# Patient Record
Sex: Male | Born: 1960 | Race: Black or African American | Hispanic: No | Marital: Single | Smoking: Current every day smoker
Health system: Southern US, Community
[De-identification: ages and names within clinical notes are randomized; demographics above are authoritative.]

## PROBLEM LIST (undated history)

## (undated) DIAGNOSIS — I639 Cerebral infarction, unspecified: Secondary | ICD-10-CM

## (undated) DIAGNOSIS — K219 Gastro-esophageal reflux disease without esophagitis: Secondary | ICD-10-CM

## (undated) DIAGNOSIS — F1910 Other psychoactive substance abuse, uncomplicated: Secondary | ICD-10-CM

## (undated) DIAGNOSIS — F419 Anxiety disorder, unspecified: Secondary | ICD-10-CM

## (undated) DIAGNOSIS — E669 Obesity, unspecified: Secondary | ICD-10-CM

## (undated) DIAGNOSIS — F32A Depression, unspecified: Secondary | ICD-10-CM

## (undated) HISTORY — PX: KNEE SURGERY: SHX244

---

## 2015-11-03 DIAGNOSIS — M1710 Unilateral primary osteoarthritis, unspecified knee: Secondary | ICD-10-CM | POA: Insufficient documentation

## 2017-08-24 ENCOUNTER — Emergency Department
Admission: EM | Admit: 2017-08-24 | Discharge: 2017-08-24 | Disposition: A | Discharge status: Home / Self Care | Payer: Self-pay | Attending: Emergency Medicine | Admitting: Emergency Medicine

## 2017-08-24 ENCOUNTER — Encounter: Payer: Self-pay | Admitting: Emergency Medicine

## 2017-08-24 ENCOUNTER — Other Ambulatory Visit: Payer: Self-pay

## 2017-08-24 DIAGNOSIS — F17200 Nicotine dependence, unspecified, uncomplicated: Secondary | ICD-10-CM | POA: Insufficient documentation

## 2017-08-24 DIAGNOSIS — H6641 Suppurative otitis media, unspecified, right ear: Secondary | ICD-10-CM | POA: Insufficient documentation

## 2017-08-24 MED ORDER — AMOXICILLIN 500 MG PO TABS: 500.0000 mg | ORAL_TABLET | Freq: Three times a day (TID) | ORAL | 0 refills | Status: AC

## 2017-08-24 NOTE — ED Notes (Signed)
See triage note.  Presents with bilateral ear pain  Pain is mainly worse on the right  No fever

## 2017-08-24 NOTE — ED Provider Notes (Signed)
[facility] Emergency Department Provider Note  ____________________________________________  Time seen: Approximately 6:01 PM  I have reviewed the triage vital signs and the nursing notes.   HISTORY  Chief Complaint Otalgia    HPI Samuel Molina is a 57 y.o. male presents to the emergency department with right otalgia.  Patient reports purulent drainage from right ear.  Patient has no pain elicited with palpation of the tragus.  Patient reports that approximately 10 days ago he finished a course of amoxicillin after being diagnosed with otitis media.  Patient reports that he does not remember the dosage of amoxicillin but conveys that he took amoxicillin twice a day.  He reports that otalgia and hearing loss initially improved but returned after amoxicillin completion.  No prior history of chronic otitis media.  Patient has been afebrile.  No alleviating measures have been attempted.   History reviewed. No pertinent past medical history.  There are no active problems to display for this patient.   Past Surgical History:  Procedure Laterality Date  . KNEE SURGERY Right     Prior to Admission medications   Medication Sig Start Date End Date Taking? Authorizing Provider  amoxicillin (AMOXIL) 500 MG tablet Take 1 tablet (500 mg total) by mouth 3 (three) times daily for 10 days. 08/24/17 09/03/17  Lannie Fields, PA-C    Allergies Tylenol [acetaminophen]  No family history on file.  Social History Social History   Tobacco Use  . Smoking status: Current Every Day Smoker  . Smokeless tobacco: Never Used  Substance Use Topics  . Alcohol use: Not Currently  . Drug use: Not Currently     Review of Systems  Constitutional: No fever/chills Eyes: No visual changes. No discharge ENT: Patient has right otalgia.  Cardiovascular: no chest pain. Respiratory: no cough. No SOB. Gastrointestinal: No abdominal pain.  No nausea, no vomiting.  No diarrhea.   No constipation. Musculoskeletal: Negative for musculoskeletal pain. Skin: Negative for rash, abrasions, lacerations, ecchymosis. Neurological: Negative for headaches, focal weakness or numbness.  ____________________________________________   PHYSICAL EXAM:  VITAL SIGNS: ED Triage Vitals [08/24/17 1619]  Enc Vitals Group     BP 128/69     Pulse Rate 71     Resp 16     Temp 98.2 F (36.8 C)     Temp Source Oral     SpO2 97 %     Weight 270 lb (122.5 kg)     Height 6\' 2"  (1.88 m)     Head Circumference      Peak Flow      Pain Score 8     Pain Loc      Pain Edu?      Excl. in [place]?      Constitutional: Alert and oriented. Well appearing and in no acute distress. Eyes: Conjunctivae are normal. PERRL. EOMI. Head: Atraumatic. ENT:      Ears: Right tympanic membrane has purulent exudate and perforation.  Left tympanic membrane is effused.      Nose: No congestion/rhinnorhea.      Mouth/Throat: Mucous membranes are moist.  Cardiovascular: Normal rate, regular rhythm. Normal S1 and S2.  Good peripheral circulation. Respiratory: Normal respiratory effort without tachypnea or retractions. Lungs CTAB. Good air entry to the bases with no decreased or absent breath sounds. Musculoskeletal: Full range of motion to all extremities. No gross deformities appreciated. Neurologic:  Normal speech and language. No gross focal neurologic deficits are appreciated.  Skin:  Skin is warm,  dry and intact. No rash noted.  ____________________________________________   LABS (all labs ordered are listed, but only abnormal results are displayed)  Labs Reviewed - No data to display ____________________________________________  EKG   ____________________________________________  RADIOLOGY   No results found.  ____________________________________________    PROCEDURES  Procedure(s) performed:    Procedures    Medications - No data to  display   ____________________________________________   INITIAL IMPRESSION / ASSESSMENT AND PLAN / ED COURSE  Pertinent labs & imaging results that were available during my care of the patient were reviewed by me and considered in my medical decision making (see chart for details).  Review of the [place] CSRS was performed in accordance of the [place] prior to dispensing any controlled drugs.     Assessment and Plan: Right otitis media Differential included otitis media versus middle ear effusion. Patient presents to the emergency department with right otalgia and perceived hearing loss.  On physical exam, patient's right tympanic membrane was erythematous and effused consistent with otitis media.  I am concerned that patient was underdosed with prior treatment with amoxicillin.  I suggested a course of Augmentin and patient conveyed that he would rather try a repeated treatment with amoxicillin at  an appropriate dose.  Vital signs are reassuring prior to discharge..  Patient was discharged with amoxicillin and advised to follow-up with primary care    ____________________________________________  FINAL CLINICAL IMPRESSION(S) / ED DIAGNOSES  Final diagnoses:  Suppurative otitis media of right ear, unspecified chronicity      NEW MEDICATIONS STARTED DURING THIS VISIT:  ED Discharge Orders        Ordered    amoxicillin (AMOXIL) 500 MG tablet  3 times daily     08/24/17 1756          This chart was dictated using voice recognition software/Dragon. Despite best efforts to proofread, errors can occur which can change the meaning. Any change was purely unintentional.    Lannie Fields, PA-C 08/24/17 Rosina Lowenstein, MD 08/24/17 [phone]

## 2017-08-24 NOTE — ED Triage Notes (Signed)
Pt to ED via POV c/o bilateral ear pain, right worse than left. Pt states that ears have been hurting for about 1 week but worse over the past few days. Pt left eye has been red and itchy the last 2-3 days. Pt denies nasal congestion.

## 2017-09-04 ENCOUNTER — Emergency Department
Admission: EM | Admit: 2017-09-04 | Discharge: 2017-09-04 | Disposition: A | Discharge status: Home / Self Care | Payer: Self-pay | Attending: Emergency Medicine | Admitting: Emergency Medicine

## 2017-09-04 ENCOUNTER — Emergency Department: Payer: Self-pay

## 2017-09-04 ENCOUNTER — Other Ambulatory Visit: Payer: Self-pay

## 2017-09-04 DIAGNOSIS — K2100 Gastro-esophageal reflux disease with esophagitis, without bleeding: Secondary | ICD-10-CM

## 2017-09-04 DIAGNOSIS — K920 Hematemesis: Secondary | ICD-10-CM | POA: Insufficient documentation

## 2017-09-04 DIAGNOSIS — F17200 Nicotine dependence, unspecified, uncomplicated: Secondary | ICD-10-CM | POA: Insufficient documentation

## 2017-09-04 DIAGNOSIS — K922 Gastrointestinal hemorrhage, unspecified: Secondary | ICD-10-CM | POA: Insufficient documentation

## 2017-09-04 HISTORY — DX: Obesity, unspecified: E66.9

## 2017-09-04 HISTORY — DX: Gastro-esophageal reflux disease without esophagitis: K21.9

## 2017-09-04 LAB — COMPREHENSIVE METABOLIC PANEL
ALT: 25 U/L (ref 17–63)
AST: 26 U/L (ref 15–41)
Albumin: 3.7 g/dL (ref 3.5–5.0)
Alkaline Phosphatase: 90 U/L (ref 38–126)
Anion gap: 6 (ref 5–15)
BUN: 15 mg/dL (ref 6–20)
CHLORIDE: 105 mmol/L (ref 101–111)
CO2: 24 mmol/L (ref 22–32)
CREATININE: 0.81 mg/dL (ref 0.61–1.24)
Calcium: 8.7 mg/dL — ABNORMAL LOW (ref 8.9–10.3)
GFR calc non Af Amer: >60 mL/min (ref >60)
Glucose, Bld: 161 mg/dL — ABNORMAL HIGH (ref 65–99)
Potassium: 3.7 mmol/L (ref 3.5–5.1)
SODIUM: 135 mmol/L (ref 135–145)
TOTAL PROTEIN: 7.2 g/dL (ref 6.5–8.1)
Total Bilirubin: 0.8 mg/dL (ref 0.3–1.2)

## 2017-09-04 LAB — URINALYSIS, COMPLETE (UACMP) WITH MICROSCOPIC
Bilirubin Urine: NEGATIVE
GLUCOSE, UA: NEGATIVE mg/dL
KETONES UR: NEGATIVE mg/dL
Leukocytes, UA: NEGATIVE
NITRITE: NEGATIVE
PH: 5 (ref 5.0–8.0)
Protein, ur: NEGATIVE mg/dL
SPECIFIC GRAVITY, URINE: 1.026 (ref 1.005–1.030)
Squamous Epithelial / LPF: NONE SEEN (ref 0–5)

## 2017-09-04 LAB — CBC
HCT: 44.8 % (ref 40.0–52.0)
Hemoglobin: 14.8 g/dL (ref 13.0–18.0)
MCH: 26.6 pg (ref 26.0–34.0)
MCHC: 32.9 g/dL (ref 32.0–36.0)
MCV: 80.9 fL (ref 80.0–100.0)
PLATELETS: 271 10*3/uL (ref 150–440)
RBC: 5.54 MIL/uL (ref 4.40–5.90)
RDW: 14.6 % — AB (ref 11.5–14.5)
WBC: 8.7 10*3/uL (ref 3.8–10.6)

## 2017-09-04 LAB — LIPASE, BLOOD: LIPASE: 30 U/L (ref 11–51)

## 2017-09-04 LAB — TROPONIN I: Troponin I: <0.03 ng/mL (ref <0.03)

## 2017-09-04 MED ORDER — GI COCKTAIL ~~LOC~~
30.0000 mL | Freq: Once | ORAL | Status: AC
Start: 2017-09-04 — End: 2017-09-04
  Administered 2017-09-04: 30 mL via ORAL
  Filled 2017-09-04: qty 30

## 2017-09-04 MED ORDER — ONDANSETRON 4 MG PO TBDP: ORAL_TABLET | ORAL | 0 refills | Status: DC

## 2017-09-04 MED ORDER — SUCRALFATE 1 G PO TABS: 1.0000 g | ORAL_TABLET | Freq: Four times a day (QID) | ORAL | 1 refills | Status: DC | PRN

## 2017-09-04 MED ORDER — OMEPRAZOLE MAGNESIUM 20 MG PO TBEC: 20.0000 mg | DELAYED_RELEASE_TABLET | Freq: Two times a day (BID) | ORAL | 2 refills | Status: DC

## 2017-09-04 NOTE — Discharge Instructions (Addendum)
As we discussed, your work-up was generally reassuring although he does appear that you have a small amount of blood passing through your stool, either from the small amount of vomiting blood that you did today or from the irritation and inflammation in your esophagus and/or stomach.  We recommend that he follow-up the next available opportunity with a GI doctor such as Dr. Vicente Males; I sent him a message through the computer so that he would know that you but you can still call the clinic and schedule the next available follow-up appointment.  He would likely benefit from an upper endoscopy where they look down your throat with a camera to evaluate directly how your esophagus looks.  Return to the emergency department if you develop new or worsening symptoms that concern you.

## 2017-09-04 NOTE — ED Provider Notes (Addendum)
[facility] Emergency Department Provider Note  ____________________________________________   First MD Initiated Contact with Patient 09/04/17 1651     (approximate)  I have reviewed the triage vital signs and the nursing notes.   HISTORY  Chief Complaint Gastroesophageal Reflux and Hematemesis    HPI Samuel Molina is a 57 y.o. male with medical history as listed below who reports suffering from acid reflux for years who presents for evaluation of persistent acid reflux, nightly vomiting, and today with acute onset of some bright red blood in the emesis.  He reports that for at least a week he has been vomiting 4 or 5 times a night.  He states that no matter how much he eats or what he eats he always vomits a relatively small amount of food but "a lot of acid".  He reports that nothing in particular makes his symptoms better or worse.  He saw a GI doctor when he lived in [place] but he has moved down here about a year and half ago and has no local doctor.  He used to take Prilosec several times a day but says that it did not work so he does not take any medication currently.  He is frustrated at the persistence of the symptoms and the severity of them but the thing that caused him to come in today was seeing some of what he assumed was blood in the emesis.  He denies fever/chills, chest pain, shortness of breath, lower abdominal pain, and dysuria.  He denies bright red blood in his stools and denies dark and tarry stools.  Past Medical History:  Diagnosis Date  . GERD (gastroesophageal reflux disease)   . Obesity     There are no active problems to display for this patient.   Past Surgical History:  Procedure Laterality Date  . KNEE SURGERY Right     Prior to Admission medications   Medication Sig Start Date End Date Taking? Authorizing Provider  omeprazole (PRILOSEC OTC) 20 MG tablet Take 1 tablet (20 mg total) by mouth 2 (two) times daily.  09/04/17 09/04/18  Hinda Kehr, MD  ondansetron (ZOFRAN ODT) 4 MG disintegrating tablet Allow 1-2 tablets to dissolve in your mouth every 8 hours as needed for nausea/vomiting 09/04/17   Hinda Kehr, MD  sucralfate (CARAFATE) 1 g tablet Take 1 tablet (1 g total) by mouth 4 (four) times daily as needed (for abdominal discomfort, nausea, and/or vomiting). 09/04/17   Hinda Kehr, MD    Allergies Tylenol [acetaminophen]  History reviewed. No pertinent family history.  Social History Social History   Tobacco Use  . Smoking status: Current Every Day Smoker  . Smokeless tobacco: Never Used  Substance Use Topics  . Alcohol use: Not Currently  . Drug use: Not Currently    Review of Systems Constitutional: No fever/chills Eyes: No visual changes. ENT: No sore throat. Cardiovascular: Denies chest pain. Respiratory: Denies shortness of breath. Gastrointestinal: About a week of nightly emesis (4-5 times) in the setting of years of acid reflux.  Acute onset of a small amount of bright red blood in the emesis today Genitourinary: Negative for dysuria. Musculoskeletal: Negative for neck pain.  Negative for back pain. Integumentary: Negative for rash. Neurological: Negative for headaches, focal weakness or numbness.   ____________________________________________   PHYSICAL EXAM:  VITAL SIGNS: ED Triage Vitals [09/04/17 1612]  Enc Vitals Group     BP 134/64     Pulse Rate 80     Resp  18     Temp 97.7 F (36.5 C)     Temp Source Oral     SpO2 98 %     Weight 122.5 kg (270 lb)     Height 1.88 m (6\' 2" )     Head Circumference      Peak Flow      Pain Score 8     Pain Loc      Pain Edu?      Excl. in [place]?     Constitutional: Alert and oriented. Well appearing and in no acute distress. Eyes: Conjunctivae are normal.  Head: Atraumatic. Nose: No congestion/rhinnorhea. Mouth/Throat: Mucous membranes are moist. Neck: No stridor.  No meningeal signs.   Cardiovascular: Normal  rate, regular rhythm. Good peripheral circulation. Grossly normal heart sounds. Respiratory: Normal respiratory effort.  No retractions. Lungs CTAB. Gastrointestinal: Obese.  Soft and nontender including in the epigastrium. No distention.   Rectal: Normal external exam.  Light brown stool present in rectal vault.  No gross blood, but Hemoccult was weakly positive. Musculoskeletal: No lower extremity tenderness nor edema. No gross deformities of extremities. Neurologic:  Normal speech and language. No gross focal neurologic deficits are appreciated.  Skin:  Skin is warm, dry and intact. No rash noted. Psychiatric: Mood and affect are normal. Speech and behavior are normal.  ____________________________________________   LABS (all labs ordered are listed, but only abnormal results are displayed)  Labs Reviewed  COMPREHENSIVE METABOLIC PANEL - Abnormal; Notable for the following components:      Result Value   Glucose, Bld 161 (*)    Calcium 8.7 (*)    All other components within normal limits  CBC - Abnormal; Notable for the following components:   RDW 14.6 (*)    All other components within normal limits  URINALYSIS, COMPLETE (UACMP) WITH MICROSCOPIC - Abnormal; Notable for the following components:   Color, Urine YELLOW (*)    APPearance CLEAR (*)    Hgb urine dipstick SMALL (*)    Bacteria, UA RARE (*)    All other components within normal limits  LIPASE, BLOOD  TROPONIN I   ____________________________________________  EKG  ED ECG REPORT I, Hinda Kehr, the attending physician, personally viewed and interpreted this ECG.  Date: 09/04/2017 EKG Time: 16: 18 Rate: 73 Rhythm: normal sinus rhythm QRS Axis: normal Intervals: Left anterior fascicular block ST/T Wave abnormalities: T wave inversions in leads V5, V6, II, and III. Narrative Interpretation: Does not meet STEMI criteria, doubt ACS   ____________________________________________  RADIOLOGY I, Hinda Kehr,  personally viewed and evaluated these images (plain radiographs) as part of my medical decision making, as well as reviewing the written report by the radiologist.  ED MD interpretation:  No acute abnormalities on acute abdomen series  Official radiology report(s): Dg Abdomen Acute W/chest  Result Date: 09/04/2017 CLINICAL DATA:  Esophageal pain with vomiting EXAM: DG ABDOMEN ACUTE W/ 1V CHEST COMPARISON:  None. FINDINGS: Single-view chest demonstrates no acute consolidation or effusion. Normal heart size. No pneumothorax. Supine and upright views of the abdomen demonstrate no free air beneath the diaphragm. Nonobstructed bowel-gas pattern with moderate stool. Probable phleboliths in the left pelvis IMPRESSION: 1. No radiographic evidence for acute cardiopulmonary abnormality. 2. Nonobstructed bowel-gas pattern Electronically Signed   By: Donavan Foil M.D.   On: 09/04/2017 18:20    ____________________________________________   PROCEDURES  Critical Care performed: No   Procedure(s) performed:   Procedures   ____________________________________________   INITIAL IMPRESSION / ASSESSMENT AND PLAN /  ED COURSE  As part of my medical decision making, I reviewed the following data within the [place] notes reviewed and incorporated, Labs reviewed , EKG interpreted , Radiograph reviewed  and Notes from prior ED visits    Differential diagnosis includes, but is not limited to, esophagitis, gastric or duodenal ulcers, SBO/ileus.  The patient has no abdominal tenderness to palpation which is reassuring.  Most of his symptoms are at night.  He had a small amount of blood in his emesis today in the setting of multiple episodes of emesis over the last week which most likely represents a Mallory-Weiss tear or esophagitis with some bleeding.  He has weakly positive stool but no gross blood per rectum.  I do not think a CT scan would be a useful study tonight.  He is also  having hiccups which he says is very common for him.  I will obtain an acute abdomen series of radiographs to look for any acute abnormalities, masses, evidence of air-fluid levels, etc., but I suspect it will be normal.  I tried to order a radiograph series of the esophagus but the radiologist is no longer available and has to be present in order to administer the contrast material.  I am giving the patient a GI cocktail which he says has worked in the past.  I suspect if we start him back on a PPI as well as Carafate and try to arrange close outpatient follow-up with a GI doctor such as Dr. Vicente Males, that would probably be the best thing for the patient because he will most likely need an upper endoscopy.  He agrees with the plan.  Clinical Course as of Sep 05 1847  Tue Sep 04, 2017  1842 The patient states he feels quite a bit better after the GI cocktail.  He is watching TV in no apparent discomfort.  His radiographs of chest and abdomen showed no evidence of any acute abnormalities.I discussed with him the need for outpatient follow-up and told him I was going to prescribe a couple different medications (Prilosec OTC twice daily and Carafate according to label instructions).  I encouraged him to follow-up with GI and will send to Dr. Vicente Males a message through [facility] to help facilitate follow-up.  I gave my usual and customary return precautions.  He understands and agrees with the plan.  Also providing a prescription for Zofran.   [CF]    Clinical Course User Index [CF] Hinda Kehr, MD    ____________________________________________  FINAL CLINICAL IMPRESSION(S) / ED DIAGNOSES  Final diagnoses:  Gastroesophageal reflux disease with esophagitis  Gastrointestinal hemorrhage, unspecified gastrointestinal hemorrhage type     MEDICATIONS GIVEN DURING THIS VISIT:  Medications  gi cocktail (Maalox,Lidocaine,Donnatal) (30 mLs Oral Given 09/04/17 1801)     ED Discharge Orders        Ordered     ondansetron (ZOFRAN ODT) 4 MG disintegrating tablet     09/04/17 1847    sucralfate (CARAFATE) 1 g tablet  4 times daily PRN     09/04/17 1847    omeprazole (PRILOSEC OTC) 20 MG tablet  2 times daily     09/04/17 1847       Note:  This document was prepared using Dragon voice recognition software and may include unintentional dictation errors.    Hinda Kehr, MD 09/04/17 1062    Hinda Kehr, MD 09/04/17 [phone]

## 2017-09-04 NOTE — ED Triage Notes (Signed)
To ER via POV c/o acid reflux. Hx of same. States that he had emesis today with bright red fluid in emesis.   Pt alert and oriented X4, active, cooperative, pt in NAD. RR even and unlabored, color WNL.

## 2017-09-19 ENCOUNTER — Ambulatory Visit: Payer: Self-pay | Admitting: Gastroenterology

## 2017-09-19 ENCOUNTER — Encounter: Payer: Self-pay | Admitting: Gastroenterology

## 2017-09-19 ENCOUNTER — Other Ambulatory Visit: Payer: Self-pay

## 2017-09-19 VITALS — BP 133/75 | HR 84 | Resp 17 | Ht 73.0 in | Wt 275.6 lb

## 2017-09-19 DIAGNOSIS — K219 Gastro-esophageal reflux disease without esophagitis: Secondary | ICD-10-CM

## 2017-09-19 MED ORDER — OMEPRAZOLE MAGNESIUM 20 MG PO TBEC: 40.0000 mg | DELAYED_RELEASE_TABLET | Freq: Two times a day (BID) | ORAL | 3 refills | Status: DC

## 2017-09-19 MED ORDER — SUCRALFATE 1 GM/10ML PO SUSP: 2.0000 g | Freq: Four times a day (QID) | ORAL | 1 refills | Status: DC

## 2017-09-19 NOTE — Patient Instructions (Addendum)
Food Choices for Gastroesophageal Reflux Disease, Adult When you have gastroesophageal reflux disease (GERD), the foods you eat and your eating habits are very important. Choosing the right foods can help ease the discomfort of GERD. Consider working with a diet and nutrition specialist (dietitian) to help you make healthy food choices. What general guidelines should I follow? Eating plan  Choose healthy foods low in fat, such as fruits, vegetables, whole grains, low-fat dairy products, and lean meat, fish, and poultry.  Eat frequent, small meals instead of three large meals each day. Eat your meals slowly, in a relaxed setting. Avoid bending over or lying down until 2-3 hours after eating.  Limit high-fat foods such as fatty meats or fried foods.  Limit your intake of oils, butter, and shortening to less than 8 teaspoons each day.  Avoid the following: ? Foods that cause symptoms. These may be different for different people. Keep a food diary to keep track of foods that cause symptoms. ? Alcohol. ? Drinking large amounts of liquid with meals. ? Eating meals during the 2-3 hours before bed.  Cook foods using methods other than frying. This may include baking, grilling, or broiling. Lifestyle   Maintain a healthy weight. Ask your health care provider what weight is healthy for you. If you need to lose weight, work with your health care provider to do so safely.  Exercise for at least 30 minutes on 5 or more days each week, or as told by your health care provider.  Avoid wearing clothes that fit tightly around your waist and chest.  Do not use any products that contain nicotine or tobacco, such as cigarettes and e-cigarettes. If you need help quitting, ask your health care provider.  Sleep with the head of your bed raised. Use a wedge under the mattress or blocks under the bed frame to raise the head of the bed. What foods are not recommended? The items listed may not be a complete  list. Talk with your dietitian about what dietary choices are best for you. Grains Pastries or quick breads with added fat. French toast. Vegetables Deep fried vegetables. French fries. Any vegetables prepared with added fat. Any vegetables that cause symptoms. For some people this may include tomatoes and tomato products, chili peppers, onions and garlic, and horseradish. Fruits Any fruits prepared with added fat. Any fruits that cause symptoms. For some people this may include citrus fruits, such as oranges, grapefruit, pineapple, and lemons. Meats and other protein foods High-fat meats, such as fatty beef or pork, hot dogs, ribs, ham, sausage, salami and bacon. Fried meat or protein, including fried fish and fried chicken. Nuts and nut butters. Dairy Whole milk and chocolate milk. Sour cream. Cream. Ice cream. Cream cheese. Milk shakes. Beverages Coffee and tea, with or without caffeine. Carbonated beverages. Sodas. Energy drinks. Fruit juice made with acidic fruits (such as orange or grapefruit). Tomato juice. Alcoholic drinks. Fats and oils Butter. Margarine. Shortening. Ghee. Sweets and desserts Chocolate and cocoa. Donuts. Seasoning and other foods Pepper. Peppermint and spearmint. Any condiments, herbs, or seasonings that cause symptoms. For some people, this may include curry, hot sauce, or vinegar-based salad dressings. Summary  When you have gastroesophageal reflux disease (GERD), food and lifestyle choices are very important to help ease the discomfort of GERD.  Eat frequent, small meals instead of three large meals each day. Eat your meals slowly, in a relaxed setting. Avoid bending over or lying down until 2-3 hours after eating.  Limit high-fat   foods such as fatty meat or fried foods. This information is not intended to replace advice given to you by your health care provider. Make sure you discuss any questions you have with your health care provider. Document Released:  04/10/2005 Document Revised: 04/11/2016 Document Reviewed: 04/11/2016 Elsevier Interactive Patient Education  2018 [employer].  Lifestyle and home remedies Lifestyle changes may help reduce the frequency of acid reflux. Try to: Maintain a healthy weight. Excess pounds put pressure on your abdomen, pushing up your stomach and causing acid to reflux into your esophagus.  Stop smoking. Smoking decreases the lower esophageal sphincter's ability to function properly.  Elevate the head of your bed. If you regularly experience heartburn while trying to sleep, place wood or cement blocks under the feet of your bed so that the head end is raised by 6 to 9 inches. If you can't elevate your bed, you can insert a wedge between your mattress and box spring to elevate your body from the waist up. Raising your head with additional pillows isn't effective.  Don't lie down after a meal. Wait at least three hours after eating before lying down or going to bed.  Eat food slowly and chew thoroughly. Put down your fork after every bite and pick it up again once you have chewed and swallowed that bite.  Avoid foods and drinks that trigger reflux. Common triggers include fatty or fried foods, tomato sauce, alcohol, chocolate, mint, garlic, onion, and caffeine.  Avoid tight-fitting clothing. Clothes that fit tightly around your waist put pressure on your abdomen and the lower esophageal sphincter.

## 2017-09-19 NOTE — Progress Notes (Signed)
Cephas Darby, MD [address]  [place]  [place], [place] [address]  Main: [phone]  Fax: [phone]    Gastroenterology Consultation  Referring Provider:     No ref. provider found Primary Care Physician:  Patient, No Pcp Per Primary Gastroenterologist:  Dr. Cephas Darby Reason for Consultation:     Regurgitation and acid reflux        HPI:   Samuel Molina is a 57 y.o. male referred from [facility] ER  for consultation & management of regurgitation and acid reflux. Patient reports that he has been experiencing severe burning pain in the chest associated with constant hiccups, regurgitation of acid, and unable to tolerate anything by mouth. Symptoms are worse after eating and when he lays down. He hasn't slept in last 72 hours because of these symptoms. He went to ER due to these symptoms as well as noticing some bright red blood in the emesis. His emesis is mostly acid and less amount of food. He was discharged on omeprazole 20 mg twice a day and sucralfate, Zofran. He reports that he had similar symptoms about one year ago and was seeing a gastroenterologist in [place]. Symptoms have recurred in last 1 month. He has gained about 40 pounds in last 10 months. He consumes red meat, fried foods regularly. He hasn't been taking any acid suppression medication in last 1 year until recently because he is asymptomatic. He also reports trouble swallowing. He smokes half pack of cigarettes per day since age of 82  He denies drinking alcohol, he works during nights for [employer]  His single, has children  in the ER troponin was negative, EKG normal, CBC, CMP, lipase were normal  NSAIDs: none  Antiplts/Anticoagulants/Anti thrombotics: none  GI Procedures: none He denies family history of GI malignancy  Past Medical History:  Diagnosis Date  . GERD (gastroesophageal reflux disease)   . Obesity     Past Surgical History:  Procedure Laterality  Date  . KNEE SURGERY Right     Current Outpatient Medications:  .  omeprazole (PRILOSEC OTC) 20 MG tablet, Take 2 tablets (40 mg total) by mouth 2 (two) times daily before a meal., Disp: 120 tablet, Rfl: 3 .  ondansetron (ZOFRAN ODT) 4 MG disintegrating tablet, Allow 1-2 tablets to dissolve in your mouth every 8 hours as needed for nausea/vomiting, Disp: 30 tablet, Rfl: 0 .  sucralfate (CARAFATE) 1 GM/10ML suspension, Take 20 mLs (2 g total) by mouth 4 (four) times daily., Disp: 420 mL, Rfl: 1   No family history on file.   Social History   Tobacco Use  . Smoking status: Current Every Day Smoker  . Smokeless tobacco: Never Used  Substance Use Topics  . Alcohol use: Not Currently  . Drug use: Not Currently    Allergies as of 09/19/2017 - Review Complete 09/19/2017  Allergen Reaction Noted  . Tylenol [acetaminophen] Rash 08/24/2017    Review of Systems:    All systems reviewed and negative except where noted in HPI.   Physical Exam:  BP 133/75 (BP Location: Left Arm, Patient Position: Sitting, Cuff Size: Large)   Pulse 84   Resp 17   Ht 6\' 1"  (1.854 m)   Wt 275 lb 9.6 oz (125 kg)   BMI 36.36 kg/m  No LMP for male patient.  General:   Alert,  Well-developed, well-nourished, pleasant and cooperative in NAD Head:  Normocephalic and atraumatic. Eyes:  Sclera clear, no icterus.  Conjunctiva pink. Ears:  Normal auditory acuity. Nose:  No deformity, discharge, or lesions. Mouth:  No deformity or lesions,oropharynx pink & moist. Neck:  Supple; no masses or thyromegaly. Lungs:  Respirations even and unlabored.  Clear throughout to auscultation.   No wheezes, crackles, or rhonchi. No acute distress. Heart:  Regular rate and rhythm; no murmurs, clicks, rubs, or gallops. Abdomen:  Normal bowel sounds. Soft, obese, non-tender and non-distended without masses, hepatosplenomegaly or hernias noted.  No guarding or rebound tenderness.   Rectal: Not performed Msk:  Symmetrical without  gross deformities. Good, equal movement & strength bilaterally. Pulses:  Normal pulses noted. Extremities:  No clubbing or edema.  No cyanosis. Neurologic:  Alert and oriented x3;  grossly normal neurologically. Skin:  Intact without significant lesions or rashes. No jaundice. Lymph Nodes:  No significant cervical adenopathy. Psych:  Alert and cooperative. Normal mood and affect.  Imaging Studies: X-ray abdomen normal  Assessment and Plan:   [name] is a 57 y.o. African-American male with morbid obesity, with typical symptoms of GERD, worse in last 1 month  - Omeprazole 40 mg twice a day - Educated him about antireflux measures including lifestyle changes, elevate HOB, weight loss, avoiding red meat and other high carbohydrate foods, quit smoking - EGD tomorrow   Follow up in 2 weeks   Cephas Darby, MD

## 2017-09-20 ENCOUNTER — Ambulatory Visit: Payer: Self-pay | Admitting: Anesthesiology

## 2017-09-20 ENCOUNTER — Ambulatory Visit
Admission: RE | Admit: 2017-09-20 | Discharge: 2017-09-20 | Disposition: A | Discharge status: Home / Self Care | Payer: Self-pay | Source: Ambulatory Visit | Attending: Gastroenterology | Admitting: Gastroenterology

## 2017-09-20 ENCOUNTER — Encounter
Admission: RE | Disposition: A | Discharge status: Home / Self Care | Payer: Self-pay | Source: Ambulatory Visit | Attending: Gastroenterology

## 2017-09-20 ENCOUNTER — Encounter: Payer: Self-pay | Admitting: *Deleted

## 2017-09-20 DIAGNOSIS — K2950 Unspecified chronic gastritis without bleeding: Secondary | ICD-10-CM | POA: Insufficient documentation

## 2017-09-20 DIAGNOSIS — K2100 Gastro-esophageal reflux disease with esophagitis, without bleeding: Secondary | ICD-10-CM

## 2017-09-20 DIAGNOSIS — E669 Obesity, unspecified: Secondary | ICD-10-CM | POA: Insufficient documentation

## 2017-09-20 DIAGNOSIS — F17210 Nicotine dependence, cigarettes, uncomplicated: Secondary | ICD-10-CM | POA: Insufficient documentation

## 2017-09-20 DIAGNOSIS — Z886 Allergy status to analgesic agent status: Secondary | ICD-10-CM | POA: Insufficient documentation

## 2017-09-20 DIAGNOSIS — Z79899 Other long term (current) drug therapy: Secondary | ICD-10-CM | POA: Insufficient documentation

## 2017-09-20 DIAGNOSIS — Z6835 Body mass index (BMI) 35.0-35.9, adult: Secondary | ICD-10-CM | POA: Insufficient documentation

## 2017-09-20 DIAGNOSIS — K3189 Other diseases of stomach and duodenum: Secondary | ICD-10-CM | POA: Insufficient documentation

## 2017-09-20 DIAGNOSIS — R12 Heartburn: Secondary | ICD-10-CM | POA: Insufficient documentation

## 2017-09-20 DIAGNOSIS — B9681 Helicobacter pylori [H. pylori] as the cause of diseases classified elsewhere: Secondary | ICD-10-CM | POA: Insufficient documentation

## 2017-09-20 DIAGNOSIS — K228 Other specified diseases of esophagus: Secondary | ICD-10-CM | POA: Insufficient documentation

## 2017-09-20 DIAGNOSIS — K219 Gastro-esophageal reflux disease without esophagitis: Secondary | ICD-10-CM

## 2017-09-20 HISTORY — PX: ESOPHAGOGASTRODUODENOSCOPY (EGD) WITH PROPOFOL: SHX5813

## 2017-09-20 SURGERY — ESOPHAGOGASTRODUODENOSCOPY (EGD) WITH PROPOFOL
Anesthesia: General

## 2017-09-20 MED ORDER — IPRATROPIUM-ALBUTEROL 0.5-2.5 (3) MG/3ML IN SOLN
3.0000 mL | Freq: Once | RESPIRATORY_TRACT | Status: AC
  Administered 2017-09-20: 3 mL via RESPIRATORY_TRACT

## 2017-09-20 MED ORDER — LIDOCAINE HCL (PF) 2 % IJ SOLN
INTRAMUSCULAR | Status: AC
  Filled 2017-09-20: qty 10

## 2017-09-20 MED ORDER — IPRATROPIUM-ALBUTEROL 0.5-2.5 (3) MG/3ML IN SOLN
RESPIRATORY_TRACT | Status: AC
  Administered 2017-09-20: 3 mL via RESPIRATORY_TRACT
  Filled 2017-09-20: qty 3

## 2017-09-20 MED ORDER — SODIUM CHLORIDE 0.9 % IV SOLN
INTRAVENOUS | Status: DC
  Administered 2017-09-20: 10:00:00 via INTRAVENOUS

## 2017-09-20 MED ORDER — GLYCOPYRROLATE 0.2 MG/ML IJ SOLN
INTRAMUSCULAR | Status: AC
  Filled 2017-09-20: qty 1

## 2017-09-20 MED ORDER — PROPOFOL 500 MG/50ML IV EMUL
INTRAVENOUS | Status: AC
Start: 2017-09-20 — End: ?
  Filled 2017-09-20: qty 50

## 2017-09-20 MED ORDER — PROPOFOL 500 MG/50ML IV EMUL
INTRAVENOUS | Status: DC | PRN
  Administered 2017-09-20: 100 ug/kg/min via INTRAVENOUS

## 2017-09-20 MED ORDER — PROPOFOL 10 MG/ML IV BOLUS
INTRAVENOUS | Status: DC | PRN
  Administered 2017-09-20: 150 mg via INTRAVENOUS

## 2017-09-20 MED ORDER — GLYCOPYRROLATE 0.2 MG/ML IJ SOLN
INTRAMUSCULAR | Status: DC | PRN
  Administered 2017-09-20: 0.1 mg via INTRAVENOUS

## 2017-09-20 MED ORDER — LIDOCAINE HCL (CARDIAC) PF 100 MG/5ML IV SOSY
PREFILLED_SYRINGE | INTRAVENOUS | Status: DC | PRN
  Administered 2017-09-20 (×2): 50 mg via INTRAVENOUS

## 2017-09-20 NOTE — Anesthesia Postprocedure Evaluation (Signed)
Anesthesia Post Note  Patient: Avraj Lindroth  Procedure(s) Performed: ESOPHAGOGASTRODUODENOSCOPY (EGD) WITH PROPOFOL (N/A )  Patient location during evaluation: Endoscopy Anesthesia Type: General Level of consciousness: awake and alert, oriented and patient cooperative Pain management: satisfactory to patient Vital Signs Assessment: post-procedure vital signs reviewed and stable Respiratory status: spontaneous breathing and respiratory function stable Cardiovascular status: blood pressure returned to baseline and stable Postop Assessment: no headache, no backache, patient able to bend at knees, no apparent nausea or vomiting, adequate PO intake and able to ambulate Anesthetic complications: no     Last Vitals:  Vitals:   09/20/17 0956 09/20/17 1110  BP: (!) 125/94 107/69  Pulse: 74 74  Resp: 20 20  Temp: (!) 36.2 C (!) 36.1 C  SpO2: 100% 100%    Last Pain:  Vitals:   09/20/17 1110  TempSrc: Tympanic  PainSc: Asleep                 Myndi Wamble H Maxime Beckner

## 2017-09-20 NOTE — Transfer of Care (Signed)
Immediate Anesthesia Transfer of Care Note  Patient: Samuel Molina  Procedure(s) Performed: ESOPHAGOGASTRODUODENOSCOPY (EGD) WITH PROPOFOL (N/A )  Patient Location: Endoscopy Unit  Anesthesia Type:General  Level of Consciousness: awake, alert , oriented and patient cooperative  Airway & Oxygen Therapy: Patient Spontanous Breathing and Patient connected to nasal cannula oxygen  Post-op Assessment: Report given to RN, Post -op Vital signs reviewed and stable and Patient moving all extremities  Post vital signs: Reviewed and stable  Last Vitals:  Vitals Value Taken Time  BP 107/69 09/20/2017 11:11 AM  Temp    Pulse 61 09/20/2017 11:13 AM  Resp 10 09/20/2017 11:13 AM  SpO2 99 % 09/20/2017 11:13 AM  Vitals shown include unvalidated device data.  Last Pain:  Vitals:   09/20/17 1110  TempSrc: (P) Tympanic  PainSc:          Complications: No apparent anesthesia complications

## 2017-09-20 NOTE — Op Note (Signed)
[facility] Gastroenterology Patient Name: Samuel Molina Procedure Date: 09/20/2017 10:46 AM MRN: [id number] Account #: [id number] Date of Birth: [birth year] Admit Type: Outpatient Age: 57 Room: [facility] ENDO ROOM 2 Gender: Male Note Status: Finalized Procedure:            Upper GI endoscopy Indications:          Heartburn, Suspected gastro-esophageal reflux disease Providers:            Lin Landsman MD, MD Referring MD:         No Local Md, MD (Referring MD) Medicines:            Monitored Anesthesia Care Complications:        No immediate complications. Estimated blood loss: None. Procedure:            Pre-Anesthesia Assessment:                       - Prior to the procedure, a History and Physical was                        performed, and patient medications and allergies were                        reviewed. The patient is competent. The risks and                        benefits of the procedure and the sedation options and                        risks were discussed with the patient. All questions                        were answered and informed consent was obtained.                        Patient identification and proposed procedure were                        verified by the physician, the nurse, the                        anesthesiologist, the anesthetist and the technician in                        the pre-procedure area in the procedure room in the                        endoscopy suite. Mental Status Examination: alert and                        oriented. Airway Examination: normal oropharyngeal                        airway and neck mobility. Respiratory Examination:                        clear to auscultation. CV Examination: normal.                        Prophylactic Antibiotics: The patient does not require  prophylactic antibiotics. Prior Anticoagulants: The                        patient has taken no previous  anticoagulant or                        antiplatelet agents. ASA Grade Assessment: III - A                        patient with severe systemic disease. After reviewing                        the risks and benefits, the patient was deemed in                        satisfactory condition to undergo the procedure. The                        anesthesia plan was to use monitored anesthesia care                        (MAC). Immediately prior to administration of                        medications, the patient was re-assessed for adequacy                        to receive sedatives. The heart rate, respiratory rate,                        oxygen saturations, blood pressure, adequacy of                        pulmonary ventilation, and response to care were                        monitored throughout the procedure. The physical status                        of the patient was re-assessed after the procedure.                       After obtaining informed consent, the endoscope was                        passed under direct vision. Throughout the procedure,                        the patient's blood pressure, pulse, and oxygen                        saturations were monitored continuously. The                        Colonoscope was introduced through the mouth, and                        advanced to the second part of duodenum. The upper GI  endoscopy was accomplished without difficulty. The                        patient tolerated the procedure well. Findings:      The duodenal bulb and second portion of the duodenum were normal.      The entire examined stomach was normal. Biopsies were taken with a cold       forceps for Helicobacter pylori testing.      LA Grade B (one or more mucosal breaks greater than 5 mm, not extending       between the tops of two mucosal folds) esophagitis with no bleeding was       found in the lower third of the esophagus.      The Z-line was  irregular. Impression:           - Normal duodenal bulb and second portion of the                        duodenum.                       - Normal stomach. Biopsied.                       - LA Grade B reflux esophagitis.                       - Z-line irregular. Recommendation:       - Await pathology results.                       - Discharge patient to home.                       - High fiber diet and low fat diet.                       - Follow an antireflux regimen.                       - Use Prilosec (omeprazole) 40 mg PO BID for 3 months.                       - Return to my office as previously scheduled. Procedure Code(s):    --- Professional ---                       [phone], Esophagogastroduodenoscopy, flexible, transoral;                        with biopsy, single or multiple Diagnosis Code(s):    --- Professional ---                       K21.0, Gastro-esophageal reflux disease with esophagitis                       K22.8, Other specified diseases of esophagus                       R12, Heartburn CPT copyright 2017 American Medical Association. All rights reserved. The codes documented in this report are preliminary and upon coder review may  be revised to meet current compliance  requirements. Dr. Ulyess Mort Lin Landsman MD, MD 09/20/2017 11:11:02 AM This report has been signed electronically. Number of Addenda: 0 Note Initiated On: 09/20/2017 10:46 AM      [facility]

## 2017-09-20 NOTE — Anesthesia Preprocedure Evaluation (Signed)
Anesthesia Evaluation  Patient identified by MRN, date of birth, ID band Patient awake    Reviewed: Allergy & Precautions, H&P , NPO status , Patient's Chart, lab work & pertinent test results  History of Anesthesia Complications Negative for: history of anesthetic complications  Airway Mallampati: III  TM Distance: <3 FB Neck ROM: full    Dental  (+) Chipped, Poor Dentition, Missing   Pulmonary neg shortness of breath, Current Smoker,  Signs and symptoms suggestive of sleep apnea            Cardiovascular Exercise Tolerance: Good (-) angina(-) Past MI negative cardio ROS       Neuro/Psych negative neurological ROS  negative psych ROS   GI/Hepatic Neg liver ROS, GERD  Medicated and Controlled,  Endo/Other  negative endocrine ROS  Renal/GU negative Renal ROS  negative genitourinary   Musculoskeletal   Abdominal   Peds  Hematology negative hematology ROS (+)   Anesthesia Other Findings Past Medical History: No date: GERD (gastroesophageal reflux disease) No date: Obesity  Past Surgical History: No date: KNEE SURGERY; Right  BMI    Body Mass Index:  35.89 kg/m      Reproductive/Obstetrics negative OB ROS                             Anesthesia Physical Anesthesia Plan  ASA: III  Anesthesia Plan: General   Post-op Pain Management:    Induction: Intravenous  PONV Risk Score and Plan: Propofol infusion and TIVA  Airway Management Planned: Natural Airway and Nasal Cannula  Additional Equipment:   Intra-op Plan:   Post-operative Plan:   Informed Consent: I have reviewed the patients History and Physical, chart, labs and discussed the procedure including the risks, benefits and alternatives for the proposed anesthesia with the patient or authorized representative who has indicated his/her understanding and acceptance.   Dental Advisory Given  Plan Discussed with:  Anesthesiologist, CRNA and Surgeon  Anesthesia Plan Comments: (Patient consented for risks of anesthesia including but not limited to:  - adverse reactions to medications - risk of intubation if required - damage to teeth, lips or other oral mucosa - sore throat or hoarseness - Damage to heart, brain, lungs or loss of life  Patient voiced understanding.)        Anesthesia Quick Evaluation

## 2017-09-20 NOTE — Anesthesia Post-op Follow-up Note (Signed)
Anesthesia QCDR form completed.        

## 2017-09-20 NOTE — H&P (Signed)
Samuel Darby, MD [address]  [place]  [place], [place] [address]  Main: [phone]  Fax: [phone] Pager: [phone]  Primary Care Physician:  Patient, No Pcp Per Primary Gastroenterologist:  Dr. Cephas Molina  Pre-Procedure History & Physical: HPI:  Samuel Molina is a 57 y.o. male is here for an endoscopy.   Past Medical History:  Diagnosis Date  . GERD (gastroesophageal reflux disease)   . Obesity     Past Surgical History:  Procedure Laterality Date  . KNEE SURGERY Right     Prior to Admission medications   Medication Sig Start Date End Date Taking? Authorizing Provider  omeprazole (PRILOSEC OTC) 20 MG tablet Take 2 tablets (40 mg total) by mouth 2 (two) times daily before a meal. 09/19/17 01/17/18 Yes Vanga, Tally Due, MD  ondansetron (ZOFRAN ODT) 4 MG disintegrating tablet Allow 1-2 tablets to dissolve in your mouth every 8 hours as needed for nausea/vomiting 09/04/17  Yes Hinda Kehr, MD  sucralfate (CARAFATE) 1 GM/10ML suspension Take 20 mLs (2 g total) by mouth 4 (four) times daily. Patient not taking: Reported on 09/20/2017 09/19/17   Lin Landsman, MD    Allergies as of 09/19/2017 - Review Complete 09/19/2017  Allergen Reaction Noted  . Tylenol [acetaminophen] Rash 08/24/2017    History reviewed. No pertinent family history.  Social History   Socioeconomic History  . Marital status: Single    Spouse name: Not on file  . Number of children: Not on file  . Years of education: Not on file  . Highest education level: Not on file  Occupational History  . Not on file  Social Needs  . Financial resource strain: Not on file  . Food insecurity:    Worry: Not on file    Inability: Not on file  . Transportation needs:    Medical: Not on file    Non-medical: Not on file  Tobacco Use  . Smoking status: Current Every Day Smoker    Packs/day: 0.50    Types: Cigarettes  . Smokeless tobacco: Never Used  Substance and Sexual  Activity  . Alcohol use: Not Currently  . Drug use: Never  . Sexual activity: Not on file  Lifestyle  . Physical activity:    Days per week: Not on file    Minutes per session: Not on file  . Stress: Not on file  Relationships  . Social connections:    Talks on phone: Not on file    Gets together: Not on file    Attends religious service: Not on file    Active member of club or organization: Not on file    Attends meetings of clubs or organizations: Not on file    Relationship status: Not on file  . Intimate partner violence:    Fear of current or ex partner: Not on file    Emotionally abused: Not on file    Physically abused: Not on file    Forced sexual activity: Not on file  Other Topics Concern  . Not on file  Social History Narrative  . Not on file    Review of Systems: See HPI, otherwise negative ROS  Physical Exam: BP (!) 125/94   Pulse 74   Temp (!) 97.2 F (36.2 C) (Tympanic)   Resp 20   Ht 6\' 1"  (1.854 m)   Wt 272 lb (123.4 kg)   SpO2 100%   BMI 35.89 kg/m  General:   Alert,  pleasant and cooperative  in NAD Head:  Normocephalic and atraumatic. Neck:  Supple; no masses or thyromegaly. Lungs:  Clear throughout to auscultation.    Heart:  Regular rate and rhythm. Abdomen:  Soft, nontender and nondistended. Normal bowel sounds, without guarding, and without rebound.   Neurologic:  Alert and  oriented x4;  grossly normal neurologically.  Impression/Plan: [name] is here for an endoscopy to be performed for gerd  Risks, benefits, limitations, and alternatives regarding  endoscopy have been reviewed with the patient.  Questions have been answered.  All parties agreeable.   Sherri Sear, MD  09/20/2017, 10:13 AM

## 2017-09-21 ENCOUNTER — Other Ambulatory Visit: Payer: Self-pay

## 2017-09-21 DIAGNOSIS — A048 Other specified bacterial intestinal infections: Secondary | ICD-10-CM

## 2017-09-21 DIAGNOSIS — K219 Gastro-esophageal reflux disease without esophagitis: Secondary | ICD-10-CM

## 2017-09-21 LAB — SURGICAL PATHOLOGY

## 2017-09-21 MED ORDER — CLARITHROMYCIN 250 MG PO TABS: 250.0000 mg | ORAL_TABLET | Freq: Two times a day (BID) | ORAL | 0 refills | Status: AC

## 2017-09-21 MED ORDER — AMOXICILLIN 500 MG PO CAPS: 500.0000 mg | ORAL_CAPSULE | Freq: Two times a day (BID) | ORAL | 0 refills | Status: AC

## 2017-09-21 MED ORDER — OMEPRAZOLE 40 MG PO CPDR: 40.0000 mg | DELAYED_RELEASE_CAPSULE | Freq: Two times a day (BID) | ORAL | 0 refills | Status: DC

## 2017-10-16 ENCOUNTER — Ambulatory Visit: Payer: Self-pay | Admitting: Gastroenterology

## 2017-10-18 ENCOUNTER — Ambulatory Visit: Admission: RE | Admit: 2017-10-18 | Payer: Self-pay | Source: Ambulatory Visit | Admitting: Gastroenterology

## 2017-10-18 ENCOUNTER — Encounter: Admission: RE | Payer: Self-pay | Source: Ambulatory Visit

## 2017-10-18 SURGERY — ESOPHAGOGASTRODUODENOSCOPY (EGD) WITH PROPOFOL
Anesthesia: General

## 2018-04-17 ENCOUNTER — Emergency Department: Payer: Self-pay

## 2018-04-17 ENCOUNTER — Encounter: Payer: Self-pay | Admitting: Emergency Medicine

## 2018-04-17 ENCOUNTER — Emergency Department
Admission: EM | Admit: 2018-04-17 | Discharge: 2018-04-17 | Disposition: A | Discharge status: Home / Self Care | Payer: Self-pay | Attending: Emergency Medicine | Admitting: Emergency Medicine

## 2018-04-17 ENCOUNTER — Other Ambulatory Visit: Payer: Self-pay

## 2018-04-17 DIAGNOSIS — W010XXA Fall on same level from slipping, tripping and stumbling without subsequent striking against object, initial encounter: Secondary | ICD-10-CM | POA: Insufficient documentation

## 2018-04-17 DIAGNOSIS — Y929 Unspecified place or not applicable: Secondary | ICD-10-CM | POA: Insufficient documentation

## 2018-04-17 DIAGNOSIS — F17210 Nicotine dependence, cigarettes, uncomplicated: Secondary | ICD-10-CM | POA: Insufficient documentation

## 2018-04-17 DIAGNOSIS — Y9301 Activity, walking, marching and hiking: Secondary | ICD-10-CM | POA: Insufficient documentation

## 2018-04-17 DIAGNOSIS — R2241 Localized swelling, mass and lump, right lower limb: Secondary | ICD-10-CM | POA: Insufficient documentation

## 2018-04-17 DIAGNOSIS — Y998 Other external cause status: Secondary | ICD-10-CM | POA: Insufficient documentation

## 2018-04-17 DIAGNOSIS — S8391XA Sprain of unspecified site of right knee, initial encounter: Secondary | ICD-10-CM | POA: Insufficient documentation

## 2018-04-17 MED ORDER — DOCUSATE SODIUM 100 MG PO CAPS: ORAL_CAPSULE | ORAL | 0 refills | Status: DC

## 2018-04-17 MED ORDER — IBUPROFEN 600 MG PO TABS
600.0000 mg | ORAL_TABLET | Freq: Once | ORAL | Status: AC
Start: 2018-04-17 — End: 2018-04-17
  Administered 2018-04-17: 600 mg via ORAL
  Filled 2018-04-17: qty 1

## 2018-04-17 MED ORDER — OXYCODONE HCL 5 MG PO TABS: 5.0000 mg | ORAL_TABLET | Freq: Three times a day (TID) | ORAL | 0 refills | Status: DC | PRN

## 2018-04-17 MED ORDER — OXYCODONE HCL 5 MG PO TABS
10.0000 mg | ORAL_TABLET | ORAL | Status: AC
  Administered 2018-04-17: 10 mg via ORAL
  Filled 2018-04-17: qty 2

## 2018-04-17 NOTE — Discharge Instructions (Addendum)
As we discussed, your knee x-rays do not show any sign of acute fracture or dislocation.  You do have some chronic arthritis and you likely strained or sprained at least one ligament in your knee when you fell.  We offered a knee immobilizer but you past, so we recommend that you keep your knee wrapped with an Ace wrap and read through the instructions included in this paperwork regarding RICE therapy (rest, ice, compression, elevation).  Use over-the-counter ibuprofen according to label instructions.  Take Percocet as prescribed for severe pain. Do not drink alcohol, drive or participate in any other potentially dangerous activities while taking this medication as it may make you sleepy. Do not take this medication with any other sedating medications, either prescription or over-the-counter. If you were prescribed Percocet or Vicodin, do not take these with acetaminophen (Tylenol) as it is already contained within these medications.   This medication is an opiate (or narcotic) pain medication and can be habit forming.  Use it as little as possible to achieve adequate pain control.  Do not use or use it with extreme caution if you have a history of opiate abuse or dependence.  If you are on a pain contract with your primary care doctor or a pain specialist, be sure to let them know you were prescribed this medication today from the [facility] Emergency Department.  This medication is intended for your use only - do not give any to anyone else and keep it in a secure place where nobody else, especially children, have access to it.  It will also cause or worsen constipation, so you may want to consider taking an over-the-counter stool softener while you are taking this medication.    Return to the emergency department if you develop new or worsening symptoms that concern you.

## 2018-04-17 NOTE — ED Provider Notes (Signed)
[facility] Emergency Department Provider Note  ____________________________________________   First MD Initiated Contact with Patient 04/17/18 [phone]     (approximate)  I have reviewed the triage vital signs and the nursing notes.   HISTORY  Chief Complaint Knee Pain    HPI Samuel Molina is a 57 y.o. male with medical history as listed below who notably reports prior knee surgery to ligaments on his right knee.  He presents tonight with acute onset severe pain with flexion extension of his right knee as well as with weightbearing.  He states that he tripped tonight and the pain happened after the fall.  He is a bit vague about how exactly he tripped and fell and whether it was a twisting injury or whether he landed on the knee.  He reports some minor swelling but severe pain and nothing in particular makes it better except holding absolutely still.  He denies any other injuries and did not strike his head or develop any neck pain after the fall.  He denies chest pain, shortness of breath, nausea, vomiting, and abdominal pain.  Past Medical History:  Diagnosis Date  . GERD (gastroesophageal reflux disease)   . Obesity     Patient Active Problem List   Diagnosis Date Noted  . Gastroesophageal reflux disease with esophagitis   . Morbid obesity ([place]) 09/19/2017    Past Surgical History:  Procedure Laterality Date  . ESOPHAGOGASTRODUODENOSCOPY (EGD) WITH PROPOFOL N/A 09/20/2017   Procedure: ESOPHAGOGASTRODUODENOSCOPY (EGD) WITH PROPOFOL;  Surgeon: Lin Landsman, MD;  Location: [facility];  Service: Gastroenterology;  Laterality: N/A;  . KNEE SURGERY Right     Prior to Admission medications   Medication Sig Start Date End Date Taking? Authorizing Provider  docusate sodium (COLACE) 100 MG capsule Take 1 tablet once or twice daily as needed for constipation while taking narcotic pain medicine 04/17/18   Hinda Kehr, MD  omeprazole (PRILOSEC  OTC) 20 MG tablet Take 2 tablets (40 mg total) by mouth 2 (two) times daily before a meal. 09/19/17 01/17/18  Vanga, Tally Due, MD  omeprazole (PRILOSEC) 40 MG capsule Take 1 capsule (40 mg total) by mouth 2 (two) times daily for 14 days. 09/21/17 10/05/17  Lin Landsman, MD  ondansetron (ZOFRAN ODT) 4 MG disintegrating tablet Allow 1-2 tablets to dissolve in your mouth every 8 hours as needed for nausea/vomiting 09/04/17   Hinda Kehr, MD  oxyCODONE (ROXICODONE) 5 MG immediate release tablet Take 1 tablet (5 mg total) by mouth every 8 (eight) hours as needed. 04/17/18 04/17/19  Hinda Kehr, MD    Allergies Tylenol [acetaminophen]  History reviewed. No pertinent family history.  Social History Social History   Tobacco Use  . Smoking status: Current Every Day Smoker    Packs/day: 0.50    Types: Cigarettes  . Smokeless tobacco: Never Used  Substance Use Topics  . Alcohol use: Not Currently  . Drug use: Never    Review of Systems Constitutional: No fever/chills Cardiovascular: Denies chest pain. Respiratory: Denies shortness of breath. Gastrointestinal: No abdominal pain.  No nausea, no vomiting.   Musculoskeletal: Acute onset pain in right knee as described above. Integumentary: Negative for laceration. Neurological: Negative for headaches, focal weakness or numbness.   ____________________________________________   PHYSICAL EXAM:  VITAL SIGNS: ED Triage Vitals  Enc Vitals Group     BP 04/17/18 0044 135/81     Pulse Rate 04/17/18 0044 82     Resp 04/17/18 0044 18  Temp 04/17/18 0044 98.2 F (36.8 C)     Temp Source 04/17/18 0044 Oral     SpO2 04/17/18 0044 98 %     Weight 04/17/18 0041 125.2 kg (276 lb)     Height 04/17/18 0041 1.854 m (6\' 1" )     Head Circumference --      Peak Flow --      Pain Score 04/17/18 0041 10     Pain Loc --      Pain Edu? --      Excl. in [place]? --     Constitutional: Alert and oriented. Well appearing and in no acute  distress. Eyes: Conjunctivae are normal.  Head: Atraumatic. Cardiovascular: Normal rate, regular rhythm. Good peripheral circulation. Respiratory: Normal respiratory effort.  No retractions.  Gastrointestinal: Soft and nontender. No distention.  Musculoskeletal: No gross deformity of the right knee.  No appreciable effusion when compared to the left although the patient claims that there is a small effusion on the medial aspect of the patella.  The patella is feels normal to palpation and it is difficult to identify exactly where the patient's pain arises.  It is not reproducible with palpation all around the patella but when I attempt to flex or extend the leg he reports severe pain.  There is no obvious joint laxity but the exam is limited by patient cooperation and pain. Neurologic:  Normal speech and language. No gross focal neurologic deficits are appreciated.  Skin:  Skin is warm, dry and intact. No rash noted. Psychiatric: Mood and affect are normal. Speech and behavior are normal.  ____________________________________________   LABS (all labs ordered are listed, but only abnormal results are displayed)  Labs Reviewed - No data to display ____________________________________________  EKG  No indication for EKG ____________________________________________  RADIOLOGY I, Hinda Kehr, personally discussed these images and results by phone with the on-call radiologist and used this discussion as part of my medical decision making.   ED MD interpretation: No indication of acute fracture or dislocation.  Official radiology report(s): Dg Knee Complete 4 Views Right  Result Date: 04/17/2018 CLINICAL DATA:  Status post fall, with acute onset of right knee pain. Initial encounter. EXAM: RIGHT KNEE - COMPLETE 4+ VIEW COMPARISON:  None. FINDINGS: There is no evidence of fracture or dislocation. The joint spaces are preserved. Marginal osteophyte formation is noted at all 3 compartments.  Tibial spine and wall osteophytes are noted. No significant joint effusion is seen. The visualized soft tissues are normal in appearance. IMPRESSION: 1. No evidence of fracture or dislocation. 2. Mild tricompartmental osteoarthritis noted. Electronically Signed   By: Garald Balding M.D.   On: 04/17/2018 01:28    ____________________________________________   PROCEDURES  Critical Care performed: No   Procedure(s) performed:   Procedures   ____________________________________________   INITIAL IMPRESSION / ASSESSMENT AND PLAN / ED COURSE  As part of my medical decision making, I reviewed the following data within the [facility] notes reviewed and incorporated, Radiograph reviewed , Discussed with radiologist, Notes from prior ED visits and [place] Controlled Substance Database    Differential diagnosis includes, but is not limited to, fracture, dislocation, patellar injury, ligamentous strain or sprain, internal derangement of the knee.  The exam is reassuring but the patient is reporting severe pain with flexion and extension.  I discussed the images with Dr. Radene Knee with radiology to make sure that he did not see any abnormal findings including of the patella itself and he agreed  that there is no evidence of any acute fracture.  I offered the patient a knee immobilizer but he declined because he had one in the past and hated it.  Instead he excepted an Ace wrap and crutches and I gave him my usual and customary knee injury precautions and recommendations including close orthopedics follow-up.  He understands and agrees with the plan.  I checked the [place] controlled substance database and he is low risk for abuse and I prescribed him some oxycodone as described below (he reports having a rash to acetaminophen).     ____________________________________________  FINAL CLINICAL IMPRESSION(S) / ED DIAGNOSES  Final diagnoses:  Sprain of right knee, unspecified  ligament, initial encounter     MEDICATIONS GIVEN DURING THIS VISIT:  Medications  oxyCODONE (Oxy IR/ROXICODONE) immediate release tablet 10 mg (10 mg Oral Given 04/17/18 0217)  ibuprofen (ADVIL,MOTRIN) tablet 600 mg (600 mg Oral Given 04/17/18 0217)     ED Discharge Orders         Ordered    oxyCODONE (ROXICODONE) 5 MG immediate release tablet  Every 8 hours PRN     04/17/18 0224    docusate sodium (COLACE) 100 MG capsule     04/17/18 0224           Note:  This document was prepared using Dragon voice recognition software and may include unintentional dictation errors.    Hinda Kehr, MD 04/17/18 [id number]

## 2018-04-17 NOTE — ED Notes (Signed)
Patient verbalized understanding of discharge instructions, no questions. Patient out of ED via wheelchair in no distress.  

## 2018-04-17 NOTE — ED Triage Notes (Signed)
Pt arrived to the ED accompanied by family for complaints of right knee pain. Pt reports that he fell and that's when the pain started. Pt had surgery on the affected knee in the past. Pt is AOx4 in moderate pain.

## 2018-07-27 ENCOUNTER — Encounter: Payer: Self-pay | Admitting: Emergency Medicine

## 2018-07-27 ENCOUNTER — Other Ambulatory Visit: Payer: Self-pay

## 2018-07-27 ENCOUNTER — Emergency Department: Payer: Medicaid Other

## 2018-07-27 ENCOUNTER — Emergency Department
Admission: EM | Admit: 2018-07-27 | Discharge: 2018-07-27 | Disposition: A | Discharge status: Home / Self Care | Payer: Medicaid Other | Attending: Emergency Medicine | Admitting: Emergency Medicine

## 2018-07-27 DIAGNOSIS — F17210 Nicotine dependence, cigarettes, uncomplicated: Secondary | ICD-10-CM | POA: Insufficient documentation

## 2018-07-27 DIAGNOSIS — X500XXA Overexertion from strenuous movement or load, initial encounter: Secondary | ICD-10-CM | POA: Insufficient documentation

## 2018-07-27 DIAGNOSIS — Y9389 Activity, other specified: Secondary | ICD-10-CM | POA: Insufficient documentation

## 2018-07-27 DIAGNOSIS — Y92008 Other place in unspecified non-institutional (private) residence as the place of occurrence of the external cause: Secondary | ICD-10-CM | POA: Insufficient documentation

## 2018-07-27 DIAGNOSIS — S8391XA Sprain of unspecified site of right knee, initial encounter: Secondary | ICD-10-CM | POA: Insufficient documentation

## 2018-07-27 DIAGNOSIS — Y999 Unspecified external cause status: Secondary | ICD-10-CM | POA: Insufficient documentation

## 2018-07-27 MED ORDER — OXYCODONE HCL 5 MG PO TABS: 5.0000 mg | ORAL_TABLET | Freq: Three times a day (TID) | ORAL | 0 refills | Status: DC | PRN

## 2018-07-27 MED ORDER — OXYCODONE HCL 5 MG PO TABS
5.0000 mg | ORAL_TABLET | Freq: Once | ORAL | Status: AC
  Administered 2018-07-27: 5 mg via ORAL
  Filled 2018-07-27: qty 1

## 2018-07-27 NOTE — ED Notes (Signed)
No peripheral IV placed this visit.   Discharge instructions reviewed with patient. Questions fielded by this RN. Patient verbalizes understanding of instructions. Patient discharged home in stable condition per [place], [place]. No acute distress noted at time of discharge.

## 2018-07-27 NOTE — ED Triage Notes (Signed)
R knee pain x 45 minutes since fell.

## 2018-07-27 NOTE — Discharge Instructions (Signed)
Follow-up with Dr. Mack Guise who is the orthopedist on call if any continued problems with your knee.  Ice and elevation today.  Wear knee immobilizer anytime you are up walking.  You do not necessarily have to wear it while sleeping.  Take pain medication only when needed.  Do not drive or operate machinery while taking this medication.  You may also take ibuprofen with this medication if additional pain medication is needed.

## 2018-07-27 NOTE — ED Notes (Signed)
Pt fell today while walking down exterior stairs at his home and twisted his R knee. Pt has Hx of medial meniscus repair and reports pain is focused along medial joint line of R knee. Pt is unable to bear weight on affected leg. Moderate non pitting edema noted, distal pulses intact. Pt A&Ox4.

## 2018-07-27 NOTE — ED Notes (Signed)
Pt requested ginger ale. Beverage provided with PA Madalyn Rob approval.

## 2018-07-27 NOTE — ED Provider Notes (Signed)
[facility] Emergency Department Provider Note   ____________________________________________   First MD Initiated Contact with Patient 07/27/18 1856     (approximate)  I have reviewed the triage vital signs and the nursing notes.   HISTORY  Chief Complaint Knee Pain   HPI Samuel Molina is a 58 y.o. male presents to the ED with complaint of right knee pain.  Patient states he was walking down exterior steps at his home when he twisted his right knee causing him to fall.  He denies any head injury or loss of consciousness.  He states that he had a medial meniscus repair done in [place] but has had problems with that since.  He was seen in the ED last year for pain of his right knee.  Currently rates his pain as an 8 out of 10.     Past Medical History:  Diagnosis Date  . GERD (gastroesophageal reflux disease)   . Obesity     Patient Active Problem List   Diagnosis Date Noted  . Gastroesophageal reflux disease with esophagitis   . Morbid obesity ([place]) 09/19/2017    Past Surgical History:  Procedure Laterality Date  . ESOPHAGOGASTRODUODENOSCOPY (EGD) WITH PROPOFOL N/A 09/20/2017   Procedure: ESOPHAGOGASTRODUODENOSCOPY (EGD) WITH PROPOFOL;  Surgeon: Lin Landsman, MD;  Location: [facility];  Service: Gastroenterology;  Laterality: N/A;  . KNEE SURGERY Right     Prior to Admission medications   Medication Sig Start Date End Date Taking? Authorizing Provider  docusate sodium (COLACE) 100 MG capsule Take 1 tablet once or twice daily as needed for constipation while taking narcotic pain medicine 04/17/18   Hinda Kehr, MD  omeprazole (PRILOSEC OTC) 20 MG tablet Take 2 tablets (40 mg total) by mouth 2 (two) times daily before a meal. 09/19/17 01/17/18  Vanga, Tally Due, MD  omeprazole (PRILOSEC) 40 MG capsule Take 1 capsule (40 mg total) by mouth 2 (two) times daily for 14 days. 09/21/17 10/05/17  Lin Landsman, MD  ondansetron (ZOFRAN  ODT) 4 MG disintegrating tablet Allow 1-2 tablets to dissolve in your mouth every 8 hours as needed for nausea/vomiting 09/04/17   Hinda Kehr, MD  oxyCODONE (ROXICODONE) 5 MG immediate release tablet Take 1 tablet (5 mg total) by mouth every 8 (eight) hours as needed. 07/27/18 07/27/19  Johnn Hai, PA-C    Allergies Tylenol [acetaminophen]  No family history on file.  Social History Social History   Tobacco Use  . Smoking status: Current Every Day Smoker    Packs/day: 0.50    Types: Cigarettes  . Smokeless tobacco: Never Used  Substance Use Topics  . Alcohol use: Not Currently  . Drug use: Never    Review of Systems Constitutional: No fever/chills ENT: No trauma. Cardiovascular: Denies chest pain. Respiratory: Denies shortness of breath. Musculoskeletal: Positive right knee pain. Skin: Negative for rash. Neurological: Negative for headaches, focal weakness or numbness. ___________________________________________   PHYSICAL EXAM:  VITAL SIGNS: ED Triage Vitals  Enc Vitals Group     BP 07/27/18 1837 121/79     Pulse Rate 07/27/18 1837 76     Resp 07/27/18 1837 20     Temp 07/27/18 1837 98.2 F (36.8 C)     Temp Source 07/27/18 1837 Oral     SpO2 07/27/18 1837 98 %     Weight 07/27/18 1839 240 lb (108.9 kg)     Height 07/27/18 1839 6\' 1"  (1.854 m)     Head Circumference --  Peak Flow --      Pain Score 07/27/18 1839 8     Pain Loc --      Pain Edu? --      Excl. in [place]? --    Constitutional: Alert and oriented. Well appearing and in no acute distress. Eyes: Conjunctivae are normal.  Head: Atraumatic. Neck: No stridor.   Cardiovascular: Normal rate, regular rhythm. Grossly normal heart sounds.  Good peripheral circulation. Respiratory: Normal respiratory effort.  No retractions. Lungs CTAB. Musculoskeletal: Examination of the right knee there is no gross deformity however there is generalized tenderness on palpation of the anterior aspect of the patella  without effusion.  Range of motion is moderately restricted secondary to patient's pain.  Motor sensory function intact distal to the injury.  Skin is intact.  No ecchymosis or abrasions are seen. Neurologic:  Normal speech and language. No gross focal neurologic deficits are appreciated.  Skin:  Skin is warm, dry and intact. No rash noted. Psychiatric: Mood and affect are normal. Speech and behavior are normal.  ____________________________________________   LABS (all labs ordered are listed, but only abnormal results are displayed)  Labs Reviewed - No data to display  RADIOLOGY   Official radiology report(s): Dg Knee Complete 4 Views Right  Result Date: 07/27/2018 CLINICAL DATA:  Fall and right knee. EXAM: RIGHT KNEE - COMPLETE 4+ VIEW COMPARISON:  None. FINDINGS: Moderate to severe tricompartmental degenerative changes identified. No joint effusion. No fractures are seen. IMPRESSION: 1. Degenerative changes. 2. No fractures or effusions. Electronically Signed   By: Dorise Bullion III M.D   On: 07/27/2018 19:32    ____________________________________________   PROCEDURES  Procedure(s) performed (including Critical Care):  Procedures  Knee immobilizer was applied to the right knee ____________________________________________   INITIAL IMPRESSION / ASSESSMENT AND PLAN / ED COURSE  As part of my medical decision making, I reviewed the following data within the electronic MEDICAL RECORD NUMBER Notes from prior ED visits and [place] Controlled Substance Database  58 year old male presents to the ED with complaint of right knee pain.  Patient states that he has had a history of a meniscus tear in which this was repaired in [place].  He states he is continued to have problems with this knee since that time.  He was walking down the steps at his home when his knee twisted causing him to fall.  He denies any head injury or loss of consciousness.  Patient was given oxycodone 5 mg while in the ED.   X-rays did not show any acute bony injury.  Patient was placed in a knee immobilizer with instructions to ice and elevate.  He will follow-up with Dr. Mack Guise who is the orthopedist on call if any continued problems.  ____________________________________________   FINAL CLINICAL IMPRESSION(S) / ED DIAGNOSES  Final diagnoses:  Sprain of right knee, unspecified ligament, initial encounter     ED Discharge Orders         Ordered    oxyCODONE (ROXICODONE) 5 MG immediate release tablet  Every 8 hours PRN     07/27/18 1957           Note:  This document was prepared using Dragon voice recognition software and may include unintentional dictation errors.    Johnn Hai, PA-C 07/27/18 Nyra Capes, MD 07/28/18 2237

## 2018-08-05 ENCOUNTER — Emergency Department
Admission: EM | Admit: 2018-08-05 | Discharge: 2018-08-05 | Disposition: A | Discharge status: Home / Self Care | Payer: Self-pay | Attending: Emergency Medicine | Admitting: Emergency Medicine

## 2018-08-05 ENCOUNTER — Other Ambulatory Visit: Payer: Self-pay

## 2018-08-05 ENCOUNTER — Emergency Department: Payer: Self-pay

## 2018-08-05 ENCOUNTER — Encounter: Payer: Self-pay | Admitting: Intensive Care

## 2018-08-05 DIAGNOSIS — Z79899 Other long term (current) drug therapy: Secondary | ICD-10-CM | POA: Insufficient documentation

## 2018-08-05 DIAGNOSIS — F17210 Nicotine dependence, cigarettes, uncomplicated: Secondary | ICD-10-CM | POA: Insufficient documentation

## 2018-08-05 DIAGNOSIS — R066 Hiccough: Secondary | ICD-10-CM

## 2018-08-05 DIAGNOSIS — M25561 Pain in right knee: Secondary | ICD-10-CM

## 2018-08-05 MED ORDER — LIDOCAINE VISCOUS HCL 2 % MT SOLN: 10.0000 mL | OROMUCOSAL | 0 refills | Status: DC | PRN

## 2018-08-05 MED ORDER — OXYCODONE HCL 10 MG PO TABS: 10.0000 mg | ORAL_TABLET | Freq: Three times a day (TID) | ORAL | 0 refills | Status: AC | PRN

## 2018-08-05 MED ORDER — ALUM & MAG HYDROXIDE-SIMETH 200-200-20 MG/5ML PO SUSP
30.0000 mL | Freq: Once | ORAL | Status: AC
  Administered 2018-08-05: 30 mL via ORAL
  Filled 2018-08-05: qty 30

## 2018-08-05 MED ORDER — LIDOCAINE VISCOUS HCL 2 % MT SOLN
15.0000 mL | Freq: Once | OROMUCOSAL | Status: AC
  Administered 2018-08-05: 16:00:00 15 mL via ORAL
  Filled 2018-08-05: qty 15

## 2018-08-05 MED ORDER — LIDOCAINE VISCOUS HCL 2 % MT SOLN: 10.0000 mL | OROMUCOSAL | 1 refills | Status: DC | PRN

## 2018-08-05 MED ORDER — OXYCODONE HCL 5 MG PO TABS
5.0000 mg | ORAL_TABLET | Freq: Once | ORAL | Status: AC
  Administered 2018-08-05: 5 mg via ORAL
  Filled 2018-08-05: qty 1

## 2018-08-05 NOTE — Discharge Instructions (Signed)
Please wear knee brace until follow-up with Dr. Mack Guise.  You can take Percocet for extreme pain.  Please call Dr. Tami Ribas for an appointment as soon as possible.  Please return the emergency department immediately if any symptoms change or worsen.

## 2018-08-05 NOTE — ED Notes (Signed)
See triage note   Presents with right knee pain  States his knee gave out and fell in shower  No deformity noted

## 2018-08-05 NOTE — ED Provider Notes (Signed)
[facility] Emergency Department Provider Note  ____________________________________________  Time seen: Approximately 5:25 PM  I have reviewed the triage vital signs and the nursing notes.   HISTORY  Chief Complaint Knee Pain (right) and Chest Pain    HPI Samuel Molina is a 58 y.o. male presents emergency department for evaluation of right knee pain after his knee gave out on him in the shower and he landed on right knee.  Patient states that he had meniscal surgery in [place] previously but has had problems with this knee ever since surgery.  He was seen in this emergency department for pain to this knee 10 days ago.  He has a follow-up appointment with Dr. Mack Guise on Tuesday.  Patient is also hiccuping and spitting up sputum while in the emergency department, which he says has been chronic for the last year.  He states that today is a mild day and much better than previously.  Symptoms worsened 1 week ago and [facility] and had [facility] full work-up with blood work and a CT scan.  He has also seen GI and ENT and has had endoscopies completed.  No one can find a cause for his symptoms.  He has tried several over-the-counter home remedies including apple cider vinegar and baking soda solutions without relief.  He has been prescribed several medications from several different specialists and primary care providers without any relief.  Patient states that he hiccups so much during the day that he vomits.  He describes a burning sensation in his esophagus.  He does not wish to be work-up further for this but would like referral to an ENT here because he has a follow-up appointment in [place], which is a bit far for him to drive.   Past Medical History:  Diagnosis Date  . GERD (gastroesophageal reflux disease)   . Obesity     Patient Active Problem List   Diagnosis Date Noted  . Gastroesophageal reflux disease with esophagitis   . Morbid obesity ([place])  09/19/2017    Past Surgical History:  Procedure Laterality Date  . ESOPHAGOGASTRODUODENOSCOPY (EGD) WITH PROPOFOL N/A 09/20/2017   Procedure: ESOPHAGOGASTRODUODENOSCOPY (EGD) WITH PROPOFOL;  Surgeon: Lin Landsman, MD;  Location: [facility];  Service: Gastroenterology;  Laterality: N/A;  . KNEE SURGERY Right     Prior to Admission medications   Medication Sig Start Date End Date Taking? Authorizing Provider  docusate sodium (COLACE) 100 MG capsule Take 1 tablet once or twice daily as needed for constipation while taking narcotic pain medicine 04/17/18   Hinda Kehr, MD  lidocaine (XYLOCAINE) 2 % solution Use as directed 10 mLs in the mouth or throat as needed. 08/05/18   Laban Emperor, PA-C  omeprazole (PRILOSEC OTC) 20 MG tablet Take 2 tablets (40 mg total) by mouth 2 (two) times daily before a meal. 09/19/17 01/17/18  Vanga, Tally Due, MD  omeprazole (PRILOSEC) 40 MG capsule Take 1 capsule (40 mg total) by mouth 2 (two) times daily for 14 days. 09/21/17 10/05/17  Lin Landsman, MD  ondansetron (ZOFRAN ODT) 4 MG disintegrating tablet Allow 1-2 tablets to dissolve in your mouth every 8 hours as needed for nausea/vomiting 09/04/17   Hinda Kehr, MD  Oxycodone HCl 10 MG TABS Take 1 tablet (10 mg total) by mouth every 8 (eight) hours as needed for up to 3 days. 08/05/18 08/08/18  Laban Emperor, PA-C    Allergies Tylenol [acetaminophen]  History reviewed. No pertinent family history.  Social  History Social History   Tobacco Use  . Smoking status: Current Every Day Smoker    Packs/day: 0.50    Types: Cigarettes  . Smokeless tobacco: Never Used  Substance Use Topics  . Alcohol use: Not Currently  . Drug use: Never     Review of Systems  Cardiovascular: No chest pain. Respiratory:  No SOB. Gastrointestinal: No abdominal pain.  No nausea, no vomiting.  Musculoskeletal: Positive for knee pain. Skin: Negative for rash, abrasions, lacerations,  ecchymosis. Neurological: Negative for headaches   ____________________________________________   PHYSICAL EXAM:  VITAL SIGNS: ED Triage Vitals [08/05/18 1438]  Enc Vitals Group     BP 134/87     Pulse Rate 74     Resp 16     Temp 98.2 F (36.8 C)     Temp Source Oral     SpO2 98 %     Weight 243 lb (110.2 kg)     Height 6\' 1"  (1.854 m)     Head Circumference      Peak Flow      Pain Score 2     Pain Loc      Pain Edu?      Excl. in [place]?      Constitutional: Alert and oriented.  Fairly constant hiccups. Eyes: Conjunctivae are normal. PERRL. EOMI. Head: Atraumatic. ENT:      Ears:      Nose: No congestion/rhinnorhea.      Mouth/Throat: Mucous membranes are moist.  Neck: No stridor.  Cardiovascular: Normal rate, regular rhythm.  Good peripheral circulation. Respiratory: Normal respiratory effort without tachypnea or retractions. Lungs CTAB. Good air entry to the bases with no decreased or absent breath sounds. Gastrointestinal: Bowel sounds 4 quadrants. Soft and nontender to palpation. No guarding or rigidity. No palpable masses. No distention.  Full range of motion of right knee.  Weightbearing.  No visible swelling or erythema. Musculoskeletal: Full range of motion to all extremities. No gross deformities appreciated. Neurologic:  Normal speech and language. No gross focal neurologic deficits are appreciated.  Skin:  Skin is warm, dry and intact. No rash noted. Psychiatric: Mood and affect are normal. Speech and behavior are normal. Patient exhibits appropriate insight and judgement.   ____________________________________________   LABS (all labs ordered are listed, but only abnormal results are displayed)  Labs Reviewed - No data to display ____________________________________________  EKG  SR ____________________________________________  RADIOLOGY Robinette Haines, personally viewed and evaluated these images (plain radiographs) as part of my medical  decision making, as well as reviewing the written report by the radiologist.  Dg Knee Complete 4 Views Right  Result Date: 08/05/2018 CLINICAL DATA:  Fall in shower with right knee pain. EXAM: RIGHT KNEE - COMPLETE 4+ VIEW COMPARISON:  07/27/2018 and 04/17/2018 FINDINGS: Exam demonstrates mild tricompartmental osteoarthritic change worse over the patellofemoral joint. No acute fracture or dislocation. No evidence of joint effusion. IMPRESSION: No acute findings. Osteoarthritic change. Electronically Signed   By: Marin Olp M.D.   On: 08/05/2018 15:54    ____________________________________________    PROCEDURES  Procedure(s) performed:    Procedures    Medications  alum & mag hydroxide-simeth (MAALOX/MYLANTA) 200-200-20 MG/5ML suspension 30 mL (30 mLs Oral Given 08/05/18 1606)    And  lidocaine (XYLOCAINE) 2 % viscous mouth solution 15 mL (15 mLs Oral Given 08/05/18 1608)  oxyCODONE (Oxy IR/ROXICODONE) immediate release tablet 5 mg (5 mg Oral Given 08/05/18 1607)     ____________________________________________   INITIAL IMPRESSION /  ASSESSMENT AND PLAN / ED COURSE  Pertinent labs & imaging results that were available during my care of the patient were reviewed by me and considered in my medical decision making (see chart for details).  Review of the [place] CSRS was performed in accordance of the [place] prior to dispensing any controlled drugs.     Patient presented to the emergency department for evaluation of right knee pain after fall today.  X-ray consistent with osteoarthritis.  Patient has a knee brace at home and crutches that he will use.  He has a follow-up with Dr. Geanie Cooley next week.  Patient is hiccuping frequently while in the emergency department.  He had a full [facility]up [facility] week ago.  He has been seen by GI and ENT without any diagnosis.  He does not wish to have any further work-up for this in the emergency department.  He was given a GI cocktail in the  emergency department.  He states that the viscous lidocaine has given him the most relief out of any of the over-the-counter medications or prescribed medications that he has tried over the course of the last year.  He states that after medication this has been the first time that he has been able to speak a full paragraph without hiccuping.  Patient will be discharged home with prescriptions for viscous lidocaine and a short course of oxycodone for knee pain. Patient is to follow up with orthopedics and ENT as directed. Patient is given ED precautions to return to the ED for any worsening or new symptoms.     ____________________________________________  FINAL CLINICAL IMPRESSION(S) / ED DIAGNOSES  Final diagnoses:  Acute pain of right knee  Hiccups      NEW MEDICATIONS STARTED DURING THIS VISIT:  ED Discharge Orders         Ordered    lidocaine (XYLOCAINE) 2 % solution  As needed,   Status:  Discontinued     08/05/18 1658    Oxycodone HCl 10 MG TABS  Every 8 hours PRN     08/05/18 1658    lidocaine (XYLOCAINE) 2 % solution  As needed     08/05/18 1724              This chart was dictated using voice recognition software/Dragon. Despite best efforts to proofread, errors can occur which can change the meaning. Any change was purely unintentional.    Laban Emperor, PA-C 08/05/18 [place], [place], MD 08/07/18 [phone]

## 2018-08-05 NOTE — ED Triage Notes (Addendum)
Patient c/o his right knee giving out in shower and falling. Now having knee pain and HX torn meniscus. Patient also c/o acid reflux and is burping constantly. C/o burning in his chest. Acid reflux is ongoing issues being addresed by PCP. Patients knee is what brought him to ER.

## 2018-11-14 ENCOUNTER — Other Ambulatory Visit: Payer: Self-pay

## 2018-11-14 ENCOUNTER — Emergency Department
Admission: EM | Admit: 2018-11-14 | Discharge: 2018-11-14 | Disposition: A | Discharge status: Home / Self Care | Payer: Medicaid Other | Attending: Emergency Medicine | Admitting: Emergency Medicine

## 2018-11-14 ENCOUNTER — Encounter: Payer: Self-pay | Admitting: Emergency Medicine

## 2018-11-14 DIAGNOSIS — Z5321 Procedure and treatment not carried out due to patient leaving prior to being seen by health care provider: Secondary | ICD-10-CM | POA: Insufficient documentation

## 2018-11-14 DIAGNOSIS — R1110 Vomiting, unspecified: Secondary | ICD-10-CM | POA: Insufficient documentation

## 2018-11-14 DIAGNOSIS — M25561 Pain in right knee: Secondary | ICD-10-CM | POA: Insufficient documentation

## 2018-11-14 LAB — COMPREHENSIVE METABOLIC PANEL
ALT: 15 U/L (ref 0–44)
AST: 19 U/L (ref 15–41)
Albumin: 3.9 g/dL (ref 3.5–5.0)
Alkaline Phosphatase: 90 U/L (ref 38–126)
Anion gap: 9 (ref 5–15)
BUN: 12 mg/dL (ref 6–20)
CO2: 24 mmol/L (ref 22–32)
Calcium: 9.3 mg/dL (ref 8.9–10.3)
Chloride: 106 mmol/L (ref 98–111)
Creatinine, Ser: 0.96 mg/dL (ref 0.61–1.24)
GFR calc Af Amer: >60 mL/min (ref >60)
GFR calc non Af Amer: >60 mL/min (ref >60)
Glucose, Bld: 101 mg/dL — ABNORMAL HIGH (ref 70–99)
Potassium: 3.9 mmol/L (ref 3.5–5.1)
Sodium: 139 mmol/L (ref 135–145)
Total Bilirubin: 0.5 mg/dL (ref 0.3–1.2)
Total Protein: 7.4 g/dL (ref 6.5–8.1)

## 2018-11-14 LAB — CBC
HCT: 46.8 % (ref 39.0–52.0)
Hemoglobin: 15.1 g/dL (ref 13.0–17.0)
MCH: 26.4 pg (ref 26.0–34.0)
MCHC: 32.3 g/dL (ref 30.0–36.0)
MCV: 81.8 fL (ref 80.0–100.0)
Platelets: 324 10*3/uL (ref 150–400)
RBC: 5.72 MIL/uL (ref 4.22–5.81)
RDW: 14 % (ref 11.5–15.5)
WBC: 10.2 10*3/uL (ref 4.0–10.5)
nRBC: 0 % (ref 0.0–0.2)

## 2018-11-14 LAB — LIPASE, BLOOD: Lipase: 28 U/L (ref 11–51)

## 2018-11-14 MED ORDER — ONDANSETRON HCL 4 MG/2ML IJ SOLN
4.0000 mg | Freq: Once | INTRAMUSCULAR | Status: AC | PRN
  Administered 2018-11-14: 4 mg via INTRAVENOUS
  Filled 2018-11-14: qty 2

## 2018-11-14 NOTE — ED Triage Notes (Signed)
Pt in via POV, reports mechanical fall today, landing on right knee, reports torn meniscus to same knee previously.  Pt also reports severe acid reflux x one month, unable to keep anything down, 50lb weight loss since January.  Pt reports unremarkable endoscopy w/ referral to throat specialist who he has been unable to see yet due to COVID restrictions.  Pt belching and dry heaving in triage.  Vitals WDL.

## 2018-11-18 ENCOUNTER — Telehealth: Payer: Self-pay | Admitting: Emergency Medicine

## 2018-11-18 NOTE — Telephone Encounter (Signed)
Called patient due to lwot to inquire about condition and follow up plans.patient has not contacted pcp, but he agrees to do that and is aware that lab results are available.

## 2019-01-29 ENCOUNTER — Encounter: Payer: Self-pay | Admitting: Emergency Medicine

## 2019-01-29 ENCOUNTER — Emergency Department
Admission: EM | Admit: 2019-01-29 | Discharge: 2019-01-29 | Disposition: A | Discharge status: Home / Self Care | Payer: Medicaid Other | Attending: Emergency Medicine | Admitting: Emergency Medicine

## 2019-01-29 ENCOUNTER — Other Ambulatory Visit: Payer: Self-pay

## 2019-01-29 DIAGNOSIS — Y999 Unspecified external cause status: Secondary | ICD-10-CM | POA: Insufficient documentation

## 2019-01-29 DIAGNOSIS — F17210 Nicotine dependence, cigarettes, uncomplicated: Secondary | ICD-10-CM | POA: Insufficient documentation

## 2019-01-29 DIAGNOSIS — Y9389 Activity, other specified: Secondary | ICD-10-CM | POA: Insufficient documentation

## 2019-01-29 DIAGNOSIS — T63441A Toxic effect of venom of bees, accidental (unintentional), initial encounter: Secondary | ICD-10-CM

## 2019-01-29 DIAGNOSIS — Y92481 Parking lot as the place of occurrence of the external cause: Secondary | ICD-10-CM | POA: Insufficient documentation

## 2019-01-29 DIAGNOSIS — W57XXXA Bitten or stung by nonvenomous insect and other nonvenomous arthropods, initial encounter: Secondary | ICD-10-CM | POA: Insufficient documentation

## 2019-01-29 DIAGNOSIS — S00461A Insect bite (nonvenomous) of right ear, initial encounter: Secondary | ICD-10-CM | POA: Insufficient documentation

## 2019-01-29 DIAGNOSIS — S8391XA Sprain of unspecified site of right knee, initial encounter: Secondary | ICD-10-CM | POA: Insufficient documentation

## 2019-01-29 MED ORDER — NEOMYCIN-POLYMYXIN-HC 3.5-10000-1 OT SOLN: 3.0000 [drp] | Freq: Three times a day (TID) | OTIC | 0 refills | Status: AC

## 2019-01-29 MED ORDER — NABUMETONE 750 MG PO TABS: 750.0000 mg | ORAL_TABLET | Freq: Two times a day (BID) | ORAL | 0 refills | Status: AC

## 2019-01-29 NOTE — ED Notes (Signed)
Pt here via EMS with c/o walking down the road saw a bee, swatted at it, then felt it go into his right ear. VSS per EMS, pt in no distress at this time.

## 2019-01-29 NOTE — ED Triage Notes (Signed)
Patient reports having a bee fly into his right ear 30 min before arrival. Patient states that he is randomly getting sharp pains in ear and believes bee is still moving around. Nothing visualized in ear in triage.

## 2019-01-29 NOTE — Discharge Instructions (Addendum)
Use the ear drops as directed. Take OTC Benadryl for additional itch and pain relief related to the bee sting. Follow-up with [facility] as needed. Call Dr. Donney Rankins regarding your chronic knee pain.

## 2019-01-29 NOTE — ED Provider Notes (Signed)
[facility] Emergency Department Provider Note ____________________________________________  Time seen: 1536  I have reviewed the triage vital signs and the nursing notes.  HISTORY  Chief Complaint  Foreign Body in Ear  HPI Samuel Molina is a 58 y.o. male presents to the ED via EMS, from a local parking lot.  Patient apparently encountered to be that the listing him on the face, but actually fluid to his right ear.  He presents with pain to the right ear as well as some bleeding from the ear.  He was flailing around in a parking lot when someone called EMS on his behalf.  He presents here for evaluation of right ear pain.  He admits to initially hearing the buzzing from the be in the ear, but does note that that buzzing has ceased.  He is unclear as to whether that B is still in his ear canal.  He denies any nausea, vomiting, or dizziness.  Also denies any difficulty breathing, swallowing, or controlling secretions.  Patient presents now for further evaluation.  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   He denies any other injury at this time.  Past Medical History:  Diagnosis Date  . GERD (gastroesophageal reflux disease)   . Obesity     Patient Active Problem List   Diagnosis Date Noted  . Gastroesophageal reflux disease with esophagitis   . Morbid obesity ([place]) 09/19/2017    Past Surgical History:  Procedure Laterality Date  . ESOPHAGOGASTRODUODENOSCOPY (EGD) WITH PROPOFOL N/A 09/20/2017   Procedure: ESOPHAGOGASTRODUODENOSCOPY (EGD) WITH PROPOFOL;  Surgeon: Lin Landsman, MD;  Location: [facility];  Service: Gastroenterology;  Laterality: N/A;  . KNEE SURGERY Right     Prior to Admission medications   Medication Sig Start Date End Date Taking? Authorizing Provider  nabumetone (RELAFEN) 750 MG tablet Take 1 tablet (750 mg total) by mouth 2 (two) times daily for 10 days. 01/29/19 02/08/19  Shawnna Pancake, Dannielle Karvonen, PA-C  neomycin-polymyxin-hydrocortisone (CORTISPORIN) OTIC solution Place 3 drops into the right ear 3 (three) times daily for 10 days. 01/29/19 02/08/19  Cristin Penaflor, Dannielle Karvonen, PA-C  omeprazole (PRILOSEC) 40 MG capsule Take 1 capsule (40 mg total) by mouth 2 (two) times daily for 14 days. 09/21/17 10/05/17  Lin Landsman, MD  ondansetron (ZOFRAN ODT) 4 MG disintegrating tablet Allow 1-2 tablets to dissolve in your mouth every 8 hours as needed for nausea/vomiting 09/04/17   Hinda Kehr, MD  omeprazole (PRILOSEC OTC) 20 MG tablet Take 2 tablets (40 mg total) by mouth 2 (two) times daily before a meal. 09/19/17 01/29/19  Lin Landsman, MD    Allergies Tylenol [acetaminophen]  No family history on file.  Social History Social History   Tobacco Use  . Smoking status: Current Every Day Smoker    Packs/day: 0.50    Types: Cigarettes  . Smokeless tobacco: Never Used  Substance Use Topics  . Alcohol use: Not Currently  . Drug use: Never    Review of Systems  Constitutional: Negative for fever. Eyes: Negative for visual changes. ENT: Negative for sore throat.  Right ear pain as above. Cardiovascular: Negative for chest pain. Respiratory: Negative for shortness of breath. Gastrointestinal: Negative for abdominal pain, vomiting and diarrhea. Genitourinary: Negative for dysuria. Musculoskeletal: Negative for back pain.  Right knee pain as above. Skin: Negative for rash. Neurological: Negative for headaches, focal weakness or numbness. ____________________________________________  PHYSICAL EXAM:  VITAL SIGNS: ED Triage Vitals  Enc Vitals Group     BP 01/29/19 1456 122/68     Pulse Rate 01/29/19 1456 68     Resp 01/29/19 1456 16     Temp 01/29/19 1456 98.7 F (37.1 C)     Temp Source 01/29/19 1456 Oral     SpO2 01/29/19 1456 99 %     Weight 01/29/19 1457 235 lb (106.6 kg)     Height 01/29/19 1457 6\' 1"  (1.854 m)     Head Circumference --      Peak Flow --       Pain Score 01/29/19 1457 7     Pain Loc --      Pain Edu? --      Excl. in [place]? --     Constitutional: Alert and oriented. Well appearing and in no distress. Head: Normocephalic and atraumatic. Eyes: Conjunctivae are normal. PERRL. Normal extraocular movements Ears: Canals clear.  Right ear canals without any obvious foreign body including any bees.  Patient does have some erythema to the superior portion of the ear canal extending towards the TM.  The TM is intact.  In the anterior portion of the distal canal does show local trauma consistent with a likely bee sting.  There is some scant amount of blood in the canal but no hemotympanum, eustachian tube edema, or other swelling.  TMs intact bilaterally. Nose: No congestion/rhinorrhea/epistaxis. Mouth/Throat: Mucous membranes are moist. Neck: Supple. No thyromegaly. Hematological/Lymphatic/Immunological: No cervical lymphadenopathy. Cardiovascular: Normal rate, regular rhythm. Normal distal pulses. Respiratory: Normal respiratory effort. No wheezes/rales/rhonchi. Gastrointestinal: Soft and nontender. No distention. Musculoskeletal: Knee without obvious deformity or dislocation.  Patient evidence for normal flexion extension range.  He is mildly tender to palpation to the medial joint space.  Nontender with normal range of motion in all extremities.  Neurologic:  Normal gait without ataxia. Normal speech and language. No gross focal neurologic deficits are appreciated. Skin:  Skin is warm, dry and intact. No rash noted. ____________________________________________  PROCEDURES  Tetracaine iii gtts right ear Procedures ____________________________________________  INITIAL IMPRESSION / ASSESSMENT AND PLAN / ED COURSE  Patient with ED evaluation of an accidental bee sting to the right ear canal.  Patient clinical picture is consistent with a bee sting but no retained foreign body.  He reports improvement of his symptoms after topical pain  medicine is applied.  He is encouraged to continue to take over-the-counter Motrin for pain relief, and Benadryl for any additional itch relief.  A prescription for hydrocortisone otic solution is provided for his benefit.  A prescription for Relafen is also provided for his knee pain.  He will follow-up with his orthopedic provider or return to the ED as needed.  Shantel Wiedmaier was evaluated in Emergency Department on 01/29/2019 for the symptoms described in the history of present illness. He was evaluated in the context of the global COVID-19 pandemic, which necessitated consideration that the patient might be at risk for infection with the SARS-CoV-2 virus that causes COVID-19. Institutional protocols and algorithms that pertain to the evaluation of patients at risk for COVID-19 are in a state of rapid change based on information released by regulatory bodies including the CDC and federal and state organizations. These policies and algorithms were followed during the patient's care in the ED. ____________________________________________  FINAL CLINICAL IMPRESSION(S) / ED DIAGNOSES  Final diagnoses:  Bee sting, accidental or unintentional, initial encounter  Sprain of right knee, unspecified ligament, initial encounter      Jaquil Todt, Dannielle Karvonen, PA-C  01/29/19 1700    Duffy Bruce, MD 01/31/19 1309

## 2019-05-01 ENCOUNTER — Ambulatory Visit: Payer: Medicaid Other | Attending: Internal Medicine

## 2019-05-01 DIAGNOSIS — Z20822 Contact with and (suspected) exposure to covid-19: Secondary | ICD-10-CM

## 2019-05-03 LAB — NOVEL CORONAVIRUS, NAA: SARS-CoV-2, NAA: NOT DETECTED

## 2019-05-05 ENCOUNTER — Telehealth: Payer: Self-pay | Admitting: General Practice

## 2019-05-05 NOTE — Telephone Encounter (Signed)
Pt aware covid lab test negative, not detected °

## 2019-10-20 ENCOUNTER — Other Ambulatory Visit: Payer: Self-pay

## 2019-10-20 ENCOUNTER — Emergency Department: Payer: Self-pay

## 2019-10-20 ENCOUNTER — Encounter: Payer: Self-pay | Admitting: Emergency Medicine

## 2019-10-20 ENCOUNTER — Emergency Department
Admission: EM | Admit: 2019-10-20 | Discharge: 2019-10-20 | Disposition: A | Discharge status: Home / Self Care | Payer: Self-pay | Attending: Emergency Medicine | Admitting: Emergency Medicine

## 2019-10-20 DIAGNOSIS — M1711 Unilateral primary osteoarthritis, right knee: Secondary | ICD-10-CM

## 2019-10-20 DIAGNOSIS — F17210 Nicotine dependence, cigarettes, uncomplicated: Secondary | ICD-10-CM | POA: Insufficient documentation

## 2019-10-20 DIAGNOSIS — M25461 Effusion, right knee: Secondary | ICD-10-CM | POA: Insufficient documentation

## 2019-10-20 MED ORDER — OXYCODONE HCL ER 10 MG PO T12A: 10.0000 mg | EXTENDED_RELEASE_TABLET | Freq: Two times a day (BID) | ORAL | 0 refills | Status: DC

## 2019-10-20 MED ORDER — TRAMADOL HCL 50 MG PO TABS
50.0000 mg | ORAL_TABLET | Freq: Once | ORAL | Status: AC
  Administered 2019-10-20: 50 mg via ORAL
  Filled 2019-10-20: qty 1

## 2019-10-20 MED ORDER — IBUPROFEN 600 MG PO TABS
600.0000 mg | ORAL_TABLET | Freq: Once | ORAL | Status: AC
  Administered 2019-10-20: 600 mg via ORAL
  Filled 2019-10-20: qty 1

## 2019-10-20 MED ORDER — IBUPROFEN 800 MG PO TABS
800.0000 mg | ORAL_TABLET | Freq: Three times a day (TID) | ORAL | 0 refills | Status: DC | PRN
Start: 2019-10-20 — End: 2019-12-03

## 2019-10-20 NOTE — ED Triage Notes (Signed)
Patient presents to the ED with right knee pain.  Patient states he was re-arranging furniture and the couch fell on patient's knee.  Patient appears uncomfortable in triage.  Patient states he was, "barely" able to bear weight on right leg.

## 2019-10-20 NOTE — ED Provider Notes (Signed)
[facility] Emergency Department Provider Note   ____________________________________________   First MD Initiated Contact with Patient 10/20/19 1346     (approximate)  I have reviewed the triage vital signs and the nursing notes.   HISTORY  Chief Complaint Knee Pain    HPI Samuel [place] is a 59 y.o. male patient complain of right knee pain which occurred today.  Patient said he was rearranging furniture the callus fell on his knee.  Patient stated barely able to bear weight on the right leg.  Patient states last week he had x-rays done at [facility] showing a fracture of the patella.  Patient rates his pain as a 10/10.  Patient described pain is "achy".  Patient said mild relief applying ice to the area.         Past Medical History:  Diagnosis Date   GERD (gastroesophageal reflux disease)    Obesity     Patient Active Problem List   Diagnosis Date Noted   Gastroesophageal reflux disease with esophagitis    Morbid obesity ([place]) 09/19/2017    Past Surgical History:  Procedure Laterality Date   ESOPHAGOGASTRODUODENOSCOPY (EGD) WITH PROPOFOL N/A 09/20/2017   Procedure: ESOPHAGOGASTRODUODENOSCOPY (EGD) WITH PROPOFOL;  Surgeon: Lin Landsman, MD;  Location: [place];  Service: Gastroenterology;  Laterality: N/A;   KNEE SURGERY Right     Prior to Admission medications   Medication Sig Start Date End Date Taking? Authorizing Provider  ibuprofen (ADVIL) 800 MG tablet Take 1 tablet (800 mg total) by mouth every 8 (eight) hours as needed for moderate pain. 10/20/19   Sable Feil, PA-C  omeprazole (PRILOSEC) 40 MG capsule Take 1 capsule (40 mg total) by mouth 2 (two) times daily for 14 days. 09/21/17 10/05/17  Lin Landsman, MD  ondansetron (ZOFRAN ODT) 4 MG disintegrating tablet Allow 1-2 tablets to dissolve in your mouth every 8 hours as needed for nausea/vomiting 09/04/17   Hinda Kehr, MD  oxyCODONE (OXYCONTIN) 10 mg  12 hr tablet Take 1 tablet (10 mg total) by mouth every 12 (twelve) hours. 10/20/19   Sable Feil, PA-C  omeprazole (PRILOSEC OTC) 20 MG tablet Take 2 tablets (40 mg total) by mouth 2 (two) times daily before a meal. 09/19/17 01/29/19  Lin Landsman, MD    Allergies Acetaminophen  No family history on file.  Social History Social History   Tobacco Use   Smoking status: Current Every Day Smoker    Packs/day: 0.50    Types: Cigarettes   Smokeless tobacco: Never Used  Vaping Use   Vaping Use: Never used  Substance Use Topics   Alcohol use: Not Currently   Drug use: Never    Review of Systems Constitutional: No fever/chills Eyes: No visual changes. ENT: No sore throat. Cardiovascular: Denies chest pain. Respiratory: Denies shortness of breath. Gastrointestinal: No abdominal pain.  No nausea, no vomiting.  No diarrhea.  No constipation. Genitourinary: Negative for dysuria. Musculoskeletal: Right knee pain. Skin: Negative for rash. Neurological: Negative for headaches, focal weakness or numbness. Allergic/Immunilogical: Tylenol ____________________________________________   PHYSICAL EXAM:  VITAL SIGNS: ED Triage Vitals  Enc Vitals Group     BP 10/20/19 1242 116/62     Pulse Rate 10/20/19 1242 69     Resp 10/20/19 1242 16     Temp 10/20/19 1242 98.2 F (36.8 C)     Temp Source 10/20/19 1242 Oral     SpO2 10/20/19 1242 98 %     Weight 10/20/19  1243 210 lb (95.3 kg)     Height 10/20/19 1243 6\' 1"  (1.854 m)     Head Circumference --      Peak Flow --      Pain Score 10/20/19 1243 10     Pain Loc --      Pain Edu? --      Excl. in [place]? --    Constitutional: Alert and oriented. Well appearing and in no acute distress. Cardiovascular: Normal rate, regular rhythm. Grossly normal heart sounds.  Good peripheral circulation. Respiratory: Normal respiratory effort.  No retractions. Lungs CTAB. Musculoskeletal: No obvious deformity to the right knee.  Patient  has moderate guarding palpation anterior patella.  Patient has increased guarding with extension of the knee.  Neurologic:  Normal speech and language. No gross focal neurologic deficits are appreciated. No gait instability. Skin:  Skin is warm, dry and intact. No rash noted. Psychiatric: Mood and affect are normal. Speech and behavior are normal.  ____________________________________________   LABS (all labs ordered are listed, but only abnormal results are displayed)  Labs Reviewed - No data to display ____________________________________________  EKG   ____________________________________________  RADIOLOGY  ED MD interpretation:    Official radiology report(s): CT Knee Right Wo Contrast  Result Date: 10/20/2019 CLINICAL DATA:  Right knee pain since a crush injury. EXAM: CT OF THE RIGHT KNEE WITHOUT CONTRAST TECHNIQUE: Multidetector CT imaging of the right knee was performed according to the standard protocol. Multiplanar CT image reconstructions were also generated. COMPARISON:  Radiographs dated 10/20/2019 and 08/05/2018 FINDINGS: Bones/Joint/Cartilage There is no fracture or dislocation. Small joint effusion. Tricompartmental moderate osteoarthritis. No Baker's cyst. Ligaments Suboptimally assessed by CT. The collateral ligaments in the posterior cruciate ligament are well seen and are intact. The anterior cruciate ligament is not well enough seen for assessment. Muscles and Tendons No appreciable abnormalities. Soft tissues Normal. IMPRESSION: 1. No acute osseous abnormality of the right knee. 2. Tricompartmental moderate osteoarthritis. 3. Small joint effusion. 4. The anterior cruciate ligament is not well enough seen for assessment. Electronically Signed   By: Lorriane Shire M.D.   On: 10/20/2019 14:26   DG Knee Complete 4 Views Right  Result Date: 10/20/2019 CLINICAL DATA:  Fall.  Knee pain EXAM: RIGHT KNEE - COMPLETE 4+ VIEW COMPARISON:  08/05/2018 FINDINGS: Negative for  fracture or joint effusion.  Normal alignment. Joint space narrowing and spurring in all 3 compartments. Relatively mild joint space narrowing. IMPRESSION: No acute abnormality. Moderate chronic degenerative change in all 3 compartments of the knee. Electronically Signed   By: Franchot Gallo M.D.   On: 10/20/2019 13:11    ____________________________________________   PROCEDURES  Procedure(s) performed (including Critical Care):  Procedures   ____________________________________________   INITIAL IMPRESSION / ASSESSMENT AND PLAN / ED COURSE  As part of my medical decision making, I reviewed the following data within the [place]     Patient presents with right knee pain secondary to dropping furniture on knee prior to arrival.  Patient states he was told he had a small patella fracture on x-ray taken last week at [facility].  Discussed x-ray findings with patient showing only tricompartmental arthritis and a small knee effusion.  CT confirmed today's x-ray.  Patient placed in a knee immobilizer and advised to ambulate with his crutches when you get home.  Take medication as directed.  Advised to follow-up with orthopedic for definitive evaluation and treatment.   Burnie Therien was evaluated in Emergency Department on 10/20/2019  for the symptoms described in the history of present illness. He was evaluated in the context of the global COVID-19 pandemic, which necessitated consideration that the patient might be at risk for infection with the SARS-CoV-2 virus that causes COVID-19. Institutional protocols and algorithms that pertain to the evaluation of patients at risk for COVID-19 are in a state of rapid change based on information released by regulatory bodies including the CDC and federal and state organizations. These policies and algorithms were followed during the patient's care in the ED.       ____________  FINAL CLINICAL IMPRESSION(S) / ED  DIAGNOSES  Final diagnoses:  Effusion of right knee  Arthritis of right knee     ED Discharge Orders         Ordered    oxyCODONE (OXYCONTIN) 10 mg 12 hr tablet  Every 12 hours     Discontinue  Reprint     10/20/19 1501    ibuprofen (ADVIL) 800 MG tablet  Every 8 hours PRN     Discontinue  Reprint     10/20/19 1501           Note:  This document was prepared using Dragon voice recognition software and may include unintentional dictation errors.    Sable Feil, PA-C 10/20/19 1507    Carrie Mew, MD 10/20/19 [phone]

## 2019-10-20 NOTE — ED Notes (Signed)
[facility] called and stated they will only be able to fill a 5 day supply of medication.

## 2019-10-20 NOTE — Discharge Instructions (Signed)
Follow discharge care instructions and ambulate with support of knee immobilizer and crutches.  Follow-up with orthopedic if no improvement in 3 to 5 days.  Be advised medication may cause drowsiness.

## 2019-10-20 NOTE — ED Notes (Signed)
See triage note; pt dropped sofa on his right knee and is in severe pain. Pt appears very uncomfortable, keeps right leg lifted when transferring from wheelchair to stretcher. Pt alert & oriented.

## 2019-10-20 NOTE — ED Notes (Signed)
Dr stafford at bedside 

## 2019-12-03 ENCOUNTER — Other Ambulatory Visit: Payer: Self-pay

## 2019-12-03 ENCOUNTER — Emergency Department
Admission: EM | Admit: 2019-12-03 | Discharge: 2019-12-05 | Disposition: A | Discharge status: Home / Self Care | Payer: Medicaid Other | Attending: Emergency Medicine | Admitting: Emergency Medicine

## 2019-12-03 DIAGNOSIS — Z20822 Contact with and (suspected) exposure to covid-19: Secondary | ICD-10-CM | POA: Insufficient documentation

## 2019-12-03 DIAGNOSIS — F17210 Nicotine dependence, cigarettes, uncomplicated: Secondary | ICD-10-CM | POA: Insufficient documentation

## 2019-12-03 DIAGNOSIS — R45850 Homicidal ideations: Secondary | ICD-10-CM

## 2019-12-03 LAB — CBC
HCT: 43.4 % (ref 39.0–52.0)
Hemoglobin: 13.8 g/dL (ref 13.0–17.0)
MCH: 26.7 pg (ref 26.0–34.0)
MCHC: 31.8 g/dL (ref 30.0–36.0)
MCV: 84.1 fL (ref 80.0–100.0)
Platelets: 288 10*3/uL (ref 150–400)
RBC: 5.16 MIL/uL (ref 4.22–5.81)
RDW: 14.6 % (ref 11.5–15.5)
WBC: 6.9 10*3/uL (ref 4.0–10.5)
nRBC: 0 % (ref 0.0–0.2)

## 2019-12-03 LAB — COMPREHENSIVE METABOLIC PANEL
ALT: 11 U/L (ref 0–44)
AST: 22 U/L (ref 15–41)
Albumin: 3.7 g/dL (ref 3.5–5.0)
Alkaline Phosphatase: 89 U/L (ref 38–126)
Anion gap: 12 (ref 5–15)
BUN: 7 mg/dL (ref 6–20)
CO2: 20 mmol/L — ABNORMAL LOW (ref 22–32)
Calcium: 9 mg/dL (ref 8.9–10.3)
Chloride: 107 mmol/L (ref 98–111)
Creatinine, Ser: 1.1 mg/dL (ref 0.61–1.24)
GFR calc Af Amer: >60 mL/min (ref >60)
GFR calc non Af Amer: >60 mL/min (ref >60)
Glucose, Bld: 135 mg/dL — ABNORMAL HIGH (ref 70–99)
Potassium: 3.9 mmol/L (ref 3.5–5.1)
Sodium: 139 mmol/L (ref 135–145)
Total Bilirubin: 0.5 mg/dL (ref 0.3–1.2)
Total Protein: 6.8 g/dL (ref 6.5–8.1)

## 2019-12-03 LAB — URINE DRUG SCREEN, QUALITATIVE (ARMC ONLY)
Amphetamines, Ur Screen: NOT DETECTED
Barbiturates, Ur Screen: NOT DETECTED
Benzodiazepine, Ur Scrn: NOT DETECTED
Cannabinoid 50 Ng, Ur ~~LOC~~: NOT DETECTED
Cocaine Metabolite,Ur ~~LOC~~: POSITIVE — AB
MDMA (Ecstasy)Ur Screen: NOT DETECTED
Methadone Scn, Ur: NOT DETECTED
Opiate, Ur Screen: NOT DETECTED
Phencyclidine (PCP) Ur S: NOT DETECTED
Tricyclic, Ur Screen: NOT DETECTED

## 2019-12-03 LAB — SARS CORONAVIRUS 2 BY RT PCR (HOSPITAL ORDER, PERFORMED IN ~~LOC~~ HOSPITAL LAB): SARS Coronavirus 2: NEGATIVE

## 2019-12-03 LAB — SALICYLATE LEVEL: Salicylate Lvl: <7 mg/dL — ABNORMAL LOW (ref 7.0–30.0)

## 2019-12-03 LAB — ETHANOL: Alcohol, Ethyl (B): <10 mg/dL (ref <10)

## 2019-12-03 LAB — ACETAMINOPHEN LEVEL: Acetaminophen (Tylenol), Serum: <10 ug/mL — ABNORMAL LOW (ref 10–30)

## 2019-12-03 MED ORDER — FAMOTIDINE 20 MG PO TABS
20.0000 mg | ORAL_TABLET | Freq: Once | ORAL | Status: AC
  Administered 2019-12-03: 20 mg via ORAL
  Filled 2019-12-03: qty 1

## 2019-12-03 MED ORDER — PANTOPRAZOLE SODIUM 40 MG PO TBEC
40.0000 mg | DELAYED_RELEASE_TABLET | Freq: Every day | ORAL | Status: DC
  Administered 2019-12-03 – 2019-12-05 (×3): 40 mg via ORAL
  Filled 2019-12-03 (×3): qty 1

## 2019-12-03 MED ORDER — ALUM & MAG HYDROXIDE-SIMETH 200-200-20 MG/5ML PO SUSP
15.0000 mL | Freq: Once | ORAL | Status: AC
  Administered 2019-12-03: 15 mL via ORAL
  Filled 2019-12-03: qty 30

## 2019-12-03 NOTE — ED Notes (Signed)
INVOLUNTARY with all papers on chart/awaiting TTS/PSYCH consult ?
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
## 2019-12-03 NOTE — ED Triage Notes (Signed)
Pt comes POV for HI toward "anyone that is getting on my nerves" due to relationships, work, living conditions. Pt calm and cooperative but tearful.

## 2019-12-03 NOTE — ED Notes (Signed)
Pt given meal tray.

## 2019-12-03 NOTE — ED Notes (Signed)
Pt brought into ED BHU via sally port and wand with metal detector for safety by [facility]. Patient oriented to unit/care area: Pt informed of unit policies and procedures.  Informed that, for their safety, care areas are designed for safety and monitored by security cameras at all times. Patient verbalizes understanding, and verbal contract for safety obtained.Pt shown to their room.

## 2019-12-03 NOTE — ED Notes (Addendum)
Pt belongings include brown shirt, one pair jeans, two black shoes, one red cell phone, one pair underwear, two socks, one belt, and black mask.

## 2019-12-03 NOTE — ED Provider Notes (Signed)
[facility] Emergency Department Provider Note  ____________________________________________   First MD Initiated Contact with Patient 12/03/19 1423     (approximate)  I have reviewed the triage vital signs and the nursing notes.   HISTORY  Chief Complaint Psychiatric Evaluation    HPI Samuel Molina is a 59 y.o. male history of acid reflux  Patient reports that for the last several weeks when people are aggravating to him he has serious thoughts that he is going to have to hurt someone.  Feels it towards anybody would be at risk of killing somebody.  Denies owning a gun or any weapons, and reports that she been feeling this way for several weeks and also been struggling with depression.  Denies substance abuse.  He does also report that he has terrible acid reflux, this is been an ongoing issue for a long time and has been evaluated in the ER by GI for this as well but nothing seems to improve those symptoms.Marland Kitchen  He is here today for mental health concerns that he might kill someone.  Does not actively want to harm anyone at this point, but does report when he is with people or at work for instance he will get aggravated and feel like he is going to hurt someone   Past Medical History:  Diagnosis Date  . GERD (gastroesophageal reflux disease)   . Obesity     Patient Active Problem List   Diagnosis Date Noted  . Gastroesophageal reflux disease with esophagitis   . Morbid obesity ([place]) 09/19/2017    Past Surgical History:  Procedure Laterality Date  . ESOPHAGOGASTRODUODENOSCOPY (EGD) WITH PROPOFOL N/A 09/20/2017   Procedure: ESOPHAGOGASTRODUODENOSCOPY (EGD) WITH PROPOFOL;  Surgeon: Lin Landsman, MD;  Location: [place];  Service: Gastroenterology;  Laterality: N/A;  . KNEE SURGERY Right     Prior to Admission medications   Medication Sig Start Date End Date Taking? Authorizing Provider  ibuprofen (ADVIL) 800 MG tablet Take 1 tablet  (800 mg total) by mouth every 8 (eight) hours as needed for moderate pain. 10/20/19   Sable Feil, PA-C  omeprazole (PRILOSEC) 40 MG capsule Take 1 capsule (40 mg total) by mouth 2 (two) times daily for 14 days. 09/21/17 10/05/17  Lin Landsman, MD  ondansetron (ZOFRAN ODT) 4 MG disintegrating tablet Allow 1-2 tablets to dissolve in your mouth every 8 hours as needed for nausea/vomiting 09/04/17   Hinda Kehr, MD  oxyCODONE (OXYCONTIN) 10 mg 12 hr tablet Take 1 tablet (10 mg total) by mouth every 12 (twelve) hours. 10/20/19   Sable Feil, PA-C  omeprazole (PRILOSEC OTC) 20 MG tablet Take 2 tablets (40 mg total) by mouth 2 (two) times daily before a meal. 09/19/17 01/29/19  Lin Landsman, MD    Allergies Acetaminophen  History reviewed. No pertinent family history.  Social History Social History   Tobacco Use  . Smoking status: Current Every Day Smoker    Packs/day: 0.50    Types: Cigarettes  . Smokeless tobacco: Never Used  Vaping Use  . Vaping Use: Never used  Substance Use Topics  . Alcohol use: Not Currently  . Drug use: Never    Review of Systems Constitutional: No fever/chills Eyes: No visual changes. ENT: No sore throat. Cardiovascular: Denies chest pain. Respiratory: Denies shortness of breath. Gastrointestinal: No abdominal pain.  Reports belches and takes acid in the back of his throat all the time. Genitourinary: Negative for dysuria. Musculoskeletal: Negative for back pain. Skin:  Negative for rash. Neurological: Negative for headaches, areas of focal weakness or numbness. Psychiatric: Denies wanting to hurt himself, but states that he gets so aggravated with people lately that he just wants to kill them and feels like he might actually take action on that at some point    ____________________________________________   PHYSICAL EXAM:  VITAL SIGNS: ED Triage Vitals  Enc Vitals Group     BP 12/03/19 1406 (!) 147/68     Pulse Rate 12/03/19 1406  63     Resp 12/03/19 1406 18     Temp 12/03/19 1406 98.8 F (37.1 C)     Temp Source 12/03/19 1406 Oral     SpO2 12/03/19 1406 99 %     Weight 12/03/19 1402 210 lb (95.3 kg)     Height 12/03/19 1402 6\' 1"  (1.854 m)     Head Circumference --      Peak Flow --      Pain Score 12/03/19 1402 0     Pain Loc --      Pain Edu? --      Excl. in [place]? --     Constitutional: Alert and oriented. Well appearing and in no acute distress.  Sitting comfortably in chair.  Burping frequently. Eyes: Conjunctivae are normal. Head: Atraumatic. Nose: No congestion/rhinnorhea. Mouth/Throat: Mucous membranes are moist. Neck: No stridor.  Cardiovascular: Good peripheral circulation. Respiratory: Normal respiratory effort.  No retractions.  Gastrointestinal: No distention. Musculoskeletal: No lower extremity tenderness nor edema. Neurologic:  Normal speech and language. No gross focal neurologic deficits are appreciated.  Skin:  Skin is warm, dry and intact. No rash noted. Psychiatric: Mood and affect are slightly agitated, a little elevated, reports homicidal ideations.  ____________________________________________   LABS (all labs ordered are listed, but only abnormal results are displayed)  Labs Reviewed  SALICYLATE LEVEL - Abnormal; Notable for the following components:      Result Value   Salicylate Lvl <5.3 (*)    All other components within normal limits  ACETAMINOPHEN LEVEL - Abnormal; Notable for the following components:   Acetaminophen (Tylenol), Serum <10 (*)    All other components within normal limits  CBC  COMPREHENSIVE METABOLIC PANEL  ETHANOL  URINE DRUG SCREEN, QUALITATIVE ([facility] ONLY)   ____________________________________________  EKG   ____________________________________________  RADIOLOGY   ____________________________________________   PROCEDURES  Procedure(s) performed: None  Procedures  Critical Care performed:  No  ____________________________________________   INITIAL IMPRESSION / ASSESSMENT AND PLAN / ED COURSE  Pertinent labs & imaging results that were available during my care of the patient were reviewed by me and considered in my medical decision making (see chart for details).   Patient presents for aggressive feelings of a feeling like he may potentially try to kill someone if they are to agitate him.  He does not seem to take any action on these thoughts, denies owning a gun or weapons, but given the history and his report that he feels like he is at serious risk of harming someone I will place him under involuntary commitment pending psychiatric evaluation.  He denies any acute medical illness.  Does not have evidence by exam acute medical illness other than frequent belching which he reports is been present for months and has had GI follow-up and endoscopy etc.  Provide antacids here in the ER.  He is alert, well-oriented, no acute distress.    ----------------------------------------- 2:57 PM on 12/03/2019 -----------------------------------------  Ongoing care assigned to Dr. Rip Harbour.  Follow-up on  recommendations from psychiatry.  Also awaiting urine drug screen and metabolic panel result.  Patient is under IVC  ____________________________________________   FINAL CLINICAL IMPRESSION(S) / ED DIAGNOSES  Final diagnoses:  Homicidal thoughts        Note:  This document was prepared using Dragon voice recognition software and may include unintentional dictation errors       Delman Kitten, MD 12/03/19 1458

## 2019-12-03 NOTE — ED Triage Notes (Signed)
First nurse note- here for psych eval, denies SI

## 2019-12-03 NOTE — ED Notes (Signed)

## 2019-12-03 NOTE — ED Notes (Signed)
Pt denies SI/AVH but endorsing HI on assessment. Pt reports he gets thoughts of harming others whenever they get on his nerves but currently he does not have them. Awaiting psych consult.

## 2019-12-04 NOTE — Consult Note (Signed)
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 male patient admitted with ---.adjustment issues and anger.   He --is on IVC after voicing HI but it was more symbolic he said ---and he is not actively homicidal  He says that he won 120,000 in the lottery we are not s ure if he is delusional or if it is real but got frustrated with people from the past asking for money ---He will not let us call his wife at this time.    He was argumentative about being on an IVC but then calmed down with this MD and the TTS ---  He wants to decompress here and then come up with an outpatient plan with TTS in the am  He remains on IVC until then for now   He has no substance drug or ETOH issues No court or Legal problems  No past Psych history   No other psych social stressors  He works as a Animator for a club ---    No previous psych history   No ETOH substances, drug or ETOH----  HPI: ---No previous ---psych history has had triggers with his work,   Past Psychiatric History: none   Risk to Self: Suicidal Ideation: No Suicidal Intent: No Is patient at risk for suicide?: No Suicidal Plan?: No Access to Means: No What has been your use of drugs/alcohol within the last 12 months?: "Controlled Substance" How many times?: 0 Other Self Harm Risks: None Triggers for Past Attempts: None known Intentional Self Injurious Behavior: None Risk to Others: Homicidal Ideation: Yes-Currently Present Thoughts of Harm to Others: Yes-Currently Present Comment - Thoughts of Harm to Others: "Anybody that is gettin on my nerves" Current Homicidal Intent: No Current Homicidal Plan: No Access to  Homicidal Means:  (Unknown) Identified Victim: None History of harm to others?: No Assessment of Violence: None Noted Violent Behavior Description: None Does patient have access to weapons?:  (Unknown) Criminal Charges Pending?: No Does patient have a court date: No Prior Inpatient Therapy: Prior Inpatient Therapy: No Prior Outpatient Therapy: Prior Outpatient Therapy: No Does patient have an ACCT team?: No Does patient have Intensive In-House Services?  : No Does patient have [facility] services? : No Does patient have P4CC services?: No  Past Medical History:  Past Medical History:  Diagnosis Date   GERD (gastroesophageal reflux disease)    Obesity     Past Surgical History:  Procedure Laterality Date   ESOPHAGOGASTRODUODENOSCOPY (EGD) WITH PROPOFOL N/A 09/20/2017   Procedure: ESOPHAGOGASTRODUODENOSCOPY (EGD) WITH PROPOFOL;  Surgeon: Lin Landsman, MD;  Location: [facility] ENDOSCOPY;  Service: Gastroenterology;  Laterality: N/A;   KNEE SURGERY Right    Family History: History reviewed. No pertinent family history. Family Psychiatric  History:  None reported  Social History:  Social History   Substance and Sexual Activity  Alcohol Use Not Currently     Social History   Substance and Sexual Activity  Drug Use Never    Social History   Socioeconomic History   Marital status: Single    Spouse name: Not on file   Number of children: Not on file   Years of education: Not on file   Highest education level: Not  on file  Occupational History   Not on file  Tobacco Use   Smoking status: Current Every Day Smoker    Packs/day: 0.50    Types: Cigarettes   Smokeless tobacco: Never Used  Vaping Use   Vaping Use: Never used  Substance and Sexual Activity   Alcohol use: Not Currently   Drug use: Never   Sexual activity: Not on file  Other Topics Concern   Not on file  Social History Narrative   Not on file   Social Determinants of Health   Financial  Resource Strain:    Difficulty of Paying Living Expenses:   Food Insecurity:    Worried About Charity fundraiser in the Last Year:    Arboriculturist in the Last Year:   Transportation Needs:    Film/video editor (Medical):    Lack of Transportation (Non-Medical):   Physical Activity:    Days of Exercise per Week:    Minutes of Exercise per Session:   Stress:    Feeling of Stress :   Social Connections:    Frequency of Communication with Friends and Family:    Frequency of Social Gatherings with Friends and Family:    Attends Religious Services:    Active Member of Clubs or Organizations:    Attends Archivist Meetings:    Marital Status:    Additional Social History:    Allergies:   Allergies  Allergen Reactions   Acetaminophen Rash, Itching and Nausea And Vomiting    Labs:  Results for orders placed or performed during the hospital encounter of 12/03/19 (from the past 48 hour(s))  Comprehensive metabolic panel     Status: Abnormal   Collection Time: 12/03/19  2:09 PM  Result Value Ref Range   Sodium 139 135 - 145 mmol/L   Potassium 3.9 3.5 - 5.1 mmol/L   Chloride 107 98 - 111 mmol/L   CO2 20 (L) 22 - 32 mmol/L   Glucose, Bld 135 (H) 70 - 99 mg/dL    Comment: Glucose reference range applies only to samples taken after fasting for at least 8 hours.   BUN 7 6 - 20 mg/dL   Creatinine, Ser 1.10 0.61 - 1.24 mg/dL   Calcium 9.0 8.9 - 10.3 mg/dL   Total Protein 6.8 6.5 - 8.1 g/dL   Albumin 3.7 3.5 - 5.0 g/dL   AST 22 15 - 41 U/L   ALT 11 0 - 44 U/L   Alkaline Phosphatase 89 38 - 126 U/L   Total Bilirubin 0.5 0.3 - 1.2 mg/dL   GFR calc non Af Amer >60 >60 mL/min   GFR calc Af Amer >60 >60 mL/min   Anion gap 12 5 - 15    Comment: Performed at [facility], [address], [place], [place] [address]  Ethanol     Status: None   Collection Time: 12/03/19  2:09 PM  Result Value Ref Range   Alcohol, Ethyl (B) <10 <10 mg/dL     Comment: (NOTE) Lowest detectable limit for serum alcohol is 10 mg/dL.  For medical purposes only. Performed at [facility], [address] [place], [place] [address]   Salicylate level     Status: Abnormal   Collection Time: 12/03/19  2:09 PM  Result Value Ref Range   Salicylate Lvl <3.3 (L) 7.0 - 30.0 mg/dL    Comment: Performed at [facility], [address] [place],  [address]  Acetaminophen level  Status: Abnormal   Collection Time: 12/03/19  2:09 PM  Result Value Ref Range   Acetaminophen (Tylenol), Serum <10 (L) 10 - 30 ug/mL    Comment: (NOTE) Therapeutic concentrations vary significantly. A range of 10-30 ug/mL  may be an effective concentration for many patients. However, some  are best treated at concentrations outside of this range. Acetaminophen concentrations >150 ug/mL at 4 hours after ingestion  and >50 ug/mL at 12 hours after ingestion are often associated with  toxic reactions.  Performed at [facility], [address] [place], [place] [address]   cbc     Status: None   Collection Time: 12/03/19  2:09 PM  Result Value Ref Range   WBC 6.9 4.0 - 10.5 K/uL   RBC 5.16 4.22 - 5.81 MIL/uL   Hemoglobin 13.8 13.0 - 17.0 g/dL   HCT 43.4 39 - 52 %   MCV 84.1 80.0 - 100.0 fL   MCH 26.7 26.0 - 34.0 pg   MCHC 31.8 30.0 - 36.0 g/dL   RDW 14.6 11.5 - 15.5 %   Platelets 288 150 - 400 K/uL   nRBC 0.0 0.0 - 0.2 %    Comment: Performed at [facility], [address], [place], [place] [address]  Urine Drug Screen, Qualitative     Status: Abnormal   Collection Time: 12/03/19  2:10 PM  Result Value Ref Range   Tricyclic, Ur Screen NONE DETECTED NONE DETECTED   Amphetamines, Ur Screen NONE DETECTED NONE DETECTED   MDMA (Ecstasy)Ur Screen NONE DETECTED NONE DETECTED   Cocaine Metabolite,Ur [place] POSITIVE (A) NONE DETECTED   Opiate, Ur Screen NONE DETECTED NONE DETECTED   Phencyclidine (PCP) Ur S NONE DETECTED NONE  DETECTED   Cannabinoid 50 Ng, Ur [place] NONE DETECTED NONE DETECTED   Barbiturates, Ur Screen NONE DETECTED NONE DETECTED   Benzodiazepine, Ur Scrn NONE DETECTED NONE DETECTED   Methadone Scn, Ur NONE DETECTED NONE DETECTED    Comment: (NOTE) Tricyclics + metabolites, urine    Cutoff 1000 ng/mL Amphetamines + metabolites, urine  Cutoff 1000 ng/mL MDMA (Ecstasy), urine              Cutoff 500 ng/mL Cocaine Metabolite, urine          Cutoff 300 ng/mL Opiate + metabolites, urine        Cutoff 300 ng/mL Phencyclidine (PCP), urine         Cutoff 25 ng/mL Cannabinoid, urine                 Cutoff 50 ng/mL Barbiturates + metabolites, urine  Cutoff 200 ng/mL Benzodiazepine, urine              Cutoff 200 ng/mL Methadone, urine                   Cutoff 300 ng/mL  The urine drug screen provides only a preliminary, unconfirmed analytical test result and should not be used for non-medical purposes. Clinical consideration and professional judgment should be applied to any positive drug screen result due to possible interfering substances. A more specific alternate chemical method must be used in order to obtain a confirmed analytical result. Gas chromatography / mass spectrometry (GC/MS) is the preferred confirm atory method. Performed at [facility], [address] [place], [place] [address]   SARS Coronavirus 2 by RT PCR (hospital order, performed in [place] lab) Nasopharyngeal Nasopharyngeal Swab     Status: None   Collection Time:  12/03/19  8:12 PM   Specimen: Nasopharyngeal Swab  Result Value Ref Range   SARS Coronavirus 2 NEGATIVE NEGATIVE    Comment: (NOTE) SARS-CoV-2 target nucleic acids are NOT DETECTED.  The SARS-CoV-2 RNA is generally detectable in upper and lower respiratory specimens during the acute phase of infection. The lowest concentration of SARS-CoV-2 viral copies this assay can detect is 250 copies / mL. A negative result does not preclude SARS-CoV-2  infection and should not be used as the sole basis for treatment or other patient management decisions.  A negative result may occur with improper specimen collection / handling, submission of specimen other than nasopharyngeal swab, presence of viral mutation(s) within the areas targeted by this assay, and inadequate number of viral copies (<250 copies / mL). A negative result must be combined with clinical observations, patient history, and epidemiological information.  Fact Sheet for Patients:   StrictlyIdeas.no  Fact Sheet for Healthcare Providers: BankingDealers.co.za  This test is not yet approved or  cleared by the [place] FDA and has been authorized for detection and/or diagnosis of SARS-CoV-2 by FDA under an Emergency Use Authorization (EUA).  This EUA will remain in effect (meaning this test can be used) for the duration of the COVID-19 declaration under Section 564(b)(1) of the Act, 21 U.S.C. section 360bbb-3(b)(1), unless the authorization is terminated or revoked sooner.  Performed at [facility], [address] [place], [place] [address]     Current Facility-Administered Medications  Medication Dose Route Frequency Provider Last Rate Last Admin   pantoprazole (PROTONIX) EC tablet 40 mg  40 mg Oral Daily Delman Kitten, MD   40 mg at 12/04/19 1124   No current outpatient medications on file.    Musculoskeletal: Strength & Muscle Tone: normal  Gait & Station: normal  Patient leans: normal   Psychiatric Specialty Exam: Physical Exam  Review of Systems  Blood pressure (!) 147/68, pulse 63, temperature 98.8 F (37.1 C), temperature source Oral, resp. rate 18, height 6\' 1"  (1.854 m), weight 95.3 kg, SpO2 99 %.Body mass index is 27.71 kg/m.  Mental Status  AA male at first angry and argumentative but then calmed down Oriented times four Consciousness not clouded or fluctuant Concentration and  attention okay Mood and affect improved Thought process and content --no frank psychosis or mania Judgement insight reliability fair No movement problems Rapport and eye contact fair Memory remote recent and immediate okay through general questions Abstraction, recall, cognition okay Speech somewhat loud pressured Intelligence fund of knowledge okay Si and HI contracts for safety  Aims not done Leans  Not applicable Assets --working ADL's normal                                                           Treatment Plan Summary:  AA male with adjustment issues who wanted to come to ER to decompress and find outpatient help   He has no active SI and HI and was angry he was placed on IVC for HI   It was explained that ER docs will do this when they need to err on the side of caution based on patient issues behaviors and history  He later calmed down and asks for obs for one day or so to come up with an outpatient plan  Not clear  if lottery win is delusional ---however he will not allow [place] to call wife/GF       Disposition:    Remains in [place] overnight  He refuses all meds ---he is not in imminent danger to Self or others   Eulas Post, MD 12/04/2019 4:59 PM

## 2019-12-04 NOTE — BH Assessment (Signed)
Writer spoke with the patient to complete an updated/reassessment. Patient denies SI/HI and AV/H. He states he told ER staff he wanted to strangle or hurt others that get on his nerves as a way of expressing his frustration. However, he has no desire, intent or thoughts of hurting anyone. He further explained, he recently won a substantial amount of money and family, friends and other people he knows have been contacting him asking for money or telling him sad stories trying to get money. He went to a hotel to get away but family found where he was and started sending him messages again. He states he was getting overwhelmed and was afraid he was going to say something too hurtful and it was going to damage his close relationships. Patient admits to having a "sarcastic mouth." He also reports, he believes his lack of sleep for several days, alone with the increase of phone calls has caused him to be more irritable and agitated. He hasn't slept as much because he was in [facility] having fun with his brother and their mutual friends.  During the interview the patient was calm, cooperative and pleasant. He was able to provide appropriate answers to the questions. He initially was upset and frustrated about being under IVC but when he was giving the space to voice and explain his frustration, he was able to deescalate and engaged in the interview.

## 2019-12-04 NOTE — ED Notes (Signed)
Snack tray and drink provided

## 2019-12-04 NOTE — ED Notes (Signed)
Pt given dinner tray and sprite.  

## 2019-12-05 MED ORDER — NAPROXEN 500 MG PO TABS
500.0000 mg | ORAL_TABLET | Freq: Once | ORAL | Status: AC
  Administered 2019-12-05: 500 mg via ORAL
  Filled 2019-12-05 (×2): qty 1

## 2019-12-05 MED ORDER — METHOCARBAMOL 500 MG PO TABS
500.0000 mg | ORAL_TABLET | Freq: Once | ORAL | Status: AC
  Administered 2019-12-05: 500 mg via ORAL
  Filled 2019-12-05 (×2): qty 1

## 2019-12-05 NOTE — ED Notes (Signed)
Pt discharged home. VS stable. Discharge instructions reviewed with patient. Pt signed for discharge. All belongings returned to pt. Pt denies SI.

## 2019-12-05 NOTE — Final Progress Note (Signed)
Physician Final Progress Note  Patient ID: Neema Fluegge MRN: [id number] DOB/AGE: Apr 18, 1961 59 y.o.  Admit date: 12/03/2019 Admitting provider: No admitting provider for patient encounter. 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  He never had a real plan for HI or HI or issues   He decided to safely discharge and did not want meds   He felt his crisis with friends and family improved   Alert cooperative oriented to person place and time Not clouded or fluctuant Mood and affect normal No other psychosis or mania for thought content and process Appearance normal Rapport --poor severe devaluing and insulting  Eye contact poor  Memory remote recent and immediate intact Fund of knowledge intelligence average Judgement insight reliability fair to poor No movement problems Si and HI none Abstraction fair Speech loud and pressured condescending  Aims not done Cognition strange Recall okay Language normal Movements /musculoskeletal okay Sleep normal Assets not known ADL's okay handedness and leaning --not known   Sent home at his request  IVC rescinded   Total time spent taking care of this patient:  Close to one hour   Signed: Eulas Post 12/05/2019, 6:16 PM

## 2019-12-05 NOTE — ED Provider Notes (Signed)
Emergency Medicine Observation Re-evaluation Note  Elgar Scoggins is a 59 y.o. male, seen on rounds today.  Pt initially presented to the ED for complaints of Psychiatric Evaluation Currently, the patient is resting.  Physical Exam  BP (!) 147/119 (BP Location: Left Arm)   Pulse (!) 57   Temp 98.5 F (36.9 C) (Oral)   Resp 16   Ht 6\' 1"  (1.854 m)   Wt 95.3 kg   SpO2 100%   BMI 27.71 kg/m  Physical Exam Constitutional:      Appearance: He is not ill-appearing or toxic-appearing.  HENT:     Head: Atraumatic.  Eyes:     Extraocular Movements: Extraocular movements intact.     Pupils: Pupils are equal, round, and reactive to light.  Cardiovascular:     Rate and Rhythm: Normal rate.  Pulmonary:     Effort: Pulmonary effort is normal.  Musculoskeletal:        General: No signs of injury.  Skin:    General: Skin is warm and dry.  Neurological:     General: No focal deficit present.     ED Course / MDM  EKG:    I have reviewed the labs performed to date as well as medications administered while in observation.  Recent changes in the last 24 hours include continued bed search. Plan  Current plan is for inpatient psychiatric bed placement. Patient is under full IVC at this time.   Vladimir Crofts, MD 12/05/19 [phone]
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
## 2019-12-05 NOTE — BH Assessment (Signed)
Writer spoke with the patient to complete an updated/reassessment. Patient denies SI/HI and AV/H. 

## 2020-01-01 ENCOUNTER — Other Ambulatory Visit: Payer: Self-pay

## 2020-01-01 ENCOUNTER — Emergency Department: Payer: Medicaid Other

## 2020-01-01 ENCOUNTER — Emergency Department
Admission: EM | Admit: 2020-01-01 | Discharge: 2020-01-03 | Disposition: A | Discharge status: Home / Self Care | Payer: Medicaid Other | Attending: Emergency Medicine | Admitting: Emergency Medicine

## 2020-01-01 ENCOUNTER — Encounter: Payer: Self-pay | Admitting: Emergency Medicine

## 2020-01-01 DIAGNOSIS — Z20822 Contact with and (suspected) exposure to covid-19: Secondary | ICD-10-CM | POA: Insufficient documentation

## 2020-01-01 DIAGNOSIS — R45850 Homicidal ideations: Secondary | ICD-10-CM | POA: Insufficient documentation

## 2020-01-01 DIAGNOSIS — F17210 Nicotine dependence, cigarettes, uncomplicated: Secondary | ICD-10-CM | POA: Insufficient documentation

## 2020-01-01 DIAGNOSIS — M25552 Pain in left hip: Secondary | ICD-10-CM | POA: Insufficient documentation

## 2020-01-01 LAB — COMPREHENSIVE METABOLIC PANEL
ALT: 11 U/L (ref 0–44)
AST: 17 U/L (ref 15–41)
Albumin: 3.7 g/dL (ref 3.5–5.0)
Alkaline Phosphatase: 118 U/L (ref 38–126)
Anion gap: 10 (ref 5–15)
BUN: 12 mg/dL (ref 6–20)
CO2: 24 mmol/L (ref 22–32)
Calcium: 9 mg/dL (ref 8.9–10.3)
Chloride: 104 mmol/L (ref 98–111)
Creatinine, Ser: 1.09 mg/dL (ref 0.61–1.24)
GFR calc Af Amer: >60 mL/min (ref >60)
GFR calc non Af Amer: >60 mL/min (ref >60)
Glucose, Bld: 126 mg/dL — ABNORMAL HIGH (ref 70–99)
Potassium: 4.2 mmol/L (ref 3.5–5.1)
Sodium: 138 mmol/L (ref 135–145)
Total Bilirubin: 0.6 mg/dL (ref 0.3–1.2)
Total Protein: 7.2 g/dL (ref 6.5–8.1)

## 2020-01-01 LAB — CBC
HCT: 44.7 % (ref 39.0–52.0)
Hemoglobin: 14.4 g/dL (ref 13.0–17.0)
MCH: 26.9 pg (ref 26.0–34.0)
MCHC: 32.2 g/dL (ref 30.0–36.0)
MCV: 83.4 fL (ref 80.0–100.0)
Platelets: 300 10*3/uL (ref 150–400)
RBC: 5.36 MIL/uL (ref 4.22–5.81)
RDW: 14.9 % (ref 11.5–15.5)
WBC: 8.1 10*3/uL (ref 4.0–10.5)
nRBC: 0 % (ref 0.0–0.2)

## 2020-01-01 LAB — SARS CORONAVIRUS 2 BY RT PCR (HOSPITAL ORDER, PERFORMED IN ~~LOC~~ HOSPITAL LAB): SARS Coronavirus 2: NEGATIVE

## 2020-01-01 LAB — URINE DRUG SCREEN, QUALITATIVE (ARMC ONLY)
Amphetamines, Ur Screen: NOT DETECTED
Barbiturates, Ur Screen: NOT DETECTED
Benzodiazepine, Ur Scrn: NOT DETECTED
Cannabinoid 50 Ng, Ur ~~LOC~~: NOT DETECTED
Cocaine Metabolite,Ur ~~LOC~~: POSITIVE — AB
MDMA (Ecstasy)Ur Screen: NOT DETECTED
Methadone Scn, Ur: NOT DETECTED
Opiate, Ur Screen: NOT DETECTED
Phencyclidine (PCP) Ur S: NOT DETECTED
Tricyclic, Ur Screen: NOT DETECTED

## 2020-01-01 LAB — SALICYLATE LEVEL: Salicylate Lvl: <7 mg/dL — ABNORMAL LOW (ref 7.0–30.0)

## 2020-01-01 LAB — ACETAMINOPHEN LEVEL: Acetaminophen (Tylenol), Serum: <10 ug/mL — ABNORMAL LOW (ref 10–30)

## 2020-01-01 LAB — ETHANOL: Alcohol, Ethyl (B): <10 mg/dL (ref <10)

## 2020-01-01 MED ORDER — OXYCODONE HCL 5 MG PO TABS
5.0000 mg | ORAL_TABLET | Freq: Once | ORAL | Status: AC
  Administered 2020-01-01: 5 mg via ORAL
  Filled 2020-01-01: qty 1

## 2020-01-01 MED ORDER — ALUM & MAG HYDROXIDE-SIMETH 200-200-20 MG/5ML PO SUSP
30.0000 mL | Freq: Four times a day (QID) | ORAL | Status: DC | PRN
  Administered 2020-01-03: 30 mL via ORAL
  Filled 2020-01-01 (×2): qty 30

## 2020-01-01 MED ORDER — ONDANSETRON HCL 4 MG PO TABS
4.0000 mg | ORAL_TABLET | Freq: Three times a day (TID) | ORAL | Status: DC | PRN
  Administered 2020-01-01: 4 mg via ORAL
  Filled 2020-01-01: qty 1

## 2020-01-01 NOTE — ED Notes (Signed)
Pt given food tray at this time with cheeseburger, fries, and drink at this time. Patient pleasant and cooperative at this, no further needs noted.

## 2020-01-01 NOTE — ED Notes (Signed)
Pt was "dressed out" while in triage. Items removed and bagged include = one pair pants, one pair underwear, one shirt, one pair of socks, one cell phone, one belt, one pair of tennis shoes, one tshirt.
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
## 2020-01-01 NOTE — ED Notes (Signed)
Refused vitals 
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
## 2020-01-01 NOTE — ED Triage Notes (Signed)
Pt advised RN that he was also having homicidal ideation. Pt does not wish to discuss further at this time. Requesting BH eval
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
## 2020-01-01 NOTE — ED Notes (Signed)
IVC, psych consult pending

## 2020-01-02 MED ORDER — KETOROLAC TROMETHAMINE 10 MG PO TABS
10.0000 mg | ORAL_TABLET | Freq: Once | ORAL | Status: AC
  Administered 2020-01-02: 10 mg via ORAL
  Filled 2020-01-02: qty 1

## 2020-01-02 MED ORDER — IBUPROFEN 600 MG PO TABS
600.0000 mg | ORAL_TABLET | Freq: Once | ORAL | Status: AC
  Administered 2020-01-02: 600 mg via ORAL
  Filled 2020-01-02: qty 1

## 2020-01-02 MED ORDER — KETOROLAC TROMETHAMINE 30 MG/ML IJ SOLN
30.0000 mg | Freq: Once | INTRAMUSCULAR | Status: DC
  Filled 2020-01-02: qty 1

## 2020-01-02 NOTE — ED Notes (Signed)
Pt verbally aggressive to this nurse and Dr. Owens Shark over care. Pt angry due to MD over care during day and Dr. Owens Shark ordering IM Toradol. Pt refuses IM meds. Pt argumentative with Dr. Owens Shark questioning each thing Dr. Owens Shark states, requests his chart from day MD be read to him but becomes angry when read aloud stating, "I don't care what that mother fucker says, he didn't see me." This nurse attempts to assist pt with verbal deescalating with no success. Pt remains agitated and references his homicidal ideation in attempt to intimidate Dr. Owens Shark. Pt states to Dr. Owens Shark, "get the hell out my room, I don't give a damn what you have to say to me." Pt immediately takes PO med ordered and continues to question what Dr. Owens Shark was speaking of, this nurse attempts to explain in different view point. Pt continues to question and refute each statement made. Dr. Owens Shark returns to room in attempt to allow pt to be assessed as he is requesting but pt states, "don't you fucking pacify me." Dr. Owens Shark again exits room after pt yells at Dr. Owens Shark and attempts to again intimidate with the statement, "don't forget how homicidal I am." This nurse also informs pt he is exiting and shuts door behind pt. Pt immediately to door and swings it open, pt looking for Dr. Owens Shark and attempts to come out of room to nurses station following Dr. Owens Shark. This nurse redirects pt verbally and pt stops in hall. Pt returns to room and shuts door behind him. officer to room at this time to ensure safety.

## 2020-01-02 NOTE — ED Notes (Signed)
Assumed care of patient patient calm and cooperative, awaiting further plan of care. Patient denied SI/HV/HI. Safety maintained will monitor.

## 2020-01-02 NOTE — ED Notes (Signed)
This nurse assumes care of pt, pt in bed with TV on watching football game. No needs expressed at this time

## 2020-01-02 NOTE — ED Notes (Signed)
Hourly rounding reveals patient asleep in room. No complaints, stable, in no acute distress. Q15 minute rounds and monitoring via [employer] to continue.

## 2020-01-02 NOTE — ED Notes (Addendum)
Report to include Situation, Background, Assessment, and Recommendations received from [facility]. Patient alert and oriented, warm and dry, in no acute distress. Patient denies SI, HI, AVH. Patient made aware of Q15 minute rounds and security cameras for their safety. Patient instructed to come to me with needs or concerns.

## 2020-01-02 NOTE — ED Notes (Signed)
Hourly rounding reveals patient awake in day room. No complaints, stable, in no acute distress. Q15 minute rounds and monitoring via [employer] to continue.

## 2020-01-02 NOTE — BH Assessment (Addendum)
Writer and Psych MD (Dr. Janese Banks) attempted to complete reassessment with patient. However, he stated he had a "major headache" and rolled over. He stated he wasn't in the mood to talk.

## 2020-01-02 NOTE — ED Provider Notes (Signed)
Emergency Medicine Observation Re-evaluation Note  Samuel Molina is a 59 y.o. male, seen on rounds today.  Pt initially presented to the ED for complaints of Hip Pain Currently, the patient is resting comfortably.  Physical Exam  BP 139/74 (BP Location: Left Arm)   Pulse (!) 54   Temp 98 F (36.7 C) (Oral)   Resp 20   Ht 6\' 1"  (1.854 m)   Wt 97.5 kg   SpO2 99%   BMI 28.37 kg/m  Physical Exam Vitals and nursing note reviewed.  HENT:     Head: Normocephalic and atraumatic.     Right Ear: External ear normal.     Left Ear: External ear normal.     Nose: Nose normal.  Eyes:     Conjunctiva/sclera: Conjunctivae normal.  Cardiovascular:     Rate and Rhythm: Normal rate.  Pulmonary:     Effort: No respiratory distress.  Abdominal:     General: There is no distension.  Neurological:     Mental Status: He is alert.  Psychiatric:        Mood and Affect: Mood normal.      ED Course / MDM  EKG:    I have reviewed the labs performed to date as well as medications administered while in observation.  Recent changes in the last 24 hours include none.  Plan  Current plan is for psych eval. Pending formal recs. Patient is under full IVC at this time.   Lucrezia Starch, MD 01/02/20 [phone]

## 2020-01-02 NOTE — ED Notes (Signed)
IVC, pend pysch consult, moved to [facility]

## 2020-01-02 NOTE — ED Notes (Signed)
IVC  CONSULT  DONE  PENDING  PLACEMENT 

## 2020-01-02 NOTE — ED Notes (Signed)
IVC, pend psych consult, moved to [facility]

## 2020-01-02 NOTE — ED Notes (Signed)
Pt complains of increase in pain at this time, states prior med worked but has worn off. MD notified.

## 2020-01-02 NOTE — Progress Notes (Signed)
[facility] MD Progress Note  01/02/2020 3:21 PM Samuel Molina  MRN:  [id number] Subjective:    Go away ! Leave me alone do not bother me   I do not want to speak    Principal Problem: <principal problem not specified> Diagnosis: Active Problems:   * No active hospital problems. 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  Gives approximate answers and it is generally not clear why he comes.   Last time he said he won the lottery and was mad at many people coming for money and so he needed ER time to rest   He is very argumentative defensive belligerent  And vague  He is currently on IVC for vague SI and plans chronic hip pain and issues    He is cocaine positive---today in his typical pattern he refuses to speak.    His history is very sketchy  But last time went home and contracted for safety on his own   Unclear if he is homeless  We left message today on VM to his sister for further info     Past Psychiatric History: Previous visit to ER where he was in similar situation, left after a few days   Past Medical History:  Past Medical History:  Diagnosis Date  . GERD (gastroesophageal reflux disease)   . Obesity     Past Surgical History:  Procedure Laterality Date  . ESOPHAGOGASTRODUODENOSCOPY (EGD) WITH PROPOFOL N/A 09/20/2017   Procedure: ESOPHAGOGASTRODUODENOSCOPY (EGD) WITH PROPOFOL;  Surgeon: Lin Landsman, MD;  Location: [place];  Service: Gastroenterology;  Laterality: N/A;  . KNEE SURGERY Right    Family History: No family history on file. Family Psychiatric  History: too vague never answers  Social History:  Social History   Substance and Sexual Activity  Alcohol Use Not Currently     Social History   Substance and Sexual Activity  Drug Use Never     Social History   Socioeconomic History  . Marital status: Single    Spouse name: Not on file  . Number of children: Not on file  . Years of education: Not on file  . Highest education level: Not on file  Occupational History  . Not on file  Tobacco Use  . Smoking status: Current Every Day Smoker    Packs/day: 0.50    Types: Cigarettes  . Smokeless tobacco: Never Used  Vaping Use  . Vaping Use: Never used  Substance and Sexual Activity  . Alcohol use: Not Currently  . Drug use: Never  . Sexual activity: Not on file  Other Topics Concern  . Not on file  Social History Narrative  . Not on file   Social Determinants of Health   Financial Resource Strain:   . Difficulty of Paying Living Expenses: Not on file  Food Insecurity:   . Worried About Charity fundraiser in the Last Year: Not on file  . Ran Out of Food in the Last Year: Not on file  Transportation Needs:   . Lack of Transportation (Medical): Not on file  . Lack of Transportation (Non-Medical): Not on file  Physical Activity:   . Days of Exercise per Week: Not on file  . Minutes of Exercise per Session: Not on file  Stress:   . Feeling of Stress : Not on  file  Social Connections:   . Frequency of Communication with Friends and Family: Not on file  . Frequency of Social Gatherings with Friends and Family: Not on file  . Attends Religious Services: Not on file  . Active Member of Clubs or Organizations: Not on file  . Attends Archivist Meetings: Not on file  . Marital Status: Not on file   Additional Social History:    Pain Medications: See MAR Prescriptions: See MAR Over the Counter: See MAR History of alcohol / drug use?: Yes Name of Substance 1: Cocaine                  Sleep:  On and off he says   Appetite:  Fair   Current Medications: Current Facility-Administered Medications  Medication Dose Route Frequency Provider Last Rate Last Admin  . alum & mag hydroxide-simeth  (MAALOX/MYLANTA) 200-200-20 MG/5ML suspension 30 mL  30 mL Oral Q6H PRN Duffy Bruce, MD      . ondansetron [facility]) tablet 4 mg  4 mg Oral Q8H PRN Duffy Bruce, MD   4 mg at 01/01/20 2233   No current outpatient medications on file.    Lab Results:  Results for orders placed or performed during the hospital encounter of 01/01/20 (from the past 48 hour(s))  Comprehensive metabolic panel     Status: Abnormal   Collection Time: 01/01/20  3:40 PM  Result Value Ref Range   Sodium 138 135 - 145 mmol/L   Potassium 4.2 3.5 - 5.1 mmol/L   Chloride 104 98 - 111 mmol/L   CO2 24 22 - 32 mmol/L   Glucose, Bld 126 (H) 70 - 99 mg/dL    Comment: Glucose reference range applies only to samples taken after fasting for at least 8 hours.   BUN 12 6 - 20 mg/dL   Creatinine, Ser 1.09 0.61 - 1.24 mg/dL   Calcium 9.0 8.9 - 10.3 mg/dL   Total Protein 7.2 6.5 - 8.1 g/dL   Albumin 3.7 3.5 - 5.0 g/dL   AST 17 15 - 41 U/L   ALT 11 0 - 44 U/L   Alkaline Phosphatase 118 38 - 126 U/L   Total Bilirubin 0.6 0.3 - 1.2 mg/dL   GFR calc non Af Amer >60 >60 mL/min   GFR calc Af Amer >60 >60 mL/min   Anion gap 10 5 - 15    Comment: Performed at [facility], [address] [place], [place] [address]  Ethanol     Status: None   Collection Time: 01/01/20  3:40 PM  Result Value Ref Range   Alcohol, Ethyl (B) <10 <10 mg/dL    Comment: (NOTE) Lowest detectable limit for serum alcohol is 10 mg/dL.  For medical purposes only. Performed at [facility], [address] [place], [place] [address]   Salicylate level     Status: Abnormal   Collection Time: 01/01/20  3:40 PM  Result Value Ref Range   Salicylate Lvl <0.9 (L) 7.0 - 30.0 mg/dL    Comment: Performed at [facility], [address] [place], [place] [address]  Acetaminophen level     Status: Abnormal   Collection Time: 01/01/20  3:40 PM  Result Value Ref Range   Acetaminophen (Tylenol), Serum <10 (L) 10 - 30 ug/mL     Comment: (NOTE) Therapeutic concentrations vary significantly. A range of 10-30 ug/mL  may be an effective concentration for many patients. However, some  are best treated at  concentrations outside of this range. Acetaminophen concentrations >150 ug/mL at 4 hours after ingestion  and >50 ug/mL at 12 hours after ingestion are often associated with  toxic reactions.  Performed at [facility], [address] [place], [place] [address]   cbc     Status: None   Collection Time: 01/01/20  3:40 PM  Result Value Ref Range   WBC 8.1 4.0 - 10.5 K/uL   RBC 5.36 4.22 - 5.81 MIL/uL   Hemoglobin 14.4 13.0 - 17.0 g/dL   HCT 44.7 39 - 52 %   MCV 83.4 80.0 - 100.0 fL   MCH 26.9 26.0 - 34.0 pg   MCHC 32.2 30.0 - 36.0 g/dL   RDW 14.9 11.5 - 15.5 %   Platelets 300 150 - 400 K/uL   nRBC 0.0 0.0 - 0.2 %    Comment: Performed at [facility], [address], [place], [place] [address]  Urine Drug Screen, Qualitative     Status: Abnormal   Collection Time: 01/01/20  3:41 PM  Result Value Ref Range   Tricyclic, Ur Screen NONE DETECTED NONE DETECTED   Amphetamines, Ur Screen NONE DETECTED NONE DETECTED   MDMA (Ecstasy)Ur Screen NONE DETECTED NONE DETECTED   Cocaine Metabolite,Ur [place] POSITIVE (A) NONE DETECTED   Opiate, Ur Screen NONE DETECTED NONE DETECTED   Phencyclidine (PCP) Ur S NONE DETECTED NONE DETECTED   Cannabinoid 50 Ng, Ur [place] NONE DETECTED NONE DETECTED   Barbiturates, Ur Screen NONE DETECTED NONE DETECTED   Benzodiazepine, Ur Scrn NONE DETECTED NONE DETECTED   Methadone Scn, Ur NONE DETECTED NONE DETECTED    Comment: (NOTE) Tricyclics + metabolites, urine    Cutoff 1000 ng/mL Amphetamines + metabolites, urine  Cutoff 1000 ng/mL MDMA (Ecstasy), urine              Cutoff 500 ng/mL Cocaine Metabolite, urine          Cutoff 300 ng/mL Opiate + metabolites, urine        Cutoff 300 ng/mL Phencyclidine (PCP), urine         Cutoff 25 ng/mL Cannabinoid, urine                  Cutoff 50 ng/mL Barbiturates + metabolites, urine  Cutoff 200 ng/mL Benzodiazepine, urine              Cutoff 200 ng/mL Methadone, urine                   Cutoff 300 ng/mL  The urine drug screen provides only a preliminary, unconfirmed analytical test result and should not be used for non-medical purposes. Clinical consideration and professional judgment should be applied to any positive drug screen result due to possible interfering substances. A more specific alternate chemical method must be used in order to obtain a confirmed analytical result. Gas chromatography / mass spectrometry (GC/MS) is the preferred confirm atory method. Performed at [facility], [address] [place], [place] [address]   SARS Coronavirus 2 by RT PCR (hospital order, performed in [name] hospital lab) Nasopharyngeal Nasopharyngeal Swab     Status: None   Collection Time: 01/01/20  5:41 PM   Specimen: Nasopharyngeal Swab  Result Value Ref Range   SARS Coronavirus 2 NEGATIVE NEGATIVE    Comment: (NOTE) SARS-CoV-2 target nucleic acids are NOT DETECTED.  The SARS-CoV-2 RNA is generally detectable in upper and lower respiratory specimens during the acute phase of infection. The lowest concentration of  SARS-CoV-2 viral copies this assay can detect is 250 copies / mL. A negative result does not preclude SARS-CoV-2 infection and should not be used as the sole basis for treatment or other patient management decisions.  A negative result may occur with improper specimen collection / handling, submission of specimen other than nasopharyngeal swab, presence of viral mutation(s) within the areas targeted by this assay, and inadequate number of viral copies (<250 copies / mL). A negative result must be combined with clinical observations, patient history, and epidemiological information.  Fact Sheet for Patients:   StrictlyIdeas.no  Fact Sheet for Healthcare  Providers: BankingDealers.co.za  This test is not yet approved or  cleared by the [place] FDA and has been authorized for detection and/or diagnosis of SARS-CoV-2 by FDA under an Emergency Use Authorization (EUA).  This EUA will remain in effect (meaning this test can be used) for the duration of the COVID-19 declaration under Section 564(b)(1) of the Act, 21 U.S.C. section 360bbb-3(b)(1), unless the authorization is terminated or revoked sooner.  Performed at [facility], [address] [place], [place] [address]     Blood Alcohol level:  Lab Results  Component Value Date   [facility] <10 01/01/2020   ETH <10 24/12/7351    Metabolic Disorder Labs: No results found for: HGBA1C, MPG No results found for: PROLACTIN No results found for: CHOL, TRIG, HDL, CHOLHDL, VLDL, LDLCALC  Physical Findings: AIMS:  , ,  ,  ,   not done  CIWA:    COWS:     Musculoskeletal: Strength & Muscle Tone: normal  Gait & Station: limited by hip pain  Patient leans: n/a   Psychiatric Specialty Exam: Physical Exam Constitutional:      General: He is not in acute distress.    Review of Systems  Blood pressure 139/74, pulse (!) 54, temperature 98 F (36.7 C), temperature source Oral, resp. rate 20, height 6\' 1"  (1.854 m), weight 97.5 kg, SpO2 99 %.Body mass index is 28.37 kg/m.    Mental Status  Limited he is not cooperating Oriented times four Not speaking deliberately oppositional arugmentative  Rapport rude / no eye contact lying down in dark room telling us to go away  Speech loud and pressured Si and HI he does not anxwer Mood and affect ----angry edgy frustrated  Thought process and content --not clear if he has psychosis or mania Memory cannot assess Judgement insight reliability all poor Fund of knowledge and intelligence all below average No shakes tics tremors for movements Appearance at baseline somewhat haggard  Forlorn                                                     Recall poor Language English Akathisia ---none Handedness not known Aims not needed Assets not clear ADL's--not clear he is too vague Cognition poor Sleep on and off  Psychomotor --at times elevated         Treatment Plan Summary:   AA male with Narcissistic personality possible psychosis and post cocaine intoxication with migraine  Remains on IVC as he refuses to speak --which is his usual pattern  Awaits disposition   Message left for sister to call back    Eulas Post, MD 01/02/2020, 3:21 PM

## 2020-01-02 NOTE — ED Notes (Signed)
Hourly rounding reveals patient awake in room. No complaints, stable, in no acute distress. Q15 minute rounds and monitoring via Security Cameras to continue. 

## 2020-01-03 MED ORDER — CYCLOBENZAPRINE HCL 10 MG PO TABS
5.0000 mg | ORAL_TABLET | Freq: Once | ORAL | Status: AC
  Administered 2020-01-03: 5 mg via ORAL
  Filled 2020-01-03: qty 1

## 2020-01-03 MED ORDER — HYDROXYZINE HCL 25 MG PO TABS
50.0000 mg | ORAL_TABLET | Freq: Once | ORAL | Status: AC
  Administered 2020-01-03: 50 mg via ORAL
  Filled 2020-01-03: qty 2

## 2020-01-03 NOTE — Final Progress Note (Signed)
Physician Final Progress Note  Patient ID: Samuel Molina MRN: [id number] DOB/AGE: May 31, 1960 59 y.o.  Admit date: 01/01/2020 Admitting provider: No admitting provider for patient encounter. Discharge date: 01/03/2020   Admission Diagnoses:  Adjustment disorder  Chronic pain  Narcissistic personality disorder  Cocaine dependence   Discharge Diagnoses:  Active Problems:   * No active hospital problems. * Same   Consults:  TTS / ER MD / Psych MD  Significant Findings/ Diagnostic Studies: none   Procedures:  MD Rounds   Discharge Condition: {faiir    Disposition:  home or SW consult in case of homelessness     Diet: { as tolerated   Discharge Activity: { as tolerated    Follow-up Information    Schedule an appointment as soon as possible for a visit  with [place].   Why: For follow-up of your L hip pain Contact information: [place] [address] [phone]              Patient has a diffcult character disorder he repeats the same pattern of not speaking due to oppositional behavior rather than true SI and or HI.   I have rescinded his IVC as there is no acute psych problem as before  Patient is referred to general SW in case he is homeless or needs assistance  Psych cleared however    Patient is vague as to his living situation and gets angry at anyone who tries to intervene   After General SW attempt   And he fails to give info and or needs   He could be escorted home or away from hospital via police if he refuses to leave    MS is limited ---  He is oriented times four His consciousness is not clouded or fluctuant His concentration and attention are normal He is deliberately withholding and does not speak  His mood ---is baseline angry irritable Affect is about the same he is deliberately withholding   Judgement insight reliability are all poor No active SI HI or plans  Memory intact through general  interference and knowing him  Psychosis and mania not of issue here Abstraction poor Disgruntled and belligerent  Speech somewhat loud and harsh    No other labs ---    Total time spent taking care of this patient: 20 - 30 minutes   Signed: Eulas Post 01/03/2020, 11:23 AM

## 2020-01-03 NOTE — ED Notes (Signed)
ER Attending at bedside. Po meds ordered for pain and acid reflux.

## 2020-01-03 NOTE — ED Notes (Signed)
Hourly rounding reveals patient sleeping in room. No complaints, stable, in no acute distress. Q15 minute rounds and monitoring via Security Cameras to continue. 

## 2020-01-03 NOTE — ED Notes (Signed)
Hourly rounding reveals patient awake in room. No complaints, stable, in no acute distress. Q15 minute rounds and monitoring via Security Cameras to continue. 

## 2020-01-03 NOTE — ED Provider Notes (Signed)
Emergency Medicine Observation Re-evaluation Note  Samuel Molina is a 59 y.o. male, seen on rounds today.  Pt initially presented to the ED for complaints of Hip Pain Currently, the patient is resting comfortably.  Physical Exam  BP 133/83 (BP Location: Right Arm)   Pulse (!) 58   Temp 98.4 F (36.9 C) (Oral)   Resp 16   Ht 6\' 1"  (1.854 m)   Wt 97.5 kg   SpO2 100%   BMI 28.37 kg/m  Physical Exam Vitals and nursing note reviewed.  HENT:     Head: Normocephalic and atraumatic.     Right Ear: External ear normal.     Left Ear: External ear normal.     Nose: Nose normal.  Cardiovascular:     Rate and Rhythm: Normal rate.  Pulmonary:     Effort: No respiratory distress.  Abdominal:     General: There is no distension.  Neurological:     Mental Status: He is alert.    TTP over L hip and L lower back. Similar to area documented in prior notes.   ED Course / MDM  EKG:    I have reviewed the labs performed to date as well as medications administered while in observation.  Recent changes in the last 24 hours include none.   Stills complains in some pain in his Left lower back, left hip, and L upper leg. Will try 5mg  of flexeril given some muscular TTP on exam and possible spasm contributing to pain.   Plan  Current plan is for psych to re-assess. Patient is under full IVC at this time.   Lucrezia Starch, MD 01/03/20 [phone]

## 2020-01-03 NOTE — ED Notes (Signed)
Pt given breakfast; no other needs voiced at this time. Will continue to monitor Q15 minute rounds.

## 2020-01-03 NOTE — Care Management (Signed)
TOC team attempted to see patient for DCP- substance abuse counseling and resource for housing. Pt has already left the ED.

## 2020-01-03 NOTE — ED Notes (Signed)
Patient discharged to home

## 2020-01-04 ENCOUNTER — Emergency Department
Admission: EM | Admit: 2020-01-04 | Discharge: 2020-01-04 | Disposition: A | Discharge status: Home / Self Care | Payer: Medicaid Other | Attending: Emergency Medicine | Admitting: Emergency Medicine

## 2020-01-04 ENCOUNTER — Other Ambulatory Visit: Payer: Self-pay

## 2020-01-04 DIAGNOSIS — M7062 Trochanteric bursitis, left hip: Secondary | ICD-10-CM

## 2020-01-04 DIAGNOSIS — F17210 Nicotine dependence, cigarettes, uncomplicated: Secondary | ICD-10-CM | POA: Insufficient documentation

## 2020-01-04 DIAGNOSIS — Y939 Activity, unspecified: Secondary | ICD-10-CM | POA: Insufficient documentation

## 2020-01-04 MED ORDER — CYCLOBENZAPRINE HCL 5 MG PO TABS: 5.0000 mg | ORAL_TABLET | Freq: Three times a day (TID) | ORAL | 0 refills | Status: DC | PRN

## 2020-01-04 MED ORDER — KETOROLAC TROMETHAMINE 10 MG PO TABS: 10.0000 mg | ORAL_TABLET | Freq: Three times a day (TID) | ORAL | 0 refills | Status: DC

## 2020-01-04 MED ORDER — ORPHENADRINE CITRATE 30 MG/ML IJ SOLN
60.0000 mg | INTRAMUSCULAR | Status: AC
  Administered 2020-01-04: 60 mg via INTRAMUSCULAR
  Filled 2020-01-04: qty 2

## 2020-01-04 MED ORDER — KETOROLAC TROMETHAMINE 30 MG/ML IJ SOLN
30.0000 mg | Freq: Once | INTRAMUSCULAR | Status: AC
  Administered 2020-01-04: 30 mg via INTRAMUSCULAR
  Filled 2020-01-04: qty 1

## 2020-01-04 NOTE — ED Triage Notes (Signed)
Pt to the er for left hip pain. Pt states he was here the other day but nobody did anything. Pt is angry about Dr Owens Shark. Pt is all over the place.

## 2020-01-04 NOTE — ED Notes (Signed)
Pt may eat and drink per EDP. Pt given lemon icee per request.

## 2020-01-04 NOTE — ED Provider Notes (Signed)
[facility] Emergency Department Provider Note ____________________________________________  Time seen: 1646  I have reviewed the triage vital signs and the nursing notes.  HISTORY  Chief Complaint  Hip Pain  HPI Samuel Molina is a 59 y.o. male returns to the ED for ongoing left hip pain.  Patient was initially evaluated about 3 days prior, when he described a contusion to the left knee.  He presented at that time with additional complaints of homicidal ideation.  Patient was voluntarily committed  for psych evaluation at that time.  Initial x-ray was negative for any acute fracture or dislocation to the hip.  Patient was discharged with instructions to follow-up.  He presents today denies any interim injury, but still noting pain to the lateral left hip.  Patient denies any distal paresthesias, catch, click, lock, or give way.  He also denies any history of chronic ongoing hip problems.  Past Medical History:  Diagnosis Date  . GERD (gastroesophageal reflux disease)   . Obesity     Patient Active Problem List   Diagnosis Date Noted  . Gastroesophageal reflux disease with esophagitis   . Morbid obesity ([place]) 09/19/2017    Past Surgical History:  Procedure Laterality Date  . ESOPHAGOGASTRODUODENOSCOPY (EGD) WITH PROPOFOL N/A 09/20/2017   Procedure: ESOPHAGOGASTRODUODENOSCOPY (EGD) WITH PROPOFOL;  Surgeon: Lin Landsman, MD;  Location: [place];  Service: Gastroenterology;  Laterality: N/A;  . KNEE SURGERY Right     Prior to Admission medications   Medication Sig Start Date End Date Taking? Authorizing Provider  cyclobenzaprine (FLEXERIL) 5 MG tablet Take 1 tablet (5 mg total) by mouth 3 (three) times daily as needed. 01/04/20   Kamariyah Timberlake, Dannielle Karvonen, PA-C  ketorolac (TORADOL) 10 MG tablet Take 1 tablet (10 mg total) by mouth every 8 (eight) hours. 01/04/20   Taleeyah Bora, Dannielle Karvonen, PA-C  omeprazole (PRILOSEC OTC) 20 MG tablet Take 2 tablets  (40 mg total) by mouth 2 (two) times daily before a meal. 09/19/17 01/29/19  Lin Landsman, MD    Allergies Acetaminophen  History reviewed. No pertinent family history.  Social History Social History   Tobacco Use  . Smoking status: Current Every Day Smoker    Packs/day: 0.50    Types: Cigarettes  . Smokeless tobacco: Never Used  Vaping Use  . Vaping Use: Never used  Substance Use Topics  . Alcohol use: Not Currently  . Drug use: Never    Review of Systems  Constitutional: Negative for fever. Cardiovascular: Negative for chest pain. Respiratory: Negative for shortness of breath. Genitourinary: Negative for dysuria. Musculoskeletal: Negative for back pain.  Left hip pain as above. Skin: Negative for rash. Neurological: Negative for headaches, focal weakness or numbness. ____________________________________________  PHYSICAL EXAM:  VITAL SIGNS: ED Triage Vitals [01/04/20 1550]  Enc Vitals Group     BP 140/83     Pulse Rate (!) 57     Resp 18     Temp 98.2 F (36.8 C)     Temp Source Oral     SpO2 99 %     Weight 215 lb (97.5 kg)     Height 6\' 1"  (1.854 m)     Head Circumference      Peak Flow      Pain Score 10     Pain Loc      Pain Edu?      Excl. in [place]?     Constitutional: Alert and oriented. Well appearing and in no distress. Head:  Normocephalic and atraumatic. Eyes: Conjunctivae are normal. Normal extraocular movements Cardiovascular: Normal rate, regular rhythm. Normal distal pulses. Respiratory: Normal respiratory effort. No wheezes/rales/rhonchi. Musculoskeletal: Patient with tenderness to palpation to the lateral left hip at the trochanter.  Is able demonstrate normal hip flexion extension range.  Nontender with normal range of motion in all extremities.  Neurologic: Cranial nerves II through XII grossly intact.  Normal LE DTRs bilaterally.  Normal toe dorsiflexion foot eversion on exam.  Negative supine straight leg raise.  Normal gross  sensation. Normal speech and language. No gross focal neurologic deficits are appreciated. Skin:  Skin is warm, dry and intact. No rash noted. Psychiatric: Mood and affect are normal. Patient exhibits appropriate insight and judgment. ____________________________________________  PROCEDURES  Toradol 30 mg IM Norflex 60 mg IM  Procedures ____________________________________________  INITIAL IMPRESSION / ASSESSMENT AND PLAN / ED COURSE  Patient with ED evaluation and subsequent visit for left hip pain.  Patient initially describes a contusion to the hip.  He was evaluated 3 days ago with a negative x-rays of the hip.  Patient's presentation is more consistent with a hip bursitis given his recent contusion.  He was treated empirically with IM anti-inflammatories and muscle relaxants.  Patient reports improvement of his symptoms and is ready to discharge at this time.  Prescriptions for Flexeril and Toradol are provided for his benefit.  He is referred to orthopedics for ongoing management and evaluation of his trochanteric bursitis.  Kenan Moodie was evaluated in Emergency Department on 01/04/2020 for the symptoms described in the history of present illness. He was evaluated in the context of the global COVID-19 pandemic, which necessitated consideration that the patient might be at risk for infection with the SARS-CoV-2 virus that causes COVID-19. Institutional protocols and algorithms that pertain to the evaluation of patients at risk for COVID-19 are in a state of rapid change based on information released by regulatory bodies including the CDC and federal and state organizations. These policies and algorithms were followed during the patient's care in the ED. ____________________________________________  FINAL CLINICAL IMPRESSION(S) / ED DIAGNOSES  Final diagnoses:  Trochanteric bursitis of left hip      Tzvi Economou, Dannielle Karvonen, PA-C 01/04/20 2229    Duffy Bruce, MD 01/09/20  [phone]

## 2020-01-04 NOTE — Discharge Instructions (Addendum)
Your exam and previous XR are consistent with a likely hip contusion and resulting bursitis. Take the prescription meds as directed. Apply ice packs to reduce symptoms. Follow-up with Ortho for continued symptoms.

## 2020-01-04 NOTE — ED Notes (Signed)
Pt able to move to wheel chair and walk from wheel chair to bathroom. Pt wheeled to lobby

## 2020-01-20 ENCOUNTER — Inpatient Hospital Stay
Admission: AD | Admit: 2020-01-20 | Discharge: 2020-01-23 | DRG: 885 | Disposition: A | Discharge status: Home / Self Care | Payer: No Typology Code available for payment source | Source: Intra-hospital | Attending: Psychiatry | Admitting: Psychiatry

## 2020-01-20 ENCOUNTER — Other Ambulatory Visit: Payer: Self-pay

## 2020-01-20 ENCOUNTER — Encounter: Payer: Self-pay | Admitting: Emergency Medicine

## 2020-01-20 ENCOUNTER — Emergency Department: Payer: Medicaid Other

## 2020-01-20 ENCOUNTER — Encounter: Payer: Self-pay | Admitting: Internal Medicine

## 2020-01-20 ENCOUNTER — Emergency Department
Admission: EM | Admit: 2020-01-20 | Discharge: 2020-01-20 | Disposition: A | Discharge status: Other Acute Inpatient Hospital | Payer: Medicaid Other | Attending: Emergency Medicine | Admitting: Emergency Medicine

## 2020-01-20 DIAGNOSIS — R45851 Suicidal ideations: Secondary | ICD-10-CM | POA: Insufficient documentation

## 2020-01-20 DIAGNOSIS — M7062 Trochanteric bursitis, left hip: Secondary | ICD-10-CM | POA: Diagnosis present

## 2020-01-20 DIAGNOSIS — R451 Restlessness and agitation: Secondary | ICD-10-CM | POA: Diagnosis not present

## 2020-01-20 DIAGNOSIS — Z20822 Contact with and (suspected) exposure to covid-19: Secondary | ICD-10-CM | POA: Diagnosis present

## 2020-01-20 DIAGNOSIS — F419 Anxiety disorder, unspecified: Secondary | ICD-10-CM | POA: Diagnosis present

## 2020-01-20 DIAGNOSIS — E669 Obesity, unspecified: Secondary | ICD-10-CM | POA: Diagnosis present

## 2020-01-20 DIAGNOSIS — M25562 Pain in left knee: Secondary | ICD-10-CM | POA: Insufficient documentation

## 2020-01-20 DIAGNOSIS — G4700 Insomnia, unspecified: Secondary | ICD-10-CM | POA: Diagnosis present

## 2020-01-20 DIAGNOSIS — F17210 Nicotine dependence, cigarettes, uncomplicated: Secondary | ICD-10-CM | POA: Diagnosis present

## 2020-01-20 DIAGNOSIS — Z886 Allergy status to analgesic agent status: Secondary | ICD-10-CM

## 2020-01-20 DIAGNOSIS — M25559 Pain in unspecified hip: Secondary | ICD-10-CM

## 2020-01-20 DIAGNOSIS — Z6828 Body mass index (BMI) 28.0-28.9, adult: Secondary | ICD-10-CM

## 2020-01-20 DIAGNOSIS — K219 Gastro-esophageal reflux disease without esophagitis: Secondary | ICD-10-CM | POA: Diagnosis present

## 2020-01-20 DIAGNOSIS — F332 Major depressive disorder, recurrent severe without psychotic features: Secondary | ICD-10-CM | POA: Diagnosis present

## 2020-01-20 DIAGNOSIS — F329 Major depressive disorder, single episode, unspecified: Secondary | ICD-10-CM | POA: Insufficient documentation

## 2020-01-20 DIAGNOSIS — M25552 Pain in left hip: Secondary | ICD-10-CM

## 2020-01-20 DIAGNOSIS — F1410 Cocaine abuse, uncomplicated: Secondary | ICD-10-CM

## 2020-01-20 DIAGNOSIS — Z79899 Other long term (current) drug therapy: Secondary | ICD-10-CM | POA: Diagnosis not present

## 2020-01-20 DIAGNOSIS — Z716 Tobacco abuse counseling: Secondary | ICD-10-CM

## 2020-01-20 LAB — COMPREHENSIVE METABOLIC PANEL
ALT: 17 U/L (ref 0–44)
AST: 22 U/L (ref 15–41)
Albumin: 3.8 g/dL (ref 3.5–5.0)
Alkaline Phosphatase: 81 U/L (ref 38–126)
Anion gap: 6 (ref 5–15)
BUN: 14 mg/dL (ref 6–20)
CO2: 25 mmol/L (ref 22–32)
Calcium: 8.9 mg/dL (ref 8.9–10.3)
Chloride: 106 mmol/L (ref 98–111)
Creatinine, Ser: 0.92 mg/dL (ref 0.61–1.24)
GFR calc Af Amer: >60 mL/min (ref >60)
GFR calc non Af Amer: >60 mL/min (ref >60)
Glucose, Bld: 103 mg/dL — ABNORMAL HIGH (ref 70–99)
Potassium: 3.7 mmol/L (ref 3.5–5.1)
Sodium: 137 mmol/L (ref 135–145)
Total Bilirubin: 0.7 mg/dL (ref 0.3–1.2)
Total Protein: 6.8 g/dL (ref 6.5–8.1)

## 2020-01-20 LAB — URINE DRUG SCREEN, QUALITATIVE (ARMC ONLY)
Amphetamines, Ur Screen: NOT DETECTED
Barbiturates, Ur Screen: NOT DETECTED
Benzodiazepine, Ur Scrn: NOT DETECTED
Cannabinoid 50 Ng, Ur ~~LOC~~: NOT DETECTED
Cocaine Metabolite,Ur ~~LOC~~: POSITIVE — AB
MDMA (Ecstasy)Ur Screen: NOT DETECTED
Methadone Scn, Ur: NOT DETECTED
Opiate, Ur Screen: NOT DETECTED
Phencyclidine (PCP) Ur S: NOT DETECTED
Tricyclic, Ur Screen: NOT DETECTED

## 2020-01-20 LAB — CBC
HCT: 43.2 % (ref 39.0–52.0)
Hemoglobin: 14.2 g/dL (ref 13.0–17.0)
MCH: 26.8 pg (ref 26.0–34.0)
MCHC: 32.9 g/dL (ref 30.0–36.0)
MCV: 81.7 fL (ref 80.0–100.0)
Platelets: 287 10*3/uL (ref 150–400)
RBC: 5.29 MIL/uL (ref 4.22–5.81)
RDW: 14.7 % (ref 11.5–15.5)
WBC: 6.6 10*3/uL (ref 4.0–10.5)
nRBC: 0 % (ref 0.0–0.2)

## 2020-01-20 LAB — RESPIRATORY PANEL BY RT PCR (FLU A&B, COVID)
Influenza A by PCR: NEGATIVE
Influenza B by PCR: NEGATIVE
SARS Coronavirus 2 by RT PCR: NEGATIVE

## 2020-01-20 LAB — SALICYLATE LEVEL: Salicylate Lvl: <7 mg/dL — ABNORMAL LOW (ref 7.0–30.0)

## 2020-01-20 LAB — ACETAMINOPHEN LEVEL: Acetaminophen (Tylenol), Serum: <10 ug/mL — ABNORMAL LOW (ref 10–30)

## 2020-01-20 LAB — ETHANOL: Alcohol, Ethyl (B): <10 mg/dL (ref <10)

## 2020-01-20 MED ORDER — DULOXETINE HCL 30 MG PO CPEP
30.0000 mg | ORAL_CAPSULE | Freq: Every morning | ORAL | Status: DC
  Administered 2020-01-21: 30 mg via ORAL
  Filled 2020-01-20: qty 1

## 2020-01-20 MED ORDER — OLANZAPINE 5 MG PO TBDP
10.0000 mg | ORAL_TABLET | Freq: Every day | ORAL | Status: DC
  Administered 2020-01-20: 10 mg via ORAL
  Filled 2020-01-20: qty 2

## 2020-01-20 MED ORDER — TRAMADOL HCL 50 MG PO TABS
50.0000 mg | ORAL_TABLET | Freq: Four times a day (QID) | ORAL | Status: DC | PRN
  Administered 2020-01-20 – 2020-01-21 (×2): 50 mg via ORAL
  Filled 2020-01-20 (×2): qty 1

## 2020-01-20 MED ORDER — ALUM & MAG HYDROXIDE-SIMETH 200-200-20 MG/5ML PO SUSP: 30.0000 mL | ORAL | Status: DC | PRN

## 2020-01-20 MED ORDER — KETOROLAC TROMETHAMINE 60 MG/2ML IM SOLN
15.0000 mg | Freq: Once | INTRAMUSCULAR | Status: AC
  Administered 2020-01-20: 15 mg via INTRAMUSCULAR
  Filled 2020-01-20: qty 2

## 2020-01-20 MED ORDER — MAGNESIUM HYDROXIDE 400 MG/5ML PO SUSP: 30.0000 mL | Freq: Every day | ORAL | Status: DC | PRN

## 2020-01-20 MED ORDER — LIDOCAINE 5 % EX PTCH
1.0000 | MEDICATED_PATCH | CUTANEOUS | Status: DC
  Administered 2020-01-20: 1 via TRANSDERMAL
  Filled 2020-01-20: qty 1

## 2020-01-20 MED ORDER — ACETAMINOPHEN 325 MG PO TABS: 650.0000 mg | ORAL_TABLET | Freq: Four times a day (QID) | ORAL | Status: DC | PRN

## 2020-01-20 NOTE — ED Notes (Signed)
Gave lunch tray with juice.

## 2020-01-20 NOTE — Plan of Care (Signed)
Patient new to the unit today, hasn't had time to progress  Problem: Education: Goal: Knowledge of [facility] Education information/materials will improve Outcome: Not Progressing Goal: Emotional status will improve Outcome: Not Progressing Goal: Mental status will improve Outcome: Not Progressing Goal: Verbalization of understanding the information provided will improve Outcome: Not Progressing   Problem: Safety: Goal: Periods of time without injury will increase Outcome: Not Progressing   Problem: Education: Goal: Utilization of techniques to improve thought processes will improve Outcome: Not Progressing Goal: Knowledge of the prescribed therapeutic regimen will improve Outcome: Not Progressing   Problem: Safety: Goal: Ability to disclose and discuss suicidal ideas will improve Outcome: Not Progressing Goal: Ability to identify and utilize support systems that promote safety will improve Outcome: Not Progressing

## 2020-01-20 NOTE — ED Provider Notes (Addendum)
[facility] Emergency Department Provider Note  ____________________________________________  Time seen: Approximately 10:24 AM  I have reviewed the triage vital signs and the nursing notes.   HISTORY  Chief Complaint Hip Pain, Suicidal, and Homicidal    HPI Xsavier Seeley is a 59 y.o. male with a history of GERD and obesity who comes to the ED complaining of left hip pain ongoing for the past 3 weeks, constant, worse with walking, no alleviating factors, radiates from the left hip down to the left knee and is associated with some paresthesia of the left lateral thigh and shin which has appeared over the last few days.  Denies any new falls but symptoms started several weeks ago after he had a fall onto the left hip.    He was seen in the ED around that time, had x-rays which were negative and diagnosed with trochanteric bursitis.  He has taken anti-inflammatory medicines which did help, but is frustrated symptoms persist.  He has not followed up with orthopedics quite yet, has an appointment coming up on October 7.   Denies any new falls or injuries, no fever, no back pain.  No headache vision changes or loss of power, no left upper extremity symptoms.   Past Medical History:  Diagnosis Date  . GERD (gastroesophageal reflux disease)   . Obesity      Patient Active Problem List   Diagnosis Date Noted  . Gastroesophageal reflux disease with esophagitis   . Morbid obesity ([place]) 09/19/2017     Past Surgical History:  Procedure Laterality Date  . ESOPHAGOGASTRODUODENOSCOPY (EGD) WITH PROPOFOL N/A 09/20/2017   Procedure: ESOPHAGOGASTRODUODENOSCOPY (EGD) WITH PROPOFOL;  Surgeon: Lin Landsman, MD;  Location: [place];  Service: Gastroenterology;  Laterality: N/A;  . KNEE SURGERY Right      Prior to Admission medications   Medication Sig Start Date End Date Taking? Authorizing Provider  cyclobenzaprine (FLEXERIL) 5 MG tablet Take 1 tablet  (5 mg total) by mouth 3 (three) times daily as needed. 01/04/20   Menshew, Dannielle Karvonen, PA-C  ketorolac (TORADOL) 10 MG tablet Take 1 tablet (10 mg total) by mouth every 8 (eight) hours. 01/04/20   Menshew, Dannielle Karvonen, PA-C  omeprazole (PRILOSEC OTC) 20 MG tablet Take 2 tablets (40 mg total) by mouth 2 (two) times daily before a meal. 09/19/17 01/29/19  Lin Landsman, MD     Allergies Acetaminophen   No family history on file.  Social History Social History   Tobacco Use  . Smoking status: Current Every Day Smoker    Packs/day: 0.50    Types: Cigarettes  . Smokeless tobacco: Never Used  Vaping Use  . Vaping Use: Never used  Substance Use Topics  . Alcohol use: Not Currently  . Drug use: Never    Review of Systems  Constitutional:   No fever or chills.  ENT:   No sore throat. No rhinorrhea. Cardiovascular:   No chest pain or syncope. Respiratory:   No dyspnea or cough. Gastrointestinal:   Negative for abdominal pain, vomiting and diarrhea.  Musculoskeletal: Left hip pain as above All other systems reviewed and are negative except as documented above in ROS and HPI.  ____________________________________________   PHYSICAL EXAM:  VITAL SIGNS: ED Triage Vitals  Enc Vitals Group     BP 01/20/20 1002 127/68     Pulse Rate 01/20/20 1002 (!) 53     Resp 01/20/20 1002 18     Temp 01/20/20 1002 97.7 F (  36.5 C)     Temp Source 01/20/20 1002 Oral     SpO2 01/20/20 1002 98 %     Weight 01/20/20 1022 215 lb (97.5 kg)     Height 01/20/20 1022 6\' 1"  (1.854 m)     Head Circumference --      Peak Flow --      Pain Score 01/20/20 1001 10     Pain Loc --      Pain Edu? --      Excl. in [place]? --     Vital signs reviewed, nursing assessments reviewed.   Constitutional:   Alert and oriented. Non-toxic appearance. Eyes:   Conjunctivae are normal. EOMI. ENT      Head:   Normocephalic and atraumatic.  Respiratory: Unlabored breathing  Musculoskeletal:   Normal  range of motion in all extremities.  No edema.  Has tenderness over the left greater trochanter.  Tolerates passive range of motion of the left hip.  Long bones are stable without other bony point tenderness. Neurologic:   Normal speech and language.  Motor grossly intact.  EHL intact No acute focal neurologic deficits are appreciated.   ____________________________________________    LABS (pertinent positives/negatives) (all labs ordered are listed, but only abnormal results are displayed) Labs Reviewed  COMPREHENSIVE METABOLIC PANEL - Abnormal; Notable for the following components:      Result Value   Glucose, Bld 103 (*)    All other components within normal limits  SALICYLATE LEVEL - Abnormal; Notable for the following components:   Salicylate Lvl <5.6 (*)    All other components within normal limits  ACETAMINOPHEN LEVEL - Abnormal; Notable for the following components:   Acetaminophen (Tylenol), Serum <10 (*)    All other components within normal limits  ETHANOL  CBC  URINE DRUG SCREEN, QUALITATIVE ([facility] ONLY)   ____________________________________________   EKG  ____________________________________________    RADIOLOGY  DG Lumbar Spine 2-3 Views  Result Date: 01/20/2020 CLINICAL DATA:  Left hip pain radiating to left knee. EXAM: LUMBAR SPINE - 2-3 VIEW COMPARISON:  Abdomen 09/04/2017. FINDINGS: Mild scoliosis concave left. Diffuse multilevel degenerative change. No acute bony abnormality. No evidence of fracture. Pelvic calcifications consistent phleboliths. IMPRESSION: Mild scoliosis concave left. Diffuse multilevel degenerative change. No acute bony abnormality. Electronically Signed   By: Marcello Moores  Register   On: 01/20/2020 10:54   DG Hip Unilat W or Wo Pelvis 2-3 Views Left  Result Date: 01/20/2020 CLINICAL DATA:  Left hip pain. EXAM: DG HIP (WITH OR WITHOUT PELVIS) 2-3V LEFT COMPARISON:  01/01/2020. FINDINGS: Degenerative changes lumbar spine and both hips. No acute  bony or joint abnormality. No evidence of fracture or dislocation. Sclerotic changes noted the right femoral head. Avascular necrosis cannot be excluded. Pelvic calcifications consistent phleboliths. IMPRESSION: 1. Degenerative changes lumbar spine and both hips. No acute abnormality identified. 2. Sclerotic changes noted the right femoral head. Avascular necrosis cannot be excluded. Electronically Signed   By: Marcello Moores  Register   On: 01/20/2020 10:53    ____________________________________________   PROCEDURES Procedures  ____________________________________________  CLINICAL IMPRESSION / ASSESSMENT AND PLAN / ED COURSE  Pertinent labs & imaging results that were available during my care of the patient were reviewed by me and considered in my medical decision making (see chart for details).  Lofton Leon was evaluated in Emergency Department on 01/20/2020 for the symptoms described in the history of present illness. He was evaluated in the context of the global COVID-19 pandemic, which necessitated consideration that the  patient might be at risk for infection with the SARS-CoV-2 virus that causes COVID-19. Institutional protocols and algorithms that pertain to the evaluation of patients at risk for COVID-19 are in a state of rapid change based on information released by regulatory bodies including the CDC and federal and state organizations. These policies and algorithms were followed during the patient's care in the ED.   Patient presents with subacute left hip pain without any new injuries or other worrisome changes.  Strength is intact.  Will repeat x-rays of hip and lumbar spine.  Intramuscular Toradol for pain relief.  Doubt septic arthritis, necrotizing fasciitis, abscess, central cord syndrome.  Most likely needs continued pain management until he can follow-up with orthopedics.  Patient had voiced some suicidal thoughts in triage.  After addressing his main complaint of hip pain, the  patient reports feeling more calm and notes that he was speaking out of frustration, and does not seriously have any thoughts about harming himself or others, has no plans or intention.  Reviewed electronic medical record, he does have multiple evaluations by psychiatry, but at present time is not committable.   ----------------------------------------- 12:19 PM on 01/20/2020 -----------------------------------------  Labs unremarkable.  X-ray of the hip pelvis and lumbar spine unremarkable with some chronic degenerative changes.  Would continue NSAIDs and orthopedic follow-up plan.  Awaiting psychiatry consult for his agitation and mention of suicidal thoughts.  The patient has been placed in psychiatric observation due to the need to provide a safe environment for the patient while obtaining psychiatric consultation and evaluation, as well as ongoing medical and medication management to treat the patient's condition.  The patient has not been placed under full IVC at this time.      ____________________________________________   FINAL CLINICAL IMPRESSION(S) / ED DIAGNOSES    Final diagnoses:  Left hip pain     ED Discharge Orders    None      Portions of this note were generated with dragon dictation software. Dictation errors may occur despite best attempts at proofreading.   Carrie Mew, MD 01/20/20 [place]    Carrie Mew, MD 01/20/20 1221

## 2020-01-20 NOTE — ED Notes (Signed)
Dr. Joni Fears in triage seeing pt. When this RN was trying to explain process to pt, he became very argumentative with RN. RN tried numerous times to explain to pt that his hip pain would be addressed but due to protocols, he has to be taken to psych area. Pt continued to be argumentative with RN.

## 2020-01-20 NOTE — ED Notes (Signed)
Pt received dinner tray at the end of bed as he continues to sleep at this time.  lw edt

## 2020-01-20 NOTE — BH Assessment (Signed)
Assessment Note Samuel Molina is an 59 y.o. male who presents to [facility] ED voluntarily for treatment. Per triage note, Pt to ED via POV, pt states that he is being seen for left hip pina. Pt states that he also wishes to speak with psychiatry. Pt reports thought of harming himself and others. Pt states that he has been seen in the past for thoughts of harming others. Pt denies plan as to how he would harm himself. Pt is cooperative at this time and in NAD.  During TTS assessment pt presents alert, depressed, and oriented x 3, irritable, argumentative, uncooperative and mood-congruent with affect. The pt does not appear to be responding to internal or external stimuli. Neither is the patient presenting with any delusional thinking. Pt verified the information provided to triage RN. Pt identified his main complaint to be depression due to many stressors. Pt vaguely identified stressors with family and friends but is currently unwilling to elaborate at this time. Pt reports to be unaware of what he needs stating "I don't know" to the majority of follow up questions. Pt grew irritable and stated "I get really tired of people asking me the same questions". Pt reports to endorse SI towards "anybody that get on his nerves". Pt denies any current plan to hurt anyone specifically and was unclear about his current access to weapons. Pt denies any struggles eating or sleeping.  Pt inquired about INPT but was unable to remain calm to receive the information. Pt abruptly states "I guess I will be interested in staying for treatment I just can't be here long I got to get back to my home and job". Due to pt increased irritability and argumentative behaviors, TTS stopped assessment. Pt denies any current SA/SI/AH/VH and contracted for safety stating "of course I can keep myself safe".   Per Dr. Janese Banks pt is meets criteria for INPT  Diagnosis: Depression   Past Medical History:  Past Medical History:  Diagnosis Date  .  GERD (gastroesophageal reflux disease)   . Obesity     Past Surgical History:  Procedure Laterality Date  . ESOPHAGOGASTRODUODENOSCOPY (EGD) WITH PROPOFOL N/A 09/20/2017   Procedure: ESOPHAGOGASTRODUODENOSCOPY (EGD) WITH PROPOFOL;  Surgeon: Lin Landsman, MD;  Location: [place];  Service: Gastroenterology;  Laterality: N/A;  . KNEE SURGERY Right     Family History: No family history on file.  Social History:  reports that he has been smoking cigarettes. He has been smoking about 0.50 packs per day. He has never used smokeless tobacco. He reports previous alcohol use. He reports that he does not use drugs.  Additional Social History:  Alcohol / Drug Use Pain Medications: see mar Prescriptions: see mar Over the Counter: see mar History of alcohol / drug use?:  (Pt reports none)  CIWA: CIWA-Ar BP: 127/68 Pulse Rate: (!) 53 COWS:    Allergies:  Allergies  Allergen Reactions  . Acetaminophen Rash, Itching and Nausea And Vomiting    Home Medications: (Not in a hospital admission)   OB/GYN Status:  No LMP for male patient.  General Assessment Data Location of Assessment: [facility] ED TTS Assessment: In system Is this a Tele or Face-to-Face Assessment?: Face-to-Face Is this an Initial Assessment or a Re-assessment for this encounter?: Initial Assessment Patient Accompanied by:: N/A Language Other than English: No Living Arrangements: Other (Comment) What gender do you identify as?: Male Date Telepsych consult ordered in [facility]: 01/20/20 Time Telepsych consult ordered in [facility]: [place] Marital status: Single Maiden name: n/a Pregnancy Status:  No Living Arrangements: Alone Can pt return to current living arrangement?: Yes Admission Status: Voluntary Is patient capable of signing voluntary admission?: Yes Referral Source: Self/Family/Friend Insurance type: None      Crisis Care Plan Living Arrangements: Alone Legal Guardian:  (self) Name of Psychiatrist: None Name  of Therapist: None  Education Status Is patient currently in school?: No Is the patient employed, unemployed or receiving disability?: Employed (self-employed)  Risk to self with the past 6 months Suicidal Ideation: No Has patient been a risk to self within the past 6 months prior to admission? : No Suicidal Intent: No Has patient had any suicidal intent within the past 6 months prior to admission? : No Is patient at risk for suicide?: No Suicidal Plan?: No Has patient had any suicidal plan within the past 6 months prior to admission? : No Access to Means: No What has been your use of drugs/alcohol within the last 12 months?: Per last filed "Controlled Substance" Previous Attempts/Gestures: No How many times?: 0 Other Self Harm Risks: None reported  Triggers for Past Attempts: None known Intentional Self Injurious Behavior: None Family Suicide History: No Recent stressful life event(s): Conflict (Comment) (family, friends ) Persecutory voices/beliefs?: No Depression: Yes Depression Symptoms: Feeling angry/irritable Substance abuse history and/or treatment for substance abuse?: No Suicide prevention information given to non-admitted patients: Not applicable  Risk to Others within the past 6 months Homicidal Ideation: Yes-Currently Present Does patient have any lifetime risk of violence toward others beyond the six months prior to admission? : Unknown Thoughts of Harm to Others: Yes-Currently Present Comment - Thoughts of Harm to Others: "Anybody getting on my nerves" Current Homicidal Intent: No Current Homicidal Plan: No Access to Homicidal Means:  (unknown) Describe Access to Homicidal Means: Pt reports none  Identified Victim: None  History of harm to others?: No Assessment of Violence: None Noted Violent Behavior Description: None  Does patient have access to weapons?:  (unknown ) Criminal Charges Pending?: No Does patient have a court date: No Is patient on  probation?: No  Psychosis Hallucinations: None noted Delusions: None noted  Mental Status Report Appearance/Hygiene: In scrubs Eye Contact: Fair Motor Activity: Freedom of movement Speech: Logical/coherent, Aggressive Level of Consciousness: Alert Mood: Depressed, Irritable Affect: Depressed, Irritable Anxiety Level: Minimal Thought Processes: Coherent Judgement: Unimpaired Orientation: Appropriate for developmental age Obsessive Compulsive Thoughts/Behaviors: None  Cognitive Functioning Concentration: Normal Memory: Recent Intact, Remote Intact Is patient IDD: No Insight: Fair Impulse Control: Poor Appetite: Good Have you had any weight changes? : No Change Sleep: No Change Total Hours of Sleep:  (Pt reports to be sleeping "fine") Vegetative Symptoms: None  ADLScreening [facility] Assessment Services) Patient's cognitive ability adequate to safely complete daily activities?: Yes Patient able to express need for assistance with ADLs?: Yes Independently performs ADLs?: Yes (appropriate for developmental age)  Prior Inpatient Therapy Prior Inpatient Therapy: No  Prior Outpatient Therapy Prior Outpatient Therapy: No Does patient have an ACCT team?: No Does patient have Intensive In-House Services?  : No Does patient have [facility] services? : Unknown Does patient have P4CC services?: Unknown  ADL Screening (condition at time of admission) Patient's cognitive ability adequate to safely complete daily activities?: Yes Is the patient deaf or have difficulty hearing?: No Does the patient have difficulty seeing, even when wearing glasses/contacts?: No Does the patient have difficulty concentrating, remembering, or making decisions?: No Patient able to express need for assistance with ADLs?: Yes Does the patient have difficulty dressing or bathing?: No Independently performs  ADLs?: Yes (appropriate for developmental age) Does the patient have difficulty walking or climbing  stairs?: No Weakness of Legs: None Weakness of Arms/Hands: None  Home Assistive Devices/Equipment Home Assistive Devices/Equipment: None  Therapy Consults (therapy consults require a physician order) PT Evaluation Needed: No OT Evalulation Needed: No SLP Evaluation Needed: No Abuse/Neglect Assessment (Assessment to be complete while patient is alone) Abuse/Neglect Assessment Can Be Completed: Yes Physical Abuse: Denies Verbal Abuse: Denies Sexual Abuse: Denies Exploitation of patient/patient's resources: Denies Self-Neglect: Denies Values / Beliefs Cultural Requests During Hospitalization: None Spiritual Requests During Hospitalization: None Consults Spiritual Care Consult Needed: No Transition of Care Team Consult Needed: No Advance Directives (For Healthcare) Does Patient Have a Medical Advance Directive?: No Would patient like information on creating a medical advance directive?: No - Patient declined          Disposition:  Disposition Initial Assessment Completed for this Encounter: Yes Patient referred to: Other (Comment)  On Site Evaluation by:   Reviewed with Physician:    Shanon Ace 01/20/2020 2:56 PM

## 2020-01-20 NOTE — ED Notes (Signed)
Called to ll bmu due to not hearing back from [facility] - spoke with him and the pt may move now

## 2020-01-20 NOTE — Progress Notes (Signed)
Patient admitted from [facility] - ED. Patient irritable during assessment. Denies SI but endorses HI, stating, "People annoy me and I want to hurt them." Patient became very irritable with staff when he found out there wasn't a TV in his room. Patient given education but remained irritable with staff. Patient sitting in a chair across from the medication room stating, "I can't stay here if there isn't a TV in my room." Patient given education, support, and encouragement to be active in his treatment plan. Patient endorses depression and anxiety with this Probation officer. Patient being monitored Q 15 minutes for safety per unit protocol. Patient remains safe on the unit.

## 2020-01-20 NOTE — Consult Note (Signed)
[facility] Face-to-Face Psychiatry Consult   Reason for Consult:  Feels depressed possibly suicidal ongoing cocaine intoxications and withdrawals    Voluntary status   Referring Physician:  ED MD  Patient Identification: Samuel Molina MRN:  [id number] Principal Diagnosis: <principal problem not specified> Diagnosis:  Active Problems:   * No active hospital problems. *  Major depression moderate recurrent Cocaine dependence Narcissistic personality disorder   Chronic left hip pain -----unclear origin  Unclear social issues as well as he generally remains vague not cooperative and is chronically oppositional, argumentative and defiant and remains vague for days in the ER ---generally never gives straight answer   Total Time spent with patient: 30 min or so    Subjective:   Samuel Molina is a 59 y.o. male patient transferred back to Psychiatry.  Known to Psych this is his third ED visit.  He has a pattern --of remaining in [place] without answering questions, asks to be left alone and does not cooperate.  Eventually we have to discharge him with security   HPI:   Same presentation and is cocaine positive.  He is vague with his needs but says he feels overwhelmed with various  Home pressures and people always bothering him He is vague with his safety margin but is voluntary status  He alludes to major depression but then goes back into an argumentative impossible double bind where neither SW nor MD can get better history     Past Psychiatric History:  Very vague  Recently here for similar issues two times recentlyh     Risk to Self:  not clear he is too vague  Risk to Others:  says he gets angry and agitated at others  Prior Inpatient Therapy:  none noted  Prior Outpatient Therapy:  none   Past Medical History:   History of Migraines    Past Medical History:  Diagnosis Date  . GERD (gastroesophageal reflux disease)   . Obesity     Past Surgical History:  Procedure  Laterality Date  . ESOPHAGOGASTRODUODENOSCOPY (EGD) WITH PROPOFOL N/A 09/20/2017   Procedure: ESOPHAGOGASTRODUODENOSCOPY (EGD) WITH PROPOFOL;  Surgeon: Lin Landsman, MD;  Location: [facility];  Service: Gastroenterology;  Laterality: N/A;  . KNEE SURGERY Right    Family History: No family history on file. Family Psychiatric  History:  He does not cooperate to answer   Social History:  Social History   Substance and Sexual Activity  Alcohol Use Not Currently     Social History   Substance and Sexual Activity  Drug Use Never    Social History   Socioeconomic History  . Marital status: Single    Spouse name: Not on file  . Number of children: Not on file  . Years of education: Not on file  . Highest education level: Not on file  Occupational History  . Not on file  Tobacco Use  . Smoking status: Current Every Day Smoker    Packs/day: 0.50    Types: Cigarettes  . Smokeless tobacco: Never Used  Vaping Use  . Vaping Use: Never used  Substance and Sexual Activity  . Alcohol use: Not Currently  . Drug use: Never  . Sexual activity: Not on file  Other Topics Concern  . Not on file  Social History Narrative  . Not on file   Social Determinants of Health   Financial Resource Strain:   . Difficulty of Paying Living Expenses: Not on file  Food Insecurity:   . Worried About Estate manager/land agent  of Food in the Last Year: Not on file  . Ran Out of Food in the Last Year: Not on file  Transportation Needs:   . Lack of Transportation (Medical): Not on file  . Lack of Transportation (Non-Medical): Not on file  Physical Activity:   . Days of Exercise per Week: Not on file  . Minutes of Exercise per Session: Not on file  Stress:   . Feeling of Stress : Not on file  Social Connections:   . Frequency of Communication with Friends and Family: Not on file  . Frequency of Social Gatherings with Friends and Family: Not on file  . Attends Religious Services: Not on file  . Active  Member of Clubs or Organizations: Not on file  . Attends Archivist Meetings: Not on file  . Marital Status: Not on file   Additional Social History:  Very vague on his social history will attempt collateral and see if it helps   He does allude to inpatient need but he may change his mind as he is voluntary  Allergies:   Allergies  Allergen Reactions  . Acetaminophen Rash, Itching and Nausea And Vomiting    Labs:  Results for orders placed or performed during the hospital encounter of 01/20/20 (from the past 48 hour(s))  Comprehensive metabolic panel     Status: Abnormal   Collection Time: 01/20/20 10:25 AM  Result Value Ref Range   Sodium 137 135 - 145 mmol/L   Potassium 3.7 3.5 - 5.1 mmol/L   Chloride 106 98 - 111 mmol/L   CO2 25 22 - 32 mmol/L   Glucose, Bld 103 (H) 70 - 99 mg/dL    Comment: Glucose reference range applies only to samples taken after fasting for at least 8 hours.   BUN 14 6 - 20 mg/dL   Creatinine, Ser 0.92 0.61 - 1.24 mg/dL   Calcium 8.9 8.9 - 10.3 mg/dL   Total Protein 6.8 6.5 - 8.1 g/dL   Albumin 3.8 3.5 - 5.0 g/dL   AST 22 15 - 41 U/L   ALT 17 0 - 44 U/L   Alkaline Phosphatase 81 38 - 126 U/L   Total Bilirubin 0.7 0.3 - 1.2 mg/dL   GFR calc non Af Amer >60 >60 mL/min   GFR calc Af Amer >60 >60 mL/min   Anion gap 6 5 - 15    Comment: Performed at [facility], [address], [place], [place] [address]  Ethanol     Status: None   Collection Time: 01/20/20 10:25 AM  Result Value Ref Range   Alcohol, Ethyl (B) <10 <10 mg/dL    Comment: (NOTE) Lowest detectable limit for serum alcohol is 10 mg/dL.  For medical purposes only. Performed at [facility], [address] [place], [place] [address]   cbc     Status: None   Collection Time: 01/20/20 10:25 AM  Result Value Ref Range   WBC 6.6 4.0 - 10.5 K/uL   RBC 5.29 4.22 - 5.81 MIL/uL   Hemoglobin 14.2 13.0 - 17.0 g/dL   HCT 43.2 39 - 52 %   MCV 81.7 80.0 - 100.0  fL   MCH 26.8 26.0 - 34.0 pg   MCHC 32.9 30.0 - 36.0 g/dL   RDW 14.7 11.5 - 15.5 %   Platelets 287 150 - 400 K/uL   nRBC 0.0 0.0 - 0.2 %    Comment: Performed at [facility], [address] [place],  [place] [address]    No current facility-administered medications for this encounter.   Current Outpatient Medications  Medication Sig Dispense Refill  . cyclobenzaprine (FLEXERIL) 5 MG tablet Take 1 tablet (5 mg total) by mouth 3 (three) times daily as needed. 15 tablet 0  . ketorolac (TORADOL) 10 MG tablet Take 1 tablet (10 mg total) by mouth every 8 (eight) hours. 15 tablet 0    Musculoskeletal: Strength & Muscle Tone: says he has chronic leg hip pain left --but new findings are not found  Gait & Station: limited by pain  Patient leans: n/a   Psychiatric Specialty Exam: Physical Exam  Review of Systems  Blood pressure 127/68, pulse (!) 53, temperature 97.7 F (36.5 C), temperature source Oral, resp. rate 18, height 6\' 1"  (1.854 m), weight 97.5 kg, SpO2 98 %.Body mass index is 28.37 kg/m.  Mental Status limited due to personality disorder and ODD isues  Alert rapport and eye contact poor Very vague approximate answers Many I do not know answers Mood irritable angry edgy frustrated Affect flat  No clear safety margin he is vague in general Not cooperative passive aggressive Denies frank psychosis or mania Memory cannot assess Fund of knowledge intelligence, judgement insight reliability all poor No shakes tics tremors  Concentration and attention poor Consciousness not clouded or fluctuant Abstraction poor  Speech loud irritable pressured                                                        Recall poor Language --no change Akathisia none Aims not needed  Assets not known  ADL's--not known  Sleep on and off Cognition impaired  Handedness not known      Treatment Plan Summary:  Patient with personality disorder, cocaine  intoxication, withdrawal and depression adjustment issues --third visit to ER where eventually he is asked to leave for lack of cooperation  This time at least for now he says he wants to be an inpatient on voluntary status  TTS attempting to find bed at this point  Currently he is not reliable on any medication needs at this time.         Disposition:  Inpatient admission if he remains cooperative  Eulas Post, MD 01/20/2020 11:35 AM

## 2020-01-20 NOTE — ED Triage Notes (Signed)
Pt to ED via POV, pt states that he is being seen for left hip pina. Pt states that he also wishes to speak with psychiatry. Pt reports thought of harming himself and others. Pt states that he has been seen in the past for thoughts of harming others. Pt denies plan as to how he would harm himself. Pt is cooperative at this time and in NAD.

## 2020-01-20 NOTE — ED Notes (Signed)
Pt dressed out in the interview rm with this tech and [place], EDT. Pt dressed out into burgundy scrubs. Pt belongings consist of a blue shirt, an orange shirt, a black belt, black Nike tennis shoes, white socks, blue jeans, black boxers, a red flip phone and a brown wallet. Pt denies having any money on him today. Pt calm and cooperative while dressing out. Pt wheeled to rm 23.

## 2020-01-20 NOTE — BH Assessment (Signed)
Patient can come down before 5pm  Call to give report: [phone]  Patient is to be admitted to [facility] by [name].  Attending Physician will be. [name].   Patient has been assigned to room 320, by [place], RN.   Intake Paper Work has been signed and placed on patient chart.  ER staff is aware of the admission: 1. Lattie Haw, ER Secretary  2. Archie Balboa, ER MD  3. Amy Patient's Nurse  4. THO Patient Access.

## 2020-01-20 NOTE — Tx Team (Signed)
Initial Treatment Plan 01/20/2020 6:42 PM [name] [id number]    PATIENT STRESSORS: Medication change or noncompliance Substance abuse   PATIENT STRENGTHS: Capable of independent living Motivation for treatment/growth   PATIENT IDENTIFIED PROBLEMS: Agitation  Substance abuse                   DISCHARGE CRITERIA:  Improved stabilization in mood, thinking, and/or behavior Verbal commitment to aftercare and medication compliance  PRELIMINARY DISCHARGE PLAN: Outpatient therapy Return to previous living arrangement  PATIENT/FAMILY INVOLVEMENT: This treatment plan has been presented to and reviewed with the patient, Samuel Molina. The patient has been given the opportunity to ask questions and make suggestions.  Mallie Darting, RN 01/20/2020, 6:42 PM

## 2020-01-20 NOTE — ED Notes (Signed)
Report given to [facility]  She reports that she is leaving early and that Warner Mccreedy will be coming in at 1715  She will have Warner Mccreedy call me when the pt may transfer

## 2020-01-21 DIAGNOSIS — F332 Major depressive disorder, recurrent severe without psychotic features: Secondary | ICD-10-CM

## 2020-01-21 DIAGNOSIS — M25559 Pain in unspecified hip: Secondary | ICD-10-CM

## 2020-01-21 DIAGNOSIS — F1410 Cocaine abuse, uncomplicated: Secondary | ICD-10-CM

## 2020-01-21 MED ORDER — CITALOPRAM HYDROBROMIDE 20 MG PO TABS
10.0000 mg | ORAL_TABLET | Freq: Every day | ORAL | Status: DC
  Administered 2020-01-22 – 2020-01-23 (×2): 10 mg via ORAL
  Filled 2020-01-21 (×2): qty 1

## 2020-01-21 MED ORDER — MELOXICAM 7.5 MG PO TABS
15.0000 mg | ORAL_TABLET | Freq: Every day | ORAL | Status: DC
  Administered 2020-01-21 – 2020-01-23 (×3): 15 mg via ORAL
  Filled 2020-01-21 (×3): qty 2

## 2020-01-21 MED ORDER — CITALOPRAM HYDROBROMIDE 20 MG PO TABS: 10.0000 mg | ORAL_TABLET | Freq: Every day | ORAL | Status: DC

## 2020-01-21 MED ORDER — HYDROXYZINE HCL 25 MG PO TABS
25.0000 mg | ORAL_TABLET | Freq: Four times a day (QID) | ORAL | Status: DC | PRN
  Administered 2020-01-21 – 2020-01-23 (×4): 25 mg via ORAL
  Filled 2020-01-21 (×4): qty 1

## 2020-01-21 NOTE — H&P (Signed)
Psychiatric Admission Assessment Adult  Patient Identification: Samuel Molina MRN:  [id number] Date of Evaluation:  01/21/2020 Chief Complaint:  Major depression [F32.9] Principal Diagnosis: Severe recurrent major depression without psychotic features ([place]) Diagnosis:  Principal Problem:   Severe recurrent major depression without psychotic features ([place]) Active Problems:   Major depression   Cocaine abuse ([place])   Hip pain  History of Present Illness: Patient seen and chart reviewed.  59 year old man presented voluntarily to the emergency room seeking assistance with symptoms of mood and substance abuse issues.  Patient's chief complaint is "I have been getting aggravated and feeling aggressive".  Patient is defensive at times regarding sharing information but was able with gentle interviewing to provide some information.  His mood has been feeling bad for a couple of weeks.  He feels like he is overwhelmed by stresses.  It is hard for him to put his finger on a single specific stress.  He talks quite a bit about how it makes him angry when people lie to him more missed lead him but declines to specify and exactly what way that had happened.  Sleep has been somewhat impaired chronically.  Appetite is adequate.  He does complain of pain in his left hip which was his original reason for coming to the emergency room.  Patient admits that he has been using crack cocaine recently and in total for a few months.  He states that yesterday in particular he was having suicidal and homicidal thoughts although he reports having no specific plan or particular target for either of those.  Denies any psychotic symptoms.  Denies alcohol use.  Not receiving any kind of outpatient psychiatric services. Associated Signs/Symptoms: Depression Symptoms:  insomnia, difficulty concentrating, suicidal thoughts without plan, anxiety, Duration of Depression Symptoms: No data recorded (Hypo) Manic Symptoms:   Impulsivity, Irritable Mood, Anxiety Symptoms:  Excessive Worry, Psychotic Symptoms:  Denies psychotic symptoms Duration of Psychotic Symptoms: No data recorded PTSD Symptoms: Negative Total Time spent with patient: 1 hour  Past Psychiatric History: Patient has presented to the emergency room several times but up until now had declined or resisted being involved in treatment.  No previous psychiatric hospitalization.  No previous psychiatric medicine.  Denies ever having tried to kill himself in the past.  Declines to specify about violence history.  Evidently no previous substance abuse treatment  Is the patient at risk to self? Yes.    Has the patient been a risk to self in the past 6 months? Yes.    Has the patient been a risk to self within the distant past? No.  Is the patient a risk to others? Yes.    Has the patient been a risk to others in the past 6 months? No.  Has the patient been a risk to others within the distant past? No.   Prior Inpatient Therapy:   Prior Outpatient Therapy:    Alcohol Screening: 1. How often do you have a drink containing alcohol?: Never 2. How many drinks containing alcohol do you have on a typical day when you are drinking?: 1 or 2 3. How often do you have six or more drinks on one occasion?: Never AUDIT-C Score: 0 4. How often during the last year have you found that you were not able to stop drinking once you had started?: Never 5. How often during the last year have you failed to do what was normally expected from you because of drinking?: Never 6. How often during the last year  have you needed a first drink in the morning to get yourself going after a heavy drinking session?: Never 7. How often during the last year have you had a feeling of guilt of remorse after drinking?: Never 8. How often during the last year have you been unable to remember what happened the night before because you had been drinking?: Never 9. Have you or someone else been  injured as a result of your drinking?: No 10. Has a relative or friend or a doctor or another health worker been concerned about your drinking or suggested you cut down?: No Alcohol Use Disorder Identification Test Final Score (AUDIT): 0 Alcohol Brief Interventions/Follow-up: AUDIT Score <7 follow-up not indicated Substance Abuse History in the last 12 months:  Yes.   Consequences of Substance Abuse: Psychological symptoms with worsening of anxiety and depression Previous Psychotropic Medications: No  Psychological Evaluations: No  Past Medical History:  Past Medical History:  Diagnosis Date  . GERD (gastroesophageal reflux disease)   . Obesity     Past Surgical History:  Procedure Laterality Date  . ESOPHAGOGASTRODUODENOSCOPY (EGD) WITH PROPOFOL N/A 09/20/2017   Procedure: ESOPHAGOGASTRODUODENOSCOPY (EGD) WITH PROPOFOL;  Surgeon: Lin Landsman, MD;  Location: [facility];  Service: Gastroenterology;  Laterality: N/A;  . KNEE SURGERY Right    Family History: History reviewed. No pertinent family history. Family Psychiatric  History: Patient denies any knowledge of family history Tobacco Screening: Have you used any form of tobacco in the last 30 days? (Cigarettes, Smokeless Tobacco, Cigars, and/or Pipes): Yes Tobacco use, Select all that apply: 5 or more cigarettes per day Are you interested in Tobacco Cessation Medications?: No, patient refused Counseled patient on smoking cessation including recognizing danger situations, developing coping skills and basic information about quitting provided: Refused/Declined practical counseling Social History:  Social History   Substance and Sexual Activity  Alcohol Use Not Currently     Social History   Substance and Sexual Activity  Drug Use Never    Additional Social History: Marital status: Single Does patient have children?: Yes How many children?: 2 How is patient's relationship with their children?: "pretty good"                          Allergies:   Allergies  Allergen Reactions  . Acetaminophen Rash, Itching and Nausea And Vomiting   Lab Results:  Results for orders placed or performed during the hospital encounter of 01/20/20 (from the past 48 hour(s))  Comprehensive metabolic panel     Status: Abnormal   Collection Time: 01/20/20 10:25 AM  Result Value Ref Range   Sodium 137 135 - 145 mmol/L   Potassium 3.7 3.5 - 5.1 mmol/L   Chloride 106 98 - 111 mmol/L   CO2 25 22 - 32 mmol/L   Glucose, Bld 103 (H) 70 - 99 mg/dL    Comment: Glucose reference range applies only to samples taken after fasting for at least 8 hours.   BUN 14 6 - 20 mg/dL   Creatinine, Ser 0.92 0.61 - 1.24 mg/dL   Calcium 8.9 8.9 - 10.3 mg/dL   Total Protein 6.8 6.5 - 8.1 g/dL   Albumin 3.8 3.5 - 5.0 g/dL   AST 22 15 - 41 U/L   ALT 17 0 - 44 U/L   Alkaline Phosphatase 81 38 - 126 U/L   Total Bilirubin 0.7 0.3 - 1.2 mg/dL   GFR calc non Af Amer >60 >60 mL/min   GFR  calc Af Amer >60 >60 mL/min   Anion gap 6 5 - 15    Comment: Performed at [facility], [address] [place], [place] [address]  Ethanol     Status: None   Collection Time: 01/20/20 10:25 AM  Result Value Ref Range   Alcohol, Ethyl (B) <10 <10 mg/dL    Comment: (NOTE) Lowest detectable limit for serum alcohol is 10 mg/dL.  For medical purposes only. Performed at [facility], [address] [place], [place] [address]   Salicylate level     Status: Abnormal   Collection Time: 01/20/20 10:25 AM  Result Value Ref Range   Salicylate Lvl <9.3 (L) 7.0 - 30.0 mg/dL    Comment: Performed at [facility], [address] [place], [place] [address]  Acetaminophen level     Status: Abnormal   Collection Time: 01/20/20 10:25 AM  Result Value Ref Range   Acetaminophen (Tylenol), Serum <10 (L) 10 - 30 ug/mL    Comment: (NOTE) Therapeutic concentrations vary significantly. A range of 10-30 ug/mL  may be an effective  concentration for many patients. However, some  are best treated at concentrations outside of this range. Acetaminophen concentrations >150 ug/mL at 4 hours after ingestion  and >50 ug/mL at 12 hours after ingestion are often associated with  toxic reactions.  Performed at [facility], [address] [place], [place] [address]   cbc     Status: None   Collection Time: 01/20/20 10:25 AM  Result Value Ref Range   WBC 6.6 4.0 - 10.5 K/uL   RBC 5.29 4.22 - 5.81 MIL/uL   Hemoglobin 14.2 13.0 - 17.0 g/dL   HCT 43.2 39 - 52 %   MCV 81.7 80.0 - 100.0 fL   MCH 26.8 26.0 - 34.0 pg   MCHC 32.9 30.0 - 36.0 g/dL   RDW 14.7 11.5 - 15.5 %   Platelets 287 150 - 400 K/uL   nRBC 0.0 0.0 - 0.2 %    Comment: Performed at [facility], [address], [place], [place] [address]  Urine Drug Screen, Qualitative     Status: Abnormal   Collection Time: 01/20/20 12:39 PM  Result Value Ref Range   Tricyclic, Ur Screen NONE DETECTED NONE DETECTED   Amphetamines, Ur Screen NONE DETECTED NONE DETECTED   MDMA (Ecstasy)Ur Screen NONE DETECTED NONE DETECTED   Cocaine Metabolite,Ur [place] POSITIVE (A) NONE DETECTED   Opiate, Ur Screen NONE DETECTED NONE DETECTED   Phencyclidine (PCP) Ur S NONE DETECTED NONE DETECTED   Cannabinoid 50 Ng, Ur [place] NONE DETECTED NONE DETECTED   Barbiturates, Ur Screen NONE DETECTED NONE DETECTED   Benzodiazepine, Ur Scrn NONE DETECTED NONE DETECTED   Methadone Scn, Ur NONE DETECTED NONE DETECTED    Comment: (NOTE) Tricyclics + metabolites, urine    Cutoff 1000 ng/mL Amphetamines + metabolites, urine  Cutoff 1000 ng/mL MDMA (Ecstasy), urine              Cutoff 500 ng/mL Cocaine Metabolite, urine          Cutoff 300 ng/mL Opiate + metabolites, urine        Cutoff 300 ng/mL Phencyclidine (PCP), urine         Cutoff 25 ng/mL Cannabinoid, urine                 Cutoff 50 ng/mL Barbiturates + metabolites, urine  Cutoff 200 ng/mL Benzodiazepine, urine  Cutoff 200 ng/mL Methadone, urine                   Cutoff 300 ng/mL  The urine drug screen provides only a preliminary, unconfirmed analytical test result and should not be used for non-medical purposes. Clinical consideration and professional judgment should be applied to any positive drug screen result due to possible interfering substances. A more specific alternate chemical method must be used in order to obtain a confirmed analytical result. Gas chromatography / mass spectrometry (GC/MS) is the preferred confirm atory method. Performed at [facility], [address] [place], [place] [address]   Respiratory Panel by RT PCR (Flu A&B, Covid) - Nasopharyngeal Swab     Status: None   Collection Time: 01/20/20  2:30 PM   Specimen: Nasopharyngeal Swab  Result Value Ref Range   SARS Coronavirus 2 by RT PCR NEGATIVE NEGATIVE    Comment: (NOTE) SARS-CoV-2 target nucleic acids are NOT DETECTED.  The SARS-CoV-2 RNA is generally detectable in upper respiratoy specimens during the acute phase of infection. The lowest concentration of SARS-CoV-2 viral copies this assay can detect is 131 copies/mL. A negative result does not preclude SARS-Cov-2 infection and should not be used as the sole basis for treatment or other patient management decisions. A negative result may occur with  improper specimen collection/handling, submission of specimen other than nasopharyngeal swab, presence of viral mutation(s) within the areas targeted by this assay, and inadequate number of viral copies (<131 copies/mL). A negative result must be combined with clinical observations, patient history, and epidemiological information. The expected result is Negative.  Fact Sheet for Patients:  PinkCheek.be  Fact Sheet for Healthcare Providers:  GravelBags.it  This test is no t yet approved or cleared by the [place] FDA and  has been  authorized for detection and/or diagnosis of SARS-CoV-2 by FDA under an Emergency Use Authorization (EUA). This EUA will remain  in effect (meaning this test can be used) for the duration of the COVID-19 declaration under Section 564(b)(1) of the Act, 21 U.S.C. section 360bbb-3(b)(1), unless the authorization is terminated or revoked sooner.     Influenza A by PCR NEGATIVE NEGATIVE   Influenza B by PCR NEGATIVE NEGATIVE    Comment: (NOTE) The Xpert Xpress SARS-CoV-2/FLU/RSV assay is intended as an aid in  the diagnosis of influenza from Nasopharyngeal swab specimens and  should not be used as a sole basis for treatment. Nasal washings and  aspirates are unacceptable for Xpert Xpress SARS-CoV-2/FLU/RSV  testing.  Fact Sheet for Patients: PinkCheek.be  Fact Sheet for Healthcare Providers: GravelBags.it  This test is not yet approved or cleared by the [place] FDA and  has been authorized for detection and/or diagnosis of SARS-CoV-2 by  FDA under an Emergency Use Authorization (EUA). This EUA will remain  in effect (meaning this test can be used) for the duration of the  Covid-19 declaration under Section 564(b)(1) of the Act, 21  U.S.C. section 360bbb-3(b)(1), unless the authorization is  terminated or revoked. Performed at [facility], [address] [place],  [address]     Blood Alcohol level:  Lab Results  Component Value Date   [facility] <10 01/20/2020   ETH <10 82/95/6213    Metabolic Disorder Labs:  No results found for: HGBA1C, MPG No results found for: PROLACTIN No results found for: CHOL, TRIG, HDL, CHOLHDL, VLDL, LDLCALC  Current Medications: Current Facility-Administered Medications  Medication Dose Route Frequency Provider Last Rate Last Admin  .  alum & mag hydroxide-simeth (MAALOX/MYLANTA) 200-200-20 MG/5ML suspension 30 mL  30 mL Oral Q4H PRN Eulas Post, MD      . Derrill Memo ON  01/22/2020] citalopram (CELEXA) tablet 10 mg  10 mg Oral Daily Djuan Talton T, MD      . hydrOXYzine (ATARAX/VISTARIL) tablet 25 mg  25 mg Oral Q6H PRN Reegan Mctighe T, MD      . magnesium hydroxide (MILK OF MAGNESIA) suspension 30 mL  30 mL Oral Daily PRN Eulas Post, MD      . meloxicam [facility]) tablet 15 mg  15 mg Oral Daily Shanica Castellanos, [name], MD   15 mg at 01/21/20 1712   PTA Medications: Medications Prior to Admission  Medication Sig Dispense Refill Last Dose  . cyclobenzaprine (FLEXERIL) 5 MG tablet Take 1 tablet (5 mg total) by mouth 3 (three) times daily as needed. (Patient not taking: Reported on 01/20/2020) 15 tablet 0   . ketorolac (TORADOL) 10 MG tablet Take 1 tablet (10 mg total) by mouth every 8 (eight) hours. (Patient not taking: Reported on 01/20/2020) 15 tablet 0     Musculoskeletal: Strength & Muscle Tone: within normal limits Gait & Station: normal Patient leans: N/A  Psychiatric Specialty Exam: Physical Exam Vitals and nursing note reviewed.  Constitutional:      Appearance: He is well-developed.  HENT:     Head: Normocephalic and atraumatic.  Eyes:     Conjunctiva/sclera: Conjunctivae normal.     Pupils: Pupils are equal, round, and reactive to light.  Cardiovascular:     Heart sounds: Normal heart sounds.  Pulmonary:     Effort: Pulmonary effort is normal.  Abdominal:     Palpations: Abdomen is soft.  Musculoskeletal:        General: Normal range of motion.     Cervical back: Normal range of motion.  Skin:    General: Skin is warm and dry.  Neurological:     General: No focal deficit present.     Mental Status: He is alert.  Psychiatric:        Attention and Perception: Attention normal.        Mood and Affect: Mood is anxious and depressed. Affect is angry.        Speech: Speech normal.        Behavior: Behavior is agitated. Behavior is not aggressive. Behavior is cooperative.        Thought Content: Thought content does not include homicidal or  suicidal ideation.        Cognition and Memory: Cognition is impaired.        Judgment: Judgment is impulsive.     Review of Systems  Constitutional: Negative.   HENT: Negative.   Eyes: Negative.   Respiratory: Negative.   Cardiovascular: Negative.   Gastrointestinal: Negative.   Musculoskeletal: Negative.   Skin: Negative.   Neurological: Negative.   Psychiatric/Behavioral: Positive for behavioral problems, dysphoric mood and suicidal ideas. The patient is nervous/anxious.     Blood pressure (!) 150/86, pulse (!) 56, temperature 97.9 F (36.6 C), temperature source Oral, resp. rate 19, height 6\' 1"  (1.854 m), weight 97.5 kg, SpO2 96 %.Body mass index is 28.37 kg/m.  General Appearance: Casual  Eye Contact:  Fair  Speech:  Clear and Coherent  Volume:  Normal  Mood:  Anxious, Dysphoric and Irritable  Affect:  Congruent  Thought Process:  Coherent  Orientation:  Full (Time, Place, and Person)  Thought Content:  Logical, Paranoid Ideation and Paranoid ideation  in the sense of excessive wariness defensiveness and mistrust of the motives of others but not in the sense of psychosis  Suicidal Thoughts:  Yes.  without intent/plan  Homicidal Thoughts:  No  Memory:  Immediate;   Fair Recent;   Fair Remote;   Fair  Judgement:  Fair  Insight:  Fair  Psychomotor Activity:  Normal  Concentration:  Concentration: Fair  Recall:  [employer] of Knowledge:  Fair  Language:  Fair  Akathisia:  No  Handed:  Right  AIMS (if indicated):     Assets:  Desire for Improvement Housing Physical Health Resilience Social Support  ADL's:  Intact  Cognition:  WNL  Sleep:  Number of Hours: 7.75    Treatment Plan Summary: Daily contact with patient to assess and evaluate symptoms and progress in treatment, Medication management and Plan This is a 59 year old man without past psychiatric treatment history who is presenting with symptoms of depression and anxiety.  No occurring is recent use of  cocaine.  Contribution of substance abuse to mood symptoms not entirely clear but for now he meets criteria for major depression as well as cocaine abuse.  Patient recently having suicidal thoughts but without intent or plan and with no past suicidality.  No psychosis.  Spent time with patient trying to build rapport and suggest options for treatment.  Encouraged him to consider starting medication for depression.  Patient consented to beginning treatment with citalopram for depression.  We also discussed substance abuse treatment and he is tentatively requesting referral to inpatient treatment which will be discussed with the treatment team tomorrow.  Patient is complaining of pain in his left hip and expresses frustration at not receiving more treatment or diagnosis in the emergency room.  I have placed a consultation to orthopedic surgery to see if they can provide any more clarity or suggestions to the patient and meanwhile we will prescribe Mobic for chronic pain.  Observation Level/Precautions:  15 minute checks  Laboratory:  Chemistry Profile  Psychotherapy:    Medications:    Consultations:    Discharge Concerns:    Estimated LOS:  Other:     Physician Treatment Plan for Primary Diagnosis: Severe recurrent major depression without psychotic features ([place]) Long Term Goal(s): Improvement in symptoms so as ready for discharge  Short Term Goals: Ability to verbalize feelings will improve, Ability to disclose and discuss suicidal ideas and Ability to demonstrate self-control will improve  Physician Treatment Plan for Secondary Diagnosis: Principal Problem:   Severe recurrent major depression without psychotic features ([place]) Active Problems:   Major depression   Cocaine abuse ([place])   Hip pain  Long Term Goal(s): Improvement in symptoms so as ready for discharge  Short Term Goals: Ability to maintain clinical measurements within normal limits will improve and Ability to identify triggers  associated with substance abuse/mental health issues will improve  I certify that inpatient services furnished can reasonably be expected to improve the patient's condition.    Alethia Berthold, MD 9/29/20215:34 PM
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
## 2020-01-21 NOTE — Progress Notes (Signed)
D: Pt alert and oriented. Pt denies experiencing any anxiety/depression at this time. Pt reports experiencing 10/10 chronic left hip pain. Pt denies experiencing any SI/HI, or AVH at this time. Pt has been calm and cooperative with staff today.   A: Scheduled medications administered to pt, per MD orders. Support and encouragement provided. Frequent verbal contact made. Routine safety checks conducted q15 minutes.   R: No adverse drug reactions noted. Pt verbally contracts for safety at this time. Pt complaint with medications. Pt interacts well with others on the unit. Pt remains safe at this time. Will continue to monitor.

## 2020-01-21 NOTE — BHH Counselor (Signed)
Adult Comprehensive Assessment  Patient ID: Samuel Molina, male   DOB: 01-07-1961, 59 y.o.   MRN: [id number]  Information Source: Information source: Patient  Current Stressors:  Patient states their primary concerns and needs for treatment are:: "depression, little anxiety, I've been getting fed up with people" Patient states their goals for this hospitilization and ongoing recovery are:: "get my anger under control" Educational / Learning stressors: Pt denies. Employment / Job issues: Pt denies. Family Relationships: "somePublishing copy / Lack of resources (include bankruptcy): Pt denies. Housing / Lack of housing: Pt denies. Physical health (include injuries & life threatening diseases): Pt denies. Social relationships: "mostly between me and my friends" Substance abuse: "Cocaine" Bereavement / Loss: Pt denies.  Living/Environment/Situation:  Living Arrangements: Alone How long has patient lived in current situation?: "2 1/2 years" What is atmosphere in current home: Comfortable  Family History:  Marital status: Single Does patient have children?: Yes How many children?: 2 How is patient's relationship with their children?: "pretty good"  Childhood History:  By whom was/is the patient raised?: Both parents Description of patient's relationship with caregiver when they were a child: "great" Patient's description of current relationship with people who raised him/her: Pt reports that his mother is deceased.  Pt describes relationship with father as "all right". How were you disciplined when you got in trouble as a child/adolescent?: "physically and mentally" Does patient have siblings?: Yes Number of Siblings: 1 Description of patient's current relationship with siblings: "great" Did patient suffer any verbal/emotional/physical/sexual abuse as a child?: No Did patient suffer from severe childhood neglect?: No Has patient ever been sexually abused/assaulted/raped as an  adolescent or adult?: No Was the patient ever a victim of a crime or a disaster?: No Witnessed domestic violence?: Yes Has patient been affected by domestic violence as an adult?: No  Education:  Highest grade of school patient has completed: [employer]er, retail Currently a Ship broker?: No Learning disability?: No  Employment/Work Situation:   Employment situation: Employed 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: No Alcohol/Substance Abuse Treatment Hx: Denies past history Has alcohol/substance abuse ever caused legal problems?: No  Social Support System:   Patient's Community Support System: Fair Describe Community Support System: "sister" Type of faith/religion: Pt denies. How does patient's faith help to cope with current illness?: Pt denies.  Leisure/Recreation:   Do You Have Hobbies?: Yes Leisure and Hobbies: "poetry, basketball"  Strengths/Needs:   What is the patient's perception of their strengths?: "everything" Patient states they can use these personal strengths during their treatment to contribute to their recovery: Pt denies. Patient states these barriers may affect/interfere with their treatment: Pt denies. Patient states these barriers may affect their return to the community: Pt denies.  Discharge Plan:   Currently receiving community mental health services:  No Patient states concerns and preferences for aftercare planning are: Pt reports that he is looking for inpatient. Patient states  they will know when they are safe and ready for discharge when: "when I'm comfortable" Does patient have access to transportation?: Yes Does patient have financial barriers related to discharge medications?: Yes Patient description of barriers related to discharge medications: Chart indicates that patient does not have insurance. Will patient be returning to same living situation after discharge?: Yes  Summary/Recommendations:   Summary and Recommendations (to be completed by the evaluator): Patient is a 59 year old male [place] [facility]  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  He presents to the hospital following self-reports of increased agitation and thoughts of harming self and others.  He has a primary diagnosis of Major Depressive Disorder, Moderate.  Recommendations include: crisis stabilization, therapeutic milieu, encourage group attendance and participation, medication management for detox/mood stabilization and development of comprehensive mental wellness/sobriety plan.  Rozann Lesches. 01/21/2020

## 2020-01-21 NOTE — Plan of Care (Signed)
  Problem: Education: Goal: Knowledge of [facility] Education information/materials will improve Outcome: Progressing Goal: Emotional status will improve Outcome: Progressing Goal: Mental status will improve Outcome: Progressing Goal: Verbalization of understanding the information provided will improve Outcome: Progressing   Problem: Safety: Goal: Periods of time without injury will increase Outcome: Progressing   Problem: Education: Goal: Utilization of techniques to improve thought processes will improve Outcome: Progressing Goal: Knowledge of the prescribed therapeutic regimen will improve Outcome: Progressing   Problem: Safety: Goal: Ability to disclose and discuss suicidal ideas will improve Outcome: Progressing Goal: Ability to identify and utilize support systems that promote safety will improve Outcome: Progressing

## 2020-01-21 NOTE — Progress Notes (Signed)
Patient alert and oriented x 4 affect is blunted thoughts are organized and coherent he appears agitated and irritable not receptive to staff, he was complaining of right hip pain. writer contacted HCP and pain medication was ordered. Patient was ordered pain medication and he was complaint, emotional support and encouragement j offered to patient, 15 minutes safety checks maintained will continue to monitor.

## 2020-01-21 NOTE — BHH Suicide Risk Assessment (Signed)
[facility] Admission Suicide Risk Assessment   Nursing information obtained from:  Patient Demographic factors:  NA Current Mental Status:  NA Loss Factors:  NA Historical Factors:  NA Risk Reduction Factors:  NA  Total Time spent with patient: 1 hour Principal Problem: Severe recurrent major depression without psychotic features ([place]) Diagnosis:  Principal Problem:   Severe recurrent major depression without psychotic features ([place]) Active Problems:   Major depression   Cocaine abuse ([place])   Hip pain  Subjective Data: Patient seen and chart reviewed.  59 year old man presented voluntarily to the emergency room seeking help with symptoms of anxiety depression irritability and substance abuse.  Patient reports yesterday he was having vague thoughts of killing himself or of assaulting other people.  He says these have diminished today and he has no intention or plan of following through but he continues to have irritable dysphoric mood.  He is currently cooperative with treatment with no sign of psychosis  Continued Clinical Symptoms:  Alcohol Use Disorder Identification Test Final Score (AUDIT): 0 The "Alcohol Use Disorders Identification Test", Guidelines for Use in Primary Care, Second Edition.  World Pharmacologist [facility]). Score between 0-7:  no or low risk or alcohol related problems. Score between 8-15:  moderate risk of alcohol related problems. Score between 16-19:  high risk of alcohol related problems. Score 20 or above:  warrants further diagnostic evaluation for alcohol dependence and treatment.   CLINICAL FACTORS:   Depression:   Comorbid alcohol abuse/dependence Dysthymia Alcohol/Substance Abuse/Dependencies   Musculoskeletal: Strength & Muscle Tone: within normal limits Gait & Station: normal Patient leans: N/A  Psychiatric Specialty Exam: Physical Exam Vitals and nursing note reviewed.  Constitutional:      Appearance: He is well-developed.  HENT:     Head:  Normocephalic and atraumatic.  Eyes:     Conjunctiva/sclera: Conjunctivae normal.     Pupils: Pupils are equal, round, and reactive to light.  Cardiovascular:     Heart sounds: Normal heart sounds.  Pulmonary:     Effort: Pulmonary effort is normal.  Abdominal:     Palpations: Abdomen is soft.  Musculoskeletal:        General: Normal range of motion.     Cervical back: Normal range of motion.  Skin:    General: Skin is warm and dry.  Neurological:     General: No focal deficit present.     Mental Status: He is alert.  Psychiatric:        Attention and Perception: Attention normal.        Mood and Affect: Mood is anxious. Affect is angry.        Speech: Speech normal.        Behavior: Behavior is agitated. Behavior is not aggressive or hyperactive.        Thought Content: Thought content does not include homicidal or suicidal ideation.        Cognition and Memory: Cognition normal.        Judgment: Judgment is impulsive.     Review of Systems  Constitutional: Negative.   HENT: Negative.   Eyes: Negative.   Respiratory: Negative.   Cardiovascular: Negative.   Gastrointestinal: Negative.   Musculoskeletal: Negative.   Skin: Negative.   Neurological: Negative.   Psychiatric/Behavioral: Positive for dysphoric mood and suicidal ideas. The patient is nervous/anxious.     Blood pressure (!) 150/86, pulse (!) 56, temperature 97.9 F (36.6 C), temperature source Oral, resp. rate 19, height 6\' 1"  (1.854 m), weight 97.5  kg, SpO2 96 %.Body mass index is 28.37 kg/m.  General Appearance: Casual  Eye Contact:  Fair  Speech:  Normal Rate  Volume:  Increased  Mood:  Irritable  Affect:  Congruent  Thought Process:  Coherent  Orientation:  Full (Time, Place, and Person)  Thought Content:  Logical and Paranoid Ideation  Suicidal Thoughts:  Yes.  without intent/plan  Homicidal Thoughts:  Yes.  without intent/plan  Memory:  Immediate;   Fair Recent;   Fair Remote;   Fair  Judgement:   Fair  Insight:  Fair  Psychomotor Activity:  Normal  Concentration:  Concentration: Fair  Recall:  [employer] of Knowledge:  Fair  Language:  Fair  Akathisia:  No  Handed:  Right  AIMS (if indicated):     Assets:  Desire for Improvement Housing Physical Health Resilience Social Support  ADL's:  Intact  Cognition:  WNL  Sleep:  Number of Hours: 7.75      COGNITIVE FEATURES THAT CONTRIBUTE TO RISK:  Thought constriction (tunnel vision)    SUICIDE RISK:   Mild:  Suicidal ideation of limited frequency, intensity, duration, and specificity.  There are no identifiable plans, no associated intent, mild dysphoria and related symptoms, good self-control (both objective and subjective assessment), few other risk factors, and identifiable protective factors, including available and accessible social support.  PLAN OF CARE: Continue 15-minute checks.  Review labs.  Discussed medication management with patient and make changes as appropriate.  Full treatment team will meet with patient and discuss ultimate disposition including possible substance abuse treatment.  Review suicidality and dangerousness prior to discharge  I certify that inpatient services furnished can reasonably be expected to improve the patient's condition.   Alethia Berthold, MD 01/21/2020, 5:29 PM

## 2020-01-22 DIAGNOSIS — F332 Major depressive disorder, recurrent severe without psychotic features: Principal | ICD-10-CM

## 2020-01-22 MED ORDER — METHOCARBAMOL 500 MG PO TABS
750.0000 mg | ORAL_TABLET | Freq: Four times a day (QID) | ORAL | Status: DC | PRN
  Administered 2020-01-22 – 2020-01-23 (×2): 750 mg via ORAL
  Filled 2020-01-22 (×2): qty 2

## 2020-01-22 MED ORDER — TRAZODONE HCL 100 MG PO TABS
100.0000 mg | ORAL_TABLET | Freq: Every evening | ORAL | Status: DC | PRN
  Administered 2020-01-22: 100 mg via ORAL
  Filled 2020-01-22: qty 1

## 2020-01-22 NOTE — BHH Group Notes (Signed)
Adult Psychoeducational Group Note  Date:  01/22/2020 Time:  6:59 AM  Group Topic/Focus:  Wrap-Up Group:   The focus of this group is to help patients review their daily goal of treatment and discuss progress on daily workbooks.  Participation Level:  Active  Participation Quality:  Appropriate  Affect:  Appropriate  Cognitive:  Appropriate  Insight: Appropriate  Engagement in Group:  Engaged  Modes of Intervention:  Discussion  Additional Comments:  Discussion was about preventative actions and personal hygiene  Samuel Molina 01/22/2020, 6:59 AM

## 2020-01-22 NOTE — Progress Notes (Signed)
Patient is quiet and reserved upon approach.  He denies SI/HI/AVH/ depression at this encounter.  He does endorse pain in his left hip but states his pain level is at a 4 on 0-10 scale. He inquires about getting a PRN for help with pain, but was receptive to explanation about medication lasting for 24 hrs. He states he will speak with the doctor tomorrow about his pain.  He endorses some mild anxiety and received prescribed medication to help with symptoms.  He remains safe on the unit at this time with 15 minute safety checks and informed to contact staff with any concerns.     Cleo Butler-Nicholson, LPN

## 2020-01-22 NOTE — Tx Team (Signed)
Interdisciplinary Treatment and Diagnostic Plan Update  01/22/2020 Time of Session: 9:00AM Samuel Molina MRN: [id number]  Principal Diagnosis: Severe recurrent major depression without psychotic features [facility])  Secondary Diagnoses: Principal Problem:   Severe recurrent major depression without psychotic features ([place]) Active Problems:   Major depression   Cocaine abuse ([place])   Hip pain   Current Medications:  Current Facility-Administered Medications  Medication Dose Route Frequency Provider Last Rate Last Admin  . alum & mag hydroxide-simeth (MAALOX/MYLANTA) 200-200-20 MG/5ML suspension 30 mL  30 mL Oral Q4H PRN Eulas Post, MD      . citalopram (CELEXA) tablet 10 mg  10 mg Oral Daily Clapacs, [name], MD   10 mg at 01/22/20 0804  . hydrOXYzine (ATARAX/VISTARIL) tablet 25 mg  25 mg Oral Q6H PRN Clapacs, [name], MD   25 mg at 01/21/20 2129  . magnesium hydroxide (MILK OF MAGNESIA) suspension 30 mL  30 mL Oral Daily PRN Eulas Post, MD      . meloxicam [facility]) tablet 15 mg  15 mg Oral Daily Clapacs, [name], MD   15 mg at 01/22/20 5809   PTA Medications: Medications Prior to Admission  Medication Sig Dispense Refill Last Dose  . cyclobenzaprine (FLEXERIL) 5 MG tablet Take 1 tablet (5 mg total) by mouth 3 (three) times daily as needed. (Patient not taking: Reported on 01/20/2020) 15 tablet 0   . ketorolac (TORADOL) 10 MG tablet Take 1 tablet (10 mg total) by mouth every 8 (eight) hours. (Patient not taking: Reported on 01/20/2020) 15 tablet 0     Patient Stressors: Medication change or noncompliance Substance abuse  Patient Strengths: Capable of independent living Motivation for treatment/growth  Treatment Modalities: Medication Management, Group therapy, Case management,  1 to 1 session with clinician, Psychoeducation, Recreational therapy.   Physician Treatment Plan for Primary Diagnosis: Severe recurrent major depression without psychotic features ([place]) Long Term  Goal(s): Improvement in symptoms so as ready for discharge Improvement in symptoms so as ready for discharge   Short Term Goals: Ability to verbalize feelings will improve Ability to disclose and discuss suicidal ideas Ability to demonstrate self-control will improve Ability to maintain clinical measurements within normal limits will improve Ability to identify triggers associated with substance abuse/mental health issues will improve  Medication Management: Evaluate patient's response, side effects, and tolerance of medication regimen.  Therapeutic Interventions: 1 to 1 sessions, Unit Group sessions and Medication administration.  Evaluation of Outcomes: Not Met  Physician Treatment Plan for Secondary Diagnosis: Principal Problem:   Severe recurrent major depression without psychotic features ([place]) Active Problems:   Major depression   Cocaine abuse ([place])   Hip pain  Long Term Goal(s): Improvement in symptoms so as ready for discharge Improvement in symptoms so as ready for discharge   Short Term Goals: Ability to verbalize feelings will improve Ability to disclose and discuss suicidal ideas Ability to demonstrate self-control will improve Ability to maintain clinical measurements within normal limits will improve Ability to identify triggers associated with substance abuse/mental health issues will improve     Medication Management: Evaluate patient's response, side effects, and tolerance of medication regimen.  Therapeutic Interventions: 1 to 1 sessions, Unit Group sessions and Medication administration.  Evaluation of Outcomes: Not Met   RN Treatment Plan for Primary Diagnosis: Severe recurrent major depression without psychotic features ([place]) Long Term Goal(s): Knowledge of disease and therapeutic regimen to maintain health will improve  Short Term Goals: Ability to remain free from injury will improve, Ability  to verbalize frustration and anger appropriately will  improve, Ability to demonstrate self-control, Ability to participate in decision making will improve, Ability to verbalize feelings will improve, Ability to disclose and discuss suicidal ideas, Ability to identify and develop effective coping behaviors will improve and Compliance with prescribed medications will improve  Medication Management: RN will administer medications as ordered by provider, will assess and evaluate patient's response and provide education to patient for prescribed medication. RN will report any adverse and/or side effects to prescribing provider.  Therapeutic Interventions: 1 on 1 counseling sessions, Psychoeducation, Medication administration, Evaluate responses to treatment, Monitor vital signs and CBGs as ordered, Perform/monitor CIWA, COWS, AIMS and Fall Risk screenings as ordered, Perform wound care treatments as ordered.  Evaluation of Outcomes: Not Met   LCSW Treatment Plan for Primary Diagnosis: Severe recurrent major depression without psychotic features ([place]) Long Term Goal(s): Safe transition to appropriate next level of care at discharge, Engage patient in therapeutic group addressing interpersonal concerns.  Short Term Goals: Engage patient in aftercare planning with referrals and resources, Increase social support, Increase ability to appropriately verbalize feelings, Increase emotional regulation, Facilitate acceptance of mental health diagnosis and concerns, Facilitate patient progression through stages of change regarding substance use diagnoses and concerns, Identify triggers associated with mental health/substance abuse issues and Increase skills for wellness and recovery  Therapeutic Interventions: Assess for all discharge needs, 1 to 1 time with Social worker, Explore available resources and support systems, Assess for adequacy in community support network, Educate family and significant other(s) on suicide prevention, Complete Psychosocial Assessment,  Interpersonal group therapy.  Evaluation of Outcomes: Not Met   Progress in Treatment: Attending groups: No. Participating in groups: No. Taking medication as prescribed: Yes. Toleration medication: Yes. Family/Significant other contact made: Yes, individual(s) contacted:  SPE completed with patient and patient's sister. Patient understands diagnosis: Yes. Discussing patient identified problems/goals with staff: Yes. Medical problems stabilized or resolved: Yes. Denies suicidal/homicidal ideation: Yes. Issues/concerns per patient self-inventory: No. Other: none  New problem(s) identified: No, Describe:  none  New Short Term/Long Term Goal(s): detox, elimination of symptoms of psychosis, medication management for mood stabilization; elimination of SI thoughts; development of comprehensive mental wellness/sobriety plan.  Patient Goals:  "try to get better"  Discharge Plan or Barriers: Patient has requested a referral to [place] [facility] [place]. CSW will assist.   Reason for Continuation of Hospitalization: Aggression Anxiety Depression Medication stabilization  Estimated Length of Stay:  1-7 days  Attendees: Patient: Samuel Molina 01/22/2020 12:57 PM  Physician: [name], MD 01/22/2020 12:57 PM  Nursing: Collier Bullock, RN 01/22/2020 12:57 PM  RN Care Manager: 01/22/2020 12:57 PM  Social Worker: Assunta Curtis, LCSW 01/22/2020 12:57 PM  Recreational Therapist:  01/22/2020 12:57 PM  Other: Dr. Domingo Cocking, MD 01/22/2020 12:57 PM  Other:  01/22/2020 12:57 PM  Other: 01/22/2020 12:57 PM    Scribe for Treatment Team: Rozann Lesches, LCSW 01/22/2020 12:57 PM

## 2020-01-22 NOTE — BHH Counselor (Signed)
CSW faxed referrals to [place] and [place].  CSW received confirmation the fax was successful.  Assunta Curtis, MSW, LCSW 01/22/2020 1:30 PM

## 2020-01-22 NOTE — Progress Notes (Signed)
[facility] MD Progress Note  01/22/2020 2:25 PM Samuel Molina  MRN:  [id number] Subjective: Follow-up for this 59 year old man admitted with depression suicidal and homicidal ideation and substance abuse.  Patient met with treatment team today.  He was forthcoming and appropriate in conversation.  He was able to discuss some of his insights about his emotional difficulties and identified him as problems he wanted to work on.  Patient did not have any specific new complaint different from yesterday.  Does not report any acute suicidal or homicidal intent today.  He appears to be interacting appropriately tolerating medication well.  I reviewed the note from orthopedic surgery yesterday.  Appreciate Dr. Harlow Mares efforts very much.  Plan is for conservative management of pain and orthopedic follow-up in the future.  Patient continues to express interest in substance abuse treatment including inpatient treatment and this has been communicated to the full treatment team. Principal Problem: Severe recurrent major depression without psychotic features ([place]) Diagnosis: Principal Problem:   Severe recurrent major depression without psychotic features ([place]) Active Problems:   Major depression   Cocaine abuse ([place])   Hip pain  Total Time spent with patient: 30 minutes  Past Psychiatric History: Past history of some struggle with substance abuse and what sounds like longstanding irritability but with worsening symptoms recently.  No known past psychiatric treatment  Past Medical History:  Past Medical History:  Diagnosis Date  . GERD (gastroesophageal reflux disease)   . Obesity     Past Surgical History:  Procedure Laterality Date  . ESOPHAGOGASTRODUODENOSCOPY (EGD) WITH PROPOFOL N/A 09/20/2017   Procedure: ESOPHAGOGASTRODUODENOSCOPY (EGD) WITH PROPOFOL;  Surgeon: Lin Landsman, MD;  Location: [place];  Service: Gastroenterology;  Laterality: N/A;  . KNEE SURGERY Right    Family History:  History reviewed. No pertinent family history. Family Psychiatric  History: None reported Social History:  Social History   Substance and Sexual Activity  Alcohol Use Not Currently     Social History   Substance and Sexual Activity  Drug Use Never    Social History   Socioeconomic History  . Marital status: Single    Spouse name: Not on file  . Number of children: Not on file  . Years of education: Not on file  . Highest education level: Not on file  Occupational History  . Not on file  Tobacco Use  . Smoking status: Current Every Day Smoker    Packs/day: 0.50    Types: Cigarettes  . Smokeless tobacco: Never Used  Vaping Use  . Vaping Use: Never used  Substance and Sexual Activity  . Alcohol use: Not Currently  . Drug use: Never  . Sexual activity: Not on file  Other Topics Concern  . Not on file  Social History Narrative  . Not on file   Social Determinants of Health   Financial Resource Strain:   . Difficulty of Paying Living Expenses: Not on file  Food Insecurity:   . Worried About Charity fundraiser in the Last Year: Not on file  . Ran Out of Food in the Last Year: Not on file  Transportation Needs:   . Lack of Transportation (Medical): Not on file  . Lack of Transportation (Non-Medical): Not on file  Physical Activity:   . Days of Exercise per Week: Not on file  . Minutes of Exercise per Session: Not on file  Stress:   . Feeling of Stress : Not on file  Social Connections:   . Frequency of Communication  with Friends and Family: Not on file  . Frequency of Social Gatherings with Friends and Family: Not on file  . Attends Religious Services: Not on file  . Active Member of Clubs or Organizations: Not on file  . Attends Archivist Meetings: Not on file  . Marital Status: Not on file   Additional Social History:                         Sleep: Fair  Appetite:  Fair  Current Medications: Current Facility-Administered  Medications  Medication Dose Route Frequency Provider Last Rate Last Admin  . alum & mag hydroxide-simeth (MAALOX/MYLANTA) 200-200-20 MG/5ML suspension 30 mL  30 mL Oral Q4H PRN Eulas Post, MD      . citalopram (CELEXA) tablet 10 mg  10 mg Oral Daily Marda Breidenbach, [name], MD   10 mg at 01/22/20 0804  . hydrOXYzine (ATARAX/VISTARIL) tablet 25 mg  25 mg Oral Q6H PRN Yamila Cragin, [name], MD   25 mg at 01/21/20 2129  . magnesium hydroxide (MILK OF MAGNESIA) suspension 30 mL  30 mL Oral Daily PRN Eulas Post, MD      . meloxicam [facility]) tablet 15 mg  15 mg Oral Daily Zebbie Ace, [name], MD   15 mg at 01/22/20 7482    Lab Results:  Results for orders placed or performed during the hospital encounter of 01/20/20 (from the past 48 hour(s))  Respiratory Panel by RT PCR (Flu A&B, Covid) - Nasopharyngeal Swab     Status: None   Collection Time: 01/20/20  2:30 PM   Specimen: Nasopharyngeal Swab  Result Value Ref Range   SARS Coronavirus 2 by RT PCR NEGATIVE NEGATIVE    Comment: (NOTE) SARS-CoV-2 target nucleic acids are NOT DETECTED.  The SARS-CoV-2 RNA is generally detectable in upper respiratoy specimens during the acute phase of infection. The lowest concentration of SARS-CoV-2 viral copies this assay can detect is 131 copies/mL. A negative result does not preclude SARS-Cov-2 infection and should not be used as the sole basis for treatment or other patient management decisions. A negative result may occur with  improper specimen collection/handling, submission of specimen other than nasopharyngeal swab, presence of viral mutation(s) within the areas targeted by this assay, and inadequate number of viral copies (<131 copies/mL). A negative result must be combined with clinical observations, patient history, and epidemiological information. The expected result is Negative.  Fact Sheet for Patients:  PinkCheek.be  Fact Sheet for Healthcare Providers:   GravelBags.it  This test is no t yet approved or cleared by the [place] FDA and  has been authorized for detection and/or diagnosis of SARS-CoV-2 by FDA under an Emergency Use Authorization (EUA). This EUA will remain  in effect (meaning this test can be used) for the duration of the COVID-19 declaration under Section 564(b)(1) of the Act, 21 U.S.C. section 360bbb-3(b)(1), unless the authorization is terminated or revoked sooner.     Influenza A by PCR NEGATIVE NEGATIVE   Influenza B by PCR NEGATIVE NEGATIVE    Comment: (NOTE) The Xpert Xpress SARS-CoV-2/FLU/RSV assay is intended as an aid in  the diagnosis of influenza from Nasopharyngeal swab specimens and  should not be used as a sole basis for treatment. Nasal washings and  aspirates are unacceptable for Xpert Xpress SARS-CoV-2/FLU/RSV  testing.  Fact Sheet for Patients: PinkCheek.be  Fact Sheet for Healthcare Providers: GravelBags.it  This test is not yet approved or cleared by the [place] FDA  and  has been authorized for detection and/or diagnosis of SARS-CoV-2 by  FDA under an Emergency Use Authorization (EUA). This EUA will remain  in effect (meaning this test can be used) for the duration of the  Covid-19 declaration under Section 564(b)(1) of the Act, 21  U.S.C. section 360bbb-3(b)(1), unless the authorization is  terminated or revoked. Performed at [facility], [address] [place], [place] [address]     Blood Alcohol level:  Lab Results  Component Value Date   [facility] <10 01/20/2020   ETH <10 77/93/9030    Metabolic Disorder Labs: No results found for: HGBA1C, MPG No results found for: PROLACTIN No results found for: CHOL, TRIG, HDL, CHOLHDL, VLDL, LDLCALC  Physical Findings: AIMS:  , ,  ,  ,    CIWA:    COWS:     Musculoskeletal: Strength & Muscle Tone: within normal limits Gait & Station:  normal Patient leans: N/A  Psychiatric Specialty Exam: Physical Exam Vitals and nursing note reviewed.  Constitutional:      Appearance: He is well-developed.  HENT:     Head: Normocephalic and atraumatic.  Eyes:     Conjunctiva/sclera: Conjunctivae normal.     Pupils: Pupils are equal, round, and reactive to light.  Cardiovascular:     Heart sounds: Normal heart sounds.  Pulmonary:     Effort: Pulmonary effort is normal.  Abdominal:     Palpations: Abdomen is soft.  Musculoskeletal:        General: Normal range of motion.     Cervical back: Normal range of motion.  Skin:    General: Skin is warm and dry.  Neurological:     General: No focal deficit present.     Mental Status: He is alert.  Psychiatric:        Attention and Perception: Attention normal.        Mood and Affect: Mood is anxious. Affect is angry.        Speech: Speech normal.        Behavior: Behavior normal.        Thought Content: Thought content normal.        Cognition and Memory: Cognition normal.        Judgment: Judgment normal.     Review of Systems  Constitutional: Negative.   HENT: Negative.   Eyes: Negative.   Respiratory: Negative.   Cardiovascular: Negative.   Gastrointestinal: Negative.   Musculoskeletal: Negative.   Skin: Negative.   Neurological: Negative.   Psychiatric/Behavioral: Positive for behavioral problems and dysphoric mood. The patient is nervous/anxious.     Blood pressure (!) 148/84, pulse (!) 49, temperature 97.6 F (36.4 C), temperature source Oral, resp. rate 18, height '6\' 1"'  (1.854 m), weight 97.5 kg, SpO2 96 %.Body mass index is 28.37 kg/m.  General Appearance: Casual  Eye Contact:  Good  Speech:  Clear and Coherent  Volume:  Normal  Mood:  Dysphoric and Irritable  Affect:  Congruent  Thought Process:  Goal Directed  Orientation:  Full (Time, Place, and Person)  Thought Content:  Logical  Suicidal Thoughts:  No  Homicidal Thoughts:  No  Memory:  Immediate;    Fair Recent;   Fair Remote;   Fair  Judgement:  Fair  Insight:  Fair  Psychomotor Activity:  Normal  Concentration:  Concentration: Fair  Recall:  [employer] of Knowledge:  Fair  Language:  Fair  Akathisia:  No  Handed:  Right  AIMS (if indicated):  Assets:  Communication Skills Desire for Improvement Financial Resources/Insurance Physical Health Resilience  ADL's:  Intact  Cognition:  WNL  Sleep:  Number of Hours: 8.5     Treatment Plan Summary: Daily contact with patient to assess and evaluate symptoms and progress in treatment, Medication management and Plan Patient receives support and acknowledgment of his concerns by treatment team.  We have clarified that he does want [place] to look into substance abuse treatment.  Social work will follow-up with him regarding referral to the alcohol and drug abuse treatment center.  No indication for changing medicine at this time.  Encourage group attendance and interaction today.  Alethia Berthold, MD 01/22/2020, 2:25 PM

## 2020-01-22 NOTE — Progress Notes (Signed)
Recreation Therapy Notes  Date: 01/22/2020  Time: 9:30 am   Location: Room 21   Behavioral response: N/A   Intervention Topic: Animal Assisted therapy    Discussion/Intervention: Patient did not attend group.   Clinical Observations/Feedback:  Patient did not attend group.   Maan Zarcone LRT/CTRS        Alexza Norbeck 01/22/2020 12:24 PM

## 2020-01-22 NOTE — Consult Note (Signed)
ORTHOPAEDIC CONSULTATION  REQUESTING PHYSICIAN: Clapacs, [name], MD  Chief Complaint: left hip pain  HPI: Samuel Molina is a 59 y.o. male who complains of left hip pain. Please see H&P and ED notes for details. Denies any numbness, tingling or constitutional symptoms.  Past Medical History:  Diagnosis Date  . GERD (gastroesophageal reflux disease)   . Obesity    Past Surgical History:  Procedure Laterality Date  . ESOPHAGOGASTRODUODENOSCOPY (EGD) WITH PROPOFOL N/A 09/20/2017   Procedure: ESOPHAGOGASTRODUODENOSCOPY (EGD) WITH PROPOFOL;  Surgeon: Lin Landsman, MD;  Location: [place];  Service: Gastroenterology;  Laterality: N/A;  . KNEE SURGERY Right    Social History   Socioeconomic History  . Marital status: Single    Spouse name: Not on file  . Number of children: Not on file  . Years of education: Not on file  . Highest education level: Not on file  Occupational History  . Not on file  Tobacco Use  . Smoking status: Current Every Day Smoker    Packs/day: 0.50    Types: Cigarettes  . Smokeless tobacco: Never Used  Vaping Use  . Vaping Use: Never used  Substance and Sexual Activity  . Alcohol use: Not Currently  . Drug use: Never  . Sexual activity: Not on file  Other Topics Concern  . Not on file  Social History Narrative  . Not on file   Social Determinants of Health   Financial Resource Strain:   . Difficulty of Paying Living Expenses: Not on file  Food Insecurity:   . Worried About Charity fundraiser in the Last Year: Not on file  . Ran Out of Food in the Last Year: Not on file  Transportation Needs:   . Lack of Transportation (Medical): Not on file  . Lack of Transportation (Non-Medical): Not on file  Physical Activity:   . Days of Exercise per Week: Not on file  . Minutes of Exercise per Session: Not on file  Stress:   . Feeling of Stress : Not on file  Social Connections:   . Frequency of Communication with Friends and Family:  Not on file  . Frequency of Social Gatherings with Friends and Family: Not on file  . Attends Religious Services: Not on file  . Active Member of Clubs or Organizations: Not on file  . Attends Archivist Meetings: Not on file  . Marital Status: Not on file   History reviewed. No pertinent family history. Allergies  Allergen Reactions  . Acetaminophen Rash, Itching and Nausea And Vomiting   Prior to Admission medications   Medication Sig Start Date End Date Taking? Authorizing Provider  cyclobenzaprine (FLEXERIL) 5 MG tablet Take 1 tablet (5 mg total) by mouth 3 (three) times daily as needed. Patient not taking: Reported on 01/20/2020 01/04/20   Menshew, Dannielle Karvonen, PA-C  ketorolac (TORADOL) 10 MG tablet Take 1 tablet (10 mg total) by mouth every 8 (eight) hours. Patient not taking: Reported on 01/20/2020 01/04/20   Menshew, Dannielle Karvonen, PA-C  omeprazole (PRILOSEC OTC) 20 MG tablet Take 2 tablets (40 mg total) by mouth 2 (two) times daily before a meal. 09/19/17 01/29/19  Lin Landsman, MD   DG Lumbar Spine 2-3 Views  Result Date: 01/20/2020 CLINICAL DATA:  Left hip pain radiating to left knee. EXAM: LUMBAR SPINE - 2-3 VIEW COMPARISON:  Abdomen 09/04/2017. FINDINGS: Mild scoliosis concave left. Diffuse multilevel degenerative change. No acute bony abnormality. No evidence of fracture. Pelvic calcifications  consistent phleboliths. IMPRESSION: Mild scoliosis concave left. Diffuse multilevel degenerative change. No acute bony abnormality. Electronically Signed   By: Marcello Moores  Register   On: 01/20/2020 10:54   DG Hip Unilat W or Wo Pelvis 2-3 Views Left  Result Date: 01/20/2020 CLINICAL DATA:  Left hip pain. EXAM: DG HIP (WITH OR WITHOUT PELVIS) 2-3V LEFT COMPARISON:  01/01/2020. FINDINGS: Degenerative changes lumbar spine and both hips. No acute bony or joint abnormality. No evidence of fracture or dislocation. Sclerotic changes noted the right femoral head. Avascular necrosis  cannot be excluded. Pelvic calcifications consistent phleboliths. IMPRESSION: 1. Degenerative changes lumbar spine and both hips. No acute abnormality identified. 2. Sclerotic changes noted the right femoral head. Avascular necrosis cannot be excluded. Electronically Signed   By: Marcello Moores  Register   On: 01/20/2020 10:53    Positive ROS: All other systems have been reviewed and were otherwise negative with the exception of those mentioned in the HPI and as above.  Physical Exam: General: Alert, no acute distress Cardiovascular: No pedal edema Respiratory: No cyanosis, no use of accessory musculature GI: No organomegaly, abdomen is soft and non-tender Skin: No lesions in the area of chief complaint Neurologic: Sensation intact distally Psychiatric: Patient is competent for consent with normal mood and affect Lymphatic: No axillary or cervical lymphadenopathy  MUSCULOSKELETAL: tender over greater trochanter, no pain with IR of hip, mild pain with external. +FABER test. Compartments soft. Good cap refill. Motor and sensory intact distally.  Assessment: Left hip trochanteric bursitis  Plan: Recommend outpatient PT, NSAID's, and may benefit from muscle relaxer such as robaxin 3 times a day as needed. He has orthopedic follow-up on October 7 and he was instructed to keep this appointment for further evaluation of his hip pain as an outpatient. Please call with questions.    Lovell Sheehan, MD    01/22/2020 7:34 AM

## 2020-01-22 NOTE — Plan of Care (Signed)
Patient is appropriate with staff & peers.Patient is receptive with staff and  motivated to go to inpatient rehab program and learn coping skills.Denies SI,HI and AVH.Attended groups.Personal hygiene maintained.Compliant with medications.Appetite and energy level good.Support and encouragement given.

## 2020-01-23 MED ORDER — OXYCODONE HCL 5 MG PO TABS: 10.0000 mg | ORAL_TABLET | Freq: Four times a day (QID) | ORAL | Status: DC | PRN

## 2020-01-23 MED ORDER — NEOMYCIN-POLYMYXIN-HC 3.5-10000-1 OT SOLN
3.0000 [drp] | Freq: Three times a day (TID) | OTIC | Status: DC
  Administered 2020-01-23: 3 [drp] via OTIC
  Filled 2020-01-23: qty 10

## 2020-01-23 MED ORDER — MIRTAZAPINE 15 MG PO TABS: 15.0000 mg | ORAL_TABLET | Freq: Every evening | ORAL | Status: DC | PRN

## 2020-01-23 MED ORDER — CITALOPRAM HYDROBROMIDE 10 MG PO TABS
10.0000 mg | ORAL_TABLET | Freq: Every day | ORAL | 0 refills | Status: DC
Start: 2020-01-24 — End: 2020-02-03

## 2020-01-23 MED ORDER — OXYCODONE HCL 10 MG PO TABS
10.0000 mg | ORAL_TABLET | Freq: Four times a day (QID) | ORAL | 0 refills | Status: DC | PRN
Start: 2020-01-23 — End: 2020-01-23

## 2020-01-23 MED ORDER — OXYCODONE HCL 10 MG PO TABS
10.0000 mg | ORAL_TABLET | Freq: Four times a day (QID) | ORAL | 0 refills | Status: DC | PRN
Start: 2020-01-23 — End: 2020-02-03

## 2020-01-23 MED ORDER — CITALOPRAM HYDROBROMIDE 10 MG PO TABS
10.0000 mg | ORAL_TABLET | Freq: Every day | ORAL | 0 refills | Status: DC
Start: 2020-01-24 — End: 2020-01-23

## 2020-01-23 MED ORDER — OXYCODONE HCL 5 MG PO TABS
5.0000 mg | ORAL_TABLET | Freq: Four times a day (QID) | ORAL | Status: DC | PRN
  Administered 2020-01-23: 5 mg via ORAL
  Filled 2020-01-23: qty 1

## 2020-01-23 NOTE — BHH Suicide Risk Assessment (Signed)
[facility] Discharge Suicide Risk Assessment   Principal Problem: Severe recurrent major depression without psychotic features [facility]) Discharge Diagnoses: Principal Problem:   Severe recurrent major depression without psychotic features ([place]) Active Problems:   Major depression   Cocaine abuse ([place])   Hip pain   Total Time spent with patient: 30 minutes  Musculoskeletal: Strength & Muscle Tone: within normal limits Gait & Station: normal Patient leans: N/A  Psychiatric Specialty Exam: Review of Systems  Constitutional: Negative.   HENT: Negative.   Eyes: Negative.   Respiratory: Negative.   Cardiovascular: Negative.   Gastrointestinal: Negative.   Musculoskeletal: Negative.   Skin: Negative.   Neurological: Negative.   Psychiatric/Behavioral: Positive for dysphoric mood. Negative for agitation, behavioral problems, confusion, decreased concentration, hallucinations, self-injury, sleep disturbance and suicidal ideas. The patient is nervous/anxious. The patient is not hyperactive.     Blood pressure 137/85, pulse (!) 57, temperature 97.7 F (36.5 C), temperature source Oral, resp. rate 18, height 6\' 1"  (1.854 m), weight 97.5 kg, SpO2 99 %.Body mass index is 28.37 kg/m.  General Appearance: Casual  Eye Contact::  Good  Speech:  Clear and [id number]  Volume:  Normal  Mood:  Dysphoric  Affect:  Congruent  Thought Process:  Coherent  Orientation:  Full (Time, Place, and Person)  Thought Content:  Logical  Suicidal Thoughts:  No  Homicidal Thoughts:  No  Memory:  Immediate;   Fair Recent;   Fair Remote;   Fair  Judgement:  Fair  Insight:  Fair  Psychomotor Activity:  Normal  Concentration:  Fair  Recall:  [employer] of Knowledge:Fair  Language: Fair  Akathisia:  No  Handed:  Right  AIMS (if indicated):     Assets:  Desire for Improvement Housing Physical Health Resilience  Sleep:  Number of Hours: 6.25  Cognition: WNL  ADL's:  Intact   Mental Status Per Nursing  Assessment::   On Admission:  NA  Demographic Factors:  Male, Divorced or widowed and Living alone  Loss Factors: Loss of significant relationship  Historical Factors: Impulsivity  Risk Reduction Factors:   Employed  Continued Clinical Symptoms:  Depression:   Comorbid alcohol abuse/dependence Alcohol/Substance Abuse/Dependencies  Cognitive Features That Contribute To Risk:  None    Suicide Risk:  Minimal: No identifiable suicidal ideation.  Patients presenting with no risk factors but with morbid ruminations; may be classified as minimal risk based on the severity of the depressive symptoms   Follow-up [place] Follow up.   Specialty: Addiction Medicine Contact information: [place] [address] [place], [facility].   Contact information: [place] [address] [phone]               Plan Of Care/Follow-up recommendations:  Activity:  Activity as tolerated Diet:  Regular diet Other:  Follow-up with outpatient treatment through [place] or through one of the agencies to which you had been referred for inpatient treatment  Alethia Berthold, MD 01/23/2020, 4:07 PM

## 2020-01-23 NOTE — Progress Notes (Signed)
  [facility] Adult Case Management Discharge Plan :  Will you be returning to the same living situation after discharge:  Yes,  pt reports that she is returning home. At discharge, do you have transportation home?: Yes,  pt reports that a friend will pick him up.  Do you have the ability to pay for your medications: No.  Release of information consent forms completed and in the chart;  Patient's signature needed at discharge.  Patient to Follow up at:  Follow-up [place] Follow up.   Specialty: Addiction Medicine Why: Your referral has been sent. Please follow up. Thanks! Contact information: [place] [address] [place], [facility] Follow up.   Why: Your referral has been sent. Please follow up. Thanks! Contact information: [address] [place] [address] [phone]        [facility] Follow up.   Why: Your referral has been sent. Please follow up. Thanks! Contact information: [address] [place], [place] [address] office: [phone] fax: [phone]              Next level of care provider has access to [place] and Suicide Prevention discussed: Yes,  SPE completed with pts sister.  Have you used any form of tobacco in the last 30 days? (Cigarettes, Smokeless Tobacco, Cigars, and/or Pipes): Yes  Has patient been referred to the Quitline?: Patient refused referral  Patient has been referred for addiction treatment: Yes  Rozann Lesches, LCSW 01/23/2020, 4:16 PM

## 2020-01-23 NOTE — Discharge Summary (Signed)
Physician Discharge Summary Note  Patient:  Samuel Molina is an 59 y.o., male MRN:  [id number] DOB:  05/21/1960 Patient phone:  [phone] (home)  Patient address:   [place] [address],  Total Time spent with patient: 30 minutes  Date of Admission:  01/20/2020 Date of Discharge: 01/23/2020  Reason for Admission: Patient was admitted after presenting to the emergency room with complaints of anger anxiety depression and possibly passive suicidal ideation in the context of ongoing anxiety and depression and substance abuse  Principal Problem: Severe recurrent major depression without psychotic features [facility]) Discharge Diagnoses: Principal Problem:   Severe recurrent major depression without psychotic features ([place]) Active Problems:   Major depression   Cocaine abuse ([place])   Hip pain   Past Psychiatric History: Past history of presentations to the emergency room but no prior follow-up with substance abuse or mental health treatment  Past Medical History:  Past Medical History:  Diagnosis Date  . GERD (gastroesophageal reflux disease)   . Obesity     Past Surgical History:  Procedure Laterality Date  . ESOPHAGOGASTRODUODENOSCOPY (EGD) WITH PROPOFOL N/A 09/20/2017   Procedure: ESOPHAGOGASTRODUODENOSCOPY (EGD) WITH PROPOFOL;  Surgeon: Lin Landsman, MD;  Location: [facility];  Service: Gastroenterology;  Laterality: N/A;  . KNEE SURGERY Right    Family History: History reviewed. No pertinent family history. Family Psychiatric  History: Does not report any Social History:  Social History   Substance and Sexual Activity  Alcohol Use Not Currently     Social History   Substance and Sexual Activity  Drug Use Never    Social History   Socioeconomic History  . Marital status: Single    Spouse name: Not on file  . Number of children: Not on file  . Years of education: Not on file  . Highest education level: Not on file  Occupational  History  . Not on file  Tobacco Use  . Smoking status: Current Every Day Smoker    Packs/day: 0.50    Types: Cigarettes  . Smokeless tobacco: Never Used  Vaping Use  . Vaping Use: Never used  Substance and Sexual Activity  . Alcohol use: Not Currently  . Drug use: Never  . Sexual activity: Not on file  Other Topics Concern  . Not on file  Social History Narrative  . Not on file   Social Determinants of Health   Financial Resource Strain:   . Difficulty of Paying Living Expenses: Not on file  Food Insecurity:   . Worried About Charity fundraiser in the Last Year: Not on file  . Ran Out of Food in the Last Year: Not on file  Transportation Needs:   . Lack of Transportation (Medical): Not on file  . Lack of Transportation (Non-Medical): Not on file  Physical Activity:   . Days of Exercise per Week: Not on file  . Minutes of Exercise per Session: Not on file  Stress:   . Feeling of Stress : Not on file  Social Connections:   . Frequency of Communication with Friends and Family: Not on file  . Frequency of Social Gatherings with Friends and Family: Not on file  . Attends Religious Services: Not on file  . Active Member of Clubs or Organizations: Not on file  . Attends Archivist Meetings: Not on file  . Marital Status: Not on file    Hospital Course: Patient was kept on 15-minute checks.  He was engaged in individual  and group counseling and attended the treatment team meeting.  Patient was able to articulate a desire to work on improving his mood especially his mood lability and anger and to get treatment to help him become sober from drug abuse.  He was started on low-dose citalopram which he tolerated.  He had requested inpatient substance abuse referral and those referrals to facilities had been made.  He did complain of hip pain that was worsening during his hospital stay to the point that he requested and received oxycodone pain medication.  He was seen by  orthopedic surgery once and a second consult had been placed.  On the day of discharge he came to me in the afternoon stating that a close friend of his had passed away.  He stated that he felt he needed to be there with the family and also could not missed the funeral.  I empathized with him and gave him room to express what he was going through and then gave him my advice that he stay in the hospital to continue pursuing treatment.  After consideration and acknowledging the risks he stated that he wanted to be discharged.  As the patient is not acutely psychotic suicidal or threatening and is not meeting commitment criteria he will be discharged from the hospital today.  He is to be given information about the inpatient referrals that have been made and also has inpatient for potential follow-up with [facility]  Physical Findings: AIMS:  , ,  ,  ,    CIWA:    COWS:     Musculoskeletal: Strength & Muscle Tone: within normal limits Gait & Station: normal Patient leans: N/A  Psychiatric Specialty Exam: Physical Exam Constitutional:      Appearance: He is well-developed.  HENT:     Head: Normocephalic and atraumatic.  Eyes:     Conjunctiva/sclera: Conjunctivae normal.     Pupils: Pupils are equal, round, and reactive to light.  Cardiovascular:     Heart sounds: Normal heart sounds.  Pulmonary:     Effort: Pulmonary effort is normal.  Abdominal:     Palpations: Abdomen is soft.  Musculoskeletal:        General: Normal range of motion.     Cervical back: Normal range of motion.  Skin:    General: Skin is warm and dry.  Neurological:     General: No focal deficit present.     Mental Status: He is alert.  Psychiatric:        Attention and Perception: Attention normal.        Mood and Affect: Mood is depressed.        Speech: Speech normal.        Behavior: Behavior normal.        Thought Content: Thought content normal.        Cognition and Memory: Cognition normal.        Judgment:  Judgment normal.     Review of Systems  Constitutional: Negative.   HENT: Negative.   Eyes: Negative.   Respiratory: Negative.   Cardiovascular: Negative.   Gastrointestinal: Negative.   Musculoskeletal: Negative.   Skin: Negative.   Neurological: Negative.   Psychiatric/Behavioral: Negative.     Blood pressure 137/85, pulse (!) 57, temperature 97.7 F (36.5 C), temperature source Oral, resp. rate 18, height 6\' 1"  (1.854 m), weight 97.5 kg, SpO2 99 %.Body mass index is 28.37 kg/m.  General Appearance: Casual  Eye Contact:  Good  Speech:  Clear and  Coherent  Volume:  Normal  Mood:  Euthymic  Affect:  Congruent  Thought Process:  Coherent  Orientation:  Full (Time, Place, and Person)  Thought Content:  Logical  Suicidal Thoughts:  No  Homicidal Thoughts:  No  Memory:  Immediate;   Fair Recent;   Fair Remote;   Fair  Judgement:  Fair  Insight:  Fair  Psychomotor Activity:  Normal  Concentration:  Concentration: Fair  Recall:  [employer] of Knowledge:  Fair  Language:  Fair  Akathisia:  No  Handed:  Right  AIMS (if indicated):     Assets:  Desire for Improvement Housing Physical Health Resilience  ADL's:  Intact  Cognition:  WNL  Sleep:  Number of Hours: 6.25     Have you used any form of tobacco in the last 30 days? (Cigarettes, Smokeless Tobacco, Cigars, and/or Pipes): Yes  Has this patient used any form of tobacco in the last 30 days? (Cigarettes, Smokeless Tobacco, Cigars, and/or Pipes) Yes, No  Blood Alcohol level:  Lab Results  Component Value Date   ETH <10 01/20/2020   ETH <10 00/92/3300    Metabolic Disorder Labs:  No results found for: HGBA1C, MPG No results found for: PROLACTIN No results found for: CHOL, TRIG, HDL, CHOLHDL, VLDL, LDLCALC  See Psychiatric Specialty Exam and Suicide Risk Assessment completed by Attending Physician prior to discharge.  Discharge destination:  Home  Is patient on multiple antipsychotic therapies at discharge:   No   Has Patient had three or more failed trials of antipsychotic monotherapy by history:  No  Recommended Plan for Multiple Antipsychotic Therapies: NA  Discharge Instructions    Diet - low sodium heart healthy   Complete by: As directed    Increase activity slowly   Complete by: As directed      Allergies as of 01/23/2020      Reactions   Acetaminophen Rash, Itching, Nausea And Vomiting      Medication List    STOP taking these medications   cyclobenzaprine 5 MG tablet Commonly known as: FLEXERIL   ketorolac 10 MG tablet Commonly known as: TORADOL     TAKE these medications     Indication  citalopram 10 MG tablet Commonly known as: CELEXA Take 1 tablet (10 mg total) by mouth daily. Start taking on: January 24, 2020  Indication: Depression   Oxycodone HCl 10 MG Tabs Take 1 tablet (10 mg total) by mouth every 6 (six) hours as needed for moderate pain.  Indication: Acute Pain       Follow-up [facility] Follow up.   Specialty: Addiction Medicine Why: Your referral has been sent. Please follow up. Thanks! Contact information: [place] [address] [place], [facility] Follow up.   Why: Your referral has been sent. Please follow up. Thanks! Contact information: [address] [place] [address] [phone]        [facility] Follow up.   Why: Your referral has been sent. Please follow up. Thanks! Contact information: [address] [place], [place] [address] office: [phone] fax: [phone]              Follow-up recommendations:  Activity:  Activity as tolerated Diet:  Regular diet Other:  Strongly encouraged follow-up outpatient mental health and substance abuse treatment  Comments: Prescriptions given at discharge.  Patient left to abruptly  to allow for a 7-day supply to be obtained.  Signed: Alethia Berthold, MD 01/23/2020, 4:13 PM

## 2020-01-23 NOTE — Progress Notes (Signed)
D: Pt alert and oriented. Pt rates depression 8/10, hopelessness 7/10, and anxiety 9/10.Pt goal: "Too be pain free." Pt reports energy level as low and concentration as being poor. Pt reports sleep last night as being poor. Pt did receive medications for sleep and did not find them helpful. Pt reports experiencing 10/10 Left hip pain, prn meds given. Pt denies experiencing any SI/HI, or AVH at this time.   A: Scheduled medications administered to pt, per MD orders. Support and encouragement provided. Frequent verbal contact made. Routine safety checks conducted q15 minutes.   R: No adverse drug reactions noted. Pt verbally contracts for safety at this time. Pt complaint with medications. Pt interacts well with others on the unit. Pt remains safe at this time. Will continue to monitor.

## 2020-01-23 NOTE — Progress Notes (Signed)
Patient alert and oriented x 4 affect is blunted thoughts are organized and coherent he appears less irritable, not argumentative with staff, he was receptive to staff, he was given medication for left hip pain. Patients was offered  emotional support and encouragement, 15 minutes safety checks maintained will continue to monitor 
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
## 2020-01-23 NOTE — B[facility] Counselor (Signed)
CSW notes that she provided the patient with a print out of [place] for [facility] and [employer].   Assunta Curtis, MSW, LCSW 01/23/2020 4:15 PM

## 2020-01-23 NOTE — Progress Notes (Signed)
D: Pt alert and oriented. Pt denies experiencing any SI/HI, or AVH at this time. Pt reports he will be able to keep himself safe when he returns home.   A: Pt received discharge and medication education/information. Pt belongings were returned and confirmed to all be present upon discharge and received his printed prescriptions.   R: Pt verbalized understanding of discharge and medication education/information.  Pt escorted by staff to the medical mall front lobby where pt's friend picked him up.

## 2020-01-23 NOTE — B[facility] Counselor (Signed)
CSW sent referral to [place], MSW, LCSW 01/23/2020 2:39 PM

## 2020-01-28 ENCOUNTER — Other Ambulatory Visit: Payer: Self-pay

## 2020-01-28 ENCOUNTER — Emergency Department
Admission: EM | Admit: 2020-01-28 | Discharge: 2020-01-29 | Disposition: A | Discharge status: Home / Self Care | Payer: Medicaid Other | Attending: Emergency Medicine | Admitting: Emergency Medicine

## 2020-01-28 DIAGNOSIS — G8929 Other chronic pain: Secondary | ICD-10-CM | POA: Insufficient documentation

## 2020-01-28 DIAGNOSIS — K2100 Gastro-esophageal reflux disease with esophagitis, without bleeding: Secondary | ICD-10-CM | POA: Diagnosis present

## 2020-01-28 DIAGNOSIS — F332 Major depressive disorder, recurrent severe without psychotic features: Secondary | ICD-10-CM | POA: Diagnosis present

## 2020-01-28 DIAGNOSIS — M25552 Pain in left hip: Secondary | ICD-10-CM | POA: Insufficient documentation

## 2020-01-28 DIAGNOSIS — F1410 Cocaine abuse, uncomplicated: Secondary | ICD-10-CM | POA: Diagnosis present

## 2020-01-28 DIAGNOSIS — F32A Depression, unspecified: Secondary | ICD-10-CM | POA: Insufficient documentation

## 2020-01-28 DIAGNOSIS — Z20822 Contact with and (suspected) exposure to covid-19: Secondary | ICD-10-CM | POA: Insufficient documentation

## 2020-01-28 DIAGNOSIS — M25559 Pain in unspecified hip: Secondary | ICD-10-CM | POA: Diagnosis present

## 2020-01-28 DIAGNOSIS — F329 Major depressive disorder, single episode, unspecified: Secondary | ICD-10-CM | POA: Diagnosis present

## 2020-01-28 LAB — COMPREHENSIVE METABOLIC PANEL
ALT: 17 U/L (ref 0–44)
AST: 20 U/L (ref 15–41)
Albumin: 4.1 g/dL (ref 3.5–5.0)
Alkaline Phosphatase: 99 U/L (ref 38–126)
Anion gap: 10 (ref 5–15)
BUN: 14 mg/dL (ref 6–20)
CO2: 27 mmol/L (ref 22–32)
Calcium: 9.4 mg/dL (ref 8.9–10.3)
Chloride: 104 mmol/L (ref 98–111)
Creatinine, Ser: 0.98 mg/dL (ref 0.61–1.24)
GFR calc non Af Amer: >60 mL/min (ref >60)
Glucose, Bld: 71 mg/dL (ref 70–99)
Potassium: 4.1 mmol/L (ref 3.5–5.1)
Sodium: 141 mmol/L (ref 135–145)
Total Bilirubin: 0.9 mg/dL (ref 0.3–1.2)
Total Protein: 8 g/dL (ref 6.5–8.1)

## 2020-01-28 LAB — URINE DRUG SCREEN, QUALITATIVE (ARMC ONLY)
Amphetamines, Ur Screen: NOT DETECTED
Barbiturates, Ur Screen: NOT DETECTED
Benzodiazepine, Ur Scrn: NOT DETECTED
Cannabinoid 50 Ng, Ur ~~LOC~~: NOT DETECTED
Cocaine Metabolite,Ur ~~LOC~~: POSITIVE — AB
MDMA (Ecstasy)Ur Screen: NOT DETECTED
Methadone Scn, Ur: NOT DETECTED
Opiate, Ur Screen: NOT DETECTED
Phencyclidine (PCP) Ur S: NOT DETECTED
Tricyclic, Ur Screen: NOT DETECTED

## 2020-01-28 LAB — CBC
HCT: 48 % (ref 39.0–52.0)
Hemoglobin: 15.4 g/dL (ref 13.0–17.0)
MCH: 26.6 pg (ref 26.0–34.0)
MCHC: 32.1 g/dL (ref 30.0–36.0)
MCV: 83 fL (ref 80.0–100.0)
Platelets: 284 10*3/uL (ref 150–400)
RBC: 5.78 MIL/uL (ref 4.22–5.81)
RDW: 14.5 % (ref 11.5–15.5)
WBC: 10.7 10*3/uL — ABNORMAL HIGH (ref 4.0–10.5)
nRBC: 0 % (ref 0.0–0.2)

## 2020-01-28 LAB — RESPIRATORY PANEL BY RT PCR (FLU A&B, COVID)
Influenza A by PCR: NEGATIVE
Influenza B by PCR: NEGATIVE
SARS Coronavirus 2 by RT PCR: NEGATIVE

## 2020-01-28 LAB — ETHANOL: Alcohol, Ethyl (B): <10 mg/dL (ref <10)

## 2020-01-28 LAB — SALICYLATE LEVEL: Salicylate Lvl: <7 mg/dL — ABNORMAL LOW (ref 7.0–30.0)

## 2020-01-28 LAB — ACETAMINOPHEN LEVEL: Acetaminophen (Tylenol), Serum: <10 ug/mL — ABNORMAL LOW (ref 10–30)

## 2020-01-28 MED ORDER — FAMOTIDINE 20 MG PO TABS
20.0000 mg | ORAL_TABLET | Freq: Two times a day (BID) | ORAL | Status: DC
  Administered 2020-01-28 – 2020-01-29 (×2): 20 mg via ORAL
  Filled 2020-01-28 (×2): qty 1

## 2020-01-28 MED ORDER — ALUM & MAG HYDROXIDE-SIMETH 200-200-20 MG/5ML PO SUSP
30.0000 mL | ORAL | Status: DC | PRN
  Administered 2020-01-28: 30 mL via ORAL
  Filled 2020-01-28 (×2): qty 30

## 2020-01-28 MED ORDER — DICLOFENAC SODIUM 1 % EX GEL
4.0000 g | Freq: Four times a day (QID) | CUTANEOUS | Status: DC
  Administered 2020-01-28 – 2020-01-29 (×2): 4 g via TOPICAL
  Filled 2020-01-28: qty 100

## 2020-01-28 MED ORDER — CAPSAICIN 0.025 % EX CREA
TOPICAL_CREAM | Freq: Two times a day (BID) | CUTANEOUS | Status: DC
  Filled 2020-01-28: qty 60

## 2020-01-28 MED ORDER — OXYCODONE HCL 5 MG PO TABS
10.0000 mg | ORAL_TABLET | Freq: Four times a day (QID) | ORAL | Status: DC | PRN
  Administered 2020-01-28 – 2020-01-29 (×3): 10 mg via ORAL
  Filled 2020-01-28 (×3): qty 2

## 2020-01-28 NOTE — ED Notes (Signed)
Gave pt extra food tray.
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
## 2020-01-28 NOTE — BH Assessment (Signed)
Assessment Note  Samuel Molina is an 59 y.o. male who presents to the ER due increase symptoms of depression because of his drug use. Patient reports, he was recently inpatient with [facility] but left prematurely because a good friend died unexpectedly. They were friends since middle school and he was close with the wife as well. He went to be with the family and his friends to grieve. However, since he discharged his drug has increased, alone with feelings of worthlessness, helplessness and hopelessness. He is having thoughts of dying, as in, not wanting to continue to live in his current state. However, he has no desire and intentions to end his life or harm his self. He is unable to sleep, having racing thoughts and irritable.  During the interview, the patient was calm, cooperative and pleasant. He was able to provide appropriate answers to the questions. He denies involvement with the legal system. He has no history violence or aggression.   Diagnosis: Major Depression  Past Medical History:  Past Medical History:  Diagnosis Date  . GERD (gastroesophageal reflux disease)   . Obesity     Past Surgical History:  Procedure Laterality Date  . ESOPHAGOGASTRODUODENOSCOPY (EGD) WITH PROPOFOL N/A 09/20/2017   Procedure: ESOPHAGOGASTRODUODENOSCOPY (EGD) WITH PROPOFOL;  Surgeon: Lin Landsman, MD;  Location: [place];  Service: Gastroenterology;  Laterality: N/A;  . KNEE SURGERY Right     Family History: History reviewed. No pertinent family history.  Social History:  reports that he has been smoking cigarettes. He has been smoking about 0.50 packs per day. He has never used smokeless tobacco. He reports previous alcohol use. He reports that he does not use drugs.  Additional Social History:  Alcohol / Drug Use Pain Medications: See PTA Prescriptions: See PTA Over the Counter: See PTA History of alcohol / drug use?: Yes Substance #1 Name of Substance 1: Cocaine 1 - Last Use  / Amount: 01/27/2020 Substance #2 Name of Substance 2: Alcohol 2 - Last Use / Amount: 01/27/2020 Substance #3 Name of Substance 3: Cannabis 3 - Last Use / Amount: 01/2020  CIWA: CIWA-Ar BP: 106/67 Pulse Rate: 63 COWS:    Allergies:  Allergies  Allergen Reactions  . Acetaminophen Rash, Itching and Nausea And Vomiting    Home Medications: (Not in a hospital admission)   OB/GYN Status:  No LMP for male patient.  General Assessment Data Location of Assessment: [facility] ED TTS Assessment: In system Is this a Tele or Face-to-Face Assessment?: Face-to-Face Is this an Initial Assessment or a Re-assessment for this encounter?: Initial Assessment Patient Accompanied by:: N/A Language Other than English: No Living Arrangements: Other (Comment) (Private Home) What gender do you identify as?: Male Date Telepsych consult ordered in [facility]: 01/28/20 Time Telepsych consult ordered in [facility]: 1541 Marital status: Single Pregnancy Status: No Living Arrangements: Spouse/significant other Can pt return to current living arrangement?: Yes Admission Status: Voluntary Petitioner: Other Is patient capable of signing voluntary admission?: Yes Referral Source: Self/Family/Friend Insurance type: None  Medical Screening Exam ([place]) Medical Exam completed: Yes  Crisis Care Plan Living Arrangements: Spouse/significant other Legal Guardian: Other: (Self) Name of Psychiatrist: None Name of Therapist: None  Education Status Is patient currently in school?: No Highest grade of school patient has completed: Bachelors Is the patient employed, unemployed or receiving disability?: Unemployed  Risk to self with the past 6 months Suicidal Ideation: No Has patient been a risk to self within the past 6 months prior to admission? : No Suicidal  Intent: No Has patient had any suicidal intent within the past 6 months prior to admission? : No Is patient at risk for suicide?: No Suicidal Plan?:  No Has patient had any suicidal plan within the past 6 months prior to admission? : No Access to Means: No What has been your use of drugs/alcohol within the last 12 months?: Cocaine, alcohol, cannabis and alcohol Previous Attempts/Gestures: No How many times?: 0 Other Self Harm Risks: Active drug abuse Triggers for Past Attempts: None known Intentional Self Injurious Behavior: None Family Suicide History: No Recent stressful life event(s): Loss (Comment), Other (Comment) Persecutory voices/beliefs?: No Depression: Yes Depression Symptoms: Feeling worthless/self pity, Loss of interest in usual pleasures, Fatigue, Guilt, Isolating, Tearfulness Substance abuse history and/or treatment for substance abuse?: Yes Suicide prevention information given to non-admitted patients: Not applicable  Risk to Others within the past 6 months Homicidal Ideation: No Does patient have any lifetime risk of violence toward others beyond the six months prior to admission? : No Thoughts of Harm to Others: No Current Homicidal Intent: No Current Homicidal Plan: No Access to Homicidal Means: No Identified Victim: Reports of none History of harm to others?: No Assessment of Violence: None Noted Violent Behavior Description: Reports of none Does patient have access to weapons?: No Criminal Charges Pending?: No Does patient have a court date: No Is patient on probation?: No  Psychosis Hallucinations: None noted Delusions: None noted  Mental Status Report Appearance/Hygiene: Unremarkable, In scrubs Eye Contact: Good Motor Activity: Freedom of movement, Unremarkable Speech: Logical/coherent, Unremarkable Level of Consciousness: Alert Mood: Anxious, Depressed, Guilty, Sad, Pleasant Affect: Anxious, Depressed, Sad Anxiety Level: Minimal Thought Processes: Coherent, Relevant Judgement: Unimpaired Orientation: Person, Place, Time, Situation, Appropriate for developmental age Obsessive Compulsive  Thoughts/Behaviors: None  Cognitive Functioning Concentration: Normal Memory: Recent Intact, Remote Intact Is patient IDD: No Insight: Fair Impulse Control: Fair Appetite: Fair Have you had any weight changes? : No Change Sleep: Decreased Total Hours of Sleep: 3 Vegetative Symptoms: None  ADLScreening [facility] Assessment Services) Patient's cognitive ability adequate to safely complete daily activities?: Yes Patient able to express need for assistance with ADLs?: Yes Independently performs ADLs?: Yes (appropriate for developmental age)  Prior Inpatient Therapy Prior Inpatient Therapy: Yes Prior Therapy Dates: 12/2019 Prior Therapy Facilty/Provider(s): [facility] Reason for Treatment: Major Depression  Prior Outpatient Therapy Prior Outpatient Therapy: No Does patient have an ACCT team?: No Does patient have Intensive In-House Services?  : No Does patient have [facility] services? : No Does patient have P4CC services?: No  ADL Screening (condition at time of admission) Patient's cognitive ability adequate to safely complete daily activities?: Yes Is the patient deaf or have difficulty hearing?: No Does the patient have difficulty seeing, even when wearing glasses/contacts?: No Does the patient have difficulty concentrating, remembering, or making decisions?: No Patient able to express need for assistance with ADLs?: Yes Does the patient have difficulty dressing or bathing?: No Independently performs ADLs?: Yes (appropriate for developmental age) Does the patient have difficulty walking or climbing stairs?: No Weakness of Legs: None Weakness of Arms/Hands: None  Home Assistive Devices/Equipment Home Assistive Devices/Equipment: None  Therapy Consults (therapy consults require a physician order) PT Evaluation Needed: No OT Evalulation Needed: No SLP Evaluation Needed: No Abuse/Neglect Assessment (Assessment to be complete while patient is alone) Abuse/Neglect Assessment Can Be  Completed: Yes Physical Abuse: Denies Verbal Abuse: Denies Sexual Abuse: Denies Exploitation of patient/patient's resources: Denies Self-Neglect: Denies Values / Beliefs Cultural Requests During Hospitalization: None Spiritual Requests During  Hospitalization: None Consults Spiritual Care Consult Needed: No Transition of Care Team Consult Needed: No Advance Directives (For Healthcare) Does Patient Have a Medical Advance Directive?: No  Disposition:  Disposition Initial Assessment Completed for this Encounter: Yes  On Site Evaluation by:   Reviewed with Physician:    Gunnar Fusi MS, LCAS, [facility], [place] Therapeutic Triage Specialist 01/28/2020 6:53 PM

## 2020-01-28 NOTE — ED Notes (Signed)
Belongings include two black shoes, two black socks, one pair pants, one pair gray underwear, one shirt, one flannel, one black belt, one red phone turned off, one pair glasses staying with patient, wallet, keys, cigarettes. 1/1 belongings bag.

## 2020-01-28 NOTE — ED Provider Notes (Signed)
[facility] Emergency Department Provider Note  ____________________________________________  Time seen: Approximately 4:03 PM  I have reviewed the triage vital signs and the nursing notes.   HISTORY  Chief Complaint Psychiatric Evaluation    HPI Samuel Molina is a 59 y.o. male with a history of GERD, cocaine abuse, depression, obesity  who comes the ED complaining of persistent feeling of hopelessness and depressed mood in the setting of recurrent crack cocaine use, most recently 2 days ago, and associated with a passive feeling of suicidality as well as a desire to hurt other people with whom he does not get along.  Denies hallucinations, no self-injurious behaviors so far.  Symptoms of hopelessness, depressed mood, and aggressiveness are constant, waxing waning, no aggravating or alleviating factors related the patient.  He was recently admitted to psychiatry, left abruptly 5 days ago reporting that a friend had died and he needed to go to the funeral of the there for his friends.  He had been planned for placement in inpatient rehab.  Denies any other drug use.  Also complains of persistent severe left hip pain that is worse with walking.  He was receiving oxycodone 10 mg while he was in the psychiatry floor, and received a limited prescription upon discharge which she reports has run out.  While he was on the psychiatry floor, orthopedics consulted on the patient, diagnosing left hip trochanteric bursitis, recommending outpatient PT, NSAIDs, and trial of Robaxin 3 times daily as needed.  He reports being compliant with citalopram 10 mg daily that was started while in psychiatric floor.     Past Medical History:  Diagnosis Date  . GERD (gastroesophageal reflux disease)   . Obesity      Patient Active Problem List   Diagnosis Date Noted  . Severe recurrent major depression without psychotic features ([place]) 01/21/2020  . Cocaine abuse ([place])  01/21/2020  . Hip pain 01/21/2020  . Major depression 01/20/2020  . Gastroesophageal reflux disease with esophagitis   . Morbid obesity ([place]) 09/19/2017     Past Surgical History:  Procedure Laterality Date  . ESOPHAGOGASTRODUODENOSCOPY (EGD) WITH PROPOFOL N/A 09/20/2017   Procedure: ESOPHAGOGASTRODUODENOSCOPY (EGD) WITH PROPOFOL;  Surgeon: Lin Landsman, MD;  Location: [place];  Service: Gastroenterology;  Laterality: N/A;  . KNEE SURGERY Right      Prior to Admission medications   Medication Sig Start Date End Date Taking? Authorizing Provider  citalopram (CELEXA) 10 MG tablet Take 1 tablet (10 mg total) by mouth daily. 01/24/20   Clapacs, [name], MD  Oxycodone HCl 10 MG TABS Take 1 tablet (10 mg total) by mouth every 6 (six) hours as needed. 01/23/20   Clapacs, [name], MD  omeprazole (PRILOSEC OTC) 20 MG tablet Take 2 tablets (40 mg total) by mouth 2 (two) times daily before a meal. 09/19/17 01/29/19  Lin Landsman, MD     Allergies Acetaminophen   History reviewed. No pertinent family history.  Social History Social History   Tobacco Use  . Smoking status: Current Every Day Smoker    Packs/day: 0.50    Types: Cigarettes  . Smokeless tobacco: Never Used  Vaping Use  . Vaping Use: Never used  Substance Use Topics  . Alcohol use: Not Currently  . Drug use: Never    Review of Systems  Constitutional:   No fever or chills.  ENT:   No sore throat. No rhinorrhea. Cardiovascular:   No chest pain or syncope. Respiratory:   No dyspnea  or cough. Gastrointestinal:   Negative for abdominal pain, vomiting and diarrhea.  Musculoskeletal:   Chronic left hip pain All other systems reviewed and are negative except as documented above in ROS and HPI.  ____________________________________________   PHYSICAL EXAM:  VITAL SIGNS: ED Triage Vitals  Enc Vitals Group     BP 01/28/20 1504 (!) 139/54     Pulse Rate 01/28/20 1504 72     Resp 01/28/20 1512 16      Temp 01/28/20 1504 98.7 F (37.1 C)     Temp Source 01/28/20 1504 Oral     SpO2 01/28/20 1504 100 %     Weight 01/28/20 1518 216 lb 0.8 oz (98 kg)     Height 01/28/20 1518 6\' 1"  (1.854 m)     Head Circumference --      Peak Flow --      Pain Score 01/28/20 1518 8     Pain Loc --      Pain Edu? --      Excl. in [place]? --     Vital signs reviewed, nursing assessments reviewed.   Constitutional:   Alert and oriented. Non-toxic appearance. Eyes:   Conjunctivae are normal. EOMI. PERRL. ENT      Head:   Normocephalic and atraumatic.      Nose:   Wearing a mask.      Mouth/Throat:   Wearing a mask.      Neck:   No meningismus. Full ROM. Hematological/Lymphatic/Immunilogical:   No cervical lymphadenopathy. Cardiovascular:   RRR. Symmetric bilateral radial and DP pulses.  No murmurs. Cap refill less than 2 seconds. Respiratory:   Normal respiratory effort without tachypnea/retractions. Breath sounds are clear and equal bilaterally. No wheezes/rales/rhonchi. Gastrointestinal:   Soft and nontender. Non distended. There is no CVA tenderness.  No rebound, rigidity, or guarding.  Musculoskeletal:   Normal range of motion in all extremities. No joint effusions.  Diffuse tenderness about the left hip without inflammatory changes.  No edema. Neurologic:   Normal speech and language.  Motor grossly intact. No acute focal neurologic deficits are appreciated.  Skin:    Skin is warm, dry and intact. No rash noted.  No petechiae, purpura, or bullae.  ____________________________________________    LABS (pertinent positives/negatives) (all labs ordered are listed, but only abnormal results are displayed) Labs Reviewed  SALICYLATE LEVEL - Abnormal; Notable for the following components:      Result Value   Salicylate Lvl <4.2 (*)    All other components within normal limits  ACETAMINOPHEN LEVEL - Abnormal; Notable for the following components:   Acetaminophen (Tylenol), Serum <10 (*)    All other  components within normal limits  CBC - Abnormal; Notable for the following components:   WBC 10.7 (*)    All other components within normal limits  COMPREHENSIVE METABOLIC PANEL  ETHANOL  URINE DRUG SCREEN, QUALITATIVE ([facility] ONLY)   ____________________________________________   EKG    ____________________________________________    RADIOLOGY  No results found.  ____________________________________________   PROCEDURES Procedures  ____________________________________________    CLINICAL IMPRESSION / ASSESSMENT AND PLAN / ED COURSE  Medications ordered in the ED: Medications  oxyCODONE (Oxy IR/ROXICODONE) immediate release tablet 10 mg (has no administration in time range)  diclofenac Sodium (VOLTAREN) 1 % topical gel 4 g (has no administration in time range)  capsaicin (ZOSTRIX) 0.025 % cream (has no administration in time range)    Pertinent labs & imaging results that were available during my care of the  patient were reviewed by me and considered in my medical decision making (see chart for details).  Samuel Molina was evaluated in Emergency Department on 01/28/2020 for the symptoms described in the history of present illness. He was evaluated in the context of the global COVID-19 pandemic, which necessitated consideration that the patient might be at risk for infection with the SARS-CoV-2 virus that causes COVID-19. Institutional protocols and algorithms that pertain to the evaluation of patients at risk for COVID-19 are in a state of rapid change based on information released by regulatory bodies including the CDC and federal and state organizations. These policies and algorithms were followed during the patient's care in the ED.   Patient presents with feeling of hopelessness and depression in the setting of continued crack cocaine abuse, and chronic left hip pain.  Also hiccuping and reporting abdominal discomfort related to GERD.  - psych consult. Voluntary  for now  Hip pain: topical voltaren, capsaicin. Oxycodone x 1. Will transition to PO NSAIDS if ok with psych from med interaction perspective  The patient has been placed in psychiatric observation due to the need to provide a safe environment for the patient while obtaining psychiatric consultation and evaluation, as well as ongoing medical and medication management to treat the patient's condition.  The patient has not been placed under full IVC at this time.       ____________________________________________   FINAL CLINICAL IMPRESSION(S) / ED DIAGNOSES    Final diagnoses:  Cocaine abuse ([place])  Chronic left hip pain     ED Discharge Orders    None      Portions of this note were generated with dragon dictation software. Dictation errors may occur despite best attempts at proofreading.   Carrie Mew, MD 01/28/20 1729

## 2020-01-28 NOTE — ED Notes (Signed)
Pt. Transferred to [place] from ED to room 3 after screening for contraband. Report to include Situation, Background, Assessment and Recommendations from [employer]. Pt. Oriented to unit including Q15 minute rounds as well as the security cameras for their protection. Patient is alert and oriented, warm and dry in no acute distress. Patient denies SI, HI, and AVH. Pt. Encouraged to let me know if needs arise.

## 2020-01-28 NOTE — ED Triage Notes (Signed)
Pt comes pov with left hip pain and psych issues. Pt states that he signed himself out AMA last week because his friend was in a bad wreck and died. Pt then went to hang out with friends who "are a bad influence" and he did some crack. Pt states he feels hopeless and want something for pain for his hip.

## 2020-01-28 NOTE — ED Notes (Signed)
Food tray was given with juice. 

## 2020-01-29 ENCOUNTER — Encounter: Payer: Self-pay | Admitting: Psychiatry

## 2020-01-29 ENCOUNTER — Inpatient Hospital Stay
Admission: AD | Admit: 2020-01-29 | Discharge: 2020-02-03 | DRG: 885 | Disposition: A | Discharge status: Home / Self Care | Payer: No Typology Code available for payment source | Source: Intra-hospital | Attending: Psychiatry | Admitting: Psychiatry

## 2020-01-29 DIAGNOSIS — Z79899 Other long term (current) drug therapy: Secondary | ICD-10-CM | POA: Diagnosis not present

## 2020-01-29 DIAGNOSIS — R45851 Suicidal ideations: Secondary | ICD-10-CM | POA: Diagnosis present

## 2020-01-29 DIAGNOSIS — R066 Hiccough: Secondary | ICD-10-CM | POA: Diagnosis present

## 2020-01-29 DIAGNOSIS — H9201 Otalgia, right ear: Secondary | ICD-10-CM | POA: Diagnosis present

## 2020-01-29 DIAGNOSIS — Z20822 Contact with and (suspected) exposure to covid-19: Secondary | ICD-10-CM | POA: Diagnosis present

## 2020-01-29 DIAGNOSIS — F1410 Cocaine abuse, uncomplicated: Secondary | ICD-10-CM | POA: Diagnosis present

## 2020-01-29 DIAGNOSIS — K2100 Gastro-esophageal reflux disease with esophagitis, without bleeding: Secondary | ICD-10-CM | POA: Diagnosis present

## 2020-01-29 DIAGNOSIS — F17210 Nicotine dependence, cigarettes, uncomplicated: Secondary | ICD-10-CM | POA: Diagnosis present

## 2020-01-29 DIAGNOSIS — Z6827 Body mass index (BMI) 27.0-27.9, adult: Secondary | ICD-10-CM | POA: Diagnosis not present

## 2020-01-29 DIAGNOSIS — E669 Obesity, unspecified: Secondary | ICD-10-CM | POA: Diagnosis present

## 2020-01-29 DIAGNOSIS — M25559 Pain in unspecified hip: Secondary | ICD-10-CM | POA: Diagnosis present

## 2020-01-29 DIAGNOSIS — F332 Major depressive disorder, recurrent severe without psychotic features: Principal | ICD-10-CM | POA: Diagnosis present

## 2020-01-29 MED ORDER — PANTOPRAZOLE SODIUM 40 MG PO TBEC
40.0000 mg | DELAYED_RELEASE_TABLET | Freq: Every day | ORAL | Status: DC
  Administered 2020-01-29 – 2020-02-02 (×5): 40 mg via ORAL
  Filled 2020-01-29 (×5): qty 1

## 2020-01-29 MED ORDER — PANTOPRAZOLE SODIUM 20 MG PO TBEC
20.0000 mg | DELAYED_RELEASE_TABLET | Freq: Four times a day (QID) | ORAL | Status: DC | PRN
  Filled 2020-01-29: qty 1

## 2020-01-29 MED ORDER — ALUM & MAG HYDROXIDE-SIMETH 200-200-20 MG/5ML PO SUSP: 30.0000 mL | ORAL | Status: DC | PRN

## 2020-01-29 MED ORDER — MAGNESIUM HYDROXIDE 400 MG/5ML PO SUSP: 30.0000 mL | Freq: Every day | ORAL | Status: DC | PRN

## 2020-01-29 MED ORDER — FAMOTIDINE 20 MG PO TABS: 20.0000 mg | ORAL_TABLET | Freq: Two times a day (BID) | ORAL | Status: DC | PRN

## 2020-01-29 MED ORDER — CITALOPRAM HYDROBROMIDE 20 MG PO TABS
10.0000 mg | ORAL_TABLET | Freq: Every day | ORAL | Status: DC
  Administered 2020-01-29 – 2020-02-03 (×6): 10 mg via ORAL
  Filled 2020-01-29 (×6): qty 1

## 2020-01-29 MED ORDER — ACETAMINOPHEN 325 MG PO TABS: 650.0000 mg | ORAL_TABLET | Freq: Four times a day (QID) | ORAL | Status: DC | PRN

## 2020-01-29 MED ORDER — CALCIUM CARBONATE ANTACID 500 MG PO CHEW
1.0000 | CHEWABLE_TABLET | Freq: Three times a day (TID) | ORAL | Status: DC | PRN
  Administered 2020-01-29: 200 mg via ORAL
  Filled 2020-01-29: qty 1

## 2020-01-29 MED ORDER — MIRTAZAPINE 15 MG PO TABS
15.0000 mg | ORAL_TABLET | Freq: Every evening | ORAL | Status: DC | PRN
  Administered 2020-01-29 – 2020-01-31 (×3): 15 mg via ORAL
  Filled 2020-01-29 (×3): qty 1

## 2020-01-29 MED ORDER — NAPROXEN 500 MG PO TABS
500.0000 mg | ORAL_TABLET | Freq: Two times a day (BID) | ORAL | Status: DC
  Administered 2020-01-29 – 2020-02-03 (×11): 500 mg via ORAL
  Filled 2020-01-29 (×11): qty 1

## 2020-01-29 NOTE — Tx Team (Signed)
Initial Treatment Plan 01/29/2020 4:31 PM [name] JME:[id number]    PATIENT STRESSORS: Financial difficulties Substance abuse   PATIENT STRENGTHS: Average or above average intelligence Capable of independent living Communication skills Motivation for treatment/growth   PATIENT IDENTIFIED PROBLEMS: Depression  Anxiety                   DISCHARGE CRITERIA:  Ability to meet basic life and health needs Medical problems require only outpatient monitoring Safe-care adequate arrangements made Verbal commitment to aftercare and medication compliance  PRELIMINARY DISCHARGE PLAN: Attend aftercare/continuing care group Return to previous living arrangement Return to previous work or school arrangements  PATIENT/FAMILY INVOLVEMENT: This treatment plan has been presented to and reviewed with the patient, [name], and/or family member,  The patient and family have been given the opportunity to ask questions and make suggestions.  Merlene Morse, RN 01/29/2020, 4:31 PM

## 2020-01-29 NOTE — Plan of Care (Signed)
Patient new to the unit today, hasn't had time to progress.   Problem: Education: Goal: Knowledge of [place] [facility] Education information/materials will improve Outcome: Not Progressing   Problem: Health Behavior/Discharge Planning: Goal: Identification of resources available to assist in meeting health care needs will improve Outcome: Not Progressing Goal: Compliance with treatment plan for underlying cause of condition will improve Outcome: Not Progressing   Problem: Education: Goal: Utilization of techniques to improve thought processes will improve Outcome: Not Progressing Goal: Knowledge of the prescribed therapeutic regimen will improve Outcome: Not Progressing   Problem: Coping: Goal: Coping ability will improve Outcome: Not Progressing Goal: Will verbalize feelings Outcome: Not Progressing   Problem: Safety: Goal: Ability to disclose and discuss suicidal ideas will improve Outcome: Not Progressing Goal: Ability to identify and utilize support systems that promote safety will improve Outcome: Not Progressing   Problem: Self-Concept: Goal: Will verbalize positive feelings about self Outcome: Not Progressing Goal: Level of anxiety will decrease Outcome: Not Progressing

## 2020-01-29 NOTE — Progress Notes (Signed)
Patient calm and pleasant during assessment. Patient endorses anxiety, depression and passive SI with no plan. Patient given support, encouragement and education. Patient compliant with medication administration per MD orders. Patient observed interacting appropriately with staff and peers on the unit. Patient being monitored Q 15 minutes for safety per unit protocol. Pt remains safe on the unit.

## 2020-01-29 NOTE — ED Notes (Signed)
Patient has acid reflux, He said pepcid does not help him, Nurse ask him about protonix and He said that it helped the most, Patient keeps belching non-stop, nurse let him know she would talk to Doctor to change order to protonix.

## 2020-01-29 NOTE — ED Notes (Signed)

## 2020-01-29 NOTE — BH Assessment (Signed)
Patient is to be admitted to [facility] by [name].  Attending Physician will be [name].   Patient has been assigned to room 304, by [place].   ER staff is aware of the admission:  Anne Ng, ER Secretary    Dr. Cinda Quest,, ER MD   Donneta Romberg, Patient's Nurse   Tho Patient Access.

## 2020-01-29 NOTE — ED Notes (Signed)
Patient ate 100% of breakfast and beverage, He is alert and oriented, no signs of distress, no behavioral issues, will continue to monitor.

## 2020-01-29 NOTE — Progress Notes (Signed)
Samuel Molina is an 59 y.o. male who presents to the ER due increase symptoms of depression because of his drug use. Patient reports, he was recently inpatient with [facility] but left prematurely because a good friend died unexpectedly. They were friends since middle school and he was close with the wife as well. He went to be with the family and his friends to grieve. However, since he discharged his drug has increased, alone with feelings of worthlessness, helplessness and hopelessness. He is having thoughts of dying, as in, not wanting to continue to live in his current state. However, he has no desire and intentions to end his life or harm his self. He is unable to sleep, having racing thoughts and irritable.  Pt is calm and cooperative during assessment. Skin assessment preformed and is appears to have no injuries and is intact.   Pt currently denies experiencing SI/HI/AVH.
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
## 2020-01-29 NOTE — Tx Team (Signed)
Initial Treatment Plan 01/29/2020 5:24 PM Arliss Frisina BOM:[id number]    PATIENT STRESSORS: Loss of friend who recently passed away Substance abuse   PATIENT STRENGTHS: Communication skills Motivation for treatment/growth   PATIENT IDENTIFIED PROBLEMS: Substance abuse  Ineffective Coping skills                   DISCHARGE CRITERIA:  Improved stabilization in mood, thinking, and/or behavior Motivation to continue treatment in a less acute level of care  PRELIMINARY DISCHARGE PLAN: Attend 12-step recovery group Outpatient therapy  PATIENT/FAMILY INVOLVEMENT: This treatment plan has been presented to and reviewed with the patient, Samuel Molina, and/or family member.  The patient and family have been given the opportunity to ask questions and make suggestions.  Aleen Sells, RN 01/29/2020, 5:24 PM

## 2020-01-30 DIAGNOSIS — F332 Major depressive disorder, recurrent severe without psychotic features: Secondary | ICD-10-CM | POA: Diagnosis not present

## 2020-01-30 NOTE — H&P (Signed)
Psychiatric Admission Assessment Adult  Patient Identification: Samuel Molina MRN:  [id number] Date of Evaluation:  01/30/2020 Chief Complaint:  Severe recurrent major depression without psychotic features ([place]) [F33.2] Principal Diagnosis: Severe recurrent major depression without psychotic features ([place]) Diagnosis:  Principal Problem:   Severe recurrent major depression without psychotic features ([place]) Active Problems:   Cocaine abuse ([place])  History of Present Illness: Patient seen and chart reviewed.  Patient known from previous encounters.  59 year old man presented to the emergency room reporting depressed mood with suicidal ideation and relapse on cocaine.  He had left the unit [place] a few days previously but returns stating that he got into "the wrong crowd" and relapsed into cocaine use.  States that his mood is back to feeling hopeless sad and irritable and that he had thoughts about killing himself although he has no specific means he is reporting.  He is requesting referral again to inpatient substance abuse treatment. Associated Signs/Symptoms: Depression Symptoms:  depressed mood, anhedonia, psychomotor retardation, fatigue, feelings of worthlessness/guilt, difficulty concentrating, hopelessness, suicidal thoughts without plan, Duration of Depression Symptoms: No data recorded (Hypo) Manic Symptoms:  Impulsivity, Anxiety Symptoms:  Excessive Worry, Psychotic Symptoms:  No specific psychotic symptoms Duration of Psychotic Symptoms: No data recorded PTSD Symptoms: Negative Total Time spent with patient: 1 hour  Past Psychiatric History: Patient was in the hospital recently for identical complaints.  He was referred to the alcohol and drug abuse treatment center at that time but did not stay long enough for referral.  Other than that he appears to have no past inpatient or outpatient treatment.  Is the patient at risk to self? Yes.    Has the patient  been a risk to self in the past 6 months? Yes.    Has the patient been a risk to self within the distant past? No.  Is the patient a risk to others? No.  Has the patient been a risk to others in the past 6 months? No.  Has the patient been a risk to others within the distant past? No.   Prior Inpatient Therapy:   Prior Outpatient Therapy:    Alcohol Screening: Patient refused Alcohol Screening Tool: Yes 1. How often do you have a drink containing alcohol?: Never 2. How many drinks containing alcohol do you have on a typical day when you are drinking?: 1 or 2 3. How often do you have six or more drinks on one occasion?: Never AUDIT-C Score: 0 4. How often during the last year have you found that you were not able to stop drinking once you had started?: Never 5. How often during the last year have you failed to do what was normally expected from you because of drinking?: Never 6. How often during the last year have you needed a first drink in the morning to get yourself going after a heavy drinking session?: Never 7. How often during the last year have you had a feeling of guilt of remorse after drinking?: Never 8. How often during the last year have you been unable to remember what happened the night before because you had been drinking?: Never 9. Have you or someone else been injured as a result of your drinking?: No 10. Has a relative or friend or a doctor or another health worker been concerned about your drinking or suggested you cut down?: No Alcohol Use Disorder Identification Test Final Score (AUDIT): 0 Alcohol Brief Interventions/Follow-up: Patient Refused, Alcohol Education Substance Abuse History in the last  12 months:  Yes.   Consequences of Substance Abuse: Family Consequences:  Appears to have alienated all of his family and his most recent girlfriend Previous Psychotropic Medications: Yes  Psychological Evaluations: Yes  Past Medical History:  Past Medical History:   Diagnosis Date  . GERD (gastroesophageal reflux disease)   . Obesity     Past Surgical History:  Procedure Laterality Date  . ESOPHAGOGASTRODUODENOSCOPY (EGD) WITH PROPOFOL N/A 09/20/2017   Procedure: ESOPHAGOGASTRODUODENOSCOPY (EGD) WITH PROPOFOL;  Surgeon: Lin Landsman, MD;  Location: [facility];  Service: Gastroenterology;  Laterality: N/A;  . KNEE SURGERY Right    Family History: History reviewed. No pertinent family history. Family Psychiatric  History: Denies any Tobacco Screening: Have you used any form of tobacco in the last 30 days? (Cigarettes, Smokeless Tobacco, Cigars, and/or Pipes): Yes Tobacco use, Select all that apply: 5 or more cigarettes per day Are you interested in Tobacco Cessation Medications?: No, patient refused Counseled patient on smoking cessation including recognizing danger situations, developing coping skills and basic information about quitting provided: Yes Social History:  Social History   Substance and Sexual Activity  Alcohol Use Not Currently     Social History   Substance and Sexual Activity  Drug Use Yes  . Types: Cocaine    Additional Social History:                           Allergies:   Allergies  Allergen Reactions  . Acetaminophen Rash, Itching and Nausea And Vomiting   Lab Results:  Results for orders placed or performed during the hospital encounter of 01/28/20 (from the past 48 hour(s))  Comprehensive metabolic panel     Status: None   Collection Time: 01/28/20  3:32 PM  Result Value Ref Range   Sodium 141 135 - 145 mmol/L   Potassium 4.1 3.5 - 5.1 mmol/L   Chloride 104 98 - 111 mmol/L   CO2 27 22 - 32 mmol/L   Glucose, Bld 71 70 - 99 mg/dL    Comment: Glucose reference range applies only to samples taken after fasting for at least 8 hours.   BUN 14 6 - 20 mg/dL   Creatinine, Ser 0.98 0.61 - 1.24 mg/dL   Calcium 9.4 8.9 - 10.3 mg/dL   Total Protein 8.0 6.5 - 8.1 g/dL   Albumin 4.1 3.5 - 5.0 g/dL    AST 20 15 - 41 U/L   ALT 17 0 - 44 U/L   Alkaline Phosphatase 99 38 - 126 U/L   Total Bilirubin 0.9 0.3 - 1.2 mg/dL   GFR calc non Af Amer >60 >60 mL/min   Anion gap 10 5 - 15    Comment: Performed at [facility], [address], [place], [place] [address]  Ethanol     Status: None   Collection Time: 01/28/20  3:32 PM  Result Value Ref Range   Alcohol, Ethyl (B) <10 <10 mg/dL    Comment: (NOTE) Lowest detectable limit for serum alcohol is 10 mg/dL.  For medical purposes only. Performed at [facility], [address] [place], [place] [address]   Salicylate level     Status: Abnormal   Collection Time: 01/28/20  3:32 PM  Result Value Ref Range   Salicylate Lvl <9.4 (L) 7.0 - 30.0 mg/dL    Comment: Performed at [facility], [address], [place], [place] [address]  Acetaminophen level     Status: Abnormal  Collection Time: 01/28/20  3:32 PM  Result Value Ref Range   Acetaminophen (Tylenol), Serum <10 (L) 10 - 30 ug/mL    Comment: (NOTE) Therapeutic concentrations vary significantly. A range of 10-30 ug/mL  may be an effective concentration for many patients. However, some  are best treated at concentrations outside of this range. Acetaminophen concentrations >150 ug/mL at 4 hours after ingestion  and >50 ug/mL at 12 hours after ingestion are often associated with  toxic reactions.  Performed at [facility], [address] [place], [place] [address]   cbc     Status: Abnormal   Collection Time: 01/28/20  3:32 PM  Result Value Ref Range   WBC 10.7 (H) 4.0 - 10.5 K/uL   RBC 5.78 4.22 - 5.81 MIL/uL   Hemoglobin 15.4 13.0 - 17.0 g/dL   HCT 48.0 39 - 52 %   MCV 83.0 80.0 - 100.0 fL   MCH 26.6 26.0 - 34.0 pg   MCHC 32.1 30.0 - 36.0 g/dL   RDW 14.5 11.5 - 15.5 %   Platelets 284 150 - 400 K/uL   nRBC 0.0 0.0 - 0.2 %    Comment: Performed at [facility], [address], [place], [place] [address]  Urine Drug Screen,  Qualitative     Status: Abnormal   Collection Time: 01/28/20  8:18 PM  Result Value Ref Range   Tricyclic, Ur Screen NONE DETECTED NONE DETECTED   Amphetamines, Ur Screen NONE DETECTED NONE DETECTED   MDMA (Ecstasy)Ur Screen NONE DETECTED NONE DETECTED   Cocaine Metabolite,Ur [place] POSITIVE (A) NONE DETECTED   Opiate, Ur Screen NONE DETECTED NONE DETECTED   Phencyclidine (PCP) Ur S NONE DETECTED NONE DETECTED   Cannabinoid 50 Ng, Ur [place] NONE DETECTED NONE DETECTED   Barbiturates, Ur Screen NONE DETECTED NONE DETECTED   Benzodiazepine, Ur Scrn NONE DETECTED NONE DETECTED   Methadone Scn, Ur NONE DETECTED NONE DETECTED    Comment: (NOTE) Tricyclics + metabolites, urine    Cutoff 1000 ng/mL Amphetamines + metabolites, urine  Cutoff 1000 ng/mL MDMA (Ecstasy), urine              Cutoff 500 ng/mL Cocaine Metabolite, urine          Cutoff 300 ng/mL Opiate + metabolites, urine        Cutoff 300 ng/mL Phencyclidine (PCP), urine         Cutoff 25 ng/mL Cannabinoid, urine                 Cutoff 50 ng/mL Barbiturates + metabolites, urine  Cutoff 200 ng/mL Benzodiazepine, urine              Cutoff 200 ng/mL Methadone, urine                   Cutoff 300 ng/mL  The urine drug screen provides only a preliminary, unconfirmed analytical test result and should not be used for non-medical purposes. Clinical consideration and professional judgment should be applied to any positive drug screen result due to possible interfering substances. A more specific alternate chemical method must be used in order to obtain a confirmed analytical result. Gas chromatography / mass spectrometry (GC/MS) is the preferred confirm atory method. Performed at [facility], [address] [place], [place] [address]   Respiratory Panel by RT PCR (Flu A&B, Covid) - Nasopharyngeal Swab     Status: None   Collection Time: 01/28/20  8:18 PM   Specimen: Nasopharyngeal Swab  Result Value Ref Range   SARS Coronavirus 2  by RT PCR NEGATIVE NEGATIVE    Comment: (NOTE) SARS-CoV-2 target nucleic acids are NOT DETECTED.  The SARS-CoV-2 RNA is generally detectable in upper respiratoy specimens during the acute phase of infection. The lowest concentration of SARS-CoV-2 viral copies this assay can detect is 131 copies/mL. A negative result does not preclude SARS-Cov-2 infection and should not be used as the sole basis for treatment or other patient management decisions. A negative result may occur with  improper specimen collection/handling, submission of specimen other than nasopharyngeal swab, presence of viral mutation(s) within the areas targeted by this assay, and inadequate number of viral copies (<131 copies/mL). A negative result must be combined with clinical observations, patient history, and epidemiological information. The expected result is Negative.  Fact Sheet for Patients:  PinkCheek.be  Fact Sheet for Healthcare Providers:  GravelBags.it  This test is no t yet approved or cleared by the [place] FDA and  has been authorized for detection and/or diagnosis of SARS-CoV-2 by FDA under an Emergency Use Authorization (EUA). This EUA will remain  in effect (meaning this test can be used) for the duration of the COVID-19 declaration under Section 564(b)(1) of the Act, 21 U.S.C. section 360bbb-3(b)(1), unless the authorization is terminated or revoked sooner.     Influenza A by PCR NEGATIVE NEGATIVE   Influenza B by PCR NEGATIVE NEGATIVE    Comment: (NOTE) The Xpert Xpress SARS-CoV-2/FLU/RSV assay is intended as an aid in  the diagnosis of influenza from Nasopharyngeal swab specimens and  should not be used as a sole basis for treatment. Nasal washings and  aspirates are unacceptable for Xpert Xpress SARS-CoV-2/FLU/RSV  testing.  Fact Sheet for Patients: PinkCheek.be  Fact Sheet for Healthcare  Providers: GravelBags.it  This test is not yet approved or cleared by the [place] FDA and  has been authorized for detection and/or diagnosis of SARS-CoV-2 by  FDA under an Emergency Use Authorization (EUA). This EUA will remain  in effect (meaning this test can be used) for the duration of the  Covid-19 declaration under Section 564(b)(1) of the Act, 21  U.S.C. section 360bbb-3(b)(1), unless the authorization is  terminated or revoked. Performed at [facility], [address] [place], [place] [address]     Blood Alcohol level:  Lab Results  Component Value Date   [facility] <10 01/28/2020   ETH <10 62/70/3500    Metabolic Disorder Labs:  No results found for: HGBA1C, MPG No results found for: PROLACTIN No results found for: CHOL, TRIG, HDL, CHOLHDL, VLDL, LDLCALC  Current Medications: Current Facility-Administered Medications  Medication Dose Route Frequency Provider Last Rate Last Admin  . acetaminophen (TYLENOL) tablet 650 mg  650 mg Oral Q6H PRN Maeva Dant T, MD      . alum & mag hydroxide-simeth (MAALOX/MYLANTA) 200-200-20 MG/5ML suspension 30 mL  30 mL Oral Q4H PRN Maryagnes Carrasco T, MD      . calcium carbonate (TUMS - dosed in mg elemental calcium) chewable tablet 200 mg of elemental calcium  1 tablet Oral TID PRN [name], MD   200 mg of elemental calcium at 01/29/20 1654  . citalopram (CELEXA) tablet 10 mg  10 mg Oral Daily Dontrell Stuck, [name], MD   10 mg at 01/30/20 0831  . famotidine (PEPCID) tablet 20 mg  20 mg Oral Q12H PRN Markhi Kleckner T, MD      . magnesium hydroxide (MILK OF MAGNESIA) suspension 30 mL  30 mL Oral Daily PRN [place] T, MD      . mirtazapine (REMERON) tablet 15 mg  15 mg Oral QHS PRN Naylea Wigington, [name], MD   15 mg at 01/29/20 2112  . naproxen (NAPROSYN) tablet 500 mg  500 mg Oral BID WC Demaya Hardge, [name], MD   500 mg at 01/30/20 0831  . pantoprazole (PROTONIX) EC tablet 40 mg  40 mg Oral Daily Ligia Duguay,  [name], MD   40 mg at 01/30/20 0830   PTA Medications: Medications Prior to Admission  Medication Sig Dispense Refill Last Dose  . citalopram (CELEXA) 10 MG tablet Take 1 tablet (10 mg total) by mouth daily. 30 tablet 0   . Oxycodone HCl 10 MG TABS Take 1 tablet (10 mg total) by mouth every 6 (six) hours as needed. 10 tablet 0     Musculoskeletal: Strength & Muscle Tone: within normal limits Gait & Station: normal Patient leans: N/A  Psychiatric Specialty Exam: Physical Exam Vitals and nursing note reviewed.  Constitutional:      Appearance: He is well-developed.  HENT:     Head: Normocephalic and atraumatic.  Eyes:     Conjunctiva/sclera: Conjunctivae normal.     Pupils: Pupils are equal, round, and reactive to light.  Cardiovascular:     Heart sounds: Normal heart sounds.  Pulmonary:     Effort: Pulmonary effort is normal.  Abdominal:     Palpations: Abdomen is soft.  Musculoskeletal:        General: Normal range of motion.     Cervical back: Normal range of motion.  Skin:    General: Skin is warm and dry.  Neurological:     General: No focal deficit present.     Mental Status: He is alert.  Psychiatric:        Attention and Perception: He is inattentive.        Mood and Affect: Mood is depressed.        Speech: Speech is delayed.        Behavior: Behavior is slowed.        Thought Content: Thought content includes suicidal ideation. Thought content does not include suicidal plan.        Cognition and Memory: Cognition normal.        Judgment: Judgment is impulsive.     Review of Systems  Constitutional: Negative.   HENT: Negative.   Eyes: Negative.   Respiratory: Negative.   Cardiovascular: Negative.   Gastrointestinal: Negative.   Musculoskeletal: Negative.   Skin: Negative.   Neurological: Negative.   Psychiatric/Behavioral: Positive for dysphoric mood and suicidal ideas.    Blood pressure 121/80, pulse (!) 57, temperature 98 F (36.7 C), resp. rate  17, height 6\' 1"  (1.854 m), weight 95.3 kg, SpO2 100 %.Body mass index is 27.71 kg/m.  General Appearance: Casual  Eye Contact:  Fair  Speech:  Slow  Volume:  Decreased  Mood:  Depressed  Affect:  Congruent  Thought Process:  Coherent  Orientation:  Full (Time, Place, and Person)  Thought Content:  Logical  Suicidal Thoughts:  Yes.  without intent/plan  Homicidal Thoughts:  No  Memory:  Immediate;   Fair Recent;   Fair Remote;   Fair  Judgement:  Fair  Insight:  Fair  Psychomotor Activity:  Normal  Concentration:  Concentration: Fair  Recall:  [employer] of Knowledge:  Fair  Language:  Fair  Akathisia:  No  Handed:  Right  AIMS (if indicated):  Assets:  Desire for Improvement Physical Health Resilience  ADL's:  Intact  Cognition:  WNL  Sleep:  Number of Hours: 7    Treatment Plan Summary: Daily contact with patient to assess and evaluate symptoms and progress in treatment, Medication management and Plan Patient is restarted on Celexa which had recently been initiated for depression and anxiety.  Continue 15-minute checks.  Involved patient is much as possible and assessment on an individual and group level of focusing on improving mood stability and getting substance abuse problems under control.  Once again he is requesting referral to the alcohol and drug abuse treatment center which we will attempt.  Observation Level/Precautions:  15 minute checks  Laboratory:  Chemistry Profile  Psychotherapy:    Medications:    Consultations:    Discharge Concerns:    Estimated LOS:  Other:     Physician Treatment Plan for Primary Diagnosis: Severe recurrent major depression without psychotic features ([place]) Long Term Goal(s): Improvement in symptoms so as ready for discharge  Short Term Goals: Ability to verbalize feelings will improve, Ability to disclose and discuss suicidal ideas and Ability to demonstrate self-control will improve  Physician Treatment Plan for Secondary  Diagnosis: Principal Problem:   Severe recurrent major depression without psychotic features ([place]) Active Problems:   Cocaine abuse ([place])  Long Term Goal(s): Improvement in symptoms so as ready for discharge  Short Term Goals: Ability to maintain clinical measurements within normal limits will improve and Compliance with prescribed medications will improve  I certify that inpatient services furnished can reasonably be expected to improve the patient's condition.    Alethia Berthold, MD 10/8/20212:54 PM

## 2020-01-30 NOTE — Progress Notes (Signed)
Recreation Therapy Notes  INPATIENT RECREATION THERAPY ASSESSMENT  Patient Details Name: Durante Violett MRN: [id number] DOB: November 22, 1960 Today's Date: 01/30/2020       Information Obtained From:  (Unable to awake patient for assessment)  Able to Participate in Assessment/Interview:    Patient Presentation:    Reason for Admission (Per Patient):    Patient Stressors:    Coping Skills:      Leisure Interests (2+):     Frequency of Recreation/Participation:    Awareness of Community Resources:     [employer]:     Current Use:    If no, Barriers?:    Expressed Interest in [place] of Residence:     Patient Main Form of Transportation:    Patient Strengths:     Patient Identified Areas of Improvement:     Patient Goal for Hospitalization:     Current SI (including self-harm):     Current HI:     Current AVH:    Staff Intervention Plan:    Consent to Intern Participation:    Iliana Hutt 01/30/2020, 3:49 PM

## 2020-01-30 NOTE — Plan of Care (Signed)
Pt rates depression 7/10, hopelessness 7/10 and anxiety 8/10. Pt denies SI, HI and AVH. Pt was educated on care plan and verbalizes understanding. Collier Bullock RN Problem: Education: Goal: Freight forwarder Education information/materials will improve Outcome: Progressing   Problem: Health Behavior/Discharge Planning: Goal: Identification of resources available to assist in meeting health care needs will improve Outcome: Progressing Goal: Compliance with treatment plan for underlying cause of condition will improve Outcome: Progressing   Problem: Education: Goal: Utilization of techniques to improve thought processes will improve Outcome: Progressing Goal: Knowledge of the prescribed therapeutic regimen will improve Outcome: Progressing   Problem: Coping: Goal: Coping ability will improve Outcome: Progressing Goal: Will verbalize feelings Outcome: Progressing   Problem: Safety: Goal: Ability to disclose and discuss suicidal ideas will improve Outcome: Progressing Goal: Ability to identify and utilize support systems that promote safety will improve Outcome: Progressing   Problem: Self-Concept: Goal: Will verbalize positive feelings about self Outcome: Progressing Goal: Level of anxiety will decrease Outcome: Progressing

## 2020-01-30 NOTE — [facility] Suicide Risk Assessment (Signed)
[facility] Admission Suicide Risk Assessment   Nursing information obtained from:  Patient Demographic factors:  Male, Low socioeconomic status, Living alone Current Mental Status:  NA Loss Factors:  Loss of significant relationship, Decline in physical health, Financial problems / change in socioeconomic status Historical Factors:  NA Risk Reduction Factors:  Employed, Positive social support  Total Time spent with patient: 1 hour Principal Problem: Severe recurrent major depression without psychotic features ([place]) Diagnosis:  Principal Problem:   Severe recurrent major depression without psychotic features ([place]) Active Problems:   Cocaine abuse ([place])  Subjective Data: Patient seen and chart reviewed.  Patient known from previous encounters.  Presented to the emergency room reporting depressed mood hopelessness and suicidal thoughts.  No specific intent or plan.  Currently lucid and motivated for treatment  Continued Clinical Symptoms:  Alcohol Use Disorder Identification Test Final Score (AUDIT): 0 The "Alcohol Use Disorders Identification Test", Guidelines for Use in Primary Care, Second Edition.  World Pharmacologist [facility]). Score between 0-7:  no or low risk or alcohol related problems. Score between 8-15:  moderate risk of alcohol related problems. Score between 16-19:  high risk of alcohol related problems. Score 20 or above:  warrants further diagnostic evaluation for alcohol dependence and treatment.   CLINICAL FACTORS:   Depression:   Impulsivity Alcohol/Substance Abuse/Dependencies   Musculoskeletal: Strength & Muscle Tone: within normal limits Gait & Station: normal Patient leans: N/A  Psychiatric Specialty Exam: Physical Exam Vitals and nursing note reviewed.  Constitutional:      Appearance: He is well-developed.  HENT:     Head: Normocephalic and atraumatic.  Eyes:     Conjunctiva/sclera: Conjunctivae normal.     Pupils: Pupils are equal, round, and reactive  to light.  Cardiovascular:     Heart sounds: Normal heart sounds.  Pulmonary:     Effort: Pulmonary effort is normal.  Abdominal:     Palpations: Abdomen is soft.  Musculoskeletal:        General: Normal range of motion.     Cervical back: Normal range of motion.  Skin:    General: Skin is warm and dry.  Neurological:     General: No focal deficit present.     Mental Status: He is alert.  Psychiatric:        Attention and Perception: He is inattentive.        Mood and Affect: Affect is blunt.        Speech: Speech normal.        Behavior: Behavior is slowed and withdrawn.        Thought Content: Thought content includes suicidal ideation. Thought content does not include suicidal plan.        Cognition and Memory: Cognition is impaired.        Judgment: Judgment is impulsive.     Review of Systems  Constitutional: Negative.   HENT: Negative.   Eyes: Negative.   Respiratory: Negative.   Cardiovascular: Negative.   Gastrointestinal: Negative.   Musculoskeletal: Negative.   Skin: Negative.   Neurological: Negative.   Psychiatric/Behavioral: Positive for dysphoric mood and suicidal ideas.    Blood pressure 121/80, pulse (!) 57, temperature 98 F (36.7 C), resp. rate 17, height 6\' 1"  (1.854 m), weight 95.3 kg, SpO2 100 %.Body mass index is 27.71 kg/m.  General Appearance: Casual  Eye Contact:  Minimal  Speech:  Slow  Volume:  Decreased  Mood:  Depressed  Affect:  Congruent  Thought Process:  Goal Directed  Orientation:  Full (Time, Place, and Person)  Thought Content:  Logical  Suicidal Thoughts:  Yes.  without intent/plan  Homicidal Thoughts:  No  Memory:  Immediate;   Fair Recent;   Fair Remote;   Fair  Judgement:  Fair  Insight:  Fair  Psychomotor Activity:  Decreased  Concentration:  Concentration: Fair  Recall:  [employer] of Knowledge:  Fair  Language:  Fair  Akathisia:  No  Handed:  Right  AIMS (if indicated):     Assets:  Desire for  Improvement Resilience  ADL's:  Impaired  Cognition:  Impaired,  Mild  Sleep:  Number of Hours: 7      COGNITIVE FEATURES THAT CONTRIBUTE TO RISK:  Closed-mindedness    SUICIDE RISK:   Mild:  Suicidal ideation of limited frequency, intensity, duration, and specificity.  There are no identifiable plans, no associated intent, mild dysphoria and related symptoms, good self-control (both objective and subjective assessment), few other risk factors, and identifiable protective factors, including available and accessible social support.  PLAN OF CARE: Continue 15-minute checks.  Continue antidepressant medicine.  Urged patient to engage in individual and group therapy and to discuss substance abuse treatment.  Working on referral to inpatient treatment.  Reassess dangerousness prior to discharge  I certify that inpatient services furnished can reasonably be expected to improve the patient's condition.   Alethia Berthold, MD 01/30/2020, 2:51 PM

## 2020-01-30 NOTE — Progress Notes (Signed)
[facility] MD Progress Note  01/30/2020 11:24 AM Samuel Molina  MRN:  [id number] Subjective: Follow-up for this patient who complains of depression and cocaine abuse.  Patient is in his room this morning in bed and says he has a bad headache all over his head.  He had his Naprosyn this morning.  I was going to give him acetaminophen but reportedly he is allergic to it.  Otherwise no change in presentation.  Presumed passive suicidal ideation without any intent or plan no obvious psychosis Principal Problem: Severe recurrent major depression without psychotic features ([place]) Diagnosis: Principal Problem:   Severe recurrent major depression without psychotic features ([place]) Active Problems:   Cocaine abuse ([place])  Total Time spent with patient: 30 minutes  Past Psychiatric History:   Past Medical History:  Past Medical History:  Diagnosis Date  . GERD (gastroesophageal reflux disease)   . Obesity     Past Surgical History:  Procedure Laterality Date  . ESOPHAGOGASTRODUODENOSCOPY (EGD) WITH PROPOFOL N/A 09/20/2017   Procedure: ESOPHAGOGASTRODUODENOSCOPY (EGD) WITH PROPOFOL;  Surgeon: Lin Landsman, MD;  Location: [place];  Service: Gastroenterology;  Laterality: N/A;  . KNEE SURGERY Right    Family History: History reviewed. No pertinent family history. Family Psychiatric  History: Family history of substance abuse but no details provided Social History:  Social History   Substance and Sexual Activity  Alcohol Use Not Currently     Social History   Substance and Sexual Activity  Drug Use Yes  . Types: Cocaine    Social History   Socioeconomic History  . Marital status: Single    Spouse name: Not on file  . Number of children: Not on file  . Years of education: Not on file  . Highest education level: Not on file  Occupational History  . Not on file  Tobacco Use  . Smoking status: Current Every Day Smoker    Packs/day: 0.50    Types: Cigarettes  . Smokeless  tobacco: Never Used  Vaping Use  . Vaping Use: Never used  Substance and Sexual Activity  . Alcohol use: Not Currently  . Drug use: Yes    Types: Cocaine  . Sexual activity: Yes    Birth control/protection: Condom  Other Topics Concern  . Not on file  Social History Narrative  . Not on file   Social Determinants of Health   Financial Resource Strain:   . Difficulty of Paying Living Expenses: Not on file  Food Insecurity:   . Worried About Charity fundraiser in the Last Year: Not on file  . Ran Out of Food in the Last Year: Not on file  Transportation Needs:   . Lack of Transportation (Medical): Not on file  . Lack of Transportation (Non-Medical): Not on file  Physical Activity:   . Days of Exercise per Week: Not on file  . Minutes of Exercise per Session: Not on file  Stress:   . Feeling of Stress : Not on file  Social Connections:   . Frequency of Communication with Friends and Family: Not on file  . Frequency of Social Gatherings with Friends and Family: Not on file  . Attends Religious Services: Not on file  . Active Member of Clubs or Organizations: Not on file  . Attends Archivist Meetings: Not on file  . Marital Status: Not on file   Additional Social History:  Sleep: Fair  Appetite:  Fair  Current Medications: Current Facility-Administered Medications  Medication Dose Route Frequency Provider Last Rate Last Admin  . acetaminophen (TYLENOL) tablet 650 mg  650 mg Oral Q6H PRN Carys Malina T, MD      . alum & mag hydroxide-simeth (MAALOX/MYLANTA) 200-200-20 MG/5ML suspension 30 mL  30 mL Oral Q4H PRN Makari Sanko T, MD      . calcium carbonate (TUMS - dosed in mg elemental calcium) chewable tablet 200 mg of elemental calcium  1 tablet Oral TID PRN [name], MD   200 mg of elemental calcium at 01/29/20 1654  . citalopram (CELEXA) tablet 10 mg  10 mg Oral Daily Naydeline Morace, [name], MD   10 mg at 01/30/20 0831  .  famotidine (PEPCID) tablet 20 mg  20 mg Oral Q12H PRN Sixto Bowdish T, MD      . magnesium hydroxide (MILK OF MAGNESIA) suspension 30 mL  30 mL Oral Daily PRN Edwen Mclester T, MD      . mirtazapine (REMERON) tablet 15 mg  15 mg Oral QHS PRN Winola Drum T, MD   15 mg at 01/29/20 2112  . naproxen (NAPROSYN) tablet 500 mg  500 mg Oral BID WC Jazzalynn Rhudy, [name], MD   500 mg at 01/30/20 0831  . pantoprazole (PROTONIX) EC tablet 40 mg  40 mg Oral Daily Kimaya Whitlatch, [name], MD   40 mg at 01/30/20 0830    Lab Results:  Results for orders placed or performed during the hospital encounter of 01/28/20 (from the past 48 hour(s))  Comprehensive metabolic panel     Status: None   Collection Time: 01/28/20  3:32 PM  Result Value Ref Range   Sodium 141 135 - 145 mmol/L   Potassium 4.1 3.5 - 5.1 mmol/L   Chloride 104 98 - 111 mmol/L   CO2 27 22 - 32 mmol/L   Glucose, Bld 71 70 - 99 mg/dL    Comment: Glucose reference range applies only to samples taken after fasting for at least 8 hours.   BUN 14 6 - 20 mg/dL   Creatinine, Ser 0.98 0.61 - 1.24 mg/dL   Calcium 9.4 8.9 - 10.3 mg/dL   Total Protein 8.0 6.5 - 8.1 g/dL   Albumin 4.1 3.5 - 5.0 g/dL   AST 20 15 - 41 U/L   ALT 17 0 - 44 U/L   Alkaline Phosphatase 99 38 - 126 U/L   Total Bilirubin 0.9 0.3 - 1.2 mg/dL   GFR calc non Af Amer >60 >60 mL/min   Anion gap 10 5 - 15    Comment: Performed at [facility], [address], [place], [place] [address]  Ethanol     Status: None   Collection Time: 01/28/20  3:32 PM  Result Value Ref Range   Alcohol, Ethyl (B) <10 <10 mg/dL    Comment: (NOTE) Lowest detectable limit for serum alcohol is 10 mg/dL.  For medical purposes only. Performed at [facility], [address] [place], [place] [address]   Salicylate level     Status: Abnormal   Collection Time: 01/28/20  3:32 PM  Result Value Ref Range   Salicylate Lvl <6.2 (L) 7.0 - 30.0 mg/dL    Comment: Performed at [facility],  [address] [place], [place] [address]  Acetaminophen level     Status: Abnormal   Collection Time: 01/28/20  3:32 PM  Result Value Ref Range   Acetaminophen (Tylenol),  Serum <10 (L) 10 - 30 ug/mL    Comment: (NOTE) Therapeutic concentrations vary significantly. A range of 10-30 ug/mL  may be an effective concentration for many patients. However, some  are best treated at concentrations outside of this range. Acetaminophen concentrations >150 ug/mL at 4 hours after ingestion  and >50 ug/mL at 12 hours after ingestion are often associated with  toxic reactions.  Performed at [facility], [address] [place], [place] [address]   cbc     Status: Abnormal   Collection Time: 01/28/20  3:32 PM  Result Value Ref Range   WBC 10.7 (H) 4.0 - 10.5 K/uL   RBC 5.78 4.22 - 5.81 MIL/uL   Hemoglobin 15.4 13.0 - 17.0 g/dL   HCT 48.0 39 - 52 %   MCV 83.0 80.0 - 100.0 fL   MCH 26.6 26.0 - 34.0 pg   MCHC 32.1 30.0 - 36.0 g/dL   RDW 14.5 11.5 - 15.5 %   Platelets 284 150 - 400 K/uL   nRBC 0.0 0.0 - 0.2 %    Comment: Performed at [facility], [address], [place], [place] [address]  Urine Drug Screen, Qualitative     Status: Abnormal   Collection Time: 01/28/20  8:18 PM  Result Value Ref Range   Tricyclic, Ur Screen NONE DETECTED NONE DETECTED   Amphetamines, Ur Screen NONE DETECTED NONE DETECTED   MDMA (Ecstasy)Ur Screen NONE DETECTED NONE DETECTED   Cocaine Metabolite,Ur [place] POSITIVE (A) NONE DETECTED   Opiate, Ur Screen NONE DETECTED NONE DETECTED   Phencyclidine (PCP) Ur S NONE DETECTED NONE DETECTED   Cannabinoid 50 Ng, Ur [place] NONE DETECTED NONE DETECTED   Barbiturates, Ur Screen NONE DETECTED NONE DETECTED   Benzodiazepine, Ur Scrn NONE DETECTED NONE DETECTED   Methadone Scn, Ur NONE DETECTED NONE DETECTED    Comment: (NOTE) Tricyclics + metabolites, urine    Cutoff 1000 ng/mL Amphetamines + metabolites, urine  Cutoff 1000 ng/mL MDMA (Ecstasy), urine               Cutoff 500 ng/mL Cocaine Metabolite, urine          Cutoff 300 ng/mL Opiate + metabolites, urine        Cutoff 300 ng/mL Phencyclidine (PCP), urine         Cutoff 25 ng/mL Cannabinoid, urine                 Cutoff 50 ng/mL Barbiturates + metabolites, urine  Cutoff 200 ng/mL Benzodiazepine, urine              Cutoff 200 ng/mL Methadone, urine                   Cutoff 300 ng/mL  The urine drug screen provides only a preliminary, unconfirmed analytical test result and should not be used for non-medical purposes. Clinical consideration and professional judgment should be applied to any positive drug screen result due to possible interfering substances. A more specific alternate chemical method must be used in order to obtain a confirmed analytical result. Gas chromatography / mass spectrometry (GC/MS) is the preferred confirm atory method. Performed at [facility], [address] [place], [place] [address]   Respiratory Panel by RT PCR (Flu A&B, Covid) - Nasopharyngeal Swab     Status: None   Collection Time: 01/28/20  8:18 PM   Specimen: Nasopharyngeal Swab  Result Value Ref Range   SARS Coronavirus 2 by RT PCR NEGATIVE NEGATIVE  Comment: (NOTE) SARS-CoV-2 target nucleic acids are NOT DETECTED.  The SARS-CoV-2 RNA is generally detectable in upper respiratoy specimens during the acute phase of infection. The lowest concentration of SARS-CoV-2 viral copies this assay can detect is 131 copies/mL. A negative result does not preclude SARS-Cov-2 infection and should not be used as the sole basis for treatment or other patient management decisions. A negative result may occur with  improper specimen collection/handling, submission of specimen other than nasopharyngeal swab, presence of viral mutation(s) within the areas targeted by this assay, and inadequate number of viral copies (<131 copies/mL). A negative result must be combined with clinical observations,  patient history, and epidemiological information. The expected result is Negative.  Fact Sheet for Patients:  PinkCheek.be  Fact Sheet for Healthcare Providers:  GravelBags.it  This test is no t yet approved or cleared by the [place] FDA and  has been authorized for detection and/or diagnosis of SARS-CoV-2 by FDA under an Emergency Use Authorization (EUA). This EUA will remain  in effect (meaning this test can be used) for the duration of the COVID-19 declaration under Section 564(b)(1) of the Act, 21 U.S.C. section 360bbb-3(b)(1), unless the authorization is terminated or revoked sooner.     Influenza A by PCR NEGATIVE NEGATIVE   Influenza B by PCR NEGATIVE NEGATIVE    Comment: (NOTE) The Xpert Xpress SARS-CoV-2/FLU/RSV assay is intended as an aid in  the diagnosis of influenza from Nasopharyngeal swab specimens and  should not be used as a sole basis for treatment. Nasal washings and  aspirates are unacceptable for Xpert Xpress SARS-CoV-2/FLU/RSV  testing.  Fact Sheet for Patients: PinkCheek.be  Fact Sheet for Healthcare Providers: GravelBags.it  This test is not yet approved or cleared by the [place] FDA and  has been authorized for detection and/or diagnosis of SARS-CoV-2 by  FDA under an Emergency Use Authorization (EUA). This EUA will remain  in effect (meaning this test can be used) for the duration of the  Covid-19 declaration under Section 564(b)(1) of the Act, 21  U.S.C. section 360bbb-3(b)(1), unless the authorization is  terminated or revoked. Performed at [facility], [address] [place], [place] [address]     Blood Alcohol level:  Lab Results  Component Value Date   [facility] <10 01/28/2020   ETH <10 05/02/3233    Metabolic Disorder Labs: No results found for: HGBA1C, MPG No results found for: PROLACTIN No results  found for: CHOL, TRIG, HDL, CHOLHDL, VLDL, LDLCALC  Physical Findings: AIMS:  , ,  ,  ,    CIWA:    COWS:     Musculoskeletal: Strength & Muscle Tone: within normal limits Gait & Station: normal Patient leans: N/A  Psychiatric Specialty Exam: Physical Exam Vitals and nursing note reviewed.  Constitutional:      Appearance: He is well-developed.  HENT:     Head: Normocephalic and atraumatic.  Eyes:     Conjunctiva/sclera: Conjunctivae normal.     Pupils: Pupils are equal, round, and reactive to light.  Cardiovascular:     Heart sounds: Normal heart sounds.  Pulmonary:     Effort: Pulmonary effort is normal.  Abdominal:     Palpations: Abdomen is soft.  Musculoskeletal:        General: Normal range of motion.     Cervical back: Normal range of motion.  Skin:    General: Skin is warm and dry.  Neurological:     General: No focal deficit present.  Mental Status: He is alert.  Psychiatric:        Attention and Perception: Attention normal.        Mood and Affect: Affect is blunt.        Speech: Speech is delayed.        Behavior: Behavior is withdrawn.        Thought Content: Thought content includes suicidal ideation. Thought content does not include homicidal ideation. Thought content does not include suicidal plan.        Cognition and Memory: Cognition normal.        Judgment: Judgment is impulsive.     Review of Systems  Constitutional: Negative.   HENT: Negative.   Eyes: Negative.   Respiratory: Negative.   Cardiovascular: Negative.   Gastrointestinal: Negative.   Musculoskeletal: Negative.   Skin: Negative.   Neurological: Positive for headaches.  Psychiatric/Behavioral: Positive for dysphoric mood.    Blood pressure 121/80, pulse (!) 57, temperature 98 F (36.7 C), resp. rate 17, height 6\' 1"  (1.854 m), weight 95.3 kg, SpO2 100 %.Body mass index is 27.71 kg/m.  General Appearance: Casual  Eye Contact:  Fair  Speech:  Slow  Volume:  Decreased   Mood:  Anxious  Affect:  Constricted  Thought Process:  Coherent  Orientation:  Full (Time, Place, and Person)  Thought Content:  Logical  Suicidal Thoughts:  Yes.  without intent/plan  Homicidal Thoughts:  No  Memory:  Immediate;   Fair Recent;   Fair Remote;   Fair  Judgement:  Fair  Insight:  Fair  Psychomotor Activity:  Normal  Concentration:  Concentration: Fair  Recall:  [employer] of Knowledge:  Fair  Language:  Fair  Akathisia:  No  Handed:  Right  AIMS (if indicated):     Assets:  Desire for Improvement Physical Health  ADL's:  Impaired  Cognition:  WNL  Sleep:  Number of Hours: 7     Treatment Plan Summary: Daily contact with patient to assess and evaluate symptoms and progress in treatment, Medication management and Plan No change to current medicine.  Patient had been told on admission that narcotics were not a reasonable option in the hospital while we awaited referral to substance abuse treatment.  Not clear that he would benefit from or has any interest in trying triptans.  Continue keeping him on nonsteroidals.  Patient reports he does not have chronic headaches hopefully this will improve soon.  Encourage group attendance and participation.  No change to antidepressant  Alethia Berthold, MD 01/30/2020, 11:24 AM

## 2020-01-30 NOTE — [facility] Counselor (Signed)
CSW attempted to complete the patient's assessment. Pt asked that CSW return due to being tired.   Assunta Curtis, MSW, LCSW 01/30/2020 4:33 PM

## 2020-01-30 NOTE — Tx Team (Addendum)
Interdisciplinary Treatment and Diagnostic Plan Update  01/30/2020 Time of Session: 9:00AM [name] MRN: [id number]  Principal Diagnosis: Severe recurrent major depression without psychotic features [facility])  Secondary Diagnoses: Principal Problem:   Severe recurrent major depression without psychotic features ([name]) Active Problems:   Cocaine abuse ([name])   Current Medications:  Current Facility-Administered Medications  Medication Dose Route Frequency Provider Last Rate Last Admin  . acetaminophen (TYLENOL) tablet 650 mg  650 mg Oral Q6H PRN Clapacs, John T, MD      . alum & mag hydroxide-simeth (MAALOX/MYLANTA) 200-200-20 MG/5ML suspension 30 mL  30 mL Oral Q4H PRN Clapacs, John T, MD      . calcium carbonate (TUMS - dosed in mg elemental calcium) chewable tablet 200 mg of elemental calcium  1 tablet Oral TID PRN [name], MD   200 mg of elemental calcium at 01/29/20 1654  . citalopram (CELEXA) tablet 10 mg  10 mg Oral Daily Clapacs, [name], MD   10 mg at 01/30/20 0831  . famotidine (PEPCID) tablet 20 mg  20 mg Oral Q12H PRN Clapacs, John T, MD      . magnesium hydroxide (MILK OF MAGNESIA) suspension 30 mL  30 mL Oral Daily PRN Clapacs, John T, MD      . mirtazapine (REMERON) tablet 15 mg  15 mg Oral QHS PRN Clapacs, John T, MD   15 mg at 01/29/20 2112  . naproxen (NAPROSYN) tablet 500 mg  500 mg Oral BID WC Clapacs, [name], MD   500 mg at 01/30/20 0831  . pantoprazole (PROTONIX) EC tablet 40 mg  40 mg Oral Daily Clapacs, [name], MD   40 mg at 01/30/20 0830   PTA Medications: Medications Prior to Admission  Medication Sig Dispense Refill Last Dose  . citalopram (CELEXA) 10 MG tablet Take 1 tablet (10 mg total) by mouth daily. 30 tablet 0   . Oxycodone HCl 10 MG TABS Take 1 tablet (10 mg total) by mouth every 6 (six) hours as needed. 10 tablet 0     Patient Stressors: Loss of friend who recently passed away Substance abuse  Patient Strengths: Music therapist Motivation for treatment/growth  Treatment Modalities: Medication Management, Group therapy, Case management,  1 to 1 session with clinician, Psychoeducation, Recreational therapy.   Physician Treatment Plan for Primary Diagnosis: Severe recurrent major depression without psychotic features ([name]) Long Term Goal(s):     Short Term Goals:    Medication Management: Evaluate patient's response, side effects, and tolerance of medication regimen.  Therapeutic Interventions: 1 to 1 sessions, Unit Group sessions and Medication administration.  Evaluation of Outcomes: Not Progressing  Physician Treatment Plan for Secondary Diagnosis: Principal Problem:   Severe recurrent major depression without psychotic features ([name]) Active Problems:   Cocaine abuse ([name])  Long Term Goal(s):     Short Term Goals:       Medication Management: Evaluate patient's response, side effects, and tolerance of medication regimen.  Therapeutic Interventions: 1 to 1 sessions, Unit Group sessions and Medication administration.  Evaluation of Outcomes: Not Progressing   RN Treatment Plan for Primary Diagnosis: Severe recurrent major depression without psychotic features ([name]) Long Term Goal(s): Knowledge of disease and therapeutic regimen to maintain health will improve  Short Term Goals: Ability to demonstrate self-control, Ability to participate in decision making will improve, Ability to verbalize feelings will improve, Ability to disclose and discuss suicidal ideas, Ability to identify and develop effective coping behaviors will improve and Compliance with  prescribed medications will improve  Medication Management: RN will administer medications as ordered by provider, will assess and evaluate patient's response and provide education to patient for prescribed medication. RN will report any adverse and/or side effects to prescribing provider.  Therapeutic Interventions: 1 on 1 counseling sessions,  Psychoeducation, Medication administration, Evaluate responses to treatment, Monitor vital signs and CBGs as ordered, Perform/monitor CIWA, COWS, AIMS and Fall Risk screenings as ordered, Perform wound care treatments as ordered.  Evaluation of Outcomes: Not Met   LCSW Treatment Plan for Primary Diagnosis: Severe recurrent major depression without psychotic features ([name]) Long Term Goal(s): Safe transition to appropriate next level of care at discharge, Engage patient in therapeutic group addressing interpersonal concerns.  Short Term Goals: Engage patient in aftercare planning with referrals and resources, Increase social support, Increase ability to appropriately verbalize feelings, Increase emotional regulation, Facilitate acceptance of mental health diagnosis and concerns, Facilitate patient progression through stages of change regarding substance use diagnoses and concerns, Identify triggers associated with mental health/substance abuse issues and Increase skills for wellness and recovery  Therapeutic Interventions: Assess for all discharge needs, 1 to 1 time with Social worker, Explore available resources and support systems, Assess for adequacy in community support network, Educate family and significant other(s) on suicide prevention, Complete Psychosocial Assessment, Interpersonal group therapy.  Evaluation of Outcomes: Not Met   Progress in Treatment: Attending groups: No. Participating in groups: No. Taking medication as prescribed: Yes. Toleration medication: Yes. Family/Significant other contact made: No, will contact:  once permission is given. Patient understands diagnosis: Yes. Discussing patient identified problems/goals with staff: Yes. Medical problems stabilized or resolved: Yes. Denies suicidal/homicidal ideation: Yes. Issues/concerns per patient self-inventory: No. Other: none  New problem(s) identified: No, Describe:  none  New Short Term/Long Term Goal(s): detox,  elimination of symptoms of psychosis, medication management for mood stabilization; elimination of SI thoughts; development of comprehensive mental wellness/sobriety plan.  Patient Goals:  Patient give the opportunity to attend treatment team, however, declined due to reports of pain.  Discharge Plan or Barriers: CSW will continue to assess with patient.   Reason for Continuation of Hospitalization: Anxiety Depression Medical Issues Medication stabilization Suicidal ideation  Estimated Length of Stay:  1-7 days  Attendees: Patient:   01/30/2020 12:02 PM  Physician: Dr. Domingo Cocking, MD  01/30/2020 12:02 PM  Nursing: Collier Bullock, RN 01/30/2020 12:02 PM  RN Care Manager: 01/30/2020 12:02 PM  Social Worker: Assunta Curtis, LCSW 01/30/2020 12:02 PM  Recreational Therapist: Roanna Epley, Reather Converse, LRT 01/30/2020 12:02 PM  Other:  01/30/2020 12:02 PM  Other:  01/30/2020 12:02 PM  Other: 01/30/2020 12:02 PM    Scribe for Treatment Team: Rozann Lesches, LCSW 01/30/2020 12:02 PM

## 2020-01-30 NOTE — Progress Notes (Signed)
Recreation Therapy Notes  Date: 01/30/2020  Time: 9:30 am   Location: Craft room     Behavioral response: N/A   Intervention Topic: Communication    Discussion/Intervention: Patient did not attend group.   Clinical Observations/Feedback:  Patient did not attend group.   Sheletha Bow LRT/CTRS        Hector Venne 01/30/2020 2:35 PM

## 2020-01-31 DIAGNOSIS — F332 Major depressive disorder, recurrent severe without psychotic features: Secondary | ICD-10-CM | POA: Diagnosis not present

## 2020-01-31 MED ORDER — IBUPROFEN 600 MG PO TABS
600.0000 mg | ORAL_TABLET | Freq: Four times a day (QID) | ORAL | Status: DC | PRN
  Administered 2020-01-31: 600 mg via ORAL
  Filled 2020-01-31: qty 1

## 2020-01-31 MED ORDER — NEOMYCIN-POLYMYXIN-HC 1 % OT SOLN
3.0000 [drp] | Freq: Four times a day (QID) | OTIC | Status: DC
  Administered 2020-01-31 – 2020-02-01 (×4): 3 [drp] via OTIC
  Filled 2020-01-31: qty 10

## 2020-01-31 NOTE — Progress Notes (Signed)
Patient is animated, friendly and cooperative. Mood is pleasant. Denies SI, HI and AVH.

## 2020-01-31 NOTE — Progress Notes (Signed)
[facility] MD Progress Note  01/31/2020 11:38 AM Ladamien Rammel  MRN:  [id number]  Mr. Samuel Molina is a 59 y.o. male who presents to the [facility] unit for treatment of depression in the context of relapse on drugs (cocaine).   Interval History Patient was seen today for re-evaluation.  Nursing reports no events overnight. The patient has no issues with performing ADLs.  Patient has been medication compliant.    SUBJECTIVE: On assessment patient reports feeling "depressed", denies feeling suicidal or having any unsafe urges or plans. He continues to complain about hip pain, he already took medication for pain this morning and is waiting for improvement. Reports he slept somewhat last night. Denies side effects from recently-restarted antidepressant. He is interested in inpatient substance-use treatment.   Current suicidal/homicidal ideations: Denies Current auditory/visual hallucinations: Denies The patient reports no side effects from medications.    Labs: no new results for review.    Principal Problem: Severe recurrent major depression without psychotic features ([place]) Diagnosis: Principal Problem:   Severe recurrent major depression without psychotic features ([place]) Active Problems:   Cocaine abuse ([place])  Total Time spent with patient: 15 minutes  Past Psychiatric History: see H&P  Past Medical History:  Past Medical History:  Diagnosis Date  . GERD (gastroesophageal reflux disease)   . Obesity     Past Surgical History:  Procedure Laterality Date  . ESOPHAGOGASTRODUODENOSCOPY (EGD) WITH PROPOFOL N/A 09/20/2017   Procedure: ESOPHAGOGASTRODUODENOSCOPY (EGD) WITH PROPOFOL;  Surgeon: Lin Landsman, MD;  Location: [place];  Service: Gastroenterology;  Laterality: N/A;  . KNEE SURGERY Right    Family History: History reviewed. No pertinent family history. Family Psychiatric  History: see H&P Social History:  Social History   Substance and Sexual Activity  Alcohol Use Not  Currently     Social History   Substance and Sexual Activity  Drug Use Yes  . Types: Cocaine    Social History   Socioeconomic History  . Marital status: Single    Spouse name: Not on file  . Number of children: Not on file  . Years of education: Not on file  . Highest education level: Not on file  Occupational History  . Not on file  Tobacco Use  . Smoking status: Current Every Day Smoker    Packs/day: 0.50    Types: Cigarettes  . Smokeless tobacco: Never Used  Vaping Use  . Vaping Use: Never used  Substance and Sexual Activity  . Alcohol use: Not Currently  . Drug use: Yes    Types: Cocaine  . Sexual activity: Yes    Birth control/protection: Condom  Other Topics Concern  . Not on file  Social History Narrative  . Not on file   Social Determinants of Health   Financial Resource Strain:   . Difficulty of Paying Living Expenses: Not on file  Food Insecurity:   . Worried About Charity fundraiser in the Last Year: Not on file  . Ran Out of Food in the Last Year: Not on file  Transportation Needs:   . Lack of Transportation (Medical): Not on file  . Lack of Transportation (Non-Medical): Not on file  Physical Activity:   . Days of Exercise per Week: Not on file  . Minutes of Exercise per Session: Not on file  Stress:   . Feeling of Stress : Not on file  Social Connections:   . Frequency of Communication with Friends and Family: Not on file  . Frequency of Social Gatherings with  Friends and Family: Not on file  . Attends Religious Services: Not on file  . Active Member of Clubs or Organizations: Not on file  . Attends Archivist Meetings: Not on file  . Marital Status: Not on file   Additional Social History:                         Sleep: Fair  Appetite:  Good  Current Medications: Current Facility-Administered Medications  Medication Dose Route Frequency Provider Last Rate Last Admin  . acetaminophen (TYLENOL) tablet 650 mg  650  mg Oral Q6H PRN Clapacs, John T, MD      . alum & mag hydroxide-simeth (MAALOX/MYLANTA) 200-200-20 MG/5ML suspension 30 mL  30 mL Oral Q4H PRN Clapacs, John T, MD      . calcium carbonate (TUMS - dosed in mg elemental calcium) chewable tablet 200 mg of elemental calcium  1 tablet Oral TID PRN [name], MD   200 mg of elemental calcium at 01/29/20 1654  . citalopram (CELEXA) tablet 10 mg  10 mg Oral Daily Clapacs, [name], MD   10 mg at 01/31/20 0804  . famotidine (PEPCID) tablet 20 mg  20 mg Oral Q12H PRN Clapacs, John T, MD      . magnesium hydroxide (MILK OF MAGNESIA) suspension 30 mL  30 mL Oral Daily PRN Clapacs, John T, MD      . mirtazapine (REMERON) tablet 15 mg  15 mg Oral QHS PRN Clapacs, John T, MD   15 mg at 01/30/20 2223  . naproxen (NAPROSYN) tablet 500 mg  500 mg Oral BID WC Clapacs, [name], MD   500 mg at 01/31/20 0804  . pantoprazole (PROTONIX) EC tablet 40 mg  40 mg Oral Daily Clapacs, [name], MD   40 mg at 01/31/20 6269    Lab Results: No results found for this or any previous visit (from the past 48 hour(s)).  Blood Alcohol level:  Lab Results  Component Value Date   ETH <10 01/28/2020   ETH <10 48/54/6270    Metabolic Disorder Labs: No results found for: HGBA1C, MPG No results found for: PROLACTIN No results found for: CHOL, TRIG, HDL, CHOLHDL, VLDL, LDLCALC  Physical Findings: AIMS:  , ,  ,  ,    CIWA:    COWS:     Musculoskeletal: Strength & Muscle Tone: within normal limits Gait & Station: normal Patient leans: N/A  Psychiatric Specialty Exam: Physical Exam  Review of Systems  Blood pressure (!) 154/86, pulse (!) 48, temperature 97.7 F (36.5 C), temperature source Oral, resp. rate 18, height 6\' 1"  (1.854 m), weight 95.3 kg, SpO2 100 %.Body mass index is 27.71 kg/m.  General Appearance: Casual  Eye Contact:  Fair  Speech:  Normal Rate  Volume:  Normal  Mood:  Depressed  Affect:  Appropriate and Congruent  Thought Process:  Coherent, Goal  Directed and Linear  Orientation:  Full (Time, Place, and Person)  Thought Content:  Logical  Suicidal Thoughts:  No  Homicidal Thoughts:  No  Memory:  Immediate;   Fair Recent;   Fair  Judgement:  Fair  Insight:  Fair  Psychomotor Activity:  Normal  Concentration:  Concentration: Fair and Attention Span: Fair  Recall:  [employer] of Knowledge:  Fair  Language:  Fair  Akathisia:  No  Handed:  Right  AIMS (if indicated):     Assets:  Communication Skills Desire for Improvement  ADL's:  Intact  Cognition:  WNL  Sleep:  Number of Hours: 7     Treatment Plan Summary: Daily contact with patient to assess and evaluate symptoms and progress in treatment and Medication management   Patient is a 59 year old male with the above-stated past psychiatric history who is seen in follow-up.  Chart reviewed. Patient discussed with nursing. Patient reports partial mood improvement  - not suicidal; although he continues to report depressed mood mostly in settings of substance use and inability to maintain sobriety. Interested in inpatient program to stay away from drugs and alcohol.    Plan:  -continue inpatient psych admission; 15-minute checks; daily contact with patient to assess and evaluate symptoms and progress in treatment; psychoeducation.  -continue scheduled medications: . citalopram  10 mg Oral Daily  . naproxen  500 mg Oral BID WC  . pantoprazole  40 mg Oral Daily   -continue PRN medications.  acetaminophen, alum & mag hydroxide-simeth, calcium carbonate, famotidine, magnesium hydroxide, mirtazapine  -Pertinent Labs: no new labs ordered today    -Consults: No new consults placed since yesterday    -Disposition: Social worker will attempt to help patient get into rehab. All necessary aftercare will be arranged prior to discharge.  -  I certify that the patient does need, on a daily basis, active treatment furnished directly by or requiring the supervision of inpatient  psychiatric facility personnel.   Larita Fife, MD 01/31/2020, 11:38 AM

## 2020-01-31 NOTE — BHH Counselor (Signed)
[place] Group Notes: (Clinical Social Work)   01/31/2020      Type of Therapy:  Group Therapy   Participation Level:  Did Not Attend - was invited individually by Nurse/MHT and chose not to attend.   Raina Mina, LCSWA 01/31/2020  3:01 PM

## 2020-01-31 NOTE — Plan of Care (Signed)
  Problem: Education: Goal: Knowledge of [facility] Education information/materials will improve Outcome: Progressing   Problem: Health Behavior/Discharge Planning: Goal: Identification of resources available to assist in meeting health care needs will improve Outcome: Progressing Goal: Compliance with treatment plan for underlying cause of condition will improve Outcome: Progressing

## 2020-01-31 NOTE — BHH Counselor (Signed)
CSW attempted to complete patients PSA. Patient stated he guess he could complete it. When CSW walked further into the room patient stated he was tired and did not feel good. Patient asked CSW to come back in the afternoon.

## 2020-01-31 NOTE — Progress Notes (Addendum)
Patient came to office and reported right ear pain and yellow discharge. He states it has been going on for about two weeks and he was prescribed ear drops by outpatient Dr without relief. He requests ENT consult. Hospitalist consult ordered.  Cortisporin otic solution ordered for R ear.

## 2020-01-31 NOTE — Plan of Care (Signed)
Pt rates depression, anxiety and hopelessness all at 8/10. Pt denies SI, HI and AVH. Pt was educated on care plan and verbalizes understanding. Collier Bullock RN  Problem: Education: Goal: Freight forwarder Education information/materials will improve Outcome: Progressing   Problem: Health Behavior/Discharge Planning: Goal: Identification of resources available to assist in meeting health care needs will improve Outcome: Progressing Goal: Compliance with treatment plan for underlying cause of condition will improve Outcome: Progressing   Problem: Education: Goal: Utilization of techniques to improve thought processes will improve Outcome: Progressing Goal: Knowledge of the prescribed therapeutic regimen will improve Outcome: Progressing   Problem: Coping: Goal: Coping ability will improve Outcome: Progressing Goal: Will verbalize feelings Outcome: Progressing   Problem: Safety: Goal: Ability to disclose and discuss suicidal ideas will improve Outcome: Progressing Goal: Ability to identify and utilize support systems that promote safety will improve Outcome: Progressing   Problem: Self-Concept: Goal: Will verbalize positive feelings about self Outcome: Progressing Goal: Level of anxiety will decrease Outcome: Progressing

## 2020-01-31 NOTE — BHH Counselor (Signed)
CSW went back to complete  PSA with patient. CSW said patients name several times and patient did not wake up. Patient was snoring loudly.

## 2020-02-01 DIAGNOSIS — F332 Major depressive disorder, recurrent severe without psychotic features: Secondary | ICD-10-CM | POA: Diagnosis not present

## 2020-02-01 MED ORDER — HYDROXYZINE HCL 25 MG PO TABS
25.0000 mg | ORAL_TABLET | Freq: Three times a day (TID) | ORAL | Status: DC | PRN
  Administered 2020-02-01: 25 mg via ORAL
  Filled 2020-02-01: qty 1

## 2020-02-01 NOTE — Progress Notes (Signed)
[employer] MD Progress Note  02/01/2020 9:54 AM Samuel Molina  MRN:  [id number]  Samuel Molina is a 59 y.o. male who presents to the [facility] unit for treatment of depression in the context of relapse on drugs (cocaine).   Interval History Patient was seen today for re-evaluation.  Nursing reports no events overnight. The patient has no issues with performing ADLs.  Patient has been medication compliant.    SUBJECTIVE: On assessment patient reports his ear pain is better after initiation of eardrops. He continues to complain about hip pain, focused on pain medications. He reports feeling "depressed, but less", denies feeling suicidal or having any unsafe urges or plans. . Reports he slept somewhat last night. Denies side effects from antidepressant. He is interested in inpatient substance-use treatment due to history of multiple relapses.   Current suicidal/homicidal ideations: Denies Current auditory/visual hallucinations: Denies The patient reports no side effects from medications.    Labs: no new results for review.    Principal Problem: Severe recurrent major depression without psychotic features ([place]) Diagnosis: Principal Problem:   Severe recurrent major depression without psychotic features ([place]) Active Problems:   Cocaine abuse ([place])  Total Time spent with patient: 15 minutes  Past Psychiatric History: see H&P  Past Medical History:  Past Medical History:  Diagnosis Date  . GERD (gastroesophageal reflux disease)   . Obesity     Past Surgical History:  Procedure Laterality Date  . ESOPHAGOGASTRODUODENOSCOPY (EGD) WITH PROPOFOL N/A 09/20/2017   Procedure: ESOPHAGOGASTRODUODENOSCOPY (EGD) WITH PROPOFOL;  Surgeon: Lin Landsman, MD;  Location: [place];  Service: Gastroenterology;  Laterality: N/A;  . KNEE SURGERY Right    Family History: History reviewed. No pertinent family history. Family Psychiatric  History: see H&P Social History:  Social History   Substance  and Sexual Activity  Alcohol Use Not Currently     Social History   Substance and Sexual Activity  Drug Use Yes  . Types: Cocaine    Social History   Socioeconomic History  . Marital status: Single    Spouse name: Not on file  . Number of children: Not on file  . Years of education: Not on file  . Highest education level: Not on file  Occupational History  . Not on file  Tobacco Use  . Smoking status: Current Every Day Smoker    Packs/day: 0.50    Types: Cigarettes  . Smokeless tobacco: Never Used  Vaping Use  . Vaping Use: Never used  Substance and Sexual Activity  . Alcohol use: Not Currently  . Drug use: Yes    Types: Cocaine  . Sexual activity: Yes    Birth control/protection: Condom  Other Topics Concern  . Not on file  Social History Narrative  . Not on file   Social Determinants of Health   Financial Resource Strain:   . Difficulty of Paying Living Expenses: Not on file  Food Insecurity:   . Worried About Charity fundraiser in the Last Year: Not on file  . Ran Out of Food in the Last Year: Not on file  Transportation Needs:   . Lack of Transportation (Medical): Not on file  . Lack of Transportation (Non-Medical): Not on file  Physical Activity:   . Days of Exercise per Week: Not on file  . Minutes of Exercise per Session: Not on file  Stress:   . Feeling of Stress : Not on file  Social Connections:   . Frequency of Communication with Friends and Family:  Not on file  . Frequency of Social Gatherings with Friends and Family: Not on file  . Attends Religious Services: Not on file  . Active Member of Clubs or Organizations: Not on file  . Attends Archivist Meetings: Not on file  . Marital Status: Not on file   Additional Social History:                         Sleep: Fair  Appetite:  Good  Current Medications: Current Facility-Administered Medications  Medication Dose Route Frequency Provider Last Rate Last Admin  . alum &  mag hydroxide-simeth (MAALOX/MYLANTA) 200-200-20 MG/5ML suspension 30 mL  30 mL Oral Q4H PRN Clapacs, John T, MD      . calcium carbonate (TUMS - dosed in mg elemental calcium) chewable tablet 200 mg of elemental calcium  1 tablet Oral TID PRN [name], MD   200 mg of elemental calcium at 01/29/20 1654  . citalopram (CELEXA) tablet 10 mg  10 mg Oral Daily Clapacs, [name], MD   10 mg at 02/01/20 0918  . famotidine (PEPCID) tablet 20 mg  20 mg Oral Q12H PRN Clapacs, John T, MD      . hydrOXYzine (ATARAX/VISTARIL) tablet 25 mg  25 mg Oral TID PRN Larita Fife, MD      . ibuprofen (ADVIL) tablet 600 mg  600 mg Oral Q6H PRN Deloria Lair, NP   600 mg at 01/31/20 2157  . magnesium hydroxide (MILK OF MAGNESIA) suspension 30 mL  30 mL Oral Daily PRN Clapacs, John T, MD      . mirtazapine (REMERON) tablet 15 mg  15 mg Oral QHS PRN Clapacs, [name], MD   15 mg at 01/31/20 2136  . naproxen (NAPROSYN) tablet 500 mg  500 mg Oral BID WC Clapacs, [name], MD   500 mg at 02/01/20 2694  . NEOMYCIN-POLYMYXIN-HYDROCORTISONE (CORTISPORIN) OTIC (EAR) solution 3 drop  3 drop Right EAR Q6H Larita Fife, MD   3 drop at 02/01/20 0648  . pantoprazole (PROTONIX) EC tablet 40 mg  40 mg Oral Daily Clapacs, [name], MD   40 mg at 02/01/20 8546    Lab Results: No results found for this or any previous visit (from the past 48 hour(s)).  Blood Alcohol level:  Lab Results  Component Value Date   ETH <10 01/28/2020   ETH <10 27/06/5007    Metabolic Disorder Labs: No results found for: HGBA1C, MPG No results found for: PROLACTIN No results found for: CHOL, TRIG, HDL, CHOLHDL, VLDL, LDLCALC  Physical Findings: AIMS:  , ,  ,  ,    CIWA:    COWS:     Musculoskeletal: Strength & Muscle Tone: within normal limits Gait & Station: normal Patient leans: N/A  Psychiatric Specialty Exam: Physical Exam   Review of Systems   Blood pressure (!) 155/89, pulse (!) 55, temperature 97.7 F (36.5 C), temperature source  Oral, resp. rate 16, height 6\' 1"  (1.854 m), weight 95.3 kg, SpO2 100 %.Body mass index is 27.71 kg/m.  General Appearance: Casual  Eye Contact:  Fair  Speech:  Normal Rate  Volume:  Normal  Mood:  Depressed  Affect:  Appropriate and Congruent  Thought Process:  Coherent, Goal Directed and Linear  Orientation:  Full (Time, Place, and Person)  Thought Content:  Logical  Suicidal Thoughts:  No  Homicidal Thoughts:  No  Memory:  Immediate;   Fair Recent;  Fair  Judgement:  Fair  Insight:  Fair  Psychomotor Activity:  Normal  Concentration:  Concentration: Fair and Attention Span: Fair  Recall:  [employer] of Knowledge:  Fair  Language:  Fair  Akathisia:  No  Handed:  Right  AIMS (if indicated):     Assets:  Communication Skills Desire for Improvement  ADL's:  Intact  Cognition:  WNL  Sleep:  Number of Hours: 5.25     Treatment Plan Summary: Daily contact with patient to assess and evaluate symptoms and progress in treatment and Medication management   Patient is a 59 year old male with the above-stated past psychiatric history who is seen in follow-up.  Chart reviewed. Patient discussed with nursing. Patient reports partial mood improvement  - not suicidal; although he continues to report depressed mood mostly in settings of substance use and inability to maintain sobriety. Interested in inpatient program to stay away from drugs and alcohol.    Plan:  -continue inpatient psych admission; 15-minute checks; daily contact with patient to assess and evaluate symptoms and progress in treatment; psychoeducation.  -continue scheduled medications: . citalopram  10 mg Oral Daily  . naproxen  500 mg Oral BID WC  . NEOMYCIN-POLYMYXIN-HYDROCORTISONE  3 drop Right EAR Q6H  . pantoprazole  40 mg Oral Daily   -continue PRN medications.  alum & mag hydroxide-simeth, calcium carbonate, famotidine, hydrOXYzine, ibuprofen, magnesium hydroxide, mirtazapine  -Pertinent Labs: no new labs  ordered today    -Consults: No new consults placed since yesterday    -Disposition: Social worker will attempt to help patient get into rehab. All necessary aftercare will be arranged prior to discharge.  -  I certify that the patient does need, on a daily basis, active treatment furnished directly by or requiring the supervision of inpatient psychiatric facility personnel.   Larita Fife, MD 02/01/2020, 9:54 AM

## 2020-02-01 NOTE — BHH Group Notes (Signed)
LCSW Group Therapy Note  02/01/2020 1:09-2:11 PM   Type of Therapy and Topic:  Group Therapy: Avoiding Self-Sabotaging and Enabling Behaviors  Participation Level:  Active   Description of Group:   In this group, patients will learn how to identify obstacles, self-sabotaging and enabling behaviors, as well as: what are they, why do we do them and what needs these behaviors meet. Discuss unhealthy relationships and how to have positive healthy boundaries with those that sabotage and enable. Explore aspects of self-sabotage and enabling in yourself and how to limit these self-destructive behaviors in everyday life.   Therapeutic Goals: 1. Patient will identify one obstacle that relates to self-sabotage and enabling behaviors 2. Patient will identify one personal self-sabotaging or enabling behavior they did prior to admission 3. Patient will state a plan to change the above identified behavior 4. Patient will demonstrate ability to communicate their needs through discussion and/or role play.   Summary of Patient Progress: Patient came into group late but was able to share a current obstacle that he is going through. Patient stated that he would like to work on making more intellectual decisions instead of emotional decisions. Patient spoke about coming to the hospital voluntarily and spoke about using cocaine again. Patient stated that he would like to go to a drug treatment facility. Patient talked about him not being ready to give up using and that he will do it on his time. Patient also stated when he tells someone no that he means no and no one can convince him to do what they want. Patient stated he is direct and has no trouble communicating and getting what he needs. Patient was very vocal during group and gave other patients in group advice about advocating for themselves. Patient stated the change he wants to make is going to treatment for drug usage and thinking before he reacts.       Therapeutic Modalities:   Cognitive Behavioral Therapy Person-Centered Therapy Motivational [place], [place]   02/01/2020
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
## 2020-02-01 NOTE — Plan of Care (Signed)
  Problem: Education: Goal: Knowledge of [facility] Education information/materials will improve Outcome: Progressing   Problem: Health Behavior/Discharge Planning: Goal: Identification of resources available to assist in meeting health care needs will improve Outcome: Progressing Goal: Compliance with treatment plan for underlying cause of condition will improve Outcome: Progressing   Problem: Education: Goal: Utilization of techniques to improve thought processes will improve Outcome: Progressing Goal: Knowledge of the prescribed therapeutic regimen will improve Outcome: Progressing

## 2020-02-01 NOTE — BHH Suicide Risk Assessment (Signed)
Samuel Molina INPATIENT:  Family/Significant Other Suicide Prevention Education  Suicide Prevention Education:  Patient Refusal for Family/Significant Other Suicide Prevention Education: The patient Samuel Molina has refused to provide written consent for family/significant other to be provided Family/Significant Other Suicide Prevention Education during admission and/or prior to discharge.  Physician notified.  Coren Crownover 02/01/2020, 10:12 AM

## 2020-02-01 NOTE — Progress Notes (Signed)
Patient has been complaining of ear pain. Called NP and got an order for Ibuprofen. Patient refused to take it and got angry because he said his doctor outside the hospital had him on oxycodone and the NP did not have the right to change it. Patient advised the NP was not going to be ordering oxycontin and that he needed to at least try the ibuprofen. Pt agreed to take the ibuprofen. Denies SI, HI and AVH

## 2020-02-01 NOTE — Progress Notes (Signed)
Patient denies SI, HI and AVH this shift. Patient reported that he felt anxious due to having an increased blood pressure, but patient has been trending high since his admission.   Assess patient for safety, offer medications as prescribed, engage patient in 1:1 staff talks.   Patient able to contract for safety.

## 2020-02-02 MED ORDER — PANTOPRAZOLE SODIUM 40 MG PO TBEC
40.0000 mg | DELAYED_RELEASE_TABLET | Freq: Once | ORAL | Status: AC
  Administered 2020-02-02: 40 mg via ORAL
  Filled 2020-02-02: qty 1

## 2020-02-02 MED ORDER — GABAPENTIN 100 MG PO CAPS
100.0000 mg | ORAL_CAPSULE | Freq: Three times a day (TID) | ORAL | Status: DC
  Administered 2020-02-02 – 2020-02-03 (×3): 100 mg via ORAL
  Filled 2020-02-02 (×4): qty 1

## 2020-02-02 MED ORDER — MIRTAZAPINE 15 MG PO TABS
30.0000 mg | ORAL_TABLET | Freq: Every day | ORAL | Status: DC
  Administered 2020-02-02: 30 mg via ORAL
  Filled 2020-02-02: qty 2

## 2020-02-02 MED ORDER — PANTOPRAZOLE SODIUM 40 MG PO TBEC
80.0000 mg | DELAYED_RELEASE_TABLET | Freq: Every day | ORAL | Status: DC
  Administered 2020-02-03: 80 mg via ORAL
  Filled 2020-02-02: qty 2

## 2020-02-02 MED ORDER — GABAPENTIN 100 MG PO CAPS
100.0000 mg | ORAL_CAPSULE | Freq: Once | ORAL | Status: AC
  Administered 2020-02-02: 100 mg via ORAL
  Filled 2020-02-02: qty 1

## 2020-02-02 NOTE — BHH Group Notes (Signed)
[place] Group Notes:  (Nursing/MHT/Case Management/Adjunct)  Date:  02/02/2020  Time:  9:04 AM  Type of Therapy:  [place]  Participation Level:  Active  Participation Quality:  Appropriate  Affect:  Appropriate  Cognitive:  Appropriate  Insight:  Appropriate  Engagement in Group:  Engaged  Modes of Intervention:  Discussion and Education  Summary of Progress/Problems:  Charna Busman 02/02/2020, 9:04 AM

## 2020-02-02 NOTE — Plan of Care (Signed)
  Problem: Education: Goal: Knowledge of [facility] Education information/materials will improve Outcome: Progressing   Problem: Health Behavior/Discharge Planning: Goal: Identification of resources available to assist in meeting health care needs will improve Outcome: Progressing Goal: Compliance with treatment plan for underlying cause of condition will improve Outcome: Progressing   Problem: Education: Goal: Utilization of techniques to improve thought processes will improve Outcome: Progressing Goal: Knowledge of the prescribed therapeutic regimen will improve Outcome: Progressing

## 2020-02-02 NOTE — Progress Notes (Signed)
Patient is laughing and joking with staff and peers. Appears to be having a good time on the unit. Refused his ear drops. Denies SI, HI and AVH.

## 2020-02-02 NOTE — Progress Notes (Signed)
Recreation Therapy Notes  Date: 02/02/2020  Time: 9:30 am   Location: Craft room     Behavioral response: N/A   Intervention Topic: Self-care   Discussion/Intervention: Patient did not attend group.   Clinical Observations/Feedback:  Patient did not attend group.   Rayhan Groleau LRT/CTRS         Ramaj Frangos 02/02/2020 12:17 PM

## 2020-02-02 NOTE — Plan of Care (Signed)
Patient stated this morning that he is not ready for discharge " there is too much going on." Patient did not specify other than his somatic symptoms.Visible in the milieu,socializing with peers and staff.Denies SI,HI and AVH.Did not attend groups.Compliant with medications.Appetite and energy level good.Support and encouragement given.

## 2020-02-02 NOTE — Progress Notes (Signed)
[facility] MD Progress Note  02/02/2020 11:59 AM Samuel Molina  MRN:  [id number]   Subjective:  Samuel Molina was seen at bedside today. He has numerous medical complaints today. He has been experiencing severe acid reflux and hiccups since admission despite Protonix, pepcid, and tums. He states he had a workup down outpatient complete with endoscopy that ruled out peptic ulcers. Will increase Protonix today to try and better control his symptoms. He also continues to focus on the pain in his hip. He notes that today the pains is radiating from his hip down the front of his thigh and into his shine. He continues to request narcotic pain medications. Explained that this was not allowed in inpatient or outpatient substance abuse treatments. Will trial gabapentin 100 mg TID to address neuropathic pain. He has a follow-up appointment with orthopedics on Oct 17th. Lastly, he states that he would not like to be given hydroxyine because it made him feel very off-balance, and "out-of-it." Will discontinue this. Also discussed increasing Remeron to assist with depression, anxiety, and appetite. He is agreeable tot his plan. Samuel Molina also notes that his ex-wife, son, and daughter are a tremendous support to him, and have been searching for substance abuse treatment facilities that will accept his insurance. Our Education officer, museum also continues to search for placement while he is in the hospital. He currently denies suicidal ideations, homicidal ideations, visual hallucinations, or auditory hallucinations.   Principal Problem: Severe recurrent major depression without psychotic features ([place]) Diagnosis: Principal Problem:   Severe recurrent major depression without psychotic features ([place]) Active Problems:   Gastroesophageal reflux disease with esophagitis   Cocaine abuse (HCC)   Hip pain  Total Time spent with patient: 30 minutes  Past Psychiatric History: Patient was in the hospital recently for identical  complaints.  He was referred to the alcohol and drug abuse treatment center at that time but did not stay long enough for referral.  Other than that he appears to have no past inpatient or outpatient treatment.  Past Medical History:  Past Medical History:  Diagnosis Date  . GERD (gastroesophageal reflux disease)   . Obesity     Past Surgical History:  Procedure Laterality Date  . ESOPHAGOGASTRODUODENOSCOPY (EGD) WITH PROPOFOL N/A 09/20/2017   Procedure: ESOPHAGOGASTRODUODENOSCOPY (EGD) WITH PROPOFOL;  Surgeon: Lin Landsman, MD;  Location: [place];  Service: Gastroenterology;  Laterality: N/A;  . KNEE SURGERY Right    Family History: History reviewed. No pertinent family history. Family Psychiatric  History: denies Social History:  Social History   Substance and Sexual Activity  Alcohol Use Not Currently     Social History   Substance and Sexual Activity  Drug Use Yes  . Types: Cocaine    Social History   Socioeconomic History  . Marital status: Single    Spouse name: Not on file  . Number of children: Not on file  . Years of education: Not on file  . Highest education level: Not on file  Occupational History  . Not on file  Tobacco Use  . Smoking status: Current Every Day Smoker    Packs/day: 0.50    Types: Cigarettes  . Smokeless tobacco: Never Used  Vaping Use  . Vaping Use: Never used  Substance and Sexual Activity  . Alcohol use: Not Currently  . Drug use: Yes    Types: Cocaine  . Sexual activity: Yes    Birth control/protection: Condom  Other Topics Concern  . Not on file  Social History  Narrative  . Not on file   Social Determinants of Health   Financial Resource Strain:   . Difficulty of Paying Living Expenses: Not on file  Food Insecurity:   . Worried About Charity fundraiser in the Last Year: Not on file  . Ran Out of Food in the Last Year: Not on file  Transportation Needs:   . Lack of Transportation (Medical): Not on file  .  Lack of Transportation (Non-Medical): Not on file  Physical Activity:   . Days of Exercise per Week: Not on file  . Minutes of Exercise per Session: Not on file  Stress:   . Feeling of Stress : Not on file  Social Connections:   . Frequency of Communication with Friends and Family: Not on file  . Frequency of Social Gatherings with Friends and Family: Not on file  . Attends Religious Services: Not on file  . Active Member of Clubs or Organizations: Not on file  . Attends Archivist Meetings: Not on file  . Marital Status: Not on file   Additional Social History:                         Sleep: Poor  Appetite:  Poor  Current Medications: Current Facility-Administered Medications  Medication Dose Route Frequency Provider Last Rate Last Admin  . alum & mag hydroxide-simeth (MAALOX/MYLANTA) 200-200-20 MG/5ML suspension 30 mL  30 mL Oral Q4H PRN Clapacs, John T, MD      . calcium carbonate (TUMS - dosed in mg elemental calcium) chewable tablet 200 mg of elemental calcium  1 tablet Oral TID PRN [name], MD   200 mg of elemental calcium at 01/29/20 1654  . citalopram (CELEXA) tablet 10 mg  10 mg Oral Daily Clapacs, John T, MD   10 mg at 02/02/20 0810  . famotidine (PEPCID) tablet 20 mg  20 mg Oral Q12H PRN Clapacs, John T, MD      . gabapentin (NEURONTIN) capsule 100 mg  100 mg Oral TID [name], MD      . ibuprofen (ADVIL) tablet 600 mg  600 mg Oral Q6H PRN Deloria Lair, NP   600 mg at 01/31/20 2157  . magnesium hydroxide (MILK OF MAGNESIA) suspension 30 mL  30 mL Oral Daily PRN Clapacs, John T, MD      . mirtazapine (REMERON) tablet 30 mg  30 mg Oral QHS [name], MD      . naproxen (NAPROSYN) tablet 500 mg  500 mg Oral BID WC Clapacs, [name], MD   500 mg at 02/02/20 0809  . NEOMYCIN-POLYMYXIN-HYDROCORTISONE (CORTISPORIN) OTIC (EAR) solution 3 drop  3 drop Right EAR Q6H Larita Fife, MD   3 drop at 02/01/20 0648  . [START ON 02/03/2020]  pantoprazole (PROTONIX) EC tablet 80 mg  80 mg Oral Daily [name], MD        Lab Results: No results found for this or any previous visit (from the past 48 hour(s)).  Blood Alcohol level:  Lab Results  Component Value Date   ETH <10 01/28/2020   ETH <10 54/65/6812    Metabolic Disorder Labs: No results found for: HGBA1C, MPG No results found for: PROLACTIN No results found for: CHOL, TRIG, HDL, CHOLHDL, VLDL, LDLCALC  Physical Findings: AIMS:  , ,  ,  ,    CIWA:    COWS:     Musculoskeletal: Strength & Muscle Tone:  within normal limits Gait & Station: normal Patient leans: N/A  Psychiatric Specialty Exam: Physical Exam Constitutional:      Appearance: Normal appearance.  HENT:     Head: Normocephalic and atraumatic.     Right Ear: External ear normal.     Left Ear: External ear normal.     Nose: Nose normal.     Mouth/Throat:     Mouth: Mucous membranes are moist.     Pharynx: Oropharynx is clear.  Eyes:     Extraocular Movements: Extraocular movements intact.     Conjunctiva/sclera: Conjunctivae normal.     Pupils: Pupils are equal, round, and reactive to light.  Cardiovascular:     Rate and Rhythm: Normal rate.     Pulses: Normal pulses.  Pulmonary:     Effort: Pulmonary effort is normal.     Breath sounds: No wheezing.  Abdominal:     General: Abdomen is flat. There is no distension.  Musculoskeletal:        General: Tenderness present.     Cervical back: Normal range of motion. No rigidity.     Right lower leg: No edema.     Left lower leg: No edema.  Skin:    General: Skin is warm and dry.  Neurological:     General: No focal deficit present.     Mental Status: He is alert and oriented to person, place, and time.  Psychiatric:        Behavior: Behavior normal.        Judgment: Judgment normal.     Review of Systems  Constitutional: Positive for appetite change and fatigue.  HENT: Negative for rhinorrhea and sore throat.   Eyes:  Negative for photophobia and visual disturbance.  Respiratory: Negative for cough and shortness of breath.   Cardiovascular: Negative for chest pain and palpitations.  Gastrointestinal: Negative for constipation, diarrhea and nausea.  Endocrine: Negative for cold intolerance and heat intolerance.  Genitourinary: Negative for difficulty urinating and dysuria.  Musculoskeletal: Positive for arthralgias, back pain, joint swelling and myalgias.  Skin: Negative for rash and wound.  Allergic/Immunologic: Negative for food allergies and immunocompromised state.  Neurological: Negative for dizziness and headaches.  Hematological: Negative for adenopathy. Does not bruise/bleed easily.  Psychiatric/Behavioral: Negative for hallucinations and suicidal ideas.    Blood pressure 125/79, pulse (!) 53, temperature 97.8 F (36.6 C), temperature source Oral, resp. rate 18, height 6\' 1"  (1.854 m), weight 95.3 kg, SpO2 100 %.Body mass index is 27.71 kg/m.  General Appearance: Casual  Eye Contact:  Good  Speech:  Normal Rate  Volume:  Normal  Mood:  Dysphoric  Affect:  Congruent  Thought Process:  Coherent and Goal Directed  Orientation:  Full (Time, Place, and Person)  Thought Content:  Logical  Suicidal Thoughts:  No  Homicidal Thoughts:  No  Memory:  Immediate;   Fair Recent;   Fair Remote;   Fair  Judgement:  Intact  Insight:  Fair  Psychomotor Activity:  Normal  Concentration:  Concentration: Fair and Attention Span: Fair  Recall:  [employer] of Knowledge:  Fair  Language:  Fair  Akathisia:  Negative  Handed:  Right  AIMS (if indicated):     Assets:  Communication Skills Desire for Improvement Housing Social Support Vocational/Educational  ADL's:  Intact  Cognition:  WNL  Sleep:  Number of Hours: 5     Treatment Plan Summary: Daily contact with patient to assess and evaluate symptoms and progress in treatment, Medication  management and Plan increase protonix to 80 mg daily, start  gabapentin 100 mg TID to address neuropathic pain, increase Remeron to 30 mg QHS to address anxiety and depression. DC vistaril due to side effect of dizziness.   [name], MD 02/02/2020, 11:59 AM

## 2020-02-02 NOTE — BHH Counselor (Signed)
CSW spoke with Shayla at [facility].  She reports that patient is DENIED due to legal history.  Assunta Curtis, MSW, LCSW 02/02/2020 3:17 PM

## 2020-02-03 DIAGNOSIS — F332 Major depressive disorder, recurrent severe without psychotic features: Secondary | ICD-10-CM | POA: Diagnosis not present

## 2020-02-03 MED ORDER — MIRTAZAPINE 30 MG PO TABS
30.0000 mg | ORAL_TABLET | Freq: Every day | ORAL | 1 refills | Status: DC
Start: 2020-02-03 — End: 2020-02-24

## 2020-02-03 MED ORDER — GABAPENTIN 300 MG PO CAPS
300.0000 mg | ORAL_CAPSULE | Freq: Three times a day (TID) | ORAL | Status: DC
  Administered 2020-02-03: 300 mg via ORAL
  Filled 2020-02-03: qty 3

## 2020-02-03 MED ORDER — GABAPENTIN 300 MG PO CAPS
300.0000 mg | ORAL_CAPSULE | Freq: Three times a day (TID) | ORAL | 1 refills | Status: DC
Start: 2020-02-03 — End: 2020-02-24

## 2020-02-03 MED ORDER — NAPROXEN 500 MG PO TABS
500.0000 mg | ORAL_TABLET | Freq: Two times a day (BID) | ORAL | 0 refills | Status: DC
Start: 2020-02-03 — End: 2020-03-30

## 2020-02-03 MED ORDER — OXYCODONE HCL 5 MG PO TABS
10.0000 mg | ORAL_TABLET | Freq: Two times a day (BID) | ORAL | Status: DC | PRN
  Administered 2020-02-03: 10 mg via ORAL
  Filled 2020-02-03: qty 2

## 2020-02-03 MED ORDER — CITALOPRAM HYDROBROMIDE 10 MG PO TABS
10.0000 mg | ORAL_TABLET | Freq: Every day | ORAL | 1 refills | Status: DC
Start: 2020-02-03 — End: 2020-02-23

## 2020-02-03 MED ORDER — PANTOPRAZOLE SODIUM 40 MG PO TBEC
80.0000 mg | DELAYED_RELEASE_TABLET | Freq: Every day | ORAL | 1 refills | Status: DC
Start: 2020-02-04 — End: 2020-05-26

## 2020-02-03 NOTE — Progress Notes (Signed)
Patient is cooperative with treatment. Denies SI,HI and AVH. Compliant with medications. No behavioral issues to report on shift at this time. Patient seemed to sleep well through out night.

## 2020-02-03 NOTE — Progress Notes (Signed)
D: Pt alert and oriented. Pt denies experiencing any pain, SI/HI, or AVH at this time. Pt reports he will be able to keep himself safe when he returns home.   A: Pt received discharge and medication education/information. Pt belongings were returned and signed for at this time to include printed prescriptions.   R: Pt verbalized understanding of discharge and medication education/information.  Pt escorted by staff to medical mall front lobby where pt was picked up by family.

## 2020-02-03 NOTE — BHH Suicide Risk Assessment (Signed)
[facility] Discharge Suicide Risk Assessment   Principal Problem: Severe recurrent major depression without psychotic features [facility]) Discharge Diagnoses: Principal Problem:   Severe recurrent major depression without psychotic features ([place]) Active Problems:   Gastroesophageal reflux disease with esophagitis   Cocaine abuse ([place])   Hip pain   Total Time spent with patient: 30 minutes  Musculoskeletal: Strength & Muscle Tone: within normal limits Gait & Station: normal Patient leans: N/A  Psychiatric Specialty Exam: Review of Systems  Constitutional: Negative.   HENT: Negative.   Eyes: Negative.   Respiratory: Negative.   Cardiovascular: Negative.   Gastrointestinal: Negative.   Musculoskeletal: Negative.   Skin: Negative.   Neurological: Negative.   Psychiatric/Behavioral: Negative.     Blood pressure (!) 178/88, pulse 60, temperature 97.7 F (36.5 C), temperature source Oral, resp. rate 16, height 6\' 1"  (1.854 m), weight 95.3 kg, SpO2 100 %.Body mass index is 27.71 kg/m.  General Appearance: Casual  Eye Contact::  Good  Speech:  Clear and [id number]  Volume:  Normal  Mood:  Euthymic  Affect:  Congruent  Thought Process:  Goal Directed  Orientation:  Full (Time, Place, and Person)  Thought Content:  Logical  Suicidal Thoughts:  No  Homicidal Thoughts:  No  Memory:  Immediate;   Fair Recent;   Fair Remote;   Fair  Judgement:  Fair  Insight:  Fair  Psychomotor Activity:  Normal  Concentration:  Fair  Recall:  [employer] of [place]  Language: Fair  Akathisia:  No  Handed:  Right  AIMS (if indicated):     Assets:  Desire for Improvement Housing Physical Health  Sleep:  Number of Hours: 5  Cognition: WNL  ADL's:  Intact   Mental Status Per Nursing Assessment::   On Admission:  NA  Demographic Factors:  Male  Loss Factors: NA  Historical Factors: Impulsivity  Risk Reduction Factors:   Living with another person, especially a relative and  Positive social support  Continued Clinical Symptoms:  Depression:   Impulsivity Alcohol/Substance Abuse/Dependencies  Cognitive Features That Contribute To Risk:  None    Suicide Risk:  Minimal: No identifiable suicidal ideation.  Patients presenting with no risk factors but with morbid ruminations; may be classified as minimal risk based on the severity of the depressive symptoms   Follow-up [place] Follow up.   Specialty: Addiction Medicine Contact information: [address] [place] [address] [phone]               Plan Of Care/Follow-up recommendations:  Activity:  Activity as tolerated Diet:  Regular diet Other:  Strongly encourage outpatient mental health follow-up as well as substance abuse treatment  Alethia Berthold, MD 02/03/2020, 12:28 PM

## 2020-02-03 NOTE — Discharge Summary (Signed)
Physician Discharge Summary Note  Patient:  Samuel Molina is an 59 y.o., male MRN:  [id number] DOB:  12/03/1960 Patient phone:  [phone] (home)  Patient address:   [place] [address],  Total Time spent with patient: 30 minutes  Date of Admission:  01/29/2020 Date of Discharge: 02/03/2020  Reason for Admission: Patient was admitted because of return of multiple symptoms of depression with reports of passive suicidal ideation and relapse into drug abuse  Principal Problem: Severe recurrent major depression without psychotic features [facility]) Discharge Diagnoses: Principal Problem:   Severe recurrent major depression without psychotic features ([place]) Active Problems:   Gastroesophageal reflux disease with esophagitis   Cocaine abuse ([place])   Hip pain   Past Psychiatric History: Recent admission which he had been discharged from after impulsively requesting release.  No previous suicide attempts.  No previous experience with substance abuse treatment  Past Medical History:  Past Medical History:  Diagnosis Date  . GERD (gastroesophageal reflux disease)   . Obesity     Past Surgical History:  Procedure Laterality Date  . ESOPHAGOGASTRODUODENOSCOPY (EGD) WITH PROPOFOL N/A 09/20/2017   Procedure: ESOPHAGOGASTRODUODENOSCOPY (EGD) WITH PROPOFOL;  Surgeon: Lin Landsman, MD;  Location: [place];  Service: Gastroenterology;  Laterality: N/A;  . KNEE SURGERY Right    Family History: History reviewed. No pertinent family history. Family Psychiatric  History: See previous Social History:  Social History   Substance and Sexual Activity  Alcohol Use Not Currently     Social History   Substance and Sexual Activity  Drug Use Yes  . Types: Cocaine    Social History   Socioeconomic History  . Marital status: Single    Spouse name: Not on file  . Number of children: Not on file  . Years of education: Not on file  . Highest education level:  Not on file  Occupational History  . Not on file  Tobacco Use  . Smoking status: Current Every Day Smoker    Packs/day: 0.50    Types: Cigarettes  . Smokeless tobacco: Never Used  Vaping Use  . Vaping Use: Never used  Substance and Sexual Activity  . Alcohol use: Not Currently  . Drug use: Yes    Types: Cocaine  . Sexual activity: Yes    Birth control/protection: Condom  Other Topics Concern  . Not on file  Social History Narrative  . Not on file   Social Determinants of Health   Financial Resource Strain:   . Difficulty of Paying Living Expenses: Not on file  Food Insecurity:   . Worried About Charity fundraiser in the Last Year: Not on file  . Ran Out of Food in the Last Year: Not on file  Transportation Needs:   . Lack of Transportation (Medical): Not on file  . Lack of Transportation (Non-Medical): Not on file  Physical Activity:   . Days of Exercise per Week: Not on file  . Minutes of Exercise per Session: Not on file  Stress:   . Feeling of Stress : Not on file  Social Connections:   . Frequency of Communication with Friends and Family: Not on file  . Frequency of Social Gatherings with Friends and Family: Not on file  . Attends Religious Services: Not on file  . Active Member of Clubs or Organizations: Not on file  . Attends Archivist Meetings: Not on file  . Marital Status: Not on file    Hospital Course: 15-minute  checks continued.  Patient showed no dangerous behavior.  Participated appropriately in individual and group assessment and activities.  Continued citalopram and was started on mirtazapine for depression and sleep.  Patient was cooperative with efforts to try to refer him to inpatient substance abuse treatment.  His focus as the hospitalization went on became more on his physical pain especially his leg pain.  On the day of discharge however he abruptly announced that his son had come into town from [place] and would be taking him back to  [place] to live.  Patient said he was very happy about this.  Completely denied suicidal thoughts.  Michela Pitcher he felt very confident about engaging in outpatient mental health and substance abuse treatment.  At this point he does not meet commitment criteria and there would be no benefit in forcibly trying to make him stay in the hospital.  He has been counseled about the importance of trying to really engage in outpatient substance abuse and mental health treatment.  Prescriptions will be provided and he will be discharged today.  Physical Findings: AIMS:  , ,  ,  ,    CIWA:    COWS:     Musculoskeletal: Strength & Muscle Tone: within normal limits Gait & Station: normal Patient leans: N/A  Psychiatric Specialty Exam: Physical Exam Vitals and nursing note reviewed.  Constitutional:      Appearance: He is well-developed.  HENT:     Head: Normocephalic and atraumatic.  Eyes:     Conjunctiva/sclera: Conjunctivae normal.     Pupils: Pupils are equal, round, and reactive to light.  Cardiovascular:     Heart sounds: Normal heart sounds.  Pulmonary:     Effort: Pulmonary effort is normal.  Abdominal:     Palpations: Abdomen is soft.  Musculoskeletal:        General: Normal range of motion.     Cervical back: Normal range of motion.  Skin:    General: Skin is warm and dry.  Neurological:     General: No focal deficit present.     Mental Status: He is alert.  Psychiatric:        Mood and Affect: Mood normal.     Review of Systems  Constitutional: Negative.   HENT: Negative.   Eyes: Negative.   Respiratory: Negative.   Cardiovascular: Negative.   Gastrointestinal: Negative.   Musculoskeletal: Negative.   Skin: Negative.   Neurological: Negative.   Psychiatric/Behavioral: Negative.     Blood pressure (!) 178/88, pulse 60, temperature 97.7 F (36.5 C), temperature source Oral, resp. rate 16, height 6\' 1"  (1.854 m), weight 95.3 kg, SpO2 100 %.Body mass index is 27.71 kg/m.   General Appearance: Casual  Eye Contact:  Good  Speech:  Clear and Coherent  Volume:  Normal  Mood:  Euthymic  Affect:  Congruent  Thought Process:  Coherent  Orientation:  Full (Time, Place, and Person)  Thought Content:  Logical  Suicidal Thoughts:  No  Homicidal Thoughts:  No  Memory:  Immediate;   Fair Recent;   Fair Remote;   Fair  Judgement:  Fair  Insight:  Fair  Psychomotor Activity:  Normal  Concentration:  Concentration: Fair  Recall:  [employer] of Knowledge:  Fair  Language:  Fair  Akathisia:  No  Handed:  Right  AIMS (if indicated):     Assets:  Desire for Improvement Physical Health Resilience  ADL's:  Intact  Cognition:  WNL  Sleep:  Number of Hours: 5  Have you used any form of tobacco in the last 30 days? (Cigarettes, Smokeless Tobacco, Cigars, and/or Pipes): Yes  Has this patient used any form of tobacco in the last 30 days? (Cigarettes, Smokeless Tobacco, Cigars, and/or Pipes) Yes, No  Blood Alcohol level:  Lab Results  Component Value Date   ETH <10 01/28/2020   ETH <10 26/94/8546    Metabolic Disorder Labs:  No results found for: HGBA1C, MPG No results found for: PROLACTIN No results found for: CHOL, TRIG, HDL, CHOLHDL, VLDL, LDLCALC  See Psychiatric Specialty Exam and Suicide Risk Assessment completed by Attending Physician prior to discharge.  Discharge destination:  Home  Is patient on multiple antipsychotic therapies at discharge:  No   Has Patient had three or more failed trials of antipsychotic monotherapy by history:  No  Recommended Plan for Multiple Antipsychotic Therapies: NA  Discharge Instructions    Diet - low sodium heart healthy   Complete by: As directed    Increase activity slowly   Complete by: As directed      Allergies as of 02/03/2020      Reactions   Acetaminophen Rash, Itching, Nausea And Vomiting      Medication List    STOP taking these medications   Oxycodone HCl 10 MG Tabs     TAKE these  medications     Indication  citalopram 10 MG tablet Commonly known as: CELEXA Take 1 tablet (10 mg total) by mouth daily.  Indication: Depression   gabapentin 300 MG capsule Commonly known as: NEURONTIN Take 1 capsule (300 mg total) by mouth 3 (three) times daily.  Indication: Neuropathic Pain, Arthritic pain   mirtazapine 30 MG tablet Commonly known as: REMERON Take 1 tablet (30 mg total) by mouth at bedtime.  Indication: Major Depressive Disorder   naproxen 500 MG tablet Commonly known as: NAPROSYN Take 1 tablet (500 mg total) by mouth 2 (two) times daily with a meal.  Indication: Joint Damage causing Pain and Loss of Function   pantoprazole 40 MG tablet Commonly known as: PROTONIX Take 2 tablets (80 mg total) by mouth daily. Start taking on: February 04, 2020  Indication: Gastroesophageal Reflux Disease       Follow-up [place] Follow up.   Specialty: Addiction Medicine Contact information: [place] [address] [phone]               Follow-up recommendations:  Activity:  Activity as tolerated Diet:  Regular diet Other:  Outpatient mental health follow-up  Comments: Prescriptions provided at discharge  Signed: Alethia Berthold, MD 02/03/2020, 12:32 PM

## 2020-02-03 NOTE — Progress Notes (Signed)
D: Pt alert and oriented. Pt rates depression 1/10, hopelessness 1/10, and anxiety 8/10. Pt reports energy level as low and concentration as being good. Pt reports sleep last night as being poor. Pt did receive medications for sleep and did not find them helpful. Pt reports experiencing 10/10 left hip pain. Pt denies experiencing any SI/HI, or AVH at this time.   Pt reports this morning during medication pass that he is eager to leave today because they're unable to get him into inpt treatment anytime soon. Pt stated there are no beds open right now, so he might as well go home.  A: Scheduled medications administered to pt, per MD orders. Support and encouragement provided. Frequent verbal contact made. Routine safety checks conducted q15 minutes.   R: No adverse drug reactions noted. Pt verbally contracts for safety at this time. Pt complaint with medications and treatment plan. Pt interacts well with others on the unit. Pt remains safe at this time. Will continue to monitor.

## 2020-02-03 NOTE — Progress Notes (Signed)
  [facility] Adult Case Management Discharge Plan :  Will you be returning to the same living situation after discharge:  No. Patient endorses plans to travel out of state after discharge. At discharge, do you have transportation home?: Yes,  reports his sister will be picking him up.  Do you have the ability to pay for your medications: No.  Release of information consent forms completed and in the chart;  Patient's signature needed at discharge.  Patient to Follow up at:  Follow-up Information    [facility] Follow up.   Why: Your referral has been sent. Please follow up.  Contact information: [place], [place] [address] Phone: [phone] Fax: [phone]              Next level of care provider has access to [place] and Suicide Prevention discussed: Yes,  completed with patient.   Have you used any form of tobacco in the last 30 days? (Cigarettes, Smokeless Tobacco, Cigars, and/or Pipes): Yes  Has patient been referred to the Quitline?: Patient refused referral  Patient has been referred for addiction treatment: Yes  [name], LCSW 02/03/2020, 1:40 PM

## 2020-02-03 NOTE — Progress Notes (Signed)
Recreation Therapy Notes  INPATIENT RECREATION TR PLAN  Patient Details Name: Samuel Molina MRN: [id number] DOB: November 03, 1960 Today's Date: 02/03/2020  Rec Therapy Plan Is patient appropriate for Therapeutic Recreation?: Yes Treatment times per week: at least 3 Estimated Length of Stay: 5-7 Days TR Treatment/Interventions: Group participation (Comment)  Discharge Criteria Pt will be discharged from therapy if:: Discharged Treatment plan/goals/alternatives discussed and agreed upon by:: Patient/family  Discharge Summary Short term goals set: Patient will engage in groups without prompting or encouragement from LRT x3 group sessions within 5 recreation therapy group sessions Short term goals met: Not met Reason goals not met: Patient spent most of his time in his room Therapeutic equipment acquired: N/A Reason patient discharged from therapy: Discharge from hospital Pt/family agrees with progress & goals achieved: Yes Date patient discharged from therapy: 02/03/20   Lexxi Koslow 02/03/2020, 3:46 PM

## 2020-02-03 NOTE — Progress Notes (Signed)
Recreation Therapy Notes  Date: 02/03/2020  Time: 9:30 am   Location: Craft room     Behavioral response: N/A   Intervention Topic: Relaxation    Discussion/Intervention: Patient did not attend group.   Clinical Observations/Feedback:  Patient did not attend group.   Wilene Pharo LRT/CTRS           Ange Puskas 02/03/2020 11:28 AM

## 2020-02-03 NOTE — Progress Notes (Signed)
Recreation Therapy Notes  Date: 02/03/2020   Time: 1:30pm   Location: Courtyard    Behavioral response: Appropriate   Group Type: Leisure   Participation level: Active   Communication: Patient was social with peers and staff.   Comments: N/A   Kris No LRT/CTRS        Samuel Molina 02/03/2020 3:10 PM

## 2020-02-03 NOTE — Plan of Care (Signed)
  Problem: Group Participation Goal: STG - Patient will engage in groups without prompting or encouragement from LRT x3 group sessions within 5 recreation therapy group sessions Description: STG - Patient will engage in groups without prompting or encouragement from LRT x3 group sessions within 5 recreation therapy group sessions 02/03/2020 1543 by Ernest Haber, LRT Outcome: Not Applicable [id number] by Ernest Haber, LRT Outcome: Not Met (add Reason) Note: Patient spent all his time in his room.

## 2020-02-12 ENCOUNTER — Emergency Department
Admission: EM | Admit: 2020-02-12 | Discharge: 2020-02-13 | Disposition: A | Discharge status: Home / Self Care | Payer: Medicaid Other | Attending: Emergency Medicine | Admitting: Emergency Medicine

## 2020-02-12 ENCOUNTER — Other Ambulatory Visit: Payer: Self-pay

## 2020-02-12 ENCOUNTER — Encounter: Payer: Self-pay | Admitting: Emergency Medicine

## 2020-02-12 ENCOUNTER — Emergency Department
Admission: EM | Admit: 2020-02-12 | Discharge: 2020-02-12 | Disposition: A | Discharge status: Home / Self Care | Payer: Medicaid Other | Attending: Emergency Medicine | Admitting: Emergency Medicine

## 2020-02-12 DIAGNOSIS — F1994 Other psychoactive substance use, unspecified with psychoactive substance-induced mood disorder: Secondary | ICD-10-CM | POA: Diagnosis present

## 2020-02-12 DIAGNOSIS — Z20822 Contact with and (suspected) exposure to covid-19: Secondary | ICD-10-CM | POA: Insufficient documentation

## 2020-02-12 DIAGNOSIS — F1410 Cocaine abuse, uncomplicated: Secondary | ICD-10-CM | POA: Diagnosis present

## 2020-02-12 DIAGNOSIS — G8929 Other chronic pain: Secondary | ICD-10-CM | POA: Insufficient documentation

## 2020-02-12 DIAGNOSIS — F609 Personality disorder, unspecified: Secondary | ICD-10-CM

## 2020-02-12 DIAGNOSIS — Y909 Presence of alcohol in blood, level not specified: Secondary | ICD-10-CM | POA: Insufficient documentation

## 2020-02-12 DIAGNOSIS — F17210 Nicotine dependence, cigarettes, uncomplicated: Secondary | ICD-10-CM | POA: Insufficient documentation

## 2020-02-12 DIAGNOSIS — F332 Major depressive disorder, recurrent severe without psychotic features: Secondary | ICD-10-CM | POA: Insufficient documentation

## 2020-02-12 DIAGNOSIS — M719 Bursopathy, unspecified: Secondary | ICD-10-CM

## 2020-02-12 DIAGNOSIS — R45850 Homicidal ideations: Secondary | ICD-10-CM | POA: Insufficient documentation

## 2020-02-12 DIAGNOSIS — R45851 Suicidal ideations: Secondary | ICD-10-CM | POA: Insufficient documentation

## 2020-02-12 DIAGNOSIS — M25552 Pain in left hip: Secondary | ICD-10-CM | POA: Insufficient documentation

## 2020-02-12 DIAGNOSIS — F39 Unspecified mood [affective] disorder: Secondary | ICD-10-CM | POA: Insufficient documentation

## 2020-02-12 HISTORY — DX: Other psychoactive substance abuse, uncomplicated: F19.10

## 2020-02-12 HISTORY — DX: Anxiety disorder, unspecified: F41.9

## 2020-02-12 HISTORY — DX: Depression, unspecified: F32.A

## 2020-02-12 LAB — RESPIRATORY PANEL BY RT PCR (FLU A&B, COVID)
Influenza A by PCR: NEGATIVE
Influenza B by PCR: NEGATIVE
SARS Coronavirus 2 by RT PCR: NEGATIVE

## 2020-02-12 LAB — CBC
HCT: 42.5 % (ref 39.0–52.0)
HCT: 44 % (ref 39.0–52.0)
Hemoglobin: 14.1 g/dL (ref 13.0–17.0)
Hemoglobin: 14.4 g/dL (ref 13.0–17.0)
MCH: 26.7 pg (ref 26.0–34.0)
MCH: 26.4 pg (ref 26.0–34.0)
MCHC: 33.2 g/dL (ref 30.0–36.0)
MCHC: 32.7 g/dL (ref 30.0–36.0)
MCV: 80.3 fL (ref 80.0–100.0)
MCV: 80.7 fL (ref 80.0–100.0)
Platelets: 295 10*3/uL (ref 150–400)
Platelets: 313 10*3/uL (ref 150–400)
RBC: 5.29 MIL/uL (ref 4.22–5.81)
RBC: 5.45 MIL/uL (ref 4.22–5.81)
RDW: 14.1 % (ref 11.5–15.5)
RDW: 14.4 % (ref 11.5–15.5)
WBC: 10.2 10*3/uL (ref 4.0–10.5)
WBC: 8.5 10*3/uL (ref 4.0–10.5)
nRBC: 0 % (ref 0.0–0.2)
nRBC: 0 % (ref 0.0–0.2)

## 2020-02-12 LAB — ETHANOL
Alcohol, Ethyl (B): <10 mg/dL (ref <10)
Alcohol, Ethyl (B): <10 mg/dL (ref <10)

## 2020-02-12 LAB — COMPREHENSIVE METABOLIC PANEL
ALT: 19 U/L (ref 0–44)
ALT: 18 U/L (ref 0–44)
AST: 23 U/L (ref 15–41)
AST: 20 U/L (ref 15–41)
Albumin: 4 g/dL (ref 3.5–5.0)
Albumin: 3.8 g/dL (ref 3.5–5.0)
Alkaline Phosphatase: 86 U/L (ref 38–126)
Alkaline Phosphatase: 91 U/L (ref 38–126)
Anion gap: 9 (ref 5–15)
Anion gap: 8 (ref 5–15)
BUN: 19 mg/dL (ref 6–20)
BUN: 18 mg/dL (ref 6–20)
CO2: 24 mmol/L (ref 22–32)
CO2: 25 mmol/L (ref 22–32)
Calcium: 8.9 mg/dL (ref 8.9–10.3)
Calcium: 9.4 mg/dL (ref 8.9–10.3)
Chloride: 106 mmol/L (ref 98–111)
Chloride: 106 mmol/L (ref 98–111)
Creatinine, Ser: 0.88 mg/dL (ref 0.61–1.24)
Creatinine, Ser: 1 mg/dL (ref 0.61–1.24)
GFR, Estimated: >60 mL/min (ref >60)
GFR, Estimated: >60 mL/min (ref >60)
Glucose, Bld: 99 mg/dL (ref 70–99)
Glucose, Bld: 106 mg/dL — ABNORMAL HIGH (ref 70–99)
Potassium: 3.6 mmol/L (ref 3.5–5.1)
Potassium: 4.1 mmol/L (ref 3.5–5.1)
Sodium: 139 mmol/L (ref 135–145)
Sodium: 139 mmol/L (ref 135–145)
Total Bilirubin: 0.9 mg/dL (ref 0.3–1.2)
Total Bilirubin: 0.5 mg/dL (ref 0.3–1.2)
Total Protein: 7.5 g/dL (ref 6.5–8.1)
Total Protein: 7.4 g/dL (ref 6.5–8.1)

## 2020-02-12 LAB — URINE DRUG SCREEN, QUALITATIVE (ARMC ONLY)
Amphetamines, Ur Screen: NOT DETECTED
Barbiturates, Ur Screen: NOT DETECTED
Benzodiazepine, Ur Scrn: NOT DETECTED
Cannabinoid 50 Ng, Ur ~~LOC~~: NOT DETECTED
Cocaine Metabolite,Ur ~~LOC~~: POSITIVE — AB
MDMA (Ecstasy)Ur Screen: NOT DETECTED
Methadone Scn, Ur: NOT DETECTED
Opiate, Ur Screen: NOT DETECTED
Phencyclidine (PCP) Ur S: NOT DETECTED
Tricyclic, Ur Screen: NOT DETECTED

## 2020-02-12 LAB — SALICYLATE LEVEL
Salicylate Lvl: <7 mg/dL — ABNORMAL LOW (ref 7.0–30.0)
Salicylate Lvl: <7 mg/dL — ABNORMAL LOW (ref 7.0–30.0)

## 2020-02-12 LAB — ACETAMINOPHEN LEVEL
Acetaminophen (Tylenol), Serum: <10 ug/mL — ABNORMAL LOW (ref 10–30)
Acetaminophen (Tylenol), Serum: <10 ug/mL — ABNORMAL LOW (ref 10–30)

## 2020-02-12 MED ORDER — PANTOPRAZOLE SODIUM 40 MG PO TBEC
80.0000 mg | DELAYED_RELEASE_TABLET | Freq: Every day | ORAL | Status: DC
  Administered 2020-02-12 – 2020-02-13 (×2): 80 mg via ORAL
  Filled 2020-02-12 (×2): qty 2

## 2020-02-12 MED ORDER — IBUPROFEN 600 MG PO TABS
600.0000 mg | ORAL_TABLET | Freq: Once | ORAL | Status: AC
  Administered 2020-02-12: 600 mg via ORAL
  Filled 2020-02-12: qty 1

## 2020-02-12 MED ORDER — MIRTAZAPINE 15 MG PO TABS
30.0000 mg | ORAL_TABLET | Freq: Every day | ORAL | Status: DC
  Administered 2020-02-12: 30 mg via ORAL
  Filled 2020-02-12: qty 2

## 2020-02-12 MED ORDER — ALUM & MAG HYDROXIDE-SIMETH 200-200-20 MG/5ML PO SUSP
30.0000 mL | Freq: Four times a day (QID) | ORAL | Status: DC | PRN
  Filled 2020-02-12: qty 30

## 2020-02-12 MED ORDER — GABAPENTIN 300 MG PO CAPS
300.0000 mg | ORAL_CAPSULE | Freq: Three times a day (TID) | ORAL | Status: DC
  Administered 2020-02-12 – 2020-02-13 (×2): 300 mg via ORAL
  Filled 2020-02-12 (×2): qty 1

## 2020-02-12 MED ORDER — METHOCARBAMOL 750 MG PO TABS
1500.0000 mg | ORAL_TABLET | Freq: Once | ORAL | Status: AC
  Administered 2020-02-12: 1500 mg via ORAL
  Filled 2020-02-12: qty 2

## 2020-02-12 MED ORDER — PANTOPRAZOLE SODIUM 40 MG PO TBEC: 80.0000 mg | DELAYED_RELEASE_TABLET | Freq: Every day | ORAL | Status: DC

## 2020-02-12 MED ORDER — NAPROXEN 500 MG PO TABS
500.0000 mg | ORAL_TABLET | Freq: Two times a day (BID) | ORAL | Status: DC
  Filled 2020-02-12 (×3): qty 1

## 2020-02-12 MED ORDER — GABAPENTIN 300 MG PO CAPS: 300.0000 mg | ORAL_CAPSULE | Freq: Three times a day (TID) | ORAL | Status: DC

## 2020-02-12 MED ORDER — CITALOPRAM HYDROBROMIDE 20 MG PO TABS
10.0000 mg | ORAL_TABLET | Freq: Every day | ORAL | Status: DC
  Administered 2020-02-12 – 2020-02-13 (×2): 10 mg via ORAL
  Filled 2020-02-12 (×2): qty 1

## 2020-02-12 MED ORDER — CITALOPRAM HYDROBROMIDE 20 MG PO TABS: 10.0000 mg | ORAL_TABLET | Freq: Every day | ORAL | Status: DC

## 2020-02-12 MED ORDER — MIRTAZAPINE 15 MG PO TABS: 30.0000 mg | ORAL_TABLET | Freq: Every day | ORAL | Status: DC

## 2020-02-12 NOTE — ED Notes (Signed)
Pt not wanting to be discharged, but cooperating with getting dressed.

## 2020-02-12 NOTE — ED Notes (Signed)
Pt. Transferred from Triage to room 21 after dressing out and screening for contraband. Pt. Oriented to [place] including Q15 minute rounds as well as Engineer, drilling for their protection. Patient is alert and oriented, warm and dry in no acute distress. Patient reported HI towards his cousin. Denied AVH. Pt. Encouraged to let me know if needs arise.

## 2020-02-12 NOTE — BH Assessment (Signed)
Assessment Note  Samuel Molina is an 59 y.o. male. Per triage note: Pt in via POV, reports worsening depression, anxiety, thoughts of wanting to harm others, states, "I just dont want to live anymore. I know I need help, that's what I'm trying to do now."  Restless in triage, NAD noted at this time.  Pt presented with an unremarkable appearance and was alert and oriented x4. Pt spoke at a loud volume; pt.'s speech was rapid and somewhat pressured. Motor behavior appears normal evidenced by his freedom of movement. Patient's speech was coherent but often irrelevant/circumstantial. Eye contact was good. Pt's mood was labile, affect is incongruent with content of speech. Patient reported worsening symptoms of depression such as increased irritability, feelings of hopelessness, and worthlessness. Pt identified her stressors are ongoing conflict with his family members which results in him having HI. Pt became tearful when explaining his estrangement and lack of communication with his daughter. Pt reports sleep disturbance. Pt explained that he goes to bed at 4-5 AM and sleeps until 2PM even when given sleep medications. Patient reported he is not connected to a psychiatrist or therapist. Patient is honest about his ongoing substance abuse and expresses a need for treatment. Pt reported that he attempted to stay with his son in [place] however the living arrangement was not successful. The patient denied SI, AV/hallucinations, or symptoms of paranoia. Patient was noted to be focused on telling jokes to this writer throughout the entirety of the assessment.  Diagnosis: Substance-induced depressive disorder  Past Medical History:  Past Medical History:  Diagnosis Date  . Anxiety   . Depression   . GERD (gastroesophageal reflux disease)   . Obesity   . Substance abuse [facility])     Past Surgical History:  Procedure Laterality Date  . ESOPHAGOGASTRODUODENOSCOPY (EGD) WITH PROPOFOL N/A 09/20/2017    Procedure: ESOPHAGOGASTRODUODENOSCOPY (EGD) WITH PROPOFOL;  Surgeon: Lin Landsman, MD;  Location: [place];  Service: Gastroenterology;  Laterality: N/A;  . KNEE SURGERY Right     Family History: No family history on file.  Social History:  reports that he has been smoking cigarettes. He has been smoking about 0.50 packs per day. He has never used smokeless tobacco. He reports previous alcohol use. He reports current drug use. Drug: Cocaine.  Additional Social History:  Alcohol / Drug Use Pain Medications: See PTA Prescriptions: See PTA History of alcohol / drug use?: Yes Substance #1 Name of Substance 1: Crack Cocaine  CIWA: CIWA-Ar BP: (!) 128/52 Pulse Rate: 61 COWS:    Allergies:  Allergies  Allergen Reactions  . Acetaminophen Itching, Nausea And Vomiting and Rash    Home Medications: (Not in a hospital admission)   OB/GYN Status:  No LMP for male patient.  General Assessment Data Location of Assessment: [facility] ED TTS Assessment: In system Is this a Tele or Face-to-Face Assessment?: Face-to-Face Is this an Initial Assessment or a Re-assessment for this encounter?: Initial Assessment Patient Accompanied by:: N/A Language Other than English: No Living Arrangements: Other (Comment) What gender do you identify as?: Male Date Telepsych consult ordered in [facility]: 02/12/20 Time Telepsych consult ordered in [facility]: 1827 Marital status: [place] name: n/a Pregnancy Status: No Living Arrangements: Alone Can pt return to current living arrangement?: Yes Admission Status: Voluntary Is patient capable of signing voluntary admission?: Yes Referral Source: Self/Family/Friend Insurance type: Medicaid [place]  Medical Screening Exam ([place]) Medical Exam completed: Yes  Crisis Care Plan Living Arrangements: Alone Legal Guardian: Other: (Self) Name of  Psychiatrist: None Name of Therapist: None  Education Status Is patient currently in school?: No Highest  grade of school patient has completed: Bachelors degree in criminlogy Is the patient employed, unemployed or receiving disability?: Employed  Risk to self with the past 6 months Suicidal Ideation: No Has patient been a risk to self within the past 6 months prior to admission? : No Suicidal Intent: No Has patient had any suicidal intent within the past 6 months prior to admission? : No Is patient at risk for suicide?: No Suicidal Plan?: No Has patient had any suicidal plan within the past 6 months prior to admission? : No Access to Means: No What has been your use of drugs/alcohol within the last 12 months?: Crack Cocaine Previous Attempts/Gestures: No How many times?: 0 Other Self Harm Risks: None Triggers for Past Attempts: None known Intentional Self Injurious Behavior: None Family Suicide History: No Recent stressful life event(s): Conflict (Comment) Persecutory voices/beliefs?: No Depression: Yes Depression Symptoms: Despondent, Feeling worthless/self pity, Insomnia Substance abuse history and/or treatment for substance abuse?: Yes Suicide prevention information given to non-admitted patients: Not applicable  Risk to Others within the past 6 months Homicidal Ideation: Yes-Currently Present Does patient have any lifetime risk of violence toward others beyond the six months prior to admission? : No Thoughts of Harm to Others: Yes-Currently Present Comment - Thoughts of Harm to Others: Pt alluded to wanting to harm everyone that frustrates him Current Homicidal Intent: No Current Homicidal Plan: No Access to Homicidal Means: No Describe Access to Homicidal Means: None noted Identified Victim: People that get on his nerves History of harm to others?: No Assessment of Violence: None Noted Violent Behavior Description: n/a Does patient have access to weapons?: No Criminal Charges Pending?: No Does patient have a court date: No Is patient on probation?:  No  Psychosis Hallucinations: None noted Delusions: None noted  Mental Status Report Appearance/Hygiene: In scrubs Eye Contact: Good Motor Activity: Freedom of movement Speech: Logical/coherent Level of Consciousness: Alert, Irritable Mood: Irritable, Labile Affect: Appropriate to circumstance Anxiety Level: None Thought Processes: Coherent, Relevant Judgement: Unimpaired Orientation: Person, Place, Time, Situation, Appropriate for developmental age Obsessive Compulsive Thoughts/Behaviors: None  Cognitive Functioning Concentration: Normal Memory: Recent Intact, Remote Intact Is patient IDD: No Insight: Poor Impulse Control: Poor Appetite: Good Have you had any weight changes? : No Change Sleep: Decreased Total Hours of Sleep:  (Pt unable to quantify) Vegetative Symptoms: None  ADLScreening [facility] Assessment Services) Patient's cognitive ability adequate to safely complete daily activities?: Yes Patient able to express need for assistance with ADLs?: Yes Independently performs ADLs?: Yes (appropriate for developmental age)  Prior Inpatient Therapy Prior Inpatient Therapy: Yes Prior Therapy Dates: 01/2020, 12/2019 Prior Therapy Facilty/Provider(s): [facility] Reason for Treatment: Major Depression,  Personality Disorder  Prior Outpatient Therapy Prior Outpatient Therapy: No Does patient have an ACCT team?: No Does patient have Intensive In-House Services?  : No Does patient have [facility] services? : No Does patient have P4CC services?: No  ADL Screening (condition at time of admission) Patient's cognitive ability adequate to safely complete daily activities?: Yes Is the patient deaf or have difficulty hearing?: No Does the patient have difficulty concentrating, remembering, or making decisions?: No Patient able to express need for assistance with ADLs?: Yes Does the patient have difficulty dressing or bathing?: No Independently performs ADLs?: Yes (appropriate for  developmental age) Does the patient have difficulty walking or climbing stairs?: No Weakness of Legs: None Weakness of Arms/Hands: None  Home Assistive  Devices/Equipment Home Assistive Devices/Equipment: None  Therapy Consults (therapy consults require a physician order) PT Evaluation Needed: No OT Evalulation Needed: No SLP Evaluation Needed: No Abuse/Neglect Assessment (Assessment to be complete while patient is alone) Abuse/Neglect Assessment Can Be Completed: Yes Physical Abuse: Denies Verbal Abuse: Denies Sexual Abuse: Denies Exploitation of patient/patient's resources: Denies Self-Neglect: Denies Values / Beliefs Cultural Requests During Hospitalization: None Spiritual Requests During Hospitalization: None Consults Spiritual Care Consult Needed: No Transition of Care Team Consult Needed: No Advance Directives (For Healthcare) Does Patient Have a Medical Advance Directive?: No Would patient like information on creating a medical advance directive?: No - Patient declined          Disposition: Per Corene Cornea, B., pt is recommended for overnight observation.  Disposition Initial Assessment Completed for this Encounter: Yes Patient referred to: Other (Comment)  On Site Evaluation by:   Reviewed with Physician:    Kathi Ludwig 02/12/2020 11:57 PM

## 2020-02-12 NOTE — ED Notes (Signed)
Hourly rounding completed at this time, patient currently asleep in room. No complaints, stable, and in no acute distress. Q15 minute rounds and monitoring via Security Cameras to continue. 

## 2020-02-12 NOTE — ED Notes (Signed)
Patient transferred to [facility] from ED to room 3 after screening for contraband. Report received from [place] including Situation, Background, Assessment and Recommendations. Pt oriented to unit including Q15 minute rounds as well as the security cameras for their protection. Patient is alert and oriented, warm and dry in no acute distress. Pt. Encouraged to let this nurse know if needs arise.

## 2020-02-12 NOTE — ED Notes (Signed)
Pt given icee, saltines, and chips to attempt helping acid reflux. Pt asking again to speak to doctor, pt is agitated when discussing this. MD Isaacs aware.

## 2020-02-12 NOTE — ED Notes (Addendum)
MD Isaacs at bedside at this time. Pt given drink. Pt given meal tray, lights turned down for comfort. Denies further needs at this time.

## 2020-02-12 NOTE — ED Notes (Signed)
Hourly rounding reveals patient in room. No complaints, stable, in no acute distress. Q15 minute rounds and monitoring via Rover and Officer to continue.   

## 2020-02-12 NOTE — ED Provider Notes (Signed)
[facility] Emergency Department Provider Note  ____________________________________________   First MD Initiated Contact with Patient 02/12/20 1732     (approximate)  I have reviewed the triage vital signs and the nursing notes.   HISTORY  Chief Complaint Psychiatric Evaluation    HPI Samuel Molina is a 59 y.o. male with past medical history as below including substance-induced mood disorder here with suicidal ideation.  The patient was just seen and evaluated overnight for reported suicidal ideation, which was thought due to cocaine abuse.  The patient states that while he has been using, he states his depression was prior to his recent binge use of cocaine, and that his primary issue is depression and poor social/living situations.  He reports he feels hopeless and that he does not have any family or nearby support.  He states that when he left the hospital, he immediately began thinking about ways to harm himself and he became concerned that he would do it, so he presents for evaluation.  He would desire inpatient psychiatric treatment.  Denies any drug use since he was just in the ER.  No other complaints.        Past Medical History:  Diagnosis Date  . Anxiety   . Depression   . GERD (gastroesophageal reflux disease)   . Obesity   . Substance abuse [facility])     Patient Active Problem List   Diagnosis Date Noted  . Substance induced mood disorder ([place]) 02/12/2020  . Severe recurrent major depression without psychotic features ([place]) 01/21/2020  . Cocaine abuse ([place]) 01/21/2020  . Hip pain 01/21/2020  . Major depression 01/20/2020  . Gastroesophageal reflux disease with esophagitis   . Morbid obesity ([place]) 09/19/2017  . Arthritis of knee 11/03/2015    Past Surgical History:  Procedure Laterality Date  . ESOPHAGOGASTRODUODENOSCOPY (EGD) WITH PROPOFOL N/A 09/20/2017   Procedure: ESOPHAGOGASTRODUODENOSCOPY (EGD) WITH PROPOFOL;  Surgeon: Lin Landsman, MD;  Location: [place];  Service: Gastroenterology;  Laterality: N/A;  . KNEE SURGERY Right     Prior to Admission medications   Medication Sig Start Date End Date Taking? Authorizing Provider  citalopram (CELEXA) 10 MG tablet Take 1 tablet (10 mg total) by mouth daily. 02/03/20   Clapacs, [name], MD  gabapentin (NEURONTIN) 300 MG capsule Take 1 capsule (300 mg total) by mouth 3 (three) times daily. 02/03/20   Clapacs, [name], MD  mirtazapine (REMERON) 30 MG tablet Take 1 tablet (30 mg total) by mouth at bedtime. 02/03/20   Clapacs, [name], MD  naproxen (NAPROSYN) 500 MG tablet Take 1 tablet (500 mg total) by mouth 2 (two) times daily with a meal. 02/03/20   Clapacs, [name], MD  pantoprazole (PROTONIX) 40 MG tablet Take 2 tablets (80 mg total) by mouth daily. 02/04/20   Clapacs, [name], MD  omeprazole (PRILOSEC OTC) 20 MG tablet Take 2 tablets (40 mg total) by mouth 2 (two) times daily before a meal. 09/19/17 01/29/19  Lin Landsman, MD    Allergies Acetaminophen  No family history on file.  Social History Social History   Tobacco Use  . Smoking status: Current Every Day Smoker    Packs/day: 0.50    Types: Cigarettes  . Smokeless tobacco: Never Used  Vaping Use  . Vaping Use: Never used  Substance Use Topics  . Alcohol use: Not Currently  . Drug use: Yes    Types: Cocaine    Review of Systems  Review of Systems  Constitutional: Positive for fatigue. Negative for chills and fever.  HENT: Negative for sore throat.   Respiratory: Negative for shortness of breath.   Cardiovascular: Negative for chest pain.  Gastrointestinal: Negative for abdominal pain.  Genitourinary: Negative for flank pain.  Musculoskeletal: Negative for neck pain.  Skin: Negative for rash and wound.  Allergic/Immunologic: Negative for immunocompromised state.  Neurological: Negative for weakness and numbness.  Hematological: Does not bruise/bleed easily.    Psychiatric/Behavioral: Positive for dysphoric mood and suicidal ideas.  All other systems reviewed and are negative.    ____________________________________________  PHYSICAL EXAM:      VITAL SIGNS: ED Triage Vitals  Enc Vitals Group     BP 02/12/20 1920 (!) 128/52     Pulse Rate 02/12/20 1920 61     Resp 02/12/20 1920 20     Temp 02/12/20 1920 97.8 F (36.6 C)     Temp Source 02/12/20 1920 Oral     SpO2 02/12/20 1920 99 %     Weight 02/12/20 1656 217 lb (98.4 kg)     Height 02/12/20 1656 6\' 1"  (1.854 m)     Head Circumference --      Peak Flow --      Pain Score 02/12/20 1656 9     Pain Loc --      Pain Edu? --      Excl. in [place]? --      Physical Exam Vitals and nursing note reviewed.  Constitutional:      General: He is not in acute distress.    Appearance: He is well-developed.  HENT:     Head: Normocephalic and atraumatic.  Eyes:     Conjunctiva/sclera: Conjunctivae normal.  Cardiovascular:     Rate and Rhythm: Normal rate and regular rhythm.     Heart sounds: Normal heart sounds. No murmur heard.  No friction rub.  Pulmonary:     Effort: Pulmonary effort is normal. No respiratory distress.     Breath sounds: Normal breath sounds. No wheezing or rales.  Abdominal:     General: There is no distension.     Palpations: Abdomen is soft.     Tenderness: There is no abdominal tenderness.  Musculoskeletal:     Cervical back: Neck supple.  Skin:    General: Skin is warm.     Capillary Refill: Capillary refill takes less than 2 seconds.  Neurological:     Mental Status: He is alert and oriented to person, place, and time.     Motor: No abnormal muscle tone.  Psychiatric:     Comments: Dysphoric mood, suicidal ideation       ____________________________________________   LABS (all labs ordered are listed, but only abnormal results are displayed)  Labs Reviewed  COMPREHENSIVE METABOLIC PANEL - Abnormal; Notable for the following components:      Result  Value   Glucose, Bld 106 (*)    All other components within normal limits  SALICYLATE LEVEL - Abnormal; Notable for the following components:   Salicylate Lvl <5.2 (*)    All other components within normal limits  ACETAMINOPHEN LEVEL - Abnormal; Notable for the following components:   Acetaminophen (Tylenol), Serum <10 (*)    All other components within normal limits  ETHANOL  CBC    ____________________________________________  EKG:  ________________________________________  RADIOLOGY All imaging, including plain films, CT scans, and ultrasounds, independently reviewed by me, and interpretations confirmed via formal radiology reads.  ED MD interpretation:     Official  radiology report(s): No results found.  ____________________________________________  PROCEDURES   Procedure(s) performed (including Critical Care):  Procedures  ____________________________________________  INITIAL IMPRESSION / MDM / [place] / ED COURSE  As part of my medical decision making, I reviewed the following data within the [place] notes reviewed and incorporated, Old chart reviewed, Notes from prior ED visits, and [place] Controlled Substance Database       *Saleh Ulbrich was evaluated in Emergency Department on 02/12/2020 for the symptoms described in the history of present illness. He was evaluated in the context of the global COVID-19 pandemic, which necessitated consideration that the patient might be at risk for infection with the SARS-CoV-2 virus that causes COVID-19. Institutional protocols and algorithms that pertain to the evaluation of patients at risk for COVID-19 are in a state of rapid change based on information released by regulatory bodies including the CDC and federal and state organizations. These policies and algorithms were followed during the patient's care in the ED.  Some ED evaluations and interventions may be delayed as a result of  limited staffing during the pandemic.*     Medical Decision Making: 59 year old male with history of depression, substance abuse, here with ongoing depression, status post recent evaluation by psychiatry just prior to returning to the ER.  Clinically, he does not appear intoxicated.  Lab work is reassuring with baseline LFTs and CBC.  Alcohol negative.  Tox labs negative.  Will have psych discuss again regarding his repeat visit and ongoing suicidal ideation.  The patient has been placed in psychiatric observation due to the need to provide a safe environment for the patient while obtaining psychiatric consultation and evaluation, as well as ongoing medical and medication management to treat the patient's condition.  The patient has not been placed under full IVC at this time.  ____________________________________________  FINAL CLINICAL IMPRESSION(S) / ED DIAGNOSES  Final diagnoses:  Suicidal ideation     MEDICATIONS GIVEN DURING THIS VISIT:  Medications  citalopram (CELEXA) tablet 10 mg (has no administration in time range)  gabapentin (NEURONTIN) capsule 300 mg (has no administration in time range)  mirtazapine (REMERON) tablet 30 mg (has no administration in time range)  pantoprazole (PROTONIX) EC tablet 80 mg (has no administration in time range)  alum & mag hydroxide-simeth (MAALOX/MYLANTA) 200-200-20 MG/5ML suspension 30 mL (has no administration in time range)     ED Discharge Orders    None       Note:  This document was prepared using Dragon voice recognition software and may include unintentional dictation errors.   Duffy Bruce, MD 02/12/20 2124

## 2020-02-12 NOTE — Consult Note (Signed)
[facility] Face-to-Face Psychiatry Consult   Reason for Consult:  Behavior Problems Referring Physician:  Dr. Karma Greaser Patient Identification: Samuel Molina MRN:  [id number] Principal Diagnosis: <principal problem not specified> Diagnosis:  Active Problems:   Major depression   Severe recurrent major depression without psychotic features ([place])   Cocaine abuse ([place])   Total Time spent with patient: 30 minutes  Subjective: " I want to hurt my cousin." Sandip Power is a 59 y.o. male patient presented to [facility] ED via POV voluntarily voicing that people are getting on his nerves. Per the ED triage nurse note, the patient states that people are getting on his nerves and want to hurt his cousin. The patient denies SI.  The patient states that he is having left hip pain. The patient says that the pain is chronic. The patient states that he does not want to be seen in psych until he is evaluated for his hip pain. The patient says that he used cocaine today.  The patient was seen face-to-face by this provider; the chart was reviewed and consulted with Dr. Karma Greaser on 02/12/2020 due to the patient's care. It was discussed with the EDP that the patient will remain under observation overnight and will be reassessed in the a.m. to determine if he meets the criteria for psychiatric inpatient admission; he could be discharged back home. The patient comes to the ED when irritated with family members who "are getting on his nerves." On evaluation, the patient is alert and oriented x4, calm, making demands of being in a room and threatening to act out if he is not placed in a room but cooperative and mood-congruent with affect. The patient admits to using substance cocaine in particular. He voiced that he is open to going into rehab for his substance use disorder. He stated he does not have any mental problems. Therefore, he does not take psychiatric medications. The patient does not appear to be responding to  internal or external stimuli. Neither is the patient presenting with any delusional thinking. The patient denies auditory or visual hallucinations. The patient denies any suicidal ideation but admits to homicidal ideations. The patient is not presenting with any psychotic or paranoid behaviors. During an encounter with the patient, he was able to answer questions appropriately.  HPI:    Past Psychiatric History:   Risk to Self:   No Risk to Others:   No Prior Inpatient Therapy:   Yes Prior Outpatient Therapy:  Yes  Past Medical History:  Past Medical History:  Diagnosis Date  . GERD (gastroesophageal reflux disease)   . Obesity     Past Surgical History:  Procedure Laterality Date  . ESOPHAGOGASTRODUODENOSCOPY (EGD) WITH PROPOFOL N/A 09/20/2017   Procedure: ESOPHAGOGASTRODUODENOSCOPY (EGD) WITH PROPOFOL;  Surgeon: Lin Landsman, MD;  Location: [facility];  Service: Gastroenterology;  Laterality: N/A;  . KNEE SURGERY Right    Family History: No family history on file. Family Psychiatric  History:  Social History:  Social History   Substance and Sexual Activity  Alcohol Use Not Currently     Social History   Substance and Sexual Activity  Drug Use Yes  . Types: Cocaine    Social History   Socioeconomic History  . Marital status: Single    Spouse name: Not on file  . Number of children: Not on file  . Years of education: Not on file  . Highest education level: Not on file  Occupational History  . Not on file  Tobacco Use  .  Smoking status: Current Every Day Smoker    Packs/day: 0.50    Types: Cigarettes  . Smokeless tobacco: Never Used  Vaping Use  . Vaping Use: Never used  Substance and Sexual Activity  . Alcohol use: Not Currently  . Drug use: Yes    Types: Cocaine  . Sexual activity: Not on file  Other Topics Concern  . Not on file  Social History Narrative  . Not on file   Social Determinants of Health   Financial Resource Strain:   .  Difficulty of Paying Living Expenses: Not on file  Food Insecurity:   . Worried About Charity fundraiser in the Last Year: Not on file  . Ran Out of Food in the Last Year: Not on file  Transportation Needs:   . Lack of Transportation (Medical): Not on file  . Lack of Transportation (Non-Medical): Not on file  Physical Activity:   . Days of Exercise per Week: Not on file  . Minutes of Exercise per Session: Not on file  Stress:   . Feeling of Stress : Not on file  Social Connections:   . Frequency of Communication with Friends and Family: Not on file  . Frequency of Social Gatherings with Friends and Family: Not on file  . Attends Religious Services: Not on file  . Active Member of Clubs or Organizations: Not on file  . Attends Archivist Meetings: Not on file  . Marital Status: Not on file   Additional Social History:    Allergies:   Allergies  Allergen Reactions  . Acetaminophen Rash, Itching and Nausea And Vomiting    Labs:  Results for orders placed or performed during the hospital encounter of 02/12/20 (from the past 48 hour(s))  Comprehensive metabolic panel     Status: None   Collection Time: 02/12/20  2:59 AM  Result Value Ref Range   Sodium 139 135 - 145 mmol/L   Potassium 3.6 3.5 - 5.1 mmol/L   Chloride 106 98 - 111 mmol/L   CO2 24 22 - 32 mmol/L   Glucose, Bld 99 70 - 99 mg/dL    Comment: Glucose reference range applies only to samples taken after fasting for at least 8 hours.   BUN 19 6 - 20 mg/dL   Creatinine, Ser 0.88 0.61 - 1.24 mg/dL   Calcium 8.9 8.9 - 10.3 mg/dL   Total Protein 7.5 6.5 - 8.1 g/dL   Albumin 4.0 3.5 - 5.0 g/dL   AST 23 15 - 41 U/L   ALT 19 0 - 44 U/L   Alkaline Phosphatase 86 38 - 126 U/L   Total Bilirubin 0.9 0.3 - 1.2 mg/dL   GFR, Estimated >60 >60 mL/min   Anion gap 9 5 - 15    Comment: Performed at [facility], [address] [place], [place] [address]  Ethanol     Status: None   Collection Time: 02/12/20   2:59 AM  Result Value Ref Range   Alcohol, Ethyl (B) <10 <10 mg/dL    Comment: (NOTE) Lowest detectable limit for serum alcohol is 10 mg/dL.  For medical purposes only. Performed at [facility], [address] [place], [place] [address]   Salicylate level     Status: Abnormal   Collection Time: 02/12/20  2:59 AM  Result Value Ref Range   Salicylate Lvl <7.8 (L) 7.0 - 30.0 mg/dL    Comment: Performed at [facility], [place], [place]  [address]  Acetaminophen level     Status: Abnormal   Collection Time: 02/12/20  2:59 AM  Result Value Ref Range   Acetaminophen (Tylenol), Serum <10 (L) 10 - 30 ug/mL    Comment: (NOTE) Therapeutic concentrations vary significantly. A range of 10-30 ug/mL  may be an effective concentration for many patients. However, some  are best treated at concentrations outside of this range. Acetaminophen concentrations >150 ug/mL at 4 hours after ingestion  and >50 ug/mL at 12 hours after ingestion are often associated with  toxic reactions.  Performed at [facility], [address] [place], [place] [address]   cbc     Status: None   Collection Time: 02/12/20  2:59 AM  Result Value Ref Range   WBC 10.2 4.0 - 10.5 K/uL   RBC 5.29 4.22 - 5.81 MIL/uL   Hemoglobin 14.1 13.0 - 17.0 g/dL   HCT 42.5 39 - 52 %   MCV 80.3 80.0 - 100.0 fL   MCH 26.7 26.0 - 34.0 pg   MCHC 33.2 30.0 - 36.0 g/dL   RDW 14.1 11.5 - 15.5 %   Platelets 295 150 - 400 K/uL   nRBC 0.0 0.0 - 0.2 %    Comment: Performed at [facility], [address] [place], [place] [address]    No current facility-administered medications for this encounter.   Current Outpatient Medications  Medication Sig Dispense Refill  . citalopram (CELEXA) 10 MG tablet Take 1 tablet (10 mg total) by mouth daily. 30 tablet 1  . gabapentin (NEURONTIN) 300 MG capsule Take 1 capsule (300 mg total) by mouth 3 (three) times daily. 90 capsule 1  .  mirtazapine (REMERON) 30 MG tablet Take 1 tablet (30 mg total) by mouth at bedtime. 30 tablet 1  . naproxen (NAPROSYN) 500 MG tablet Take 1 tablet (500 mg total) by mouth 2 (two) times daily with a meal. 60 tablet 0  . pantoprazole (PROTONIX) 40 MG tablet Take 2 tablets (80 mg total) by mouth daily. 60 tablet 1    Musculoskeletal: Strength & Muscle Tone: decreased Gait & Station: normal Patient leans: N/A  Psychiatric Specialty Exam: Physical Exam Vitals and nursing note reviewed.  Constitutional:      Appearance: Normal appearance. He is normal weight.  HENT:     Right Ear: Tympanic membrane normal.     Left Ear: Tympanic membrane normal.     Nose: Nose normal.  Pulmonary:     Effort: Pulmonary effort is normal.  Musculoskeletal:        General: Normal range of motion.     Cervical back: Normal range of motion and neck supple.  Neurological:     General: No focal deficit present.     Mental Status: He is alert and oriented to person, place, and time.  Psychiatric:        Attention and Perception: Attention and perception normal.        Mood and Affect: Mood and affect normal.        Speech: Speech normal.        Behavior: Behavior is uncooperative and hyperactive.        Thought Content: Thought content normal.        Cognition and Memory: Cognition and memory normal.        Judgment: Judgment is impulsive.     Review of Systems  Psychiatric/Behavioral: Positive for agitation and behavioral problems. The patient is hyperactive.   All other systems reviewed and  are negative.   Blood pressure 133/72, pulse 62, temperature 98.3 F (36.8 C), temperature source Oral, resp. rate 18, height 6\' 1"  (1.854 m), weight 98.4 kg, SpO2 95 %.Body mass index is 28.63 kg/m.  General Appearance: Bizarre  Eye Contact:  Fair  Speech:  Clear and Coherent  Volume:  Normal  Mood:  Anxious, Depressed and Irritable  Affect:  Congruent, Depressed, Flat and Inappropriate  Thought Process:   Coherent  Orientation:  Full (Time, Place, and Person)  Thought Content:  Logical and Paranoid Ideation  Suicidal Thoughts:  No  Homicidal Thoughts:  Yes.  with intent/plan  Memory:  Immediate;   Good Recent;   Good Remote;   Good  Judgement:  Poor  Insight:  Fair  Psychomotor Activity:  Normal  Concentration:  Concentration: Good and Attention Span: Good  Recall:  Good  Fund of Knowledge:  Good  Language:  Good  Akathisia:  Negative  Handed:  Right  AIMS (if indicated):     Assets:  Communication Skills Desire for Improvement Physical Health Social Support  ADL's:  Intact  Cognition:  WNL  Sleep:        Treatment Plan Summary: Daily contact with patient to assess and evaluate symptoms and progress in treatment, Medication management and Plan The patient remained under observation overnight and will be reassessed in the a.m. to determine if he meets the criteria for psychiatric inpatient admission; he could be discharged back home.  Disposition: Supportive therapy provided about ongoing stressors. The patient remained under observation overnight and will be reassessed in the a.m. to determine if he meets the criteria for psychiatric inpatient admission; he could be discharged back home.  Caroline Sauger, NP 02/12/2020 4:31 AM

## 2020-02-12 NOTE — Discharge Instructions (Addendum)
Please follow up at outpatient rehab facility

## 2020-02-12 NOTE — ED Notes (Signed)
VOLUNTARY awaiting TTS/PSYCH consult 

## 2020-02-12 NOTE — ED Notes (Signed)
Pt to doorway asking to speak with doctor. Pt irritable at this time. MD Isaacs aware.

## 2020-02-12 NOTE — ED Notes (Signed)
Pt given a sandwich tray and icee.

## 2020-02-12 NOTE — ED Provider Notes (Signed)
[facility] Emergency Department Provider Note  ____________________________________________   First MD Initiated Contact with Patient 02/12/20 [id number]     (approximate)  I have reviewed the triage vital signs and the nursing notes.   HISTORY  Chief Complaint Psychiatric Evaluation and Hip Pain    HPI Samuel Molina is a 59 y.o. male with a history of chronic left hip pain, chronic right knee pain, recurrent severe major depression, previously documented personality disorder and adjustment and/or mood disorders, who presents voluntarily for evaluation of homicidal thoughts.  He said that he has been struggling with his temper and bad thoughts towards a cousin in particular but people in general.  This has been gradually worsening for quite some time.  He admits to recent cocaine use (about 12 hours ago) and said he knows this does not make it better.  As his temper and anger were getting worse and worse tonight he knew that he should get to a safe place before he did something he did not want to do.  Of note, he became verbally abusive and argumentative in the emergency department when he was initially going to be placed  in a hallway bed.  He threatened to "get violent" and less he was given an actual room.  The patient is calm and cooperative during my assessment.  He complains of left hip pain that is persistent after weeks if not months and for which he has been treated previously with with various combinations of opioids, muscle relaxants, and NSAIDs.  He has not had any recent trauma, the hip just still hurts him.  He is able to walk without any difficulty.  He denies sore throat, chest pain, shortness of breath, nausea, vomiting, and abdominal pain.  He describes his psychiatric symptoms as severe nothing in particular makes them better and cocaine may make them worse although he is not certain of that.        Past Medical History:  Diagnosis Date  .  GERD (gastroesophageal reflux disease)   . Obesity     Patient Active Problem List   Diagnosis Date Noted  . Severe recurrent major depression without psychotic features ([place]) 01/21/2020  . Cocaine abuse ([place]) 01/21/2020  . Hip pain 01/21/2020  . Major depression 01/20/2020  . Gastroesophageal reflux disease with esophagitis   . Morbid obesity ([place]) 09/19/2017  . Arthritis of knee 11/03/2015    Past Surgical History:  Procedure Laterality Date  . ESOPHAGOGASTRODUODENOSCOPY (EGD) WITH PROPOFOL N/A 09/20/2017   Procedure: ESOPHAGOGASTRODUODENOSCOPY (EGD) WITH PROPOFOL;  Surgeon: Lin Landsman, MD;  Location: [facility];  Service: Gastroenterology;  Laterality: N/A;  . KNEE SURGERY Right     Prior to Admission medications   Medication Sig Start Date End Date Taking? Authorizing Provider  citalopram (CELEXA) 10 MG tablet Take 1 tablet (10 mg total) by mouth daily. 02/03/20   Clapacs, [name], MD  gabapentin (NEURONTIN) 300 MG capsule Take 1 capsule (300 mg total) by mouth 3 (three) times daily. 02/03/20   Clapacs, [name], MD  mirtazapine (REMERON) 30 MG tablet Take 1 tablet (30 mg total) by mouth at bedtime. 02/03/20   Clapacs, [name], MD  naproxen (NAPROSYN) 500 MG tablet Take 1 tablet (500 mg total) by mouth 2 (two) times daily with a meal. 02/03/20   Clapacs, [name], MD  pantoprazole (PROTONIX) 40 MG tablet Take 2 tablets (80 mg total) by mouth daily. 02/04/20   Clapacs, [name], MD  omeprazole (PRILOSEC OTC)  20 MG tablet Take 2 tablets (40 mg total) by mouth 2 (two) times daily before a meal. 09/19/17 01/29/19  Lin Landsman, MD    Allergies Acetaminophen  No family history on file.  Social History Social History   Tobacco Use  . Smoking status: Current Every Day Smoker    Packs/day: 0.50    Types: Cigarettes  . Smokeless tobacco: Never Used  Vaping Use  . Vaping Use: Never used  Substance Use Topics  . Alcohol use: Not Currently  . Drug use: Yes    Types:  Cocaine    Review of Systems Constitutional: No fever/chills Eyes: No visual changes. ENT: No sore throat. Cardiovascular: Denies chest pain. Respiratory: Denies shortness of breath. Gastrointestinal: No abdominal pain.  No nausea, no vomiting.  No diarrhea.  No constipation. Genitourinary: Negative for dysuria.  Musculoskeletal: Chronic left hip and right knee pain. Integumentary: Negative for rash. Neurological: Negative for headaches, focal weakness or numbness. Psych:  Anger, HI, cocaine abuse.   ____________________________________________   PHYSICAL EXAM:  VITAL SIGNS: ED Triage Vitals  Enc Vitals Group     BP 02/12/20 0250 133/72     Pulse Rate 02/12/20 0250 62     Resp 02/12/20 0250 18     Temp 02/12/20 0250 98.3 F (36.8 C)     Temp Source 02/12/20 0250 Oral     SpO2 02/12/20 0250 95 %     Weight 02/12/20 0252 98.4 kg (217 lb)     Height 02/12/20 0252 1.854 m (6\' 1" )     Head Circumference --      Peak Flow --      Pain Score 02/12/20 0252 10     Pain Loc --      Pain Edu? --      Excl. in [place]? --     Constitutional: Alert and oriented.  Eyes: Conjunctivae are normal.  Head: Atraumatic. Nose: No congestion/rhinnorhea. Mouth/Throat: Patient is wearing a mask. Neck: No stridor.  No meningeal signs.   Cardiovascular: Normal rate, regular rhythm. Good peripheral circulation. Grossly normal heart sounds. Respiratory: Normal respiratory effort.  No retractions. Gastrointestinal: Soft and nontender. No distention.  Musculoskeletal: No lower extremity tenderness nor edema. No gross deformities of extremities.  Ambulatory without difficulty.   Neurologic:  Normal speech and language. No gross focal neurologic deficits are appreciated.  Skin:  Skin is warm, dry and intact. Psychiatric: Mood and affect are normal. Speech and behavior are normal.  He shows good insight into his addiction and the effect it has on his body but poor judgment in terms of dealing with his  feelings and emotions, his angry outbursts in the emergency department for initially being given a hallway bed, etc.  Admits to homicidal thoughts towards others including specifically his cousin.  ____________________________________________   LABS (all labs ordered are listed, but only abnormal results are displayed)  Labs Reviewed  SALICYLATE LEVEL - Abnormal; Notable for the following components:      Result Value   Salicylate Lvl <7.0 (*)    All other components within normal limits  ACETAMINOPHEN LEVEL - Abnormal; Notable for the following components:   Acetaminophen (Tylenol), Serum <10 (*)    All other components within normal limits  RESPIRATORY PANEL BY RT PCR (FLU A&B, COVID)  COMPREHENSIVE METABOLIC PANEL  ETHANOL  CBC  URINE DRUG SCREEN, QUALITATIVE ([facility] ONLY)   ____________________________________________  EKG  No indication for emergent EKG ____________________________________________  RADIOLOGY Ursula Alert, personally viewed and  evaluated these images (plain radiographs) as part of my medical decision making, as well as reviewing the written report by the radiologist.  ED MD interpretation: No indication for emergent imaging  Official radiology report(s): No results found.  ____________________________________________   PROCEDURES   Procedure(s) performed (including Critical Care):  Procedures   ____________________________________________   INITIAL IMPRESSION / MDM / ASSESSMENT AND PLAN / ED COURSE  As part of my medical decision making, I reviewed the following data within the [place] notes reviewed and incorporated, Labs reviewed , Old chart reviewed, A consult was requested and obtained from this/these consultant(s) (Psychiatry), Notes from prior ED visits and [place] Controlled Substance Database   Differential diagnosis includes, but is not limited to, chronic left hip pain, possible drug-seeking behavior,  substance-induced mood disorder, severe recurrent depression, personality disorder, adjustment disorder.  The patient has no evidence of an acute medical emergency.  He has been thoroughly evaluated by multiple emergency department physicians and I reviewed the medical record which even indicates that he saw orthopedics while he was hospitalized relatively recently (within the last month) and they recommended NSAIDs and methocarbamol.  He has received Toradol IM in the past with good success.  He has been given opioids (Percocet) in the past but I do not think that would be beneficial or indicated for him at this time.  Additionally, a review of the [place] controlled substance database reveals a number of small prescriptions for controlled substances, most recently by [name] the psychiatrist about 2 to 3 weeks ago.  I do not feel they are indicated at this time.  He refused the intramuscular Toradol but excepted methocarbamol 1.5 g p.o. and ibuprofen 600 mg p.o.  Review of the medical record indicates that he has been admitted multiple times even over the last few weeks to the psychiatry service.  I will consult psychiatry and TTS but at this point he is here voluntarily and is questionable whether he meets involuntary commitment criteria.  His lab work is notable for normal CMP, negative ethanol, normal CBC, negative salicylate and acetaminophen levels.  He has not yet provided urine drug screen but admits to cocaine use.  He agrees to COVID-19 testing.  The patient has been placed in psychiatric observation due to the need to provide a safe environment for the patient while obtaining psychiatric consultation and evaluation, as well as ongoing medical and medication management to treat the patient's condition.  The patient has not been placed under full IVC at this time.        Clinical Course as of Feb 11 601  Thu Feb 12, 2020  [id number] Discussed case in person with Kennyth Lose with  psychiatry.  She plans for overnight observation and reassessment by psychiatry team in the morning.   [CF]    Clinical Course User Index [CF] Hinda Kehr, MD     ____________________________________________  FINAL CLINICAL IMPRESSION(S) / ED DIAGNOSES  Final diagnoses:  Cocaine abuse ([place])  Substance induced mood disorder ([place])     MEDICATIONS GIVEN DURING THIS VISIT:  Medications  citalopram (CELEXA) tablet 10 mg (has no administration in time range)  gabapentin (NEURONTIN) capsule 300 mg (has no administration in time range)  mirtazapine (REMERON) tablet 30 mg (has no administration in time range)  naproxen (NAPROSYN) tablet 500 mg (has no administration in time range)  pantoprazole (PROTONIX) EC tablet 80 mg (has no administration in time range)  methocarbamol (ROBAXIN) tablet 1,500 mg (1,500 mg Oral Given  02/12/20 0343)  ibuprofen (ADVIL) tablet 600 mg (600 mg Oral Given 02/12/20 0343)     ED Discharge Orders    None      *Please note:  Keino Placencia was evaluated in Emergency Department on 02/12/2020 for the symptoms described in the history of present illness. He was evaluated in the context of the global COVID-19 pandemic, which necessitated consideration that the patient might be at risk for infection with the SARS-CoV-2 virus that causes COVID-19. Institutional protocols and algorithms that pertain to the evaluation of patients at risk for COVID-19 are in a state of rapid change based on information released by regulatory bodies including the CDC and federal and state organizations. These policies and algorithms were followed during the patient's care in the ED.  Some ED evaluations and interventions may be delayed as a result of limited staffing during and after the pandemic.*  Note:  This document was prepared using Dragon voice recognition software and may include unintentional dictation errors.   Hinda Kehr, MD 02/12/20 [phone]

## 2020-02-12 NOTE — ED Notes (Addendum)
Pt dressed into hospital provided attire via this RN and EDT, Caitlyn.  Pt belongings place in labeled bag and handed off to quad RN, Deneise Lever.  Belongings include:  Economist Down Polo Shirt Jeans White Tennis Shoes Underwear  Cell Phone Black Belt Wallet [employer]

## 2020-02-12 NOTE — ED Notes (Signed)
Pt discharged home. VS stable. All belongings returned to patient. Discharge instructions given to patient.

## 2020-02-12 NOTE — ED Triage Notes (Addendum)
Patient states that people are getting on his nerves and that he wanted to hurt his cousin. Patient denies SI.  Patient states that he is having left hip pain. Patient states that the pain is chronic. Patient states that he does not want to be seen in psych until he is evaluated for his hip pain. Patient states that he used cocaine today.
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
## 2020-02-12 NOTE — ED Notes (Signed)
On arrival to unit, pt requests snack, pt is educated by this nurse on timeline and rules of [place]. Pt argues with nurse stating he was told by other nurse coming over here he could have snack, pt told time for breakfast trays to arrive and he then states, "this is why I am mad, I haven't eaten anything all night and y'all won't let me eat anything. Let me just have one of those lemon iceys." Pt states all this despite receiving sandwich tray and drink prior to coming to [facility]. Pt informed again that he can have water until breakfast, which he replies, "I don't want no damn water." Pt walks into day room, sits on chair and states, "give me one of those barf bags, I am going to throw up." This nurse provides pt with one. Pt then requests remote for tv which is provided and pt is currently laying in bed with lights off. Will continue to monitor.
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
## 2020-02-12 NOTE — ED Triage Notes (Signed)
Pt in via POV, reports worsening depression, anxiety, thoughts of wanting to harm others, states, "I just dont want to live anymore. I know I need help, that's what I'm trying to do now."  Restless in triage, NAD noted at this time.

## 2020-02-12 NOTE — ED Notes (Signed)
Pt to [facility] and became very argumentative demanding a room.  States, he willbecome violent and require a room if hes placed out into the hallway,  Placed him in 21 and he continues to argue, cursing.

## 2020-02-12 NOTE — ED Notes (Signed)
Meal tray placed in room 

## 2020-02-12 NOTE — ED Notes (Signed)
Patient dressed out by this RN and Alyssa EDT. Patient belongings tennis shoes, socks, 2 shirts, jeans, belt, book bag, underwear and cell phone.

## 2020-02-12 NOTE — ED Notes (Signed)
Pt refused to sign for discharge 

## 2020-02-12 NOTE — ED Provider Notes (Signed)
Patient seen by psychiatry. Will plan on discharging with outpatient detox information   Nance Pear, MD 02/12/20 1026

## 2020-02-13 DIAGNOSIS — F609 Personality disorder, unspecified: Secondary | ICD-10-CM

## 2020-02-13 DIAGNOSIS — F1994 Other psychoactive substance use, unspecified with psychoactive substance-induced mood disorder: Secondary | ICD-10-CM

## 2020-02-13 DIAGNOSIS — M719 Bursopathy, unspecified: Secondary | ICD-10-CM

## 2020-02-13 NOTE — ED Notes (Signed)
Pt asked for saltines, chips and ginger ale. Left at bedside for patient since he is currently sleeping.

## 2020-02-13 NOTE — ED Notes (Signed)
Pt refused VS and to sign for discharge.

## 2020-02-13 NOTE — ED Notes (Signed)
Pt discharged home. All belongings returned to patient. Discahrge instructions given to patient.  Pt agitated with disposition, but was cooperative with discharge.

## 2020-02-13 NOTE — ED Provider Notes (Signed)
Emergency Medicine Observation Re-evaluation Note  Samuel Molina is a 59 y.o. male, seen on rounds today.  Pt initially presented to the ED for complaints of Psychiatric Evaluation Currently, the patient is resting.  Physical Exam  BP (!) 128/52 (BP Location: Right Arm)   Pulse 61   Temp 97.8 F (36.6 C) (Oral)   Resp 20   Ht 1.854 m (6\' 1" )   Wt 98.4 kg   SpO2 99%   BMI 28.63 kg/m  Physical Exam Gen:  No acute distress Resp:  Breathing easily and comfortably, no accessory muscle usage Neuro:  Moving all four extremities, no gross focal neuro deficits Psych:  Resting currently, generally calm and cooperative when awake, but becomes somewhat belligerent if he does not get what he wants or is requesting.  ED Course / MDM  EKG:    I have reviewed the labs performed to date as well as medications administered while in observation.  Recent changes in the last 24 hours include voluntary return to the emergency department after recent ED and psychiatric evaluation and discharge.  Plan  Current plan is for psychiatric reassessment.  There is concern for malingering or secondary gain but the patient has been admitted to psychiatry in the past.  He will be reassessed today by psychiatry.  Patient is not under full IVC at this time.   Hinda Kehr, MD 02/13/20 [phone]

## 2020-02-13 NOTE — Consult Note (Signed)
  Psychiatry brief consult note: Patient seen and discussed with emergency room physician.  Chart reviewed.  Patient does not meet   criteria for commitment.  Options were offered to him about alternative treatment which he has so far refused to consider.  Recommend patient can be discharged from the emergency room.  Full note to follow

## 2020-02-13 NOTE — ED Notes (Signed)
Pt agitated he had to move into a hallway bed.  Refused to get up and walk so bed was rolled to 19 hall.

## 2020-02-13 NOTE — Consult Note (Signed)
[facility] Face-to-Face Psychiatry Consult   Reason for Consult: Consult for this 59 year old man with cocaine abuse who came back into the emergency room instantly after being released yesterday Referring Physician: Bland Span Patient Identification: Samuel Molina MRN:  [id number] Principal Diagnosis: Substance induced mood disorder ([place]) Diagnosis:  Principal Problem:   Substance induced mood disorder ([place]) Active Problems:   Cocaine abuse ([place])   Personality disorder ([place])   Bursitis   Total Time spent with patient: 1 hour  Subjective:   Samuel Molina is a 59 y.o. male patient admitted with "nobody is acting like I am worth anything".  HPI: Patient seen chart reviewed.  Patient had just been released from the emergency room on Thursday evening and reentered the emergency room waiting area in such a short time it seems apparent he did not even leave the grounds.  Patient was reporting having suicidal thoughts and feeling that he needed to be admitted to the psychiatric ward.  On interview today the patient had multiple complaints related to his treatment in the emergency room.  He takes things as a personal slight extremely easily and seems to have little empathy or feeling for what is going on around him.  He claims that he has suicidal ideation but does not have any specific plan.  Notably his previous hospitalization just the day before had not been predicated on suicidal ideation.  It had been predicated on anger and hostility after cocaine abuse.  Patient has not shown any tendency to violence to others or self-harm.  He describes his emotions as having gigantic swings up and down but this is not evident in his affect which is cranky and irritable but certainly not psychotically pathological.  He claims to be hopeless but describes at some length his plan for the future at times.  Patient has been resistant to all attempts to discuss any alternative plans for treatment or substance abuse other  than admission to the psychiatric ward.  Past Psychiatric History: Patient had 2 hospitalizations on our unit in close succession just recently.  On both occasions he came in after abusing cocaine and talked about having suicidal and homicidal ideation.  On both occasions he was very little engaged in treatment.  Mostly stayed in his room.  Resisted many efforts to discuss appropriate treatment and was overly focused on wanting narcotic pain medicine.  Much effort was put into attempts to find him inpatient substance abuse treatment by the staff but he left prior to being referred on both occasions and relapsed into drug abuse.  No known history of suicide attempts.  Risk to Self: Suicidal Ideation: No Suicidal Intent: No Is patient at risk for suicide?: No Suicidal Plan?: No Access to Means: No What has been your use of drugs/alcohol within the last 12 months?: Crack Cocaine How many times?: 0 Other Self Harm Risks: None Triggers for Past Attempts: None known Intentional Self Injurious Behavior: None Risk to Others: Homicidal Ideation: Yes-Currently Present Thoughts of Harm to Others: Yes-Currently Present Comment - Thoughts of Harm to Others: Pt alluded to wanting to harm everyone that frustrates him Current Homicidal Intent: No Current Homicidal Plan: No Access to Homicidal Means: No Describe Access to Homicidal Means: None noted Identified Victim: People that get on his nerves History of harm to others?: No Assessment of Violence: None Noted Violent Behavior Description: n/a Does patient have access to weapons?: No Criminal Charges Pending?: No Does patient have a court date: No Prior Inpatient Therapy: Prior Inpatient Therapy: Yes Prior  Therapy Dates: 01/2020, 12/2019 Prior Therapy Facilty/Provider(s): [facility] Reason for Treatment: Major Depression,  Personality Disorder Prior Outpatient Therapy: Prior Outpatient Therapy: No Does patient have an ACCT team?: No Does patient  have Intensive In-House Services?  : No Does patient have [facility] services? : No Does patient have P4CC services?: No  Past Medical History:  Past Medical History:  Diagnosis Date  . Anxiety   . Depression   . GERD (gastroesophageal reflux disease)   . Obesity   . Substance abuse [facility])     Past Surgical History:  Procedure Laterality Date  . ESOPHAGOGASTRODUODENOSCOPY (EGD) WITH PROPOFOL N/A 09/20/2017   Procedure: ESOPHAGOGASTRODUODENOSCOPY (EGD) WITH PROPOFOL;  Surgeon: Lin Landsman, MD;  Location: [place];  Service: Gastroenterology;  Laterality: N/A;  . KNEE SURGERY Right    Family History: No family history on file. Family Psychiatric  History: None specifically identified Social History:  Social History   Substance and Sexual Activity  Alcohol Use Not Currently     Social History   Substance and Sexual Activity  Drug Use Yes  . Types: Cocaine    Social History   Socioeconomic History  . Marital status: Single    Spouse name: Not on file  . Number of children: Not on file  . Years of education: Not on file  . Highest education level: Not on file  Occupational History  . Not on file  Tobacco Use  . Smoking status: Current Every Day Smoker    Packs/day: 0.50    Types: Cigarettes  . Smokeless tobacco: Never Used  Vaping Use  . Vaping Use: Never used  Substance and Sexual Activity  . Alcohol use: Not Currently  . Drug use: Yes    Types: Cocaine  . Sexual activity: Not on file  Other Topics Concern  . Not on file  Social History Narrative  . Not on file   Social Determinants of Health   Financial Resource Strain:   . Difficulty of Paying Living Expenses: Not on file  Food Insecurity:   . Worried About Charity fundraiser in the Last Year: Not on file  . Ran Out of Food in the Last Year: Not on file  Transportation Needs:   . Lack of Transportation (Medical): Not on file  . Lack of Transportation (Non-Medical): Not on file  Physical  Activity:   . Days of Exercise per Week: Not on file  . Minutes of Exercise per Session: Not on file  Stress:   . Feeling of Stress : Not on file  Social Connections:   . Frequency of Communication with Friends and Family: Not on file  . Frequency of Social Gatherings with Friends and Family: Not on file  . Attends Religious Services: Not on file  . Active Member of Clubs or Organizations: Not on file  . Attends Archivist Meetings: Not on file  . Marital Status: Not on file   Additional Social History:    Allergies:   Allergies  Allergen Reactions  . Acetaminophen Itching, Nausea And Vomiting and Rash    Labs:  Results for orders placed or performed during the hospital encounter of 02/12/20 (from the past 48 hour(s))  Comprehensive metabolic panel     Status: Abnormal   Collection Time: 02/12/20  5:07 PM  Result Value Ref Range   Sodium 139 135 - 145 mmol/L   Potassium 4.1 3.5 - 5.1 mmol/L   Chloride 106 98 - 111 mmol/L   CO2 25  22 - 32 mmol/L   Glucose, Bld 106 (H) 70 - 99 mg/dL    Comment: Glucose reference range applies only to samples taken after fasting for at least 8 hours.   BUN 18 6 - 20 mg/dL   Creatinine, Ser 1.00 0.61 - 1.24 mg/dL   Calcium 9.4 8.9 - 10.3 mg/dL   Total Protein 7.4 6.5 - 8.1 g/dL   Albumin 3.8 3.5 - 5.0 g/dL   AST 20 15 - 41 U/L   ALT 18 0 - 44 U/L   Alkaline Phosphatase 91 38 - 126 U/L   Total Bilirubin 0.5 0.3 - 1.2 mg/dL   GFR, Estimated >60 >60 mL/min    Comment: (NOTE) Calculated using the CKD-EPI Creatinine Equation (2021)    Anion gap 8 5 - 15    Comment: Performed at [facility], [address] [place], [place] [address]  Ethanol     Status: None   Collection Time: 02/12/20  5:07 PM  Result Value Ref Range   Alcohol, Ethyl (B) <10 <10 mg/dL    Comment: (NOTE) Lowest detectable limit for serum alcohol is 10 mg/dL.  For medical purposes only. Performed at [facility], [address]  [place], [place] [address]   Salicylate level     Status: Abnormal   Collection Time: 02/12/20  5:07 PM  Result Value Ref Range   Salicylate Lvl <7.6 (L) 7.0 - 30.0 mg/dL    Comment: Performed at [facility], [address] [place], [place] [address]  Acetaminophen level     Status: Abnormal   Collection Time: 02/12/20  5:07 PM  Result Value Ref Range   Acetaminophen (Tylenol), Serum <10 (L) 10 - 30 ug/mL    Comment: (NOTE) Therapeutic concentrations vary significantly. A range of 10-30 ug/mL  may be an effective concentration for many patients. However, some  are best treated at concentrations outside of this range. Acetaminophen concentrations >150 ug/mL at 4 hours after ingestion  and >50 ug/mL at 12 hours after ingestion are often associated with  toxic reactions.  Performed at [facility], [address] [place], [place] [address]   cbc     Status: None   Collection Time: 02/12/20  5:07 PM  Result Value Ref Range   WBC 8.5 4.0 - 10.5 K/uL   RBC 5.45 4.22 - 5.81 MIL/uL   Hemoglobin 14.4 13.0 - 17.0 g/dL   HCT 44.0 39 - 52 %   MCV 80.7 80.0 - 100.0 fL   MCH 26.4 26.0 - 34.0 pg   MCHC 32.7 30.0 - 36.0 g/dL   RDW 14.4 11.5 - 15.5 %   Platelets 313 150 - 400 K/uL   nRBC 0.0 0.0 - 0.2 %    Comment: Performed at [facility], [address] [place], [place] [address]    Current Facility-Administered Medications  Medication Dose Route Frequency Provider Last Rate Last Admin  . alum & mag hydroxide-simeth (MAALOX/MYLANTA) 200-200-20 MG/5ML suspension 30 mL  30 mL Oral Q6H PRN Duffy Bruce, MD      . citalopram (CELEXA) tablet 10 mg  10 mg Oral Daily Duffy Bruce, MD   10 mg at 02/13/20 1237  . gabapentin (NEURONTIN) capsule 300 mg  300 mg Oral TID Duffy Bruce, MD   300 mg at 02/13/20 1237  . mirtazapine (REMERON) tablet 30 mg  30 mg Oral QHS Duffy Bruce, MD   30 mg at 02/12/20 2147  . pantoprazole (PROTONIX) EC tablet  80 mg  80 mg  Oral Daily Duffy Bruce, MD   80 mg at 02/13/20 1237   Current Outpatient Medications  Medication Sig Dispense Refill  . citalopram (CELEXA) 10 MG tablet Take 1 tablet (10 mg total) by mouth daily. 30 tablet 1  . gabapentin (NEURONTIN) 300 MG capsule Take 1 capsule (300 mg total) by mouth 3 (three) times daily. 90 capsule 1  . mirtazapine (REMERON) 30 MG tablet Take 1 tablet (30 mg total) by mouth at bedtime. 30 tablet 1  . naproxen (NAPROSYN) 500 MG tablet Take 1 tablet (500 mg total) by mouth 2 (two) times daily with a meal. 60 tablet 0  . pantoprazole (PROTONIX) 40 MG tablet Take 2 tablets (80 mg total) by mouth daily. 60 tablet 1    Musculoskeletal: Strength & Muscle Tone: within normal limits Gait & Station: normal Patient leans: N/A  Psychiatric Specialty Exam: Physical Exam Vitals and nursing note reviewed.  Constitutional:      Appearance: He is well-developed.  HENT:     Head: Normocephalic and atraumatic.  Eyes:     Conjunctiva/sclera: Conjunctivae normal.     Pupils: Pupils are equal, round, and reactive to light.  Cardiovascular:     Heart sounds: Normal heart sounds.  Pulmonary:     Effort: Pulmonary effort is normal.  Abdominal:     Palpations: Abdomen is soft.  Musculoskeletal:        General: Normal range of motion.     Cervical back: Normal range of motion.  Skin:    General: Skin is warm and dry.  Neurological:     General: No focal deficit present.     Mental Status: He is alert.  Psychiatric:        Attention and Perception: Attention normal.        Mood and Affect: Mood is anxious. Affect is labile and angry.        Speech: Speech normal.        Behavior: Behavior is agitated. Behavior is not aggressive.        Thought Content: Thought content is not paranoid. Thought content includes suicidal ideation. Thought content does not include homicidal ideation. Thought content does not include suicidal plan.        Cognition and Memory: Cognition  normal.        Judgment: Judgment is inappropriate.     Review of Systems  Constitutional: Negative.   HENT: Negative.   Eyes: Negative.   Respiratory: Negative.   Cardiovascular: Negative.   Gastrointestinal: Negative.   Musculoskeletal: Negative.   Skin: Negative.   Neurological: Negative.   Psychiatric/Behavioral: Positive for behavioral problems, dysphoric mood and suicidal ideas.    Blood pressure (!) 128/52, pulse 61, temperature 97.8 F (36.6 C), temperature source Oral, resp. rate 20, height 6\' 1"  (1.854 m), weight 98.4 kg, SpO2 99 %.Body mass index is 28.63 kg/m.  General Appearance: Casual  Eye Contact:  Good  Speech:  Clear and Coherent  Volume:  Normal  Mood:  Irritable  Affect:  Appropriate  Thought Process:  Coherent  Orientation:  Full (Time, Place, and Person)  Thought Content:  Rumination  Suicidal Thoughts:  Yes.  without intent/plan  Homicidal Thoughts:  No  Memory:  Immediate;   Fair Recent;   Fair Remote;   Fair  Judgement:  Impaired  Insight:  Shallow  Psychomotor Activity:  Normal  Concentration:  Concentration: Fair  Recall:  [employer] of Knowledge:  Fair  Language:  Fair  Akathisia:  No  Handed:  Right  AIMS (if indicated):     Assets:  Desire for Improvement Physical Health  ADL's:  Intact  Cognition:  WNL  Sleep:        Treatment Plan Summary: Plan This is a 60 year old man who came back to the emergency room immediately after discharge without any time for any new circumstances.  I spent some time with him discussing how the inpatient psychiatric unit was not the best situation for meeting his needs.  He has not participated on the unit the last 2 hospitalizations despite their both being relatively long.  He has focused excessively on pain medication.  He has not really engaged in a way to suggest that he is ready to make life changes.  I tried to do some supportive counseling and point out to him that his mood is very dependent on his  feeling that people are not taking him seriously.  I advised him that I thought that if he got back to work got himself stable on his feet and started taking care of himself and stayed away from cocaine he would feel much better.  Patient was argumentative with everyone of these points.  He insisted to me that he was currently homeless.  I suggested we could refer [facility] to [facility] which can serve as a place to live as well as a place to get back on his feet with working.  He became irate about that 1 2.  Patient is aware of the available resources in the community and will be reminded of the possibility of going to [place] for substance abuse treatment.  Despite his talking about suicidal ideation I do not think the patient is at very high risk of suicidal behavior.  He has not actually ever tried to harm himself in the past and seems to actually value himself in his life very much.  At this point I think that there is no benefit to further hospital care.  He can be released from the emergency room at the discretion of the emergency room physician.  Disposition: No evidence of imminent risk to self or others at present.   Patient does not meet criteria for psychiatric inpatient admission. Supportive therapy provided about ongoing stressors. Discussed crisis plan, support from social network, calling 911, coming to the Emergency Department, and calling Suicide Hotline.  Alethia Berthold, MD 02/13/2020 2:51 PM

## 2020-02-14 ENCOUNTER — Other Ambulatory Visit: Payer: Self-pay

## 2020-02-14 ENCOUNTER — Emergency Department
Admission: EM | Admit: 2020-02-14 | Discharge: 2020-02-16 | Disposition: A | Discharge status: Home / Self Care | Payer: Medicaid Other | Attending: Emergency Medicine | Admitting: Emergency Medicine

## 2020-02-14 DIAGNOSIS — T50902A Poisoning by unspecified drugs, medicaments and biological substances, intentional self-harm, initial encounter: Secondary | ICD-10-CM | POA: Insufficient documentation

## 2020-02-14 DIAGNOSIS — F419 Anxiety disorder, unspecified: Secondary | ICD-10-CM | POA: Insufficient documentation

## 2020-02-14 DIAGNOSIS — Z79899 Other long term (current) drug therapy: Secondary | ICD-10-CM | POA: Insufficient documentation

## 2020-02-14 DIAGNOSIS — F17210 Nicotine dependence, cigarettes, uncomplicated: Secondary | ICD-10-CM | POA: Insufficient documentation

## 2020-02-14 DIAGNOSIS — F609 Personality disorder, unspecified: Secondary | ICD-10-CM | POA: Diagnosis present

## 2020-02-14 DIAGNOSIS — F1410 Cocaine abuse, uncomplicated: Secondary | ICD-10-CM | POA: Diagnosis present

## 2020-02-14 DIAGNOSIS — F332 Major depressive disorder, recurrent severe without psychotic features: Secondary | ICD-10-CM | POA: Insufficient documentation

## 2020-02-14 DIAGNOSIS — F1994 Other psychoactive substance use, unspecified with psychoactive substance-induced mood disorder: Secondary | ICD-10-CM | POA: Diagnosis present

## 2020-02-14 DIAGNOSIS — Z20822 Contact with and (suspected) exposure to covid-19: Secondary | ICD-10-CM | POA: Insufficient documentation

## 2020-02-14 DIAGNOSIS — Y9289 Other specified places as the place of occurrence of the external cause: Secondary | ICD-10-CM | POA: Insufficient documentation

## 2020-02-14 LAB — CBC WITH DIFFERENTIAL/PLATELET
Abs Immature Granulocytes: 0.03 10*3/uL (ref 0.00–0.07)
Basophils Absolute: 0 10*3/uL (ref 0.0–0.1)
Basophils Relative: 1 %
Eosinophils Absolute: 0.1 10*3/uL (ref 0.0–0.5)
Eosinophils Relative: 2 %
HCT: 42.9 % (ref 39.0–52.0)
Hemoglobin: 13.9 g/dL (ref 13.0–17.0)
Immature Granulocytes: 0 %
Lymphocytes Relative: 26 %
Lymphs Abs: 2.2 10*3/uL (ref 0.7–4.0)
MCH: 26 pg (ref 26.0–34.0)
MCHC: 32.4 g/dL (ref 30.0–36.0)
MCV: 80.2 fL (ref 80.0–100.0)
Monocytes Absolute: 0.9 10*3/uL (ref 0.1–1.0)
Monocytes Relative: 10 %
Neutro Abs: 5.2 10*3/uL (ref 1.7–7.7)
Neutrophils Relative %: 61 %
Platelets: 331 10*3/uL (ref 150–400)
RBC: 5.35 MIL/uL (ref 4.22–5.81)
RDW: 14.5 % (ref 11.5–15.5)
WBC: 8.5 10*3/uL (ref 4.0–10.5)
nRBC: 0 % (ref 0.0–0.2)

## 2020-02-14 LAB — ETHANOL: Alcohol, Ethyl (B): <10 mg/dL (ref <10)

## 2020-02-14 MED ORDER — ALUM & MAG HYDROXIDE-SIMETH 200-200-20 MG/5ML PO SUSP: 30.0000 mL | Freq: Four times a day (QID) | ORAL | Status: DC | PRN

## 2020-02-14 MED ORDER — ONDANSETRON HCL 4 MG PO TABS
4.0000 mg | ORAL_TABLET | Freq: Three times a day (TID) | ORAL | Status: DC | PRN
  Administered 2020-02-14: 4 mg via ORAL
  Filled 2020-02-14: qty 1

## 2020-02-14 NOTE — ED Triage Notes (Signed)
Pt bought in by EMS, per EMS patient states that he took around 20 unknown pills from friends medicine cabinet in an attempt to harm himself. Friend has history of seizures. Pt complaint of dizziness and lethargy at this time. VSS, no acute distress. Was D/C'd about 3 days ago for suicidal ideation.

## 2020-02-14 NOTE — ED Provider Notes (Signed)
[facility] Emergency Department Provider Note  ____________________________________________  Time seen: Approximately 11:42 PM  I have reviewed the triage vital signs and the nursing notes.   HISTORY  Chief Complaint Suicide Attempt    HPI Kaiea Esselman is a 59 y.o. male history of anxiety depression substance abuse and GERD and personality disorder who is brought to the ED tonight due to reported suicide attempt in which she took about 20 unknown pills from a friend's medicine cabinet.  Patient states that he was feeling hopeless and did not want to live anymore.  He does not know what the pills were but thinks they were beige ovals.  Denies any chest pain shortness of breath or abdominal pain currently.  Unknown time of ingestion.    Past Medical History:  Diagnosis Date  . Anxiety   . Depression   . GERD (gastroesophageal reflux disease)   . Obesity   . Substance abuse [facility])      Patient Active Problem List   Diagnosis Date Noted  . Personality disorder ([place]) 02/13/2020  . Bursitis 02/13/2020  . Substance induced mood disorder ([place]) 02/12/2020  . Severe recurrent major depression without psychotic features ([place]) 01/21/2020  . Cocaine abuse ([place]) 01/21/2020  . Hip pain 01/21/2020  . Major depression 01/20/2020  . Gastroesophageal reflux disease with esophagitis   . Morbid obesity ([place]) 09/19/2017  . Arthritis of knee 11/03/2015     Past Surgical History:  Procedure Laterality Date  . ESOPHAGOGASTRODUODENOSCOPY (EGD) WITH PROPOFOL N/A 09/20/2017   Procedure: ESOPHAGOGASTRODUODENOSCOPY (EGD) WITH PROPOFOL;  Surgeon: Lin Landsman, MD;  Location: [place];  Service: Gastroenterology;  Laterality: N/A;  . KNEE SURGERY Right      Prior to Admission medications   Medication Sig Start Date End Date Taking? Authorizing Provider  citalopram (CELEXA) 10 MG tablet Take 1 tablet (10 mg total) by mouth daily. 02/03/20  Yes Clapacs,  [name], MD  gabapentin (NEURONTIN) 300 MG capsule Take 1 capsule (300 mg total) by mouth 3 (three) times daily. 02/03/20  Yes Clapacs, [name], MD  mirtazapine (REMERON) 30 MG tablet Take 1 tablet (30 mg total) by mouth at bedtime. 02/03/20  Yes Clapacs, [name], MD  naproxen (NAPROSYN) 500 MG tablet Take 1 tablet (500 mg total) by mouth 2 (two) times daily with a meal. 02/03/20  Yes Clapacs, [name], MD  pantoprazole (PROTONIX) 40 MG tablet Take 2 tablets (80 mg total) by mouth daily. 02/04/20  Yes Clapacs, [name], MD  omeprazole (PRILOSEC OTC) 20 MG tablet Take 2 tablets (40 mg total) by mouth 2 (two) times daily before a meal. 09/19/17 01/29/19  Lin Landsman, MD     Allergies Acetaminophen   History reviewed. No pertinent family history.  Social History Social History   Tobacco Use  . Smoking status: Current Every Day Smoker    Packs/day: 0.50    Types: Cigarettes  . Smokeless tobacco: Never Used  Vaping Use  . Vaping Use: Never used  Substance Use Topics  . Alcohol use: Not Currently  . Drug use: Yes    Types: Cocaine    Review of Systems  Constitutional:   No fever or chills.  ENT:   No sore throat. No rhinorrhea. Cardiovascular:   No chest pain or syncope. Respiratory:   No dyspnea or cough. Gastrointestinal:   Negative for abdominal pain, vomiting and diarrhea.  Musculoskeletal:   Negative for focal pain or swelling All other systems reviewed and are negative  except as documented above in ROS and HPI.  ____________________________________________   PHYSICAL EXAM:  VITAL SIGNS: ED Triage Vitals  Enc Vitals Group     BP 02/14/20 2215 (!) 165/81     Pulse Rate 02/14/20 2215 62     Resp 02/14/20 2215 18     Temp 02/14/20 2215 98.7 F (37.1 C)     Temp Source 02/14/20 2215 Oral     SpO2 02/14/20 2215 98 %     Weight 02/14/20 2216 216 lb 0.8 oz (98 kg)     Height 02/14/20 2216 6\' 1"  (1.854 m)     Head Circumference --      Peak Flow --      Pain Score  02/14/20 2215 0     Pain Loc --      Pain Edu? --      Excl. in [place]? --     Vital signs reviewed, nursing assessments reviewed.   Constitutional:   Alert and oriented. Non-toxic appearance. Eyes:   Conjunctivae are normal. EOMI. PERRL. ENT      Head:   Normocephalic and atraumatic.      Nose:   Wearing a mask.      Mouth/Throat:   Wearing a mask.      Neck:   No meningismus. Full ROM. Hematological/Lymphatic/Immunilogical:   No cervical lymphadenopathy. Cardiovascular:   RRR. Symmetric bilateral radial and DP pulses.  No murmurs. Cap refill less than 2 seconds. Respiratory:   Normal respiratory effort without tachypnea/retractions. Breath sounds are clear and equal bilaterally. No wheezes/rales/rhonchi. Gastrointestinal:   Soft and nontender. Non distended. There is no CVA tenderness.  No rebound, rigidity, or guarding.  Musculoskeletal:   Normal range of motion in all extremities. No joint effusions.  No lower extremity tenderness.  No edema. Neurologic:   Normal speech and language.  Motor grossly intact. No acute focal neurologic deficits are appreciated.  Skin:    Skin is warm, dry and intact. No rash noted.  No petechiae, purpura, or bullae.  ____________________________________________    LABS (pertinent positives/negatives) (all labs ordered are listed, but only abnormal results are displayed) Labs Reviewed  URINE DRUG SCREEN, QUALITATIVE ([facility] ONLY)  ACETAMINOPHEN LEVEL  CBC WITH DIFFERENTIAL/PLATELET  COMPREHENSIVE METABOLIC PANEL  ETHANOL  LIPASE, BLOOD  SALICYLATE LEVEL   ____________________________________________   EKG  Interpreted by me Sinus rhythm rate of 62, left axis, normal intervals.  Normal QRS and ST segments.  Inferolateral T wave inversions which are unchanged compared to prior EKG November 15, 2018 and previous.  ____________________________________________    RADIOLOGY  No results  found.  ____________________________________________   PROCEDURES Procedures  ____________________________________________    CLINICAL IMPRESSION / ASSESSMENT AND PLAN / ED COURSE  Medications ordered in the ED: Medications - No data to display  Pertinent labs & imaging results that were available during my care of the patient were reviewed by me and considered in my medical decision making (see chart for details).  Alezander Dimaano was evaluated in Emergency Department on 02/14/2020 for the symptoms described in the history of present illness. He was evaluated in the context of the global COVID-19 pandemic, which necessitated consideration that the patient might be at risk for infection with the SARS-CoV-2 virus that causes COVID-19. Institutional protocols and algorithms that pertain to the evaluation of patients at risk for COVID-19 are in a state of rapid change based on information released by regulatory bodies including the CDC and federal and state organizations. These policies and  algorithms were followed during the patient's care in the ED.   Patient presents with intentional medication overdose in a suicide attempt.  IVC by law enforcement, will continue pending psychiatry evaluation.  Vital signs are normal, exam is benign.  Will observe for signs of toxicity until roughly 4:00am 10/24, at which point pt can be medically cleared for psychiatric disposition.  The patient has been placed in psychiatric observation due to the need to provide a safe environment for the patient while obtaining psychiatric consultation and evaluation, as well as ongoing medical and medication management to treat the patient's condition.  The patient has been placed under full IVC at this time.       ____________________________________________   FINAL CLINICAL IMPRESSION(S) / ED DIAGNOSES    Final diagnoses:  Acute drug overdose, intentional self-harm, initial encounter [facility])     ED  Discharge Orders    None      Portions of this note were generated with dragon dictation software. Dictation errors may occur despite best attempts at proofreading.   Carrie Mew, MD 02/14/20 [phone]

## 2020-02-14 NOTE — ED Notes (Signed)
Patient becoming agitated, stating 'I need the doctor back in here right now!' This RN advising patient that physician cannot come to the bedside immediately because he is in another patient room and asked patient what can be done to help by this RN. Patient stating "Why the hell can't I just go over there?" Pointing to ED psych hold area. This RN educated patient that he is a medical patient at the moment due to his ingestion of unknown pills. Patient thanked this Therapist, sports and stated that he was sorry for becoming irritated.

## 2020-02-14 NOTE — ED Notes (Signed)
Patient provided with italian ice, applesauce, and chips per request. MD approved PO intake.

## 2020-02-14 NOTE — ED Notes (Addendum)
Patient belongings removed from room for safety. Belongings include blue backpack, black tennis shoes, and grocery bag full of candy bars. ED Tech to sit with patient one-to-one.

## 2020-02-15 DIAGNOSIS — F332 Major depressive disorder, recurrent severe without psychotic features: Secondary | ICD-10-CM | POA: Diagnosis not present

## 2020-02-15 LAB — COMPREHENSIVE METABOLIC PANEL
ALT: 14 U/L (ref 0–44)
AST: 19 U/L (ref 15–41)
Albumin: 3.8 g/dL (ref 3.5–5.0)
Alkaline Phosphatase: 80 U/L (ref 38–126)
Anion gap: 7 (ref 5–15)
BUN: 17 mg/dL (ref 6–20)
CO2: 27 mmol/L (ref 22–32)
Calcium: 9 mg/dL (ref 8.9–10.3)
Chloride: 107 mmol/L (ref 98–111)
Creatinine, Ser: 0.83 mg/dL (ref 0.61–1.24)
GFR, Estimated: >60 mL/min (ref >60)
Glucose, Bld: 105 mg/dL — ABNORMAL HIGH (ref 70–99)
Potassium: 3.4 mmol/L — ABNORMAL LOW (ref 3.5–5.1)
Sodium: 141 mmol/L (ref 135–145)
Total Bilirubin: 0.5 mg/dL (ref 0.3–1.2)
Total Protein: 7.2 g/dL (ref 6.5–8.1)

## 2020-02-15 LAB — URINE DRUG SCREEN, QUALITATIVE (ARMC ONLY)
Amphetamines, Ur Screen: NOT DETECTED
Barbiturates, Ur Screen: NOT DETECTED
Benzodiazepine, Ur Scrn: NOT DETECTED
Cannabinoid 50 Ng, Ur ~~LOC~~: NOT DETECTED
Cocaine Metabolite,Ur ~~LOC~~: POSITIVE — AB
MDMA (Ecstasy)Ur Screen: NOT DETECTED
Methadone Scn, Ur: NOT DETECTED
Opiate, Ur Screen: NOT DETECTED
Phencyclidine (PCP) Ur S: NOT DETECTED
Tricyclic, Ur Screen: NOT DETECTED

## 2020-02-15 LAB — LIPASE, BLOOD: Lipase: 37 U/L (ref 11–51)

## 2020-02-15 LAB — RESPIRATORY PANEL BY RT PCR (FLU A&B, COVID)
Influenza A by PCR: NEGATIVE
Influenza B by PCR: NEGATIVE
SARS Coronavirus 2 by RT PCR: NEGATIVE

## 2020-02-15 LAB — ACETAMINOPHEN LEVEL
Acetaminophen (Tylenol), Serum: <10 ug/mL — ABNORMAL LOW (ref 10–30)
Acetaminophen (Tylenol), Serum: <10 ug/mL — ABNORMAL LOW (ref 10–30)

## 2020-02-15 LAB — SALICYLATE LEVEL
Salicylate Lvl: <7 mg/dL — ABNORMAL LOW (ref 7.0–30.0)
Salicylate Lvl: <7 mg/dL — ABNORMAL LOW (ref 7.0–30.0)

## 2020-02-15 MED ORDER — MIRTAZAPINE 15 MG PO TABS
30.0000 mg | ORAL_TABLET | Freq: Every day | ORAL | Status: DC
  Administered 2020-02-15: 30 mg via ORAL
  Filled 2020-02-15: qty 2

## 2020-02-15 MED ORDER — CITALOPRAM HYDROBROMIDE 20 MG PO TABS
10.0000 mg | ORAL_TABLET | Freq: Every day | ORAL | Status: DC
  Administered 2020-02-15 – 2020-02-16 (×2): 10 mg via ORAL
  Filled 2020-02-15 (×2): qty 1

## 2020-02-15 MED ORDER — NAPROXEN 500 MG PO TABS
500.0000 mg | ORAL_TABLET | Freq: Two times a day (BID) | ORAL | Status: DC
  Administered 2020-02-16: 500 mg via ORAL
  Filled 2020-02-15 (×3): qty 1

## 2020-02-15 MED ORDER — LORAZEPAM 1 MG PO TABS
1.0000 mg | ORAL_TABLET | Freq: Once | ORAL | Status: AC
  Administered 2020-02-15: 1 mg via ORAL
  Filled 2020-02-15: qty 1

## 2020-02-15 MED ORDER — PANTOPRAZOLE SODIUM 40 MG PO TBEC
80.0000 mg | DELAYED_RELEASE_TABLET | Freq: Every day | ORAL | Status: DC
  Administered 2020-02-15 – 2020-02-16 (×2): 80 mg via ORAL
  Filled 2020-02-15 (×2): qty 2

## 2020-02-15 MED ORDER — GABAPENTIN 300 MG PO CAPS
300.0000 mg | ORAL_CAPSULE | Freq: Three times a day (TID) | ORAL | Status: DC
  Administered 2020-02-15 – 2020-02-16 (×2): 300 mg via ORAL
  Filled 2020-02-15 (×2): qty 1

## 2020-02-15 NOTE — ED Notes (Signed)
Hourly rounding reveals patient in room. No complaints, stable, in no acute distress. Q15 minute rounds and monitoring via Security Cameras to continue. 

## 2020-02-15 NOTE — ED Notes (Signed)
One-to-one observation continued.

## 2020-02-15 NOTE — ED Notes (Signed)
Report to include Situation, Background, Assessment, and Recommendations received from [facility]. Patient alert and oriented, warm and dry, in no acute distress. Patient denies SI, HI, AVH and pain. Patient made aware of Q15 minute rounds and Engineer, drilling presence for their safety. Patient instructed to come to me with needs or concerns.

## 2020-02-15 NOTE — ED Notes (Signed)
Pt to bathroom and back in hall bed. Sitting on bed now.

## 2020-02-15 NOTE — ED Notes (Signed)
Pt moved to hall per CN request. Pt stating " I won't be in the hall". Sitter still with pt. Pt offered warm blanket. Drinking sips water.

## 2020-02-15 NOTE — ED Notes (Signed)
TTS and psych at bedside.

## 2020-02-15 NOTE — BH Assessment (Signed)
Assessment Note  Samuel Molina is a African-American 59 y.o. male who presents to the ED via EMS. Per the initial triage note, "bought in by EMS, per EMS patient states that he took around 20 unknown pills from friends medicine cabinet in an attempt to harm himself. Friend has history of seizures. Pt complaint of dizziness and lethargy at this time. VSS, no acute distress. Was D/C'd about 3 days ago for suicidal ideation".   Writer assessed patient and patient presented with extreme irritability. Patient reported that he was just discharged from [facility] a few days ago while under the care of [name]. Patient reports he doesn't believe he should have been discharged and reports that he tried to take his life by overdosing on medication today. Patient reports he was triggered to do this "by a series of events" that took place with his children. Patient reports passive HI towards his children and vaguely reported that there are some issues with their relationship. Patient endorsed the use of cocaine as early as a few days ago but denied the use of alcohol or other drugs. Patient reports his sleep has decreased but that his appetite has stayed the same. When writer asked if there were any court dates coming up, patient became irate reporting "no one has ever asked me that, so I am not sure why you are". Patient denies [place], but vaguely endorses SI/HI.   This case is being staffed with Kennyth Lose NP and Beather Arbour MD. Disposition will follow shortly.    Diagnosis: Personality Disorder, Anxiety, Depression  Past Medical History:  Past Medical History:  Diagnosis Date  . Anxiety   . Depression   . GERD (gastroesophageal reflux disease)   . Obesity   . Substance abuse [facility])     Past Surgical History:  Procedure Laterality Date  . ESOPHAGOGASTRODUODENOSCOPY (EGD) WITH PROPOFOL N/A 09/20/2017   Procedure: ESOPHAGOGASTRODUODENOSCOPY (EGD) WITH PROPOFOL;  Surgeon: Lin Landsman, MD;  Location: [facility];  Service: Gastroenterology;  Laterality: N/A;  . KNEE SURGERY Right     Family History: History reviewed. No pertinent family history.  Social History:  reports that he has been smoking cigarettes. He has been smoking about 0.50 packs per day. He has never used smokeless tobacco. He reports previous alcohol use. He reports current drug use. Drug: Cocaine.  Additional Social History:  Alcohol / Drug Use Pain Medications: See PTA Prescriptions: See PTA Over the Counter: See PTA History of alcohol / drug use?: Yes Substance #1 Name of Substance 1: Cocaine 1 - Last Use / Amount: 2 days ago  CIWA: CIWA-Ar BP: (!) 141/79 Pulse Rate: 66 COWS:    Allergies:  Allergies  Allergen Reactions  . Acetaminophen Itching, Nausea And Vomiting and Rash    Home Medications: (Not in a hospital admission)   OB/GYN Status:  No LMP for male patient.  General Assessment Data Location of Assessment: [facility] ED TTS Assessment: In system Is this a Tele or Face-to-Face Assessment?: Face-to-Face Is this an Initial Assessment or a Re-assessment for this encounter?: Initial Assessment Patient Accompanied by:: N/A Language Other than English: No Living Arrangements: Other (Comment) What gender do you identify as?: Male Marital status: Single Living Arrangements: Alone Can pt return to current living arrangement?: Yes Admission Status: Involuntary Referral Source: Self/Family/Friend  Medical Screening Exam ([place]) Medical Exam completed: Yes  Crisis Care Plan Living Arrangements: Alone Name of Psychiatrist: None Name of Therapist: None  Education Status Is patient currently in school?: No Highest  grade of school patient has completed: Bachelors degree in criminlogy Is the patient employed, unemployed or receiving disability?: Employed  Risk to self with the past 6 months Suicidal Ideation: Yes-Currently Present Has patient been a risk to self within the past 6 months  prior to admission? : Yes Suicidal Intent: Yes-Currently Present Has patient had any suicidal intent within the past 6 months prior to admission? : Yes Is patient at risk for suicide?: Yes Suicidal Plan?: Yes-Currently Present Specify Current Suicidal Plan:  (overdosed) Access to Means: Yes Triggers for Past Attempts: None known Intentional Self Injurious Behavior:  (overdosed) Family Suicide History: No Recent stressful life event(s): Conflict (Comment) Persecutory voices/beliefs?: No Depression: Yes Depression Symptoms: Feeling angry/irritable Substance abuse history and/or treatment for substance abuse?: Yes Suicide prevention information given to non-admitted patients: Not applicable  Risk to Others within the past 6 months Homicidal Ideation: Yes-Currently Present Does patient have any lifetime risk of violence toward others beyond the six months prior to admission? : No  Psychosis Hallucinations: None noted Delusions: None noted  Mental Status Report Appearance/Hygiene: In scrubs Eye Contact: Good Motor Activity: Agitation Speech: Aggressive, Logical/coherent, Argumentative, Abusive Level of Consciousness: Alert, Irritable Mood: Irritable, Anxious, Angry Affect: Appropriate to circumstance Anxiety Level: None Thought Processes: Coherent, Relevant Judgement: Unimpaired Orientation: Person, Place, Time, Situation, Appropriate for developmental age Obsessive Compulsive Thoughts/Behaviors: None  Cognitive Functioning Concentration: Normal Memory: Recent Intact, Remote Intact Is patient IDD: No Insight: Poor Impulse Control: Poor Appetite: Fair Have you had any weight changes? : No Change Sleep: Decreased Vegetative Symptoms: None  ADLScreening [name] Assessment Services) Patient's cognitive ability adequate to safely complete daily activities?: Yes Patient able to express need for assistance with ADLs?: Yes Independently performs ADLs?: Yes (appropriate for  developmental age)  Prior Inpatient Therapy Prior Inpatient Therapy: Yes Prior Therapy Dates: 01/2020, 12/2019 Prior Therapy Facilty/Provider(s): [facility] Reason for Treatment: Major Depression,  Personality Disorder  Prior Outpatient Therapy Prior Outpatient Therapy: No Does patient have an ACCT team?: No Does patient have Intensive In-House Services?  : No Does patient have [facility] services? : No Does patient have P4CC services?: No  ADL Screening (condition at time of admission) Patient's cognitive ability adequate to safely complete daily activities?: Yes Is the patient deaf or have difficulty hearing?: No Does the patient have difficulty seeing, even when wearing glasses/contacts?: Yes Does the patient have difficulty concentrating, remembering, or making decisions?: No Patient able to express need for assistance with ADLs?: Yes Does the patient have difficulty dressing or bathing?: No Independently performs ADLs?: Yes (appropriate for developmental age) Does the patient have difficulty walking or climbing stairs?: No Weakness of Legs: None Weakness of Arms/Hands: None  Home Assistive Devices/Equipment Home Assistive Devices/Equipment: None  Therapy Consults (therapy consults require a physician order) PT Evaluation Needed: No OT Evalulation Needed: No SLP Evaluation Needed: No Abuse/Neglect Assessment (Assessment to be complete while patient is alone) Abuse/Neglect Assessment Can Be Completed: Yes Physical Abuse: Denies Verbal Abuse: Denies Sexual Abuse: Denies Exploitation of patient/patient's resources: Denies Self-Neglect: Denies Values / Beliefs Cultural Requests During Hospitalization: None Spiritual Requests During Hospitalization: None Consults Spiritual Care Consult Needed: No Transition of Care Team Consult Needed: No Advance Directives (For Healthcare) Does Patient Have a Medical Advance Directive?: No Would patient like information on creating a  medical advance directive?: No - Patient declined          Disposition:  Disposition Initial Assessment Completed for this Encounter: Yes Patient referred to: Other (Comment)  On Site  Evaluation by:   Reviewed with Physician:    Jane Canary, MS.,LCMHC,NCC 02/15/2020 1:43 AM

## 2020-02-15 NOTE — ED Provider Notes (Signed)
-----------------------------------------   11:50 PM on 02/14/2020 -----------------------------------------  Assumed care of patient who is chatting with the sitter.  Awake, alert and in no acute distress.  We will continue to monitor in the ED.  Remains under IVC at this time.   ----------------------------------------- 3:00 AM on 02/15/2020 -----------------------------------------  Repeat acetaminophen and salicylate levels remain unremarkable.  Patient has been observed in the ED for greater than 5 hours.  He is awake and still chatty.  The patient has been placed in psychiatric observation due to the need to provide a safe environment for the patient while obtaining psychiatric consultation and evaluation, as well as ongoing medical and medication management to treat the patient's condition.  The patient has been placed under full IVC at this time.    Paulette Blanch, MD 02/15/20 [phone]

## 2020-02-15 NOTE — BH Assessment (Signed)
Referral information for Psychiatric Hospitalization faxed to;   . [facility] ([phone]-or- [phone]),   . [facility] [phone] -or- [phone]) [phone]  . [facility] ([phone]---[phone]---[phone]),  . [facility] ([phone], [phone], [phone] or [phone]),   . [facility] ([phone] or [phone])  . [facility] ([phone]),   . [facility] ([phone] -or- [phone]),   . [facility] ([phone] or [phone])  . [facility] ([phone] or [phone]),   . [facility] ([phone]). 

## 2020-02-15 NOTE — ED Notes (Signed)
Patient is IVC pending placement 

## 2020-02-15 NOTE — ED Notes (Signed)
Hourly rounding reveals patient asleep in room. No complaints, stable, in no acute distress. Q15 minute rounds and monitoring via Rover and Officer to continue.  

## 2020-02-15 NOTE — BH Assessment (Signed)
Writer spoke with the patient to complete an updated/reassessment. Patient continues to endorse SI. States he ingested medications, prior to coming to the ER, to end his life.

## 2020-02-15 NOTE — ED Notes (Signed)
Pt asleep. Sitter at bedside.

## 2020-02-15 NOTE — Consult Note (Signed)
[facility] Face-to-Face Psychiatry Consult   Reason for Consult: suicidal attempt   Referring Physician:  EDP Patient Identification: Samuel Molina MRN:  [id number] Principal Diagnosis: Intentional overdose Diagnosis:  Active Problems:   Severe recurrent major depression without psychotic features ([place])   Total Time spent with patient: 45 minutes  Subjective:   Samuel Molina is a 59 y.o. male patient admitted with Intentional overdose.  HPI:  Patient seen and evaluated by this provider. Patient reports wanting to end his life with the "pills," allegedly took an overdose of pills prior to admission.  Patient reports currently having suicidal ideations, however denies homicidal ideations and auditory/visual hallucinations. Patient offered resources for cocaine use and accepted help. Patient was irritable during the interview, not answering questions or looking at provider for the most part.   Per EDP: Samuel Molina is a 59 y.o. male history of anxiety depression substance abuse and GERD and personality disorder who is brought to the ED tonight due to reported suicide attempt in which she took about 20 unknown pills from a friend's medicine cabinet.  Patient states that he was feeling hopeless and did not want to live anymore.  He does not know what the pills were but thinks they were beige ovals.   Past Psychiatric History: See Below.  Risk to Self: Suicidal Ideation: Yes-Currently Present Suicidal Intent: Yes-Currently Present Is patient at risk for suicide?: Yes Suicidal Plan?: Yes-Currently Present Specify Current Suicidal Plan:  (overdosed) Access to Means: Yes Triggers for Past Attempts: None known Intentional Self Injurious Behavior:  (overdosed) Risk to Others: Homicidal Ideation: Yes-Currently Present Prior Inpatient Therapy: Prior Inpatient Therapy: Yes Prior Therapy Dates: 01/2020, 12/2019 Prior Therapy Facilty/Provider(s): [facility] Reason for Treatment: Major  Depression,  Personality Disorder Prior Outpatient Therapy: Prior Outpatient Therapy: No Does patient have an ACCT team?: No Does patient have Intensive In-House Services?  : No Does patient have [facility] services? : No Does patient have P4CC services?: No  Past Medical History:  Past Medical History:  Diagnosis Date  . Anxiety   . Depression   . GERD (gastroesophageal reflux disease)   . Obesity   . Substance abuse [facility])     Past Surgical History:  Procedure Laterality Date  . ESOPHAGOGASTRODUODENOSCOPY (EGD) WITH PROPOFOL N/A 09/20/2017   Procedure: ESOPHAGOGASTRODUODENOSCOPY (EGD) WITH PROPOFOL;  Surgeon: Lin Landsman, MD;  Location: [place];  Service: Gastroenterology;  Laterality: N/A;  . KNEE SURGERY Right    Family History: History reviewed. No pertinent family history. Family Psychiatric  History: None. Social History:  Social History   Substance and Sexual Activity  Alcohol Use Not Currently     Social History   Substance and Sexual Activity  Drug Use Yes  . Types: Cocaine    Social History   Socioeconomic History  . Marital status: Single    Spouse name: Not on file  . Number of children: Not on file  . Years of education: Not on file  . Highest education level: Not on file  Occupational History  . Not on file  Tobacco Use  . Smoking status: Current Every Day Smoker    Packs/day: 0.50    Types: Cigarettes  . Smokeless tobacco: Never Used  Vaping Use  . Vaping Use: Never used  Substance and Sexual Activity  . Alcohol use: Not Currently  . Drug use: Yes    Types: Cocaine  . Sexual activity: Not on file  Other Topics Concern  . Not on file  Social History Narrative  .  Not on file   Social Determinants of Health   Financial Resource Strain:   . Difficulty of Paying Living Expenses: Not on file  Food Insecurity:   . Worried About Charity fundraiser in the Last Year: Not on file  . Ran Out of Food in the Last Year: Not on file   Transportation Needs:   . Lack of Transportation (Medical): Not on file  . Lack of Transportation (Non-Medical): Not on file  Physical Activity:   . Days of Exercise per Week: Not on file  . Minutes of Exercise per Session: Not on file  Stress:   . Feeling of Stress : Not on file  Social Connections:   . Frequency of Communication with Friends and Family: Not on file  . Frequency of Social Gatherings with Friends and Family: Not on file  . Attends Religious Services: Not on file  . Active Member of Clubs or Organizations: Not on file  . Attends Archivist Meetings: Not on file  . Marital Status: Not on file   Additional Social History:    Allergies:   Allergies  Allergen Reactions  . Acetaminophen Itching, Nausea And Vomiting and Rash    Labs:  Results for orders placed or performed during the hospital encounter of 02/14/20 (from the past 48 hour(s))  Acetaminophen level     Status: Abnormal   Collection Time: 02/14/20 10:21 PM  Result Value Ref Range   Acetaminophen (Tylenol), Serum <10 (L) 10 - 30 ug/mL    Comment: (NOTE) Therapeutic concentrations vary significantly. A range of 10-30 ug/mL  may be an effective concentration for many patients. However, some  are best treated at concentrations outside of this range. Acetaminophen concentrations >150 ug/mL at 4 hours after ingestion  and >50 ug/mL at 12 hours after ingestion are often associated with  toxic reactions.  Performed at [facility], [address] [place], [place] [address]   CBC with Differential     Status: None   Collection Time: 02/14/20 10:21 PM  Result Value Ref Range   WBC 8.5 4.0 - 10.5 K/uL   RBC 5.35 4.22 - 5.81 MIL/uL   Hemoglobin 13.9 13.0 - 17.0 g/dL   HCT 42.9 39 - 52 %   MCV 80.2 80.0 - 100.0 fL   MCH 26.0 26.0 - 34.0 pg   MCHC 32.4 30.0 - 36.0 g/dL   RDW 14.5 11.5 - 15.5 %   Platelets 331 150 - 400 K/uL   nRBC 0.0 0.0 - 0.2 %   Neutrophils Relative % 61 %    Neutro Abs 5.2 1.7 - 7.7 K/uL   Lymphocytes Relative 26 %   Lymphs Abs 2.2 0.7 - 4.0 K/uL   Monocytes Relative 10 %   Monocytes Absolute 0.9 0.1 - 1.0 K/uL   Eosinophils Relative 2 %   Eosinophils Absolute 0.1 0.0 - 0.5 K/uL   Basophils Relative 1 %   Basophils Absolute 0.0 0.0 - 0.1 K/uL   Immature Granulocytes 0 %   Abs Immature Granulocytes 0.03 0.00 - 0.07 K/uL    Comment: Performed at [facility], [address], [place], [place] [address]  Comprehensive metabolic panel     Status: Abnormal   Collection Time: 02/14/20 10:21 PM  Result Value Ref Range   Sodium 141 135 - 145 mmol/L   Potassium 3.4 (L) 3.5 - 5.1 mmol/L   Chloride 107 98 - 111 mmol/L   CO2 27 22 - 32 mmol/L  Glucose, Bld 105 (H) 70 - 99 mg/dL    Comment: Glucose reference range applies only to samples taken after fasting for at least 8 hours.   BUN 17 6 - 20 mg/dL   Creatinine, Ser 0.83 0.61 - 1.24 mg/dL   Calcium 9.0 8.9 - 10.3 mg/dL   Total Protein 7.2 6.5 - 8.1 g/dL   Albumin 3.8 3.5 - 5.0 g/dL   AST 19 15 - 41 U/L   ALT 14 0 - 44 U/L   Alkaline Phosphatase 80 38 - 126 U/L   Total Bilirubin 0.5 0.3 - 1.2 mg/dL   GFR, Estimated >60 >60 mL/min    Comment: (NOTE) Calculated using the CKD-EPI Creatinine Equation (2021)    Anion gap 7 5 - 15    Comment: Performed at [facility], [address] [place], [place] [address]  Ethanol     Status: None   Collection Time: 02/14/20 10:21 PM  Result Value Ref Range   Alcohol, Ethyl (B) <10 <10 mg/dL    Comment: (NOTE) Lowest detectable limit for serum alcohol is 10 mg/dL.  For medical purposes only. Performed at [facility], [address] [place], [place] [address]   Lipase, blood     Status: None   Collection Time: 02/14/20 10:21 PM  Result Value Ref Range   Lipase 37 11 - 51 U/L    Comment: Performed at [facility], [address] [place], [place] [address]  Salicylate level     Status: Abnormal   Collection  Time: 02/14/20 10:21 PM  Result Value Ref Range   Salicylate Lvl <9.9 (L) 7.0 - 30.0 mg/dL    Comment: Performed at [facility], [address] [place], [place] [address]  Urine Drug Screen, Qualitative     Status: Abnormal   Collection Time: 02/15/20  2:14 AM  Result Value Ref Range   Tricyclic, Ur Screen NONE DETECTED NONE DETECTED   Amphetamines, Ur Screen NONE DETECTED NONE DETECTED   MDMA (Ecstasy)Ur Screen NONE DETECTED NONE DETECTED   Cocaine Metabolite,Ur [place] POSITIVE (A) NONE DETECTED   Opiate, Ur Screen NONE DETECTED NONE DETECTED   Phencyclidine (PCP) Ur S NONE DETECTED NONE DETECTED   Cannabinoid 50 Ng, Ur [place] NONE DETECTED NONE DETECTED   Barbiturates, Ur Screen NONE DETECTED NONE DETECTED   Benzodiazepine, Ur Scrn NONE DETECTED NONE DETECTED   Methadone Scn, Ur NONE DETECTED NONE DETECTED    Comment: (NOTE) Tricyclics + metabolites, urine    Cutoff 1000 ng/mL Amphetamines + metabolites, urine  Cutoff 1000 ng/mL MDMA (Ecstasy), urine              Cutoff 500 ng/mL Cocaine Metabolite, urine          Cutoff 300 ng/mL Opiate + metabolites, urine        Cutoff 300 ng/mL Phencyclidine (PCP), urine         Cutoff 25 ng/mL Cannabinoid, urine                 Cutoff 50 ng/mL Barbiturates + metabolites, urine  Cutoff 200 ng/mL Benzodiazepine, urine              Cutoff 200 ng/mL Methadone, urine                   Cutoff 300 ng/mL  The urine drug screen provides only a preliminary, unconfirmed analytical test result and should not be used for non-medical purposes. Clinical consideration and professional judgment should be  applied to any positive drug screen result due to possible interfering substances. A more specific alternate chemical method must be used in order to obtain a confirmed analytical result. Gas chromatography / mass spectrometry (GC/MS) is the preferred confirm atory method. Performed at [facility], [address] [place], [place]  [address]   Acetaminophen level     Status: Abnormal   Collection Time: 02/15/20  2:14 AM  Result Value Ref Range   Acetaminophen (Tylenol), Serum <10 (L) 10 - 30 ug/mL    Comment: (NOTE) Therapeutic concentrations vary significantly. A range of 10-30 ug/mL  may be an effective concentration for many patients. However, some  are best treated at concentrations outside of this range. Acetaminophen concentrations >150 ug/mL at 4 hours after ingestion  and >50 ug/mL at 12 hours after ingestion are often associated with  toxic reactions.  Performed at [facility], [address] [place], [place] [address]   Salicylate level     Status: Abnormal   Collection Time: 02/15/20  2:14 AM  Result Value Ref Range   Salicylate Lvl <2.7 (L) 7.0 - 30.0 mg/dL    Comment: Performed at [facility], [address] [place], [place] [address]  Respiratory Panel by RT PCR (Flu A&B, Covid) - Nasopharyngeal Swab     Status: None   Collection Time: 02/15/20  6:34 AM   Specimen: Nasopharyngeal Swab  Result Value Ref Range   SARS Coronavirus 2 by RT PCR NEGATIVE NEGATIVE    Comment: (NOTE) SARS-CoV-2 target nucleic acids are NOT DETECTED.  The SARS-CoV-2 RNA is generally detectable in upper respiratoy specimens during the acute phase of infection. The lowest concentration of SARS-CoV-2 viral copies this assay can detect is 131 copies/mL. A negative result does not preclude SARS-Cov-2 infection and should not be used as the sole basis for treatment or other patient management decisions. A negative result may occur with  improper specimen collection/handling, submission of specimen other than nasopharyngeal swab, presence of viral mutation(s) within the areas targeted by this assay, and inadequate number of viral copies (<131 copies/mL). A negative result must be combined with clinical observations, patient history, and epidemiological information. The expected result is  Negative.  Fact Sheet for Patients:  PinkCheek.be  Fact Sheet for Healthcare Providers:  GravelBags.it  This test is no t yet approved or cleared by the [place] FDA and  has been authorized for detection and/or diagnosis of SARS-CoV-2 by FDA under an Emergency Use Authorization (EUA). This EUA will remain  in effect (meaning this test can be used) for the duration of the COVID-19 declaration under Section 564(b)(1) of the Act, 21 U.S.C. section 360bbb-3(b)(1), unless the authorization is terminated or revoked sooner.     Influenza A by PCR NEGATIVE NEGATIVE   Influenza B by PCR NEGATIVE NEGATIVE    Comment: (NOTE) The Xpert Xpress SARS-CoV-2/FLU/RSV assay is intended as an aid in  the diagnosis of influenza from Nasopharyngeal swab specimens and  should not be used as a sole basis for treatment. Nasal washings and  aspirates are unacceptable for Xpert Xpress SARS-CoV-2/FLU/RSV  testing.  Fact Sheet for Patients: PinkCheek.be  Fact Sheet for Healthcare Providers: GravelBags.it  This test is not yet approved or cleared by the [place] FDA and  has been authorized for detection and/or diagnosis of SARS-CoV-2 by  FDA under an Emergency Use Authorization (EUA). This EUA will remain  in effect (meaning this test can be used) for the duration of the  Covid-19 declaration under Section 564(b)(1) of the Act, 21  U.S.C. section 360bbb-3(b)(1), unless the authorization is  terminated or revoked. Performed at [facility], [address], [place], [place] [address]     Current Facility-Administered Medications  Medication Dose Route Frequency Provider Last Rate Last Admin  . alum & mag hydroxide-simeth (MAALOX/MYLANTA) 200-200-20 MG/5ML suspension 30 mL  30 mL Oral Q6H PRN Carrie Mew, MD      . ondansetron [facility]) tablet 4 mg  4 mg Oral Q8H  PRN Carrie Mew, MD   4 mg at 02/14/20 2352   Current Outpatient Medications  Medication Sig Dispense Refill  . citalopram (CELEXA) 10 MG tablet Take 1 tablet (10 mg total) by mouth daily. 30 tablet 1  . gabapentin (NEURONTIN) 300 MG capsule Take 1 capsule (300 mg total) by mouth 3 (three) times daily. 90 capsule 1  . mirtazapine (REMERON) 30 MG tablet Take 1 tablet (30 mg total) by mouth at bedtime. 30 tablet 1  . naproxen (NAPROSYN) 500 MG tablet Take 1 tablet (500 mg total) by mouth 2 (two) times daily with a meal. 60 tablet 0  . pantoprazole (PROTONIX) 40 MG tablet Take 2 tablets (80 mg total) by mouth daily. 60 tablet 1    Musculoskeletal: Strength & Muscle Tone: within normal limits Gait & Station: Not assessed Patient leans: N/A  Psychiatric Specialty Exam: Physical Exam Vitals and nursing note reviewed.  Constitutional:      Appearance: Normal appearance.  HENT:     Head: Normocephalic.     Nose: Nose normal.  Musculoskeletal:        General: Normal range of motion.     Cervical back: Normal range of motion.  Neurological:     Mental Status: He is alert.  Psychiatric:        Attention and Perception: He does not perceive auditory or visual hallucinations.        Mood and Affect: Mood is depressed.        Speech: Speech normal.        Behavior: Behavior is uncooperative.        Thought Content: Thought content includes suicidal ideation. Thought content does not include homicidal ideation. Thought content includes suicidal plan. Thought content does not include homicidal plan.        Cognition and Memory: Cognition normal.     Review of Systems  Psychiatric/Behavioral: Positive for agitation, dysphoric mood and suicidal ideas.  All other systems reviewed and are negative.   Blood pressure (!) 139/91, pulse (!) 53, temperature 97.7 F (36.5 C), temperature source Oral, resp. rate 18, height 6\' 1"  (1.854 m), weight 98 kg, SpO2 100 %.Body mass index is 28.5 kg/m.   General Appearance: Casual  Eye Contact:  Poor  Speech:  Normal Rate  Volume:  Decreased  Mood:  Irritable  Affect:  Congruent  Thought Process:  Coherent  Orientation:  Full (Time, Place, and Person)  Thought Content:  Logical  Suicidal Thoughts:  Yes.  with intent/plan  Homicidal Thoughts:  No  Memory:  Immediate;   Good Recent;   Good Remote;   Good  Judgement:  Poor  Insight:  Lacking  Psychomotor Activity:  Normal  Concentration:  Concentration: Good and Attention Span: Good  Recall:  Good  Fund of Knowledge:  Fair  Language:  Good  Akathisia:  NA  Handed:  Right  AIMS (if indicated):     Assets:  Desire for Improvement  ADL's:  Intact  Cognition:  WNL  Sleep:        Treatment Plan Summary: Daily contact with patient to assess and evaluate symptoms and progress in treatment, Medication management and Plan Major depressive disorder, recurrent, severe without psychosis    Major Depressive Disorder, Recurrent, Severe without psychosis -Continue Celexa 10 mg daily  Anxiety: -Continue Gabapentin 300 mg TID  Insomnia: -Continue Mirtazapine 30 mg QHS  Disposition: Recommend psychiatric Inpatient admission when medically cleared.  Waylan Boga, NP 02/15/2020 1:59 PM

## 2020-02-16 ENCOUNTER — Other Ambulatory Visit: Payer: Self-pay

## 2020-02-16 ENCOUNTER — Encounter: Payer: Self-pay | Admitting: Psychiatry

## 2020-02-16 ENCOUNTER — Inpatient Hospital Stay
Admission: RE | Admit: 2020-02-16 | Discharge: 2020-02-24 | DRG: 885 | Disposition: A | Discharge status: Home / Self Care | Payer: No Typology Code available for payment source | Source: Intra-hospital | Attending: Behavioral Health | Admitting: Behavioral Health

## 2020-02-16 DIAGNOSIS — M25552 Pain in left hip: Secondary | ICD-10-CM | POA: Diagnosis present

## 2020-02-16 DIAGNOSIS — F1410 Cocaine abuse, uncomplicated: Secondary | ICD-10-CM | POA: Diagnosis present

## 2020-02-16 DIAGNOSIS — F332 Major depressive disorder, recurrent severe without psychotic features: Principal | ICD-10-CM | POA: Diagnosis present

## 2020-02-16 DIAGNOSIS — M1710 Unilateral primary osteoarthritis, unspecified knee: Secondary | ICD-10-CM | POA: Diagnosis present

## 2020-02-16 DIAGNOSIS — Z9151 Personal history of suicidal behavior: Secondary | ICD-10-CM

## 2020-02-16 DIAGNOSIS — G4700 Insomnia, unspecified: Secondary | ICD-10-CM | POA: Diagnosis present

## 2020-02-16 DIAGNOSIS — L732 Hidradenitis suppurativa: Secondary | ICD-10-CM | POA: Diagnosis present

## 2020-02-16 DIAGNOSIS — F609 Personality disorder, unspecified: Secondary | ICD-10-CM | POA: Diagnosis present

## 2020-02-16 DIAGNOSIS — F17210 Nicotine dependence, cigarettes, uncomplicated: Secondary | ICD-10-CM | POA: Diagnosis present

## 2020-02-16 DIAGNOSIS — Z886 Allergy status to analgesic agent status: Secondary | ICD-10-CM

## 2020-02-16 DIAGNOSIS — F419 Anxiety disorder, unspecified: Secondary | ICD-10-CM | POA: Diagnosis present

## 2020-02-16 DIAGNOSIS — G629 Polyneuropathy, unspecified: Secondary | ICD-10-CM | POA: Diagnosis present

## 2020-02-16 DIAGNOSIS — K2100 Gastro-esophageal reflux disease with esophagitis, without bleeding: Secondary | ICD-10-CM | POA: Diagnosis present

## 2020-02-16 LAB — LIPID PANEL
Cholesterol: 171 mg/dL (ref 0–200)
HDL: 36 mg/dL — ABNORMAL LOW (ref >40)
LDL Cholesterol: 69 mg/dL (ref 0–99)
Total CHOL/HDL Ratio: 4.8 RATIO
Triglycerides: 330 mg/dL — ABNORMAL HIGH (ref <150)
VLDL: 66 mg/dL — ABNORMAL HIGH (ref 0–40)

## 2020-02-16 MED ORDER — QUETIAPINE FUMARATE 25 MG PO TABS
50.0000 mg | ORAL_TABLET | Freq: Every day | ORAL | Status: DC
  Administered 2020-02-16 – 2020-02-18 (×3): 50 mg via ORAL
  Filled 2020-02-16 (×3): qty 2

## 2020-02-16 MED ORDER — NAPROXEN 500 MG PO TABS: 500.0000 mg | ORAL_TABLET | Freq: Two times a day (BID) | ORAL | Status: DC

## 2020-02-16 MED ORDER — GABAPENTIN 400 MG PO CAPS
400.0000 mg | ORAL_CAPSULE | Freq: Three times a day (TID) | ORAL | Status: DC
  Administered 2020-02-16 – 2020-02-17 (×3): 400 mg via ORAL
  Filled 2020-02-16 (×3): qty 1

## 2020-02-16 MED ORDER — ACETAMINOPHEN 325 MG PO TABS: 650.0000 mg | ORAL_TABLET | Freq: Four times a day (QID) | ORAL | Status: DC | PRN

## 2020-02-16 MED ORDER — NAPROXEN 375 MG PO TABS
375.0000 mg | ORAL_TABLET | Freq: Two times a day (BID) | ORAL | Status: DC
  Administered 2020-02-16 – 2020-02-19 (×6): 375 mg via ORAL
  Filled 2020-02-16 (×8): qty 1

## 2020-02-16 MED ORDER — MAGNESIUM HYDROXIDE 400 MG/5ML PO SUSP: 30.0000 mL | Freq: Every day | ORAL | Status: DC | PRN

## 2020-02-16 MED ORDER — CITALOPRAM HYDROBROMIDE 20 MG PO TABS
10.0000 mg | ORAL_TABLET | Freq: Every day | ORAL | Status: DC
  Administered 2020-02-17 – 2020-02-24 (×8): 10 mg via ORAL
  Filled 2020-02-16 (×8): qty 1

## 2020-02-16 MED ORDER — PANTOPRAZOLE SODIUM 40 MG PO TBEC: 80.0000 mg | DELAYED_RELEASE_TABLET | Freq: Every day | ORAL | Status: DC

## 2020-02-16 MED ORDER — GABAPENTIN 300 MG PO CAPS: 300.0000 mg | ORAL_CAPSULE | Freq: Three times a day (TID) | ORAL | Status: DC

## 2020-02-16 MED ORDER — TRAZODONE HCL 100 MG PO TABS: 100.0000 mg | ORAL_TABLET | Freq: Every evening | ORAL | Status: DC | PRN

## 2020-02-16 MED ORDER — ALUM & MAG HYDROXIDE-SIMETH 200-200-20 MG/5ML PO SUSP
30.0000 mL | ORAL | Status: DC | PRN
  Filled 2020-02-16: qty 30

## 2020-02-16 MED ORDER — HYDROXYZINE HCL 50 MG PO TABS: 50.0000 mg | ORAL_TABLET | Freq: Three times a day (TID) | ORAL | Status: DC | PRN

## 2020-02-16 MED ORDER — MIRTAZAPINE 15 MG PO TABS: 30.0000 mg | ORAL_TABLET | Freq: Every day | ORAL | Status: DC

## 2020-02-16 MED ORDER — TRAZODONE HCL 100 MG PO TABS: 200.0000 mg | ORAL_TABLET | Freq: Every evening | ORAL | Status: DC | PRN

## 2020-02-16 MED ORDER — ONDANSETRON HCL 4 MG PO TABS
4.0000 mg | ORAL_TABLET | Freq: Three times a day (TID) | ORAL | Status: DC | PRN
  Administered 2020-02-21 – 2020-02-24 (×4): 4 mg via ORAL
  Filled 2020-02-16 (×4): qty 1

## 2020-02-16 MED ORDER — ALUM & MAG HYDROXIDE-SIMETH 200-200-20 MG/5ML PO SUSP: 30.0000 mL | Freq: Four times a day (QID) | ORAL | Status: DC | PRN

## 2020-02-16 MED ORDER — TRAZODONE HCL 100 MG PO TABS
100.0000 mg | ORAL_TABLET | Freq: Every evening | ORAL | Status: DC | PRN
  Administered 2020-02-16 – 2020-02-21 (×5): 100 mg via ORAL
  Filled 2020-02-16 (×4): qty 1

## 2020-02-16 MED ORDER — PANTOPRAZOLE SODIUM 40 MG PO TBEC
80.0000 mg | DELAYED_RELEASE_TABLET | Freq: Every day | ORAL | Status: DC
  Administered 2020-02-17 – 2020-02-24 (×8): 80 mg via ORAL
  Filled 2020-02-16 (×8): qty 2

## 2020-02-16 NOTE — ED Notes (Signed)
Hourly rounding reveals patient in room. No complaints, stable, in no acute distress. Q15 minute rounds and monitoring via Security Cameras to continue. 

## 2020-02-16 NOTE — BH Assessment (Signed)
Patient is to be admitted to [facility] by [name].  Attending Physician will be Dr. Domingo Cocking.   Patient has been assigned to room 323, by Accident.    Dr. Kerman Passey, ER MD   Amy B., Patient's Nurse   Gust Rung., Patient Access.

## 2020-02-16 NOTE — H&P (Signed)
Psychiatric Admission Assessment Adult  Patient Identification: Samuel Molina MRN:  [id number] Date of Evaluation:  02/16/2020 Chief Complaint:  Severe recurrent major depression without psychotic features ([place]) [F33.2] Principal Diagnosis: Severe recurrent major depression without psychotic features ([place]) Diagnosis:  Principal Problem:   Severe recurrent major depression without psychotic features ([place]) Active Problems:   Gastroesophageal reflux disease with esophagitis   Cocaine abuse ([place])   Arthritis of knee   Personality disorder ([place])  History of Present Illness: Patient recently discharged on Feb 03, 2020 after abruptly stating his son had come to town to take him to [place] to live. He was very excited about this plan, and was discharged on Celexa 10 mg daily, gabapentin 300 mg TID, mirtazapine 30 mg QHS, protonix 80 mg daily, and naproxen PRN. He states he did go to [place] with his son, but left soon after due to fear of ruining his son and daughter-in-laws marriage. He came back last week.  He returned to the emergency department on 02/12/20 reporting homicidal thoughts toward his cousin that resolved when he was unable to get a ED room. He left and returned several hours later with suicidal thoughts. He did not have a specific suicidal plan at that time, and was discharged again. He returned on Oct 23 stating he attempted suicide by taking 20 unknown tablets from a friend. He states he took them in a [employer] parking lot, and a boy that worked there called the police. He said he did feel somewhat better when he know someone cared enough to call 911 and wait with him. CBC, and CMP unremarkable. ASA and tylenol levels negative. Vitals stable.  UDS positive for cocaine.   On interview patient's mood is somewhat labile. He is intermittently tearful, and then becomes angry on several occasions when he recounts his story. He feels that Remeron was not helpful for sleep or mood, and wishes to  discontinue this medicine. RBA of Seroquel discussed for mood, insomnia, and impulsivity and he is agreeable to trying this medicine. Also agreeable to increase in gabapentin and physical therapy consult for neuropathic pain.    Associated Signs/Symptoms: Depression Symptoms:  depressed mood, anhedonia, feelings of worthlessness/guilt, hopelessness, recurrent thoughts of death, suicidal attempt, Duration of Depression Symptoms: No data recorded (Hypo) Manic Symptoms:  Impulsivity, Irritable Mood, Anxiety Symptoms:  Excessive Worry, Psychotic Symptoms:  Paranoia, Duration of Psychotic Symptoms: No data recorded PTSD Symptoms: Negative Total Time spent with patient: 1 hour  Past Psychiatric History: Past history of at least 3 prior inpatient treatments here back to back.  On admissions he was not really cooperative with much treatment in the hospital but demanded referral to inpatient rehab then left the hospital early when things did not happen quickly enough.  No known prior suicide attempts prior to this most recent attempt.   Is the patient at risk to self? Yes.    Has the patient been a risk to self in the past 6 months? Yes.    Has the patient been a risk to self within the distant past? Yes.    Is the patient a risk to others? No.  Has the patient been a risk to others in the past 6 months? No.  Has the patient been a risk to others within the distant past? No.    Alcohol Screening: 1. How often do you have a drink containing alcohol?: Monthly or less 2. How many drinks containing alcohol do you have on a typical day when you are  drinking?: 1 or 2 3. How often do you have six or more drinks on one occasion?: Never AUDIT-C Score: 1 4. How often during the last year have you found that you were not able to stop drinking once you had started?: Never 5. How often during the last year have you failed to do what was normally expected from you because of drinking?: Never 6. How  often during the last year have you needed a first drink in the morning to get yourself going after a heavy drinking session?: Never 7. How often during the last year have you had a feeling of guilt of remorse after drinking?: Never 8. How often during the last year have you been unable to remember what happened the night before because you had been drinking?: Never 9. Have you or someone else been injured as a result of your drinking?: No 10. Has a relative or friend or a doctor or another health worker been concerned about your drinking or suggested you cut down?: No Alcohol Use Disorder Identification Test Final Score (AUDIT): 1 Alcohol Brief Interventions/Follow-up: AUDIT Score <7 follow-up not indicated Substance Abuse History in the last 12 months:  Yes.   Consequences of Substance Abuse: Withdrawal Symptoms:   Headaches Previous Psychotropic Medications: Yes  Psychological Evaluations: Yes  Past Medical History:  Past Medical History:  Diagnosis Date  . Anxiety   . Depression   . GERD (gastroesophageal reflux disease)   . Obesity   . Substance abuse [facility])     Past Surgical History:  Procedure Laterality Date  . ESOPHAGOGASTRODUODENOSCOPY (EGD) WITH PROPOFOL N/A 09/20/2017   Procedure: ESOPHAGOGASTRODUODENOSCOPY (EGD) WITH PROPOFOL;  Surgeon: Lin Landsman, MD;  Location: [facility];  Service: Gastroenterology;  Laterality: N/A;  . KNEE SURGERY Right    Family History: History reviewed. No pertinent family history. Family Psychiatric  History: Denies Tobacco Screening: Have you used any form of tobacco in the last 30 days? (Cigarettes, Smokeless Tobacco, Cigars, and/or Pipes): Yes Tobacco use, Select all that apply: 5 or more cigarettes per day Are you interested in Tobacco Cessation Medications?: No, patient refused Counseled patient on smoking cessation including recognizing danger situations, developing coping skills and basic information about quitting provided:  Yes Social History:  Social History   Substance and Sexual Activity  Alcohol Use Not Currently     Social History   Substance and Sexual Activity  Drug Use Yes  . Types: Cocaine    Additional Social History:     Allergies:   Allergies  Allergen Reactions  . Acetaminophen Itching, Nausea And Vomiting and Rash   Lab Results:  Results for orders placed or performed during the hospital encounter of 02/14/20 (from the past 48 hour(s))  Acetaminophen level     Status: Abnormal   Collection Time: 02/14/20 10:21 PM  Result Value Ref Range   Acetaminophen (Tylenol), Serum <10 (L) 10 - 30 ug/mL    Comment: (NOTE) Therapeutic concentrations vary significantly. A range of 10-30 ug/mL  may be an effective concentration for many patients. However, some  are best treated at concentrations outside of this range. Acetaminophen concentrations >150 ug/mL at 4 hours after ingestion  and >50 ug/mL at 12 hours after ingestion are often associated with  toxic reactions.  Performed at [facility], [address] [place], [place] [address]   CBC with Differential     Status: None   Collection Time: 02/14/20 10:21 PM  Result Value Ref Range   WBC 8.5 4.0 -  10.5 K/uL   RBC 5.35 4.22 - 5.81 MIL/uL   Hemoglobin 13.9 13.0 - 17.0 g/dL   HCT 42.9 39 - 52 %   MCV 80.2 80.0 - 100.0 fL   MCH 26.0 26.0 - 34.0 pg   MCHC 32.4 30.0 - 36.0 g/dL   RDW 14.5 11.5 - 15.5 %   Platelets 331 150 - 400 K/uL   nRBC 0.0 0.0 - 0.2 %   Neutrophils Relative % 61 %   Neutro Abs 5.2 1.7 - 7.7 K/uL   Lymphocytes Relative 26 %   Lymphs Abs 2.2 0.7 - 4.0 K/uL   Monocytes Relative 10 %   Monocytes Absolute 0.9 0.1 - 1.0 K/uL   Eosinophils Relative 2 %   Eosinophils Absolute 0.1 0.0 - 0.5 K/uL   Basophils Relative 1 %   Basophils Absolute 0.0 0.0 - 0.1 K/uL   Immature Granulocytes 0 %   Abs Immature Granulocytes 0.03 0.00 - 0.07 K/uL    Comment: Performed at [facility], [address] [place], [place] [address]  Comprehensive metabolic panel     Status: Abnormal   Collection Time: 02/14/20 10:21 PM  Result Value Ref Range   Sodium 141 135 - 145 mmol/L   Potassium 3.4 (L) 3.5 - 5.1 mmol/L   Chloride 107 98 - 111 mmol/L   CO2 27 22 - 32 mmol/L   Glucose, Bld 105 (H) 70 - 99 mg/dL    Comment: Glucose reference range applies only to samples taken after fasting for at least 8 hours.   BUN 17 6 - 20 mg/dL   Creatinine, Ser 0.83 0.61 - 1.24 mg/dL   Calcium 9.0 8.9 - 10.3 mg/dL   Total Protein 7.2 6.5 - 8.1 g/dL   Albumin 3.8 3.5 - 5.0 g/dL   AST 19 15 - 41 U/L   ALT 14 0 - 44 U/L   Alkaline Phosphatase 80 38 - 126 U/L   Total Bilirubin 0.5 0.3 - 1.2 mg/dL   GFR, Estimated >60 >60 mL/min    Comment: (NOTE) Calculated using the CKD-EPI Creatinine Equation (2021)    Anion gap 7 5 - 15    Comment: Performed at [facility], [address] [place], [place] [address]  Ethanol     Status: None   Collection Time: 02/14/20 10:21 PM  Result Value Ref Range   Alcohol, Ethyl (B) <10 <10 mg/dL    Comment: (NOTE) Lowest detectable limit for serum alcohol is 10 mg/dL.  For medical purposes only. Performed at [facility], [address] [place], [place] [address]   Lipase, blood     Status: None   Collection Time: 02/14/20 10:21 PM  Result Value Ref Range   Lipase 37 11 - 51 U/L    Comment: Performed at [facility], [address] [place], [place] [address]  Salicylate level     Status: Abnormal   Collection Time: 02/14/20 10:21 PM  Result Value Ref Range   Salicylate Lvl <9.0 (L) 7.0 - 30.0 mg/dL    Comment: Performed at [facility], [address] [place],  [address]  Urine Drug Screen, Qualitative     Status: Abnormal   Collection Time: 02/15/20  2:14 AM  Result Value Ref Range   Tricyclic, Ur Screen NONE DETECTED NONE DETECTED   Amphetamines, Ur Screen NONE DETECTED NONE DETECTED   MDMA (Ecstasy)Ur Screen NONE  DETECTED NONE DETECTED   Cocaine Metabolite,Ur [place] POSITIVE (A) NONE DETECTED  Opiate, Ur Screen NONE DETECTED NONE DETECTED   Phencyclidine (PCP) Ur S NONE DETECTED NONE DETECTED   Cannabinoid 50 Ng, Ur [place] NONE DETECTED NONE DETECTED   Barbiturates, Ur Screen NONE DETECTED NONE DETECTED   Benzodiazepine, Ur Scrn NONE DETECTED NONE DETECTED   Methadone Scn, Ur NONE DETECTED NONE DETECTED    Comment: (NOTE) Tricyclics + metabolites, urine    Cutoff 1000 ng/mL Amphetamines + metabolites, urine  Cutoff 1000 ng/mL MDMA (Ecstasy), urine              Cutoff 500 ng/mL Cocaine Metabolite, urine          Cutoff 300 ng/mL Opiate + metabolites, urine        Cutoff 300 ng/mL Phencyclidine (PCP), urine         Cutoff 25 ng/mL Cannabinoid, urine                 Cutoff 50 ng/mL Barbiturates + metabolites, urine  Cutoff 200 ng/mL Benzodiazepine, urine              Cutoff 200 ng/mL Methadone, urine                   Cutoff 300 ng/mL  The urine drug screen provides only a preliminary, unconfirmed analytical test result and should not be used for non-medical purposes. Clinical consideration and professional judgment should be applied to any positive drug screen result due to possible interfering substances. A more specific alternate chemical method must be used in order to obtain a confirmed analytical result. Gas chromatography / mass spectrometry (GC/MS) is the preferred confirm atory method. Performed at [facility], [address] [place], [place] [address]   Acetaminophen level     Status: Abnormal   Collection Time: 02/15/20  2:14 AM  Result Value Ref Range   Acetaminophen (Tylenol), Serum <10 (L) 10 - 30 ug/mL    Comment: (NOTE) Therapeutic concentrations vary significantly. A range of 10-30 ug/mL  may be an effective concentration for many patients. However, some  are best treated at concentrations outside of this range. Acetaminophen concentrations >150 ug/mL at 4 hours after  ingestion  and >50 ug/mL at 12 hours after ingestion are often associated with  toxic reactions.  Performed at [facility] [address] [place], [place] [address]   Salicylate level     Status: Abnormal   Collection Time: 02/15/20  2:14 AM  Result Value Ref Range   Salicylate Lvl <1.9 (L) 7.0 - 30.0 mg/dL    Comment: Performed at [facility], [address] [place], [place] [address]  Respiratory Panel by RT PCR (Flu A&B, Covid) - Nasopharyngeal Swab     Status: None   Collection Time: 02/15/20  6:34 AM   Specimen: Nasopharyngeal Swab  Result Value Ref Range   SARS Coronavirus 2 by RT PCR NEGATIVE NEGATIVE    Comment: (NOTE) SARS-CoV-2 target nucleic acids are NOT DETECTED.  The SARS-CoV-2 RNA is generally detectable in upper respiratoy specimens during the acute phase of infection. The lowest concentration of SARS-CoV-2 viral copies this assay can detect is 131 copies/mL. A negative result does not preclude SARS-Cov-2 infection and should not be used as the sole basis for treatment or other patient management decisions. A negative result may occur with  improper specimen collection/handling, submission of specimen other than nasopharyngeal swab, presence of viral mutation(s) within the areas targeted by this assay, and inadequate number of viral copies (<131 copies/mL). A negative result  must be combined with clinical observations, patient history, and epidemiological information. The expected result is Negative.  Fact Sheet for Patients:  PinkCheek.be  Fact Sheet for Healthcare Providers:  GravelBags.it  This test is no t yet approved or cleared by the [place] FDA and  has been authorized for detection and/or diagnosis of SARS-CoV-2 by FDA under an Emergency Use Authorization (EUA). This EUA will remain  in effect (meaning this test can be used) for the duration of the COVID-19  declaration under Section 564(b)(1) of the Act, 21 U.S.C. section 360bbb-3(b)(1), unless the authorization is terminated or revoked sooner.     Influenza A by PCR NEGATIVE NEGATIVE   Influenza B by PCR NEGATIVE NEGATIVE    Comment: (NOTE) The Xpert Xpress SARS-CoV-2/FLU/RSV assay is intended as an aid in  the diagnosis of influenza from Nasopharyngeal swab specimens and  should not be used as a sole basis for treatment. Nasal washings and  aspirates are unacceptable for Xpert Xpress SARS-CoV-2/FLU/RSV  testing.  Fact Sheet for Patients: PinkCheek.be  Fact Sheet for Healthcare Providers: GravelBags.it  This test is not yet approved or cleared by the [place] FDA and  has been authorized for detection and/or diagnosis of SARS-CoV-2 by  FDA under an Emergency Use Authorization (EUA). This EUA will remain  in effect (meaning this test can be used) for the duration of the  Covid-19 declaration under Section 564(b)(1) of the Act, 21  U.S.C. section 360bbb-3(b)(1), unless the authorization is  terminated or revoked. Performed at [facility], [address] [place], [place] [address]     Blood Alcohol level:  Lab Results  Component Value Date   [facility] <10 02/14/2020   ETH <10 77/82/4235    Metabolic Disorder Labs:  No results found for: HGBA1C, MPG No results found for: PROLACTIN No results found for: CHOL, TRIG, HDL, CHOLHDL, VLDL, LDLCALC  Current Medications: Current Facility-Administered Medications  Medication Dose Route Frequency Provider Last Rate Last Admin  . alum & mag hydroxide-simeth (MAALOX/MYLANTA) 200-200-20 MG/5ML suspension 30 mL  30 mL Oral Q4H PRN Clapacs, John T, MD      . alum & mag hydroxide-simeth (MAALOX/MYLANTA) 200-200-20 MG/5ML suspension 30 mL  30 mL Oral Q6H PRN Clapacs, [name], MD      . Derrill Memo ON 02/17/2020] citalopram (CELEXA) tablet 10 mg  10 mg Oral Daily Clapacs, John T,  MD      . gabapentin (NEURONTIN) capsule 400 mg  400 mg Oral TID [name], MD      . magnesium hydroxide (MILK OF MAGNESIA) suspension 30 mL  30 mL Oral Daily PRN Clapacs, John T, MD      . naproxen (NAPROSYN) tablet 375 mg  375 mg Oral BID WC [name], MD      . ondansetron [facility]) tablet 4 mg  4 mg Oral Q8H PRN Clapacs, John T, MD      . Derrill Memo ON 02/17/2020] pantoprazole (PROTONIX) EC tablet 80 mg  80 mg Oral Daily [name] M, MD      . QUEtiapine (SEROQUEL) tablet 50 mg  50 mg Oral QHS [name], MD      . traZODone (DESYREL) tablet 100 mg  100 mg Oral QHS PRN [name], MD       PTA Medications: Medications Prior to Admission  Medication Sig Dispense Refill Last Dose  . citalopram (CELEXA) 10 MG tablet Take 1 tablet (10 mg total) by mouth daily. 30 tablet 1   .  gabapentin (NEURONTIN) 300 MG capsule Take 1 capsule (300 mg total) by mouth 3 (three) times daily. 90 capsule 1   . mirtazapine (REMERON) 30 MG tablet Take 1 tablet (30 mg total) by mouth at bedtime. 30 tablet 1   . naproxen (NAPROSYN) 500 MG tablet Take 1 tablet (500 mg total) by mouth 2 (two) times daily with a meal. 60 tablet 0   . pantoprazole (PROTONIX) 40 MG tablet Take 2 tablets (80 mg total) by mouth daily. 60 tablet 1     Musculoskeletal: Strength & Muscle Tone: within normal limits Gait & Station: normal Patient leans: N/A  Psychiatric Specialty Exam: Physical Exam Vitals and nursing note reviewed.  Constitutional:      Appearance: Normal appearance.  HENT:     Head: Normocephalic and atraumatic.     Right Ear: External ear normal.     Left Ear: External ear normal.     Nose: Nose normal.     Mouth/Throat:     Mouth: Mucous membranes are moist.     Pharynx: Oropharynx is clear.  Eyes:     Extraocular Movements: Extraocular movements intact.     Conjunctiva/sclera: Conjunctivae normal.     Pupils: Pupils are equal, round, and reactive to light.  Cardiovascular:      Rate and Rhythm: Normal rate.     Pulses: Normal pulses.  Pulmonary:     Effort: Pulmonary effort is normal. No respiratory distress.  Abdominal:     General: Abdomen is flat.     Palpations: Abdomen is soft.  Musculoskeletal:        General: Tenderness present. Normal range of motion.     Cervical back: Normal range of motion and neck supple.  Skin:    General: Skin is warm and dry.  Neurological:     General: No focal deficit present.     Mental Status: He is alert and oriented to person, place, and time.  Psychiatric:        Attention and Perception: Attention and perception normal.        Mood and Affect: Mood is depressed. Affect is labile and tearful.        Speech: Speech normal.        Behavior: Behavior is agitated.        Thought Content: Thought content includes suicidal ideation.        Cognition and Memory: Cognition and memory normal.        Judgment: Judgment is impulsive.     Review of Systems  Constitutional: Positive for fatigue. Negative for appetite change.  HENT: Negative for rhinorrhea and sore throat.   Eyes: Negative for photophobia and visual disturbance.  Respiratory: Negative for cough and shortness of breath.   Cardiovascular: Negative for chest pain and palpitations.  Gastrointestinal: Negative for constipation, diarrhea, nausea and vomiting.  Endocrine: Negative for cold intolerance and heat intolerance.  Genitourinary: Negative for difficulty urinating and dysuria.  Musculoskeletal: Positive for arthralgias and myalgias.  Skin: Negative for rash and wound.  Allergic/Immunologic: Negative for food allergies and immunocompromised state.  Neurological: Negative for dizziness and headaches.  Hematological: Negative for adenopathy. Does not bruise/bleed easily.  Psychiatric/Behavioral: Positive for behavioral problems, dysphoric mood, sleep disturbance and suicidal ideas. The patient is nervous/anxious.     Blood pressure 132/79, pulse 61,  temperature 98.3 F (36.8 C), temperature source Oral, resp. rate 18, height 6\' 1"  (1.854 m), weight 96.6 kg, SpO2 100 %.Body mass index is 28.1 kg/m.  General Appearance:  Fairly Groomed  Eye Contact:  Good  Speech:  Clear and Coherent  Volume:  Normal  Mood:  Irritable  Affect:  Labile and Tearful  Thought Process:  Coherent  Orientation:  Full (Time, Place, and Person)  Thought Content:  Logical  Suicidal Thoughts:  Yes.  without intent/plan  Homicidal Thoughts:  No  Memory:  Immediate;   Fair Recent;   Fair Remote;   Fair  Judgement:  Intact  Insight:  Lacking  Psychomotor Activity:  Normal  Concentration:  Concentration: Fair  Recall:  [employer] of Knowledge:  Fair  Language:  Fair  Akathisia:  Negative  Handed:  Right  AIMS (if indicated):     Assets:  Communication Skills Desire for Improvement Financial Resources/Insurance Housing Social Support Talents/Skills  ADL's:  Intact  Cognition:  WNL  Sleep:       Treatment Plan Summary: Daily contact with patient to assess and evaluate symptoms and progress in treatment and Medication management Plan. Discontinue Remeron. Start Seroquel 50 mg QHS for mood and insomnia. Increase gabapentin to 400 mg TID and order PT consult for neuropathic pain. Continue Protonix, naproxen, and celexa.   Observation Level/Precautions:  15 minute checks  Laboratory:  lipid panel, hemoglobin a1c  Psychotherapy:    Medications:    Consultations:    Discharge Concerns:    Estimated LOS:  Other:     Physician Treatment Plan for Primary Diagnosis: Severe recurrent major depression without psychotic features ([place]) Long Term Goal(s): Improvement in symptoms so as ready for discharge  Short Term Goals: Ability to identify changes in lifestyle to reduce recurrence of condition will improve, Ability to verbalize feelings will improve, Ability to disclose and discuss suicidal ideas, Ability to demonstrate self-control will improve, Ability to  identify and develop effective coping behaviors will improve, Compliance with prescribed medications will improve and Ability to identify triggers associated with substance abuse/mental health issues will improve  Physician Treatment Plan for Secondary Diagnosis: Principal Problem:   Severe recurrent major depression without psychotic features ([place]) Active Problems:   Gastroesophageal reflux disease with esophagitis   Cocaine abuse ([place])   Arthritis of knee   Personality disorder ([place])  Long Term Goal(s): Improvement in symptoms so as ready for discharge  Short Term Goals: Ability to identify changes in lifestyle to reduce recurrence of condition will improve, Ability to verbalize feelings will improve, Ability to disclose and discuss suicidal ideas, Ability to demonstrate self-control will improve, Ability to identify and develop effective coping behaviors will improve, Compliance with prescribed medications will improve and Ability to identify triggers associated with substance abuse/mental health issues will improve  I certify that inpatient services furnished can reasonably be expected to improve the patient's condition.    [name], MD 10/25/20214:23 PM

## 2020-02-16 NOTE — Progress Notes (Signed)
Patient admitted from ED with recurrent  Major depression.Patient appears depressed but  cooperative during admission assessment. Patient denies SI/HI at this time. Patient denies AVH. Patient informed of fall risk status, fall risk assessed "low" at this time. Patient oriented to unit/staff/room. Patient denies any questions/concerns at this time. Patient safe on unit with Q15 minute checks for safety.Skin assessment and body search done,no contraband found.

## 2020-02-16 NOTE — Tx Team (Signed)
Initial Treatment Plan 02/16/2020 5:28 PM [name] NOB:[id number]    PATIENT STRESSORS: Financial difficulties Marital or family conflict Medication change or noncompliance   PATIENT STRENGTHS: Ability for insight Average or above average intelligence Communication skills Work skills   PATIENT IDENTIFIED PROBLEMS: Depression  Anxiety                   DISCHARGE CRITERIA:  Ability to meet basic life and health needs Medical problems require only outpatient monitoring Motivation to continue treatment in a less acute level of care Verbal commitment to aftercare and medication compliance  PRELIMINARY DISCHARGE PLAN: Attend aftercare/continuing care group Return to previous living arrangement  PATIENT/FAMILY INVOLVEMENT: This treatment plan has been presented to and reviewed with the patient, Samuel Molina, and/or family member,.  The patient and family have been given the opportunity to ask questions and make suggestions.  Merlene Morse, RN 02/16/2020, 5:28 PM

## 2020-02-16 NOTE — [facility] Suicide Risk Assessment (Signed)
[facility] Admission Suicide Risk Assessment   Nursing information obtained from:  Patient Demographic factors:  Male, Living alone, Low socioeconomic status Current Mental Status:  NA Loss Factors:  Loss of significant relationship, Financial problems / change in socioeconomic status Historical Factors:  NA Risk Reduction Factors:  Positive social support  Total Time spent with patient: 1 hour Principal Problem: Severe recurrent major depression without psychotic features ([place]) Diagnosis:  Principal Problem:   Severe recurrent major depression without psychotic features ([place]) Active Problems:   Gastroesophageal reflux disease with esophagitis   Cocaine abuse ([place])   Arthritis of knee   Personality disorder ([place])  Subjective Data: Patient recently discharged on Feb 03, 2020 after abruptly stating his son had come to town to take him to [place] to live. He was very excited about this plan, and was discharged on Celexa 10 mg daily, gabapentin 300 mg TID, mirtazapine 30 mg QHS, protonix 80 mg daily, and naproxen PRN. He states he did go to [place] with his son, but left soon after due to fear of ruining his son and daughter-in-laws marriage. He came back last week.  He returned to the emergency department on 02/12/20 reporting homicidal thoughts toward his cousin that resolved when he was unable to get a ED room. He left and returned several hours later with suicidal thoughts. He did not have a specific suicidal plan at that time, and was discharged again. He returned on Oct 23 stating he attempted suicide by taking 20 unknown tablets from a friend. He states he took them in a [employer] parking lot, and a boy that worked there called the police. He said he did feel somewhat better when he know someone cared enough to call 911 and wait with him. CBC, and CMP unremarkable. ASA and tylenol levels negative. Vitals stable.  UDS positive for cocaine.   On interview patient's mood is somewhat labile. He is  intermittently tearful, and then becomes angry on several occasions when he recounts his story. He feels that Remeron was not helpful for sleep or mood, and wishes to discontinue this medicine. RBA of Seroquel discussed for mood, insomnia, and impulsivity and he is agreeable to trying this medicine. Also agreeable to increase in gabapentin and physical therapy consult for neuropathic pain.   Continued Clinical Symptoms:  Alcohol Use Disorder Identification Test Final Score (AUDIT): 1 The "Alcohol Use Disorders Identification Test", Guidelines for Use in Primary Care, Second Edition.  World Pharmacologist [facility] [place] [facility]). Score between 0-7:  no or low risk or alcohol related problems. Score between 8-15:  moderate risk of alcohol related problems. Score between 16-19:  high risk of alcohol related problems. Score 20 or above:  warrants further diagnostic evaluation for alcohol dependence and treatment.   CLINICAL FACTORS:   Severe Anxiety and/or Agitation Depression:   Aggression Anhedonia Comorbid alcohol abuse/dependence Hopelessness Impulsivity Insomnia Severe Alcohol/Substance Abuse/Dependencies More than one psychiatric diagnosis Unstable or Poor Therapeutic Relationship Previous Psychiatric Diagnoses and Treatments Medical Diagnoses and Treatments/Surgeries   Musculoskeletal: Strength & Muscle Tone: within normal limits Gait & Station: normal Patient leans: N/A  Psychiatric Specialty Exam: Physical Exam Vitals and nursing note reviewed.  Constitutional:      Appearance: Normal appearance.  HENT:     Head: Normocephalic and atraumatic.     Right Ear: External ear normal.     Left Ear: External ear normal.     Nose: Nose normal.     Mouth/Throat:     Mouth: Mucous membranes are moist.  Pharynx: Oropharynx is clear.  Eyes:     Extraocular Movements: Extraocular movements intact.     Conjunctiva/sclera: Conjunctivae normal.     Pupils: Pupils are equal, round, and  reactive to light.  Cardiovascular:     Rate and Rhythm: Normal rate.     Pulses: Normal pulses.  Pulmonary:     Effort: Pulmonary effort is normal. No respiratory distress.  Abdominal:     General: Abdomen is flat.     Palpations: Abdomen is soft.  Musculoskeletal:        General: Tenderness present. Normal range of motion.     Cervical back: Normal range of motion and neck supple.  Skin:    General: Skin is warm and dry.  Neurological:     General: No focal deficit present.     Mental Status: He is alert and oriented to person, place, and time.  Psychiatric:        Attention and Perception: Attention and perception normal.        Mood and Affect: Mood is depressed. Affect is labile and tearful.        Speech: Speech normal.        Behavior: Behavior is agitated.        Thought Content: Thought content includes suicidal ideation.        Cognition and Memory: Cognition and memory normal.        Judgment: Judgment is impulsive.     Review of Systems  Constitutional: Positive for fatigue. Negative for appetite change.  HENT: Negative for rhinorrhea and sore throat.   Eyes: Negative for photophobia and visual disturbance.  Respiratory: Negative for cough and shortness of breath.   Cardiovascular: Negative for chest pain and palpitations.  Gastrointestinal: Negative for constipation, diarrhea, nausea and vomiting.  Endocrine: Negative for cold intolerance and heat intolerance.  Genitourinary: Negative for difficulty urinating and dysuria.  Musculoskeletal: Positive for arthralgias and myalgias.  Skin: Negative for rash and wound.  Allergic/Immunologic: Negative for food allergies and immunocompromised state.  Neurological: Negative for dizziness and headaches.  Hematological: Negative for adenopathy. Does not bruise/bleed easily.  Psychiatric/Behavioral: Positive for behavioral problems, dysphoric mood, sleep disturbance and suicidal ideas. The patient is nervous/anxious.      Blood pressure 132/79, pulse 61, temperature 98.3 F (36.8 C), temperature source Oral, resp. rate 18, height 6\' 1"  (1.854 m), weight 96.6 kg, SpO2 100 %.Body mass index is 28.1 kg/m.  General Appearance: Fairly Groomed  Eye Contact:  Good  Speech:  Clear and Coherent  Volume:  Normal  Mood:  Irritable  Affect:  Labile and Tearful  Thought Process:  Coherent  Orientation:  Full (Time, Place, and Person)  Thought Content:  Logical  Suicidal Thoughts:  Yes.  without intent/plan  Homicidal Thoughts:  No  Memory:  Immediate;   Fair Recent;   Fair Remote;   Fair  Judgement:  Intact  Insight:  Lacking  Psychomotor Activity:  Normal  Concentration:  Concentration: Fair  Recall:  [employer] of Knowledge:  Fair  Language:  Fair  Akathisia:  Negative  Handed:  Right  AIMS (if indicated):     Assets:  Communication Skills Desire for Improvement Financial Resources/Insurance Housing Social Support Talents/Skills  ADL's:  Intact  Cognition:  WNL  Sleep:            COGNITIVE FEATURES THAT CONTRIBUTE TO RISK:  Polarized thinking    SUICIDE RISK:   Severe:  Frequent, intense, and enduring suicidal ideation, specific  plan, no subjective intent, but some objective markers of intent (i.e., choice of lethal method), the method is accessible, some limited preparatory behavior, evidence of impaired self-control, severe dysphoria/symptomatology, multiple risk factors present, and few if any protective factors, particularly a lack of social support.  PLAN OF CARE: Continue inpatient admission. Daily contact with patient to assess and evaluate symptoms and progress in treatment and Medication management Plan. Discontinue Remeron. Start Seroquel 50 mg QHS for mood and insomnia. Increase gabapentin to 400 mg TID and order PT consult for neuropathic pain. Continue Protonix, naproxen, and celexa.   I certify that inpatient services furnished can reasonably be expected to improve the patient's  condition.   [name], MD 02/16/2020, 4:26 PM

## 2020-02-16 NOTE — ED Notes (Signed)
Pt discharged to BMU under IVC.  All belongings sent with patient.  Pt accepting of disposition.

## 2020-02-16 NOTE — ED Notes (Signed)
Pt taking a shower 

## 2020-02-16 NOTE — Consult Note (Signed)
[facility] Face-to-Face Psychiatry Consult   Reason for Consult: Consult for this 59 year old man with substance abuse and personality disorder and depression who comes back to the hospital claiming to have tried to kill himself Referring Physician: Paduchowski Patient Identification: Samuel Molina MRN:  [id number] Principal Diagnosis: Severe recurrent major depression without psychotic features ([place]) Diagnosis:  Principal Problem:   Severe recurrent major depression without psychotic features ([place]) Active Problems:   Cocaine abuse ([place])   Substance induced mood disorder ([place])   Personality disorder ([place])   Total Time spent with patient: 1 hour  Subjective:   Samuel Molina is a 59 y.o. male patient admitted with "I told you that I would try to kill myself".  HPI: Patient seen chart reviewed.  59 year old man who was seen in the emergency room on Friday.  He was discharged from the emergency room dissatisfied with the plan.  Reports that he went to someone else's home and took approximately 20 tablets of unknown type from that person's medicine cabinet.  Patient claims that a child saw him and called 911.  He says that he was having suicidal thoughts at the time.  Continues to endorse depressed mood feelings of hopelessness.  Patient has had several repeated hospitalizations and presentations to the emergency room recently for substance abuse and mood problems and has been resistant to appropriate follow-up.  Past Psychiatric History: Past history of at least 2 prior inpatient treatments here back to back.  On both admissions he was not really cooperative with much treatment in the hospital but demanded referral to inpatient rehab then left the hospital early when things did not happen quickly enough.  No known prior suicide attempts.  Risk to Self: Suicidal Ideation: Yes-Currently Present Suicidal Intent: Yes-Currently Present Is patient at risk for suicide?: Yes Suicidal Plan?:  Yes-Currently Present Specify Current Suicidal Plan:  (overdosed) Access to Means: Yes Triggers for Past Attempts: None known Intentional Self Injurious Behavior:  (overdosed) Risk to Others: Homicidal Ideation: Yes-Currently Present Prior Inpatient Therapy: Prior Inpatient Therapy: Yes Prior Therapy Dates: 01/2020, 12/2019 Prior Therapy Facilty/Provider(s): [facility] Reason for Treatment: Major Depression,  Personality Disorder Prior Outpatient Therapy: Prior Outpatient Therapy: No Does patient have an ACCT team?: No Does patient have Intensive In-House Services?  : No Does patient have [facility] services? : No Does patient have P4CC services?: No  Past Medical History:  Past Medical History:  Diagnosis Date  . Anxiety   . Depression   . GERD (gastroesophageal reflux disease)   . Obesity   . Substance abuse [facility])     Past Surgical History:  Procedure Laterality Date  . ESOPHAGOGASTRODUODENOSCOPY (EGD) WITH PROPOFOL N/A 09/20/2017   Procedure: ESOPHAGOGASTRODUODENOSCOPY (EGD) WITH PROPOFOL;  Surgeon: Lin Landsman, MD;  Location: [facility];  Service: Gastroenterology;  Laterality: N/A;  . KNEE SURGERY Right    Family History: History reviewed. No pertinent family history. Family Psychiatric  History: None reported Social History:  Social History   Substance and Sexual Activity  Alcohol Use Not Currently     Social History   Substance and Sexual Activity  Drug Use Yes  . Types: Cocaine    Social History   Socioeconomic History  . Marital status: Single    Spouse name: Not on file  . Number of children: Not on file  . Years of education: Not on file  . Highest education level: Not on file  Occupational History  . Not on file  Tobacco Use  . Smoking status: Current Every  Day Smoker    Packs/day: 0.50    Types: Cigarettes  . Smokeless tobacco: Never Used  Vaping Use  . Vaping Use: Never used  Substance and Sexual Activity  . Alcohol use: Not Currently   . Drug use: Yes    Types: Cocaine  . Sexual activity: Not on file  Other Topics Concern  . Not on file  Social History Narrative  . Not on file   Social Determinants of Health   Financial Resource Strain:   . Difficulty of Paying Living Expenses: Not on file  Food Insecurity:   . Worried About Charity fundraiser in the Last Year: Not on file  . Ran Out of Food in the Last Year: Not on file  Transportation Needs:   . Lack of Transportation (Medical): Not on file  . Lack of Transportation (Non-Medical): Not on file  Physical Activity:   . Days of Exercise per Week: Not on file  . Minutes of Exercise per Session: Not on file  Stress:   . Feeling of Stress : Not on file  Social Connections:   . Frequency of Communication with Friends and Family: Not on file  . Frequency of Social Gatherings with Friends and Family: Not on file  . Attends Religious Services: Not on file  . Active Member of Clubs or Organizations: Not on file  . Attends Archivist Meetings: Not on file  . Marital Status: Not on file   Additional Social History:    Allergies:   Allergies  Allergen Reactions  . Acetaminophen Itching, Nausea And Vomiting and Rash    Labs:  Results for orders placed or performed during the hospital encounter of 02/14/20 (from the past 48 hour(s))  Acetaminophen level     Status: Abnormal   Collection Time: 02/14/20 10:21 PM  Result Value Ref Range   Acetaminophen (Tylenol), Serum <10 (L) 10 - 30 ug/mL    Comment: (NOTE) Therapeutic concentrations vary significantly. A range of 10-30 ug/mL  may be an effective concentration for many patients. However, some  are best treated at concentrations outside of this range. Acetaminophen concentrations >150 ug/mL at 4 hours after ingestion  and >50 ug/mL at 12 hours after ingestion are often associated with  toxic reactions.  Performed at [facility], [address] [place], [place] [address]   CBC with  Differential     Status: None   Collection Time: 02/14/20 10:21 PM  Result Value Ref Range   WBC 8.5 4.0 - 10.5 K/uL   RBC 5.35 4.22 - 5.81 MIL/uL   Hemoglobin 13.9 13.0 - 17.0 g/dL   HCT 42.9 39 - 52 %   MCV 80.2 80.0 - 100.0 fL   MCH 26.0 26.0 - 34.0 pg   MCHC 32.4 30.0 - 36.0 g/dL   RDW 14.5 11.5 - 15.5 %   Platelets 331 150 - 400 K/uL   nRBC 0.0 0.0 - 0.2 %   Neutrophils Relative % 61 %   Neutro Abs 5.2 1.7 - 7.7 K/uL   Lymphocytes Relative 26 %   Lymphs Abs 2.2 0.7 - 4.0 K/uL   Monocytes Relative 10 %   Monocytes Absolute 0.9 0.1 - 1.0 K/uL   Eosinophils Relative 2 %   Eosinophils Absolute 0.1 0.0 - 0.5 K/uL   Basophils Relative 1 %   Basophils Absolute 0.0 0.0 - 0.1 K/uL   Immature Granulocytes 0 %   Abs Immature Granulocytes 0.03 0.00 - 0.07 K/uL  Comment: Performed at [facility], [address] [place], [place] [address]  Comprehensive metabolic panel     Status: Abnormal   Collection Time: 02/14/20 10:21 PM  Result Value Ref Range   Sodium 141 135 - 145 mmol/L   Potassium 3.4 (L) 3.5 - 5.1 mmol/L   Chloride 107 98 - 111 mmol/L   CO2 27 22 - 32 mmol/L   Glucose, Bld 105 (H) 70 - 99 mg/dL    Comment: Glucose reference range applies only to samples taken after fasting for at least 8 hours.   BUN 17 6 - 20 mg/dL   Creatinine, Ser 0.83 0.61 - 1.24 mg/dL   Calcium 9.0 8.9 - 10.3 mg/dL   Total Protein 7.2 6.5 - 8.1 g/dL   Albumin 3.8 3.5 - 5.0 g/dL   AST 19 15 - 41 U/L   ALT 14 0 - 44 U/L   Alkaline Phosphatase 80 38 - 126 U/L   Total Bilirubin 0.5 0.3 - 1.2 mg/dL   GFR, Estimated >60 >60 mL/min    Comment: (NOTE) Calculated using the CKD-EPI Creatinine Equation (2021)    Anion gap 7 5 - 15    Comment: Performed at [facility], [address] [place], [place] [address]  Ethanol     Status: None   Collection Time: 02/14/20 10:21 PM  Result Value Ref Range   Alcohol, Ethyl (B) <10 <10 mg/dL    Comment: (NOTE) Lowest detectable limit  for serum alcohol is 10 mg/dL.  For medical purposes only. Performed at [facility], [address] [place], [place] [address]   Lipase, blood     Status: None   Collection Time: 02/14/20 10:21 PM  Result Value Ref Range   Lipase 37 11 - 51 U/L    Comment: Performed at [facility], [address] [place], [place] [address]  Salicylate level     Status: Abnormal   Collection Time: 02/14/20 10:21 PM  Result Value Ref Range   Salicylate Lvl <6.3 (L) 7.0 - 30.0 mg/dL    Comment: Performed at [facility], [address] [place], [place] [address]  Urine Drug Screen, Qualitative     Status: Abnormal   Collection Time: 02/15/20  2:14 AM  Result Value Ref Range   Tricyclic, Ur Screen NONE DETECTED NONE DETECTED   Amphetamines, Ur Screen NONE DETECTED NONE DETECTED   MDMA (Ecstasy)Ur Screen NONE DETECTED NONE DETECTED   Cocaine Metabolite,Ur [place] POSITIVE (A) NONE DETECTED   Opiate, Ur Screen NONE DETECTED NONE DETECTED   Phencyclidine (PCP) Ur S NONE DETECTED NONE DETECTED   Cannabinoid 50 Ng, Ur [place] NONE DETECTED NONE DETECTED   Barbiturates, Ur Screen NONE DETECTED NONE DETECTED   Benzodiazepine, Ur Scrn NONE DETECTED NONE DETECTED   Methadone Scn, Ur NONE DETECTED NONE DETECTED    Comment: (NOTE) Tricyclics + metabolites, urine    Cutoff 1000 ng/mL Amphetamines + metabolites, urine  Cutoff 1000 ng/mL MDMA (Ecstasy), urine              Cutoff 500 ng/mL Cocaine Metabolite, urine          Cutoff 300 ng/mL Opiate + metabolites, urine        Cutoff 300 ng/mL Phencyclidine (PCP), urine         Cutoff 25 ng/mL Cannabinoid, urine                 Cutoff 50 ng/mL Barbiturates + metabolites, urine  Cutoff 200  ng/mL Benzodiazepine, urine              Cutoff 200 ng/mL Methadone, urine                   Cutoff 300 ng/mL  The urine drug screen provides only a preliminary, unconfirmed analytical test result and should not be used for non-medical purposes.  Clinical consideration and professional judgment should be applied to any positive drug screen result due to possible interfering substances. A more specific alternate chemical method must be used in order to obtain a confirmed analytical result. Gas chromatography / mass spectrometry (GC/MS) is the preferred confirm atory method. Performed at [facility], [address] [place], [place] [address]   Acetaminophen level     Status: Abnormal   Collection Time: 02/15/20  2:14 AM  Result Value Ref Range   Acetaminophen (Tylenol), Serum <10 (L) 10 - 30 ug/mL    Comment: (NOTE) Therapeutic concentrations vary significantly. A range of 10-30 ug/mL  may be an effective concentration for many patients. However, some  are best treated at concentrations outside of this range. Acetaminophen concentrations >150 ug/mL at 4 hours after ingestion  and >50 ug/mL at 12 hours after ingestion are often associated with  toxic reactions.  Performed at [facility], [address] [place], [place] [address]   Salicylate level     Status: Abnormal   Collection Time: 02/15/20  2:14 AM  Result Value Ref Range   Salicylate Lvl <0.0 (L) 7.0 - 30.0 mg/dL    Comment: Performed at [facility], [address] [place], [place] [address]  Respiratory Panel by RT PCR (Flu A&B, Covid) - Nasopharyngeal Swab     Status: None   Collection Time: 02/15/20  6:34 AM   Specimen: Nasopharyngeal Swab  Result Value Ref Range   SARS Coronavirus 2 by RT PCR NEGATIVE NEGATIVE    Comment: (NOTE) SARS-CoV-2 target nucleic acids are NOT DETECTED.  The SARS-CoV-2 RNA is generally detectable in upper respiratoy specimens during the acute phase of infection. The lowest concentration of SARS-CoV-2 viral copies this assay can detect is 131 copies/mL. A negative result does not preclude SARS-Cov-2 infection and should not be used as the sole basis for treatment or other patient management  decisions. A negative result may occur with  improper specimen collection/handling, submission of specimen other than nasopharyngeal swab, presence of viral mutation(s) within the areas targeted by this assay, and inadequate number of viral copies (<131 copies/mL). A negative result must be combined with clinical observations, patient history, and epidemiological information. The expected result is Negative.  Fact Sheet for Patients:  PinkCheek.be  Fact Sheet for Healthcare Providers:  GravelBags.it  This test is no t yet approved or cleared by the [place] FDA and  has been authorized for detection and/or diagnosis of SARS-CoV-2 by FDA under an Emergency Use Authorization (EUA). This EUA will remain  in effect (meaning this test can be used) for the duration of the COVID-19 declaration under Section 564(b)(1) of the Act, 21 U.S.C. section 360bbb-3(b)(1), unless the authorization is terminated or revoked sooner.     Influenza A by PCR NEGATIVE NEGATIVE   Influenza B by PCR NEGATIVE NEGATIVE    Comment: (NOTE) The Xpert Xpress SARS-CoV-2/FLU/RSV assay is intended as an aid in  the diagnosis of influenza from Nasopharyngeal swab specimens and  should not be used as a sole basis for treatment. Nasal washings and  aspirates are unacceptable for Xpert  Xpress SARS-CoV-2/FLU/RSV  testing.  Fact Sheet for Patients: PinkCheek.be  Fact Sheet for Healthcare Providers: GravelBags.it  This test is not yet approved or cleared by the [place] FDA and  has been authorized for detection and/or diagnosis of SARS-CoV-2 by  FDA under an Emergency Use Authorization (EUA). This EUA will remain  in effect (meaning this test can be used) for the duration of the  Covid-19 declaration under Section 564(b)(1) of the Act, 21  U.S.C. section 360bbb-3(b)(1), unless the  authorization is  terminated or revoked. Performed at [facility], [address] [place], [place] [address]     Current Facility-Administered Medications  Medication Dose Route Frequency Provider Last Rate Last Admin  . alum & mag hydroxide-simeth (MAALOX/MYLANTA) 200-200-20 MG/5ML suspension 30 mL  30 mL Oral Q6H PRN Carrie Mew, MD      . citalopram (CELEXA) tablet 10 mg  10 mg Oral Daily Khalib Fendley, [name], MD   10 mg at 02/16/20 0916  . gabapentin (NEURONTIN) capsule 300 mg  300 mg Oral TID Dru Primeau, [name], MD   300 mg at 02/16/20 0916  . mirtazapine (REMERON) tablet 30 mg  30 mg Oral QHS Cam Harnden T, MD   30 mg at 02/15/20 2310  . naproxen (NAPROSYN) tablet 500 mg  500 mg Oral BID WC Laterrance Nauta, [name], MD   500 mg at 02/16/20 0916  . ondansetron (ZOFRAN) tablet 4 mg  4 mg Oral Q8H PRN Carrie Mew, MD   4 mg at 02/14/20 2352  . pantoprazole (PROTONIX) EC tablet 80 mg  80 mg Oral Daily Julieth Tugman, [name], MD   80 mg at 02/16/20 9678   Current Outpatient Medications  Medication Sig Dispense Refill  . citalopram (CELEXA) 10 MG tablet Take 1 tablet (10 mg total) by mouth daily. 30 tablet 1  . gabapentin (NEURONTIN) 300 MG capsule Take 1 capsule (300 mg total) by mouth 3 (three) times daily. 90 capsule 1  . mirtazapine (REMERON) 30 MG tablet Take 1 tablet (30 mg total) by mouth at bedtime. 30 tablet 1  . naproxen (NAPROSYN) 500 MG tablet Take 1 tablet (500 mg total) by mouth 2 (two) times daily with a meal. 60 tablet 0  . pantoprazole (PROTONIX) 40 MG tablet Take 2 tablets (80 mg total) by mouth daily. 60 tablet 1    Musculoskeletal: Strength & Muscle Tone: within normal limits Gait & Station: normal Patient leans: N/A  Psychiatric Specialty Exam: Physical Exam Vitals and nursing note reviewed.  Constitutional:      Appearance: He is well-developed.  HENT:     Head: Normocephalic and atraumatic.  Eyes:     Conjunctiva/sclera: Conjunctivae normal.      Pupils: Pupils are equal, round, and reactive to light.  Cardiovascular:     Heart sounds: Normal heart sounds.  Pulmonary:     Effort: Pulmonary effort is normal.  Abdominal:     Palpations: Abdomen is soft.  Musculoskeletal:        General: Normal range of motion.     Cervical back: Normal range of motion.  Skin:    General: Skin is warm and dry.  Neurological:     General: No focal deficit present.     Mental Status: He is alert.  Psychiatric:        Attention and Perception: Attention normal.        Mood and Affect: Mood is anxious and depressed.        Speech: Speech normal.  Behavior: Behavior is cooperative.        Thought Content: Thought content includes suicidal ideation. Thought content includes suicidal plan.        Cognition and Memory: Cognition normal.        Judgment: Judgment is impulsive and inappropriate.     Review of Systems  Constitutional: Negative.   HENT: Negative.   Eyes: Negative.   Respiratory: Negative.   Cardiovascular: Negative.   Gastrointestinal: Negative.   Musculoskeletal: Negative.   Skin: Negative.   Neurological: Negative.   Psychiatric/Behavioral: Positive for dysphoric mood, self-injury and suicidal ideas.    Blood pressure 102/64, pulse 60, temperature 98 F (36.7 C), temperature source Oral, resp. rate 17, height 6\' 1"  (1.854 m), weight 98 kg, SpO2 99 %.Body mass index is 28.5 kg/m.  General Appearance: Casual  Eye Contact:  Fair  Speech:  Clear and Coherent  Volume:  Normal  Mood:  Dysphoric and Irritable  Affect:  Congruent  Thought Process:  Coherent  Orientation:  Full (Time, Place, and Person)  Thought Content:  Logical  Suicidal Thoughts:  Yes.  with intent/plan  Homicidal Thoughts:  No  Memory:  Immediate;   Fair Recent;   Poor Remote;   Fair  Judgement:  Impaired  Insight:  Shallow  Psychomotor Activity:  Decreased  Concentration:  Concentration: Poor  Recall:  [employer] of Knowledge:  Fair  Language:   Fair  Akathisia:  No  Handed:  Right  AIMS (if indicated):     Assets:  Desire for Improvement Resilience  ADL's:  Impaired  Cognition:  WNL  Sleep:        Treatment Plan Summary: Daily contact with patient to assess and evaluate symptoms and progress in treatment, Medication management and Plan 59 year old man with cocaine abuse and personality disorder which seems to feature paranoid and narcissistic traits.  Unclear whether he is telling the truth about taking pills since he says he does not remember what they were and so we do not have any way to directly test for them but he does not seem to be in any current health distress.  Tylenol level was 0.  Patient is insisting that he needs inpatient treatment and "mood stabilizing medicine" in order to be safe.  As previously I have pointed out to him that compliance with outpatient treatment would probably serve him better but his insight is limited.  I will go ahead and plan to admit him to the inpatient psychiatric ward.  Continue 15-minute checks.  Review labs.  Full treatment team can discuss with him whether any change to medicine is needed.  Disposition: Recommend psychiatric Inpatient admission when medically cleared.  Alethia Berthold, MD 02/16/2020 11:31 AM

## 2020-02-17 DIAGNOSIS — F332 Major depressive disorder, recurrent severe without psychotic features: Secondary | ICD-10-CM | POA: Diagnosis not present

## 2020-02-17 LAB — HEMOGLOBIN A1C
Hgb A1c MFr Bld: 5.7 % — ABNORMAL HIGH (ref 4.8–5.6)
Mean Plasma Glucose: 117 mg/dL

## 2020-02-17 MED ORDER — GABAPENTIN 300 MG PO CAPS
600.0000 mg | ORAL_CAPSULE | Freq: Three times a day (TID) | ORAL | Status: DC
  Administered 2020-02-17 – 2020-02-18 (×2): 600 mg via ORAL
  Filled 2020-02-17 (×2): qty 2

## 2020-02-17 NOTE — BHH Counselor (Signed)
Adult Comprehensive Assessment  Patient ID: Rayburn Mundis, male   DOB: [birth year], 59 y.o.   MRN: [id number]  Information Source: patient, previous PSA from 01/29/20 admission   Current Stressors:  Patient states their primary concerns and needs for treatment are:: "Attempted suicide, just didn't want to be here anymore, despair, anxiety." Patient states their goals for this hospitilization and ongoing recovery are:: "Trying to get int a program and take psychiatric medicine."  Educational / Learning stressors: Pt denies. Employment / Job issues: Pt denies. Family Relationships: None disclosed Financial / Lack of resources (include bankruptcy): Pt denies. Housing / Lack of housing: Pt denies. Physical health (include injuries & life threatening diseases): Left hip pain Social relationships: Has some friends in [place] Substance abuse: "Cocaine" Bereavement / Loss: Patient just lost a friend and attended the funeral recently.   Living/Environment/Situation:  Living Arrangements: Alone Living conditions (as described by patient or guardian): Renting townhome but recently lost it Who else lives in the home?: Living alone How long has patient lived in current situation?: "2 1/2 years" What is atmosphere in current home: Comfortable   Family History:  Marital status: Single Are you sexually active?: Yes What is your sexual orientation?: I love women Has your sexual activity been affected by drugs, alcohol, medication, or emotional stress?: No Does patient have children?: Yes How many children?: 2 How is patient's relationship with their children?: "pretty good"   Childhood History:  By whom was/is the patient raised?: Both parents Description of patient's relationship with caregiver when they were a child: "great" Patient's description of current relationship with people who raised him/her: Both parents are deceased How were you disciplined when you got in trouble as a  child/adolescent?: "physically and mentally" Does patient have siblings?: Yes Number of Siblings: 1 Description of patient's current relationship with siblings: "great" Has patient ever been sexually abused/assaulted/raped as an adolescent or adult?: No Was the patient ever a victim of a crime or a disaster?: No Witnessed domestic violence?: Yes Has patient been affected by domestic violence as an adult?: No   Education:  Highest grade of school patient has completed: Buyer, retail degree in criminlogy Currently a student?: No Learning disability?: No   Employment/Work Situation:   Employment situation: Unemployed Where is patient currently employed?: Not currently working How long has patient been employed?: "3 years" Patient's job has been impacted by current illness: No What is the longest time patient has a held a job?: "18 years" Where was the patient employed at that time?: "[employer]" Has patient ever been in the [employer]?: No   Financial Resources:   Museum/gallery curator resources: No income Does patient have a Programmer, applications or guardian?: No   Alcohol/Substance Abuse:   What has been your use of drugs/alcohol within the last 12 months?: Cocaine, alcohol, marijuana If attempted suicide, did drugs/alcohol play a role in this?: No Alcohol/Substance Abuse Treatment Hx: Past Tx, Inpatient If yes, describe treatment: Patient was at [facility] a week ago due to depression and anxiety Has alcohol/substance abuse ever caused legal problems?: No   Social Support System:   Pensions consultant Support System: Fair Dietitian Support System: "sister" Type of faith/religion: Christian How does patient's faith help to cope with current illness?: Yes   Leisure/Recreation:   Do You Have Hobbies?: Yes Leisure and Hobbies: "poetry, basketball"   Strengths/Needs:   What is the patient's perception of their strengths?: Communication, assertiveness, decision making sometimes Patient states  they can use these personal strengths during their treatment  to contribute to their recovery: Yes Patient states these barriers may affect/interfere with their treatment: Pt denies. Patient states these barriers may affect their return to the community: Patient is able to stay with sister but would like to go to a treatment center   Discharge Plan:   Currently receiving community mental health services: No Patient states concerns and preferences for aftercare planning are: Patient would like inpatient drug treatment Patient states they will know when they are safe and ready for discharge when: "I don't know" Does patient have access to transportation?: Yes Does patient have financial barriers related to discharge medications?: No Patient description of barriers related to discharge medications: Patient stated he has no issues getting his medication. Plan for living situation after discharge: Patient plans on going to a treatment facility or [facility]. Will patient be returning to same living situation after discharge?: No    Summary/Recommendations:   Summary and Recommendations (to be completed by the evaluator): Patient is a 59 year old male from [place] [facility] due to recent suicide attempt via overdose on medication from a friend. Patient is unemployed and does not have insurance. Patient shares that he went to [place] to stay with his son after previous discharge on 02/03/20 but returned to [facility] because he felt like him being there would cause issues in his son's relationship. This is his second time at [facility] ED since his return to [facility]. Patient denies any outpatient services and is seeking inpatient treatment (also open to [facility]). 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 Recommendations include: crisis stabilization, therapeutic milieu, encouraged group attendance and participation,  medication management for mood stabilization and development of comprehensive mental wellness/sobriety plan.  [name]. 02/17/2020

## 2020-02-17 NOTE — Progress Notes (Signed)
Recreation Therapy Notes  INPATIENT RECREATION THERAPY ASSESSMENT  Patient Details Name: Samuel Molina MRN: [id number] DOB: 11-Jan-1961 Today's Date: 02/17/2020       Information Obtained From: Patient  Able to Participate in Assessment/Interview: Yes  Patient Presentation: Responsive  Reason for Admission (Per Patient): Active Symptoms, Suicidal Ideation  Patient Stressors:    Coping Skills:   Sports, TV  Leisure Interests (2+):  Individual - Writing, Sports - Basketball  Frequency of Recreation/Participation: Monthly  Awareness of Community Resources:     [employer]:     Current Use:    If no, Barriers?:    Expressed Interest in [employer] Information:    [employer] of Residence:  Insurance underwriter  Patient Main Form of Transportation: Other (Comment) (Family)  Patient Strengths:  Communication  Patient Identified Areas of Improvement:  Get Treatment  Patient Goal for Hospitalization:  Get into a treatment center  Current SI (including self-harm):  No  Current HI:  No  Current AVH: No  Staff Intervention Plan: Group Attendance, Collaborate with Interdisciplinary Treatment Team  Consent to Intern Participation: N/A  Karen Huhta 02/17/2020, 3:40 PM

## 2020-02-17 NOTE — BHH Group Notes (Signed)

## 2020-02-17 NOTE — Progress Notes (Signed)
[facility] MD Progress Note  02/17/2020 3:10 PM Samuel Molina  MRN:  [id number]   Subjective:  Samuel Molina seen at bedside today. He states that he is feeling extremely depressed today, and had suicidal thoughts this morning. He states he was able to talk himself out of killing himself here in the hospital. He denies homicidal ideations, visual hallucinations, or auditory hallucinations. He states he is going to try and stay positive by getting out of bed to shower, write poetry, and see the sunlight. He recites a poem he wrote to his mother. He continues to have neuropathic pain along his left hip. He also shows me some marks in bilateral armpits that appear to be hidradenitis suppurativa.   Principal Problem: Severe recurrent major depression without psychotic features ([place]) Diagnosis: Principal Problem:   Severe recurrent major depression without psychotic features ([place]) Active Problems:   Gastroesophageal reflux disease with esophagitis   Cocaine abuse ([place])   Arthritis of knee   Personality disorder ([place])  Total Time spent with patient: 30 minutes  Past Psychiatric History: Past history of at least 3 prior inpatient treatments here back to back. On admissions he was not really cooperative with much treatment in the hospital but demanded referral to inpatient rehab then left the hospital early when things did not happen quickly enough. No known prior suicide attempts prior to this most recent attempt.   Past Medical History:  Past Medical History:  Diagnosis Date  . Anxiety   . Depression   . GERD (gastroesophageal reflux disease)   . Obesity   . Substance abuse [facility])     Past Surgical History:  Procedure Laterality Date  . ESOPHAGOGASTRODUODENOSCOPY (EGD) WITH PROPOFOL N/A 09/20/2017   Procedure: ESOPHAGOGASTRODUODENOSCOPY (EGD) WITH PROPOFOL;  Surgeon: Lin Landsman, MD;  Location: [place];  Service: Gastroenterology;  Laterality: N/A;  . KNEE SURGERY Right     Family History: History reviewed. No pertinent family history. Family Psychiatric  History: Denies Social History:  Social History   Substance and Sexual Activity  Alcohol Use Not Currently     Social History   Substance and Sexual Activity  Drug Use Yes  . Types: Cocaine    Social History   Socioeconomic History  . Marital status: Single    Spouse name: Not on file  . Number of children: Not on file  . Years of education: Not on file  . Highest education level: Not on file  Occupational History  . Not on file  Tobacco Use  . Smoking status: Current Every Day Smoker    Packs/day: 0.50    Types: Cigarettes  . Smokeless tobacco: Never Used  Vaping Use  . Vaping Use: Never used  Substance and Sexual Activity  . Alcohol use: Not Currently  . Drug use: Yes    Types: Cocaine  . Sexual activity: Not on file  Other Topics Concern  . Not on file  Social History Narrative  . Not on file   Social Determinants of Health   Financial Resource Strain:   . Difficulty of Paying Living Expenses: Not on file  Food Insecurity:   . Worried About Charity fundraiser in the Last Year: Not on file  . Ran Out of Food in the Last Year: Not on file  Transportation Needs:   . Lack of Transportation (Medical): Not on file  . Lack of Transportation (Non-Medical): Not on file  Physical Activity:   . Days of Exercise per Week: Not on file  .  Minutes of Exercise per Session: Not on file  Stress:   . Feeling of Stress : Not on file  Social Connections:   . Frequency of Communication with Friends and Family: Not on file  . Frequency of Social Gatherings with Friends and Family: Not on file  . Attends Religious Services: Not on file  . Active Member of Clubs or Organizations: Not on file  . Attends Archivist Meetings: Not on file  . Marital Status: Not on file   Additional Social History:                         Sleep: Poor  Appetite:  Fair  Current  Medications: Current Facility-Administered Medications  Medication Dose Route Frequency Provider Last Rate Last Admin  . alum & mag hydroxide-simeth (MAALOX/MYLANTA) 200-200-20 MG/5ML suspension 30 mL  30 mL Oral Q4H PRN Clapacs, John T, MD      . alum & mag hydroxide-simeth (MAALOX/MYLANTA) 200-200-20 MG/5ML suspension 30 mL  30 mL Oral Q6H PRN Clapacs, John T, MD      . citalopram (CELEXA) tablet 10 mg  10 mg Oral Daily Clapacs, [name], MD   10 mg at 02/17/20 0803  . gabapentin (NEURONTIN) capsule 400 mg  400 mg Oral TID [name], MD   400 mg at 02/17/20 1150  . magnesium hydroxide (MILK OF MAGNESIA) suspension 30 mL  30 mL Oral Daily PRN Clapacs, John T, MD      . naproxen (NAPROSYN) tablet 375 mg  375 mg Oral BID WC [name], MD   375 mg at 02/17/20 1150  . ondansetron (ZOFRAN) tablet 4 mg  4 mg Oral Q8H PRN Clapacs, John T, MD      . pantoprazole (PROTONIX) EC tablet 80 mg  80 mg Oral Daily [name], MD   80 mg at 02/17/20 0803  . QUEtiapine (SEROQUEL) tablet 50 mg  50 mg Oral QHS [name], MD   50 mg at 02/16/20 2115  . traZODone (DESYREL) tablet 100 mg  100 mg Oral QHS PRN [name], MD   100 mg at 02/16/20 2115    Lab Results:  Results for orders placed or performed during the hospital encounter of 02/16/20 (from the past 48 hour(s))  Lipid panel     Status: Abnormal   Collection Time: 02/16/20  5:51 PM  Result Value Ref Range   Cholesterol 171 0 - 200 mg/dL   Triglycerides 330 (H) <150 mg/dL   HDL 36 (L) >40 mg/dL   Total CHOL/HDL Ratio 4.8 RATIO   VLDL 66 (H) 0 - 40 mg/dL   LDL Cholesterol 69 0 - 99 mg/dL    Comment:        Total Cholesterol/HDL:CHD Risk Coronary Heart Disease Risk Table                     Men   Women  1/2 Average Risk   3.4   3.3  Average Risk       5.0   4.4  2 X Average Risk   9.6   7.1  3 X Average Risk  23.4   11.0        Use the calculated Patient Ratio above and the CHD Risk Table to determine the patient's  CHD Risk.        ATP III CLASSIFICATION (LDL):  <100     mg/dL   Optimal  100-129  mg/dL   Near or Above                    Optimal  130-159  mg/dL   Borderline  160-189  mg/dL   High  >190     mg/dL   Very High Performed at [facility], [address] [place], [place] [address]     Blood Alcohol level:  Lab Results  Component Value Date   [facility] <10 02/14/2020   ETH <10 60/45/4098    Metabolic Disorder Labs: No results found for: HGBA1C, MPG No results found for: PROLACTIN Lab Results  Component Value Date   CHOL 171 02/16/2020   TRIG 330 (H) 02/16/2020   HDL 36 (L) 02/16/2020   CHOLHDL 4.8 02/16/2020   VLDL 66 (H) 02/16/2020   LDLCALC 69 02/16/2020    Physical Findings: AIMS:  , ,  ,  ,    CIWA:    COWS:     Musculoskeletal: Strength & Muscle Tone: within normal limits Gait & Station: normal Patient leans: N/A  Psychiatric Specialty Exam: Physical Exam Vitals and nursing note reviewed.  Constitutional:      Appearance: Normal appearance.  HENT:     Head: Normocephalic and atraumatic.     Right Ear: External ear normal.     Left Ear: External ear normal.     Nose: Nose normal.     Mouth/Throat:     Mouth: Mucous membranes are moist.     Pharynx: Oropharynx is clear.  Eyes:     Extraocular Movements: Extraocular movements intact.     Conjunctiva/sclera: Conjunctivae normal.     Pupils: Pupils are equal, round, and reactive to light.  Cardiovascular:     Rate and Rhythm: Normal rate.     Pulses: Normal pulses.  Pulmonary:     Effort: Pulmonary effort is normal.     Breath sounds: Normal breath sounds.  Abdominal:     General: Abdomen is flat.     Palpations: Abdomen is soft.  Musculoskeletal:        General: No swelling. Normal range of motion.     Cervical back: Normal range of motion and neck supple.  Skin:    General: Skin is warm.     Findings: Rash present.  Neurological:     General: No focal deficit present.     Mental  Status: He is alert and oriented to person, place, and time.  Psychiatric:        Attention and Perception: Attention and perception normal.        Mood and Affect: Mood is depressed. Affect is labile.        Speech: Speech normal.        Behavior: Behavior is withdrawn.        Thought Content: Thought content includes suicidal ideation.        Cognition and Memory: Cognition and memory normal.        Judgment: Judgment normal.     Review of Systems  Blood pressure 117/89, pulse 68, temperature 98.1 F (36.7 C), temperature source Oral, resp. rate 17, height 6\' 1"  (1.854 m), weight 96.6 kg, SpO2 98 %.Body mass index is 28.1 kg/m.  General Appearance: Fairly Groomed  Eye Contact:  Fair  Speech:  Clear and Coherent  Volume:  Normal  Mood:  Depressed  Affect:  Congruent  Thought Process:  Coherent  Orientation:  Full (Time, Place, and Person)  Thought Content:  Logical  Suicidal Thoughts:  Yes.  without intent/plan  Homicidal Thoughts:  No  Memory:  Immediate;   Fair Recent;   Fair Remote;   Fair  Judgement:  Fair  Insight:  Fair  Psychomotor Activity:  Normal  Concentration:  Concentration: Fair and Attention Span: Fair  Recall:  [employer] of Knowledge:  Fair  Language:  Fair  Akathisia:  Negative  Handed:  Right  AIMS (if indicated):     Assets:  Communication Skills Desire for Improvement Physical Health Resilience  ADL's:  Intact  Cognition:  WNL  Sleep:        Treatment Plan Summary: Daily contact with patient to assess and evaluate symptoms and progress in treatment, Medication management and Plan continue medication as above. Will increase gabapentin to 600 mg TID for neuropathic pain. Consult medicine tomorrow for possible  hidradenitis suppurativa.  [name], MD 02/17/2020, 3:10 PM

## 2020-02-17 NOTE — Progress Notes (Signed)
PT Cancellation Note  Patient Details Name: Samuel Molina MRN: [id number] DOB: Nov 04, 1960   Cancelled Treatment:    Reason Eval/Treat Not Completed: Patient adamantly declined to participate with PT services in any way until he receives a more definitive diagnosis for his L hip pain.  Pt expressed frustration with having been given four different diagnoses for his hip pain over the last several weeks.  Will complete PT orders at this time but will reassess pt pending a change in status upon receipt of new PT orders.     [name] PT, DPT 02/17/20, 10:38 AM

## 2020-02-17 NOTE — Progress Notes (Signed)
Patient is cooperative with treatment. Patient endorses depression.  Denies SI,HI and AVH . Compliant with medications. Patient appears to be resting in the bed quietly.  Patient appears to be in bed resting quietly.

## 2020-02-17 NOTE — Progress Notes (Signed)
Recreation Therapy Notes  Date: 02/17/2020  Time: 9:30 am   Location: Craft room     Behavioral response: N/A   Intervention Topic: Stress Management   Discussion/Intervention: Patient did not attend group.   Clinical Observations/Feedback:  Patient did not attend group.   Jackline Castilla LRT/CTRS        Malavika Lira 02/17/2020 11:21 AM

## 2020-02-17 NOTE — Progress Notes (Signed)
D: Pt alert and oriented. Pt rates depression 10/10 and anxiety 10/10. Pt reports experiencing 10/10 left hip pain. Pt denies experiencing any SI/HI, or AVH at this time.   Pt refused to take naproxen once received from pharmacy. MD notified and aware. Pt did take this dose at lunch time and MD notified and aware and approved pt to take evening dose as scheduled.   A: Scheduled medications administered to pt, per MD orders. Support and encouragement provided. Frequent verbal contact made. Routine safety checks conducted q15 minutes.   R: No adverse drug reactions noted. Pt verbally contracts for safety at this time. Pt complaint with medications. Pt interacts well with others on the unit, however did spend most the first half of the day in his room. Pt remains safe at this time. Will continue to monitor.

## 2020-02-17 NOTE — BHH Suicide Risk Assessment (Signed)
[place] INPATIENT:  Family/Significant Other Suicide Prevention Education  Suicide Prevention Education:  Patient Refusal for Family/Significant Other Suicide Prevention Education: The patient Samuel Molina has refused to provide written consent for family/significant other to be provided Family/Significant Other Suicide Prevention Education during admission and/or prior to discharge.  Physician notified.  SPE completed with pt, as pt refused to consent to family contact. SPI pamphlet provided to pt and pt was encouraged to share information with support network, ask questions, and talk about any concerns relating to SPE. Pt denies access to guns/firearms and verbalized understanding of information provided. Mobile Crisis information also provided to pt.  [name] 02/17/2020, 3:00 PM

## 2020-02-18 DIAGNOSIS — F332 Major depressive disorder, recurrent severe without psychotic features: Secondary | ICD-10-CM | POA: Diagnosis not present

## 2020-02-18 MED ORDER — ADULT MULTIVITAMIN W/MINERALS CH
1.0000 | ORAL_TABLET | Freq: Every day | ORAL | Status: DC
  Administered 2020-02-18 – 2020-02-24 (×8): 1 via ORAL
  Filled 2020-02-18 (×8): qty 1

## 2020-02-18 MED ORDER — GABAPENTIN 400 MG PO CAPS
800.0000 mg | ORAL_CAPSULE | Freq: Three times a day (TID) | ORAL | Status: DC
  Administered 2020-02-18 – 2020-02-19 (×4): 800 mg via ORAL
  Filled 2020-02-18 (×4): qty 2

## 2020-02-18 NOTE — BHH Group Notes (Addendum)
[facility] Group Notes:  (Nursing/MHT/Case Management/Adjunct)  Date:  02/18/2020  Time:  10:33 PM  Type of Therapy:  Group Therapy  Participation Level:  Active  Participation Quality:  Appropriate  Affect:  Appropriate  Cognitive:  Alert  Insight:  Good  Engagement in Group:Said his name is Samuel Molina,he got upset today because it was time to get off the phone and he was on the phone talking, the person and him had words and he went to his room and cool off.  [name] 02/18/2020, 10:33 PM

## 2020-02-18 NOTE — Tx Team (Signed)
Interdisciplinary Treatment and Diagnostic Plan Update  02/18/2020 Time of Session: 9:00AM Samuel Molina MRN: [id number]  Principal Diagnosis: Severe recurrent major depression without psychotic features [facility])  Secondary Diagnoses: Principal Problem:   Severe recurrent major depression without psychotic features ([place]) Active Problems:   Gastroesophageal reflux disease with esophagitis   Cocaine abuse ([place])   Arthritis of knee   Personality disorder ([place])   Current Medications:  Current Facility-Administered Medications  Medication Dose Route Frequency Provider Last Rate Last Admin  . alum & mag hydroxide-simeth (MAALOX/MYLANTA) 200-200-20 MG/5ML suspension 30 mL  30 mL Oral Q4H PRN Clapacs, John T, MD      . alum & mag hydroxide-simeth (MAALOX/MYLANTA) 200-200-20 MG/5ML suspension 30 mL  30 mL Oral Q6H PRN Clapacs, John T, MD      . citalopram (CELEXA) tablet 10 mg  10 mg Oral Daily Clapacs, [name], MD   10 mg at 02/18/20 9798  . gabapentin (NEURONTIN) capsule 800 mg  800 mg Oral TID [name], MD      . magnesium hydroxide (MILK OF MAGNESIA) suspension 30 mL  30 mL Oral Daily PRN Clapacs, John T, MD      . multivitamin with minerals tablet 1 tablet  1 tablet Oral Daily [name], MD      . naproxen (NAPROSYN) tablet 375 mg  375 mg Oral BID WC [name], MD   375 mg at 02/18/20 9211  . ondansetron (ZOFRAN) tablet 4 mg  4 mg Oral Q8H PRN Clapacs, John T, MD      . pantoprazole (PROTONIX) EC tablet 80 mg  80 mg Oral Daily [name], MD   80 mg at 02/18/20 9417  . QUEtiapine (SEROQUEL) tablet 50 mg  50 mg Oral QHS [name], MD   50 mg at 02/17/20 2108  . traZODone (DESYREL) tablet 100 mg  100 mg Oral QHS PRN [name], MD   100 mg at 02/17/20 2108   PTA Medications: Medications Prior to Admission  Medication Sig Dispense Refill Last Dose  . citalopram (CELEXA) 10 MG tablet Take 1 tablet (10 mg total) by mouth daily. 30 tablet 1   .  gabapentin (NEURONTIN) 300 MG capsule Take 1 capsule (300 mg total) by mouth 3 (three) times daily. 90 capsule 1   . mirtazapine (REMERON) 30 MG tablet Take 1 tablet (30 mg total) by mouth at bedtime. 30 tablet 1   . naproxen (NAPROSYN) 500 MG tablet Take 1 tablet (500 mg total) by mouth 2 (two) times daily with a meal. 60 tablet 0   . pantoprazole (PROTONIX) 40 MG tablet Take 2 tablets (80 mg total) by mouth daily. 60 tablet 1     Patient Stressors: Financial difficulties Marital or family conflict Medication change or noncompliance  Patient Strengths: Ability for insight Average or above average intelligence Communication skills Work skills  Treatment Modalities: Medication Management, Group therapy, Case management,  1 to 1 session with clinician, Psychoeducation, Recreational therapy.   Physician Treatment Plan for Primary Diagnosis: Severe recurrent major depression without psychotic features ([place]) Long Term Goal(s): Improvement in symptoms so as ready for discharge Improvement in symptoms so as ready for discharge   Short Term Goals: Ability to identify changes in lifestyle to reduce recurrence of condition will improve Ability to verbalize feelings will improve Ability to disclose and discuss suicidal ideas Ability to demonstrate self-control will improve Ability to identify and develop effective coping behaviors will improve Compliance with prescribed  medications will improve Ability to identify triggers associated with substance abuse/mental health issues will improve Ability to identify changes in lifestyle to reduce recurrence of condition will improve Ability to verbalize feelings will improve Ability to disclose and discuss suicidal ideas Ability to demonstrate self-control will improve Ability to identify and develop effective coping behaviors will improve Compliance with prescribed medications will improve Ability to identify triggers associated with substance  abuse/mental health issues will improve  Medication Management: Evaluate patient's response, side effects, and tolerance of medication regimen.  Therapeutic Interventions: 1 to 1 sessions, Unit Group sessions and Medication administration.  Evaluation of Outcomes: Not Progressing  Physician Treatment Plan for Secondary Diagnosis: Principal Problem:   Severe recurrent major depression without psychotic features ([place]) Active Problems:   Gastroesophageal reflux disease with esophagitis   Cocaine abuse ([place])   Arthritis of knee   Personality disorder ([place])  Long Term Goal(s): Improvement in symptoms so as ready for discharge Improvement in symptoms so as ready for discharge   Short Term Goals: Ability to identify changes in lifestyle to reduce recurrence of condition will improve Ability to verbalize feelings will improve Ability to disclose and discuss suicidal ideas Ability to demonstrate self-control will improve Ability to identify and develop effective coping behaviors will improve Compliance with prescribed medications will improve Ability to identify triggers associated with substance abuse/mental health issues will improve Ability to identify changes in lifestyle to reduce recurrence of condition will improve Ability to verbalize feelings will improve Ability to disclose and discuss suicidal ideas Ability to demonstrate self-control will improve Ability to identify and develop effective coping behaviors will improve Compliance with prescribed medications will improve Ability to identify triggers associated with substance abuse/mental health issues will improve     Medication Management: Evaluate patient's response, side effects, and tolerance of medication regimen.  Therapeutic Interventions: 1 to 1 sessions, Unit Group sessions and Medication administration.  Evaluation of Outcomes: Not Progressing   RN Treatment Plan for Primary Diagnosis: Severe recurrent major  depression without psychotic features ([place]) Long Term Goal(s): Knowledge of disease and therapeutic regimen to maintain health will improve  Short Term Goals: Ability to remain free from injury will improve, Ability to verbalize frustration and anger appropriately will improve, Ability to demonstrate self-control, Ability to participate in decision making will improve, Ability to disclose and discuss suicidal ideas, Ability to identify and develop effective coping behaviors will improve and Compliance with prescribed medications will improve  Medication Management: RN will administer medications as ordered by provider, will assess and evaluate patient's response and provide education to patient for prescribed medication. RN will report any adverse and/or side effects to prescribing provider.  Therapeutic Interventions: 1 on 1 counseling sessions, Psychoeducation, Medication administration, Evaluate responses to treatment, Monitor vital signs and CBGs as ordered, Perform/monitor CIWA, COWS, AIMS and Fall Risk screenings as ordered, Perform wound care treatments as ordered.  Evaluation of Outcomes: Not Progressing   LCSW Treatment Plan for Primary Diagnosis: Severe recurrent major depression without psychotic features ([place]) Long Term Goal(s): Safe transition to appropriate next level of care at discharge, Engage patient in therapeutic group addressing interpersonal concerns.  Short Term Goals: Engage patient in aftercare planning with referrals and resources, Increase social support, Increase ability to appropriately verbalize feelings, Increase emotional regulation, Facilitate acceptance of mental health diagnosis and concerns, Facilitate patient progression through stages of change regarding substance use diagnoses and concerns, Identify triggers associated with mental health/substance abuse issues and Increase skills for wellness and recovery  Therapeutic Interventions:  Assess for all discharge  needs, 1 to 1 time with Social worker, Explore available resources and support systems, Assess for adequacy in community support network, Educate family and significant other(s) on suicide prevention, Complete Psychosocial Assessment, Interpersonal group therapy.  Evaluation of Outcomes: Not Progressing   Progress in Treatment: Attending groups: No. Participating in groups: No. Taking medication as prescribed: Yes. Toleration medication: Yes. Family/Significant other contact made: No, will contact:  pt declined collateral contact. Patient understands diagnosis: Yes. Discussing patient identified problems/goals with staff: Yes. Medical problems stabilized or resolved: Yes. Denies suicidal/homicidal ideation: Yes. Issues/concerns per patient self-inventory: No. Other: none  New problem(s) identified: No, Describe:  none  New Short Term/Long Term Goal(s):  detox, elimination of symptoms of psychosis, medication management for mood stabilization; elimination of SI thoughts; development of comprehensive mental wellness/sobriety plan.  Patient Goals:  "attitude, temper, patience"  Discharge Plan or Barriers:  Patient reports that he is open to referral for inpatient.     Reason for Continuation of Hospitalization: Aggression Anxiety Depression Medication stabilization Suicidal ideation Withdrawal symptoms  Estimated Length of Stay:  1-7 days  Attendees: Patient:  Samuel Molina 02/18/2020 11:00 AM  Physician: Dr. Domingo Cocking, MD 02/18/2020 11:00 AM  Nursing: Graceann Congress, RN 02/18/2020 11:00 AM  RN Care Manager: 02/18/2020 11:00 AM  Social Worker: Assunta Curtis, LCSW 02/18/2020 11:00 AM  Recreational Therapist: Roanna Epley, CTRS, LRT 02/18/2020 11:00 AM  Other: Michell Heinrich, LCSW 02/18/2020 11:00 AM  Other:  02/18/2020 11:00 AM  Other: 02/18/2020 11:00 AM    Scribe for Treatment Team: Rozann Lesches, LCSW 02/18/2020 11:00 AM

## 2020-02-18 NOTE — Progress Notes (Signed)
Samuel Molina Progress Note  02/18/2020 10:48 AM Samuel Molina  MRN:  [id number]   Subjective:  Samuel Molina seen in treatment team and one-on-one at bedside today. He states that he is feeling extremely depressed today. He also notes several mood swings with extreme anger and frustration. He denies homicidal ideations, visual hallucinations, or auditory hallucinations. He states he is going to try and stay positive by getting out of bed to shower, write poetry, and see the sunlight.  Yesterday, he showed me some marks under his armpits that looked similar to hidradenitis suppurativa, but spontaneously drained with hot shower and hot compresses. No signs of infection today. Will continue to monitor. He continues to have neuropathic pain along his left hip that is not resolving with increasing doses of gabapentin and naproxen. He was previously supposed to see a neurosurgeon for possible avascular necrosis of femur head. Will place orthopedic consult today to see if any further imaging or studies should be completed during hospital stay.   Principal Problem: Severe recurrent major depression without psychotic features ([place]) Diagnosis: Principal Problem:   Severe recurrent major depression without psychotic features ([place]) Active Problems:   Gastroesophageal reflux disease with esophagitis   Cocaine abuse ([place])   Arthritis of knee   Personality disorder ([place])  Total Time spent with patient: 30 minutes  Past Psychiatric History: Past history of at least 3 prior inpatient treatments here back to back. On admissions he was not really cooperative with much treatment in the hospital but demanded referral to inpatient rehab then left the hospital early when things did not happen quickly enough. No known prior suicide attempts prior to this most recent attempt.   Past Medical History:  Past Medical History:  Diagnosis Date  . Anxiety   . Depression   . GERD (gastroesophageal reflux disease)   .  Obesity   . Substance abuse [facility])     Past Surgical History:  Procedure Laterality Date  . ESOPHAGOGASTRODUODENOSCOPY (EGD) WITH PROPOFOL N/A 09/20/2017   Procedure: ESOPHAGOGASTRODUODENOSCOPY (EGD) WITH PROPOFOL;  Surgeon: Lin Landsman, Molina;  Location: [facility];  Service: Gastroenterology;  Laterality: N/A;  . KNEE SURGERY Right    Family History: History reviewed. No pertinent family history. Family Psychiatric  History: Denies Social History:  Social History   Substance and Sexual Activity  Alcohol Use Not Currently     Social History   Substance and Sexual Activity  Drug Use Yes  . Types: Cocaine    Social History   Socioeconomic History  . Marital status: Single    Spouse name: Not on file  . Number of children: Not on file  . Years of education: Not on file  . Highest education level: Not on file  Occupational History  . Not on file  Tobacco Use  . Smoking status: Current Every Day Smoker    Packs/day: 0.50    Types: Cigarettes  . Smokeless tobacco: Never Used  Vaping Use  . Vaping Use: Never used  Substance and Sexual Activity  . Alcohol use: Not Currently  . Drug use: Yes    Types: Cocaine  . Sexual activity: Not on file  Other Topics Concern  . Not on file  Social History Narrative  . Not on file   Social Determinants of Health   Financial Resource Strain:   . Difficulty of Paying Living Expenses: Not on file  Food Insecurity:   . Worried About Charity fundraiser in the Last Year: Not on file  .  Ran Out of Food in the Last Year: Not on file  Transportation Needs:   . Lack of Transportation (Medical): Not on file  . Lack of Transportation (Non-Medical): Not on file  Physical Activity:   . Days of Exercise per Week: Not on file  . Minutes of Exercise per Session: Not on file  Stress:   . Feeling of Stress : Not on file  Social Connections:   . Frequency of Communication with Friends and Family: Not on file  . Frequency of Social  Gatherings with Friends and Family: Not on file  . Attends Religious Services: Not on file  . Active Member of Clubs or Organizations: Not on file  . Attends Archivist Meetings: Not on file  . Marital Status: Not on file   Additional Social History:                         Sleep: Poor  Appetite:  Fair  Current Medications: Current Facility-Administered Medications  Medication Dose Route Frequency Provider Last Rate Last Admin  . alum & mag hydroxide-simeth (MAALOX/MYLANTA) 200-200-20 MG/5ML suspension 30 mL  30 mL Oral Q4H PRN Clapacs, John T, Molina      . alum & mag hydroxide-simeth (MAALOX/MYLANTA) 200-200-20 MG/5ML suspension 30 mL  30 mL Oral Q6H PRN Clapacs, John T, Molina      . citalopram (CELEXA) tablet 10 mg  10 mg Oral Daily Clapacs, [name], Molina   10 mg at 02/18/20 7026  . gabapentin (NEURONTIN) capsule 800 mg  800 mg Oral TID [name], Molina      . magnesium hydroxide (MILK OF MAGNESIA) suspension 30 mL  30 mL Oral Daily PRN Clapacs, John T, Molina      . multivitamin with minerals tablet 1 tablet  1 tablet Oral Daily [name], Molina      . naproxen (NAPROSYN) tablet 375 mg  375 mg Oral BID WC [name], Molina   375 mg at 02/18/20 3785  . ondansetron (ZOFRAN) tablet 4 mg  4 mg Oral Q8H PRN Clapacs, John T, Molina      . pantoprazole (PROTONIX) EC tablet 80 mg  80 mg Oral Daily [name], Molina   80 mg at 02/18/20 8850  . QUEtiapine (SEROQUEL) tablet 50 mg  50 mg Oral QHS [name], Molina   50 mg at 02/17/20 2108  . traZODone (DESYREL) tablet 100 mg  100 mg Oral QHS PRN [name], Molina   100 mg at 02/17/20 2108    Lab Results:  Results for orders placed or performed during the hospital encounter of 02/16/20 (from the past 48 hour(s))  Lipid panel     Status: Abnormal   Collection Time: 02/16/20  5:51 PM  Result Value Ref Range   Cholesterol 171 0 - 200 mg/dL   Triglycerides 330 (H) <150 mg/dL   HDL 36 (L) >40 mg/dL   Total CHOL/HDL Ratio  4.8 RATIO   VLDL 66 (H) 0 - 40 mg/dL   LDL Cholesterol 69 0 - 99 mg/dL    Comment:        Total Cholesterol/HDL:CHD Risk Coronary Heart Disease Risk Table                     Men   Women  1/2 Average Risk   3.4   3.3  Average Risk       5.0  4.4  2 X Average Risk   9.6   7.1  3 X Average Risk  23.4   11.0        Use the calculated Patient Ratio above and the CHD Risk Table to determine the patient's CHD Risk.        ATP III CLASSIFICATION (LDL):  <100     mg/dL   Optimal  100-129  mg/dL   Near or Above                    Optimal  130-159  mg/dL   Borderline  160-189  mg/dL   High  >190     mg/dL   Very High Performed at [facility], [address] [place], [place] [address]   Hemoglobin A1c     Status: Abnormal   Collection Time: 02/16/20  5:51 PM  Result Value Ref Range   Hgb A1c MFr Bld 5.7 (H) 4.8 - 5.6 %    Comment: (NOTE)         Prediabetes: 5.7 - 6.4         Diabetes: >6.4         Glycemic control for adults with diabetes: <7.0    Mean Plasma Glucose 117 mg/dL    Comment: (NOTE) Performed At: [facility] [place], [place] [id number] [address] Molina OV:[id number]     Blood Alcohol level:  Lab Results  Component Value Date   [facility] <10 02/14/2020   ETH <10 41/28/7867    Metabolic Disorder Labs: Lab Results  Component Value Date   HGBA1C 5.7 (H) 02/16/2020   MPG 117 02/16/2020   No results found for: PROLACTIN Lab Results  Component Value Date   CHOL 171 02/16/2020   TRIG 330 (H) 02/16/2020   HDL 36 (L) 02/16/2020   CHOLHDL 4.8 02/16/2020   VLDL 66 (H) 02/16/2020   LDLCALC 69 02/16/2020    Physical Findings: AIMS: Facial and Oral Movements Muscles of Facial Expression: None, normal Lips and Perioral Area: None, normal Jaw: None, normal Tongue: None, normal,Extremity Movements Upper (arms, wrists, hands, fingers): None, normal Lower (legs, knees, ankles, toes): None, normal, Trunk Movements Neck,  shoulders, hips: None, normal, Overall Severity Severity of abnormal movements (highest score from questions above): None, normal Incapacitation due to abnormal movements: None, normal Patient's awareness of abnormal movements (rate only patient's report): No Awareness, Dental Status Current problems with teeth and/or dentures?: No Does patient usually wear dentures?: No  CIWA:    COWS:     Musculoskeletal: Strength & Muscle Tone: within normal limits Gait & Station: normal Patient leans: N/A  Psychiatric Specialty Exam: Physical Exam Vitals and nursing note reviewed.  Constitutional:      Appearance: Normal appearance.  HENT:     Head: Normocephalic and atraumatic.     Right Ear: External ear normal.     Left Ear: External ear normal.     Nose: Nose normal.     Mouth/Throat:     Mouth: Mucous membranes are moist.     Pharynx: Oropharynx is clear.  Eyes:     Extraocular Movements: Extraocular movements intact.     Conjunctiva/sclera: Conjunctivae normal.     Pupils: Pupils are equal, round, and reactive to light.  Cardiovascular:     Rate and Rhythm: Normal rate.     Pulses: Normal pulses.  Pulmonary:     Effort: Pulmonary effort is normal.     Breath sounds: Normal breath sounds.  Abdominal:     General: Abdomen is flat.     Palpations: Abdomen is soft.  Musculoskeletal:        General: No swelling. Normal range of motion.     Cervical back: Normal range of motion and neck supple.  Skin:    General: Skin is warm and dry.     Findings: No rash.  Neurological:     General: No focal deficit present.     Mental Status: He is alert and oriented to person, place, and time.  Psychiatric:        Attention and Perception: Attention and perception normal.        Mood and Affect: Mood is depressed. Affect is labile.        Speech: Speech normal.        Behavior: Behavior is withdrawn.        Thought Content: Thought content includes suicidal ideation.        Cognition and  Memory: Cognition and memory normal.        Judgment: Judgment normal.     Review of Systems  Constitutional: Negative for appetite change and fatigue.  HENT: Negative for rhinorrhea and sore throat.   Eyes: Negative for photophobia and visual disturbance.  Respiratory: Negative for cough and shortness of breath.   Cardiovascular: Negative for chest pain and palpitations.  Gastrointestinal: Negative for constipation, diarrhea, nausea and vomiting.  Endocrine: Negative for cold intolerance and heat intolerance.  Genitourinary: Negative for difficulty urinating and dysuria.  Musculoskeletal: Positive for back pain and myalgias.  Skin: Negative for rash and wound.  Allergic/Immunologic: Negative for food allergies and immunocompromised state.  Neurological: Negative for dizziness and headaches.  Hematological: Negative for adenopathy. Does not bruise/bleed easily.  Psychiatric/Behavioral: Positive for dysphoric mood and suicidal ideas. Negative for hallucinations.    Blood pressure 132/78, pulse 72, temperature 98.5 F (36.9 C), temperature source Oral, resp. rate 17, height 6\' 1"  (1.854 m), weight 96.6 kg, SpO2 100 %.Body mass index is 28.1 kg/m.  General Appearance: Fairly Groomed  Eye Contact:  Fair  Speech:  Clear and Coherent  Volume:  Normal  Mood:  Depressed  Affect:  Congruent  Thought Process:  Coherent  Orientation:  Full (Time, Place, and Person)  Thought Content:  Logical  Suicidal Thoughts:  Yes.  without intent/plan  Homicidal Thoughts:  No  Memory:  Immediate;   Fair Recent;   Fair Remote;   Fair  Judgement:  Fair  Insight:  Fair  Psychomotor Activity:  Normal  Concentration:  Concentration: Fair and Attention Span: Fair  Recall:  [employer] of Knowledge:  Fair  Language:  Fair  Akathisia:  Negative  Handed:  Right  AIMS (if indicated):     Assets:  Communication Skills Desire for Improvement Physical Health Resilience  ADL's:  Intact  Cognition:  WNL   Sleep:  Number of Hours: 7     Treatment Plan Summary: Daily contact with patient to assess and evaluate symptoms and progress in treatment, Medication management and Plan continue medication as above. Will increase gabapentin to 800 mg TID for neuropathic pain. Consult orthopedic surgery for left hip pain. Will continue to monitor underarms as pustules appear to have spontaneously resolved with hot compresses. No fever or white count to suggest systemic infection.  [name], Molina 02/18/2020, 10:48 AM

## 2020-02-18 NOTE — Progress Notes (Signed)
Patient alert and oriented x 4 affect is blunted thoughts are organized and coherent he appears agitated and irritable not receptive to staff, he was complaining of not having milk even though staff explained it is temporary he would get some milk as soon as it is restocked, he was offered emotional support he was complaint with scheduled medication, 15 minutes safety checks maintained will continue to monitor.

## 2020-02-18 NOTE — Progress Notes (Signed)
Recreation Therapy Notes   Date: 02/18/2020  Time: 9:30 am   Location: Craft room   Behavioral response: Appropriate  Intervention Topic: Happiness   Discussion/Intervention:  Group content today was focused on Happiness. The group defined happiness and described where happiness comes from. Individuals identified what makes them happy and how they go about making others happy. Patients expressed things that stop them from being happy and ways they can improve their happiness. The group stated reasons why it is important to be happy. The group participated in the intervention "My Happiness", where they had a chance to identify and express things that make them happy.  Clinical Observations/Feedback: Patient came to group late due unknown reasons and expressed that he was allergic to paint, other alternatives were offer to patient by writer;patient declined. He socialized with peers and returned to his room. Kenisha Lynds LRT/CTRS          Mcdonald Reiling 02/18/2020 1:33 PM

## 2020-02-18 NOTE — Progress Notes (Addendum)
D: Pt alert and oriented. Pt denies experiencing any anxiety/depression at this time. Pt reports experiencing 10/10 left hip pain, scheduled meds given, MD notified and new orders placed. Pt denies experiencing any SI/HI, or AVH at this time.   BP was elevated this morning but has since been rechecked and come down. MD was notified and aware of pt's BP.  A: Scheduled medications administered to pt, per MD orders. Support and encouragement provided. Frequent verbal contact made. Routine safety checks conducted q15 minutes.   R: No adverse drug reactions noted. Pt verbally contracts for safety at this time. Pt complaint with medications and treatment plan. Pt interacts well with others on the unit. Pt remains safe at this time. Will continue to monitor.

## 2020-02-18 NOTE — [facility] Group Notes (Signed)
LCSW Group Therapy Note  02/18/2020 4:13 PM  Type of Therapy/Topic:  Group Therapy:  Emotion Regulation  Participation Level:  Did Not Attend   Description of Group:   The purpose of this group is to assist patients in learning to regulate negative emotions and experience positive emotions. Patients will be guided to discuss ways in which they have been vulnerable to their negative emotions. These vulnerabilities will be juxtaposed with experiences of positive emotions or situations, and patients will be challenged to use positive emotions to combat negative ones. Special emphasis will be placed on coping with negative emotions in conflict situations, and patients will process healthy conflict resolution skills.  Therapeutic Goals: 1. Patient will identify two positive emotions or experiences to reflect on in order to balance out negative emotions 2. Patient will label two or more emotions that they find the most difficult to experience 3. Patient will demonstrate positive conflict resolution skills through discussion and/or role plays  Summary of Patient Progress: Patient walked in the room to ask what the class was about and then stated that he was not going to participate and walked out.    Therapeutic Modalities:   Cognitive Behavioral Therapy Feelings Identification Dialectical Behavioral Therapy  Chalmers Guest. Guerry Bruin, MSW, [place], [place] 02/18/2020 4:13 PM

## 2020-02-19 DIAGNOSIS — F332 Major depressive disorder, recurrent severe without psychotic features: Secondary | ICD-10-CM | POA: Diagnosis not present

## 2020-02-19 MED ORDER — QUETIAPINE FUMARATE 100 MG PO TABS
100.0000 mg | ORAL_TABLET | Freq: Every day | ORAL | Status: DC
  Administered 2020-02-19: 100 mg via ORAL
  Filled 2020-02-19: qty 1

## 2020-02-19 MED ORDER — KETOROLAC TROMETHAMINE 10 MG PO TABS
10.0000 mg | ORAL_TABLET | Freq: Three times a day (TID) | ORAL | Status: DC
  Administered 2020-02-19 – 2020-02-22 (×7): 10 mg via ORAL
  Filled 2020-02-19 (×13): qty 1

## 2020-02-19 NOTE — [facility] Group Notes (Signed)
LCSW Group Therapy Note  02/19/2020 3:01 PM  Type of Therapy/Topic:  Group Therapy:  Balance in Life  Participation Level:  Active  Description of Group:    This group will address the concept of balance and how it feels and looks when one is unbalanced. Patients will be encouraged to process areas in their lives that are out of balance and identify reasons for remaining unbalanced. Facilitators will guide patients in utilizing problem-solving interventions to address and correct the stressor making their life unbalanced. Understanding and applying boundaries will be explored and addressed for obtaining and maintaining a balanced life. Patients will be encouraged to explore ways to assertively make their unbalanced needs known to significant others in their lives, using other group members and facilitator for support and feedback.  Therapeutic Goals: 1. Patient will identify two or more emotions or situations they have that consume much of in their lives. 2. Patient will identify signs/triggers that life has become out of balance:  3. Patient will identify two ways to set boundaries in order to achieve balance in their lives:  4. Patient will demonstrate ability to communicate their needs through discussion and/or role plays  Summary of Patient Progress: Patient was present and active in group. He shared that he has worked to keep balance.  He reports that balance in not important when it comes to nutrition, he reports that he eats what makes him happy. Patient was supportive of other group members.     Therapeutic Modalities:   Cognitive Behavioral Therapy Solution-Focused Therapy Assertiveness Training  Assunta Curtis MSW, LCSW 02/19/2020 3:01 PM

## 2020-02-19 NOTE — Progress Notes (Signed)
Patient alert and oriented x 4 affect is blunted thoughts are organized and coherent he appears less irritable, not argumentative with staff, he was receptive to staff, he was given medication for left hip pain. Patients was offered  emotional support and encouragement, 15 minutes safety checks maintained will continue to monitor

## 2020-02-19 NOTE — Progress Notes (Signed)
[facility] MD Progress Note  02/19/2020 12:32 PM Samuel Molina  MRN:  [id number]   Subjective:  Samuel Molina seenone-on-one at bedside today. He states that he is feeling extremely depressed today. He also notes several mood swings with extreme anger and frustration. He denies homicidal ideations, visual hallucinations, or auditory hallucinations. He continues to have neuropathic pain along his left quad with left shin numbness. Pain has not improved with gabapentin and naproxen, and he wishes to discontinue these medications. Will schedule Toradol to see if this assists with pain. He feels he has been able to more easily control his temper with Seroquel, and is agreeable to trying a higher dose to see if it will further assist with depression, anxiety, and irritability.   Principal Problem: Severe recurrent major depression without psychotic features ([place]) Diagnosis: Principal Problem:   Severe recurrent major depression without psychotic features ([place]) Active Problems:   Gastroesophageal reflux disease with esophagitis   Cocaine abuse ([place])   Arthritis of knee   Personality disorder ([place])  Total Time spent with patient: 30 minutes  Past Psychiatric History: Past history of at least 3 prior inpatient treatments here back to back. On admissions he was not really cooperative with much treatment in the hospital but demanded referral to inpatient rehab then left the hospital early when things did not happen quickly enough. No known prior suicide attempts prior to this most recent attempt.   Past Medical History:  Past Medical History:  Diagnosis Date  . Anxiety   . Depression   . GERD (gastroesophageal reflux disease)   . Obesity   . Substance abuse [facility])     Past Surgical History:  Procedure Laterality Date  . ESOPHAGOGASTRODUODENOSCOPY (EGD) WITH PROPOFOL N/A 09/20/2017   Procedure: ESOPHAGOGASTRODUODENOSCOPY (EGD) WITH PROPOFOL;  Surgeon: Lin Landsman, MD;  Location: [facility];  Service: Gastroenterology;  Laterality: N/A;  . KNEE SURGERY Right    Family History: History reviewed. No pertinent family history. Family Psychiatric  History: Denies Social History:  Social History   Substance and Sexual Activity  Alcohol Use Not Currently     Social History   Substance and Sexual Activity  Drug Use Yes  . Types: Cocaine    Social History   Socioeconomic History  . Marital status: Single    Spouse name: Not on file  . Number of children: Not on file  . Years of education: Not on file  . Highest education level: Not on file  Occupational History  . Not on file  Tobacco Use  . Smoking status: Current Every Day Smoker    Packs/day: 0.50    Types: Cigarettes  . Smokeless tobacco: Never Used  Vaping Use  . Vaping Use: Never used  Substance and Sexual Activity  . Alcohol use: Not Currently  . Drug use: Yes    Types: Cocaine  . Sexual activity: Not on file  Other Topics Concern  . Not on file  Social History Narrative  . Not on file   Social Determinants of Health   Financial Resource Strain:   . Difficulty of Paying Living Expenses: Not on file  Food Insecurity:   . Worried About Charity fundraiser in the Last Year: Not on file  . Ran Out of Food in the Last Year: Not on file  Transportation Needs:   . Lack of Transportation (Medical): Not on file  . Lack of Transportation (Non-Medical): Not on file  Physical Activity:   . Days of Exercise per  Week: Not on file  . Minutes of Exercise per Session: Not on file  Stress:   . Feeling of Stress : Not on file  Social Connections:   . Frequency of Communication with Friends and Family: Not on file  . Frequency of Social Gatherings with Friends and Family: Not on file  . Attends Religious Services: Not on file  . Active Member of Clubs or Organizations: Not on file  . Attends Archivist Meetings: Not on file  . Marital Status: Not on file   Additional Social History:                          Sleep: Poor  Appetite:  Fair  Current Medications: Current Facility-Administered Medications  Medication Dose Route Frequency Provider Last Rate Last Admin  . alum & mag hydroxide-simeth (MAALOX/MYLANTA) 200-200-20 MG/5ML suspension 30 mL  30 mL Oral Q4H PRN Clapacs, John T, MD      . alum & mag hydroxide-simeth (MAALOX/MYLANTA) 200-200-20 MG/5ML suspension 30 mL  30 mL Oral Q6H PRN Clapacs, John T, MD      . citalopram (CELEXA) tablet 10 mg  10 mg Oral Daily Clapacs, [name], MD   10 mg at 02/19/20 0813  . ketorolac (TORADOL) tablet 10 mg  10 mg Oral Q8H [name], MD      . magnesium hydroxide (MILK OF MAGNESIA) suspension 30 mL  30 mL Oral Daily PRN Clapacs, [name], MD      . multivitamin with minerals tablet 1 tablet  1 tablet Oral Daily [name], MD   1 tablet at 02/19/20 0815  . ondansetron (ZOFRAN) tablet 4 mg  4 mg Oral Q8H PRN Clapacs, John T, MD      . pantoprazole (PROTONIX) EC tablet 80 mg  80 mg Oral Daily [name], MD   80 mg at 02/19/20 0815  . QUEtiapine (SEROQUEL) tablet 100 mg  100 mg Oral QHS [name], MD      . traZODone (DESYREL) tablet 100 mg  100 mg Oral QHS PRN [name], MD   100 mg at 02/17/20 2108    Lab Results:  No results found for this or any previous visit (from the past 48 hour(s)).  Blood Alcohol level:  Lab Results  Component Value Date   ETH <10 02/14/2020   ETH <10 00/86/7619    Metabolic Disorder Labs: Lab Results  Component Value Date   HGBA1C 5.7 (H) 02/16/2020   MPG 117 02/16/2020   No results found for: PROLACTIN Lab Results  Component Value Date   CHOL 171 02/16/2020   TRIG 330 (H) 02/16/2020   HDL 36 (L) 02/16/2020   CHOLHDL 4.8 02/16/2020   VLDL 66 (H) 02/16/2020   LDLCALC 69 02/16/2020    Physical Findings: AIMS: Facial and Oral Movements Muscles of Facial Expression: None, normal Lips and Perioral Area: None, normal Jaw: None, normal Tongue: None,  normal,Extremity Movements Upper (arms, wrists, hands, fingers): None, normal Lower (legs, knees, ankles, toes): None, normal, Trunk Movements Neck, shoulders, hips: None, normal, Overall Severity Severity of abnormal movements (highest score from questions above): None, normal Incapacitation due to abnormal movements: None, normal Patient's awareness of abnormal movements (rate only patient's report): No Awareness, Dental Status Current problems with teeth and/or dentures?: No Does patient usually wear dentures?: No  CIWA:    COWS:     Musculoskeletal: Strength & Muscle Tone: within normal limits  Gait & Station: normal Patient leans: N/A  Psychiatric Specialty Exam: Physical Exam Vitals and nursing note reviewed.  Constitutional:      Appearance: Normal appearance.  HENT:     Head: Normocephalic and atraumatic.     Right Ear: External ear normal.     Left Ear: External ear normal.     Nose: Nose normal.     Mouth/Throat:     Mouth: Mucous membranes are moist.     Pharynx: Oropharynx is clear.  Eyes:     Extraocular Movements: Extraocular movements intact.     Conjunctiva/sclera: Conjunctivae normal.     Pupils: Pupils are equal, round, and reactive to light.  Cardiovascular:     Rate and Rhythm: Normal rate.     Pulses: Normal pulses.  Pulmonary:     Effort: Pulmonary effort is normal.     Breath sounds: Normal breath sounds.  Abdominal:     General: Abdomen is flat.     Palpations: Abdomen is soft.  Musculoskeletal:        General: No swelling. Normal range of motion.     Cervical back: Normal range of motion and neck supple.  Skin:    General: Skin is warm and dry.     Findings: No rash.  Neurological:     General: No focal deficit present.     Mental Status: He is alert and oriented to person, place, and time.  Psychiatric:        Attention and Perception: Attention and perception normal.        Mood and Affect: Mood is depressed. Affect is labile.         Speech: Speech normal.        Behavior: Behavior is withdrawn.        Thought Content: Thought content includes suicidal ideation.        Cognition and Memory: Cognition and memory normal.        Judgment: Judgment normal.     Review of Systems  Constitutional: Negative for appetite change and fatigue.  HENT: Negative for rhinorrhea and sore throat.   Eyes: Negative for photophobia and visual disturbance.  Respiratory: Negative for cough and shortness of breath.   Cardiovascular: Negative for chest pain and palpitations.  Gastrointestinal: Negative for constipation, diarrhea, nausea and vomiting.  Endocrine: Negative for cold intolerance and heat intolerance.  Genitourinary: Negative for difficulty urinating and dysuria.  Musculoskeletal: Positive for back pain and myalgias.  Skin: Negative for rash and wound.  Allergic/Immunologic: Negative for food allergies and immunocompromised state.  Neurological: Negative for dizziness and headaches.  Hematological: Negative for adenopathy. Does not bruise/bleed easily.  Psychiatric/Behavioral: Positive for dysphoric mood and suicidal ideas. Negative for hallucinations.    Blood pressure 130/81, pulse 63, temperature 97.8 F (36.6 C), temperature source Oral, resp. rate 18, height 6\' 1"  (1.854 m), weight 96.6 kg, SpO2 100 %.Body mass index is 28.1 kg/m.  General Appearance: Fairly Groomed  Eye Contact:  Fair  Speech:  Clear and Coherent  Volume:  Normal  Mood:  Depressed  Affect:  Congruent  Thought Process:  Coherent  Orientation:  Full (Time, Place, and Person)  Thought Content:  Logical  Suicidal Thoughts:  Yes.  without intent/plan  Homicidal Thoughts:  No  Memory:  Immediate;   Fair Recent;   Fair Remote;   Fair  Judgement:  Fair  Insight:  Fair  Psychomotor Activity:  Normal  Concentration:  Concentration: Fair and Attention Span: Fair  Recall:  [place] of Knowledge:  Fair  Language:  Fair  Akathisia:  Negative  Handed:   Right  AIMS (if indicated):     Assets:  Communication Skills Desire for Improvement Physical Health Resilience  ADL's:  Intact  Cognition:  WNL  Sleep:  Number of Hours: 5.5     Treatment Plan Summary: Daily contact with patient to assess and evaluate symptoms and progress in treatment, Medication management and Plan continue medication as above. Will increase Seroquel to 100 mg QHS for mood. Discontinue gabapentin and naproxen per patient request. Will try Toradol for pain control. Consult orthopedic surgery for left hip pain. Will continue to monitor underarms as pustules appear to have spontaneously resolved with hot compresses. No fever or white count to suggest systemic infection.  [name], MD 02/19/2020, 12:32 PM

## 2020-02-19 NOTE — Progress Notes (Signed)
BRIEF PHARMACY NOTE   This patient attended and participated in Medication Management Group counseling led by [facility] staff pharmacist.  This interactive class reviews basic information about prescription medications and education on personal responsibility in medication management.  The class also includes general knowledge of 3 main classes of behavioral medications, including antipsychotics, antidepressants, and mood stabilizers.     Patient behavior was appropriate for group setting.  Patient stayed for about 1/2-3/4 of group.  Educational materials sourced from:  "Medication Do's and Don'ts" from [email or web address]   "Mental Health Medications" from [email or web address] [place], PharmD, BCPS Clinical Pharmacist 02/19/2020 3:25 PM

## 2020-02-19 NOTE — Progress Notes (Signed)
Recreation Therapy Notes   Date: 02/19/2020  Time: 9:30 am   Location: Craft room     Behavioral response: N/A   Intervention Topic: Communication    Discussion/Intervention: Patient did not attend group.   Clinical Observations/Feedback:  Patient did not attend group.   Kymora Sciara LRT/CTRS        Keahi Mccarney 02/19/2020 11:31 AM

## 2020-02-19 NOTE — Plan of Care (Signed)
Patient stated that he had some argument and irritability with one of MHT this morning.Patient states at this time " I am learning thing now.I can just ignore some things." Patient stated that his anxiety and depression is getting "little better." Patient denies SI,HI and AVH.Compliant with medications.Attended groups.Support and encouragement given.

## 2020-02-20 DIAGNOSIS — F332 Major depressive disorder, recurrent severe without psychotic features: Secondary | ICD-10-CM | POA: Diagnosis not present

## 2020-02-20 MED ORDER — QUETIAPINE FUMARATE 200 MG PO TABS
200.0000 mg | ORAL_TABLET | Freq: Every day | ORAL | Status: DC
  Administered 2020-02-20 – 2020-02-21 (×2): 200 mg via ORAL
  Filled 2020-02-20 (×2): qty 1

## 2020-02-20 NOTE — Progress Notes (Signed)
[facility] MD Progress Note  02/20/2020 12:36 PM Samuel Molina  MRN:  [id number]   Subjective:  Samuel Molina seenone-on-one at bedside today. He states that he had lashed out verbally at staff overnight. However, was able to eventually walk away. He feels the seroquel has been able to give him the time he needs to utilize anger management coping strategies, and requests increase today. He denies suicidal ideations,  homicidal ideations, visual hallucinations, or auditory hallucinations. He continues to have neuropathic pain along his left quad with left shin numbness. Pain has not improved with toradol,  gabapentin or naproxen. Spoke with orthopedic surgeon on call who recommended an MRI of the hip to rule out infection.  Principal Problem: Severe recurrent major depression without psychotic features ([place]) Diagnosis: Principal Problem:   Severe recurrent major depression without psychotic features ([place]) Active Problems:   Gastroesophageal reflux disease with esophagitis   Cocaine abuse ([place])   Arthritis of knee   Personality disorder ([place])  Total Time spent with patient: 30 minutes  Past Psychiatric History: Past history of at least 3 prior inpatient treatments here back to back. On admissions he was not really cooperative with much treatment in the hospital but demanded referral to inpatient rehab then left the hospital early when things did not happen quickly enough. No known prior suicide attempts prior to this most recent attempt.   Past Medical History:  Past Medical History:  Diagnosis Date  . Anxiety   . Depression   . GERD (gastroesophageal reflux disease)   . Obesity   . Substance abuse [facility])     Past Surgical History:  Procedure Laterality Date  . ESOPHAGOGASTRODUODENOSCOPY (EGD) WITH PROPOFOL N/A 09/20/2017   Procedure: ESOPHAGOGASTRODUODENOSCOPY (EGD) WITH PROPOFOL;  Surgeon: Lin Landsman, MD;  Location: [place];  Service: Gastroenterology;  Laterality: N/A;  .  KNEE SURGERY Right    Family History: History reviewed. No pertinent family history. Family Psychiatric  History: Denies Social History:  Social History   Substance and Sexual Activity  Alcohol Use Not Currently     Social History   Substance and Sexual Activity  Drug Use Yes  . Types: Cocaine    Social History   Socioeconomic History  . Marital status: Single    Spouse name: Not on file  . Number of children: Not on file  . Years of education: Not on file  . Highest education level: Not on file  Occupational History  . Not on file  Tobacco Use  . Smoking status: Current Every Day Smoker    Packs/day: 0.50    Types: Cigarettes  . Smokeless tobacco: Never Used  Vaping Use  . Vaping Use: Never used  Substance and Sexual Activity  . Alcohol use: Not Currently  . Drug use: Yes    Types: Cocaine  . Sexual activity: Not on file  Other Topics Concern  . Not on file  Social History Narrative  . Not on file   Social Determinants of Health   Financial Resource Strain:   . Difficulty of Paying Living Expenses: Not on file  Food Insecurity:   . Worried About Charity fundraiser in the Last Year: Not on file  . Ran Out of Food in the Last Year: Not on file  Transportation Needs:   . Lack of Transportation (Medical): Not on file  . Lack of Transportation (Non-Medical): Not on file  Physical Activity:   . Days of Exercise per Week: Not on file  . Minutes of  Exercise per Session: Not on file  Stress:   . Feeling of Stress : Not on file  Social Connections:   . Frequency of Communication with Friends and Family: Not on file  . Frequency of Social Gatherings with Friends and Family: Not on file  . Attends Religious Services: Not on file  . Active Member of Clubs or Organizations: Not on file  . Attends Archivist Meetings: Not on file  . Marital Status: Not on file   Additional Social History:                         Sleep: Poor  Appetite:   Fair  Current Medications: Current Facility-Administered Medications  Medication Dose Route Frequency Provider Last Rate Last Admin  . alum & mag hydroxide-simeth (MAALOX/MYLANTA) 200-200-20 MG/5ML suspension 30 mL  30 mL Oral Q4H PRN Clapacs, John T, MD      . alum & mag hydroxide-simeth (MAALOX/MYLANTA) 200-200-20 MG/5ML suspension 30 mL  30 mL Oral Q6H PRN Clapacs, John T, MD      . citalopram (CELEXA) tablet 10 mg  10 mg Oral Daily Clapacs, [name], MD   10 mg at 02/20/20 1225  . ketorolac (TORADOL) tablet 10 mg  10 mg Oral Q8H [name], MD   10 mg at 02/19/20 2128  . magnesium hydroxide (MILK OF MAGNESIA) suspension 30 mL  30 mL Oral Daily PRN Clapacs, John T, MD      . multivitamin with minerals tablet 1 tablet  1 tablet Oral Daily [name], MD   1 tablet at 02/20/20 1224  . ondansetron (ZOFRAN) tablet 4 mg  4 mg Oral Q8H PRN Clapacs, John T, MD      . pantoprazole (PROTONIX) EC tablet 80 mg  80 mg Oral Daily [name], MD   80 mg at 02/20/20 1225  . QUEtiapine (SEROQUEL) tablet 100 mg  100 mg Oral QHS [name], MD   100 mg at 02/19/20 2128  . traZODone (DESYREL) tablet 100 mg  100 mg Oral QHS PRN [name], MD   100 mg at 02/19/20 2128    Lab Results:  No results found for this or any previous visit (from the past 48 hour(s)).  Blood Alcohol level:  Lab Results  Component Value Date   ETH <10 02/14/2020   ETH <10 95/18/8416    Metabolic Disorder Labs: Lab Results  Component Value Date   HGBA1C 5.7 (H) 02/16/2020   MPG 117 02/16/2020   No results found for: PROLACTIN Lab Results  Component Value Date   CHOL 171 02/16/2020   TRIG 330 (H) 02/16/2020   HDL 36 (L) 02/16/2020   CHOLHDL 4.8 02/16/2020   VLDL 66 (H) 02/16/2020   LDLCALC 69 02/16/2020    Physical Findings: AIMS: Facial and Oral Movements Muscles of Facial Expression: None, normal Lips and Perioral Area: None, normal Jaw: None, normal Tongue: None, normal,Extremity  Movements Upper (arms, wrists, hands, fingers): None, normal Lower (legs, knees, ankles, toes): None, normal, Trunk Movements Neck, shoulders, hips: None, normal, Overall Severity Severity of abnormal movements (highest score from questions above): None, normal Incapacitation due to abnormal movements: None, normal Patient's awareness of abnormal movements (rate only patient's report): No Awareness, Dental Status Current problems with teeth and/or dentures?: No Does patient usually wear dentures?: No  CIWA:    COWS:     Musculoskeletal: Strength & Muscle Tone: within normal limits Gait &  Station: normal Patient leans: N/A  Psychiatric Specialty Exam: Physical Exam Vitals and nursing note reviewed.  Constitutional:      Appearance: Normal appearance.  HENT:     Head: Normocephalic and atraumatic.     Right Ear: External ear normal.     Left Ear: External ear normal.     Nose: Nose normal.     Mouth/Throat:     Mouth: Mucous membranes are moist.     Pharynx: Oropharynx is clear.  Eyes:     Extraocular Movements: Extraocular movements intact.     Conjunctiva/sclera: Conjunctivae normal.     Pupils: Pupils are equal, round, and reactive to light.  Cardiovascular:     Rate and Rhythm: Normal rate.     Pulses: Normal pulses.  Pulmonary:     Effort: Pulmonary effort is normal.     Breath sounds: Normal breath sounds.  Abdominal:     General: Abdomen is flat.     Palpations: Abdomen is soft.  Musculoskeletal:        General: No swelling. Normal range of motion.     Cervical back: Normal range of motion and neck supple.  Skin:    General: Skin is warm and dry.     Findings: No rash.  Neurological:     General: No focal deficit present.     Mental Status: He is alert and oriented to person, place, and time.  Psychiatric:        Attention and Perception: Attention and perception normal.        Mood and Affect: Mood is depressed. Affect is labile.        Speech: Speech  normal.        Behavior: Behavior is withdrawn.        Thought Content: Thought content does not include suicidal ideation.        Cognition and Memory: Cognition and memory normal.        Judgment: Judgment is impulsive.     Review of Systems  Constitutional: Negative for appetite change and fatigue.  HENT: Negative for rhinorrhea and sore throat.   Eyes: Negative for photophobia and visual disturbance.  Respiratory: Negative for cough and shortness of breath.   Cardiovascular: Negative for chest pain and palpitations.  Gastrointestinal: Negative for constipation, diarrhea, nausea and vomiting.  Endocrine: Negative for cold intolerance and heat intolerance.  Genitourinary: Negative for difficulty urinating and dysuria.  Musculoskeletal: Positive for back pain and myalgias.  Skin: Negative for rash and wound.  Allergic/Immunologic: Negative for food allergies and immunocompromised state.  Neurological: Positive for numbness. Negative for dizziness and headaches.  Hematological: Negative for adenopathy. Does not bruise/bleed easily.  Psychiatric/Behavioral: Positive for dysphoric mood. Negative for hallucinations and suicidal ideas.    Blood pressure 120/81, pulse 73, temperature 97.6 F (36.4 C), temperature source Oral, resp. rate 18, height 6\' 1"  (1.854 m), weight 96.6 kg, SpO2 100 %.Body mass index is 28.1 kg/m.  General Appearance: Fairly Groomed  Eye Contact:  Fair  Speech:  Clear and Coherent  Volume:  Normal  Mood:  Depressed  Affect:  Congruent  Thought Process:  Coherent  Orientation:  Full (Time, Place, and Person)  Thought Content:  Logical  Suicidal Thoughts:  No  Homicidal Thoughts:  No  Memory:  Immediate;   Fair Recent;   Fair Remote;   Fair  Judgement:  Fair  Insight:  Fair  Psychomotor Activity:  Normal  Concentration:  Concentration: Fair and Attention Span: Fair  Recall:  [place] of Knowledge:  Fair  Language:  Fair  Akathisia:  Negative  Handed:   Right  AIMS (if indicated):     Assets:  Communication Skills Desire for Improvement Physical Health Resilience  ADL's:  Intact  Cognition:  WNL  Sleep:  Number of Hours: 5.5     Treatment Plan Summary: Daily contact with patient to assess and evaluate symptoms and progress in treatment, Medication management and Plan continue medication as above. Will increase Seroquel to 200 mg QHS for mood. Continue Toradol for pain control. Phone call to orthopedic surgery completed today, and they recommend MRI to rule out infection in the hip joint.   [name], MD 02/20/2020, 12:36 PM

## 2020-02-20 NOTE — [facility] Counselor (Signed)
CSW faxed referral to [facility].  CSW received confirmation of receipt.   Assunta Curtis, MSW, LCSW 02/20/2020 3:36 PM

## 2020-02-20 NOTE — [facility] Group Notes (Signed)
LCSW Group Therapy Note  02/20/2020 3:46 PM  Type of Therapy and Topic:  Group Therapy:  Feelings around Relapse and Recovery  Participation Level:  Active   Description of Group:    Patients in this group will discuss emotions they experience before and after a relapse. They will process how experiencing these feelings, or avoidance of experiencing them, relates to having a relapse. Facilitator will guide patients to explore emotions they have related to recovery. Patients will be encouraged to process which emotions are more powerful. They will be guided to discuss the emotional reaction significant others in their lives may have to their relapse or recovery. Patients will be assisted in exploring ways to respond to the emotions of others without this contributing to a relapse.  Therapeutic Goals: 1. Patient will identify two or more emotions that lead to a relapse for them 2. Patient will identify two emotions that result when they relapse 3. Patient will identify two emotions related to recovery 4. Patient will demonstrate ability to communicate their needs through discussion and/or role plays   Summary of Patient Progress: Pt was active in the discussion and gave lots of feedback regarding his struggles with relapse, feelings around it, dealing with people who do not want to see him being successful. Pt was also able to speak to the importance of support system development.   Therapeutic Modalities:   Cognitive Behavioral Therapy Solution-Focused Therapy Assertiveness Training Relapse Prevention Therapy   Hedy Camara R. Guerry Bruin, MSW, [place], [place] 02/20/2020 3:46 PM

## 2020-02-20 NOTE — Progress Notes (Signed)
MRI called and requested information related to pt screening for MRI. Pt denies having any metal implants, or otherwise any metal in his body or on his body in form or jewelry or ankle bracelet, or any form. Per MRI they are backed up and will call when they are ready to receive him, reporting it may be after shift change.

## 2020-02-20 NOTE — Progress Notes (Signed)
Recreation Therapy Notes   Date: 02/20/2020  Time: 9:30 am   Location: Craft room     Behavioral response: N/A   Intervention Topic: Self-care   Discussion/Intervention: Patient did not attend group.   Clinical Observations/Feedback:  Patient did not attend group.   Rayne Loiseau LRT/CTRS        Kalub Morillo 02/20/2020 1:09 PM

## 2020-02-20 NOTE — Progress Notes (Addendum)
Pt is alert and oriented to person, place, time and situation. Pt is angry, easily irritable, makes poor eye contact, loud and disruptive in the medication room, uncooperative, refused medications, MD/psychiatrist on unit aware. Pt denies suicidal and homicidal ideation, denies hallucinations. When staff attempted to check on pt in his room, pt states, "Go away!" Pt was out for breakfast, otherwise noted to be isolating in his room, resting awake and quietly in bed. Will continue to monitor pt per Q15 minute face checks and monitor for safety and progress.   Pt spoke with nursing staff later in the afternoon and apologized for being irritable earlier and agreed to take his AM meds that he refused. Mood has improved. Will continue to monitor pt.

## 2020-02-21 ENCOUNTER — Inpatient Hospital Stay: Payer: No Typology Code available for payment source

## 2020-02-21 DIAGNOSIS — F332 Major depressive disorder, recurrent severe without psychotic features: Principal | ICD-10-CM

## 2020-02-21 MED ORDER — TRAZODONE HCL 100 MG PO TABS
200.0000 mg | ORAL_TABLET | Freq: Every evening | ORAL | Status: DC | PRN
  Administered 2020-02-22: 200 mg via ORAL
  Filled 2020-02-21 (×2): qty 2

## 2020-02-21 MED ORDER — GADOBUTROL 1 MMOL/ML IV SOLN: 8.0000 mL | Freq: Once | INTRAVENOUS | Status: DC | PRN

## 2020-02-21 MED ORDER — HYDROXYZINE HCL 50 MG PO TABS
50.0000 mg | ORAL_TABLET | Freq: Once | ORAL | Status: AC
  Administered 2020-02-21: 50 mg via ORAL
  Filled 2020-02-21: qty 1

## 2020-02-21 MED ORDER — TRAZODONE HCL 100 MG PO TABS
100.0000 mg | ORAL_TABLET | Freq: Once | ORAL | Status: DC
  Filled 2020-02-21: qty 1

## 2020-02-21 MED ORDER — GADOBUTROL 1 MMOL/ML IV SOLN
7.5000 mL | Freq: Once | INTRAVENOUS | Status: AC | PRN
  Administered 2020-02-21: 7.5 mL via INTRAVENOUS

## 2020-02-21 NOTE — Progress Notes (Signed)
Pt is alert and oriented to person, place, time and situation. Pt is calm, cooperative, and denies suicidal and homicidal ideation. Pt is social with peers, spends time in the dayroom watching tv and talking. Pt is medication compliant, voices no complaints. No distress noted. Will continue to monitor pt per Q15 minute face checks and monitor for safety and progress.

## 2020-02-21 NOTE — [facility] Group Notes (Signed)
  [facility] LCSW Group Therapy Note  Date/Time:  02/21/2020 1:00 PM- 2:10 PM   Type of Therapy and Topic:  Group Therapy:  Feelings About Hospitalization  Participation Level:  Did Not Attend   Description of Group This process group involved patients discussing their feelings related to being hospitalized, as well as the benefits they see to being in the hospital.  These feelings and benefits were itemized.  The group then brainstormed specific ways in which they could seek those same benefits when they discharge and return home.  Therapeutic Goals 1. Patient will identify and describe positive and negative feelings related to hospitalization 2. Patient will verbalize benefits of hospitalization to themselves personally 3. Patients will brainstorm together ways they can obtain similar benefits in the outpatient setting, identify barriers to wellness and possible solutions  Summary of Patient Progress:  Did not attend   Therapeutic Modalities Cognitive [place], [place] 02/21/2020  4:41 PM

## 2020-02-21 NOTE — Progress Notes (Signed)
Pt is alert and oriented to person, place, time and situation. Pt is calm, cooperative, denies suicidal and homicidal ideation, denies hallucinations, denies feeling of depression and anxiety. Pt's affect is appropriately bright. Pt is social with peers, spending time in dayroom talking with peers and watching a movie, eating snacks. No distress noted, pt voices no complaints. Will continue to monitor pt per Q15 minute face checks and monitor for safety and progress.

## 2020-02-21 NOTE — Progress Notes (Signed)
[facility] MD Progress Note  02/21/2020 1:03 PM Samuel Molina  MRN:  [id number]  Samuel Molina is a 59 y.o. male who presents to the [facility] unit for treatment of depression in the context of recent suicidal attempt via  Overdose of unknown medication.   Interval History Patient was seen today for re-evaluation.  Nursing reports no events overnight. The patient has no issues with performing ADLs.  Patient has been medication compliant.    SUBJECTIVE: On assessment patient reports feeling "depressed", denies feeling suicidal or having any unsafe urges or plans. He continues to complain about hip pain, states current pain medications don`t work. He had hip MRI this AM. Reports he slept somewhat last night, but continues to complain about insomnia mostly due to severe hip pain. Denies side effects from current medications. He is willing to try increased dose of Trazodone tonight. Current suicidal/homicidal ideations: Denies Current auditory/visual hallucinations: Denies  MRI L hip nw/o and with contrast, 02/21/20: IMPRESSION: 1. Minimal degenerative changes but no stress fracture or AVN. 2. Unremarkable hip and pelvic musculature. Results shared with patient and printed per his request.  Principal Problem: Severe recurrent major depression without psychotic features ([place]) Diagnosis: Principal Problem:   Severe recurrent major depression without psychotic features ([place]) Active Problems:   Gastroesophageal reflux disease with esophagitis   Cocaine abuse ([place])   Arthritis of knee   Personality disorder ([place])  Total Time spent with patient: 15 minutes  Past Psychiatric History: see H&P  Past Medical History:  Past Medical History:  Diagnosis Date  . Anxiety   . Depression   . GERD (gastroesophageal reflux disease)   . Obesity   . Substance abuse [facility])     Past Surgical History:  Procedure Laterality Date  . ESOPHAGOGASTRODUODENOSCOPY (EGD) WITH PROPOFOL N/A 09/20/2017   Procedure:  ESOPHAGOGASTRODUODENOSCOPY (EGD) WITH PROPOFOL;  Surgeon: Lin Landsman, MD;  Location: [facility];  Service: Gastroenterology;  Laterality: N/A;  . KNEE SURGERY Right    Family History: History reviewed. No pertinent family history. Family Psychiatric  History: see H&P Social History:  Social History   Substance and Sexual Activity  Alcohol Use Not Currently     Social History   Substance and Sexual Activity  Drug Use Yes  . Types: Cocaine    Social History   Socioeconomic History  . Marital status: Single    Spouse name: Not on file  . Number of children: Not on file  . Years of education: Not on file  . Highest education level: Not on file  Occupational History  . Not on file  Tobacco Use  . Smoking status: Current Every Day Smoker    Packs/day: 0.50    Types: Cigarettes  . Smokeless tobacco: Never Used  Vaping Use  . Vaping Use: Never used  Substance and Sexual Activity  . Alcohol use: Not Currently  . Drug use: Yes    Types: Cocaine  . Sexual activity: Not on file  Other Topics Concern  . Not on file  Social History Narrative  . Not on file   Social Determinants of Health   Financial Resource Strain:   . Difficulty of Paying Living Expenses: Not on file  Food Insecurity:   . Worried About Charity fundraiser in the Last Year: Not on file  . Ran Out of Food in the Last Year: Not on file  Transportation Needs:   . Lack of Transportation (Medical): Not on file  . Lack of Transportation (Non-Medical): Not on  file  Physical Activity:   . Days of Exercise per Week: Not on file  . Minutes of Exercise per Session: Not on file  Stress:   . Feeling of Stress : Not on file  Social Connections:   . Frequency of Communication with Friends and Family: Not on file  . Frequency of Social Gatherings with Friends and Family: Not on file  . Attends Religious Services: Not on file  . Active Member of Clubs or Organizations: Not on file  . Attends Theatre manager Meetings: Not on file  . Marital Status: Not on file   Additional Social History:                         Sleep: Fair  Appetite:  Good  Current Medications: Current Facility-Administered Medications  Medication Dose Route Frequency Provider Last Rate Last Admin  . alum & mag hydroxide-simeth (MAALOX/MYLANTA) 200-200-20 MG/5ML suspension 30 mL  30 mL Oral Q4H PRN Clapacs, John T, MD      . alum & mag hydroxide-simeth (MAALOX/MYLANTA) 200-200-20 MG/5ML suspension 30 mL  30 mL Oral Q6H PRN Clapacs, John T, MD      . citalopram (CELEXA) tablet 10 mg  10 mg Oral Daily Clapacs, [name], MD   10 mg at 02/21/20 0816  . ketorolac (TORADOL) tablet 10 mg  10 mg Oral Q8H [name], MD   10 mg at 02/20/20 2124  . magnesium hydroxide (MILK OF MAGNESIA) suspension 30 mL  30 mL Oral Daily PRN Clapacs, John T, MD      . multivitamin with minerals tablet 1 tablet  1 tablet Oral Daily [name], MD   1 tablet at 02/21/20 0815  . ondansetron (ZOFRAN) tablet 4 mg  4 mg Oral Q8H PRN Clapacs, John T, MD      . pantoprazole (PROTONIX) EC tablet 80 mg  80 mg Oral Daily [name], MD   80 mg at 02/21/20 0816  . QUEtiapine (SEROQUEL) tablet 200 mg  200 mg Oral QHS [name], MD   200 mg at 02/20/20 2124  . traZODone (DESYREL) tablet 100 mg  100 mg Oral Once Caroline Sauger, NP      . traZODone (DESYREL) tablet 200 mg  200 mg Oral QHS PRN Larita Fife, MD        Lab Results: No results found for this or any previous visit (from the past 48 hour(s)).  Blood Alcohol level:  Lab Results  Component Value Date   ETH <10 02/14/2020   ETH <10 25/85/2778    Metabolic Disorder Labs: Lab Results  Component Value Date   HGBA1C 5.7 (H) 02/16/2020   MPG 117 02/16/2020   No results found for: PROLACTIN Lab Results  Component Value Date   CHOL 171 02/16/2020   TRIG 330 (H) 02/16/2020   HDL 36 (L) 02/16/2020   CHOLHDL 4.8 02/16/2020   VLDL 66 (H) 02/16/2020    LDLCALC 69 02/16/2020    Physical Findings: AIMS: Facial and Oral Movements Muscles of Facial Expression: None, normal Lips and Perioral Area: None, normal Jaw: None, normal Tongue: None, normal,Extremity Movements Upper (arms, wrists, hands, fingers): None, normal Lower (legs, knees, ankles, toes): None, normal, Trunk Movements Neck, shoulders, hips: None, normal, Overall Severity Severity of abnormal movements (highest score from questions above): None, normal Incapacitation due to abnormal movements: None, normal Patient's awareness of abnormal movements (rate only patient's report): No Awareness, Dental Status  Current problems with teeth and/or dentures?: No Does patient usually wear dentures?: No  CIWA:    COWS:     Musculoskeletal: Strength & Muscle Tone: within normal limits Gait & Station: normal Patient leans: N/A  Psychiatric Specialty Exam: Physical Exam   Review of Systems   Blood pressure (!) 148/77, pulse 69, temperature 98.5 F (36.9 C), temperature source Oral, resp. rate 18, height 6\' 1"  (1.854 m), weight 96.6 kg, SpO2 99 %.Body mass index is 28.1 kg/m.  General Appearance: Casual  Eye Contact:  Fair  Speech:  Normal Rate  Volume:  Normal  Mood:  Depressed  Affect:  Appropriate and Congruent  Thought Process:  Coherent, Goal Directed and Linear  Orientation:  Full (Time, Place, and Person)  Thought Content:  Logical  Suicidal Thoughts:  No  Homicidal Thoughts:  No  Memory:  Immediate;   Fair Recent;   Fair  Judgement:  Fair  Insight:  Fair  Psychomotor Activity:  Normal  Concentration:  Concentration: Fair and Attention Span: Fair  Recall:  [employer] of Knowledge:  Fair  Language:  Fair  Akathisia:  No  Handed:  Right  AIMS (if indicated):     Assets:  Communication Skills Desire for Improvement  ADL's:  Intact  Cognition:  WNL  Sleep:  Number of Hours: 3.5     Treatment Plan Summary: Daily contact with patient to assess and  evaluate symptoms and progress in treatment and Medication management   Patient is a 59 year old male with the above-stated past psychiatric history who is seen in follow-up.  Chart reviewed. Patient discussed with nursing. Patient reports partial mood improvement  - not suicidal; although he continues to report depressed mood and insomnia mostly in settings of chronic hip pain. Hip MRI came back with mild degenerative changes. Patient insists of consultation with ortho surgery and pain medicine. The dose of Trazodone will be increased tonight for sleep; no other medication changes.  Plan:  -continue inpatient psych admission; 15-minute checks; daily contact with patient to assess and evaluate symptoms and progress in treatment; psychoeducation.  -continue scheduled medications: . citalopram  10 mg Oral Daily  . ketorolac  10 mg Oral Q8H  . multivitamin with minerals  1 tablet Oral Daily  . pantoprazole  80 mg Oral Daily  . QUEtiapine  200 mg Oral QHS  . traZODone  100 mg Oral Once    -continue PRN medications.  alum & mag hydroxide-simeth, alum & mag hydroxide-simeth, magnesium hydroxide, ondansetron, traZODone 200mg  PO QHS PRN sleep.   -Disposition: possible d/c home early-midweek next week with outpatient psych f/u. All necessary aftercare will be arranged prior to discharge.  -  I certify that the patient does need, on a daily basis, active treatment furnished directly by or requiring the supervision of inpatient psychiatric facility personnel.   Larita Fife, MD 02/21/2020, 1:03 PM

## 2020-02-21 NOTE — Progress Notes (Signed)
Pt is alert and oriented x 4. Pt denies SI/HI/AVH and pain, but endorses discomfort. Discomfort r/t GERD and complications per Pt. Pt c/o nausea and reported vomiting which he reported to have scant blood and mucus-like. On call provider contacted and medication orders obtained. Pt willingly took medications. Also pt was scheduled for MRI of left hip for reporting pain. Approximately 0230 pt went for MRI. Pt retreated to his room and no further issues to report. Will continue to monitor and assess. Safety maintained.

## 2020-02-22 DIAGNOSIS — F332 Major depressive disorder, recurrent severe without psychotic features: Secondary | ICD-10-CM | POA: Diagnosis not present

## 2020-02-22 MED ORDER — MELOXICAM 7.5 MG PO TABS
15.0000 mg | ORAL_TABLET | Freq: Every day | ORAL | Status: DC
  Administered 2020-02-22 – 2020-02-24 (×3): 15 mg via ORAL
  Filled 2020-02-22 (×3): qty 2

## 2020-02-22 MED ORDER — QUETIAPINE FUMARATE 25 MG PO TABS
250.0000 mg | ORAL_TABLET | Freq: Every day | ORAL | Status: DC
  Administered 2020-02-22: 250 mg via ORAL
  Filled 2020-02-22: qty 1

## 2020-02-22 NOTE — BHH Group Notes (Signed)
LCSW Group Therapy Note   02/22/2020 1:05 PM- 2:00 PM    Type of Therapy and Topic: Group Therapy: Fears and Unhealthy Coping Skills   Participation Level: Minimal   Description of Group: The focus of this group was to discuss some of the prevalent fears that patients experience, and to identify the commonalities among group members. An exercise was used to initiate the discussion, followed by writing on the white board a group-generated list of unhealthy coping and healthy coping techniques to deal with each fear.   Therapeutic Goals: 1. Patient will identify and describe 3 fears they experience 2. Patient will identify one positive coping strategy for each fear they experience 3. Patient will respond empathically to peers statements regarding fears they experience   Summary of Patient Progress: Patient checked into group feeling wonderful. Patient wrote down 3 fears and then left group. One of patients fears was lashing out at people. CSW was unable to read patients handwriting.     Therapeutic Modalities Cognitive Behavioral Therapy Motivational Interviewing    Raina Mina, [place] 02/22/2020 3:17 PM

## 2020-02-22 NOTE — Plan of Care (Signed)
Patient denies SI/HI. Patient is out in milieu and interacts with peers and staff. Patient is compliant with medications. Monitoring continues. Patient remains safe on unit.   Problem: Education: Goal: Knowledge of [facility] Education information/materials will improve Outcome: Progressing Goal: Emotional status will improve 02/22/2020 1338 by Jolene Provost, RN Outcome: Progressing 02/22/2020 1250 by Jolene Provost, RN Outcome: Progressing Goal: Mental status will improve Outcome: Progressing Goal: Verbalization of understanding the information provided will improve Outcome: Progressing   Problem: Health Behavior/Discharge Planning: Goal: Compliance with treatment plan for underlying cause of condition will improve 02/22/2020 1338 by Jolene Provost, RN Outcome: Progressing 02/22/2020 1250 by Jolene Provost, RN Outcome: Progressing   Problem: Education: Goal: Knowledge of the prescribed therapeutic regimen will improve Outcome: Progressing   Problem: Coping: Goal: Coping ability will improve Outcome: Progressing Goal: Will verbalize feelings 02/22/2020 1338 by Jolene Provost, RN Outcome: Progressing 02/22/2020 1250 by Jolene Provost, RN Outcome: Progressing   Problem: Self-Concept: Goal: Will verbalize positive feelings about self Outcome: Progressing Goal: Level of anxiety will decrease Outcome: Progressing

## 2020-02-22 NOTE — Plan of Care (Signed)
Patient denies SI/HI. Patient is out in milieu and voices no concerns. Monitoring continues.    Problem: Education: Goal: Emotional status will improve Outcome: Progressing Goal: Mental status will improve Outcome: Progressing   Problem: Health Behavior/Discharge Planning: Goal: Compliance with treatment plan for underlying cause of condition will improve Outcome: Progressing   Problem: Education: Goal: Knowledge of the prescribed therapeutic regimen will improve Outcome: Progressing   Problem: Coping: Goal: Will verbalize feelings Outcome: Progressing

## 2020-02-22 NOTE — Progress Notes (Signed)
[facility] MD Progress Note  02/22/2020 8:46 AM Samuel Molina  MRN:  [id number]  Samuel Molina is a 59 y.o. male who presents to the [facility] unit for treatment of depression in the context of recent suicidal attempt via  Overdose of unknown medication.   Interval History Patient was seen today for re-evaluation.  Nursing reports no events overnight. The patient has no issues with performing ADLs.  Patient has been medication compliant.    SUBJECTIVE: On assessment patient continues to complain about hip pain and insomnia, that he partly relates to pain. He reports "okay" mood, states he is "less depressed" when on admission, denies feeling suicidal or having any unsafe urges or plans. He talks a lot about his family today, his children are grown, he has one 61yo grandson whom he is very proud of due to his success in basketball. Patient lists his  Family as protective factor against suicide.   He reports no effect from Toradol for his pain relief, he is asking to switch his back on Meloxicam he was taking during previous admission and thinks it was more effective. Denies side effects from current medications. He is willing to try increased dose of Seroquel tonight for mood and sleep. Current suicidal/homicidal ideations: Denies Current auditory/visual hallucinations: Denies  MRI L hip nw/o and with contrast, 02/21/20: IMPRESSION: 1. Minimal degenerative changes but no stress fracture or AVN. 2. Unremarkable hip and pelvic musculature. Results shared with patient and printed per his request.  Principal Problem: Severe recurrent major depression without psychotic features ([place]) Diagnosis: Principal Problem:   Severe recurrent major depression without psychotic features ([place]) Active Problems:   Gastroesophageal reflux disease with esophagitis   Cocaine abuse ([place])   Arthritis of knee   Personality disorder ([place])  Total Time spent with patient: 15 minutes  Past Psychiatric History: see H&P  Past  Medical History:  Past Medical History:  Diagnosis Date  . Anxiety   . Depression   . GERD (gastroesophageal reflux disease)   . Obesity   . Substance abuse [facility])     Past Surgical History:  Procedure Laterality Date  . ESOPHAGOGASTRODUODENOSCOPY (EGD) WITH PROPOFOL N/A 09/20/2017   Procedure: ESOPHAGOGASTRODUODENOSCOPY (EGD) WITH PROPOFOL;  Surgeon: Lin Landsman, MD;  Location: [facility];  Service: Gastroenterology;  Laterality: N/A;  . KNEE SURGERY Right    Family History: History reviewed. No pertinent family history. Family Psychiatric  History: see H&P Social History:  Social History   Substance and Sexual Activity  Alcohol Use Not Currently     Social History   Substance and Sexual Activity  Drug Use Yes  . Types: Cocaine    Social History   Socioeconomic History  . Marital status: Single    Spouse name: Not on file  . Number of children: Not on file  . Years of education: Not on file  . Highest education level: Not on file  Occupational History  . Not on file  Tobacco Use  . Smoking status: Current Every Day Smoker    Packs/day: 0.50    Types: Cigarettes  . Smokeless tobacco: Never Used  Vaping Use  . Vaping Use: Never used  Substance and Sexual Activity  . Alcohol use: Not Currently  . Drug use: Yes    Types: Cocaine  . Sexual activity: Not on file  Other Topics Concern  . Not on file  Social History Narrative  . Not on file   Social Determinants of Health   Financial Resource Strain:   .  Difficulty of Paying Living Expenses: Not on file  Food Insecurity:   . Worried About Charity fundraiser in the Last Year: Not on file  . Ran Out of Food in the Last Year: Not on file  Transportation Needs:   . Lack of Transportation (Medical): Not on file  . Lack of Transportation (Non-Medical): Not on file  Physical Activity:   . Days of Exercise per Week: Not on file  . Minutes of Exercise per Session: Not on file  Stress:   . Feeling of  Stress : Not on file  Social Connections:   . Frequency of Communication with Friends and Family: Not on file  . Frequency of Social Gatherings with Friends and Family: Not on file  . Attends Religious Services: Not on file  . Active Member of Clubs or Organizations: Not on file  . Attends Archivist Meetings: Not on file  . Marital Status: Not on file   Additional Social History:                         Sleep: Fair  Appetite:  Good  Current Medications: Current Facility-Administered Medications  Medication Dose Route Frequency Provider Last Rate Last Admin  . alum & mag hydroxide-simeth (MAALOX/MYLANTA) 200-200-20 MG/5ML suspension 30 mL  30 mL Oral Q4H PRN Clapacs, John T, MD      . alum & mag hydroxide-simeth (MAALOX/MYLANTA) 200-200-20 MG/5ML suspension 30 mL  30 mL Oral Q6H PRN Clapacs, John T, MD      . citalopram (CELEXA) tablet 10 mg  10 mg Oral Daily Clapacs, [name], MD   10 mg at 02/22/20 0814  . magnesium hydroxide (MILK OF MAGNESIA) suspension 30 mL  30 mL Oral Daily PRN Clapacs, John T, MD      . meloxicam (MOBIC) tablet 15 mg  15 mg Oral Daily Kailen Hinkle, Delrae Rend, MD      . multivitamin with minerals tablet 1 tablet  1 tablet Oral Daily [name], MD   1 tablet at 02/22/20 [id number]  . ondansetron (ZOFRAN) tablet 4 mg  4 mg Oral Q8H PRN Clapacs, [name], MD   4 mg at 02/21/20 2132  . pantoprazole (PROTONIX) EC tablet 80 mg  80 mg Oral Daily [name], MD   80 mg at 02/22/20 5631  . QUEtiapine (SEROQUEL) tablet 250 mg  250 mg Oral QHS Larita Fife, MD      . traZODone (DESYREL) tablet 100 mg  100 mg Oral Once Caroline Sauger, NP      . traZODone (DESYREL) tablet 200 mg  200 mg Oral QHS PRN Larita Fife, MD        Lab Results: No results found for this or any previous visit (from the past 48 hour(s)).  Blood Alcohol level:  Lab Results  Component Value Date   ETH <10 02/14/2020   ETH <10 49/70/2637    Metabolic Disorder Labs: Lab Results   Component Value Date   HGBA1C 5.7 (H) 02/16/2020   MPG 117 02/16/2020   No results found for: PROLACTIN Lab Results  Component Value Date   CHOL 171 02/16/2020   TRIG 330 (H) 02/16/2020   HDL 36 (L) 02/16/2020   CHOLHDL 4.8 02/16/2020   VLDL 66 (H) 02/16/2020   LDLCALC 69 02/16/2020    Physical Findings: AIMS: Facial and Oral Movements Muscles of Facial Expression: None, normal Lips and Perioral Area: None, normal Jaw: None, normal Tongue: None,  normal,Extremity Movements Upper (arms, wrists, hands, fingers): None, normal Lower (legs, knees, ankles, toes): None, normal, Trunk Movements Neck, shoulders, hips: None, normal, Overall Severity Severity of abnormal movements (highest score from questions above): None, normal Incapacitation due to abnormal movements: None, normal Patient's awareness of abnormal movements (rate only patient's report): No Awareness, Dental Status Current problems with teeth and/or dentures?: No Does patient usually wear dentures?: No  CIWA:    COWS:     Musculoskeletal: Strength & Muscle Tone: within normal limits Gait & Station: normal Patient leans: N/A  Psychiatric Specialty Exam: Physical Exam   Review of Systems   Blood pressure 140/84, pulse 65, temperature 98.4 F (36.9 C), temperature source Oral, resp. rate 18, height 6\' 1"  (1.854 m), weight 96.6 kg, SpO2 100 %.Body mass index is 28.1 kg/m.  General Appearance: Casual  Eye Contact:  Fair  Speech:  Normal Rate  Volume:  Normal  Mood:  Depressed  Affect:  Appropriate and Congruent  Thought Process:  Coherent, Goal Directed and Linear  Orientation:  Full (Time, Place, and Person)  Thought Content:  Logical  Suicidal Thoughts:  No  Homicidal Thoughts:  No  Memory:  Immediate;   Fair Recent;   Fair  Judgement:  Fair  Insight:  Fair  Psychomotor Activity:  Normal  Concentration:  Concentration: Fair and Attention Span: Fair  Recall:  [employer] of Knowledge:  Fair   Language:  Fair  Akathisia:  No  Handed:  Right  AIMS (if indicated):     Assets:  Communication Skills Desire for Improvement  ADL's:  Intact  Cognition:  WNL  Sleep:  Number of Hours: 7     Treatment Plan Summary: Daily contact with patient to assess and evaluate symptoms and progress in treatment and Medication management   Patient is a 59 year old male with the above-stated past psychiatric history who is seen in follow-up.  Chart reviewed. Patient discussed with nursing. Patient reports partial mood improvement  - not suicidal; although he continues to report depressed mood and insomnia mostly in settings of chronic hip pain. Hip MRI came back with mild degenerative changes. Patient insists of consultation with ortho surgery and pain medicine. Medication changes today: will switch from Ketorolac to Meloxicam for pain per patients request; will increase the dose of Seroquel for mood and sleep..  Plan:  -continue inpatient psych admission; 15-minute checks; daily contact with patient to assess and evaluate symptoms and progress in treatment; psychoeducation.  -scheduled medications: . citalopram  10 mg Oral Daily  . meloxicam  15 mg Oral Daily  . multivitamin with minerals  1 tablet Oral Daily  . pantoprazole  80 mg Oral Daily  . QUEtiapine  250 mg Oral QHS  . traZODone  100 mg Oral Once    -continue PRN medications.  alum & mag hydroxide-simeth, alum & mag hydroxide-simeth, magnesium hydroxide, ondansetron, traZODone 200mg  PO QHS PRN sleep.   -Disposition: possible d/c home early-midweek next week with outpatient psych f/u. All necessary aftercare will be arranged prior to discharge.  -  I certify that the patient does need, on a daily basis, active treatment furnished directly by or requiring the supervision of inpatient psychiatric facility personnel.   Larita Fife, MD 02/22/2020, 8:46 AM

## 2020-02-23 ENCOUNTER — Other Ambulatory Visit: Payer: Self-pay | Admitting: Behavioral Health

## 2020-02-23 DIAGNOSIS — F332 Major depressive disorder, recurrent severe without psychotic features: Secondary | ICD-10-CM | POA: Diagnosis not present

## 2020-02-23 MED ORDER — QUETIAPINE FUMARATE 300 MG PO TABS
300.0000 mg | ORAL_TABLET | Freq: Every day | ORAL | 0 refills | Status: DC
Start: 2020-02-23 — End: 2020-02-23

## 2020-02-23 MED ORDER — QUETIAPINE FUMARATE 200 MG PO TABS
300.0000 mg | ORAL_TABLET | Freq: Every day | ORAL | Status: DC
  Administered 2020-02-23: 300 mg via ORAL
  Filled 2020-02-23: qty 1

## 2020-02-23 MED ORDER — CITALOPRAM HYDROBROMIDE 10 MG PO TABS
10.0000 mg | ORAL_TABLET | Freq: Every day | ORAL | 0 refills | Status: DC
Start: 2020-02-23 — End: 2020-02-23

## 2020-02-23 MED ORDER — QUETIAPINE FUMARATE 300 MG PO TABS: 300.0000 mg | ORAL_TABLET | Freq: Every day | ORAL | 0 refills | Status: DC

## 2020-02-23 MED ORDER — CITALOPRAM HYDROBROMIDE 10 MG PO TABS
10.0000 mg | ORAL_TABLET | Freq: Every day | ORAL | 0 refills | Status: DC
Start: 2020-02-23 — End: 2020-02-24

## 2020-02-23 NOTE — Progress Notes (Signed)
Patient has complaint of GI issues this evening with complaints of nausea and vomiting. He has vomited at least twice this evening and has had a hard time keeping down food. He has been provided ginger ale and crackers to help with his symptoms.  He is med compliant and tolerated his meds without incident. He denied SI  HI AVH depression and anxiety at this encounter.  He remains safe at this time with 15 minute safety checks and encouraged to contact staff with any concerns.    Cleo Butler-Nicholson, LPN

## 2020-02-23 NOTE — Plan of Care (Signed)
  Problem: Education: Goal: Knowledge of [facility] Education information/materials will improve Outcome: Progressing Goal: Emotional status will improve Outcome: Progressing Goal: Mental status will improve Outcome: Progressing Goal: Verbalization of understanding the information provided will improve Outcome: Progressing   Problem: Health Behavior/Discharge Planning: Goal: Identification of resources available to assist in meeting health care needs will improve Outcome: Progressing Goal: Compliance with treatment plan for underlying cause of condition will improve Outcome: Progressing   Problem: Education: Goal: Utilization of techniques to improve thought processes will improve Outcome: Progressing Goal: Knowledge of the prescribed therapeutic regimen will improve Outcome: Progressing   Problem: Coping: Goal: Coping ability will improve Outcome: Progressing Goal: Will verbalize feelings Outcome: Progressing   Problem: Role Relationship: Goal: Will demonstrate positive changes in social behaviors and relationships Outcome: Progressing   Problem: Self-Concept: Goal: Will verbalize positive feelings about self Outcome: Progressing Goal: Level of anxiety will decrease Outcome: Progressing

## 2020-02-23 NOTE — BHH Group Notes (Signed)
LCSW Group Therapy Note   02/23/2020 1:00 PM  Type of Therapy and Topic:  Group Therapy:  Overcoming Obstacles   Participation Level:  None   Description of Group:    In this group patients will be encouraged to explore what they see as obstacles to their own wellness and recovery. They will be guided to discuss their thoughts, feelings, and behaviors related to these obstacles. The group will process together ways to cope with barriers, with attention given to specific choices patients can make. Each patient will be challenged to identify changes they are motivated to make in order to overcome their obstacles. This group will be process-oriented, with patients participating in exploration of their own experiences as well as giving and receiving support and challenge from other group members.   Therapeutic Goals: 1. Patient will identify personal and current obstacles as they relate to admission. 2. Patient will identify barriers that currently interfere with their wellness or overcoming obstacles.  3. Patient will identify feelings, thought process and behaviors related to these barriers. 4. Patient will identify two changes they are willing to make to overcome these obstacles:      Summary of Patient Progress Patient arrived to group late.  Patient did not engage in group discussion.     Therapeutic Modalities:   Cognitive Behavioral Therapy Solution Focused Therapy Motivational Interviewing Relapse Prevention Therapy  Assunta Curtis, MSW, LCSW 02/23/2020 12:53 PM

## 2020-02-23 NOTE — Progress Notes (Addendum)
Pt is alert and oriented to person, place, time and situation. Pt is calm, cooperative, social with peers in the dayroom, reports good appetite and reports he slept well last night. Pt is medication compliant, makes good eye contact, affect flat but brightens on contact. Pt voices no complaints, no distress noted or reported. Will continue to monitor pt per Q15 minute face checks and monitor for safety and progress.   Pt denies suicidal and homicidal ideation, denies hallucinations, denies feelings of depression and anxiety.

## 2020-02-23 NOTE — Progress Notes (Signed)
Recreation Therapy Notes   Date: 02/23/2020  Time: 9:30 am   Location: Craft room     Behavioral response: N/A   Intervention Topic: Necessities   Discussion/Intervention: Patient did not attend group.   Clinical Observations/Feedback:  Patient did not attend group.   Anniya Whiters LRT/CTRS        Keanu Frickey 02/23/2020 11:57 AM

## 2020-02-23 NOTE — Progress Notes (Signed)
[facility] MD Progress Note  02/23/2020 12:12 PM Samuel Molina  MRN:  [id number]  Samuel Molina is a 59 y.o. male who presents to the [facility] unit for treatment of depression in the context of recent suicidal attempt via  Overdose of unknown medication.   Interval History Patient was seen today for re-evaluation.  Nursing reports no events overnight. The patient has no issues with performing ADLs.  Patient has been medication compliant.    SUBJECTIVE: On assessment patient continues to complain about hip pain and insomnia, that he partly relates to pain. He reports "okay" mood, states he is "less depressed" when on admission, denies feeling suicidal or having any unsafe urges or plans. Denies side effects from current medications. He is willing to try increased dose of Seroquel tonight for mood and sleep. Current suicidal/homicidal ideations: Denies Current auditory/visual hallucinations: Denies  MRI L hip nw/o and with contrast, 02/21/20: IMPRESSION: 1. Minimal degenerative changes but no stress fracture or AVN. 2. Unremarkable hip and pelvic musculature. Results shared with patient and printed per his request.  Principal Problem: Severe recurrent major depression without psychotic features ([place]) Diagnosis: Principal Problem:   Severe recurrent major depression without psychotic features ([place]) Active Problems:   Gastroesophageal reflux disease with esophagitis   Cocaine abuse ([place])   Arthritis of knee   Personality disorder ([place])  Total Time spent with patient: 30 minutes  Past Psychiatric History: see H&P  Past Medical History:  Past Medical History:  Diagnosis Date  . Anxiety   . Depression   . GERD (gastroesophageal reflux disease)   . Obesity   . Substance abuse [facility])     Past Surgical History:  Procedure Laterality Date  . ESOPHAGOGASTRODUODENOSCOPY (EGD) WITH PROPOFOL N/A 09/20/2017   Procedure: ESOPHAGOGASTRODUODENOSCOPY (EGD) WITH PROPOFOL;  Surgeon: Lin Landsman,  MD;  Location: [place];  Service: Gastroenterology;  Laterality: N/A;  . KNEE SURGERY Right    Family History: History reviewed. No pertinent family history. Family Psychiatric  History: see H&P Social History:  Social History   Substance and Sexual Activity  Alcohol Use Not Currently     Social History   Substance and Sexual Activity  Drug Use Yes  . Types: Cocaine    Social History   Socioeconomic History  . Marital status: Single    Spouse name: Not on file  . Number of children: Not on file  . Years of education: Not on file  . Highest education level: Not on file  Occupational History  . Not on file  Tobacco Use  . Smoking status: Current Every Day Smoker    Packs/day: 0.50    Types: Cigarettes  . Smokeless tobacco: Never Used  Vaping Use  . Vaping Use: Never used  Substance and Sexual Activity  . Alcohol use: Not Currently  . Drug use: Yes    Types: Cocaine  . Sexual activity: Not on file  Other Topics Concern  . Not on file  Social History Narrative  . Not on file   Social Determinants of Health   Financial Resource Strain:   . Difficulty of Paying Living Expenses: Not on file  Food Insecurity:   . Worried About Charity fundraiser in the Last Year: Not on file  . Ran Out of Food in the Last Year: Not on file  Transportation Needs:   . Lack of Transportation (Medical): Not on file  . Lack of Transportation (Non-Medical): Not on file  Physical Activity:   . Days of Exercise  per Week: Not on file  . Minutes of Exercise per Session: Not on file  Stress:   . Feeling of Stress : Not on file  Social Connections:   . Frequency of Communication with Friends and Family: Not on file  . Frequency of Social Gatherings with Friends and Family: Not on file  . Attends Religious Services: Not on file  . Active Member of Clubs or Organizations: Not on file  . Attends Archivist Meetings: Not on file  . Marital Status: Not on file   Additional  Social History:                         Sleep: Fair  Appetite:  Good  Current Medications: Current Facility-Administered Medications  Medication Dose Route Frequency Provider Last Rate Last Admin  . alum & mag hydroxide-simeth (MAALOX/MYLANTA) 200-200-20 MG/5ML suspension 30 mL  30 mL Oral Q4H PRN Clapacs, John T, MD      . alum & mag hydroxide-simeth (MAALOX/MYLANTA) 200-200-20 MG/5ML suspension 30 mL  30 mL Oral Q6H PRN Clapacs, John T, MD      . citalopram (CELEXA) tablet 10 mg  10 mg Oral Daily Clapacs, [name], MD   10 mg at 02/23/20 0807  . magnesium hydroxide (MILK OF MAGNESIA) suspension 30 mL  30 mL Oral Daily PRN Clapacs, John T, MD      . meloxicam (MOBIC) tablet 15 mg  15 mg Oral Daily Larita Fife, MD   15 mg at 02/23/20 0807  . multivitamin with minerals tablet 1 tablet  1 tablet Oral Daily [name], MD   1 tablet at 02/23/20 [phone]  . ondansetron (ZOFRAN) tablet 4 mg  4 mg Oral Q8H PRN Clapacs, [name], MD   4 mg at 02/22/20 2055  . pantoprazole (PROTONIX) EC tablet 80 mg  80 mg Oral Daily [name], MD   80 mg at 02/23/20 0998  . QUEtiapine (SEROQUEL) tablet 300 mg  300 mg Oral QHS [name], MD        Lab Results: No results found for this or any previous visit (from the past 48 hour(s)).  Blood Alcohol level:  Lab Results  Component Value Date   ETH <10 02/14/2020   ETH <10 33/82/5053    Metabolic Disorder Labs: Lab Results  Component Value Date   HGBA1C 5.7 (H) 02/16/2020   MPG 117 02/16/2020   No results found for: PROLACTIN Lab Results  Component Value Date   CHOL 171 02/16/2020   TRIG 330 (H) 02/16/2020   HDL 36 (L) 02/16/2020   CHOLHDL 4.8 02/16/2020   VLDL 66 (H) 02/16/2020   LDLCALC 69 02/16/2020    Physical Findings: AIMS: Facial and Oral Movements Muscles of Facial Expression: None, normal Lips and Perioral Area: None, normal Jaw: None, normal Tongue: None, normal,Extremity Movements Upper (arms, wrists,  hands, fingers): None, normal Lower (legs, knees, ankles, toes): None, normal, Trunk Movements Neck, shoulders, hips: None, normal, Overall Severity Severity of abnormal movements (highest score from questions above): None, normal Incapacitation due to abnormal movements: None, normal Patient's awareness of abnormal movements (rate only patient's report): No Awareness, Dental Status Current problems with teeth and/or dentures?: No Does patient usually wear dentures?: No  CIWA:    COWS:     Musculoskeletal: Strength & Muscle Tone: within normal limits Gait & Station: normal Patient leans: N/A  Psychiatric Specialty Exam: Physical Exam Vitals and nursing note reviewed.  Constitutional:      Appearance: Normal appearance.  HENT:     Head: Normocephalic and atraumatic.     Right Ear: External ear normal.     Left Ear: External ear normal.     Nose: Nose normal.     Mouth/Throat:     Mouth: Mucous membranes are moist.     Pharynx: Oropharynx is clear.  Eyes:     Extraocular Movements: Extraocular movements intact.     Conjunctiva/sclera: Conjunctivae normal.     Pupils: Pupils are equal, round, and reactive to light.  Cardiovascular:     Rate and Rhythm: Normal rate.     Pulses: Normal pulses.  Pulmonary:     Effort: Pulmonary effort is normal.     Breath sounds: Normal breath sounds.  Abdominal:     General: Abdomen is flat.     Palpations: Abdomen is soft.  Musculoskeletal:        General: No swelling. Normal range of motion.     Cervical back: Normal range of motion and neck supple.  Skin:    General: Skin is warm and dry.  Neurological:     General: No focal deficit present.     Mental Status: He is alert and oriented to person, place, and time.  Psychiatric:        Attention and Perception: Attention and perception normal.        Mood and Affect: Affect is angry.        Speech: Speech normal.        Behavior: Behavior normal. Behavior is cooperative.         Thought Content: Thought content normal.        Cognition and Memory: Cognition and memory normal.        Judgment: Judgment is impulsive.     Review of Systems  Constitutional: Negative for activity change and fatigue.  HENT: Negative for rhinorrhea and sore throat.   Eyes: Negative for photophobia and visual disturbance.  Respiratory: Negative for cough and shortness of breath.   Cardiovascular: Negative for chest pain and palpitations.  Gastrointestinal: Negative for constipation, diarrhea, nausea and vomiting.  Endocrine: Negative for cold intolerance and heat intolerance.  Genitourinary: Negative for difficulty urinating and dysuria.  Musculoskeletal: Positive for arthralgias and myalgias.  Skin: Negative for rash and wound.  Allergic/Immunologic: Negative for food allergies and immunocompromised state.  Neurological: Negative for dizziness and headaches.  Hematological: Negative for adenopathy. Does not bruise/bleed easily.  Psychiatric/Behavioral: Negative for hallucinations and suicidal ideas.    Blood pressure 127/83, pulse (!) 59, temperature 97.8 F (36.6 C), temperature source Oral, resp. rate 18, height 6\' 1"  (1.854 m), weight 96.6 kg, SpO2 96 %.Body mass index is 28.1 kg/m.  General Appearance: Casual  Eye Contact:  Fair  Speech:  Normal Rate  Volume:  Normal  Mood:  Depressed  Affect:  Appropriate and Congruent  Thought Process:  Coherent, Goal Directed and Linear  Orientation:  Full (Time, Place, and Person)  Thought Content:  Logical  Suicidal Thoughts:  No  Homicidal Thoughts:  No  Memory:  Immediate;   Fair Recent;   Fair  Judgement:  Fair  Insight:  Fair  Psychomotor Activity:  Normal  Concentration:  Concentration: Fair and Attention Span: Fair  Recall:  [employer] of Knowledge:  Fair  Language:  Fair  Akathisia:  No  Handed:  Right  AIMS (if indicated):     Assets:  Communication Skills Desire for Improvement  ADL's:  Intact  Cognition:  WNL   Sleep:  Number of Hours: 7     Treatment Plan Summary: Daily contact with patient to assess and evaluate symptoms and progress in treatment and Medication management   Patient is a 59 year old male with the above-stated past psychiatric history who is seen in follow-up.  Chart reviewed. Patient discussed with nursing. Patient reports partial mood improvement  - not suicidal; although he continues to report depressed mood and insomnia mostly in settings of chronic hip pain. Hip MRI came back with mild degenerative changes.  Will increase the dose of Seroquel for mood and sleep..  Plan:  -continue inpatient psych admission; 15-minute checks; daily contact with patient to assess and evaluate symptoms and progress in treatment; psychoeducation.  -scheduled medications: . citalopram  10 mg Oral Daily  . meloxicam  15 mg Oral Daily  . multivitamin with minerals  1 tablet Oral Daily  . pantoprazole  80 mg Oral Daily  . QUEtiapine  300 mg Oral QHS    -continue PRN medications.  alum & mag hydroxide-simeth, alum & mag hydroxide-simeth, magnesium hydroxide, ondansetron    -Disposition: possible d/c home tomorrow with outpatient psych f/u. All necessary aftercare will be arranged prior to discharge.  -  I certify that the patient does need, on a daily basis, active treatment furnished directly by or requiring the supervision of inpatient psychiatric facility personnel.   [name], MD 02/23/2020, 12:12 PM

## 2020-02-23 NOTE — BHH Counselor (Signed)
Patient and CSWs meet regarding desire for discharge. He states that he will be going home and would like an appointment scheduled with [facility]. Patient reports that he is uncertain of transportation home at the moment but that he would know tomorrow morning. He also mentions continuing to contact [employer] when he gets home. Patient denies any need for clothing. No other concerns expressed. Contact ended without incident.   CSW contacted [facility] regarding hospital follow up appointment. HIPPA compliant voicemail left with contact information for follow through.   Chalmers Guest. Guerry Bruin, MSW, [place], [place] 02/23/2020 2:53 PM

## 2020-02-23 NOTE — Plan of Care (Signed)
°  Problem: Group Participation Goal: STG - Patient will engage in groups without prompting or encouragement from LRT x3 group sessions within 5 recreation therapy group sessions Description: STG - Patient will engage in groups without prompting or encouragement from LRT x3 group sessions within 5 recreation therapy group sessions Outcome: Not Progressing   

## 2020-02-24 DIAGNOSIS — F332 Major depressive disorder, recurrent severe without psychotic features: Secondary | ICD-10-CM | POA: Diagnosis not present

## 2020-02-24 MED ORDER — QUETIAPINE FUMARATE 300 MG PO TABS: 300.0000 mg | ORAL_TABLET | Freq: Every day | ORAL | 1 refills | Status: DC

## 2020-02-24 MED ORDER — CITALOPRAM HYDROBROMIDE 10 MG PO TABS: 10.0000 mg | ORAL_TABLET | Freq: Every day | ORAL | 1 refills | Status: DC

## 2020-02-24 MED ORDER — MELOXICAM 15 MG PO TABS: 15.0000 mg | ORAL_TABLET | Freq: Every day | ORAL | 0 refills | Status: DC

## 2020-02-24 NOTE — Progress Notes (Signed)
°  [facility] Adult Case Management Discharge Plan :  Will you be returning to the same living situation after discharge:  Yes,  pt reports that he is returning to his home. At discharge, do you have transportation home?: Yes,  CSW will assist pt with transportation needs if needed. Do you have the ability to pay for your medications: No.  Release of information consent forms completed and in the chart;  Patient's signature needed at discharge.  Patient to Follow up at:  Follow-up Information    [place] Follow up.   Why: Your appointment is 03/01/2020 at 12:30PM.  Please bring a copy of your prescriptions, ID, SS card and insurance card.  Thanks! Contact information: [place] [address] [phone]               Next level of care provider has access to [place] and Suicide Prevention discussed: Yes,  SPE completed with the patient. Patient declined collateral contact.  Have you used any form of tobacco in the last 30 days? (Cigarettes, Smokeless Tobacco, Cigars, and/or Pipes): Yes  Has patient been referred to the Quitline?: Patient refused referral  Patient has been referred for addiction treatment: Yes  Rozann Lesches, LCSW 02/24/2020, 9:11 AM

## 2020-02-24 NOTE — Plan of Care (Signed)
  Problem: Education: Goal: Knowledge of [facility] Education information/materials will improve Outcome: Progressing Goal: Emotional status will improve Outcome: Progressing Goal: Mental status will improve Outcome: Progressing Goal: Verbalization of understanding the information provided will improve Outcome: Progressing   Problem: Health Behavior/Discharge Planning: Goal: Identification of resources available to assist in meeting health care needs will improve Outcome: Progressing Goal: Compliance with treatment plan for underlying cause of condition will improve Outcome: Progressing   Problem: Education: Goal: Utilization of techniques to improve thought processes will improve Outcome: Progressing Goal: Knowledge of the prescribed therapeutic regimen will improve Outcome: Progressing   Problem: Coping: Goal: Coping ability will improve Outcome: Progressing Goal: Will verbalize feelings Outcome: Progressing   Problem: Role Relationship: Goal: Will demonstrate positive changes in social behaviors and relationships Outcome: Progressing   Problem: Self-Concept: Goal: Will verbalize positive feelings about self Outcome: Progressing Goal: Level of anxiety will decrease Outcome: Progressing

## 2020-02-24 NOTE — Progress Notes (Signed)
Recreation Therapy Notes   Date: 02/24/2020  Time: 9:30 am   Location: Craft room     Behavioral response: N/A   Intervention Topic: Goals   Discussion/Intervention: Patient did not attend group.   Clinical Observations/Feedback:  Patient did not attend group.   Burdette Forehand LRT/CTRS         Lavone Weisel 02/24/2020 12:58 PM

## 2020-02-24 NOTE — BHH Group Notes (Signed)
LCSW Group Therapy Note  02/24/2020 2:39 PM  Type of Therapy/Topic:  Group Therapy:  Feelings about Diagnosis  Participation Level:  Did Not Attend   Description of Group:   This group will allow patients to explore their thoughts and feelings about diagnoses they have received. Patients will be guided to explore their level of understanding and acceptance of these diagnoses. Facilitator will encourage patients to process their thoughts and feelings about the reactions of others to their diagnosis and will guide patients in identifying ways to discuss their diagnosis with significant others in their lives. This group will be process-oriented, with patients participating in exploration of their own experiences, giving and receiving support, and processing challenge from other group members.   Therapeutic Goals: 1. Patient will demonstrate understanding of diagnosis as evidenced by identifying two or more symptoms of the disorder 2. Patient will be able to express two feelings regarding the diagnosis 3. Patient will demonstrate their ability to communicate their needs through discussion and/or role play  Summary of Patient Progress: X  Therapeutic Modalities:   Cognitive Behavioral Therapy Brief Therapy Feelings Identification   Tanja Gift R. Guerry Bruin, MSW, [place], [place] 02/24/2020 2:39 PM

## 2020-02-24 NOTE — Progress Notes (Signed)
Patient is alert and oriented. He is calm, cooperative and gets along with most of the other peers on the unit.  He is med compliant and tolerates his meds without incident. He continues to complain of stomach issues, but is managing his discomfort. He denies SI  HI AVH depression and anxiety at this encounter. He is safe on the unit with 15 minute safety checks and was encouraged to contact staff with any concerns.     Cleo Butler-Nicholson, LPN

## 2020-02-24 NOTE — Progress Notes (Signed)
Pt discharged at 1430, pt reports that his ride is a "family member." Pt has been alert and oriented to person, place, time and situation. Pt is calm, cooperative, denies suicidal and homicidal ideation, denies hallucinations, denies feelings of depression and anxiety. No distress noted, none reported. All personal belongings returned to pt upon discharge.

## 2020-02-24 NOTE — Tx Team (Addendum)
Interdisciplinary Treatment and Diagnostic Plan Update  02/24/2020 Time of Session: 8:30AM Samuel Molina MRN: [id number]  Principal Diagnosis: Severe recurrent major depression without psychotic features [facility])  Secondary Diagnoses: Principal Problem:   Severe recurrent major depression without psychotic features ([place]) Active Problems:   Gastroesophageal reflux disease with esophagitis   Cocaine abuse ([place])   Arthritis of knee   Personality disorder (HCC)   Current Medications:  Current Facility-Administered Medications  Medication Dose Route Frequency Provider Last Rate Last Admin   alum & mag hydroxide-simeth (MAALOX/MYLANTA) 200-200-20 MG/5ML suspension 30 mL  30 mL Oral Q4H PRN Clapacs, John T, MD       alum & mag hydroxide-simeth (MAALOX/MYLANTA) 200-200-20 MG/5ML suspension 30 mL  30 mL Oral Q6H PRN Clapacs, John T, MD       citalopram (CELEXA) tablet 10 mg  10 mg Oral Daily Clapacs, John T, MD   10 mg at 02/24/20 0840   magnesium hydroxide (MILK OF MAGNESIA) suspension 30 mL  30 mL Oral Daily PRN Clapacs, [name], MD       meloxicam (MOBIC) tablet 15 mg  15 mg Oral Daily Larita Fife, MD   15 mg at 02/24/20 0840   multivitamin with minerals tablet 1 tablet  1 tablet Oral Daily [name], MD   1 tablet at 02/24/20 0840   ondansetron (ZOFRAN) tablet 4 mg  4 mg Oral Q8H PRN Clapacs, John T, MD   4 mg at 02/23/20 2018   pantoprazole (PROTONIX) EC tablet 80 mg  80 mg Oral Daily [name], MD   80 mg at 02/24/20 0840   QUEtiapine (SEROQUEL) tablet 300 mg  300 mg Oral QHS [name], MD   300 mg at 02/23/20 2229   PTA Medications: Medications Prior to Admission  Medication Sig Dispense Refill Last Dose   gabapentin (NEURONTIN) 300 MG capsule Take 1 capsule (300 mg total) by mouth 3 (three) times daily. 90 capsule 1    mirtazapine (REMERON) 30 MG tablet Take 1 tablet (30 mg total) by mouth at bedtime. 30 tablet 1    naproxen (NAPROSYN) 500 MG tablet  Take 1 tablet (500 mg total) by mouth 2 (two) times daily with a meal. 60 tablet 0    pantoprazole (PROTONIX) 40 MG tablet Take 2 tablets (80 mg total) by mouth daily. 60 tablet 1    [DISCONTINUED] citalopram (CELEXA) 10 MG tablet Take 1 tablet (10 mg total) by mouth daily. 30 tablet 1     Patient Stressors: Financial difficulties Marital or family conflict Medication change or noncompliance  Patient Strengths: Ability for insight Average or above average intelligence Communication skills Work skills  Treatment Modalities: Medication Management, Group therapy, Case management,  1 to 1 session with clinician, Psychoeducation, Recreational therapy.   Physician Treatment Plan for Primary Diagnosis: Severe recurrent major depression without psychotic features ([place]) Long Term Goal(s): Improvement in symptoms so as ready for discharge Improvement in symptoms so as ready for discharge   Short Term Goals: Ability to identify changes in lifestyle to reduce recurrence of condition will improve Ability to verbalize feelings will improve Ability to disclose and discuss suicidal ideas Ability to demonstrate self-control will improve Ability to identify and develop effective coping behaviors will improve Compliance with prescribed medications will improve Ability to identify triggers associated with substance abuse/mental health issues will improve Ability to identify changes in lifestyle to reduce recurrence of condition will improve Ability to verbalize feelings will improve Ability to disclose and discuss  suicidal ideas Ability to demonstrate self-control will improve Ability to identify and develop effective coping behaviors will improve Compliance with prescribed medications will improve Ability to identify triggers associated with substance abuse/mental health issues will improve  Medication Management: Evaluate patient's response, side effects, and tolerance of medication  regimen.  Therapeutic Interventions: 1 to 1 sessions, Unit Group sessions and Medication administration.  Evaluation of Outcomes: Adequate for Discharge  Physician Treatment Plan for Secondary Diagnosis: Principal Problem:   Severe recurrent major depression without psychotic features ([place]) Active Problems:   Gastroesophageal reflux disease with esophagitis   Cocaine abuse ([place])   Arthritis of knee   Personality disorder ([place])  Long Term Goal(s): Improvement in symptoms so as ready for discharge Improvement in symptoms so as ready for discharge   Short Term Goals: Ability to identify changes in lifestyle to reduce recurrence of condition will improve Ability to verbalize feelings will improve Ability to disclose and discuss suicidal ideas Ability to demonstrate self-control will improve Ability to identify and develop effective coping behaviors will improve Compliance with prescribed medications will improve Ability to identify triggers associated with substance abuse/mental health issues will improve Ability to identify changes in lifestyle to reduce recurrence of condition will improve Ability to verbalize feelings will improve Ability to disclose and discuss suicidal ideas Ability to demonstrate self-control will improve Ability to identify and develop effective coping behaviors will improve Compliance with prescribed medications will improve Ability to identify triggers associated with substance abuse/mental health issues will improve     Medication Management: Evaluate patient's response, side effects, and tolerance of medication regimen.  Therapeutic Interventions: 1 to 1 sessions, Unit Group sessions and Medication administration.  Evaluation of Outcomes: Adequate for Discharge   RN Treatment Plan for Primary Diagnosis: Severe recurrent major depression without psychotic features ([place]) Long Term Goal(s): Knowledge of disease and therapeutic regimen to maintain health  will improve  Short Term Goals: Ability to remain free from injury will improve, Ability to verbalize frustration and anger appropriately will improve, Ability to demonstrate self-control, Ability to participate in decision making will improve, Ability to disclose and discuss suicidal ideas, Ability to identify and develop effective coping behaviors will improve and Compliance with prescribed medications will improve  Medication Management: RN will administer medications as ordered by provider, will assess and evaluate patient's response and provide education to patient for prescribed medication. RN will report any adverse and/or side effects to prescribing provider.  Therapeutic Interventions: 1 on 1 counseling sessions, Psychoeducation, Medication administration, Evaluate responses to treatment, Monitor vital signs and CBGs as ordered, Perform/monitor CIWA, COWS, AIMS and Fall Risk screenings as ordered, Perform wound care treatments as ordered.  Evaluation of Outcomes: Adequate for Discharge   LCSW Treatment Plan for Primary Diagnosis: Severe recurrent major depression without psychotic features ([place]) Long Term Goal(s): Safe transition to appropriate next level of care at discharge, Engage patient in therapeutic group addressing interpersonal concerns.  Short Term Goals: Engage patient in aftercare planning with referrals and resources, Increase social support, Increase ability to appropriately verbalize feelings, Increase emotional regulation, Facilitate acceptance of mental health diagnosis and concerns, Facilitate patient progression through stages of change regarding substance use diagnoses and concerns, Identify triggers associated with mental health/substance abuse issues and Increase skills for wellness and recovery  Therapeutic Interventions: Assess for all discharge needs, 1 to 1 time with Social worker, Explore available resources and support systems, Assess for adequacy in community  support network, Educate family and significant other(s) on suicide prevention, Complete Psychosocial  Assessment, Interpersonal group therapy.  Evaluation of Outcomes: Adequate for Discharge   Progress in Treatment: Attending groups: Yes. Participating in groups: Yes. Taking medication as prescribed: Yes. Toleration medication: Yes. Family/Significant other contact made: No, will contact:  pt declined collateral contact. Patient understands diagnosis: Yes. Discussing patient identified problems/goals with staff: Yes. Medical problems stabilized or resolved: Yes. Denies suicidal/homicidal ideation: Yes. Issues/concerns per patient self-inventory: No. Other: none  New problem(s) identified: No, Describe:  none  New Short Term/Long Term Goal(s):  detox, elimination of symptoms of psychosis, medication management for mood stabilization; elimination of SI thoughts; development of comprehensive mental wellness/sobriety plan. Update 02/24/20: No updates currently.  Patient Goals:  "attitude, temper, patience" Update 02/24/20: No updates currently.  Discharge Plan or Barriers:  Patient reports that he is open to referral for inpatient. Update 02/24/20: Pt plans to return home with outpatient treatment through [facility].     Reason for Continuation of Hospitalization: Aggression Anxiety Depression Medication stabilization Suicidal ideation Withdrawal symptoms  Estimated Length of Stay:  1-7 days  Attendees: Patient: 02/24/2020 9:18 AM  Physician: Dr. Domingo Cocking, MD 02/24/2020 9:18 AM  Nursing: 02/24/2020 9:18 AM  RN Care Manager: 02/24/2020 9:18 AM  Social Worker: Assunta Curtis, LCSW 02/24/2020 9:18 AM  Recreational Therapist: 02/24/2020 9:18 AM  Other: Gwynneth Munson" [place], LCSW 02/24/2020 9:18 AM  Other:  02/24/2020 9:18 AM  Other: 02/24/2020 9:18 AM    Scribe for Treatment Team: [name], LCSW 02/24/2020 9:18 AM

## 2020-02-24 NOTE — [facility] Suicide Risk Assessment (Signed)
[facility] Discharge Suicide Risk Assessment   Principal Problem: Severe recurrent major depression without psychotic features [facility]) Discharge Diagnoses: Principal Problem:   Severe recurrent major depression without psychotic features ([place]) Active Problems:   Gastroesophageal reflux disease with esophagitis   Cocaine abuse ([place])   Arthritis of knee   Personality disorder ([place])   Total Time spent with patient: 30 minutes  Musculoskeletal: Strength & Muscle Tone: within normal limits Gait & Station: normal Patient leans: N/A  Psychiatric Specialty Exam: Review of Systems  Constitutional: Negative for appetite change and fatigue.  HENT: Negative for rhinorrhea and sore throat.   Eyes: Negative for photophobia and visual disturbance.  Respiratory: Negative for cough and shortness of breath.   Cardiovascular: Negative for chest pain and palpitations.  Gastrointestinal: Negative for constipation, diarrhea, nausea and vomiting.  Endocrine: Negative for cold intolerance and heat intolerance.  Genitourinary: Negative for difficulty urinating and dysuria.  Musculoskeletal: Positive for arthralgias and myalgias.  Skin: Negative for rash and wound.  Allergic/Immunologic: Negative for food allergies and immunocompromised state.  Neurological: Negative for dizziness and headaches.  Hematological: Negative for adenopathy. Does not bruise/bleed easily.  Psychiatric/Behavioral: Negative for behavioral problems, dysphoric mood, hallucinations, sleep disturbance and suicidal ideas.    Blood pressure 123/79, pulse 76, temperature 97.8 F (36.6 C), temperature source Oral, resp. rate 18, height 6\' 1"  (1.854 m), weight 96.6 kg, SpO2 100 %.Body mass index is 28.1 kg/m.  General Appearance: Fairly Groomed  Engineer, water::  Good  Speech:  Clear and [id number]  Volume:  Normal  Mood:  Euthymic  Affect:  Congruent  Thought Process:  Coherent and Linear  Orientation:  Full (Time, Place, and Person)   Thought Content:  Logical  Suicidal Thoughts:  No  Homicidal Thoughts:  No  Memory:  Immediate;   Fair Recent;   Fair Remote;   Fair  Judgement:  Fair  Insight:  Fair  Psychomotor Activity:  Normal  Concentration:  Fair  Recall:  Good  Fund of Knowledge:Good  Language: Good  Akathisia:  Negative  Handed:  Right  AIMS (if indicated):     Assets:  Communication Skills Desire for Improvement Housing Resilience Social Support  Sleep:  Number of Hours: 6.15  Cognition: WNL  ADL's:  Intact   Mental Status Per Nursing Assessment::   On Admission:  NA  Demographic Factors:  Male  Loss Factors: NA  Historical Factors: Prior suicide attempts and Impulsivity  Risk Reduction Factors:   Sense of responsibility to family, Employed, Positive social support, Positive therapeutic relationship and Positive coping skills or problem solving skills  Continued Clinical Symptoms:  Depression:   Impulsivity Previous Psychiatric Diagnoses and Treatments Medical Diagnoses and Treatments/Surgeries  Cognitive Features That Contribute To Risk:  None    Suicide Risk:  Minimal: No identifiable suicidal ideation.  Patients presenting with no risk factors but with morbid ruminations; may be classified as minimal risk based on the severity of the depressive symptoms   Follow-up Information    [facility] Follow up.   Why: Your appointment is 03/01/2020 at 12:30PM.  Please bring a copy of your prescriptions, ID, SS card and insurance card.  Thanks! Contact information: [place] [address] [phone]               Plan Of Care/Follow-up recommendations:  Activity:  as tolerated Diet:  low sodium, heart healthy diet  [name], MD 02/24/2020, 10:21 AM

## 2020-02-24 NOTE — Discharge Summary (Signed)
Physician Discharge Summary Note  Patient:  Samuel Molina is an 59 y.o., male MRN:  [id number] DOB:  [birth year] Patient phone:  [phone] (home)  Patient address:   [place] [address],  Total Time spent with patient: 30 minutes  Date of Admission:  02/16/2020 Date of Discharge: 02/24/2020   Reason for Admission:  Worsening depression and suicide attempt via overdose of unknown medication  Principal Problem: Severe recurrent major depression without psychotic features [facility]) Discharge Diagnoses: Principal Problem:   Severe recurrent major depression without psychotic features ([place]) Active Problems:   Gastroesophageal reflux disease with esophagitis   Cocaine abuse ([place])   Arthritis of knee   Personality disorder ([place])   Past Psychiatric History: Past history of at least 3 prior inpatient treatments here back to back. On admissions he was not really cooperative with much treatment in the hospital but demanded referral to inpatient rehab then left the hospital early when things did not happen quickly enough. No known prior suicide attempts prior to this most recent attempt.   Past Medical History:  Past Medical History:  Diagnosis Date   Anxiety    Depression    GERD (gastroesophageal reflux disease)    Obesity    Substance abuse ([place])     Past Surgical History:  Procedure Laterality Date   ESOPHAGOGASTRODUODENOSCOPY (EGD) WITH PROPOFOL N/A 09/20/2017   Procedure: ESOPHAGOGASTRODUODENOSCOPY (EGD) WITH PROPOFOL;  Surgeon: Lin Landsman, MD;  Location: [facility] ENDOSCOPY;  Service: Gastroenterology;  Laterality: N/A;   KNEE SURGERY Right    Family History: History reviewed. No pertinent family history. Family Psychiatric  History: Denies Social History:  Social History   Substance and Sexual Activity  Alcohol Use Not Currently     Social History   Substance and Sexual Activity  Drug Use Yes   Types: Cocaine    Social History    Socioeconomic History   Marital status: Single    Spouse name: Not on file   Number of children: Not on file   Years of education: Not on file   Highest education level: Not on file  Occupational History   Not on file  Tobacco Use   Smoking status: Current Every Day Smoker    Packs/day: 0.50    Types: Cigarettes   Smokeless tobacco: Never Used  Vaping Use   Vaping Use: Never used  Substance and Sexual Activity   Alcohol use: Not Currently   Drug use: Yes    Types: Cocaine   Sexual activity: Not on file  Other Topics Concern   Not on file  Social History Narrative   Not on file   Social Determinants of Health   Financial Resource Strain:    Difficulty of Paying Living Expenses: Not on file  Food Insecurity:    Worried About [place] in the Last Year: Not on file   Ran Out of Food in the Last Year: Not on file  Transportation Needs:    Lack of Transportation (Medical): Not on file   Lack of Transportation (Non-Medical): Not on file  Physical Activity:    Days of Exercise per Week: Not on file   Minutes of Exercise per Session: Not on file  Stress:    Feeling of Stress : Not on file  Social Connections:    Frequency of Communication with Friends and Family: Not on file   Frequency of Social Gatherings with Friends and Family: Not on file   Attends Religious Services: Not  on file   Active Member of Clubs or Organizations: Not on file   Attends Club or Organization Meetings: Not on file   Marital Status: Not on file    Hospital Course:  Samuel Molina was admitted for worsening depression and overdose on unknown medication. He had also recently relapsed on cocaine. During admission remeron and trazodone were stopped per patient request. He was started on seroquel, and titrated to 300 mg QHS for mood, insomnia, and impulsive anger outbursts. He was continued on Celexa 10 mg for depression. During his stay he also continued to  experience left hip pain that radiated to the front quardicep, and caused left shin numbness. Pain was not relieved by gabapentin, naproxen, or toradol. He was restarted on Mobic 15 mg daily. MRI was completed and showed "MRI L hip nw/o and with contrast, 02/21/20: IMPRESSION: 1. Minimal degenerative changes but no stress fracture or AVN. 2. Unremarkable hip and pelvic musculature. Results shared with patient and printed per his request." Samuel Molina did participate in groups during this admission, and interacted with peers and staff. He remains short-tempered and demanding. However, with addition of seroquel he seemed more able to utilize his coping skills from anger management. He denied further suicidal ideations, homicidal ideations, visual hallucinations, or auditory hallucinations. At time of discharge patient and treatment team felt he was safe for discharge with close outpatient follow-up.   Physical Findings: AIMS: Facial and Oral Movements Muscles of Facial Expression: None, normal Lips and Perioral Area: None, normal Jaw: None, normal Tongue: None, normal,Extremity Movements Upper (arms, wrists, hands, fingers): None, normal Lower (legs, knees, ankles, toes): None, normal, Trunk Movements Neck, shoulders, hips: None, normal, Overall Severity Severity of abnormal movements (highest score from questions above): None, normal Incapacitation due to abnormal movements: None, normal Patient's awareness of abnormal movements (rate only patient's report): No Awareness, Dental Status Current problems with teeth and/or dentures?: No Does patient usually wear dentures?: No  CIWA:    COWS:     Musculoskeletal: Strength & Muscle Tone: within normal limits Gait & Station: normal Patient leans: N/A  Psychiatric Specialty Exam: Physical Exam Vitals and nursing note reviewed.  Constitutional:      Appearance: Normal appearance.  HENT:     Head: Normocephalic and atraumatic.     Right  Ear: External ear normal.     Left Ear: External ear normal.     Nose: Nose normal.     Mouth/Throat:     Mouth: Mucous membranes are moist.     Pharynx: Oropharynx is clear.  Eyes:     Extraocular Movements: Extraocular movements intact.     Conjunctiva/sclera: Conjunctivae normal.     Pupils: Pupils are equal, round, and reactive to light.  Cardiovascular:     Rate and Rhythm: Normal rate.     Pulses: Normal pulses.  Pulmonary:     Effort: Pulmonary effort is normal.     Breath sounds: No wheezing.  Abdominal:     General: Abdomen is flat.     Palpations: Abdomen is soft.  Musculoskeletal:        General: No swelling. Normal range of motion.     Cervical back: Normal range of motion and neck supple.  Skin:    General: Skin is warm and dry.  Neurological:     General: No focal deficit present.     Mental Status: He is alert and oriented to person, place, and time.  Psychiatric:        Mood  and Affect: Mood normal.        Behavior: Behavior normal.        Thought Content: Thought content normal.        Judgment: Judgment normal.     Review of Systems  Review of Systems  Constitutional: Negative for appetite change and fatigue.  HENT: Negative for rhinorrhea and sore throat.   Eyes: Negative for photophobia and visual disturbance.  Respiratory: Negative for cough and shortness of breath.   Cardiovascular: Negative for chest pain and palpitations.  Gastrointestinal: Negative for constipation, diarrhea, nausea and vomiting.  Endocrine: Negative for cold intolerance and heat intolerance.  Genitourinary: Negative for difficulty urinating and dysuria.  Musculoskeletal: Positive for arthralgias and myalgias.  Skin: Negative for rash and wound.  Allergic/Immunologic: Negative for food allergies and immunocompromised state.  Neurological: Negative for dizziness and headaches.  Hematological: Negative for adenopathy. Does not bruise/bleed easily.  Psychiatric/Behavioral:  Negative for behavioral problems, dysphoric mood, hallucinations, sleep disturbance and suicidal ideas.    Blood pressure 123/79, pulse 76, temperature 97.8 F (36.6 C), temperature source Oral, resp. rate 18, height 6\' 1"  (1.854 m), weight 96.6 kg, SpO2 100 %.Body mass index is 28.1 kg/m.  General Appearance: Fairly Groomed  Engineer, water::  Good  Speech:  Clear and [id number]  Volume:  Normal  Mood:  Euthymic  Affect:  Congruent  Thought Process:  Coherent and Linear  Orientation:  Full (Time, Place, and Person)  Thought Content:  Logical  Suicidal Thoughts:  No  Homicidal Thoughts:  No  Memory:  Immediate;   Fair Recent;   Fair Remote;   Fair  Judgement:  Fair  Insight:  Fair  Psychomotor Activity:  Normal  Concentration:  Fair  Recall:  Good  Fund of Knowledge:Good  Language: Good  Akathisia:  Negative  Handed:  Right  AIMS (if indicated):     Assets:  Communication Skills Desire for Improvement Housing Resilience Social Support  Sleep:  Number of Hours: 6.15  Cognition: WNL  ADL's:  Intact        Have you used any form of tobacco in the last 30 days? (Cigarettes, Smokeless Tobacco, Cigars, and/or Pipes): Yes  Has this patient used any form of tobacco in the last 30 days? (Cigarettes, Smokeless Tobacco, Cigars, and/or Pipes) Yes, Yes, A prescription for an FDA-approved tobacco cessation medication was offered at discharge and the patient refused  Blood Alcohol level:  Lab Results  Component Value Date   [facility] <10 02/14/2020   ETH <10 35/45/6256    Metabolic Disorder Labs:  Lab Results  Component Value Date   HGBA1C 5.7 (H) 02/16/2020   MPG 117 02/16/2020   No results found for: PROLACTIN Lab Results  Component Value Date   CHOL 171 02/16/2020   TRIG 330 (H) 02/16/2020   HDL 36 (L) 02/16/2020   CHOLHDL 4.8 02/16/2020   VLDL 66 (H) 02/16/2020   [place] 69 02/16/2020    See Psychiatric Specialty Exam and Suicide Risk Assessment completed by  Attending Physician prior to discharge.  Discharge destination:  Home  Is patient on multiple antipsychotic therapies at discharge:  No   Has Patient had three or more failed trials of antipsychotic monotherapy by history:  No  Recommended Plan for Multiple Antipsychotic Therapies: NA  Discharge Instructions    Diet - low sodium heart healthy   Complete by: As directed    Increase activity slowly   Complete by: As directed      Allergies as of  02/24/2020      Reactions   Acetaminophen Itching, Nausea And Vomiting, Rash      Medication List    STOP taking these medications   gabapentin 300 MG capsule Commonly known as: NEURONTIN   mirtazapine 30 MG tablet Commonly known as: REMERON     TAKE these medications     Indication  citalopram 10 MG tablet Commonly known as: CELEXA Take 1 tablet (10 mg total) by mouth daily.  Indication: Depression   meloxicam 15 MG tablet Commonly known as: MOBIC Take 1 tablet (15 mg total) by mouth daily. Start taking on: February 25, 2020  Indication: Joint Damage causing Pain and Loss of Function   naproxen 500 MG tablet Commonly known as: NAPROSYN Take 1 tablet (500 mg total) by mouth 2 (two) times daily with a meal.  Indication: Joint Damage causing Pain and Loss of Function   pantoprazole 40 MG tablet Commonly known as: PROTONIX Take 2 tablets (80 mg total) by mouth daily.  Indication: Gastroesophageal Reflux Disease   QUEtiapine 300 MG tablet Commonly known as: SEROQUEL Take 1 tablet (300 mg total) by mouth at bedtime.  Indication: Major Depressive Disorder       Follow-up Information    [facility] up.   Why: Your appointment is 03/01/2020 at 12:30PM.  Please bring a copy of your prescriptions, ID, SS card and insurance card.  Thanks! Contact information: [place] [address] [phone]               Follow-up recommendations:  Activity:  as tolerated Diet:  low  sodium, heart healthy diet  Comments:  7-day supply of free medications provided to patient at discharge. 30-day scripts with 1 refill also sent directly to [facility] in [place] per patient request. Recommend follow-up with gastroenterologist for continued acid reflux.  Signed: [name], MD 02/24/2020, 10:24 AM

## 2020-02-24 NOTE — Progress Notes (Signed)
Recreation Therapy Notes  INPATIENT RECREATION TR PLAN  Patient Details Name: Samuel Molina MRN: [id number] DOB: 25-Jan-1961 Today's Date: 02/24/2020  Rec Therapy Plan Is patient appropriate for Therapeutic Recreation?: Yes Treatment times per week: at least 3 Estimated Length of Stay: 5-7 days TR Treatment/Interventions: Group participation (Comment)  Discharge Criteria Pt will be discharged from therapy if:: Discharged Treatment plan/goals/alternatives discussed and agreed upon by:: Patient/family  Discharge Summary Short term goals set: Patient will engage in groups without prompting or encouragement from LRT x3 group sessions within 5 recreation therapy group sessions Short term goals met: Not met Reason goals not met: Patient spent most of his time in his room Therapeutic equipment acquired: N/A Reason patient discharged from therapy: Discharge from hospital Pt/family agrees with progress & goals achieved: Yes Date patient discharged from therapy: 02/24/20   Gabrielly Mccrystal 02/24/2020, 1:56 PM

## 2020-02-24 NOTE — Plan of Care (Signed)
  Problem: Group Participation Goal: STG - Patient will engage in groups without prompting or encouragement from LRT x3 group sessions within 5 recreation therapy group sessions Description: STG - Patient will engage in groups without prompting or encouragement from LRT x3 group sessions within 5 recreation therapy group sessions 02/24/2020 1351 by Ernest Haber, LRT Outcome: Not Applicable [id number] by Ernest Haber, LRT Outcome: Not Met (add Reason) Note: Patient spent most of his time in his room.

## 2020-03-02 ENCOUNTER — Emergency Department: Payer: Self-pay

## 2020-03-02 ENCOUNTER — Emergency Department
Admission: EM | Admit: 2020-03-02 | Discharge: 2020-03-02 | Disposition: A | Discharge status: Home / Self Care | Payer: Self-pay | Attending: Emergency Medicine | Admitting: Emergency Medicine

## 2020-03-02 ENCOUNTER — Encounter: Payer: Self-pay | Admitting: Emergency Medicine

## 2020-03-02 DIAGNOSIS — M25552 Pain in left hip: Secondary | ICD-10-CM | POA: Insufficient documentation

## 2020-03-02 DIAGNOSIS — L03114 Cellulitis of left upper limb: Secondary | ICD-10-CM | POA: Insufficient documentation

## 2020-03-02 DIAGNOSIS — G8929 Other chronic pain: Secondary | ICD-10-CM | POA: Insufficient documentation

## 2020-03-02 DIAGNOSIS — F17210 Nicotine dependence, cigarettes, uncomplicated: Secondary | ICD-10-CM | POA: Insufficient documentation

## 2020-03-02 DIAGNOSIS — M25522 Pain in left elbow: Secondary | ICD-10-CM

## 2020-03-02 MED ORDER — CEPHALEXIN 500 MG PO CAPS
500.0000 mg | ORAL_CAPSULE | Freq: Once | ORAL | Status: AC
  Administered 2020-03-02: 500 mg via ORAL
  Filled 2020-03-02: qty 1

## 2020-03-02 MED ORDER — IBUPROFEN 600 MG PO TABS
600.0000 mg | ORAL_TABLET | Freq: Once | ORAL | Status: AC
  Administered 2020-03-02: 600 mg via ORAL
  Filled 2020-03-02: qty 1

## 2020-03-02 MED ORDER — METHOCARBAMOL 500 MG PO TABS
500.0000 mg | ORAL_TABLET | Freq: Once | ORAL | Status: AC
  Administered 2020-03-02: 500 mg via ORAL
  Filled 2020-03-02: qty 1

## 2020-03-02 MED ORDER — CEPHALEXIN 500 MG PO CAPS: 500.0000 mg | ORAL_CAPSULE | Freq: Four times a day (QID) | ORAL | 0 refills | Status: AC

## 2020-03-02 MED ORDER — GABAPENTIN 300 MG PO CAPS
300.0000 mg | ORAL_CAPSULE | Freq: Once | ORAL | Status: AC
  Administered 2020-03-02: 300 mg via ORAL
  Filled 2020-03-02: qty 1

## 2020-03-02 MED ORDER — METHOCARBAMOL 500 MG PO TABS: 500.0000 mg | ORAL_TABLET | Freq: Three times a day (TID) | ORAL | 0 refills | Status: DC | PRN

## 2020-03-02 NOTE — ED Triage Notes (Signed)
Pt in w/complaints of pain to L hip and thigh, parasthesia all the way down to L ankle. Able to bear weight on leg. Also reporting some boils or abcesses in multiple areas (posterior scalp and L elbow). Frequently seen for SI/HI, denies any of these thoughts presently. Has been compliant w/all meds, no AH/VH

## 2020-03-02 NOTE — ED Notes (Signed)
ED Provider at bedside. 

## 2020-03-02 NOTE — Discharge Instructions (Signed)
Please take Tylenol and ibuprofen/Advil for your pain.  It is safe to take them together, or to alternate them every few hours.  Take up to 1000mg  of Tylenol at a time, up to 4 times per day.  Do not take more than 4000 mg of Tylenol in 24 hours.  For ibuprofen, take 400-600 mg, 4-5 times per day.  If evidence of cellulitis, or a skin infection, to your left forearm.  You will be discharged a prescription for Keflex antibiotic to take 4 times daily for the next 7 days to treat the infection.  Please finish all 28 pills, even if your arm is getting better.  Return to the ED with any worsening symptoms despite these medications.

## 2020-03-02 NOTE — ED Notes (Signed)
Patient transported to X-ray 

## 2020-03-02 NOTE — ED Notes (Signed)
Pt calm, collective, denied pain or sob. Discharge instructions reviewed with patient

## 2020-03-02 NOTE — ED Notes (Signed)
Discharge signature pad not working

## 2020-03-02 NOTE — ED Provider Notes (Signed)
[facility] Department Provider Note ____________________________________________   First MD Initiated Contact with Patient 03/02/20 1534     (approximate)  I have reviewed the triage vital signs and the nursing notes.  HISTORY  Chief Complaint Hip Pain   HPI Samuel Molina is a 59 y.o. malewho presents to the ED for evaluation of left elbow and left hip pain.  Chart review indicates history of polysubstance abuse and frequent visits for suicidality and occasional admissions to our behavioral health unit. Patient just discharged 1 week ago from our behavioral health unit. While admitted, he was noted to have acute on chronic left hip pain necessitating an MRI of his left hip on 10/30, this showed mild degenerative changes but otherwise unremarkable.  Patient denies recent illnesses or antibiotics.  He presents to the ED with complaints of acute on chronic left hip pain is atraumatic.  He reports running out of his pain medications, including gabapentin, and he has had worsening pain for the past "few days" since then.  Patient is further reporting a boil to his left elbow that is popped and is developing a spreading red rash and swelling to his left forearm just distal to this.  Patient denies fevers or systemic symptoms.  Denies falls or trauma.  He reports doing ambulatory on his left hip.  Past Medical History:  Diagnosis Date  . Anxiety   . Depression   . GERD (gastroesophageal reflux disease)   . Obesity   . Substance abuse [facility])     Patient Active Problem List   Diagnosis Date Noted  . Personality disorder ([place]) 02/13/2020  . Bursitis 02/13/2020  . Substance induced mood disorder ([place]) 02/12/2020  . Severe recurrent major depression without psychotic features ([place]) 01/21/2020  . Cocaine abuse ([place]) 01/21/2020  . Hip pain 01/21/2020  . Major depression 01/20/2020  . Gastroesophageal reflux disease with esophagitis   . Morbid  obesity ([place]) 09/19/2017  . Arthritis of knee 11/03/2015    Past Surgical History:  Procedure Laterality Date  . ESOPHAGOGASTRODUODENOSCOPY (EGD) WITH PROPOFOL N/A 09/20/2017   Procedure: ESOPHAGOGASTRODUODENOSCOPY (EGD) WITH PROPOFOL;  Surgeon: Lin Landsman, MD;  Location: [facility];  Service: Gastroenterology;  Laterality: N/A;  . KNEE SURGERY Right     Prior to Admission medications   Medication Sig Start Date End Date Taking? Authorizing Provider  cephALEXin (KEFLEX) 500 MG capsule Take 1 capsule (500 mg total) by mouth 4 (four) times daily for 7 days. 03/02/20 03/09/20  Vladimir Crofts, MD  citalopram (CELEXA) 10 MG tablet Take 1 tablet (10 mg total) by mouth daily. 02/24/20   [name], MD  meloxicam (MOBIC) 15 MG tablet Take 1 tablet (15 mg total) by mouth daily. 02/25/20   [name], MD  methocarbamol (ROBAXIN) 500 MG tablet Take 1 tablet (500 mg total) by mouth every 8 (eight) hours as needed for muscle spasms. 03/02/20   Vladimir Crofts, MD  naproxen (NAPROSYN) 500 MG tablet Take 1 tablet (500 mg total) by mouth 2 (two) times daily with a meal. 02/03/20   Clapacs, [name], MD  pantoprazole (PROTONIX) 40 MG tablet Take 2 tablets (80 mg total) by mouth daily. 02/04/20   Clapacs, [name], MD  QUEtiapine (SEROQUEL) 300 MG tablet Take 1 tablet (300 mg total) by mouth at bedtime. 02/24/20   [name], MD  omeprazole (PRILOSEC OTC) 20 MG tablet Take 2 tablets (40 mg total) by mouth 2 (two) times daily before a meal. 09/19/17  01/29/19  Lin Landsman, MD    Allergies Acetaminophen  No family history on file.  Social History Social History   Tobacco Use  . Smoking status: Current Every Day Smoker    Packs/day: 0.50    Types: Cigarettes  . Smokeless tobacco: Never Used  Vaping Use  . Vaping Use: Never used  Substance Use Topics  . Alcohol use: Not Currently  . Drug use: Yes    Types: Cocaine    Review of Systems  Constitutional: No  fever/chills Eyes: No visual changes. ENT: No sore throat. Cardiovascular: Denies chest pain. Respiratory: Denies shortness of breath. Gastrointestinal: No abdominal pain.  No nausea, no vomiting.  No diarrhea.  No constipation. Genitourinary: Negative for dysuria. Musculoskeletal: Negative for back pain.  Positive for acute on chronic left hip pain and positive for left elbow pain. Skin: Positive for left elbow swelling and rash. Neurological: Negative for headaches, focal weakness or numbness.  ____________________________________________   PHYSICAL EXAM:  VITAL SIGNS: Vitals:   03/02/20 1408 03/02/20 1711  BP: (!) 153/71 135/70  Pulse: 86 70  Resp: 18 18  Temp: 98.3 F (36.8 C) 98 F (36.7 C)  SpO2: 96% 100%     Constitutional: Alert and oriented. Well appearing and in no acute distress. Eyes: Conjunctivae are normal. PERRL. EOMI. Head: Atraumatic. Nose: No congestion/rhinnorhea. Mouth/Throat: Mucous membranes are moist.  Oropharynx non-erythematous. Neck: No stridor. No cervical spine tenderness to palpation. Cardiovascular: Normal rate, regular rhythm. Grossly normal heart sounds.  Good peripheral circulation. Respiratory: Normal respiratory effort.  No retractions. Lungs CTAB. Gastrointestinal: Soft , nondistended, nontender to palpation. No CVA tenderness. Musculoskeletal:  No joint effusions. No signs of acute trauma. Patient readily ranges his left elbow with active and passive ROM.  He has localized swelling over his olecranon, warmth and tenderness to palpation.  Distal to this, along his dorsal left forearm he has some mild swelling, erythema and tenderness to palpation.  No firmness.  Left wrist with strong and symmetrical 2+ radial pulse, brisk capillary refill beyond this. Patient ambulatory with a slightly antalgic gait. Neurologic:  Normal speech and language. No gross focal neurologic deficits are appreciated. No gait instability noted. Skin:  Skin is warm,  dry and intact.  Psychiatric: Mood and affect are normal. Speech and behavior are normal. ________________________________________  RADIOLOGY  ED MD interpretation: Plain film of the left elbow reviewed by me with soft tissue edema without evidence of bony injury.  Official radiology report(s): DG Elbow Complete Left  Result Date: 03/02/2020 CLINICAL DATA:  Pain over olecranon EXAM: LEFT ELBOW - COMPLETE 3+ VIEW COMPARISON:  None. FINDINGS: Frontal, bilateral oblique, lateral views of the left elbow are obtained. There are no acute or destructive bony lesions. Minimal enthesopathic changes seen at the olecranon. Mild osteoarthritis with marginal osteophytes along the ulna. There is diffuse soft tissue edema. No evidence of joint effusion. IMPRESSION: 1. Diffuse soft tissue edema. 2. Mild osteoarthritis.  No acute bony abnormality. Electronically Signed   By: Randa Ngo M.D.   On: 03/02/2020 16:58    ____________________________________________   PROCEDURES and INTERVENTIONS  Procedure(s) performed (including Critical Care):  Procedures  Medications  cephALEXin (KEFLEX) capsule 500 mg (500 mg Oral Given 03/02/20 1557)  ibuprofen (ADVIL) tablet 600 mg (600 mg Oral Given 03/02/20 1559)  gabapentin (NEURONTIN) capsule 300 mg (300 mg Oral Given 03/02/20 1558)  methocarbamol (ROBAXIN) tablet 500 mg (500 mg Oral Given 03/02/20 1701)    ____________________________________________   MDM / ED COURSE  59 year old male presents to the ED with acute left elbow pain, likely due to olecranon bursitis with distal cellulitis, and amenable to outpatient management.  Normal vitals on room air.  Exam shows soft tissue swelling, swelling and erythema to his left dorsal forearm consistent with cellulitis.  He has no signs of compartment syndrome or neurovascular deficits.  He has an area of swelling over his olecranon most consistent with bursitis.  He has full active and passive ROM of his left  elbow and no evidence of septic arthritis.  While he has chronic left hip pain, MRI of last week reviewed that only shows mild degenerative changes.  He has no subsequent injury and he is ambulatory, so I see no indication for further imaging of this joint.  Start the patient on a course of Keflex, Robaxin and NSAIDs, provided information to follow-up with a new PCP and we discussed return precautions for the ED.  Patient medically stable for discharge home.  Clinical Course as of Mar 03 1743  Tue Mar 02, 2020  1613 Patient ambulates independently to the restroom   [DS]    Clinical Course User Index [DS] Vladimir Crofts, MD    ____________________________________________   FINAL CLINICAL IMPRESSION(S) / ED DIAGNOSES  Final diagnoses:  Left elbow pain  Cellulitis of left upper extremity     ED Discharge Orders         Ordered    cephALEXin (KEFLEX) 500 MG capsule  4 times daily        03/02/20 1704    methocarbamol (ROBAXIN) 500 MG tablet  Every 8 hours PRN        03/02/20 1705           Olof Marcil   Note:  This document was prepared using Systems analyst and may include unintentional dictation errors.   Vladimir Crofts, MD 03/02/20 [phone]

## 2020-03-25 ENCOUNTER — Other Ambulatory Visit: Payer: Self-pay

## 2020-03-25 ENCOUNTER — Emergency Department
Admission: EM | Admit: 2020-03-25 | Discharge: 2020-03-26 | Disposition: A | Discharge status: Home / Self Care | Payer: Medicaid Other | Attending: Emergency Medicine | Admitting: Emergency Medicine

## 2020-03-25 DIAGNOSIS — F609 Personality disorder, unspecified: Secondary | ICD-10-CM | POA: Diagnosis present

## 2020-03-25 DIAGNOSIS — K219 Gastro-esophageal reflux disease without esophagitis: Secondary | ICD-10-CM | POA: Insufficient documentation

## 2020-03-25 DIAGNOSIS — Z20822 Contact with and (suspected) exposure to covid-19: Secondary | ICD-10-CM | POA: Insufficient documentation

## 2020-03-25 DIAGNOSIS — F1410 Cocaine abuse, uncomplicated: Secondary | ICD-10-CM | POA: Diagnosis present

## 2020-03-25 DIAGNOSIS — F17210 Nicotine dependence, cigarettes, uncomplicated: Secondary | ICD-10-CM | POA: Insufficient documentation

## 2020-03-25 DIAGNOSIS — Z765 Malingerer [conscious simulation]: Secondary | ICD-10-CM | POA: Diagnosis not present

## 2020-03-25 DIAGNOSIS — R45851 Suicidal ideations: Secondary | ICD-10-CM

## 2020-03-25 DIAGNOSIS — G8929 Other chronic pain: Secondary | ICD-10-CM | POA: Insufficient documentation

## 2020-03-25 DIAGNOSIS — F1490 Cocaine use, unspecified, uncomplicated: Secondary | ICD-10-CM | POA: Insufficient documentation

## 2020-03-25 DIAGNOSIS — F1910 Other psychoactive substance abuse, uncomplicated: Secondary | ICD-10-CM | POA: Insufficient documentation

## 2020-03-25 DIAGNOSIS — R110 Nausea: Secondary | ICD-10-CM | POA: Insufficient documentation

## 2020-03-25 DIAGNOSIS — T50902A Poisoning by unspecified drugs, medicaments and biological substances, intentional self-harm, initial encounter: Secondary | ICD-10-CM | POA: Insufficient documentation

## 2020-03-25 DIAGNOSIS — F332 Major depressive disorder, recurrent severe without psychotic features: Secondary | ICD-10-CM | POA: Insufficient documentation

## 2020-03-25 LAB — COMPREHENSIVE METABOLIC PANEL
ALT: 16 U/L (ref 0–44)
AST: 34 U/L (ref 15–41)
Albumin: 4.4 g/dL (ref 3.5–5.0)
Alkaline Phosphatase: 78 U/L (ref 38–126)
Anion gap: 14 (ref 5–15)
BUN: 21 mg/dL — ABNORMAL HIGH (ref 6–20)
CO2: 22 mmol/L (ref 22–32)
Calcium: 9.5 mg/dL (ref 8.9–10.3)
Chloride: 103 mmol/L (ref 98–111)
Creatinine, Ser: 1.17 mg/dL (ref 0.61–1.24)
GFR, Estimated: >60 mL/min (ref >60)
Glucose, Bld: 97 mg/dL (ref 70–99)
Potassium: 3.7 mmol/L (ref 3.5–5.1)
Sodium: 139 mmol/L (ref 135–145)
Total Bilirubin: 1.7 mg/dL — ABNORMAL HIGH (ref 0.3–1.2)
Total Protein: 8.1 g/dL (ref 6.5–8.1)

## 2020-03-25 LAB — URINE DRUG SCREEN, QUALITATIVE (ARMC ONLY)
Amphetamines, Ur Screen: NOT DETECTED
Barbiturates, Ur Screen: NOT DETECTED
Benzodiazepine, Ur Scrn: NOT DETECTED
Cannabinoid 50 Ng, Ur ~~LOC~~: NOT DETECTED
Cocaine Metabolite,Ur ~~LOC~~: POSITIVE — AB
MDMA (Ecstasy)Ur Screen: NOT DETECTED
Methadone Scn, Ur: NOT DETECTED
Opiate, Ur Screen: NOT DETECTED
Phencyclidine (PCP) Ur S: NOT DETECTED
Tricyclic, Ur Screen: NOT DETECTED

## 2020-03-25 LAB — CBC WITH DIFFERENTIAL/PLATELET
Abs Immature Granulocytes: 0.02 10*3/uL (ref 0.00–0.07)
Basophils Absolute: 0.1 10*3/uL (ref 0.0–0.1)
Basophils Relative: 1 %
Eosinophils Absolute: 0.2 10*3/uL (ref 0.0–0.5)
Eosinophils Relative: 2 %
HCT: 42.8 % (ref 39.0–52.0)
Hemoglobin: 13.8 g/dL (ref 13.0–17.0)
Immature Granulocytes: 0 %
Lymphocytes Relative: 42 %
Lymphs Abs: 3.3 10*3/uL (ref 0.7–4.0)
MCH: 26.2 pg (ref 26.0–34.0)
MCHC: 32.2 g/dL (ref 30.0–36.0)
MCV: 81.2 fL (ref 80.0–100.0)
Monocytes Absolute: 1.3 10*3/uL — ABNORMAL HIGH (ref 0.1–1.0)
Monocytes Relative: 16 %
Neutro Abs: 3.1 10*3/uL (ref 1.7–7.7)
Neutrophils Relative %: 39 %
Platelets: 292 10*3/uL (ref 150–400)
RBC: 5.27 MIL/uL (ref 4.22–5.81)
RDW: 14.7 % (ref 11.5–15.5)
WBC: 8 10*3/uL (ref 4.0–10.5)
nRBC: 0 % (ref 0.0–0.2)

## 2020-03-25 LAB — ACETAMINOPHEN LEVEL
Acetaminophen (Tylenol), Serum: <10 ug/mL — ABNORMAL LOW (ref 10–30)
Acetaminophen (Tylenol), Serum: <10 ug/mL — ABNORMAL LOW (ref 10–30)

## 2020-03-25 LAB — ETHANOL: Alcohol, Ethyl (B): <10 mg/dL (ref <10)

## 2020-03-25 LAB — RESP PANEL BY RT-PCR (FLU A&B, COVID) ARPGX2
Influenza A by PCR: NEGATIVE
Influenza B by PCR: NEGATIVE
SARS Coronavirus 2 by RT PCR: NEGATIVE

## 2020-03-25 LAB — SALICYLATE LEVEL: Salicylate Lvl: <7 mg/dL — ABNORMAL LOW (ref 7.0–30.0)

## 2020-03-25 LAB — LIPASE, BLOOD: Lipase: 28 U/L (ref 11–51)

## 2020-03-25 MED ORDER — ONDANSETRON HCL 4 MG/2ML IJ SOLN: 4.0000 mg | Freq: Once | INTRAMUSCULAR | Status: DC

## 2020-03-25 MED ORDER — CHARCOAL ACTIVATED PO LIQD
50.0000 g | Freq: Once | ORAL | Status: AC
  Administered 2020-03-25: 50 g via ORAL
  Filled 2020-03-25: qty 240

## 2020-03-25 NOTE — ED Notes (Signed)
Patient complained to sitter and this RN he was having leg cramps. Received orders for Motrin however patient continues to sleep soundly and will let patient get his rest. When patient awakens, will medicate and give him the gingerale he requested before he fell asleep. Of note, patient has had 3 gingerales since drinking the activated charcoal.

## 2020-03-25 NOTE — ED Notes (Addendum)
Patient is calm and cooperative at this time. Agreed to drink activated charcoal. Apologizes for his "loudness and lack of tolerance". Given popsicles as per his request after the charcoal. Sitter in room with patient. Remote control given. Will continue to monitor.
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
## 2020-03-25 NOTE — ED Triage Notes (Signed)
Per pt report pt ingested 20-30 pills unknown what he took and ingested medications from friends medication cabinet. Reports this was a suicide attempt with increased feelings of depression. Reports generalized feelings of HI without plan. C/O L sided chest pain with radiation to back and numbness in L leg. Pt alert and following commands in triage. Breathing unlabored speaking in full sentences.

## 2020-03-25 NOTE — ED Notes (Signed)
Patient dressed in our scrubs at this time, patient had one pair of black shoes, black pair of socks,black belt, pair of jeans ,black and white shirt  And red underwear and blue book bag all things placed in belongings bags (3) and given to nurse Lea.

## 2020-03-25 NOTE — ED Notes (Signed)
Patient moved the to quad ,room 22
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
## 2020-03-25 NOTE — ED Provider Notes (Signed)
Emergency department transfer of care note  Care of this patient was signed out to me by the previous provider pending psychiatric evaluation.  All pertinent patient formation was conveyed and all questions were answered.  After psychiatric evaluation by [name], patient was deemed stable for discharge with outpatient follow-up and resources.  The patient has been reexamined and is ready to be discharged.  All diagnostic results have been reviewed and discussed with the patient/family.  Care plan has been outlined and the patient/family understands all current diagnoses, results, and treatment plans.  There are no new complaints, changes, or physical findings at this time.  All questions have been addressed and answered.  All medications, if any, that were given while in the emergency department or any that are being prescribed have been reviewed with the patient/family.  All side effects and adverse reactions have been explained.  Patient was instructed to, and agrees to follow-up with their primary care physician as well as return to the emergency department if any new or worsening symptoms develop.   Naaman Plummer, MD 03/25/20 [phone]

## 2020-03-25 NOTE — ED Notes (Signed)
Spoke with poison control in reference to possibility that patient took Eloquis. Poison center advises to monitor for possibility of increased bleeding. Labwork has been completed and poison center will call back with inquiries into results.

## 2020-03-25 NOTE — ED Notes (Signed)
Patient in bathroom throwing up at this time, notified nurse Lea.

## 2020-03-25 NOTE — ED Provider Notes (Addendum)
[facility] Emergency Department Provider Note   ____________________________________________    I have reviewed the triage vital signs and the nursing notes.   HISTORY  Chief Complaint Drug Overdose     HPI Samuel Molina is a 59 y.o. male with history of substance abuse, anxiety, depression, suicide attempts who presents today after "taking a handful of pills" approximately 1 hour prior to arrival.  He states he took a friend's pills, he is not entirely sure what he took.  He thinks it may have been Eliquis or Elavil but he is not certain.  He was doing this to harm himself but then "had a change of heart ".  Patient complains of mild nausea at this time worsening of his chronic acid reflux.  Past Medical History:  Diagnosis Date  . Anxiety   . Depression   . GERD (gastroesophageal reflux disease)   . Obesity   . Substance abuse [facility])     Patient Active Problem List   Diagnosis Date Noted  . Personality disorder ([place]) 02/13/2020  . Bursitis 02/13/2020  . Substance induced mood disorder ([place]) 02/12/2020  . Severe recurrent major depression without psychotic features ([place]) 01/21/2020  . Cocaine abuse ([place]) 01/21/2020  . Hip pain 01/21/2020  . Major depression 01/20/2020  . Gastroesophageal reflux disease with esophagitis   . Morbid obesity ([place]) 09/19/2017  . Arthritis of knee 11/03/2015    Past Surgical History:  Procedure Laterality Date  . ESOPHAGOGASTRODUODENOSCOPY (EGD) WITH PROPOFOL N/A 09/20/2017   Procedure: ESOPHAGOGASTRODUODENOSCOPY (EGD) WITH PROPOFOL;  Surgeon: Lin Landsman, MD;  Location: [place];  Service: Gastroenterology;  Laterality: N/A;  . KNEE SURGERY Right     Prior to Admission medications   Medication Sig Start Date End Date Taking? Authorizing Provider  citalopram (CELEXA) 10 MG tablet Take 1 tablet (10 mg total) by mouth daily. 02/24/20   [name], MD  meloxicam (MOBIC) 15 MG tablet Take  1 tablet (15 mg total) by mouth daily. 02/25/20   [name], MD  methocarbamol (ROBAXIN) 500 MG tablet Take 1 tablet (500 mg total) by mouth every 8 (eight) hours as needed for muscle spasms. 03/02/20   Vladimir Crofts, MD  naproxen (NAPROSYN) 500 MG tablet Take 1 tablet (500 mg total) by mouth 2 (two) times daily with a meal. 02/03/20   Clapacs, [name], MD  pantoprazole (PROTONIX) 40 MG tablet Take 2 tablets (80 mg total) by mouth daily. 02/04/20   Clapacs, [name], MD  QUEtiapine (SEROQUEL) 300 MG tablet Take 1 tablet (300 mg total) by mouth at bedtime. 02/24/20   [name], MD  omeprazole (PRILOSEC OTC) 20 MG tablet Take 2 tablets (40 mg total) by mouth 2 (two) times daily before a meal. 09/19/17 01/29/19  Lin Landsman, MD     Allergies Acetaminophen  No family history on file.  Social History Social History   Tobacco Use  . Smoking status: Current Every Day Smoker    Packs/day: 0.50    Types: Cigarettes  . Smokeless tobacco: Never Used  Vaping Use  . Vaping Use: Never used  Substance Use Topics  . Alcohol use: Not Currently  . Drug use: Yes    Types: Cocaine    Review of Systems  Constitutional: No fever/chills Eyes: No visual changes.  ENT: No sore throat. Cardiovascular: Denies chest pain. Respiratory: Denies shortness of breath. Gastrointestinal: As above, no abdominal pain Genitourinary: Negative for dysuria. Musculoskeletal: Negative for back pain.  Skin: Negative for rash. Neurological: Negative for headaches   ____________________________________________   PHYSICAL EXAM:  VITAL SIGNS: ED Triage Vitals  Enc Vitals Group     BP 03/25/20 0653 (!) 154/76     Pulse Rate 03/25/20 0653 65     Resp 03/25/20 0653 (!) 21     Temp 03/25/20 0653 98.3 F (36.8 C)     Temp Source 03/25/20 0653 Oral     SpO2 03/25/20 0653 99 %     Weight 03/25/20 0656 98.4 kg (217 lb)     Height 03/25/20 0656 1.854 m (6\' 1" )     Head Circumference --      Peak Flow  --      Pain Score 03/25/20 0647 0     Pain Loc --      Pain Edu? --      Excl. in [place]? --     Constitutional: Alert and oriented.   Nose: No congestion/rhinnorhea. Mouth/Throat: Mucous membranes are moist.   Neck:  Painless ROM Cardiovascular: Normal rate, regular rhythm. Grossly normal heart sounds.  Good peripheral circulation. Respiratory: Normal respiratory effort.  No retractions. Lungs CTAB. Gastrointestinal: Soft and nontender. No distention.  No CVA tenderness.  Musculoskeletal: No lower extremity tenderness nor edema.  Warm and well perfused Neurologic:  Normal speech and language. No gross focal neurologic deficits are appreciated.  Skin:  Skin is warm, dry and intact. No rash noted. Psychiatric: Mood and affect are normal. Speech and behavior are normal.  ____________________________________________   LABS (all labs ordered are listed, but only abnormal results are displayed)  Labs Reviewed  ACETAMINOPHEN LEVEL - Abnormal; Notable for the following components:      Result Value   Acetaminophen (Tylenol), Serum <10 (*)    All other components within normal limits  COMPREHENSIVE METABOLIC PANEL - Abnormal; Notable for the following components:   BUN 21 (*)    Total Bilirubin 1.7 (*)    All other components within normal limits  SALICYLATE LEVEL - Abnormal; Notable for the following components:   Salicylate Lvl <3.1 (*)    All other components within normal limits  CBC WITH DIFFERENTIAL/PLATELET - Abnormal; Notable for the following components:   Monocytes Absolute 1.3 (*)    All other components within normal limits  ACETAMINOPHEN LEVEL - Abnormal; Notable for the following components:   Acetaminophen (Tylenol), Serum <10 (*)    All other components within normal limits  RESP PANEL BY RT-PCR (FLU A&B, COVID) ARPGX2  ETHANOL  LIPASE, BLOOD  URINE DRUG SCREEN, QUALITATIVE ([facility] ONLY)    ____________________________________________  EKG  None ____________________________________________  RADIOLOGY  none  ____________________________________________   PROCEDURES  Procedure(s) performed: yes  .1-3 Lead EKG Interpretation Performed by: Lavonia Drafts, MD Authorized by: Lavonia Drafts, MD     Interpretation: normal     ECG rate assessment: normal     Rhythm: sinus rhythm     Ectopy: none     Conduction: normal       Critical Care performed: No ____________________________________________   INITIAL IMPRESSION / ASSESSMENT AND PLAN / ED COURSE  Pertinent labs & imaging results that were available during my care of the patient were reviewed by me and considered in my medical decision making (see chart for details).  Patient presents after intentional drug overdose.  The patient has been involuntarily committed.  Screening labs have been ordered.  Is not entirely clear what the patient took.  Nurse discussed with poison control, they have recommended  activated charcoal at this time.  We will give IV Zofran for nausea.  Lab work is quite reassuring thus far, pending Tylenol and salicylate levels   ----------------------------------------- 9:38 AM on 03/25/2020 -----------------------------------------  Lab work thus far is reassuring, patient is well, he is particularly chatty with his sitter.  Have consulted psychiatry and TTS.  We will send second acetaminophen level but initial was normal  ----------------------------------------- 2:34 PM on 03/25/2020 -----------------------------------------  Second Tylenol level is normal   The patient has been placed in psychiatric observation due to the need to provide a safe environment for the patient while obtaining psychiatric consultation and evaluation, as well as ongoing medical and medication management to treat the patient's condition.  The patient has been placed under full IVC at this  time.    ____________________________________________   FINAL CLINICAL IMPRESSION(S) / ED DIAGNOSES  Final diagnoses:  Intentional drug overdose, initial encounter [facility])        Note:  This document was prepared using Dragon voice recognition software and may include unintentional dictation errors.   Lavonia Drafts, MD 03/25/20 1434    Lavonia Drafts, MD 03/25/20 1435

## 2020-03-25 NOTE — BH Assessment (Signed)
TTS consult not complete. Patient seen by psychiatrist and psychiatrically cleared and discharged.

## 2020-03-28 ENCOUNTER — Emergency Department (HOSPITAL_COMMUNITY)
Admission: EM | Admit: 2020-03-28 | Discharge: 2020-03-29 | Disposition: A | Discharge status: Other Acute Inpatient Hospital | Payer: Medicaid Other | Attending: Emergency Medicine | Admitting: Emergency Medicine

## 2020-03-28 ENCOUNTER — Other Ambulatory Visit: Payer: Self-pay

## 2020-03-28 ENCOUNTER — Encounter (HOSPITAL_COMMUNITY): Payer: Self-pay

## 2020-03-28 ENCOUNTER — Ambulatory Visit (HOSPITAL_COMMUNITY)
Admission: RE | Admit: 2020-03-28 | Discharge: 2020-03-28 | Disposition: A | Discharge status: Other Acute Inpatient Hospital | Payer: Self-pay | Attending: Psychiatry | Admitting: Psychiatry

## 2020-03-28 DIAGNOSIS — Z20822 Contact with and (suspected) exposure to covid-19: Secondary | ICD-10-CM | POA: Insufficient documentation

## 2020-03-28 DIAGNOSIS — F17210 Nicotine dependence, cigarettes, uncomplicated: Secondary | ICD-10-CM | POA: Insufficient documentation

## 2020-03-28 DIAGNOSIS — R45851 Suicidal ideations: Secondary | ICD-10-CM | POA: Insufficient documentation

## 2020-03-28 DIAGNOSIS — F1994 Other psychoactive substance use, unspecified with psychoactive substance-induced mood disorder: Secondary | ICD-10-CM

## 2020-03-28 DIAGNOSIS — R454 Irritability and anger: Secondary | ICD-10-CM | POA: Insufficient documentation

## 2020-03-28 DIAGNOSIS — F32A Depression, unspecified: Secondary | ICD-10-CM | POA: Insufficient documentation

## 2020-03-28 DIAGNOSIS — T50902A Poisoning by unspecified drugs, medicaments and biological substances, intentional self-harm, initial encounter: Secondary | ICD-10-CM | POA: Insufficient documentation

## 2020-03-28 LAB — CBC
HCT: 44.2 % (ref 39.0–52.0)
Hemoglobin: 13.7 g/dL (ref 13.0–17.0)
MCH: 26.1 pg (ref 26.0–34.0)
MCHC: 31 g/dL (ref 30.0–36.0)
MCV: 84.2 fL (ref 80.0–100.0)
Platelets: 241 10*3/uL (ref 150–400)
RBC: 5.25 MIL/uL (ref 4.22–5.81)
RDW: 15.3 % (ref 11.5–15.5)
WBC: 6.8 10*3/uL (ref 4.0–10.5)
nRBC: 0 % (ref 0.0–0.2)

## 2020-03-28 LAB — COMPREHENSIVE METABOLIC PANEL
ALT: 14 U/L (ref 0–44)
AST: 19 U/L (ref 15–41)
Albumin: 4 g/dL (ref 3.5–5.0)
Alkaline Phosphatase: 79 U/L (ref 38–126)
Anion gap: 9 (ref 5–15)
BUN: 13 mg/dL (ref 6–20)
CO2: 24 mmol/L (ref 22–32)
Calcium: 9 mg/dL (ref 8.9–10.3)
Chloride: 105 mmol/L (ref 98–111)
Creatinine, Ser: 1.02 mg/dL (ref 0.61–1.24)
GFR, Estimated: >60 mL/min (ref >60)
Glucose, Bld: 79 mg/dL (ref 70–99)
Potassium: 4.1 mmol/L (ref 3.5–5.1)
Sodium: 138 mmol/L (ref 135–145)
Total Bilirubin: 0.7 mg/dL (ref 0.3–1.2)
Total Protein: 7.6 g/dL (ref 6.5–8.1)

## 2020-03-28 LAB — ACETAMINOPHEN LEVEL: Acetaminophen (Tylenol), Serum: <10 ug/mL — ABNORMAL LOW (ref 10–30)

## 2020-03-28 LAB — RAPID URINE DRUG SCREEN, HOSP PERFORMED
Amphetamines: NOT DETECTED
Barbiturates: NOT DETECTED
Benzodiazepines: NOT DETECTED
Cocaine: POSITIVE — AB
Opiates: NOT DETECTED
Tetrahydrocannabinol: NOT DETECTED

## 2020-03-28 LAB — ETHANOL: Alcohol, Ethyl (B): <10 mg/dL (ref <10)

## 2020-03-28 LAB — SALICYLATE LEVEL: Salicylate Lvl: <7 mg/dL — ABNORMAL LOW (ref 7.0–30.0)

## 2020-03-28 NOTE — ED Triage Notes (Signed)
Brought in by EMS for SI/HI thoughts, took - unknown pills an hour and half ago, Left side CP states depressed because wife died  Months ago, auditory hallucinations

## 2020-03-28 NOTE — BH Assessment (Signed)
Discussed case with Trinna Post, PA-C who recommended Pt be transferred to [facility] for continuous assessment after Pt is medically cleared. Notified Dr. Lajean Saver and Laddie Aquas, RN of recommendation. Notified staff at [facility] of pending transfer.   Evelena Peat, [facility], [facility] Triage Specialist [phone]

## 2020-03-28 NOTE — H&P (Signed)
Behavioral Health Medical Screening Exam   Samuel Molina is an 59 y.o. male. Patient presented to [facility] as walk-in with c/o suicidal ideations. Patient notified nursing staff he had ingested "a bunch of pills" prior to coming; EMS called. Patient to be transferred to nearest ED for medical clearance.  Patient has had 14 ED encounters at [facility] since 10/20/2019. Most recent 03/25/2020 for "Intentional drug overdose".   Total Time spent with patient: 15 minutes  Psychiatric Specialty Exam: Physical Exam Vitals and nursing note reviewed.  Psychiatric:        Attention and Perception: Attention and perception normal.        Mood and Affect: Mood and affect normal.        Speech: Speech normal.        Behavior: Behavior normal. Behavior is cooperative.        Thought Content: Thought content includes suicidal ideation.        Cognition and Memory: Cognition normal.        Judgment: Judgment is impulsive.    Review of Systems  Psychiatric/Behavioral: Positive for self-injury.   Blood pressure 117/68, pulse 73, temperature 98.7 F (37.1 C), temperature source Oral, resp. rate 18.There is no height or weight on file to calculate BMI. General Appearance: Casual Eye Contact:  Fair Speech:  Clear and Coherent Volume:  Normal Mood:  Euthymic Affect:  Congruent Thought Process:  Coherent Orientation:  Full (Time, Place, and Person) Thought Content:  WDL Suicidal Thoughts:  Yes.  with intent/plan Homicidal Thoughts:  No Memory:  Immediate;   Fair Recent;   Fair Remote;   Fair Judgement:  Intact Insight:  Shallow Psychomotor Activity:  Normal Concentration: Concentration: Good and Attention Span: Good Recall:  Good Fund of Knowledge:Good Language: Good Akathisia:  NA Handed:  Right AIMS (if indicated):    Assets:  Communication Skills Physical Health Resilience Sleep:     Musculoskeletal: Strength & Muscle Tone: within normal limits Gait & Station: normal Patient leans:  N/A  Blood pressure 117/68, pulse 73, temperature 98.7 F (37.1 C), temperature source Oral, resp. rate 18.  Recommendations: Based on my evaluation the patient appears to have an emergency medical condition for which I recommend the patient be transferred to the emergency department for further evaluation.  EMS called and patient transferred to nearest ED for medical clearance after reporting he ingested "a bunch of pills" prior to arrival.   Inda Merlin, NP 03/28/2020, 7:24 PM
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
## 2020-03-28 NOTE — Discharge Instructions (Addendum)
It was our pleasure to provide your ER care today - we hope that you feel better.  We discussed your case with our behavioral health team and they are recommended we transfer you to the behavioral health urgent care for further behavioral health evaluation and treatment.

## 2020-03-28 NOTE — ED Provider Notes (Addendum)
[place] DEPT Provider Note   CSN: [id number] Arrival date & time: 03/28/20  1940     History Chief Complaint  Patient presents with  . Suicidal    Samuel Molina is a 59 y.o. male.  Patient with hx substance abuse, presents via EMS, pt indicates he may have take a few pills 2-3 hours ago, and that he has intermittent feelings of depression and SI. Notes hx same. Patient indicates he was frustrated/upset earlier, but that currently he is not having any persistent thoughts of suicide or self harm. Denies specific stressor or inciting event. On arrival is asking for something to eat/drink. Denies daily or heavy etoh use. Denies acute physical c/o. No headache. No chest pain or sob. No abd pain or nvd. Denies recent trauma or fall. Normal appetite. No fever or chills.   The history is provided by the patient and the EMS personnel.       Past Medical History:  Diagnosis Date  . Anxiety   . Depression   . GERD (gastroesophageal reflux disease)   . Obesity   . Substance abuse [facility])     Patient Active Problem List   Diagnosis Date Noted  . Malingering 03/25/2020  . Personality disorder ([place]) 02/13/2020  . Bursitis 02/13/2020  . Substance induced mood disorder ([place]) 02/12/2020  . Severe recurrent major depression without psychotic features ([place]) 01/21/2020  . Cocaine abuse ([place]) 01/21/2020  . Hip pain 01/21/2020  . Major depression 01/20/2020  . Gastroesophageal reflux disease with esophagitis   . Morbid obesity ([place]) 09/19/2017  . Arthritis of knee 11/03/2015    Past Surgical History:  Procedure Laterality Date  . ESOPHAGOGASTRODUODENOSCOPY (EGD) WITH PROPOFOL N/A 09/20/2017   Procedure: ESOPHAGOGASTRODUODENOSCOPY (EGD) WITH PROPOFOL;  Surgeon: Lin Landsman, MD;  Location: [place];  Service: Gastroenterology;  Laterality: N/A;  . KNEE SURGERY Right        History reviewed. No pertinent family history.  Social History    Tobacco Use  . Smoking status: Current Every Day Smoker    Packs/day: 0.50    Types: Cigarettes  . Smokeless tobacco: Never Used  Vaping Use  . Vaping Use: Never used  Substance Use Topics  . Alcohol use: Not Currently  . Drug use: Yes    Types: Cocaine    Home Medications Prior to Admission medications   Medication Sig Start Date End Date Taking? Authorizing Provider  citalopram (CELEXA) 10 MG tablet Take 1 tablet (10 mg total) by mouth daily. 02/24/20   [name], MD  meloxicam (MOBIC) 15 MG tablet Take 1 tablet (15 mg total) by mouth daily. 02/25/20   [name], MD  methocarbamol (ROBAXIN) 500 MG tablet Take 1 tablet (500 mg total) by mouth every 8 (eight) hours as needed for muscle spasms. 03/02/20   Vladimir Crofts, MD  naproxen (NAPROSYN) 500 MG tablet Take 1 tablet (500 mg total) by mouth 2 (two) times daily with a meal. 02/03/20   Clapacs, [name], MD  pantoprazole (PROTONIX) 40 MG tablet Take 2 tablets (80 mg total) by mouth daily. 02/04/20   Clapacs, [name], MD  QUEtiapine (SEROQUEL) 300 MG tablet Take 1 tablet (300 mg total) by mouth at bedtime. 02/24/20   [name], MD  omeprazole (PRILOSEC OTC) 20 MG tablet Take 2 tablets (40 mg total) by mouth 2 (two) times daily before a meal. 09/19/17 01/29/19  Lin Landsman, MD    Allergies    Acetaminophen  Review  of Systems   Review of Systems  Constitutional: Negative for chills and fever.  HENT: Negative for sore throat.   Eyes: Negative for redness.  Respiratory: Negative for cough and shortness of breath.   Cardiovascular: Negative for chest pain.  Gastrointestinal: Negative for abdominal pain, nausea and vomiting.  Genitourinary: Negative for flank pain.  Musculoskeletal: Negative for back pain and neck pain.  Skin: Negative for rash.  Neurological: Negative for headaches.  Hematological: Does not bruise/bleed easily.  Psychiatric/Behavioral: Positive for dysphoric mood.    Physical  Exam Updated Vital Signs BP (!) 156/78 (BP Location: Right Arm)   Pulse 62   Temp 98 F (36.7 C) (Oral)   Resp 20   Ht 1.854 m (6\' 1" )   Wt 98.4 kg   SpO2 98%   BMI 28.63 kg/m   Physical Exam Vitals and nursing note reviewed.  Constitutional:      Appearance: Normal appearance. He is well-developed.  HENT:     Head: Atraumatic.     Nose: Nose normal.     Mouth/Throat:     Mouth: Mucous membranes are moist.     Pharynx: Oropharynx is clear.  Eyes:     General: No scleral icterus.    Conjunctiva/sclera: Conjunctivae normal.     Pupils: Pupils are equal, round, and reactive to light.  Neck:     Trachea: No tracheal deviation.  Cardiovascular:     Rate and Rhythm: Normal rate and regular rhythm.     Pulses: Normal pulses.     Heart sounds: Normal heart sounds. No murmur heard.  No friction rub. No gallop.   Pulmonary:     Effort: Pulmonary effort is normal. No accessory muscle usage or respiratory distress.     Breath sounds: Normal breath sounds.  Abdominal:     General: Bowel sounds are normal. There is no distension.     Palpations: Abdomen is soft.     Tenderness: There is no abdominal tenderness. There is no guarding.  Genitourinary:    Comments: No cva tenderness. Musculoskeletal:        General: No swelling or tenderness.     Cervical back: Normal range of motion and neck supple. No rigidity.  Skin:    General: Skin is warm and dry.     Findings: No rash.  Neurological:     Mental Status: He is alert.     Comments: Alert, speech clear. Motor/sens grossly intact bil. Steady gait.   Psychiatric:        Mood and Affect: Mood normal.     ED Results / Procedures / Treatments   Labs (all labs ordered are listed, but only abnormal results are displayed) Results for orders placed or performed during the hospital encounter of 03/28/20  CBC  Result Value Ref Range   WBC 6.8 4.0 - 10.5 K/uL   RBC 5.25 4.22 - 5.81 MIL/uL   Hemoglobin 13.7 13.0 - 17.0 g/dL    HCT 44.2 39 - 52 %   MCV 84.2 80.0 - 100.0 fL   MCH 26.1 26.0 - 34.0 pg   MCHC 31.0 30.0 - 36.0 g/dL   RDW 15.3 11.5 - 15.5 %   Platelets 241 150 - 400 K/uL   nRBC 0.0 0.0 - 0.2 %  Comprehensive metabolic panel  Result Value Ref Range   Sodium 138 135 - 145 mmol/L   Potassium 4.1 3.5 - 5.1 mmol/L   Chloride 105 98 - 111 mmol/L   CO2 24 22 -  32 mmol/L   Glucose, Bld 79 70 - 99 mg/dL   BUN 13 6 - 20 mg/dL   Creatinine, Ser 1.02 0.61 - 1.24 mg/dL   Calcium 9.0 8.9 - 10.3 mg/dL   Total Protein 7.6 6.5 - 8.1 g/dL   Albumin 4.0 3.5 - 5.0 g/dL   AST 19 15 - 41 U/L   ALT 14 0 - 44 U/L   Alkaline Phosphatase 79 38 - 126 U/L   Total Bilirubin 0.7 0.3 - 1.2 mg/dL   GFR, Estimated >60 >60 mL/min   Anion gap 9 5 - 15  Ethanol  Result Value Ref Range   Alcohol, Ethyl (B) <10 <10 mg/dL  Rapid urine drug screen (hospital performed)  Result Value Ref Range   Opiates NONE DETECTED NONE DETECTED   Cocaine POSITIVE (A) NONE DETECTED   Benzodiazepines NONE DETECTED NONE DETECTED   Amphetamines NONE DETECTED NONE DETECTED   Tetrahydrocannabinol NONE DETECTED NONE DETECTED   Barbiturates NONE DETECTED NONE DETECTED  Salicylate level  Result Value Ref Range   Salicylate Lvl <4.7 (L) 7.0 - 30.0 mg/dL  Acetaminophen level  Result Value Ref Range   Acetaminophen (Tylenol), Serum <10 (L) 10 - 30 ug/mL   EKG None  Radiology No results found.  Procedures Procedures (including critical care time)  Medications Ordered in ED Medications - No data to display  ED Course  I have reviewed the triage vital signs and the nursing notes.  Pertinent labs & imaging results that were available during my care of the patient were reviewed by me and considered in my medical decision making (see chart for details).    MDM Rules/Calculators/A&P                         Labs sent. Continuous pulse ox and cardiac monitoring.   Reviewed nursing notes and prior charts for additional history.  Hx similar  presentations in past several months - was recently psych cleared.   Labs reviewed/interpreted by me - chem normal. Acetaminophen/salicylate neg.   Pt asking for food/drink - provided.   Recheck pt, alert, content, watching tv. Await BH eval.  The patient has been placed in psychiatric observation due to the need to provide a safe environment for the patient while obtaining psychiatric consultation and evaluation, as well as ongoing medical and medication management to treat the patient's condition.  The patient has not been placed under full IVC at this time.  Disposition per [facility] team.   [place] team recommends transfer to [facility] for further behavioral health observation, evaluation and tx.     Pt appear stable for movement to [place].   Final Clinical Impression(s) / ED Diagnoses Final diagnoses:  None    Rx / DC Orders ED Discharge Orders    None            Lajean Saver, MD 03/28/20 2351

## 2020-03-29 ENCOUNTER — Encounter (HOSPITAL_COMMUNITY): Payer: Self-pay | Admitting: Emergency Medicine

## 2020-03-29 ENCOUNTER — Other Ambulatory Visit: Payer: Self-pay

## 2020-03-29 ENCOUNTER — Ambulatory Visit (HOSPITAL_COMMUNITY)
Admission: EM | Admit: 2020-03-29 | Discharge: 2020-03-30 | Disposition: A | Discharge status: Home / Self Care | Payer: No Payment, Other | Attending: Registered Nurse | Admitting: Registered Nurse

## 2020-03-29 DIAGNOSIS — Z765 Malingerer [conscious simulation]: Secondary | ICD-10-CM

## 2020-03-29 DIAGNOSIS — Z634 Disappearance and death of family member: Secondary | ICD-10-CM | POA: Insufficient documentation

## 2020-03-29 DIAGNOSIS — F332 Major depressive disorder, recurrent severe without psychotic features: Secondary | ICD-10-CM

## 2020-03-29 DIAGNOSIS — F609 Personality disorder, unspecified: Secondary | ICD-10-CM | POA: Diagnosis present

## 2020-03-29 DIAGNOSIS — R45851 Suicidal ideations: Secondary | ICD-10-CM

## 2020-03-29 DIAGNOSIS — R440 Auditory hallucinations: Secondary | ICD-10-CM

## 2020-03-29 DIAGNOSIS — F17210 Nicotine dependence, cigarettes, uncomplicated: Secondary | ICD-10-CM | POA: Insufficient documentation

## 2020-03-29 DIAGNOSIS — F1914 Other psychoactive substance abuse with psychoactive substance-induced mood disorder: Secondary | ICD-10-CM | POA: Insufficient documentation

## 2020-03-29 DIAGNOSIS — F1994 Other psychoactive substance use, unspecified with psychoactive substance-induced mood disorder: Secondary | ICD-10-CM

## 2020-03-29 DIAGNOSIS — F1410 Cocaine abuse, uncomplicated: Secondary | ICD-10-CM

## 2020-03-29 LAB — RESP PANEL BY RT-PCR (FLU A&B, COVID) ARPGX2
Influenza A by PCR: NEGATIVE
Influenza B by PCR: NEGATIVE
SARS Coronavirus 2 by RT PCR: NEGATIVE

## 2020-03-29 MED ORDER — MELOXICAM 7.5 MG PO TABS
15.0000 mg | ORAL_TABLET | Freq: Every day | ORAL | Status: DC
  Administered 2020-03-29 – 2020-03-30 (×2): 15 mg via ORAL
  Filled 2020-03-29 (×2): qty 2

## 2020-03-29 MED ORDER — QUETIAPINE FUMARATE 25 MG PO TABS
25.0000 mg | ORAL_TABLET | Freq: Two times a day (BID) | ORAL | Status: DC
  Administered 2020-03-29 – 2020-03-30 (×2): 25 mg via ORAL
  Filled 2020-03-29 (×3): qty 1

## 2020-03-29 MED ORDER — QUETIAPINE FUMARATE 300 MG PO TABS
300.0000 mg | ORAL_TABLET | Freq: Every day | ORAL | Status: DC
  Administered 2020-03-29: 300 mg via ORAL
  Filled 2020-03-29: qty 1

## 2020-03-29 MED ORDER — PANTOPRAZOLE SODIUM 40 MG PO TBEC
80.0000 mg | DELAYED_RELEASE_TABLET | Freq: Every day | ORAL | Status: DC
  Administered 2020-03-29 – 2020-03-30 (×2): 80 mg via ORAL
  Filled 2020-03-29: qty 2
  Filled 2020-03-29: qty 4

## 2020-03-29 NOTE — ED Notes (Signed)
Called to [facility] and nurse is busy and will call back for report

## 2020-03-29 NOTE — ED Triage Notes (Signed)
[facility] transfer, pt presents with SI and depression, reports he took an unknown amount of pills earlier tonight. Denies HI or AVH.

## 2020-03-29 NOTE — ED Provider Notes (Signed)
Behavioral Health Admission [facility])  Date: 03/29/20 Patient Name: Samuel Molina MRN: [id number] Chief Complaint:  Chief Complaint  Patient presents with  . Suicidal      Diagnoses:  Final diagnoses:  None    HPI:  Samuel Molina is a 59 year old male with a past psychiatric history significant for major depressive disorder, polysubstance abuse disorder, and personality disorder who presented to [facility] as a walk-in due to suicide ideations. During his visit, patient notified nursing staff that he had ingested a bunch of pills before coming in. EMS was promptly called and the patient was taken to [facility] ED. During his assessment at [facility] ED, patient reported that he was frustrated earlier and was now not having any persistent thoughts of suicide or self harm. Patient was medically cleared and transferred to [facility].  Upon arrival to [facility], patient was anxious and easily agitated. During the assessment, patient showed no interest in answering questions stating "I am aggravated as fuck and don't want to be answering questions that I have already answered at the hospital." Patient endorses suicide and homicide ideations. When asked to elaborate on his homicide ideations, he stated that he had broken someone's shoulder. Patient was asked if he knew the person but and he replied, "I don't know who, just know that I did it." Patient denies both auditory and visual hallucinations, however, he stated that he could here people talking to him.  PHQ 2-9:     ED from 03/28/2020 in [place] DEPT Admission (Discharged) from 02/16/2020 in [place] ED from 02/14/2020 in [place] CATEGORY Error: Q2 is Yes, you must answer 3, 4, and 5 Error: Q7 should not be populated when Q6 is No High Risk       Total  Time spent with patient: 15 minutes  Musculoskeletal  Strength & Muscle Tone: within normal limits Gait & Station: normal Patient leans: N/A  Psychiatric Specialty Exam  Presentation General Appearance: Appropriate for Environment;Fairly Groomed  Eye Contact:Good  Speech:Clear and Coherent;Normal Rate  Speech Volume:Normal  Handedness:Right   Mood and Affect  Mood:Dysphoric;Irritable;Anxious  Affect:Congruent   Thought Process  Thought Processes:Goal Directed;Coherent  Descriptions of Associations:Intact  Orientation:Full (Time, Place and Person)  Thought Content:WDL  Hallucinations:Hallucinations: Auditory Description of Auditory Hallucinations: Patient states that he hears people talking to him  Ideas of Reference:None  Suicidal Thoughts:Suicidal Thoughts: Yes, Passive SI Passive Intent and/or Plan: Without Intent;Without Plan  Homicidal Thoughts:Homicidal Thoughts: Yes, Passive (Patient reports that he broke another persons shoulder) HI Passive Intent and/or Plan: Without Intent;Without Plan   Sensorium  Memory:Immediate Fair;Recent Fair  Judgment:Fair  Insight:Fair   Executive Functions  Concentration:Fair  Attention Span:Good  [place]  Language:Good   Psychomotor Activity  Psychomotor Activity:Psychomotor Activity: Restlessness   Assets  Assets:Communication Skills;Desire for Improvement   Sleep  Sleep:No data recorded  Physical Exam Constitutional:      Appearance: Normal appearance.  HENT:     Head: Normocephalic and atraumatic.     Nose: Nose normal.  Eyes:     Extraocular Movements: Extraocular movements intact.     Pupils: Pupils are equal, round, and reactive to light.  Cardiovascular:     Rate and Rhythm: Normal rate and regular rhythm.  Pulmonary:     Effort: Pulmonary effort is normal.     Breath sounds: Normal breath sounds.  Musculoskeletal:  General: Normal range of motion.      Cervical back: Normal range of motion and neck supple.  Skin:    General: Skin is warm and dry.  Neurological:     General: No focal deficit present.     Mental Status: He is alert.  Psychiatric:        Attention and Perception: Attention normal. He perceives auditory hallucinations. He does not perceive visual hallucinations.        Mood and Affect: Mood is anxious. Affect is angry.        Speech: Speech normal.        Behavior: Behavior is agitated. Behavior is cooperative.        Thought Content: Thought content includes homicidal and suicidal ideation.        Judgment: Judgment is inappropriate.    Review of Systems  Constitutional: Negative.   HENT: Negative.   Eyes: Negative.   Respiratory: Negative.   Cardiovascular: Negative.   Gastrointestinal: Negative.   Musculoskeletal: Negative.   Skin: Negative.   Neurological: Negative.   Psychiatric/Behavioral: Positive for hallucinations and suicidal ideas.    Blood pressure 129/69, pulse (!) 55, temperature 97.8 F (36.6 C), temperature source Temporal, resp. rate 18, SpO2 100 %. There is no height or weight on file to calculate BMI.  Past Psychiatric History: Major depressive disorder Personality DIsorder  Is the patient at risk to self? Yes  Has the patient been a risk to self in the past 6 months? Yes .    Has the patient been a risk to self within the distant past? No   Is the patient a risk to others? No   Has the patient been a risk to others in the past 6 months? No   Has the patient been a risk to others within the distant past? No   Past Medical History:  Past Medical History:  Diagnosis Date  . Anxiety   . Depression   . GERD (gastroesophageal reflux disease)   . Obesity   . Substance abuse [facility])     Past Surgical History:  Procedure Laterality Date  . ESOPHAGOGASTRODUODENOSCOPY (EGD) WITH PROPOFOL N/A 09/20/2017   Procedure: ESOPHAGOGASTRODUODENOSCOPY (EGD) WITH PROPOFOL;  Surgeon: Lin Landsman, MD;  Location: [place];  Service: Gastroenterology;  Laterality: N/A;  . KNEE SURGERY Right     Family History: History reviewed. No pertinent family history.  Social History:  Social History   Socioeconomic History  . Marital status: Single    Spouse name: Not on file  . Number of children: Not on file  . Years of education: Not on file  . Highest education level: Not on file  Occupational History  . Not on file  Tobacco Use  . Smoking status: Current Every Day Smoker    Packs/day: 0.50    Types: Cigarettes  . Smokeless tobacco: Never Used  Vaping Use  . Vaping Use: Never used  Substance and Sexual Activity  . Alcohol use: Not Currently  . Drug use: Yes    Types: Cocaine  . Sexual activity: Not on file  Other Topics Concern  . Not on file  Social History Narrative  . Not on file   Social Determinants of Health   Financial Resource Strain:   . Difficulty of Paying Living Expenses: Not on file  Food Insecurity:   . Worried About Charity fundraiser in the Last Year: Not on file  . Ran Out of Food in the Last Year:  Not on file  Transportation Needs:   . Lack of Transportation (Medical): Not on file  . Lack of Transportation (Non-Medical): Not on file  Physical Activity:   . Days of Exercise per Week: Not on file  . Minutes of Exercise per Session: Not on file  Stress:   . Feeling of Stress : Not on file  Social Connections:   . Frequency of Communication with Friends and Family: Not on file  . Frequency of Social Gatherings with Friends and Family: Not on file  . Attends Religious Services: Not on file  . Active Member of Clubs or Organizations: Not on file  . Attends Archivist Meetings: Not on file  . Marital Status: Not on file  Intimate Partner Violence:   . Fear of Current or Ex-Partner: Not on file  . Emotionally Abused: Not on file  . Physically Abused: Not on file  . Sexually Abused: Not on file    SDOH:  SDOH Screenings    Alcohol Screen: Low Risk   . Last Alcohol Screening Score (AUDIT): 1  Depression (PHQ2-9):   . PHQ-2 Score: Not on file  Financial Resource Strain:   . Difficulty of Paying Living Expenses: Not on file  Food Insecurity:   . Worried About Charity fundraiser in the Last Year: Not on file  . Ran Out of Food in the Last Year: Not on file  Housing:   . Last Housing Risk Score: Not on file  Physical Activity:   . Days of Exercise per Week: Not on file  . Minutes of Exercise per Session: Not on file  Social Connections:   . Frequency of Communication with Friends and Family: Not on file  . Frequency of Social Gatherings with Friends and Family: Not on file  . Attends Religious Services: Not on file  . Active Member of Clubs or Organizations: Not on file  . Attends Archivist Meetings: Not on file  . Marital Status: Not on file  Stress:   . Feeling of Stress : Not on file  Tobacco Use: High Risk  . Smoking Tobacco Use: Current Every Day Smoker  . Smokeless Tobacco Use: Never Used  Transportation Needs:   . Film/video editor (Medical): Not on file  . Lack of Transportation (Non-Medical): Not on file    Last Labs:  Admission on 03/28/2020, Discharged on 03/29/2020  Component Date Value Ref Range Status  . WBC 03/28/2020 6.8  4.0 - 10.5 K/uL Final  . RBC 03/28/2020 5.25  4.22 - 5.81 MIL/uL Final  . Hemoglobin 03/28/2020 13.7  13.0 - 17.0 g/dL Final  . HCT 03/28/2020 44.2  39 - 52 % Final  . MCV 03/28/2020 84.2  80.0 - 100.0 fL Final  . MCH 03/28/2020 26.1  26.0 - 34.0 pg Final  . MCHC 03/28/2020 31.0  30.0 - 36.0 g/dL Final  . RDW 03/28/2020 15.3  11.5 - 15.5 % Final  . Platelets 03/28/2020 241  150 - 400 K/uL Final  . nRBC 03/28/2020 0.0  0.0 - 0.2 % Final   Performed at [facility], [place] [address] [place], [place] [address]  . Sodium 03/28/2020 138  135 - 145 mmol/L Final  . Potassium 03/28/2020 4.1  3.5 - 5.1 mmol/L Final  . Chloride  03/28/2020 105  98 - 111 mmol/L Final  . CO2 03/28/2020 24  22 - 32 mmol/L Final  . Glucose, Bld 03/28/2020 79  70 - 99 mg/dL Final  Glucose reference range applies only to samples taken after fasting for at least 8 hours.  . BUN 03/28/2020 13  6 - 20 mg/dL Final  . Creatinine, Ser 03/28/2020 1.02  0.61 - 1.24 mg/dL Final  . Calcium 03/28/2020 9.0  8.9 - 10.3 mg/dL Final  . Total Protein 03/28/2020 7.6  6.5 - 8.1 g/dL Final  . Albumin 03/28/2020 4.0  3.5 - 5.0 g/dL Final  . AST 03/28/2020 19  15 - 41 U/L Final  . ALT 03/28/2020 14  0 - 44 U/L Final  . Alkaline Phosphatase 03/28/2020 79  38 - 126 U/L Final  . Total Bilirubin 03/28/2020 0.7  0.3 - 1.2 mg/dL Final  . GFR, Estimated 03/28/2020 >60  >60 mL/min Final   Comment: (NOTE) Calculated using the CKD-EPI Creatinine Equation (2021)   . Anion gap 03/28/2020 9  5 - 15 Final   Performed at [facility], [place] [address] [place], [place] [address]  . Alcohol, Ethyl (B) 03/28/2020 <10  <10 mg/dL Final   Comment: (NOTE) Lowest detectable limit for serum alcohol is 10 mg/dL.  For medical purposes only. Performed at [facility], [place] [address], [place], [place] [address]   . Opiates 03/28/2020 NONE DETECTED  NONE DETECTED Final  . Cocaine 03/28/2020 POSITIVE* NONE DETECTED Final  . Benzodiazepines 03/28/2020 NONE DETECTED  NONE DETECTED Final  . Amphetamines 03/28/2020 NONE DETECTED  NONE DETECTED Final  . Tetrahydrocannabinol 03/28/2020 NONE DETECTED  NONE DETECTED Final  . Barbiturates 03/28/2020 NONE DETECTED  NONE DETECTED Final   Comment: (NOTE) DRUG SCREEN FOR MEDICAL PURPOSES ONLY.  IF CONFIRMATION IS NEEDED FOR ANY PURPOSE, NOTIFY LAB WITHIN 5 DAYS.  LOWEST DETECTABLE LIMITS FOR URINE DRUG SCREEN Drug Class                     Cutoff (ng/mL) Amphetamine and metabolites    1000 Barbiturate and metabolites    200 Benzodiazepine                 638 Tricyclics and metabolites      300 Opiates and metabolites        300 Cocaine and metabolites        300 THC                            50 Performed at [facility], [place] [address], [place], [place] [address]   . Salicylate Lvl 93/57/0177 <7.0* 7.0 - 30.0 mg/dL Final   Performed at [place] [address] [place], [place] [address]  . Acetaminophen (Tylenol), Serum 03/28/2020 <10* 10 - 30 ug/mL Final   Comment: (NOTE) Therapeutic concentrations vary significantly. A range of 10-30 ug/mL  may be an effective concentration for many patients. However, some  are best treated at concentrations outside of this range. Acetaminophen concentrations >150 ug/mL at 4 hours after ingestion  and >50 ug/mL at 12 hours after ingestion are often associated with  toxic reactions.  Performed at [facility], [place] [address], [place], [place] [address]   . SARS Coronavirus 2 by RT PCR 03/28/2020 NEGATIVE  NEGATIVE Final   Comment: (NOTE) SARS-CoV-2 target nucleic acids are NOT DETECTED.  The SARS-CoV-2 RNA is generally detectable in upper respiratory specimens during the acute phase of infection. The lowest concentration of SARS-CoV-2 viral copies this assay can detect is 138 copies/mL. A negative result does not  preclude SARS-Cov-2 infection and should not be used as the sole basis for treatment or other patient management decisions. A negative result may occur with  improper specimen collection/handling, submission of specimen other than nasopharyngeal swab, presence of viral mutation(s) within the areas targeted by this assay, and inadequate number of viral copies(<138 copies/mL). A negative result must be combined with clinical observations, patient history, and epidemiological information. The expected result is Negative.  Fact Sheet for Patients:  EntrepreneurPulse.com.au  Fact Sheet for Healthcare Providers:   IncredibleEmployment.be  This test is no                          t yet approved or cleared by the [place] FDA and  has been authorized for detection and/or diagnosis of SARS-CoV-2 by FDA under an Emergency Use Authorization (EUA). This EUA will remain  in effect (meaning this test can be used) for the duration of the COVID-19 declaration under Section 564(b)(1) of the Act, 21 U.S.C.section 360bbb-3(b)(1), unless the authorization is terminated  or revoked sooner.      . Influenza A by PCR 03/28/2020 NEGATIVE  NEGATIVE Final  . Influenza B by PCR 03/28/2020 NEGATIVE  NEGATIVE Final   Comment: (NOTE) The Xpert Xpress SARS-CoV-2/FLU/RSV plus assay is intended as an aid in the diagnosis of influenza from Nasopharyngeal swab specimens and should not be used as a sole basis for treatment. Nasal washings and aspirates are unacceptable for Xpert Xpress SARS-CoV-2/FLU/RSV testing.  Fact Sheet for Patients: EntrepreneurPulse.com.au  Fact Sheet for Healthcare Providers: IncredibleEmployment.be  This test is not yet approved or cleared by the [place] FDA and has been authorized for detection and/or diagnosis of SARS-CoV-2 by FDA under an Emergency Use Authorization (EUA). This EUA will remain in effect (meaning this test can be used) for the duration of the COVID-19 declaration under Section 564(b)(1) of the Act, 21 U.S.C. section 360bbb-3(b)(1), unless the authorization is terminated or revoked.  Performed at [facility], [place] [address] [place], [place] [address]   Admission on 03/25/2020, Discharged on 03/26/2020  Component Date Value Ref Range Status  . Acetaminophen (Tylenol), Serum 03/25/2020 <10* 10 - 30 ug/mL Final   Comment: (NOTE) Therapeutic concentrations vary significantly. A range of 10-30 ug/mL  may be an effective concentration for many patients. However, some  are best treated at  concentrations outside of this range. Acetaminophen concentrations >150 ug/mL at 4 hours after ingestion  and >50 ug/mL at 12 hours after ingestion are often associated with  toxic reactions.  Performed at [facility], [address] [place], [place] [address]   . Sodium 03/25/2020 139  135 - 145 mmol/L Final  . Potassium 03/25/2020 3.7  3.5 - 5.1 mmol/L Final  . Chloride 03/25/2020 103  98 - 111 mmol/L Final  . CO2 03/25/2020 22  22 - 32 mmol/L Final  . Glucose, Bld 03/25/2020 97  70 - 99 mg/dL Final   Glucose reference range applies only to samples taken after fasting for at least 8 hours.  . BUN 03/25/2020 21* 6 - 20 mg/dL Final  . Creatinine, Ser 03/25/2020 1.17  0.61 - 1.24 mg/dL Final  . Calcium 03/25/2020 9.5  8.9 - 10.3 mg/dL Final  . Total Protein 03/25/2020 8.1  6.5 - 8.1 g/dL Final  . Albumin 03/25/2020 4.4  3.5 - 5.0 g/dL Final  . AST 03/25/2020 34  15 - 41 U/L Final  . ALT 03/25/2020 16  0 - 44 U/L Final  . Alkaline Phosphatase 03/25/2020 78  38 - 126 U/L Final  . Total Bilirubin 03/25/2020 1.7* 0.3 - 1.2 mg/dL Final  . GFR, Estimated 03/25/2020 >60  >60 mL/min Final   Comment: (NOTE) Calculated using the CKD-EPI Creatinine Equation (2021)   . Anion gap 03/25/2020 14  5 - 15 Final   Performed at [facility], [place] [place], [place] [address]  . Alcohol, Ethyl (B) 03/25/2020 <10  <10 mg/dL Final   Comment: (NOTE) Lowest detectable limit for serum alcohol is 10 mg/dL.  For medical purposes only. Performed at [facility], [address] [place], [place] [address]   . Lipase 03/25/2020 28  11 - 51 U/L Final   Performed at [facility], [place] [place], [place] [address]  . Salicylate Lvl 46/96/2952 <7.0* 7.0 - 30.0 mg/dL Final   Performed at [facility], [address] [place], [place] [address]  . WBC 03/25/2020 8.0  4.0 - 10.5 K/uL Final  . RBC 03/25/2020 5.27  4.22 - 5.81 MIL/uL Final  .  Hemoglobin 03/25/2020 13.8  13.0 - 17.0 g/dL Final  . HCT 03/25/2020 42.8  39 - 52 % Final  . MCV 03/25/2020 81.2  80.0 - 100.0 fL Final  . MCH 03/25/2020 26.2  26.0 - 34.0 pg Final  . MCHC 03/25/2020 32.2  30.0 - 36.0 g/dL Final  . RDW 03/25/2020 14.7  11.5 - 15.5 % Final  . Platelets 03/25/2020 292  150 - 400 K/uL Final  . nRBC 03/25/2020 0.0  0.0 - 0.2 % Final  . Neutrophils Relative % 03/25/2020 39  % Final  . Neutro Abs 03/25/2020 3.1  1.7 - 7.7 K/uL Final  . Lymphocytes Relative 03/25/2020 42  % Final  . Lymphs Abs 03/25/2020 3.3  0.7 - 4.0 K/uL Final  . Monocytes Relative 03/25/2020 16  % Final  . Monocytes Absolute 03/25/2020 1.3* 0.1 - 1.0 K/uL Final  . Eosinophils Relative 03/25/2020 2  % Final  . Eosinophils Absolute 03/25/2020 0.2  0.0 - 0.5 K/uL Final  . Basophils Relative 03/25/2020 1  % Final  . Basophils Absolute 03/25/2020 0.1  0.0 - 0.1 K/uL Final  . Immature Granulocytes 03/25/2020 0  % Final  . Abs Immature Granulocytes 03/25/2020 0.02  0.00 - 0.07 K/uL Final   Performed at [facility], [address], [place],  [address]  . Tricyclic, Ur Screen 27/25/3664 NONE DETECTED  NONE DETECTED Final  . Amphetamines, Ur Screen 03/25/2020 NONE DETECTED  NONE DETECTED Final  . MDMA (Ecstasy)Ur Screen 03/25/2020 NONE DETECTED  NONE DETECTED Final  . Cocaine Metabolite,Ur [place] 03/25/2020 POSITIVE* NONE DETECTED Final  . Opiate, Ur Screen 03/25/2020 NONE DETECTED  NONE DETECTED Final  . Phencyclidine (PCP) Ur S 03/25/2020 NONE DETECTED  NONE DETECTED Final  . Cannabinoid 50 Ng, Ur [place] 03/25/2020 NONE DETECTED  NONE DETECTED Final  . Barbiturates, Ur Screen 03/25/2020 NONE DETECTED  NONE DETECTED Final  . Benzodiazepine, Ur Scrn 03/25/2020 NONE DETECTED  NONE DETECTED Final  . Methadone Scn, Ur 03/25/2020 NONE DETECTED  NONE DETECTED Final   Comment: (NOTE) Tricyclics + metabolites, urine    Cutoff 1000 ng/mL Amphetamines + metabolites, urine  Cutoff 1000  ng/mL MDMA (Ecstasy), urine              Cutoff 500 ng/mL Cocaine Metabolite, urine          Cutoff 300 ng/mL Opiate + metabolites,  urine        Cutoff 300 ng/mL Phencyclidine (PCP), urine         Cutoff 25 ng/mL Cannabinoid, urine                 Cutoff 50 ng/mL Barbiturates + metabolites, urine  Cutoff 200 ng/mL Benzodiazepine, urine              Cutoff 200 ng/mL Methadone, urine                   Cutoff 300 ng/mL  The urine drug screen provides only a preliminary, unconfirmed analytical test result and should not be used for non-medical purposes. Clinical consideration and professional judgment should be applied to any positive drug screen result due to possible interfering substances. A more specific alternate chemical method must be used in order to obtain a confirmed analytical result. Gas chromatography / mass spectrometry (GC/MS) is the preferred confirm                          atory method. Performed at [facility], [address] [place], [place] [address]   . Acetaminophen (Tylenol), Serum 03/25/2020 <10* 10 - 30 ug/mL Final   Comment: (NOTE) Therapeutic concentrations vary significantly. A range of 10-30 ug/mL  may be an effective concentration for many patients. However, some  are best treated at concentrations outside of this range. Acetaminophen concentrations >150 ug/mL at 4 hours after ingestion  and >50 ug/mL at 12 hours after ingestion are often associated with  toxic reactions.  Performed at [facility], [address] [place], [place] [address]   . SARS Coronavirus 2 by RT PCR 03/25/2020 NEGATIVE  NEGATIVE Final   Comment: (NOTE) SARS-CoV-2 target nucleic acids are NOT DETECTED.  The SARS-CoV-2 RNA is generally detectable in upper respiratory specimens during the acute phase of infection. The lowest concentration of SARS-CoV-2 viral copies this assay can detect is 138 copies/mL. A negative result does not preclude  SARS-Cov-2 infection and should not be used as the sole basis for treatment or other patient management decisions. A negative result may occur with  improper specimen collection/handling, submission of specimen other than nasopharyngeal swab, presence of viral mutation(s) within the areas targeted by this assay, and inadequate number of viral copies(<138 copies/mL). A negative result must be combined with clinical observations, patient history, and epidemiological information. The expected result is Negative.  Fact Sheet for Patients:  EntrepreneurPulse.com.au  Fact Sheet for Healthcare Providers:  IncredibleEmployment.be  This test is no                          t yet approved or cleared by the [place] FDA and  has been authorized for detection and/or diagnosis of SARS-CoV-2 by FDA under an Emergency Use Authorization (EUA). This EUA will remain  in effect (meaning this test can be used) for the duration of the COVID-19 declaration under Section 564(b)(1) of the Act, 21 U.S.C.section 360bbb-3(b)(1), unless the authorization is terminated  or revoked sooner.      . Influenza A by PCR 03/25/2020 NEGATIVE  NEGATIVE Final  . Influenza B by PCR 03/25/2020 NEGATIVE  NEGATIVE Final   Comment: (NOTE) The Xpert Xpress SARS-CoV-2/FLU/RSV plus assay is intended as an aid in the diagnosis of influenza from Nasopharyngeal swab specimens and should not be used as a sole basis for treatment. Nasal washings and aspirates are unacceptable  for Xpert Xpress SARS-CoV-2/FLU/RSV testing.  Fact Sheet for Patients: EntrepreneurPulse.com.au  Fact Sheet for Healthcare Providers: IncredibleEmployment.be  This test is not yet approved or cleared by the [place] FDA and has been authorized for detection and/or diagnosis of SARS-CoV-2 by FDA under an Emergency Use Authorization (EUA). This EUA will remain in effect  (meaning this test can be used) for the duration of the COVID-19 declaration under Section 564(b)(1) of the Act, 21 U.S.C. section 360bbb-3(b)(1), unless the authorization is terminated or revoked.  Performed at [facility], [address], [place], [place] [address]   Admission on 02/16/2020, Discharged on 02/24/2020  Component Date Value Ref Range Status  . Cholesterol 02/16/2020 171  0 - 200 mg/dL Final  . Triglycerides 02/16/2020 330* <150 mg/dL Final  . HDL 02/16/2020 36* >40 mg/dL Final  . Total CHOL/HDL Ratio 02/16/2020 4.8  RATIO Final  . VLDL 02/16/2020 66* 0 - 40 mg/dL Final  . LDL Cholesterol 02/16/2020 69  0 - 99 mg/dL Final   Comment:        Total Cholesterol/HDL:CHD Risk Coronary Heart Disease Risk Table                     Men   Women  1/2 Average Risk   3.4   3.3  Average Risk       5.0   4.4  2 X Average Risk   9.6   7.1  3 X Average Risk  23.4   11.0        Use the calculated Patient Ratio above and the CHD Risk Table to determine the patient's CHD Risk.        ATP III CLASSIFICATION (LDL):  <100     mg/dL   Optimal  100-129  mg/dL   Near or Above                    Optimal  130-159  mg/dL   Borderline  160-189  mg/dL   High  >190     mg/dL   Very High Performed at [facility], [address], [place], [place] [address]   . Hgb A1c MFr Bld 02/16/2020 5.7* 4.8 - 5.6 % Final   Comment: (NOTE)         Prediabetes: 5.7 - 6.4         Diabetes: >6.4         Glycemic control for adults with diabetes: <7.0   . Mean Plasma Glucose 02/16/2020 117  mg/dL Final   Comment: (NOTE) Performed At: [facility] [place], [place] [id number] Rush Farmer MD MP:[id number]   Admission on 02/14/2020, Discharged on 02/16/2020  Component Date Value Ref Range Status  . Tricyclic, Ur Screen 00/86/7619 NONE DETECTED  NONE DETECTED Final  . Amphetamines, Ur Screen 02/15/2020 NONE DETECTED  NONE DETECTED Final  . MDMA (Ecstasy)Ur  Screen 02/15/2020 NONE DETECTED  NONE DETECTED Final  . Cocaine Metabolite,Ur [place] 02/15/2020 POSITIVE* NONE DETECTED Final  . Opiate, Ur Screen 02/15/2020 NONE DETECTED  NONE DETECTED Final  . Phencyclidine (PCP) Ur S 02/15/2020 NONE DETECTED  NONE DETECTED Final  . Cannabinoid 50 Ng, Ur  02/15/2020 NONE DETECTED  NONE DETECTED Final  . Barbiturates, Ur Screen 02/15/2020 NONE DETECTED  NONE DETECTED Final  . Benzodiazepine, Ur Scrn 02/15/2020 NONE DETECTED  NONE DETECTED Final  . Methadone Scn, Ur 02/15/2020 NONE DETECTED  NONE DETECTED Final   Comment: (NOTE) Tricyclics + metabolites,  urine    Cutoff 1000 ng/mL Amphetamines + metabolites, urine  Cutoff 1000 ng/mL MDMA (Ecstasy), urine              Cutoff 500 ng/mL Cocaine Metabolite, urine          Cutoff 300 ng/mL Opiate + metabolites, urine        Cutoff 300 ng/mL Phencyclidine (PCP), urine         Cutoff 25 ng/mL Cannabinoid, urine                 Cutoff 50 ng/mL Barbiturates + metabolites, urine  Cutoff 200 ng/mL Benzodiazepine, urine              Cutoff 200 ng/mL Methadone, urine                   Cutoff 300 ng/mL  The urine drug screen provides only a preliminary, unconfirmed analytical test result and should not be used for non-medical purposes. Clinical consideration and professional judgment should be applied to any positive drug screen result due to possible interfering substances. A more specific alternate chemical method must be used in order to obtain a confirmed analytical result. Gas chromatography / mass spectrometry (GC/MS) is the preferred confirm                          atory method. Performed at [facility], [address] [place], [place] [address]   . Acetaminophen (Tylenol), Serum 02/14/2020 <10* 10 - 30 ug/mL Final   Comment: (NOTE) Therapeutic concentrations vary significantly. A range of 10-30 ug/mL  may be an effective concentration for many patients. However, some  are best treated at  concentrations outside of this range. Acetaminophen concentrations >150 ug/mL at 4 hours after ingestion  and >50 ug/mL at 12 hours after ingestion are often associated with  toxic reactions.  Performed at [facility], [address] [place], [place] [address]   . WBC 02/14/2020 8.5  4.0 - 10.5 K/uL Final  . RBC 02/14/2020 5.35  4.22 - 5.81 MIL/uL Final  . Hemoglobin 02/14/2020 13.9  13.0 - 17.0 g/dL Final  . HCT 02/14/2020 42.9  39 - 52 % Final  . MCV 02/14/2020 80.2  80.0 - 100.0 fL Final  . MCH 02/14/2020 26.0  26.0 - 34.0 pg Final  . MCHC 02/14/2020 32.4  30.0 - 36.0 g/dL Final  . RDW 02/14/2020 14.5  11.5 - 15.5 % Final  . Platelets 02/14/2020 331  150 - 400 K/uL Final  . nRBC 02/14/2020 0.0  0.0 - 0.2 % Final  . Neutrophils Relative % 02/14/2020 61  % Final  . Neutro Abs 02/14/2020 5.2  1.7 - 7.7 K/uL Final  . Lymphocytes Relative 02/14/2020 26  % Final  . Lymphs Abs 02/14/2020 2.2  0.7 - 4.0 K/uL Final  . Monocytes Relative 02/14/2020 10  % Final  . Monocytes Absolute 02/14/2020 0.9  0.1 - 1.0 K/uL Final  . Eosinophils Relative 02/14/2020 2  % Final  . Eosinophils Absolute 02/14/2020 0.1  0.0 - 0.5 K/uL Final  . Basophils Relative 02/14/2020 1  % Final  . Basophils Absolute 02/14/2020 0.0  0.0 - 0.1 K/uL Final  . Immature Granulocytes 02/14/2020 0  % Final  . Abs Immature Granulocytes 02/14/2020 0.03  0.00 - 0.07 K/uL Final   Performed at [facility], [address], [place], [place] [address]  . Sodium 02/14/2020 141  135 -  145 mmol/L Final  . Potassium 02/14/2020 3.4* 3.5 - 5.1 mmol/L Final  . Chloride 02/14/2020 107  98 - 111 mmol/L Final  . CO2 02/14/2020 27  22 - 32 mmol/L Final  . Glucose, Bld 02/14/2020 105* 70 - 99 mg/dL Final   Glucose reference range applies only to samples taken after fasting for at least 8 hours.  . BUN 02/14/2020 17  6 - 20 mg/dL Final  . Creatinine, Ser 02/14/2020 0.83  0.61 - 1.24 mg/dL Final  . Calcium 02/14/2020  9.0  8.9 - 10.3 mg/dL Final  . Total Protein 02/14/2020 7.2  6.5 - 8.1 g/dL Final  . Albumin 02/14/2020 3.8  3.5 - 5.0 g/dL Final  . AST 02/14/2020 19  15 - 41 U/L Final  . ALT 02/14/2020 14  0 - 44 U/L Final  . Alkaline Phosphatase 02/14/2020 80  38 - 126 U/L Final  . Total Bilirubin 02/14/2020 0.5  0.3 - 1.2 mg/dL Final  . GFR, Estimated 02/14/2020 >60  >60 mL/min Final   Comment: (NOTE) Calculated using the CKD-EPI Creatinine Equation (2021)   . Anion gap 02/14/2020 7  5 - 15 Final   Performed at [facility], [address] [place], [place] [address]  . Alcohol, Ethyl (B) 02/14/2020 <10  <10 mg/dL Final   Comment: (NOTE) Lowest detectable limit for serum alcohol is 10 mg/dL.  For medical purposes only. Performed at [facility], [address], [place], [place] [address]   . Lipase 02/14/2020 37  11 - 51 U/L Final   Performed at [facility], [address] [place], [place] [address]  . Salicylate Lvl 31/49/7026 <7.0* 7.0 - 30.0 mg/dL Final   Performed at [facility], [address] [place], [place] [address]  . Acetaminophen (Tylenol), Serum 02/15/2020 <10* 10 - 30 ug/mL Final   Comment: (NOTE) Therapeutic concentrations vary significantly. A range of 10-30 ug/mL  may be an effective concentration for many patients. However, some  are best treated at concentrations outside of this range. Acetaminophen concentrations >150 ug/mL at 4 hours after ingestion  and >50 ug/mL at 12 hours after ingestion are often associated with  toxic reactions.  Performed at [facility], [address], [place], [place] [address]   . Salicylate Lvl 74/03/8785 <7.0* 7.0 - 30.0 mg/dL Final   Performed at [facility], [address] [place], [place] [address]  . SARS Coronavirus 2 by RT PCR 02/15/2020 NEGATIVE  NEGATIVE Final   Comment: (NOTE) SARS-CoV-2 target nucleic acids are NOT DETECTED.  The SARS-CoV-2 RNA is generally  detectable in upper respiratoy specimens during the acute phase of infection. The lowest concentration of SARS-CoV-2 viral copies this assay can detect is 131 copies/mL. A negative result does not preclude SARS-Cov-2 infection and should not be used as the sole basis for treatment or other patient management decisions. A negative result may occur with  improper specimen collection/handling, submission of specimen other than nasopharyngeal swab, presence of viral mutation(s) within the areas targeted by this assay, and inadequate number of viral copies (<131 copies/mL). A negative result must be combined with clinical observations, patient history, and epidemiological information. The expected result is Negative.  Fact Sheet for Patients:  PinkCheek.be  Fact Sheet for Healthcare Providers:  GravelBags.it  This test is no                          t yet approved or  cleared by the [place] and  has been authorized for detection and/or diagnosis of SARS-CoV-2 by FDA under an Emergency Use Authorization (EUA). This EUA will remain  in effect (meaning this test can be used) for the duration of the COVID-19 declaration under Section 564(b)(1) of the Act, 21 U.S.C. section 360bbb-3(b)(1), unless the authorization is terminated or revoked sooner.    . Influenza A by PCR 02/15/2020 NEGATIVE  NEGATIVE Final  . Influenza B by PCR 02/15/2020 NEGATIVE  NEGATIVE Final   Comment: (NOTE) The Xpert Xpress SARS-CoV-2/FLU/RSV assay is intended as an aid in  the diagnosis of influenza from Nasopharyngeal swab specimens and  should not be used as a sole basis for treatment. Nasal washings and  aspirates are unacceptable for Xpert Xpress SARS-CoV-2/FLU/RSV  testing.  Fact Sheet for Patients: PinkCheek.be  Fact Sheet for Healthcare Providers: GravelBags.it  This test is not  yet approved or cleared by the [place] FDA and  has been authorized for detection and/or diagnosis of SARS-CoV-2 by  FDA under an Emergency Use Authorization (EUA). This EUA will remain  in effect (meaning this test can be used) for the duration of the  Covid-19 declaration under Section 564(b)(1) of the Act, 21  U.S.C. section 360bbb-3(b)(1), unless the authorization is  terminated or revoked. Performed at [facility], [address] [place], [place] [address]   Admission on 02/12/2020, Discharged on 02/13/2020  Component Date Value Ref Range Status  . Sodium 02/12/2020 139  135 - 145 mmol/L Final  . Potassium 02/12/2020 4.1  3.5 - 5.1 mmol/L Final  . Chloride 02/12/2020 106  98 - 111 mmol/L Final  . CO2 02/12/2020 25  22 - 32 mmol/L Final  . Glucose, Bld 02/12/2020 106* 70 - 99 mg/dL Final   Glucose reference range applies only to samples taken after fasting for at least 8 hours.  . BUN 02/12/2020 18  6 - 20 mg/dL Final  . Creatinine, Ser 02/12/2020 1.00  0.61 - 1.24 mg/dL Final  . Calcium 02/12/2020 9.4  8.9 - 10.3 mg/dL Final  . Total Protein 02/12/2020 7.4  6.5 - 8.1 g/dL Final  . Albumin 02/12/2020 3.8  3.5 - 5.0 g/dL Final  . AST 02/12/2020 20  15 - 41 U/L Final  . ALT 02/12/2020 18  0 - 44 U/L Final  . Alkaline Phosphatase 02/12/2020 91  38 - 126 U/L Final  . Total Bilirubin 02/12/2020 0.5  0.3 - 1.2 mg/dL Final  . GFR, Estimated 02/12/2020 >60  >60 mL/min Final   Comment: (NOTE) Calculated using the CKD-EPI Creatinine Equation (2021)   . Anion gap 02/12/2020 8  5 - 15 Final   Performed at [facility], [address] [place], [place] [address]  . Alcohol, Ethyl (B) 02/12/2020 <10  <10 mg/dL Final   Comment: (NOTE) Lowest detectable limit for serum alcohol is 10 mg/dL.  For medical purposes only. Performed at [facility], [address] [place], [place] [address]   . Salicylate Lvl 40/01/2724 <7.0* 7.0 - 30.0 mg/dL Final    Performed at [facility], [address] [place],  [address]  . Acetaminophen (Tylenol), Serum 02/12/2020 <10* 10 - 30 ug/mL Final   Comment: (NOTE) Therapeutic concentrations vary significantly. A range of 10-30 ug/mL  may be an effective concentration for many patients. However, some  are best treated at concentrations outside of this range. Acetaminophen concentrations >150 ug/mL at 4 hours after ingestion  and >50 ug/mL  at 12 hours after ingestion are often associated with  toxic reactions.  Performed at [facility], [address] [place], [place] [address]   . WBC 02/12/2020 8.5  4.0 - 10.5 K/uL Final  . RBC 02/12/2020 5.45  4.22 - 5.81 MIL/uL Final  . Hemoglobin 02/12/2020 14.4  13.0 - 17.0 g/dL Final  . HCT 02/12/2020 44.0  39 - 52 % Final  . MCV 02/12/2020 80.7  80.0 - 100.0 fL Final  . MCH 02/12/2020 26.4  26.0 - 34.0 pg Final  . MCHC 02/12/2020 32.7  30.0 - 36.0 g/dL Final  . RDW 02/12/2020 14.4  11.5 - 15.5 % Final  . Platelets 02/12/2020 313  150 - 400 K/uL Final  . nRBC 02/12/2020 0.0  0.0 - 0.2 % Final   Performed at [facility], [address] [place], [place] [address]  Admission on 02/12/2020, Discharged on 02/12/2020  Component Date Value Ref Range Status  . Sodium 02/12/2020 139  135 - 145 mmol/L Final  . Potassium 02/12/2020 3.6  3.5 - 5.1 mmol/L Final  . Chloride 02/12/2020 106  98 - 111 mmol/L Final  . CO2 02/12/2020 24  22 - 32 mmol/L Final  . Glucose, Bld 02/12/2020 99  70 - 99 mg/dL Final   Glucose reference range applies only to samples taken after fasting for at least 8 hours.  . BUN 02/12/2020 19  6 - 20 mg/dL Final  . Creatinine, Ser 02/12/2020 0.88  0.61 - 1.24 mg/dL Final  . Calcium 02/12/2020 8.9  8.9 - 10.3 mg/dL Final  . Total Protein 02/12/2020 7.5  6.5 - 8.1 g/dL Final  . Albumin 02/12/2020 4.0  3.5 - 5.0 g/dL Final  . AST 02/12/2020 23  15 - 41 U/L Final  . ALT 02/12/2020 19  0 - 44 U/L Final  .  Alkaline Phosphatase 02/12/2020 86  38 - 126 U/L Final  . Total Bilirubin 02/12/2020 0.9  0.3 - 1.2 mg/dL Final  . GFR, Estimated 02/12/2020 >60  >60 mL/min Final  . Anion gap 02/12/2020 9  5 - 15 Final   Performed at [facility], [address], [place], [place] [address]  . Alcohol, Ethyl (B) 02/12/2020 <10  <10 mg/dL Final   Comment: (NOTE) Lowest detectable limit for serum alcohol is 10 mg/dL.  For medical purposes only. Performed at [facility], [address] [place], [place] [address]   . Salicylate Lvl 12/45/8099 <7.0* 7.0 - 30.0 mg/dL Final   Performed at [facility], [address] [place], [place] [address]  . Acetaminophen (Tylenol), Serum 02/12/2020 <10* 10 - 30 ug/mL Final   Comment: (NOTE) Therapeutic concentrations vary significantly. A range of 10-30 ug/mL  may be an effective concentration for many patients. However, some  are best treated at concentrations outside of this range. Acetaminophen concentrations >150 ug/mL at 4 hours after ingestion  and >50 ug/mL at 12 hours after ingestion are often associated with  toxic reactions.  Performed at [facility], [address] [place], [place] [address]   . WBC 02/12/2020 10.2  4.0 - 10.5 K/uL Final  . RBC 02/12/2020 5.29  4.22 - 5.81 MIL/uL Final  . Hemoglobin 02/12/2020 14.1  13.0 - 17.0 g/dL Final  . HCT 02/12/2020 42.5  39 - 52 % Final  . MCV 02/12/2020 80.3  80.0 - 100.0 fL Final  . MCH 02/12/2020 26.7  26.0 - 34.0 pg Final  . MCHC 02/12/2020 33.2  30.0 - 36.0 g/dL Final  . RDW 02/12/2020 14.1  11.5 - 15.5 % Final  . Platelets 02/12/2020 295  150 - 400 K/uL Final  . nRBC 02/12/2020 0.0  0.0 - 0.2 % Final   Performed at [facility], [address] [place], [place] [address]  . Tricyclic, Ur Screen 20/25/4270 NONE DETECTED  NONE DETECTED Final  . Amphetamines, Ur Screen 02/12/2020 NONE DETECTED  NONE DETECTED Final  . MDMA (Ecstasy)Ur Screen 02/12/2020  NONE DETECTED  NONE DETECTED Final  . Cocaine Metabolite,Ur [place] 02/12/2020 POSITIVE* NONE DETECTED Final  . Opiate, Ur Screen 02/12/2020 NONE DETECTED  NONE DETECTED Final  . Phencyclidine (PCP) Ur S 02/12/2020 NONE DETECTED  NONE DETECTED Final  . Cannabinoid 50 Ng, Ur [place] 02/12/2020 NONE DETECTED  NONE DETECTED Final  . Barbiturates, Ur Screen 02/12/2020 NONE DETECTED  NONE DETECTED Final  . Benzodiazepine, Ur Scrn 02/12/2020 NONE DETECTED  NONE DETECTED Final  . Methadone Scn, Ur 02/12/2020 NONE DETECTED  NONE DETECTED Final   Comment: (NOTE) Tricyclics + metabolites, urine    Cutoff 1000 ng/mL Amphetamines + metabolites, urine  Cutoff 1000 ng/mL MDMA (Ecstasy), urine              Cutoff 500 ng/mL Cocaine Metabolite, urine          Cutoff 300 ng/mL Opiate + metabolites, urine        Cutoff 300 ng/mL Phencyclidine (PCP), urine         Cutoff 25 ng/mL Cannabinoid, urine                 Cutoff 50 ng/mL Barbiturates + metabolites, urine  Cutoff 200 ng/mL Benzodiazepine, urine              Cutoff 200 ng/mL Methadone, urine                   Cutoff 300 ng/mL  The urine drug screen provides only a preliminary, unconfirmed analytical test result and should not be used for non-medical purposes. Clinical consideration and professional judgment should be applied to any positive drug screen result due to possible interfering substances. A more specific alternate chemical method must be used in order to obtain a confirmed analytical result. Gas chromatography / mass spectrometry (GC/MS) is the preferred confirm                          atory method. Performed at [facility], [address] [place], [place] [address]   . SARS Coronavirus 2 by RT PCR 02/12/2020 NEGATIVE  NEGATIVE Final   Comment: (NOTE) SARS-CoV-2 target nucleic acids are NOT DETECTED.  The SARS-CoV-2 RNA is generally detectable in upper respiratoy specimens during the acute phase of infection. The  lowest concentration of SARS-CoV-2 viral copies this assay can detect is 131 copies/mL. A negative result does not preclude SARS-Cov-2 infection and should not be used as the sole basis for treatment or other patient management decisions. A negative result may occur with  improper specimen collection/handling, submission of specimen other than nasopharyngeal swab, presence of viral mutation(s) within the areas targeted by this assay, and inadequate number of viral copies (<131 copies/mL). A negative result must be combined with clinical observations, patient history, and epidemiological information. The expected result is Negative.  Fact Sheet for Patients:  PinkCheek.be  Fact Sheet for Healthcare Providers:  GravelBags.it  This test is no  t yet approved or cleared by the [place] and  has been authorized for detection and/or diagnosis of SARS-CoV-2 by FDA under an Emergency Use Authorization (EUA). This EUA will remain  in effect (meaning this test can be used) for the duration of the COVID-19 declaration under Section 564(b)(1) of the Act, 21 U.S.C. section 360bbb-3(b)(1), unless the authorization is terminated or revoked sooner.    . Influenza A by PCR 02/12/2020 NEGATIVE  NEGATIVE Final  . Influenza B by PCR 02/12/2020 NEGATIVE  NEGATIVE Final   Comment: (NOTE) The Xpert Xpress SARS-CoV-2/FLU/RSV assay is intended as an aid in  the diagnosis of influenza from Nasopharyngeal swab specimens and  should not be used as a sole basis for treatment. Nasal washings and  aspirates are unacceptable for Xpert Xpress SARS-CoV-2/FLU/RSV  testing.  Fact Sheet for Patients: PinkCheek.be  Fact Sheet for Healthcare Providers: GravelBags.it  This test is not yet approved or cleared by the [place] FDA and  has been authorized for  detection and/or diagnosis of SARS-CoV-2 by  FDA under an Emergency Use Authorization (EUA). This EUA will remain  in effect (meaning this test can be used) for the duration of the  Covid-19 declaration under Section 564(b)(1) of the Act, 21  U.S.C. section 360bbb-3(b)(1), unless the authorization is  terminated or revoked. Performed at [facility], [address] [place], [place] [address]   Admission on 01/28/2020, Discharged on 01/29/2020  Component Date Value Ref Range Status  . Sodium 01/28/2020 141  135 - 145 mmol/L Final  . Potassium 01/28/2020 4.1  3.5 - 5.1 mmol/L Final  . Chloride 01/28/2020 104  98 - 111 mmol/L Final  . CO2 01/28/2020 27  22 - 32 mmol/L Final  . Glucose, Bld 01/28/2020 71  70 - 99 mg/dL Final   Glucose reference range applies only to samples taken after fasting for at least 8 hours.  . BUN 01/28/2020 14  6 - 20 mg/dL Final  . Creatinine, Ser 01/28/2020 0.98  0.61 - 1.24 mg/dL Final  . Calcium 01/28/2020 9.4  8.9 - 10.3 mg/dL Final  . Total Protein 01/28/2020 8.0  6.5 - 8.1 g/dL Final  . Albumin 01/28/2020 4.1  3.5 - 5.0 g/dL Final  . AST 01/28/2020 20  15 - 41 U/L Final  . ALT 01/28/2020 17  0 - 44 U/L Final  . Alkaline Phosphatase 01/28/2020 99  38 - 126 U/L Final  . Total Bilirubin 01/28/2020 0.9  0.3 - 1.2 mg/dL Final  . GFR calc non Af Amer 01/28/2020 >60  >60 mL/min Final  . Anion gap 01/28/2020 10  5 - 15 Final   Performed at [facility], [address], [place], [place] [address]  . Alcohol, Ethyl (B) 01/28/2020 <10  <10 mg/dL Final   Comment: (NOTE) Lowest detectable limit for serum alcohol is 10 mg/dL.  For medical purposes only. Performed at [facility], [address] [place], [place] [address]   . Salicylate Lvl 26/33/3545 <7.0* 7.0 - 30.0 mg/dL Final   Performed at [facility], [address] [place], [place] [address]  . Acetaminophen (Tylenol), Serum 01/28/2020 <10* 10 - 30 ug/mL Final    Comment: (NOTE) Therapeutic concentrations vary significantly. A range of 10-30 ug/mL  may be an effective concentration for many patients. However, some  are best treated at concentrations outside of this range. Acetaminophen concentrations >150 ug/mL at 4 hours after ingestion  and >50 ug/mL at 12 hours after  ingestion are often associated with  toxic reactions.  Performed at [facility], [address], [place], [place] [address]   . WBC 01/28/2020 10.7* 4.0 - 10.5 K/uL Final  . RBC 01/28/2020 5.78  4.22 - 5.81 MIL/uL Final  . Hemoglobin 01/28/2020 15.4  13.0 - 17.0 g/dL Final  . HCT 01/28/2020 48.0  39 - 52 % Final  . MCV 01/28/2020 83.0  80.0 - 100.0 fL Final  . MCH 01/28/2020 26.6  26.0 - 34.0 pg Final  . MCHC 01/28/2020 32.1  30.0 - 36.0 g/dL Final  . RDW 01/28/2020 14.5  11.5 - 15.5 % Final  . Platelets 01/28/2020 284  150 - 400 K/uL Final  . nRBC 01/28/2020 0.0  0.0 - 0.2 % Final   Performed at [facility], [address], [place], [place] [address]  . Tricyclic, Ur Screen 37/90/2409 NONE DETECTED  NONE DETECTED Final  . Amphetamines, Ur Screen 01/28/2020 NONE DETECTED  NONE DETECTED Final  . MDMA (Ecstasy)Ur Screen 01/28/2020 NONE DETECTED  NONE DETECTED Final  . Cocaine Metabolite,Ur [place] 01/28/2020 POSITIVE* NONE DETECTED Final  . Opiate, Ur Screen 01/28/2020 NONE DETECTED  NONE DETECTED Final  . Phencyclidine (PCP) Ur S 01/28/2020 NONE DETECTED  NONE DETECTED Final  . Cannabinoid 50 Ng, Ur [place] 01/28/2020 NONE DETECTED  NONE DETECTED Final  . Barbiturates, Ur Screen 01/28/2020 NONE DETECTED  NONE DETECTED Final  . Benzodiazepine, Ur Scrn 01/28/2020 NONE DETECTED  NONE DETECTED Final  . Methadone Scn, Ur 01/28/2020 NONE DETECTED  NONE DETECTED Final   Comment: (NOTE) Tricyclics + metabolites, urine    Cutoff 1000 ng/mL Amphetamines + metabolites, urine  Cutoff 1000 ng/mL MDMA (Ecstasy), urine              Cutoff 500 ng/mL Cocaine Metabolite, urine           Cutoff 300 ng/mL Opiate + metabolites, urine        Cutoff 300 ng/mL Phencyclidine (PCP), urine         Cutoff 25 ng/mL Cannabinoid, urine                 Cutoff 50 ng/mL Barbiturates + metabolites, urine  Cutoff 200 ng/mL Benzodiazepine, urine              Cutoff 200 ng/mL Methadone, urine                   Cutoff 300 ng/mL  The urine drug screen provides only a preliminary, unconfirmed analytical test result and should not be used for non-medical purposes. Clinical consideration and professional judgment should be applied to any positive drug screen result due to possible interfering substances. A more specific alternate chemical method must be used in order to obtain a confirmed analytical result. Gas chromatography / mass spectrometry (GC/MS) is the preferred confirm                          atory method. Performed at [facility], [address] [place], [place] [address]   . SARS Coronavirus 2 by RT PCR 01/28/2020 NEGATIVE  NEGATIVE Final   Comment: (NOTE) SARS-CoV-2 target nucleic acids are NOT DETECTED.  The SARS-CoV-2 RNA is generally detectable in upper respiratoy specimens during the acute phase of infection. The lowest concentration of SARS-CoV-2 viral copies this assay can detect is 131 copies/mL. A negative result does not preclude SARS-Cov-2 infection and should not be used as the sole basis  for treatment or other patient management decisions. A negative result may occur with  improper specimen collection/handling, submission of specimen other than nasopharyngeal swab, presence of viral mutation(s) within the areas targeted by this assay, and inadequate number of viral copies (<131 copies/mL). A negative result must be combined with clinical observations, patient history, and epidemiological information. The expected result is Negative.  Fact Sheet for Patients:  PinkCheek.be  Fact Sheet for Healthcare Providers:   GravelBags.it  This test is no                          t yet approved or cleared by the [place] FDA and  has been authorized for detection and/or diagnosis of SARS-CoV-2 by FDA under an Emergency Use Authorization (EUA). This EUA will remain  in effect (meaning this test can be used) for the duration of the COVID-19 declaration under Section 564(b)(1) of the Act, 21 U.S.C. section 360bbb-3(b)(1), unless the authorization is terminated or revoked sooner.    . Influenza A by PCR 01/28/2020 NEGATIVE  NEGATIVE Final  . Influenza B by PCR 01/28/2020 NEGATIVE  NEGATIVE Final   Comment: (NOTE) The Xpert Xpress SARS-CoV-2/FLU/RSV assay is intended as an aid in  the diagnosis of influenza from Nasopharyngeal swab specimens and  should not be used as a sole basis for treatment. Nasal washings and  aspirates are unacceptable for Xpert Xpress SARS-CoV-2/FLU/RSV  testing.  Fact Sheet for Patients: PinkCheek.be  Fact Sheet for Healthcare Providers: GravelBags.it  This test is not yet approved or cleared by the [place] FDA and  has been authorized for detection and/or diagnosis of SARS-CoV-2 by  FDA under an Emergency Use Authorization (EUA). This EUA will remain  in effect (meaning this test can be used) for the duration of the  Covid-19 declaration under Section 564(b)(1) of the Act, 21  U.S.C. section 360bbb-3(b)(1), unless the authorization is  terminated or revoked. Performed at [facility], [address] [place], [place] [address]   Admission on 01/20/2020, Discharged on 01/20/2020  Component Date Value Ref Range Status  . Sodium 01/20/2020 137  135 - 145 mmol/L Final  . Potassium 01/20/2020 3.7  3.5 - 5.1 mmol/L Final  . Chloride 01/20/2020 106  98 - 111 mmol/L Final  . CO2 01/20/2020 25  22 - 32 mmol/L Final  . Glucose, Bld 01/20/2020 103* 70 - 99 mg/dL Final   Glucose  reference range applies only to samples taken after fasting for at least 8 hours.  . BUN 01/20/2020 14  6 - 20 mg/dL Final  . Creatinine, Ser 01/20/2020 0.92  0.61 - 1.24 mg/dL Final  . Calcium 01/20/2020 8.9  8.9 - 10.3 mg/dL Final  . Total Protein 01/20/2020 6.8  6.5 - 8.1 g/dL Final  . Albumin 01/20/2020 3.8  3.5 - 5.0 g/dL Final  . AST 01/20/2020 22  15 - 41 U/L Final  . ALT 01/20/2020 17  0 - 44 U/L Final  . Alkaline Phosphatase 01/20/2020 81  38 - 126 U/L Final  . Total Bilirubin 01/20/2020 0.7  0.3 - 1.2 mg/dL Final  . GFR calc non Af Amer 01/20/2020 >60  >60 mL/min Final  . GFR calc Af Amer 01/20/2020 >60  >60 mL/min Final  . Anion gap 01/20/2020 6  5 - 15 Final   Performed at [facility], [address] [place], [place] [address]  . Alcohol, Ethyl (B) 01/20/2020 <10  <10 mg/dL Final  Comment: (NOTE) Lowest detectable limit for serum alcohol is 10 mg/dL.  For medical purposes only. Performed at [facility], [address], [place], [place] [address]   . Salicylate Lvl 02/77/4128 <7.0* 7.0 - 30.0 mg/dL Final   Performed at [name], [address] [place], [place] [address]  . Acetaminophen (Tylenol), Serum 01/20/2020 <10* 10 - 30 ug/mL Final   Comment: (NOTE) Therapeutic concentrations vary significantly. A range of 10-30 ug/mL  may be an effective concentration for many patients. However, some  are best treated at concentrations outside of this range. Acetaminophen concentrations >150 ug/mL at 4 hours after ingestion  and >50 ug/mL at 12 hours after ingestion are often associated with  toxic reactions.  Performed at [facility], [address] [place], [place] [address]   . WBC 01/20/2020 6.6  4.0 - 10.5 K/uL Final  . RBC 01/20/2020 5.29  4.22 - 5.81 MIL/uL Final  . Hemoglobin 01/20/2020 14.2  13.0 - 17.0 g/dL Final  . HCT 01/20/2020 43.2  39 - 52 % Final  . MCV 01/20/2020 81.7  80.0 - 100.0 fL Final  . MCH  01/20/2020 26.8  26.0 - 34.0 pg Final  . MCHC 01/20/2020 32.9  30.0 - 36.0 g/dL Final  . RDW 01/20/2020 14.7  11.5 - 15.5 % Final  . Platelets 01/20/2020 287  150 - 400 K/uL Final  . nRBC 01/20/2020 0.0  0.0 - 0.2 % Final   Performed at [facility], [address], [place], [place] [address]  . Tricyclic, Ur Screen 83/66/2947 NONE DETECTED  NONE DETECTED Final  . Amphetamines, Ur Screen 01/20/2020 NONE DETECTED  NONE DETECTED Final  . MDMA (Ecstasy)Ur Screen 01/20/2020 NONE DETECTED  NONE DETECTED Final  . Cocaine Metabolite,Ur [place] 01/20/2020 POSITIVE* NONE DETECTED Final  . Opiate, Ur Screen 01/20/2020 NONE DETECTED  NONE DETECTED Final  . Phencyclidine (PCP) Ur S 01/20/2020 NONE DETECTED  NONE DETECTED Final  . Cannabinoid 50 Ng, Ur [place] 01/20/2020 NONE DETECTED  NONE DETECTED Final  . Barbiturates, Ur Screen 01/20/2020 NONE DETECTED  NONE DETECTED Final  . Benzodiazepine, Ur Scrn 01/20/2020 NONE DETECTED  NONE DETECTED Final  . Methadone Scn, Ur 01/20/2020 NONE DETECTED  NONE DETECTED Final   Comment: (NOTE) Tricyclics + metabolites, urine    Cutoff 1000 ng/mL Amphetamines + metabolites, urine  Cutoff 1000 ng/mL MDMA (Ecstasy), urine              Cutoff 500 ng/mL Cocaine Metabolite, urine          Cutoff 300 ng/mL Opiate + metabolites, urine        Cutoff 300 ng/mL Phencyclidine (PCP), urine         Cutoff 25 ng/mL Cannabinoid, urine                 Cutoff 50 ng/mL Barbiturates + metabolites, urine  Cutoff 200 ng/mL Benzodiazepine, urine              Cutoff 200 ng/mL Methadone, urine                   Cutoff 300 ng/mL  The urine drug screen provides only a preliminary, unconfirmed analytical test result and should not be used for non-medical purposes. Clinical consideration and professional judgment should be applied to any positive drug screen result due to possible interfering substances. A more specific alternate chemical method must be used in order to obtain a  confirmed analytical result. Gas chromatography /  mass spectrometry (GC/MS) is the preferred confirm                          atory method. Performed at [facility], [address], [place], [place] [address]   . SARS Coronavirus 2 by RT PCR 01/20/2020 NEGATIVE  NEGATIVE Final   Comment: (NOTE) SARS-CoV-2 target nucleic acids are NOT DETECTED.  The SARS-CoV-2 RNA is generally detectable in upper respiratoy specimens during the acute phase of infection. The lowest concentration of SARS-CoV-2 viral copies this assay can detect is 131 copies/mL. A negative result does not preclude SARS-Cov-2 infection and should not be used as the sole basis for treatment or other patient management decisions. A negative result may occur with  improper specimen collection/handling, submission of specimen other than nasopharyngeal swab, presence of viral mutation(s) within the areas targeted by this assay, and inadequate number of viral copies (<131 copies/mL). A negative result must be combined with clinical observations, patient history, and epidemiological information. The expected result is Negative.  Fact Sheet for Patients:  PinkCheek.be  Fact Sheet for Healthcare Providers:  GravelBags.it  This test is no                          t yet approved or cleared by the [place] FDA and  has been authorized for detection and/or diagnosis of SARS-CoV-2 by FDA under an Emergency Use Authorization (EUA). This EUA will remain  in effect (meaning this test can be used) for the duration of the COVID-19 declaration under Section 564(b)(1) of the Act, 21 U.S.C. section 360bbb-3(b)(1), unless the authorization is terminated or revoked sooner.    . Influenza A by PCR 01/20/2020 NEGATIVE  NEGATIVE Final  . Influenza B by PCR 01/20/2020 NEGATIVE  NEGATIVE Final   Comment: (NOTE) The Xpert Xpress SARS-CoV-2/FLU/RSV assay is intended as  an aid in  the diagnosis of influenza from Nasopharyngeal swab specimens and  should not be used as a sole basis for treatment. Nasal washings and  aspirates are unacceptable for Xpert Xpress SARS-CoV-2/FLU/RSV  testing.  Fact Sheet for Patients: PinkCheek.be  Fact Sheet for Healthcare Providers: GravelBags.it  This test is not yet approved or cleared by the [place] FDA and  has been authorized for detection and/or diagnosis of SARS-CoV-2 by  FDA under an Emergency Use Authorization (EUA). This EUA will remain  in effect (meaning this test can be used) for the duration of the  Covid-19 declaration under Section 564(b)(1) of the Act, 21  U.S.C. section 360bbb-3(b)(1), unless the authorization is  terminated or revoked. Performed at [facility], [address] [place], [place] [address]   Admission on 01/01/2020, Discharged on 01/03/2020  Component Date Value Ref Range Status  . Sodium 01/01/2020 138  135 - 145 mmol/L Final  . Potassium 01/01/2020 4.2  3.5 - 5.1 mmol/L Final  . Chloride 01/01/2020 104  98 - 111 mmol/L Final  . CO2 01/01/2020 24  22 - 32 mmol/L Final  . Glucose, Bld 01/01/2020 126* 70 - 99 mg/dL Final   Glucose reference range applies only to samples taken after fasting for at least 8 hours.  . BUN 01/01/2020 12  6 - 20 mg/dL Final  . Creatinine, Ser 01/01/2020 1.09  0.61 - 1.24 mg/dL Final  . Calcium 01/01/2020 9.0  8.9 - 10.3 mg/dL Final  . Total Protein 01/01/2020 7.2  6.5 - 8.1 g/dL Final  .  Albumin 01/01/2020 3.7  3.5 - 5.0 g/dL Final  . AST 01/01/2020 17  15 - 41 U/L Final  . ALT 01/01/2020 11  0 - 44 U/L Final  . Alkaline Phosphatase 01/01/2020 118  38 - 126 U/L Final  . Total Bilirubin 01/01/2020 0.6  0.3 - 1.2 mg/dL Final  . GFR calc non Af Amer 01/01/2020 >60  >60 mL/min Final  . GFR calc Af Amer 01/01/2020 >60  >60 mL/min Final  . Anion gap 01/01/2020 10  5 - 15 Final    Performed at [facility], [address], [place], [place] [address]  . Alcohol, Ethyl (B) 01/01/2020 <10  <10 mg/dL Final   Comment: (NOTE) Lowest detectable limit for serum alcohol is 10 mg/dL.  For medical purposes only. Performed at [facility], [address] [place] [address], [place], [place] [address]   . Salicylate Lvl 75/44/9201 <7.0* 7.0 - 30.0 mg/dL Final   Performed at [facility], [address] [place], [place] [address]  . Acetaminophen (Tylenol), Serum 01/01/2020 <10* 10 - 30 ug/mL Final   Comment: (NOTE) Therapeutic concentrations vary significantly. A range of 10-30 ug/mL  may be an effective concentration for many patients. However, some  are best treated at concentrations outside of this range. Acetaminophen concentrations >150 ug/mL at 4 hours after ingestion  and >50 ug/mL at 12 hours after ingestion are often associated with  toxic reactions.  Performed at [facility], [address] [place], [place] [address]   . WBC 01/01/2020 8.1  4.0 - 10.5 K/uL Final  . RBC 01/01/2020 5.36  4.22 - 5.81 MIL/uL Final  . Hemoglobin 01/01/2020 14.4  13.0 - 17.0 g/dL Final  . HCT 01/01/2020 44.7  39 - 52 % Final  . MCV 01/01/2020 83.4  80.0 - 100.0 fL Final  . MCH 01/01/2020 26.9  26.0 - 34.0 pg Final  . MCHC 01/01/2020 32.2  30.0 - 36.0 g/dL Final  . RDW 01/01/2020 14.9  11.5 - 15.5 % Final  . Platelets 01/01/2020 300  150 - 400 K/uL Final  . nRBC 01/01/2020 0.0  0.0 - 0.2 % Final   Performed at [facility], [address], [place], [place] [address]  . Tricyclic, Ur Screen 98/26/4158 NONE DETECTED  NONE DETECTED Final  . Amphetamines, Ur Screen 01/01/2020 NONE DETECTED  NONE DETECTED Final  . MDMA (Ecstasy)Ur Screen 01/01/2020 NONE DETECTED  NONE DETECTED Final  . Cocaine Metabolite,Ur [place] 01/01/2020 POSITIVE* NONE DETECTED Final  . Opiate, Ur Screen 01/01/2020 NONE DETECTED  NONE DETECTED Final  . Phencyclidine (PCP) Ur S  01/01/2020 NONE DETECTED  NONE DETECTED Final  . Cannabinoid 50 Ng, Ur [place] 01/01/2020 NONE DETECTED  NONE DETECTED Final  . Barbiturates, Ur Screen 01/01/2020 NONE DETECTED  NONE DETECTED Final  . Benzodiazepine, Ur Scrn 01/01/2020 NONE DETECTED  NONE DETECTED Final  . Methadone Scn, Ur 01/01/2020 NONE DETECTED  NONE DETECTED Final   Comment: (NOTE) Tricyclics + metabolites, urine    Cutoff 1000 ng/mL Amphetamines + metabolites, urine  Cutoff 1000 ng/mL MDMA (Ecstasy), urine              Cutoff 500 ng/mL Cocaine Metabolite, urine          Cutoff 300 ng/mL Opiate + metabolites, urine        Cutoff 300 ng/mL Phencyclidine (PCP), urine         Cutoff 25 ng/mL Cannabinoid, urine  Cutoff 50 ng/mL Barbiturates + metabolites, urine  Cutoff 200 ng/mL Benzodiazepine, urine              Cutoff 200 ng/mL Methadone, urine                   Cutoff 300 ng/mL  The urine drug screen provides only a preliminary, unconfirmed analytical test result and should not be used for non-medical purposes. Clinical consideration and professional judgment should be applied to any positive drug screen result due to possible interfering substances. A more specific alternate chemical method must be used in order to obtain a confirmed analytical result. Gas chromatography / mass spectrometry (GC/MS) is the preferred confirm                          atory method. Performed at [facility], [address] [place], [place] [address]   . SARS Coronavirus 2 01/01/2020 NEGATIVE  NEGATIVE Final   Comment: (NOTE) SARS-CoV-2 target nucleic acids are NOT DETECTED.  The SARS-CoV-2 RNA is generally detectable in upper and lower respiratory specimens during the acute phase of infection. The lowest concentration of SARS-CoV-2 viral copies this assay can detect is 250 copies / mL. A negative result does not preclude SARS-CoV-2 infection and should not be used as the sole basis for treatment or  other patient management decisions.  A negative result may occur with improper specimen collection / handling, submission of specimen other than nasopharyngeal swab, presence of viral mutation(s) within the areas targeted by this assay, and inadequate number of viral copies (<250 copies / mL). A negative result must be combined with clinical observations, patient history, and epidemiological information.  Fact Sheet for Patients:   StrictlyIdeas.no  Fact Sheet for Healthcare Providers: BankingDealers.co.za  This test is not yet approved or                           cleared by the [place] FDA and has been authorized for detection and/or diagnosis of SARS-CoV-2 by FDA under an Emergency Use Authorization (EUA).  This EUA will remain in effect (meaning this test can be used) for the duration of the COVID-19 declaration under Section 564(b)(1) of the Act, 21 U.S.C. section 360bbb-3(b)(1), unless the authorization is terminated or revoked sooner.  Performed at [facility], [address], [place], [place] [address]   Admission on 12/03/2019, Discharged on 12/05/2019  Component Date Value Ref Range Status  . Sodium 12/03/2019 139  135 - 145 mmol/L Final  . Potassium 12/03/2019 3.9  3.5 - 5.1 mmol/L Final  . Chloride 12/03/2019 107  98 - 111 mmol/L Final  . CO2 12/03/2019 20* 22 - 32 mmol/L Final  . Glucose, Bld 12/03/2019 135* 70 - 99 mg/dL Final   Glucose reference range applies only to samples taken after fasting for at least 8 hours.  . BUN 12/03/2019 7  6 - 20 mg/dL Final  . Creatinine, Ser 12/03/2019 1.10  0.61 - 1.24 mg/dL Final  . Calcium 12/03/2019 9.0  8.9 - 10.3 mg/dL Final  . Total Protein 12/03/2019 6.8  6.5 - 8.1 g/dL Final  . Albumin 12/03/2019 3.7  3.5 - 5.0 g/dL Final  . AST 12/03/2019 22  15 - 41 U/L Final  . ALT 12/03/2019 11  0 - 44 U/L Final  . Alkaline Phosphatase 12/03/2019 89  38 - 126 U/L Final   . Total Bilirubin  12/03/2019 0.5  0.3 - 1.2 mg/dL Final  . GFR calc non Af Amer 12/03/2019 >60  >60 mL/min Final  . GFR calc Af Amer 12/03/2019 >60  >60 mL/min Final  . Anion gap 12/03/2019 12  5 - 15 Final   Performed at [facility], [address], [place], [place] [address]  . Alcohol, Ethyl (B) 12/03/2019 <10  <10 mg/dL Final   Comment: (NOTE) Lowest detectable limit for serum alcohol is 10 mg/dL.  For medical purposes only. Performed at [facility], [address] [place], [place] [address]   . Salicylate Lvl 44/04/270 <7.0* 7.0 - 30.0 mg/dL Final   Performed at [facility], [address] [place], [place] [address]  . Acetaminophen (Tylenol), Serum 12/03/2019 <10* 10 - 30 ug/mL Final   Comment: (NOTE) Therapeutic concentrations vary significantly. A range of 10-30 ug/mL  may be an effective concentration for many patients. However, some  are best treated at concentrations outside of this range. Acetaminophen concentrations >150 ug/mL at 4 hours after ingestion  and >50 ug/mL at 12 hours after ingestion are often associated with  toxic reactions.  Performed at [facility], [address], [place], [place] [address]   . WBC 12/03/2019 6.9  4.0 - 10.5 K/uL Final  . RBC 12/03/2019 5.16  4.22 - 5.81 MIL/uL Final  . Hemoglobin 12/03/2019 13.8  13.0 - 17.0 g/dL Final  . HCT 12/03/2019 43.4  39 - 52 % Final  . MCV 12/03/2019 84.1  80.0 - 100.0 fL Final  . MCH 12/03/2019 26.7  26.0 - 34.0 pg Final  . MCHC 12/03/2019 31.8  30.0 - 36.0 g/dL Final  . RDW 12/03/2019 14.6  11.5 - 15.5 % Final  . Platelets 12/03/2019 288  150 - 400 K/uL Final  . nRBC 12/03/2019 0.0  0.0 - 0.2 % Final   Performed at [facility], [address] [place], [place] [address]  . Tricyclic, Ur Screen 63/87/5643 NONE DETECTED  NONE DETECTED Final  . Amphetamines, Ur Screen 12/03/2019 NONE DETECTED  NONE DETECTED Final  . MDMA (Ecstasy)Ur Screen  12/03/2019 NONE DETECTED  NONE DETECTED Final  . Cocaine Metabolite,Ur [place] 12/03/2019 POSITIVE* NONE DETECTED Final  . Opiate, Ur Screen 12/03/2019 NONE DETECTED  NONE DETECTED Final  . Phencyclidine (PCP) Ur S 12/03/2019 NONE DETECTED  NONE DETECTED Final  . Cannabinoid 50 Ng, Ur [place] 12/03/2019 NONE DETECTED  NONE DETECTED Final  . Barbiturates, Ur Screen 12/03/2019 NONE DETECTED  NONE DETECTED Final  . Benzodiazepine, Ur Scrn 12/03/2019 NONE DETECTED  NONE DETECTED Final  . Methadone Scn, Ur 12/03/2019 NONE DETECTED  NONE DETECTED Final   Comment: (NOTE) Tricyclics + metabolites, urine    Cutoff 1000 ng/mL Amphetamines + metabolites, urine  Cutoff 1000 ng/mL MDMA (Ecstasy), urine              Cutoff 500 ng/mL Cocaine Metabolite, urine          Cutoff 300 ng/mL Opiate + metabolites, urine        Cutoff 300 ng/mL Phencyclidine (PCP), urine         Cutoff 25 ng/mL Cannabinoid, urine                 Cutoff 50 ng/mL Barbiturates + metabolites, urine  Cutoff 200 ng/mL Benzodiazepine, urine              Cutoff 200 ng/mL Methadone, urine  Cutoff 300 ng/mL  The urine drug screen provides only a preliminary, unconfirmed analytical test result and should not be used for non-medical purposes. Clinical consideration and professional judgment should be applied to any positive drug screen result due to possible interfering substances. A more specific alternate chemical method must be used in order to obtain a confirmed analytical result. Gas chromatography / mass spectrometry (GC/MS) is the preferred confirm                          atory method. Performed at [facility], [address] [place], [place] [address]   . SARS Coronavirus 2 12/03/2019 NEGATIVE  NEGATIVE Final   Comment: (NOTE) SARS-CoV-2 target nucleic acids are NOT DETECTED.  The SARS-CoV-2 RNA is generally detectable in upper and lower respiratory specimens during the acute phase of infection. The  lowest concentration of SARS-CoV-2 viral copies this assay can detect is 250 copies / mL. A negative result does not preclude SARS-CoV-2 infection and should not be used as the sole basis for treatment or other patient management decisions.  A negative result may occur with improper specimen collection / handling, submission of specimen other than nasopharyngeal swab, presence of viral mutation(s) within the areas targeted by this assay, and inadequate number of viral copies (<250 copies / mL). A negative result must be combined with clinical observations, patient history, and epidemiological information.  Fact Sheet for Patients:   StrictlyIdeas.no  Fact Sheet for Healthcare Providers: BankingDealers.co.za  This test is not yet approved or                           cleared by the [place] FDA and has been authorized for detection and/or diagnosis of SARS-CoV-2 by FDA under an Emergency Use Authorization (EUA).  This EUA will remain in effect (meaning this test can be used) for the duration of the COVID-19 declaration under Section 564(b)(1) of the Act, 21 U.S.C. section 360bbb-3(b)(1), unless the authorization is terminated or revoked sooner.  Performed at [facility], [address] [place], [place] [address]   There may be more visits with results that are not included.    Allergies: Acetaminophen  PTA Medications: (Not in a hospital admission)   Medical Decision Making  Based on my evaluation patient meets criteria for [facility] admission. Admission orders have been initiated. Patient will be revaluated by psychiatry in the morning.    Recommendations  Based on my evaluation the patient does not appear to have an emergency medical condition.  Malachy Mood, PA 03/29/20  6:51 AM

## 2020-03-29 NOTE — ED Notes (Signed)
LPN gave cookies and drink

## 2020-03-29 NOTE — ED Notes (Signed)
Pt A&O x 4, no distress noted, calm & cooperative.  Monitoring for safety.

## 2020-03-29 NOTE — ED Provider Notes (Signed)
Behavioral Health Progress Note  Date and Time: 03/29/2020 2:24 PM Name: Samuel Molina MRN:  [id number]  Subjective:  "I tried to kill myself"  [name], 59 y.o., male patient seen face to face by this provider, consulted with Dr. Dwyane Dee; and chart reviewed on 03/29/20.  Patient presented to [facility] from ED with complaints that he had tried to kill himself via overdose.  Patient recently discharged from [facility] with similar complaint.  On evaluation Samuel Molina reports that he continues to be suicidal after taking an overdose to kill himself.  States that his wife died 3 months ago and he has no support.  "I tried to kill myself.   I took a bunch of pills.  I've got a lot of depression after I buried my wife 3 months ago.  My daughter is not talking to me."  Patient states recently discharged from [facility]; was started on medication and given 7 days of samples but no prescription.  States that he did not continue to take medication after discharged because had no way to get more medicine.  "The follow up appointment was 2 weeks away."  Patient is also endorsing passive homicidal ideation "any body that fucks with me.  That is another thing.  I get irritated really quick. I broke my cousins' arm cause getting on my nerves.  Patient also states that he is paranoid.  When asked if he felt he was being watched, or felt someone was trying to harm or kill him; patient replied "All of what you said." but gave no further elaboration.  Patient states he lives a lone in [place] and that he is employed by [employer]. Patient is unable to contract for safety; and doesn't remember the medications he was prescribed at [facility]   During evaluation Samuel Molina is alert/oriented x 4; calm/cooperative; and mood is congruent with affect.  He does not appear to be responding to internal/external stimuli or delusional thoughts; although he is reporting auditory hallucinations.  Patient is also endorsing suicidal ideation  with plan, passive homicidal ideation, and paranoia.  Patient answered question appropriately.  Patient also admits to cocaine use and last use yesterday.  Related to patient chronic history and multiple ED visits with identical complaints possible malingering for secondary gain.    At [facility] 10/25 - 02/24/2020 patient treated with Seroquel 300 mg Q hs Celexa 10 mg daily  and was to follow up with:   [facility] Follow up.   Why: Your appointment is 03/01/2020 at 12:30PM.  Please bring a copy of your prescriptions, ID, SS card and insurance card.  Thanks! Contact information: [place] [address] [phone]  Patient also seen [facility] ED for suicide attempt.  Patient seen by [name] reporting that patient has had multiple ED visits reporting  Diagnosis:  Final diagnoses:  Suicide ideation  Auditory hallucinations  Substance induced mood disorder ([place])  Cocaine abuse ([place])  Severe recurrent major depression without psychotic features ([place])    Total Time spent with patient: 30 minutes  Past Psychiatric History: Depression; substance abuse Per [facility] Discharge Summer psych history:  Past history of at least3prior inpatient treatments here back to back. Onadmissions he was not really cooperative with much treatment in the hospital but demanded referral to inpatient rehab then left the hospital early when things did not happen quickly enough. No known prior suicide attemptsprior to this most recent attempt.  Past Medical History:  Past Medical History:  Diagnosis Date  .  Anxiety   . Depression   . GERD (gastroesophageal reflux disease)   . Obesity   . Substance abuse [facility])     Past Surgical History:  Procedure Laterality Date  . ESOPHAGOGASTRODUODENOSCOPY (EGD) WITH PROPOFOL N/A 09/20/2017   Procedure: ESOPHAGOGASTRODUODENOSCOPY (EGD) WITH PROPOFOL;  Surgeon: Lin Landsman, MD;  Location: [place];  Service: Gastroenterology;   Laterality: N/A;  . KNEE SURGERY Right    Family History: History reviewed. No pertinent family history. Family Psychiatric  History: Denies Social History:  Social History   Substance and Sexual Activity  Alcohol Use Not Currently     Social History   Substance and Sexual Activity  Drug Use Yes  . Types: Cocaine    Social History   Socioeconomic History  . Marital status: Single    Spouse name: Not on file  . Number of children: Not on file  . Years of education: Not on file  . Highest education level: Not on file  Occupational History  . Not on file  Tobacco Use  . Smoking status: Current Every Day Smoker    Packs/day: 0.50    Types: Cigarettes  . Smokeless tobacco: Never Used  Vaping Use  . Vaping Use: Never used  Substance and Sexual Activity  . Alcohol use: Not Currently  . Drug use: Yes    Types: Cocaine  . Sexual activity: Not on file  Other Topics Concern  . Not on file  Social History Narrative  . Not on file   Social Determinants of Health   Financial Resource Strain:   . Difficulty of Paying Living Expenses: Not on file  Food Insecurity:   . Worried About Charity fundraiser in the Last Year: Not on file  . Ran Out of Food in the Last Year: Not on file  Transportation Needs:   . Lack of Transportation (Medical): Not on file  . Lack of Transportation (Non-Medical): Not on file  Physical Activity:   . Days of Exercise per Week: Not on file  . Minutes of Exercise per Session: Not on file  Stress:   . Feeling of Stress : Not on file  Social Connections:   . Frequency of Communication with Friends and Family: Not on file  . Frequency of Social Gatherings with Friends and Family: Not on file  . Attends Religious Services: Not on file  . Active Member of Clubs or Organizations: Not on file  . Attends Archivist Meetings: Not on file  . Marital Status: Not on file   SDOH:  SDOH Screenings   Alcohol Screen: Low Risk   . Last Alcohol  Screening Score (AUDIT): 1  Depression (PHQ2-9):   . PHQ-2 Score: Not on file  Financial Resource Strain:   . Difficulty of Paying Living Expenses: Not on file  Food Insecurity:   . Worried About Charity fundraiser in the Last Year: Not on file  . Ran Out of Food in the Last Year: Not on file  Housing:   . Last Housing Risk Score: Not on file  Physical Activity:   . Days of Exercise per Week: Not on file  . Minutes of Exercise per Session: Not on file  Social Connections:   . Frequency of Communication with Friends and Family: Not on file  . Frequency of Social Gatherings with Friends and Family: Not on file  . Attends Religious Services: Not on file  . Active Member of Clubs or Organizations: Not on file  .  Attends Archivist Meetings: Not on file  . Marital Status: Not on file  Stress:   . Feeling of Stress : Not on file  Tobacco Use: High Risk  . Smoking Tobacco Use: Current Every Day Smoker  . Smokeless Tobacco Use: Never Used  Transportation Needs:   . Film/video editor (Medical): Not on file  . Lack of Transportation (Non-Medical): Not on file   Additional Social History:                         Sleep: Good  Appetite:  Good  Current Medications:  No current facility-administered medications for this encounter.   Current Outpatient Medications  Medication Sig Dispense Refill  . citalopram (CELEXA) 10 MG tablet Take 1 tablet (10 mg total) by mouth daily. 30 tablet 1  . meloxicam (MOBIC) 15 MG tablet Take 1 tablet (15 mg total) by mouth daily. (Patient not taking: Reported on 03/29/2020) 7 tablet 0  . methocarbamol (ROBAXIN) 500 MG tablet Take 1 tablet (500 mg total) by mouth every 8 (eight) hours as needed for muscle spasms. 15 tablet 0  . naproxen (NAPROSYN) 500 MG tablet Take 1 tablet (500 mg total) by mouth 2 (two) times daily with a meal. 60 tablet 0  . pantoprazole (PROTONIX) 40 MG tablet Take 2 tablets (80 mg total) by mouth daily.  (Patient not taking: Reported on 03/29/2020) 60 tablet 1  . QUEtiapine (SEROQUEL) 300 MG tablet Take 1 tablet (300 mg total) by mouth at bedtime. 30 tablet 1    Labs  Lab Results:  Admission on 03/28/2020, Discharged on 03/29/2020  Component Date Value Ref Range Status  . WBC 03/28/2020 6.8  4.0 - 10.5 K/uL Final  . RBC 03/28/2020 5.25  4.22 - 5.81 MIL/uL Final  . Hemoglobin 03/28/2020 13.7  13.0 - 17.0 g/dL Final  . HCT 03/28/2020 44.2  39 - 52 % Final  . MCV 03/28/2020 84.2  80.0 - 100.0 fL Final  . MCH 03/28/2020 26.1  26.0 - 34.0 pg Final  . MCHC 03/28/2020 31.0  30.0 - 36.0 g/dL Final  . RDW 03/28/2020 15.3  11.5 - 15.5 % Final  . Platelets 03/28/2020 241  150 - 400 K/uL Final  . nRBC 03/28/2020 0.0  0.0 - 0.2 % Final   Performed at [facility], [place] [address], [place], [place] [address]  . Sodium 03/28/2020 138  135 - 145 mmol/L Final  . Potassium 03/28/2020 4.1  3.5 - 5.1 mmol/L Final  . Chloride 03/28/2020 105  98 - 111 mmol/L Final  . CO2 03/28/2020 24  22 - 32 mmol/L Final  . Glucose, Bld 03/28/2020 79  70 - 99 mg/dL Final   Glucose reference range applies only to samples taken after fasting for at least 8 hours.  . BUN 03/28/2020 13  6 - 20 mg/dL Final  . Creatinine, Ser 03/28/2020 1.02  0.61 - 1.24 mg/dL Final  . Calcium 03/28/2020 9.0  8.9 - 10.3 mg/dL Final  . Total Protein 03/28/2020 7.6  6.5 - 8.1 g/dL Final  . Albumin 03/28/2020 4.0  3.5 - 5.0 g/dL Final  . AST 03/28/2020 19  15 - 41 U/L Final  . ALT 03/28/2020 14  0 - 44 U/L Final  . Alkaline Phosphatase 03/28/2020 79  38 - 126 U/L Final  . Total Bilirubin 03/28/2020 0.7  0.3 - 1.2 mg/dL Final  . GFR, Estimated 03/28/2020 >60  >60 mL/min Final  Comment: (NOTE) Calculated using the CKD-EPI Creatinine Equation (2021)   . Anion gap 03/28/2020 9  5 - 15 Final   Performed at [facility], [place] [address] [place], [place] [address]  . Alcohol, Ethyl (B) 03/28/2020 <10  <10  mg/dL Final   Comment: (NOTE) Lowest detectable limit for serum alcohol is 10 mg/dL.  For medical purposes only. Performed at [facility], [place] [address], [place], [place] [address]   . Opiates 03/28/2020 NONE DETECTED  NONE DETECTED Final  . Cocaine 03/28/2020 POSITIVE* NONE DETECTED Final  . Benzodiazepines 03/28/2020 NONE DETECTED  NONE DETECTED Final  . Amphetamines 03/28/2020 NONE DETECTED  NONE DETECTED Final  . Tetrahydrocannabinol 03/28/2020 NONE DETECTED  NONE DETECTED Final  . Barbiturates 03/28/2020 NONE DETECTED  NONE DETECTED Final   Comment: (NOTE) DRUG SCREEN FOR MEDICAL PURPOSES ONLY.  IF CONFIRMATION IS NEEDED FOR ANY PURPOSE, NOTIFY LAB WITHIN 5 DAYS.  LOWEST DETECTABLE LIMITS FOR URINE DRUG SCREEN Drug Class                     Cutoff (ng/mL) Amphetamine and metabolites    1000 Barbiturate and metabolites    200 Benzodiazepine                 423 Tricyclics and metabolites     300 Opiates and metabolites        300 Cocaine and metabolites        300 THC                            50 Performed at [facility], [place] [address] [place], [place] [address]   . Salicylate Lvl 43/15/4008 <7.0* 7.0 - 30.0 mg/dL Final   Performed at [place] [address] [place], [place] [address]  . Acetaminophen (Tylenol), Serum 03/28/2020 <10* 10 - 30 ug/mL Final   Comment: (NOTE) Therapeutic concentrations vary significantly. A range of 10-30 ug/mL  may be an effective concentration for many patients. However, some  are best treated at concentrations outside of this range. Acetaminophen concentrations >150 ug/mL at 4 hours after ingestion  and >50 ug/mL at 12 hours after ingestion are often associated with  toxic reactions.  Performed at [facility], [place] [address], [place],  [address]   . SARS Coronavirus 2 by RT PCR 03/28/2020 NEGATIVE  NEGATIVE Final   Comment:  (NOTE) SARS-CoV-2 target nucleic acids are NOT DETECTED.  The SARS-CoV-2 RNA is generally detectable in upper respiratory specimens during the acute phase of infection. The lowest concentration of SARS-CoV-2 viral copies this assay can detect is 138 copies/mL. A negative result does not preclude SARS-Cov-2 infection and should not be used as the sole basis for treatment or other patient management decisions. A negative result may occur with  improper specimen collection/handling, submission of specimen other than nasopharyngeal swab, presence of viral mutation(s) within the areas targeted by this assay, and inadequate number of viral copies(<138 copies/mL). A negative result must be combined with clinical observations, patient history, and epidemiological information. The expected result is Negative.  Fact Sheet for Patients:  EntrepreneurPulse.com.au  Fact Sheet for Healthcare Providers:  IncredibleEmployment.be  This test is no                          t yet approved or cleared by the [place] and  has been authorized for detection and/or diagnosis of SARS-CoV-2 by FDA under an Emergency Use Authorization (EUA). This EUA will remain  in effect (meaning this test can be used) for the duration of the COVID-19 declaration under Section 564(b)(1) of the Act, 21 U.S.C.section 360bbb-3(b)(1), unless the authorization is terminated  or revoked sooner.      . Influenza A by PCR 03/28/2020 NEGATIVE  NEGATIVE Final  . Influenza B by PCR 03/28/2020 NEGATIVE  NEGATIVE Final   Comment: (NOTE) The Xpert Xpress SARS-CoV-2/FLU/RSV plus assay is intended as an aid in the diagnosis of influenza from Nasopharyngeal swab specimens and should not be used as a sole basis for treatment. Nasal washings and aspirates are unacceptable for Xpert Xpress SARS-CoV-2/FLU/RSV testing.  Fact Sheet for Patients: EntrepreneurPulse.com.au  Fact  Sheet for Healthcare Providers: IncredibleEmployment.be  This test is not yet approved or cleared by the [place] FDA and has been authorized for detection and/or diagnosis of SARS-CoV-2 by FDA under an Emergency Use Authorization (EUA). This EUA will remain in effect (meaning this test can be used) for the duration of the COVID-19 declaration under Section 564(b)(1) of the Act, 21 U.S.C. section 360bbb-3(b)(1), unless the authorization is terminated or revoked.  Performed at [facility], [place] [address] [place], [place] [address]   Admission on 03/25/2020, Discharged on 03/26/2020  Component Date Value Ref Range Status  . Acetaminophen (Tylenol), Serum 03/25/2020 <10* 10 - 30 ug/mL Final   Comment: (NOTE) Therapeutic concentrations vary significantly. A range of 10-30 ug/mL  may be an effective concentration for many patients. However, some  are best treated at concentrations outside of this range. Acetaminophen concentrations >150 ug/mL at 4 hours after ingestion  and >50 ug/mL at 12 hours after ingestion are often associated with  toxic reactions.  Performed at [facility], [address] [place], [place] [address]   . Sodium 03/25/2020 139  135 - 145 mmol/L Final  . Potassium 03/25/2020 3.7  3.5 - 5.1 mmol/L Final  . Chloride 03/25/2020 103  98 - 111 mmol/L Final  . CO2 03/25/2020 22  22 - 32 mmol/L Final  . Glucose, Bld 03/25/2020 97  70 - 99 mg/dL Final   Glucose reference range applies only to samples taken after fasting for at least 8 hours.  . BUN 03/25/2020 21* 6 - 20 mg/dL Final  . Creatinine, Ser 03/25/2020 1.17  0.61 - 1.24 mg/dL Final  . Calcium 03/25/2020 9.5  8.9 - 10.3 mg/dL Final  . Total Protein 03/25/2020 8.1  6.5 - 8.1 g/dL Final  . Albumin 03/25/2020 4.4  3.5 - 5.0 g/dL Final  . AST 03/25/2020 34  15 - 41 U/L Final  . ALT 03/25/2020 16  0 - 44 U/L Final  . Alkaline Phosphatase 03/25/2020 78  38 - 126  U/L Final  . Total Bilirubin 03/25/2020 1.7* 0.3 - 1.2 mg/dL Final  . GFR, Estimated 03/25/2020 >60  >60 mL/min Final   Comment: (NOTE) Calculated using the CKD-EPI Creatinine Equation (2021)   . Anion gap 03/25/2020 14  5 - 15 Final   Performed at [facility], [place] [place], [place] [address]  . Alcohol, Ethyl (B) 03/25/2020 <10  <10 mg/dL Final   Comment: (NOTE) Lowest detectable limit for serum alcohol is 10 mg/dL.  For medical purposes only. Performed at [facility], [address] [place], [place] [address]   . Lipase 03/25/2020 28  11 - 51 U/L Final  Performed at [facility] [place], [address], [place], [place] [address]  . Salicylate Lvl 67/34/1937 <7.0* 7.0 - 30.0 mg/dL Final   Performed at [facility], [place] [place], [place] [address]  . WBC 03/25/2020 8.0  4.0 - 10.5 K/uL Final  . RBC 03/25/2020 5.27  4.22 - 5.81 MIL/uL Final  . Hemoglobin 03/25/2020 13.8  13.0 - 17.0 g/dL Final  . HCT 03/25/2020 42.8  39 - 52 % Final  . MCV 03/25/2020 81.2  80.0 - 100.0 fL Final  . MCH 03/25/2020 26.2  26.0 - 34.0 pg Final  . MCHC 03/25/2020 32.2  30.0 - 36.0 g/dL Final  . RDW 03/25/2020 14.7  11.5 - 15.5 % Final  . Platelets 03/25/2020 292  150 - 400 K/uL Final  . nRBC 03/25/2020 0.0  0.0 - 0.2 % Final  . Neutrophils Relative % 03/25/2020 39  % Final  . Neutro Abs 03/25/2020 3.1  1.7 - 7.7 K/uL Final  . Lymphocytes Relative 03/25/2020 42  % Final  . Lymphs Abs 03/25/2020 3.3  0.7 - 4.0 K/uL Final  . Monocytes Relative 03/25/2020 16  % Final  . Monocytes Absolute 03/25/2020 1.3* 0.1 - 1.0 K/uL Final  . Eosinophils Relative 03/25/2020 2  % Final  . Eosinophils Absolute 03/25/2020 0.2  0.0 - 0.5 K/uL Final  . Basophils Relative 03/25/2020 1  % Final  . Basophils Absolute 03/25/2020 0.1  0.0 - 0.1 K/uL Final  . Immature Granulocytes 03/25/2020 0  % Final  . Abs Immature Granulocytes 03/25/2020 0.02  0.00 - 0.07 K/uL Final    Performed at [facility], [address], [place], [place] [address]  . Tricyclic, Ur Screen 29/92/4268 NONE DETECTED  NONE DETECTED Final  . Amphetamines, Ur Screen 03/25/2020 NONE DETECTED  NONE DETECTED Final  . MDMA (Ecstasy)Ur Screen 03/25/2020 NONE DETECTED  NONE DETECTED Final  . Cocaine Metabolite,Ur [place] 03/25/2020 POSITIVE* NONE DETECTED Final  . Opiate, Ur Screen 03/25/2020 NONE DETECTED  NONE DETECTED Final  . Phencyclidine (PCP) Ur S 03/25/2020 NONE DETECTED  NONE DETECTED Final  . Cannabinoid 50 Ng, Ur [place] 03/25/2020 NONE DETECTED  NONE DETECTED Final  . Barbiturates, Ur Screen 03/25/2020 NONE DETECTED  NONE DETECTED Final  . Benzodiazepine, Ur Scrn 03/25/2020 NONE DETECTED  NONE DETECTED Final  . Methadone Scn, Ur 03/25/2020 NONE DETECTED  NONE DETECTED Final   Comment: (NOTE) Tricyclics + metabolites, urine    Cutoff 1000 ng/mL Amphetamines + metabolites, urine  Cutoff 1000 ng/mL MDMA (Ecstasy), urine              Cutoff 500 ng/mL Cocaine Metabolite, urine          Cutoff 300 ng/mL Opiate + metabolites, urine        Cutoff 300 ng/mL Phencyclidine (PCP), urine         Cutoff 25 ng/mL Cannabinoid, urine                 Cutoff 50 ng/mL Barbiturates + metabolites, urine  Cutoff 200 ng/mL Benzodiazepine, urine              Cutoff 200 ng/mL Methadone, urine                   Cutoff 300 ng/mL  The urine drug screen provides only a preliminary, unconfirmed analytical test result and should not be used for non-medical purposes. Clinical consideration and professional judgment should be applied to any positive drug screen result due  to possible interfering substances. A more specific alternate chemical method must be used in order to obtain a confirmed analytical result. Gas chromatography / mass spectrometry (GC/MS) is the preferred confirm                          atory method. Performed at [facility], [address], [place], [place] [address]   .  Acetaminophen (Tylenol), Serum 03/25/2020 <10* 10 - 30 ug/mL Final   Comment: (NOTE) Therapeutic concentrations vary significantly. A range of 10-30 ug/mL  may be an effective concentration for many patients. However, some  are best treated at concentrations outside of this range. Acetaminophen concentrations >150 ug/mL at 4 hours after ingestion  and >50 ug/mL at 12 hours after ingestion are often associated with  toxic reactions.  Performed at [facility], [address] [place], [place] [address]   . SARS Coronavirus 2 by RT PCR 03/25/2020 NEGATIVE  NEGATIVE Final   Comment: (NOTE) SARS-CoV-2 target nucleic acids are NOT DETECTED.  The SARS-CoV-2 RNA is generally detectable in upper respiratory specimens during the acute phase of infection. The lowest concentration of SARS-CoV-2 viral copies this assay can detect is 138 copies/mL. A negative result does not preclude SARS-Cov-2 infection and should not be used as the sole basis for treatment or other patient management decisions. A negative result may occur with  improper specimen collection/handling, submission of specimen other than nasopharyngeal swab, presence of viral mutation(s) within the areas targeted by this assay, and inadequate number of viral copies(<138 copies/mL). A negative result must be combined with clinical observations, patient history, and epidemiological information. The expected result is Negative.  Fact Sheet for Patients:  EntrepreneurPulse.com.au  Fact Sheet for Healthcare Providers:  IncredibleEmployment.be  This test is no                          t yet approved or cleared by the [place] FDA and  has been authorized for detection and/or diagnosis of SARS-CoV-2 by FDA under an Emergency Use Authorization (EUA). This EUA will remain  in effect (meaning this test can be used) for the duration of the COVID-19 declaration under Section 564(b)(1) of  the Act, 21 U.S.C.section 360bbb-3(b)(1), unless the authorization is terminated  or revoked sooner.      . Influenza A by PCR 03/25/2020 NEGATIVE  NEGATIVE Final  . Influenza B by PCR 03/25/2020 NEGATIVE  NEGATIVE Final   Comment: (NOTE) The Xpert Xpress SARS-CoV-2/FLU/RSV plus assay is intended as an aid in the diagnosis of influenza from Nasopharyngeal swab specimens and should not be used as a sole basis for treatment. Nasal washings and aspirates are unacceptable for Xpert Xpress SARS-CoV-2/FLU/RSV testing.  Fact Sheet for Patients: EntrepreneurPulse.com.au  Fact Sheet for Healthcare Providers: IncredibleEmployment.be  This test is not yet approved or cleared by the [place] FDA and has been authorized for detection and/or diagnosis of SARS-CoV-2 by FDA under an Emergency Use Authorization (EUA). This EUA will remain in effect (meaning this test can be used) for the duration of the COVID-19 declaration under Section 564(b)(1) of the Act, 21 U.S.C. section 360bbb-3(b)(1), unless the authorization is terminated or revoked.  Performed at [facility], [address] [place], [place] [address]   Admission on 02/16/2020, Discharged on 02/24/2020  Component Date Value Ref Range Status  . Cholesterol 02/16/2020 171  0 - 200 mg/dL Final  . Triglycerides 02/16/2020 330* <  150 mg/dL Final  . HDL 02/16/2020 36* >40 mg/dL Final  . Total CHOL/HDL Ratio 02/16/2020 4.8  RATIO Final  . VLDL 02/16/2020 66* 0 - 40 mg/dL Final  . LDL Cholesterol 02/16/2020 69  0 - 99 mg/dL Final   Comment:        Total Cholesterol/HDL:CHD Risk Coronary Heart Disease Risk Table                     Men   Women  1/2 Average Risk   3.4   3.3  Average Risk       5.0   4.4  2 X Average Risk   9.6   7.1  3 X Average Risk  23.4   11.0        Use the calculated Patient Ratio above and the CHD Risk Table to determine the patient's CHD Risk.        ATP III  CLASSIFICATION (LDL):  <100     mg/dL   Optimal  100-129  mg/dL   Near or Above                    Optimal  130-159  mg/dL   Borderline  160-189  mg/dL   High  >190     mg/dL   Very High Performed at [facility], [address], [place], [place] [address]   . Hgb A1c MFr Bld 02/16/2020 5.7* 4.8 - 5.6 % Final   Comment: (NOTE)         Prediabetes: 5.7 - 6.4         Diabetes: >6.4         Glycemic control for adults with diabetes: <7.0   . Mean Plasma Glucose 02/16/2020 117  mg/dL Final   Comment: (NOTE) Performed At: [facility] [place], [place] [id number] Rush Farmer MD [id number]   Admission on 02/14/2020, Discharged on 02/16/2020  Component Date Value Ref Range Status  . Tricyclic, Ur Screen 36/46/8032 NONE DETECTED  NONE DETECTED Final  . Amphetamines, Ur Screen 02/15/2020 NONE DETECTED  NONE DETECTED Final  . MDMA (Ecstasy)Ur Screen 02/15/2020 NONE DETECTED  NONE DETECTED Final  . Cocaine Metabolite,Ur [place] 02/15/2020 POSITIVE* NONE DETECTED Final  . Opiate, Ur Screen 02/15/2020 NONE DETECTED  NONE DETECTED Final  . Phencyclidine (PCP) Ur S 02/15/2020 NONE DETECTED  NONE DETECTED Final  . Cannabinoid 50 Ng, Ur [place] 02/15/2020 NONE DETECTED  NONE DETECTED Final  . Barbiturates, Ur Screen 02/15/2020 NONE DETECTED  NONE DETECTED Final  . Benzodiazepine, Ur Scrn 02/15/2020 NONE DETECTED  NONE DETECTED Final  . Methadone Scn, Ur 02/15/2020 NONE DETECTED  NONE DETECTED Final   Comment: (NOTE) Tricyclics + metabolites, urine    Cutoff 1000 ng/mL Amphetamines + metabolites, urine  Cutoff 1000 ng/mL MDMA (Ecstasy), urine              Cutoff 500 ng/mL Cocaine Metabolite, urine          Cutoff 300 ng/mL Opiate + metabolites, urine        Cutoff 300 ng/mL Phencyclidine (PCP), urine         Cutoff 25 ng/mL Cannabinoid, urine                 Cutoff 50 ng/mL Barbiturates + metabolites, urine  Cutoff 200 ng/mL Benzodiazepine, urine               Cutoff 200 ng/mL Methadone, urine  Cutoff 300 ng/mL  The urine drug screen provides only a preliminary, unconfirmed analytical test result and should not be used for non-medical purposes. Clinical consideration and professional judgment should be applied to any positive drug screen result due to possible interfering substances. A more specific alternate chemical method must be used in order to obtain a confirmed analytical result. Gas chromatography / mass spectrometry (GC/MS) is the preferred confirm                          atory method. Performed at [facility], [address] [place], [place] [address]   . Acetaminophen (Tylenol), Serum 02/14/2020 <10* 10 - 30 ug/mL Final   Comment: (NOTE) Therapeutic concentrations vary significantly. A range of 10-30 ug/mL  may be an effective concentration for many patients. However, some  are best treated at concentrations outside of this range. Acetaminophen concentrations >150 ug/mL at 4 hours after ingestion  and >50 ug/mL at 12 hours after ingestion are often associated with  toxic reactions.  Performed at [facility], [address] [place], [place] [address]   . WBC 02/14/2020 8.5  4.0 - 10.5 K/uL Final  . RBC 02/14/2020 5.35  4.22 - 5.81 MIL/uL Final  . Hemoglobin 02/14/2020 13.9  13.0 - 17.0 g/dL Final  . HCT 02/14/2020 42.9  39 - 52 % Final  . MCV 02/14/2020 80.2  80.0 - 100.0 fL Final  . MCH 02/14/2020 26.0  26.0 - 34.0 pg Final  . MCHC 02/14/2020 32.4  30.0 - 36.0 g/dL Final  . RDW 02/14/2020 14.5  11.5 - 15.5 % Final  . Platelets 02/14/2020 331  150 - 400 K/uL Final  . nRBC 02/14/2020 0.0  0.0 - 0.2 % Final  . Neutrophils Relative % 02/14/2020 61  % Final  . Neutro Abs 02/14/2020 5.2  1.7 - 7.7 K/uL Final  . Lymphocytes Relative 02/14/2020 26  % Final  . Lymphs Abs 02/14/2020 2.2  0.7 - 4.0 K/uL Final  . Monocytes Relative 02/14/2020 10  % Final  . Monocytes Absolute 02/14/2020 0.9   0.1 - 1.0 K/uL Final  . Eosinophils Relative 02/14/2020 2  % Final  . Eosinophils Absolute 02/14/2020 0.1  0.0 - 0.5 K/uL Final  . Basophils Relative 02/14/2020 1  % Final  . Basophils Absolute 02/14/2020 0.0  0.0 - 0.1 K/uL Final  . Immature Granulocytes 02/14/2020 0  % Final  . Abs Immature Granulocytes 02/14/2020 0.03  0.00 - 0.07 K/uL Final   Performed at [facility], [address] [place], [place] [address]  . Sodium 02/14/2020 141  135 - 145 mmol/L Final  . Potassium 02/14/2020 3.4* 3.5 - 5.1 mmol/L Final  . Chloride 02/14/2020 107  98 - 111 mmol/L Final  . CO2 02/14/2020 27  22 - 32 mmol/L Final  . Glucose, Bld 02/14/2020 105* 70 - 99 mg/dL Final   Glucose reference range applies only to samples taken after fasting for at least 8 hours.  . BUN 02/14/2020 17  6 - 20 mg/dL Final  . Creatinine, Ser 02/14/2020 0.83  0.61 - 1.24 mg/dL Final  . Calcium 02/14/2020 9.0  8.9 - 10.3 mg/dL Final  . Total Protein 02/14/2020 7.2  6.5 - 8.1 g/dL Final  . Albumin 02/14/2020 3.8  3.5 - 5.0 g/dL Final  . AST 02/14/2020 19  15 - 41 U/L Final  . ALT 02/14/2020 14  0 - 44 U/L Final  .  Alkaline Phosphatase 02/14/2020 80  38 - 126 U/L Final  . Total Bilirubin 02/14/2020 0.5  0.3 - 1.2 mg/dL Final  . GFR, Estimated 02/14/2020 >60  >60 mL/min Final   Comment: (NOTE) Calculated using the CKD-EPI Creatinine Equation (2021)   . Anion gap 02/14/2020 7  5 - 15 Final   Performed at [facility], [address] [place], [place] [address]  . Alcohol, Ethyl (B) 02/14/2020 <10  <10 mg/dL Final   Comment: (NOTE) Lowest detectable limit for serum alcohol is 10 mg/dL.  For medical purposes only. Performed at [facility], [address], [place], [place] [address]   . Lipase 02/14/2020 37  11 - 51 U/L Final   Performed at [facility], [address] [place], [place] [address]  . Salicylate Lvl 23/55/7322 <7.0* 7.0 - 30.0 mg/dL Final   Performed at [facility], [address] [place], [place] [address]  . Acetaminophen (Tylenol), Serum 02/15/2020 <10* 10 - 30 ug/mL Final   Comment: (NOTE) Therapeutic concentrations vary significantly. A range of 10-30 ug/mL  may be an effective concentration for many patients. However, some  are best treated at concentrations outside of this range. Acetaminophen concentrations >150 ug/mL at 4 hours after ingestion  and >50 ug/mL at 12 hours after ingestion are often associated with  toxic reactions.  Performed at [facility], [address] [place], [place] [address]   . Salicylate Lvl 76/28/3151 <7.0* 7.0 - 30.0 mg/dL Final   Performed at [facility], [address] [place], [place] [address]  . SARS Coronavirus 2 by RT PCR 02/15/2020 NEGATIVE  NEGATIVE Final   Comment: (NOTE) SARS-CoV-2 target nucleic acids are NOT DETECTED.  The SARS-CoV-2 RNA is generally detectable in upper respiratoy specimens during the acute phase of infection. The lowest concentration of SARS-CoV-2 viral copies this assay can detect is 131 copies/mL. A negative result does not preclude SARS-Cov-2 infection and should not be used as the sole basis for treatment or other patient management decisions. A negative result may occur with  improper specimen collection/handling, submission of specimen other than nasopharyngeal swab, presence of viral mutation(s) within the areas targeted by this assay, and inadequate number of viral copies (<131 copies/mL). A negative result must be combined with clinical observations, patient history, and epidemiological information. The expected result is Negative.  Fact Sheet for Patients:  PinkCheek.be  Fact Sheet for Healthcare Providers:  GravelBags.it  This test is no                          t yet approved or cleared by the [place] FDA and  has been authorized for detection and/or diagnosis  of SARS-CoV-2 by FDA under an Emergency Use Authorization (EUA). This EUA will remain  in effect (meaning this test can be used) for the duration of the COVID-19 declaration under Section 564(b)(1) of the Act, 21 U.S.C. section 360bbb-3(b)(1), unless the authorization is terminated or revoked sooner.    . Influenza A by PCR 02/15/2020 NEGATIVE  NEGATIVE Final  . Influenza B by PCR 02/15/2020 NEGATIVE  NEGATIVE Final   Comment: (NOTE) The Xpert Xpress SARS-CoV-2/FLU/RSV assay is intended as an aid in  the diagnosis of influenza from Nasopharyngeal swab specimens and  should not be used as a sole basis for treatment. Nasal washings and  aspirates are unacceptable for Xpert Xpress SARS-CoV-2/FLU/RSV  testing.  Fact Sheet for Patients: PinkCheek.be  Fact  Sheet for Healthcare Providers: GravelBags.it  This test is not yet approved or cleared by the [place] and  has been authorized for detection and/or diagnosis of SARS-CoV-2 by  FDA under an Emergency Use Authorization (EUA). This EUA will remain  in effect (meaning this test can be used) for the duration of the  Covid-19 declaration under Section 564(b)(1) of the Act, 21  U.S.C. section 360bbb-3(b)(1), unless the authorization is  terminated or revoked. Performed at [facility], [address] [place], [place] [address]   Admission on 02/12/2020, Discharged on 02/13/2020  Component Date Value Ref Range Status  . Sodium 02/12/2020 139  135 - 145 mmol/L Final  . Potassium 02/12/2020 4.1  3.5 - 5.1 mmol/L Final  . Chloride 02/12/2020 106  98 - 111 mmol/L Final  . CO2 02/12/2020 25  22 - 32 mmol/L Final  . Glucose, Bld 02/12/2020 106* 70 - 99 mg/dL Final   Glucose reference range applies only to samples taken after fasting for at least 8 hours.  . BUN 02/12/2020 18  6 - 20 mg/dL Final  . Creatinine, Ser 02/12/2020 1.00  0.61 - 1.24 mg/dL Final  . Calcium  02/12/2020 9.4  8.9 - 10.3 mg/dL Final  . Total Protein 02/12/2020 7.4  6.5 - 8.1 g/dL Final  . Albumin 02/12/2020 3.8  3.5 - 5.0 g/dL Final  . AST 02/12/2020 20  15 - 41 U/L Final  . ALT 02/12/2020 18  0 - 44 U/L Final  . Alkaline Phosphatase 02/12/2020 91  38 - 126 U/L Final  . Total Bilirubin 02/12/2020 0.5  0.3 - 1.2 mg/dL Final  . GFR, Estimated 02/12/2020 >60  >60 mL/min Final   Comment: (NOTE) Calculated using the CKD-EPI Creatinine Equation (2021)   . Anion gap 02/12/2020 8  5 - 15 Final   Performed at [facility], [address] [place], [place] [address]  . Alcohol, Ethyl (B) 02/12/2020 <10  <10 mg/dL Final   Comment: (NOTE) Lowest detectable limit for serum alcohol is 10 mg/dL.  For medical purposes only. Performed at [facility], [address], [place], [place] [address]   . Salicylate Lvl 93/73/4287 <7.0* 7.0 - 30.0 mg/dL Final   Performed at [facility], [address] [place], [place] [address]  . Acetaminophen (Tylenol), Serum 02/12/2020 <10* 10 - 30 ug/mL Final   Comment: (NOTE) Therapeutic concentrations vary significantly. A range of 10-30 ug/mL  may be an effective concentration for many patients. However, some  are best treated at concentrations outside of this range. Acetaminophen concentrations >150 ug/mL at 4 hours after ingestion  and >50 ug/mL at 12 hours after ingestion are often associated with  toxic reactions.  Performed at [facility], [address] [place], [place] [address]   . WBC 02/12/2020 8.5  4.0 - 10.5 K/uL Final  . RBC 02/12/2020 5.45  4.22 - 5.81 MIL/uL Final  . Hemoglobin 02/12/2020 14.4  13.0 - 17.0 g/dL Final  . HCT 02/12/2020 44.0  39 - 52 % Final  . MCV 02/12/2020 80.7  80.0 - 100.0 fL Final  . MCH 02/12/2020 26.4  26.0 - 34.0 pg Final  . MCHC 02/12/2020 32.7  30.0 - 36.0 g/dL Final  . RDW 02/12/2020 14.4  11.5 - 15.5 % Final  . Platelets 02/12/2020 313  150 - 400 K/uL Final  .  nRBC 02/12/2020 0.0  0.0 - 0.2 % Final   Performed at [facility]  [address], [place], [place] [address]  Admission on 02/12/2020, Discharged on 02/12/2020  Component Date Value Ref Range Status  . Sodium 02/12/2020 139  135 - 145 mmol/L Final  . Potassium 02/12/2020 3.6  3.5 - 5.1 mmol/L Final  . Chloride 02/12/2020 106  98 - 111 mmol/L Final  . CO2 02/12/2020 24  22 - 32 mmol/L Final  . Glucose, Bld 02/12/2020 99  70 - 99 mg/dL Final   Glucose reference range applies only to samples taken after fasting for at least 8 hours.  . BUN 02/12/2020 19  6 - 20 mg/dL Final  . Creatinine, Ser 02/12/2020 0.88  0.61 - 1.24 mg/dL Final  . Calcium 02/12/2020 8.9  8.9 - 10.3 mg/dL Final  . Total Protein 02/12/2020 7.5  6.5 - 8.1 g/dL Final  . Albumin 02/12/2020 4.0  3.5 - 5.0 g/dL Final  . AST 02/12/2020 23  15 - 41 U/L Final  . ALT 02/12/2020 19  0 - 44 U/L Final  . Alkaline Phosphatase 02/12/2020 86  38 - 126 U/L Final  . Total Bilirubin 02/12/2020 0.9  0.3 - 1.2 mg/dL Final  . GFR, Estimated 02/12/2020 >60  >60 mL/min Final  . Anion gap 02/12/2020 9  5 - 15 Final   Performed at [facility], [address], [place], [place] [address]  . Alcohol, Ethyl (B) 02/12/2020 <10  <10 mg/dL Final   Comment: (NOTE) Lowest detectable limit for serum alcohol is 10 mg/dL.  For medical purposes only. Performed at [facility], [address] [place], [place] [address]   . Salicylate Lvl 83/66/2947 <7.0* 7.0 - 30.0 mg/dL Final   Performed at [facility], [address] [place], [place] [address]  . Acetaminophen (Tylenol), Serum 02/12/2020 <10* 10 - 30 ug/mL Final   Comment: (NOTE) Therapeutic concentrations vary significantly. A range of 10-30 ug/mL  may be an effective concentration for many patients. However, some  are best treated at concentrations outside of this range. Acetaminophen concentrations >150 ug/mL at 4 hours after ingestion  and >50 ug/mL  at 12 hours after ingestion are often associated with  toxic reactions.  Performed at [facility], [address], [place], [place] [address]   . WBC 02/12/2020 10.2  4.0 - 10.5 K/uL Final  . RBC 02/12/2020 5.29  4.22 - 5.81 MIL/uL Final  . Hemoglobin 02/12/2020 14.1  13.0 - 17.0 g/dL Final  . HCT 02/12/2020 42.5  39 - 52 % Final  . MCV 02/12/2020 80.3  80.0 - 100.0 fL Final  . MCH 02/12/2020 26.7  26.0 - 34.0 pg Final  . MCHC 02/12/2020 33.2  30.0 - 36.0 g/dL Final  . RDW 02/12/2020 14.1  11.5 - 15.5 % Final  . Platelets 02/12/2020 295  150 - 400 K/uL Final  . nRBC 02/12/2020 0.0  0.0 - 0.2 % Final   Performed at [facility], [address] [place], [place] [address]  . Tricyclic, Ur Screen 75/17/0017 NONE DETECTED  NONE DETECTED Final  . Amphetamines, Ur Screen 02/12/2020 NONE DETECTED  NONE DETECTED Final  . MDMA (Ecstasy)Ur Screen 02/12/2020 NONE DETECTED  NONE DETECTED Final  . Cocaine Metabolite,Ur [place] 02/12/2020 POSITIVE* NONE DETECTED Final  . Opiate, Ur Screen 02/12/2020 NONE DETECTED  NONE DETECTED Final  . Phencyclidine (PCP) Ur S 02/12/2020 NONE DETECTED  NONE DETECTED Final  . Cannabinoid 50 Ng, Ur [place] 02/12/2020 NONE DETECTED  NONE DETECTED Final  . Barbiturates, Ur Screen 02/12/2020 NONE  DETECTED  NONE DETECTED Final  . Benzodiazepine, Ur Scrn 02/12/2020 NONE DETECTED  NONE DETECTED Final  . Methadone Scn, Ur 02/12/2020 NONE DETECTED  NONE DETECTED Final   Comment: (NOTE) Tricyclics + metabolites, urine    Cutoff 1000 ng/mL Amphetamines + metabolites, urine  Cutoff 1000 ng/mL MDMA (Ecstasy), urine              Cutoff 500 ng/mL Cocaine Metabolite, urine          Cutoff 300 ng/mL Opiate + metabolites, urine        Cutoff 300 ng/mL Phencyclidine (PCP), urine         Cutoff 25 ng/mL Cannabinoid, urine                 Cutoff 50 ng/mL Barbiturates + metabolites, urine  Cutoff 200 ng/mL Benzodiazepine, urine              Cutoff 200 ng/mL Methadone,  urine                   Cutoff 300 ng/mL  The urine drug screen provides only a preliminary, unconfirmed analytical test result and should not be used for non-medical purposes. Clinical consideration and professional judgment should be applied to any positive drug screen result due to possible interfering substances. A more specific alternate chemical method must be used in order to obtain a confirmed analytical result. Gas chromatography / mass spectrometry (GC/MS) is the preferred confirm                          atory method. Performed at [facility], [address], [place], [place] [address]   . SARS Coronavirus 2 by RT PCR 02/12/2020 NEGATIVE  NEGATIVE Final   Comment: (NOTE) SARS-CoV-2 target nucleic acids are NOT DETECTED.  The SARS-CoV-2 RNA is generally detectable in upper respiratoy specimens during the acute phase of infection. The lowest concentration of SARS-CoV-2 viral copies this assay can detect is 131 copies/mL. A negative result does not preclude SARS-Cov-2 infection and should not be used as the sole basis for treatment or other patient management decisions. A negative result may occur with  improper specimen collection/handling, submission of specimen other than nasopharyngeal swab, presence of viral mutation(s) within the areas targeted by this assay, and inadequate number of viral copies (<131 copies/mL). A negative result must be combined with clinical observations, patient history, and epidemiological information. The expected result is Negative.  Fact Sheet for Patients:  PinkCheek.be  Fact Sheet for Healthcare Providers:  GravelBags.it  This test is no                          t yet approved or cleared by the [place] FDA and  has been authorized for detection and/or diagnosis of SARS-CoV-2 by FDA under an Emergency Use Authorization (EUA). This EUA will remain  in effect  (meaning this test can be used) for the duration of the COVID-19 declaration under Section 564(b)(1) of the Act, 21 U.S.C. section 360bbb-3(b)(1), unless the authorization is terminated or revoked sooner.    . Influenza A by PCR 02/12/2020 NEGATIVE  NEGATIVE Final  . Influenza B by PCR 02/12/2020 NEGATIVE  NEGATIVE Final   Comment: (NOTE) The Xpert Xpress SARS-CoV-2/FLU/RSV assay is intended as an aid in  the diagnosis of influenza from Nasopharyngeal swab specimens and  should not be used as a sole basis for treatment. Nasal washings  and  aspirates are unacceptable for Xpert Xpress SARS-CoV-2/FLU/RSV  testing.  Fact Sheet for Patients: PinkCheek.be  Fact Sheet for Healthcare Providers: GravelBags.it  This test is not yet approved or cleared by the [place] FDA and  has been authorized for detection and/or diagnosis of SARS-CoV-2 by  FDA under an Emergency Use Authorization (EUA). This EUA will remain  in effect (meaning this test can be used) for the duration of the  Covid-19 declaration under Section 564(b)(1) of the Act, 21  U.S.C. section 360bbb-3(b)(1), unless the authorization is  terminated or revoked. Performed at [facility] [address], [place], [place] [address]   Admission on 01/28/2020, Discharged on 01/29/2020  Component Date Value Ref Range Status  . Sodium 01/28/2020 141  135 - 145 mmol/L Final  . Potassium 01/28/2020 4.1  3.5 - 5.1 mmol/L Final  . Chloride 01/28/2020 104  98 - 111 mmol/L Final  . CO2 01/28/2020 27  22 - 32 mmol/L Final  . Glucose, Bld 01/28/2020 71  70 - 99 mg/dL Final   Glucose reference range applies only to samples taken after fasting for at least 8 hours.  . BUN 01/28/2020 14  6 - 20 mg/dL Final  . Creatinine, Ser 01/28/2020 0.98  0.61 - 1.24 mg/dL Final  . Calcium 01/28/2020 9.4  8.9 - 10.3 mg/dL Final  . Total Protein 01/28/2020 8.0  6.5 - 8.1 g/dL Final  .  Albumin 01/28/2020 4.1  3.5 - 5.0 g/dL Final  . AST 01/28/2020 20  15 - 41 U/L Final  . ALT 01/28/2020 17  0 - 44 U/L Final  . Alkaline Phosphatase 01/28/2020 99  38 - 126 U/L Final  . Total Bilirubin 01/28/2020 0.9  0.3 - 1.2 mg/dL Final  . GFR calc non Af Amer 01/28/2020 >60  >60 mL/min Final  . Anion gap 01/28/2020 10  5 - 15 Final   Performed at [facility], [address], [place], [place] [address]  . Alcohol, Ethyl (B) 01/28/2020 <10  <10 mg/dL Final   Comment: (NOTE) Lowest detectable limit for serum alcohol is 10 mg/dL.  For medical purposes only. Performed at [facility], [address] [place], [place] [address]   . Salicylate Lvl 15/83/0940 <7.0* 7.0 - 30.0 mg/dL Final   Performed at [facility], [address] [place], [place] [address]  . Acetaminophen (Tylenol), Serum 01/28/2020 <10* 10 - 30 ug/mL Final   Comment: (NOTE) Therapeutic concentrations vary significantly. A range of 10-30 ug/mL  may be an effective concentration for many patients. However, some  are best treated at concentrations outside of this range. Acetaminophen concentrations >150 ug/mL at 4 hours after ingestion  and >50 ug/mL at 12 hours after ingestion are often associated with  toxic reactions.  Performed at [facility], [address] [place], [place] [address]   . WBC 01/28/2020 10.7* 4.0 - 10.5 K/uL Final  . RBC 01/28/2020 5.78  4.22 - 5.81 MIL/uL Final  . Hemoglobin 01/28/2020 15.4  13.0 - 17.0 g/dL Final  . HCT 01/28/2020 48.0  39 - 52 % Final  . MCV 01/28/2020 83.0  80.0 - 100.0 fL Final  . MCH 01/28/2020 26.6  26.0 - 34.0 pg Final  . MCHC 01/28/2020 32.1  30.0 - 36.0 g/dL Final  . RDW 01/28/2020 14.5  11.5 - 15.5 % Final  . Platelets 01/28/2020 284  150 - 400 K/uL Final  . nRBC 01/28/2020 0.0  0.0 - 0.2 %  Final   Performed at [facility], [address], [place], [place] [address]  . Tricyclic, Ur Screen 29/56/2130 NONE  DETECTED  NONE DETECTED Final  . Amphetamines, Ur Screen 01/28/2020 NONE DETECTED  NONE DETECTED Final  . MDMA (Ecstasy)Ur Screen 01/28/2020 NONE DETECTED  NONE DETECTED Final  . Cocaine Metabolite,Ur [place] 01/28/2020 POSITIVE* NONE DETECTED Final  . Opiate, Ur Screen 01/28/2020 NONE DETECTED  NONE DETECTED Final  . Phencyclidine (PCP) Ur S 01/28/2020 NONE DETECTED  NONE DETECTED Final  . Cannabinoid 50 Ng, Ur [place] 01/28/2020 NONE DETECTED  NONE DETECTED Final  . Barbiturates, Ur Screen 01/28/2020 NONE DETECTED  NONE DETECTED Final  . Benzodiazepine, Ur Scrn 01/28/2020 NONE DETECTED  NONE DETECTED Final  . Methadone Scn, Ur 01/28/2020 NONE DETECTED  NONE DETECTED Final   Comment: (NOTE) Tricyclics + metabolites, urine    Cutoff 1000 ng/mL Amphetamines + metabolites, urine  Cutoff 1000 ng/mL MDMA (Ecstasy), urine              Cutoff 500 ng/mL Cocaine Metabolite, urine          Cutoff 300 ng/mL Opiate + metabolites, urine        Cutoff 300 ng/mL Phencyclidine (PCP), urine         Cutoff 25 ng/mL Cannabinoid, urine                 Cutoff 50 ng/mL Barbiturates + metabolites, urine  Cutoff 200 ng/mL Benzodiazepine, urine              Cutoff 200 ng/mL Methadone, urine                   Cutoff 300 ng/mL  The urine drug screen provides only a preliminary, unconfirmed analytical test result and should not be used for non-medical purposes. Clinical consideration and professional judgment should be applied to any positive drug screen result due to possible interfering substances. A more specific alternate chemical method must be used in order to obtain a confirmed analytical result. Gas chromatography / mass spectrometry (GC/MS) is the preferred confirm                          atory method. Performed at [facility], [address] [place], [place] [address]   . SARS Coronavirus 2 by RT PCR 01/28/2020 NEGATIVE  NEGATIVE Final   Comment: (NOTE) SARS-CoV-2 target nucleic acids are NOT  DETECTED.  The SARS-CoV-2 RNA is generally detectable in upper respiratoy specimens during the acute phase of infection. The lowest concentration of SARS-CoV-2 viral copies this assay can detect is 131 copies/mL. A negative result does not preclude SARS-Cov-2 infection and should not be used as the sole basis for treatment or other patient management decisions. A negative result may occur with  improper specimen collection/handling, submission of specimen other than nasopharyngeal swab, presence of viral mutation(s) within the areas targeted by this assay, and inadequate number of viral copies (<131 copies/mL). A negative result must be combined with clinical observations, patient history, and epidemiological information. The expected result is Negative.  Fact Sheet for Patients:  PinkCheek.be  Fact Sheet for Healthcare Providers:  GravelBags.it  This test is no                          t yet approved or cleared by the [place] FDA and  has been authorized for detection and/or diagnosis of SARS-CoV-2 by  FDA under an Emergency Use Authorization (EUA). This EUA will remain  in effect (meaning this test can be used) for the duration of the COVID-19 declaration under Section 564(b)(1) of the Act, 21 U.S.C. section 360bbb-3(b)(1), unless the authorization is terminated or revoked sooner.    . Influenza A by PCR 01/28/2020 NEGATIVE  NEGATIVE Final  . Influenza B by PCR 01/28/2020 NEGATIVE  NEGATIVE Final   Comment: (NOTE) The Xpert Xpress SARS-CoV-2/FLU/RSV assay is intended as an aid in  the diagnosis of influenza from Nasopharyngeal swab specimens and  should not be used as a sole basis for treatment. Nasal washings and  aspirates are unacceptable for Xpert Xpress SARS-CoV-2/FLU/RSV  testing.  Fact Sheet for Patients: PinkCheek.be  Fact Sheet for Healthcare  Providers: GravelBags.it  This test is not yet approved or cleared by the [place] FDA and  has been authorized for detection and/or diagnosis of SARS-CoV-2 by  FDA under an Emergency Use Authorization (EUA). This EUA will remain  in effect (meaning this test can be used) for the duration of the  Covid-19 declaration under Section 564(b)(1) of the Act, 21  U.S.C. section 360bbb-3(b)(1), unless the authorization is  terminated or revoked. Performed at [facility], [address], [place], [place] [address]   Admission on 01/20/2020, Discharged on 01/20/2020  Component Date Value Ref Range Status  . Sodium 01/20/2020 137  135 - 145 mmol/L Final  . Potassium 01/20/2020 3.7  3.5 - 5.1 mmol/L Final  . Chloride 01/20/2020 106  98 - 111 mmol/L Final  . CO2 01/20/2020 25  22 - 32 mmol/L Final  . Glucose, Bld 01/20/2020 103* 70 - 99 mg/dL Final   Glucose reference range applies only to samples taken after fasting for at least 8 hours.  . BUN 01/20/2020 14  6 - 20 mg/dL Final  . Creatinine, Ser 01/20/2020 0.92  0.61 - 1.24 mg/dL Final  . Calcium 01/20/2020 8.9  8.9 - 10.3 mg/dL Final  . Total Protein 01/20/2020 6.8  6.5 - 8.1 g/dL Final  . Albumin 01/20/2020 3.8  3.5 - 5.0 g/dL Final  . AST 01/20/2020 22  15 - 41 U/L Final  . ALT 01/20/2020 17  0 - 44 U/L Final  . Alkaline Phosphatase 01/20/2020 81  38 - 126 U/L Final  . Total Bilirubin 01/20/2020 0.7  0.3 - 1.2 mg/dL Final  . GFR calc non Af Amer 01/20/2020 >60  >60 mL/min Final  . GFR calc Af Amer 01/20/2020 >60  >60 mL/min Final  . Anion gap 01/20/2020 6  5 - 15 Final   Performed at [facility], [address], [place], [place] [address]  . Alcohol, Ethyl (B) 01/20/2020 <10  <10 mg/dL Final   Comment: (NOTE) Lowest detectable limit for serum alcohol is 10 mg/dL.  For medical purposes only. Performed at [facility], [address], [place], [place] [address]   .  Salicylate Lvl 52/84/1324 <7.0* 7.0 - 30.0 mg/dL Final   Performed at [facility], [address] [place], [place] [address]  . Acetaminophen (Tylenol), Serum 01/20/2020 <10* 10 - 30 ug/mL Final   Comment: (NOTE) Therapeutic concentrations vary significantly. A range of 10-30 ug/mL  may be an effective concentration for many patients. However, some  are best treated at concentrations outside of this range. Acetaminophen concentrations >150 ug/mL at 4 hours after ingestion  and >50 ug/mL at 12 hours after ingestion are often associated with  toxic reactions.  Performed at  [facility], [address], [place], [place] [address]   . WBC 01/20/2020 6.6  4.0 - 10.5 K/uL Final  . RBC 01/20/2020 5.29  4.22 - 5.81 MIL/uL Final  . Hemoglobin 01/20/2020 14.2  13.0 - 17.0 g/dL Final  . HCT 01/20/2020 43.2  39 - 52 % Final  . MCV 01/20/2020 81.7  80.0 - 100.0 fL Final  . MCH 01/20/2020 26.8  26.0 - 34.0 pg Final  . MCHC 01/20/2020 32.9  30.0 - 36.0 g/dL Final  . RDW 01/20/2020 14.7  11.5 - 15.5 % Final  . Platelets 01/20/2020 287  150 - 400 K/uL Final  . nRBC 01/20/2020 0.0  0.0 - 0.2 % Final   Performed at [facility], [address], [place], [place] [address]  . Tricyclic, Ur Screen 87/86/7672 NONE DETECTED  NONE DETECTED Final  . Amphetamines, Ur Screen 01/20/2020 NONE DETECTED  NONE DETECTED Final  . MDMA (Ecstasy)Ur Screen 01/20/2020 NONE DETECTED  NONE DETECTED Final  . Cocaine Metabolite,Ur [place] 01/20/2020 POSITIVE* NONE DETECTED Final  . Opiate, Ur Screen 01/20/2020 NONE DETECTED  NONE DETECTED Final  . Phencyclidine (PCP) Ur S 01/20/2020 NONE DETECTED  NONE DETECTED Final  . Cannabinoid 50 Ng, Ur [place] 01/20/2020 NONE DETECTED  NONE DETECTED Final  . Barbiturates, Ur Screen 01/20/2020 NONE DETECTED  NONE DETECTED Final  . Benzodiazepine, Ur Scrn 01/20/2020 NONE DETECTED  NONE DETECTED Final  . Methadone Scn, Ur 01/20/2020 NONE DETECTED  NONE DETECTED Final    Comment: (NOTE) Tricyclics + metabolites, urine    Cutoff 1000 ng/mL Amphetamines + metabolites, urine  Cutoff 1000 ng/mL MDMA (Ecstasy), urine              Cutoff 500 ng/mL Cocaine Metabolite, urine          Cutoff 300 ng/mL Opiate + metabolites, urine        Cutoff 300 ng/mL Phencyclidine (PCP), urine         Cutoff 25 ng/mL Cannabinoid, urine                 Cutoff 50 ng/mL Barbiturates + metabolites, urine  Cutoff 200 ng/mL Benzodiazepine, urine              Cutoff 200 ng/mL Methadone, urine                   Cutoff 300 ng/mL  The urine drug screen provides only a preliminary, unconfirmed analytical test result and should not be used for non-medical purposes. Clinical consideration and professional judgment should be applied to any positive drug screen result due to possible interfering substances. A more specific alternate chemical method must be used in order to obtain a confirmed analytical result. Gas chromatography / mass spectrometry (GC/MS) is the preferred confirm                          atory method. Performed at [facility], [address], [place], [place] [address]   . SARS Coronavirus 2 by RT PCR 01/20/2020 NEGATIVE  NEGATIVE Final   Comment: (NOTE) SARS-CoV-2 target nucleic acids are NOT DETECTED.  The SARS-CoV-2 RNA is generally detectable in upper respiratoy specimens during the acute phase of infection. The lowest concentration of SARS-CoV-2 viral copies this assay can detect is 131 copies/mL. A negative result does not preclude SARS-Cov-2 infection and should not be used as the sole basis for treatment or other patient management decisions. A negative result  may occur with  improper specimen collection/handling, submission of specimen other than nasopharyngeal swab, presence of viral mutation(s) within the areas targeted by this assay, and inadequate number of viral copies (<131 copies/mL). A negative result must be combined with  clinical observations, patient history, and epidemiological information. The expected result is Negative.  Fact Sheet for Patients:  PinkCheek.be  Fact Sheet for Healthcare Providers:  GravelBags.it  This test is no                          t yet approved or cleared by the [place] FDA and  has been authorized for detection and/or diagnosis of SARS-CoV-2 by FDA under an Emergency Use Authorization (EUA). This EUA will remain  in effect (meaning this test can be used) for the duration of the COVID-19 declaration under Section 564(b)(1) of the Act, 21 U.S.C. section 360bbb-3(b)(1), unless the authorization is terminated or revoked sooner.    . Influenza A by PCR 01/20/2020 NEGATIVE  NEGATIVE Final  . Influenza B by PCR 01/20/2020 NEGATIVE  NEGATIVE Final   Comment: (NOTE) The Xpert Xpress SARS-CoV-2/FLU/RSV assay is intended as an aid in  the diagnosis of influenza from Nasopharyngeal swab specimens and  should not be used as a sole basis for treatment. Nasal washings and  aspirates are unacceptable for Xpert Xpress SARS-CoV-2/FLU/RSV  testing.  Fact Sheet for Patients: PinkCheek.be  Fact Sheet for Healthcare Providers: GravelBags.it  This test is not yet approved or cleared by the [place] FDA and  has been authorized for detection and/or diagnosis of SARS-CoV-2 by  FDA under an Emergency Use Authorization (EUA). This EUA will remain  in effect (meaning this test can be used) for the duration of the  Covid-19 declaration under Section 564(b)(1) of the Act, 21  U.S.C. section 360bbb-3(b)(1), unless the authorization is  terminated or revoked. Performed at [facility], [address] [place], [place] [address]   Admission on 01/01/2020, Discharged on 01/03/2020  Component Date Value Ref Range Status  . Sodium 01/01/2020 138  135 - 145  mmol/L Final  . Potassium 01/01/2020 4.2  3.5 - 5.1 mmol/L Final  . Chloride 01/01/2020 104  98 - 111 mmol/L Final  . CO2 01/01/2020 24  22 - 32 mmol/L Final  . Glucose, Bld 01/01/2020 126* 70 - 99 mg/dL Final   Glucose reference range applies only to samples taken after fasting for at least 8 hours.  . BUN 01/01/2020 12  6 - 20 mg/dL Final  . Creatinine, Ser 01/01/2020 1.09  0.61 - 1.24 mg/dL Final  . Calcium 01/01/2020 9.0  8.9 - 10.3 mg/dL Final  . Total Protein 01/01/2020 7.2  6.5 - 8.1 g/dL Final  . Albumin 01/01/2020 3.7  3.5 - 5.0 g/dL Final  . AST 01/01/2020 17  15 - 41 U/L Final  . ALT 01/01/2020 11  0 - 44 U/L Final  . Alkaline Phosphatase 01/01/2020 118  38 - 126 U/L Final  . Total Bilirubin 01/01/2020 0.6  0.3 - 1.2 mg/dL Final  . GFR calc non Af Amer 01/01/2020 >60  >60 mL/min Final  . GFR calc Af Amer 01/01/2020 >60  >60 mL/min Final  . Anion gap 01/01/2020 10  5 - 15 Final   Performed at [facility], [address] [place], [place] [address]  . Alcohol, Ethyl (B) 01/01/2020 <10  <10 mg/dL Final   Comment: (NOTE) Lowest detectable limit for serum alcohol is  10 mg/dL.  For medical purposes only. Performed at [facility], [address] [place], [place] [address]   . Salicylate Lvl 98/33/8250 <7.0* 7.0 - 30.0 mg/dL Final   Performed at [facility], [address] [place], [place] [address]  . Acetaminophen (Tylenol), Serum 01/01/2020 <10* 10 - 30 ug/mL Final   Comment: (NOTE) Therapeutic concentrations vary significantly. A range of 10-30 ug/mL  may be an effective concentration for many patients. However, some  are best treated at concentrations outside of this range. Acetaminophen concentrations >150 ug/mL at 4 hours after ingestion  and >50 ug/mL at 12 hours after ingestion are often associated with  toxic reactions.  Performed at [facility], [address], [place], [place] [address]   . WBC 01/01/2020 8.1  4.0 -  10.5 K/uL Final  . RBC 01/01/2020 5.36  4.22 - 5.81 MIL/uL Final  . Hemoglobin 01/01/2020 14.4  13.0 - 17.0 g/dL Final  . HCT 01/01/2020 44.7  39 - 52 % Final  . MCV 01/01/2020 83.4  80.0 - 100.0 fL Final  . MCH 01/01/2020 26.9  26.0 - 34.0 pg Final  . MCHC 01/01/2020 32.2  30.0 - 36.0 g/dL Final  . RDW 01/01/2020 14.9  11.5 - 15.5 % Final  . Platelets 01/01/2020 300  150 - 400 K/uL Final  . nRBC 01/01/2020 0.0  0.0 - 0.2 % Final   Performed at [facility], [address], [place], [place] [address]  . Tricyclic, Ur Screen 40/97/3532 NONE DETECTED  NONE DETECTED Final  . Amphetamines, Ur Screen 01/01/2020 NONE DETECTED  NONE DETECTED Final  . MDMA (Ecstasy)Ur Screen 01/01/2020 NONE DETECTED  NONE DETECTED Final  . Cocaine Metabolite,Ur [place] 01/01/2020 POSITIVE* NONE DETECTED Final  . Opiate, Ur Screen 01/01/2020 NONE DETECTED  NONE DETECTED Final  . Phencyclidine (PCP) Ur S 01/01/2020 NONE DETECTED  NONE DETECTED Final  . Cannabinoid 50 Ng, Ur [place] 01/01/2020 NONE DETECTED  NONE DETECTED Final  . Barbiturates, Ur Screen 01/01/2020 NONE DETECTED  NONE DETECTED Final  . Benzodiazepine, Ur Scrn 01/01/2020 NONE DETECTED  NONE DETECTED Final  . Methadone Scn, Ur 01/01/2020 NONE DETECTED  NONE DETECTED Final   Comment: (NOTE) Tricyclics + metabolites, urine    Cutoff 1000 ng/mL Amphetamines + metabolites, urine  Cutoff 1000 ng/mL MDMA (Ecstasy), urine              Cutoff 500 ng/mL Cocaine Metabolite, urine          Cutoff 300 ng/mL Opiate + metabolites, urine        Cutoff 300 ng/mL Phencyclidine (PCP), urine         Cutoff 25 ng/mL Cannabinoid, urine                 Cutoff 50 ng/mL Barbiturates + metabolites, urine  Cutoff 200 ng/mL Benzodiazepine, urine              Cutoff 200 ng/mL Methadone, urine                   Cutoff 300 ng/mL  The urine drug screen provides only a preliminary, unconfirmed analytical test result and should not be used for non-medical purposes. Clinical  consideration and professional judgment should be applied to any positive drug screen result due to possible interfering substances. A more specific alternate chemical method must be used in order to obtain a confirmed analytical result. Gas chromatography / mass spectrometry (GC/MS) is the preferred confirm  atory method. Performed at [facility] [address] [place], [place] [address]   . SARS Coronavirus 2 01/01/2020 NEGATIVE  NEGATIVE Final   Comment: (NOTE) SARS-CoV-2 target nucleic acids are NOT DETECTED.  The SARS-CoV-2 RNA is generally detectable in upper and lower respiratory specimens during the acute phase of infection. The lowest concentration of SARS-CoV-2 viral copies this assay can detect is 250 copies / mL. A negative result does not preclude SARS-CoV-2 infection and should not be used as the sole basis for treatment or other patient management decisions.  A negative result may occur with improper specimen collection / handling, submission of specimen other than nasopharyngeal swab, presence of viral mutation(s) within the areas targeted by this assay, and inadequate number of viral copies (<250 copies / mL). A negative result must be combined with clinical observations, patient history, and epidemiological information.  Fact Sheet for Patients:   StrictlyIdeas.no  Fact Sheet for Healthcare Providers: BankingDealers.co.za  This test is not yet approved or                           cleared by the [place] FDA and has been authorized for detection and/or diagnosis of SARS-CoV-2 by FDA under an Emergency Use Authorization (EUA).  This EUA will remain in effect (meaning this test can be used) for the duration of the COVID-19 declaration under Section 564(b)(1) of the Act, 21 U.S.C. section 360bbb-3(b)(1), unless the authorization is terminated or revoked sooner.  Performed at  [facility], [address] [place], [place] [address]   Admission on 12/03/2019, Discharged on 12/05/2019  Component Date Value Ref Range Status  . Sodium 12/03/2019 139  135 - 145 mmol/L Final  . Potassium 12/03/2019 3.9  3.5 - 5.1 mmol/L Final  . Chloride 12/03/2019 107  98 - 111 mmol/L Final  . CO2 12/03/2019 20* 22 - 32 mmol/L Final  . Glucose, Bld 12/03/2019 135* 70 - 99 mg/dL Final   Glucose reference range applies only to samples taken after fasting for at least 8 hours.  . BUN 12/03/2019 7  6 - 20 mg/dL Final  . Creatinine, Ser 12/03/2019 1.10  0.61 - 1.24 mg/dL Final  . Calcium 12/03/2019 9.0  8.9 - 10.3 mg/dL Final  . Total Protein 12/03/2019 6.8  6.5 - 8.1 g/dL Final  . Albumin 12/03/2019 3.7  3.5 - 5.0 g/dL Final  . AST 12/03/2019 22  15 - 41 U/L Final  . ALT 12/03/2019 11  0 - 44 U/L Final  . Alkaline Phosphatase 12/03/2019 89  38 - 126 U/L Final  . Total Bilirubin 12/03/2019 0.5  0.3 - 1.2 mg/dL Final  . GFR calc non Af Amer 12/03/2019 >60  >60 mL/min Final  . GFR calc Af Amer 12/03/2019 >60  >60 mL/min Final  . Anion gap 12/03/2019 12  5 - 15 Final   Performed at [facility], [address] [place], [place] [address]  . Alcohol, Ethyl (B) 12/03/2019 <10  <10 mg/dL Final   Comment: (NOTE) Lowest detectable limit for serum alcohol is 10 mg/dL.  For medical purposes only. Performed at [facility], [address], [place], [place] [address]   . Salicylate Lvl 54/65/0354 <7.0* 7.0 - 30.0 mg/dL Final   Performed at [facility], [address] [place], [place] [address]  . Acetaminophen (Tylenol), Serum 12/03/2019 <10* 10 - 30 ug/mL Final   Comment: (NOTE) Therapeutic concentrations vary significantly.  A range of 10-30 ug/mL  may be an effective concentration for many patients. However, some  are best treated at concentrations outside of this range. Acetaminophen concentrations >150 ug/mL at 4 hours after ingestion  and  >50 ug/mL at 12 hours after ingestion are often associated with  toxic reactions.  Performed at [facility], [address], [place], [place] [address]   . WBC 12/03/2019 6.9  4.0 - 10.5 K/uL Final  . RBC 12/03/2019 5.16  4.22 - 5.81 MIL/uL Final  . Hemoglobin 12/03/2019 13.8  13.0 - 17.0 g/dL Final  . HCT 12/03/2019 43.4  39 - 52 % Final  . MCV 12/03/2019 84.1  80.0 - 100.0 fL Final  . MCH 12/03/2019 26.7  26.0 - 34.0 pg Final  . MCHC 12/03/2019 31.8  30.0 - 36.0 g/dL Final  . RDW 12/03/2019 14.6  11.5 - 15.5 % Final  . Platelets 12/03/2019 288  150 - 400 K/uL Final  . nRBC 12/03/2019 0.0  0.0 - 0.2 % Final   Performed at [facility], [address] [place], [place] [address]  . Tricyclic, Ur Screen 94/80/1655 NONE DETECTED  NONE DETECTED Final  . Amphetamines, Ur Screen 12/03/2019 NONE DETECTED  NONE DETECTED Final  . MDMA (Ecstasy)Ur Screen 12/03/2019 NONE DETECTED  NONE DETECTED Final  . Cocaine Metabolite,Ur [place] 12/03/2019 POSITIVE* NONE DETECTED Final  . Opiate, Ur Screen 12/03/2019 NONE DETECTED  NONE DETECTED Final  . Phencyclidine (PCP) Ur S 12/03/2019 NONE DETECTED  NONE DETECTED Final  . Cannabinoid 50 Ng, Ur [place] 12/03/2019 NONE DETECTED  NONE DETECTED Final  . Barbiturates, Ur Screen 12/03/2019 NONE DETECTED  NONE DETECTED Final  . Benzodiazepine, Ur Scrn 12/03/2019 NONE DETECTED  NONE DETECTED Final  . Methadone Scn, Ur 12/03/2019 NONE DETECTED  NONE DETECTED Final   Comment: (NOTE) Tricyclics + metabolites, urine    Cutoff 1000 ng/mL Amphetamines + metabolites, urine  Cutoff 1000 ng/mL MDMA (Ecstasy), urine              Cutoff 500 ng/mL Cocaine Metabolite, urine          Cutoff 300 ng/mL Opiate + metabolites, urine        Cutoff 300 ng/mL Phencyclidine (PCP), urine         Cutoff 25 ng/mL Cannabinoid, urine                 Cutoff 50 ng/mL Barbiturates + metabolites, urine  Cutoff 200 ng/mL Benzodiazepine, urine              Cutoff 200  ng/mL Methadone, urine                   Cutoff 300 ng/mL  The urine drug screen provides only a preliminary, unconfirmed analytical test result and should not be used for non-medical purposes. Clinical consideration and professional judgment should be applied to any positive drug screen result due to possible interfering substances. A more specific alternate chemical method must be used in order to obtain a confirmed analytical result. Gas chromatography / mass spectrometry (GC/MS) is the preferred confirm                          atory method. Performed at [facility], [address], [place], [place] [address]   . SARS Coronavirus 2 12/03/2019 NEGATIVE  NEGATIVE Final   Comment: (NOTE) SARS-CoV-2 target nucleic acids are NOT DETECTED.  The SARS-CoV-2 RNA is generally  detectable in upper and lower respiratory specimens during the acute phase of infection. The lowest concentration of SARS-CoV-2 viral copies this assay can detect is 250 copies / mL. A negative result does not preclude SARS-CoV-2 infection and should not be used as the sole basis for treatment or other patient management decisions.  A negative result may occur with improper specimen collection / handling, submission of specimen other than nasopharyngeal swab, presence of viral mutation(s) within the areas targeted by this assay, and inadequate number of viral copies (<250 copies / mL). A negative result must be combined with clinical observations, patient history, and epidemiological information.  Fact Sheet for Patients:   StrictlyIdeas.no  Fact Sheet for Healthcare Providers: BankingDealers.co.za  This test is not yet approved or                           cleared by the [place] FDA and has been authorized for detection and/or diagnosis of SARS-CoV-2 by FDA under an Emergency Use Authorization (EUA).  This EUA will remain in effect (meaning this  test can be used) for the duration of the COVID-19 declaration under Section 564(b)(1) of the Act, 21 U.S.C. section 360bbb-3(b)(1), unless the authorization is terminated or revoked sooner.  Performed at [facility], [address] [place], [place] [address]   There may be more visits with results that are not included.    Blood Alcohol level:  Lab Results  Component Value Date   ETH <10 03/28/2020   ETH <10 18/84/1660    Metabolic Disorder Labs: Lab Results  Component Value Date   HGBA1C 5.7 (H) 02/16/2020   MPG 117 02/16/2020   No results found for: PROLACTIN Lab Results  Component Value Date   CHOL 171 02/16/2020   TRIG 330 (H) 02/16/2020   HDL 36 (L) 02/16/2020   CHOLHDL 4.8 02/16/2020   VLDL 66 (H) 02/16/2020   LDLCALC 69 02/16/2020    Therapeutic Lab Levels: No results found for: LITHIUM No results found for: VALPROATE No components found for:  CBMZ  Physical Findings   AIMS     Admission (Discharged) from 02/16/2020 in [place] Total Score 0    AUDIT     Admission (Discharged) from 02/16/2020 in [place] Admission (Discharged) from 01/29/2020 in [place] Admission (Discharged) from 01/20/2020 in [place]  Alcohol Use Disorder Identification Test Final Score (AUDIT) 1 0 0       Musculoskeletal  Strength & Muscle Tone: within normal limits Gait & Station: normal Patient leans: N/A  Psychiatric Specialty Exam  Presentation  General Appearance: Appropriate for Environment;Casual  Eye Contact:Good  Speech:Clear and Coherent;Normal Rate  Speech Volume:Normal  Handedness:Right   Mood and Affect  Mood:Depressed  Affect:Congruent;Depressed   Thought Process  Thought Processes:Coherent;Goal Directed  Descriptions of Associations:Intact  Orientation:Full (Time, Place and Person)  Thought  Content:WDL  Hallucinations:Hallucinations: Auditory Description of Auditory Hallucinations: Patient states he can hear people talking to him in his head "I don't know what they are sayin a bunch of stupid shit"  Ideas of Reference:Paranoia  Suicidal Thoughts:Suicidal Thoughts: Yes, Active SI Passive Intent and/or Plan: With Intent;With Plan;With Means to Carry Out;With Access to Means  Homicidal Thoughts:Homicidal Thoughts: Yes, Passive ("Anybody that fucks with me") HI Passive Intent and/or Plan: Without Intent;Without Plan   Sensorium  Memory:Immediate Good;Recent Good;Remote Good  Judgment:Fair  Insight:Fair   [employer]  Concentration:Good  Attention Span:Good  Recall:Good  Fund of Knowledge:Good  Language:Good   Psychomotor Activity  Psychomotor Activity:Psychomotor Activity: Normal   Assets  Assets:Communication Skills;Desire for Improvement;Housing   Sleep  Sleep:Sleep: Good   Physical Exam  Physical Exam Vitals and nursing note reviewed.  Constitutional:      General: He is not in acute distress.    Appearance: Normal appearance. He is not ill-appearing.  HENT:     Head: Normocephalic.  Eyes:     Pupils: Pupils are equal, round, and reactive to light.  Cardiovascular:     Rate and Rhythm: Normal rate.  Pulmonary:     Effort: Pulmonary effort is normal.  Musculoskeletal:        General: Normal range of motion.     Cervical back: Normal range of motion.  Skin:    General: Skin is warm and dry.  Neurological:     Mental Status: He is alert and oriented to person, place, and time.  Psychiatric:        Attention and Perception: Attention normal. He perceives auditory hallucinations.        Mood and Affect: Affect normal. Mood is depressed.        Speech: Speech normal.        Behavior: Behavior normal. Behavior is cooperative.        Thought Content: Thought content is not paranoid or delusional. Thought content includes suicidal  ideation. Thought content does not include homicidal ideation. Thought content includes suicidal plan.        Cognition and Memory: Cognition and memory normal.        Judgment: Judgment is impulsive.    Review of Systems  Constitutional: Negative.   HENT: Negative.   Eyes: Negative.   Respiratory: Negative.   Cardiovascular: Negative.   Gastrointestinal: Negative.   Genitourinary: Negative.   Musculoskeletal: Negative.   Skin: Negative.   Neurological: Negative.   Endo/Heme/Allergies: Negative.   Psychiatric/Behavioral: Positive for depression, hallucinations, substance abuse and suicidal ideas. The patient is nervous/anxious.    Blood pressure 130/75, pulse 65, temperature 97.9 F (36.6 C), temperature source Oral, resp. rate 16, SpO2 100 %. There is no height or weight on file to calculate BMI.  Per [name] patient has a history of multiple presentations to emergency room and prior psychiatric admission.  States that patient has a history of belligerent behavior and self-destructive behavior with attitudes and mood patters consistent with personality disorder.  Reports that efforts have been made to engage patient is substance abuse treatment and referred to inpatient substance abuse treatment with no lasting benefit.  Medications have been tried without lasting compliance or benefit.  Emergency room visits are made with identical complaints of taking an overdose of medications that belonged to someone else; and that there was no evidence that supported that patient had actually tried to seriously harm himself.   On 03/25/2020:  Found that patient history of cocaine abuse, personality disorder, and recurrent ED visit with failure to properly follow up with outpatient treatment is back with identical complaints to previous visit.  Patient labs showed no evidence of overdose of any medication; and that patient's story would change at time to what ever was needed to get his needs met.  When  admitted to psychiatric unit patient did not engage in appropriate therapy and had episodes of inappropriate behavior and resists appropriate plans.  Patient very unlikely to benefit from psychiatric hospitalization; and suicidal ideation appeared to be a chronic issue  and patient's baseline; patient malingering for secondary gain.   Treatment Plan Summary: Daily contact with patient to assess and evaluate symptoms and progress in treatment, Medication management and Plan Inpatietn psychiatric treatment   Medication Management:   Patient restarted on home medications.  Added Seroquel 25 mg Bid for agitation/anxiety.   Will continue to monitor overnight in continuous assessment and discharge tomorrow morning to follow up at [facility].    Hidaya Daniel, NP 03/29/2020 2:24 PM

## 2020-03-29 NOTE — ED Notes (Signed)
Pt currently sleeping in bed. Safety maintained and will continue to monitor.

## 2020-03-29 NOTE — ED Notes (Signed)
Pt given meal

## 2020-03-29 NOTE — ED Notes (Signed)
Pt sleeping at present, no distress noted, monitoring for safety. 

## 2020-03-30 MED ORDER — QUETIAPINE FUMARATE 300 MG PO TABS: 300.0000 mg | ORAL_TABLET | Freq: Every day | ORAL | 0 refills | Status: DC

## 2020-03-30 MED ORDER — CITALOPRAM HYDROBROMIDE 10 MG PO TABS: 10.0000 mg | ORAL_TABLET | Freq: Every day | ORAL | 0 refills | Status: DC

## 2020-03-30 MED ORDER — QUETIAPINE FUMARATE 25 MG PO TABS: 25.0000 mg | ORAL_TABLET | Freq: Two times a day (BID) | ORAL | 0 refills | Status: DC

## 2020-03-30 NOTE — ED Notes (Signed)
Pt sleeping at present, no distress noted, monitoring for safety. 

## 2020-03-30 NOTE — Progress Notes (Signed)
Samuel Molina received his discharge order, his AVS was presented to him along with 3 prescriptions. He refused the AVS and prescriptions. A [employer] car was called and arrived to transport him home in [place]. He received his personal belongings, got dressed and left very angry.

## 2020-03-30 NOTE — Progress Notes (Signed)
Patient awake in bed, with eyes open, respiration even and unlabored. There is no objective signs of discomfort at this time. Patient received update on inpatient facilities continue to review for admission status. Staff will continue to monitor.

## 2020-03-30 NOTE — ED Notes (Signed)
Given  breakfast

## 2020-03-30 NOTE — ED Provider Notes (Signed)
FBC/OBS ASAP Discharge Summary  Date and Time: 03/30/2020 12:32 PM  Name: Samuel Molina  MRN:  [id number]   Discharge Diagnoses:  Final diagnoses:  Suicide ideation  Auditory hallucinations  Substance induced mood disorder ([place])  Cocaine abuse ([place])  Severe recurrent major depression without psychotic features [facility])     Subjective:   Tele Assessment  [name], 59 y.o., male patient seen face to face by this provider; chart reviewed and consulted with Dr. Dwyane Dee on 03/30/20.  On evaluation Samuel Molina reports he is feeling better.  States he is tolerating medication and changes well without adverse reaction.  Patient states he is eating and sleeping without difficulty.  Spoke with nursing and informs there has been no verbal or behavior outburst with patient.   During evaluation [name] is sitting up in chair; he is alert/oriented x 4; calm/cooperative; and mood congruent with affect.  Patient is speaking in a clear tone at moderate volume, and normal pace; with good eye contact.  His thought process is coherent and relevant; There is no indication that he is currently responding to internal/external stimuli or experiencing delusional thought content.  Patient denies suicidal/self-harm/homicidal ideation, psychosis, and paranoia.  Patient has remained calm throughout assessment and has answered questions appropriately.  Patient stating he is also interested rehab services  States he is lives in [place] and will also need referral to outpatient psychiatric provider because he did not like the people he met at [facility] when went in for follow up; but stated prior he did not follow up.    Stay Summary:   Samuel Molina was admitted to [facility] Continuous Assessment unit for Substance induced mood disorder ([place]) and crisis management.  He was treated with home medication adjusting Seroquel adding 50 mg Bid to the 300 mg Q hs.  Patient reported he was tolerating  medications with no adverse reactions.   [name] was discharged with current medication and was instructed on how to take medications as prescribed; (details listed below under Medication List).  [name]'s improvement was monitored by continuous assessment/observation and his report of symptom reduction.  His emotional and mental status was also monitored by staff.           [name] was evaluated for stability and plans for continued recovery upon discharge.  [name] motivation was an integral factor for scheduling further treatment.  The following was addressed as part of his discharge planning and follow up treatment:  Employment, housing, transportation, bed availability, health status, family support, and any pending legal issues were also considered during his during the continuous assessment/observation.  He was offered further treatment options upon discharge including but not limited to Residential, Intensive Outpatient, Outpatient treatment, Rehabilitation services, and resources for shelters and [facility]-way-house if needed.  [name] will follow up with the services as listed below under Follow up Information.    Upon completion of this admission the Samuel Molina was both mentally and medically stable for discharge denying suicidal/homicidal ideation, auditory/visual/tactile hallucinations, delusional thoughts and paranoia.  Patient states he is willing to go to rehab at [facility], [place]) or [facility]     Total Time spent with patient: 45 minutes  Past Psychiatric History: See above Past Medical History:  Past Medical History:  Diagnosis Date  . Anxiety   . Depression   . GERD (gastroesophageal reflux disease)   . Obesity   . Substance abuse [facility])     Past Surgical History:  Procedure Laterality  Date  . ESOPHAGOGASTRODUODENOSCOPY (EGD) WITH PROPOFOL N/A 09/20/2017   Procedure: ESOPHAGOGASTRODUODENOSCOPY (EGD) WITH  PROPOFOL;  Surgeon: Lin Landsman, MD;  Location: [facility] ENDOSCOPY;  Service: Gastroenterology;  Laterality: N/A;  . KNEE SURGERY Right    Family History: History reviewed. No pertinent family history. Family Psychiatric History: Denies Social History:  Social History   Substance and Sexual Activity  Alcohol Use Not Currently     Social History   Substance and Sexual Activity  Drug Use Yes  . Types: Cocaine    Social History   Socioeconomic History  . Marital status: Single    Spouse name: Not on file  . Number of children: Not on file  . Years of education: Not on file  . Highest education level: Not on file  Occupational History  . Not on file  Tobacco Use  . Smoking status: Current Every Day Smoker    Packs/day: 0.50    Types: Cigarettes  . Smokeless tobacco: Never Used  Vaping Use  . Vaping Use: Never used  Substance and Sexual Activity  . Alcohol use: Not Currently  . Drug use: Yes    Types: Cocaine  . Sexual activity: Not on file  Other Topics Concern  . Not on file  Social History Narrative  . Not on file   Social Determinants of Health   Financial Resource Strain:   . Difficulty of Paying Living Expenses: Not on file  Food Insecurity:   . Worried About Charity fundraiser in the Last Year: Not on file  . Ran Out of Food in the Last Year: Not on file  Transportation Needs:   . Lack of Transportation (Medical): Not on file  . Lack of Transportation (Non-Medical): Not on file  Physical Activity:   . Days of Exercise per Week: Not on file  . Minutes of Exercise per Session: Not on file  Stress:   . Feeling of Stress : Not on file  Social Connections:   . Frequency of Communication with Friends and Family: Not on file  . Frequency of Social Gatherings with Friends and Family: Not on file  . Attends Religious Services: Not on file  . Active Member of Clubs or Organizations: Not on file  . Attends Archivist Meetings: Not on file  .  Marital Status: Not on file   SDOH:  SDOH Screenings   Alcohol Screen: Low Risk   . Last Alcohol Screening Score (AUDIT): 1  Depression (PHQ2-9):   . PHQ-2 Score: Not on file  Financial Resource Strain:   . Difficulty of Paying Living Expenses: Not on file  Food Insecurity:   . Worried About Charity fundraiser in the Last Year: Not on file  . Ran Out of Food in the Last Year: Not on file  Housing:   . Last Housing Risk Score: Not on file  Physical Activity:   . Days of Exercise per Week: Not on file  . Minutes of Exercise per Session: Not on file  Social Connections:   . Frequency of Communication with Friends and Family: Not on file  . Frequency of Social Gatherings with Friends and Family: Not on file  . Attends Religious Services: Not on file  . Active Member of Clubs or Organizations: Not on file  . Attends Archivist Meetings: Not on file  . Marital Status: Not on file  Stress:   . Feeling of Stress : Not on file  Tobacco Use: High Risk  .  Smoking Tobacco Use: Current Every Day Smoker  . Smokeless Tobacco Use: Never Used  Transportation Needs:   . Film/video editor (Medical): Not on file  . Lack of Transportation (Non-Medical): Not on file    Has this patient used any form of tobacco in the last 30 days? (Cigarettes, Smokeless Tobacco, Cigars, and/or Pipes) A prescription for an FDA-approved tobacco cessation medication was offered at discharge and the patient refused  Current Medications:  Current Facility-Administered Medications  Medication Dose Route Frequency Provider Last Rate Last Admin  . meloxicam (MOBIC) tablet 15 mg  15 mg Oral Daily Olof Marcil B, NP   15 mg at 03/30/20 1038  . pantoprazole (PROTONIX) EC tablet 80 mg  80 mg Oral Daily Serrina Minogue B, NP   80 mg at 03/30/20 1038  . QUEtiapine (SEROQUEL) tablet 25 mg  25 mg Oral BID Ronin Crager B, NP   25 mg at 03/30/20 1039  . QUEtiapine (SEROQUEL) tablet 300 mg  300 mg Oral QHS  Deklyn Gibbon B, NP   300 mg at 03/29/20 2149   Current Outpatient Medications  Medication Sig Dispense Refill  . citalopram (CELEXA) 10 MG tablet Take 1 tablet (10 mg total) by mouth daily. 30 tablet 1  . meloxicam (MOBIC) 15 MG tablet Take 1 tablet (15 mg total) by mouth daily. (Patient not taking: Reported on 03/29/2020) 7 tablet 0  . methocarbamol (ROBAXIN) 500 MG tablet Take 1 tablet (500 mg total) by mouth every 8 (eight) hours as needed for muscle spasms. 15 tablet 0  . naproxen (NAPROSYN) 500 MG tablet Take 1 tablet (500 mg total) by mouth 2 (two) times daily with a meal. 60 tablet 0  . pantoprazole (PROTONIX) 40 MG tablet Take 2 tablets (80 mg total) by mouth daily. (Patient not taking: Reported on 03/29/2020) 60 tablet 1  . QUEtiapine (SEROQUEL) 300 MG tablet Take 1 tablet (300 mg total) by mouth at bedtime. 30 tablet 1    PTA Medications: (Not in a hospital admission)   Musculoskeletal  Strength & Muscle Tone: within normal limits Gait & Station: normal Patient leans: N/A  Psychiatric Specialty Exam  Presentation  General Appearance: Appropriate for Environment;Casual  Eye Contact:Good  Speech:Clear and Coherent;Normal Rate  Speech Volume:Normal  Handedness:Right   Mood and Affect  Mood:Euthymic  Affect:Appropriate;Congruent   Thought Process  Thought Processes:Coherent;Goal Directed  Descriptions of Associations:Intact  Orientation:Full (Time, Place and Person)  Thought Content:WDL  Hallucinations:Hallucinations: None Description of Auditory Hallucinations: Patient states he can hear people talking to him in his head "I don't know what they are sayin a bunch of stupid shit"  Ideas of Reference:None  Suicidal Thoughts:Suicidal Thoughts: No (Denies at this time) SI Passive Intent and/or Plan: With Intent;With Plan;With Means to Carry Out;With Access to Means  Homicidal Thoughts:Homicidal Thoughts: No HI Passive Intent and/or Plan: Without  Intent;Without Plan   Sensorium  Memory:Immediate Good;Recent Good  Judgment:Intact  Insight:Present   Executive Functions  Concentration:Good  Attention Span:Good  [place] of Knowledge:Good  Language:Good   Psychomotor Activity  Psychomotor Activity:Psychomotor Activity: Normal   Assets  Assets:Communication Skills;Desire for Improvement   Sleep  Sleep:Sleep: Good   Physical Exam  Physical Exam Vitals and nursing note reviewed.  Constitutional:      Appearance: He is well-developed.  HENT:     Head: Normocephalic and atraumatic.  Eyes:     Conjunctiva/sclera: Conjunctivae normal.  Cardiovascular:     Rate and Rhythm: Normal rate and regular rhythm.  Heart sounds: No murmur heard.   Pulmonary:     Effort: Pulmonary effort is normal. No respiratory distress.     Breath sounds: Normal breath sounds.  Abdominal:     Palpations: Abdomen is soft.     Tenderness: There is no abdominal tenderness.  Musculoskeletal:     Cervical back: Neck supple.  Skin:    General: Skin is warm and dry.  Neurological:     Mental Status: He is alert.  Psychiatric:        Mood and Affect: Mood normal.        Behavior: Behavior normal.        Thought Content: Thought content normal.        Judgment: Judgment normal.    Review of Systems  Constitutional: Negative.   HENT: Negative.   Eyes: Negative.   Respiratory: Negative.   Cardiovascular: Negative.   Gastrointestinal: Negative.   Genitourinary: Negative.   Musculoskeletal: Negative.   Skin: Negative.   Neurological: Negative.   Endo/Heme/Allergies: Negative.   Psychiatric/Behavioral: Positive for substance abuse. Negative for hallucinations. Depression: Stable. Suicidal ideas: Denies. Nervous/anxious: Stable. Insomnia: Stable.    Blood pressure 114/82, pulse (!) 58, temperature 98.4 F (36.9 C), temperature source Oral, resp. rate 18, SpO2 100 %. There is no height or weight on file to calculate  BMI.  Demographic Factors:  Male  Loss Factors: NA  Historical Factors: NA  Risk Reduction Factors:   Religious beliefs about death  Continued Clinical Symptoms:  Alcohol/Substance Abuse/Dependencies  Cognitive Features That Contribute To Risk:  None    Suicide Risk:  Minimal: No identifiable suicidal ideation.  Patients presenting with no risk factors but with morbid ruminations; may be classified as minimal risk based on the severity of the depressive symptoms  Plan Of Care/Follow-up recommendations:  Activity:  As tolerated Diet:  Heart healthy   Follow-up Information    Call [place] Why: A referral has been made on your behalf. Please call to determine bed availability on a DAILY basis. Be sure to have any discharge paperwork available once you contact facility.  Contact information: [place] [phone]        Call  [place].   Why: schedule follow up visit after urgernt care hospital discharge.  May do walk in if needed to be seen sooner than appointment. Contact information: [place] [address] [phone]               Disposition: No evidence of imminent risk to self or others at present.   Patient does not meet criteria for psychiatric inpatient admission. Supportive therapy provided about ongoing stressors. Refer to IOP. Discussed crisis plan, support from social network, calling 911, coming to the Emergency Department, and calling Suicide Hotline.  Unity Luepke, NP 03/30/2020, 12:32 PM

## 2020-03-30 NOTE — Progress Notes (Signed)
Patient eyes closed, resting quietly in bed, respirations are even and unlabored at this time, no objective signs of pain or discomfort. Staff will continue to monitor.

## 2020-03-30 NOTE — ED Notes (Signed)
Patient alert and oriented X 4, with active  SI, and AVH. Patient effect is irritable

## 2020-04-01 ENCOUNTER — Emergency Department: Payer: Self-pay

## 2020-04-01 ENCOUNTER — Encounter: Payer: Self-pay | Admitting: Emergency Medicine

## 2020-04-01 ENCOUNTER — Other Ambulatory Visit: Payer: Self-pay

## 2020-04-01 ENCOUNTER — Emergency Department
Admission: EM | Admit: 2020-04-01 | Discharge: 2020-04-01 | Disposition: A | Discharge status: Home / Self Care | Payer: Self-pay | Attending: Emergency Medicine | Admitting: Emergency Medicine

## 2020-04-01 DIAGNOSIS — G510 Bell's palsy: Secondary | ICD-10-CM | POA: Insufficient documentation

## 2020-04-01 DIAGNOSIS — F17210 Nicotine dependence, cigarettes, uncomplicated: Secondary | ICD-10-CM | POA: Insufficient documentation

## 2020-04-01 DIAGNOSIS — R001 Bradycardia, unspecified: Secondary | ICD-10-CM | POA: Insufficient documentation

## 2020-04-01 LAB — DIFFERENTIAL
Abs Immature Granulocytes: 0.01 10*3/uL (ref 0.00–0.07)
Basophils Absolute: 0 10*3/uL (ref 0.0–0.1)
Basophils Relative: 1 %
Eosinophils Absolute: 0.1 10*3/uL (ref 0.0–0.5)
Eosinophils Relative: 3 %
Immature Granulocytes: 0 %
Lymphocytes Relative: 41 %
Lymphs Abs: 2 10*3/uL (ref 0.7–4.0)
Monocytes Absolute: 0.7 10*3/uL (ref 0.1–1.0)
Monocytes Relative: 14 %
Neutro Abs: 2.1 10*3/uL (ref 1.7–7.7)
Neutrophils Relative %: 41 %

## 2020-04-01 LAB — COMPREHENSIVE METABOLIC PANEL
ALT: 13 U/L (ref 0–44)
AST: 18 U/L (ref 15–41)
Albumin: 3.6 g/dL (ref 3.5–5.0)
Alkaline Phosphatase: 76 U/L (ref 38–126)
Anion gap: 7 (ref 5–15)
BUN: 14 mg/dL (ref 6–20)
CO2: 24 mmol/L (ref 22–32)
Calcium: 8.8 mg/dL — ABNORMAL LOW (ref 8.9–10.3)
Chloride: 105 mmol/L (ref 98–111)
Creatinine, Ser: 0.71 mg/dL (ref 0.61–1.24)
GFR, Estimated: >60 mL/min (ref >60)
Glucose, Bld: 104 mg/dL — ABNORMAL HIGH (ref 70–99)
Potassium: 3.9 mmol/L (ref 3.5–5.1)
Sodium: 136 mmol/L (ref 135–145)
Total Bilirubin: 0.5 mg/dL (ref 0.3–1.2)
Total Protein: 7.1 g/dL (ref 6.5–8.1)

## 2020-04-01 LAB — CBC
HCT: 41.5 % (ref 39.0–52.0)
Hemoglobin: 13.2 g/dL (ref 13.0–17.0)
MCH: 26.1 pg (ref 26.0–34.0)
MCHC: 31.8 g/dL (ref 30.0–36.0)
MCV: 82 fL (ref 80.0–100.0)
Platelets: 220 10*3/uL (ref 150–400)
RBC: 5.06 MIL/uL (ref 4.22–5.81)
RDW: 15.4 % (ref 11.5–15.5)
WBC: 5 10*3/uL (ref 4.0–10.5)
nRBC: 0 % (ref 0.0–0.2)

## 2020-04-01 LAB — CBG MONITORING, ED: Glucose-Capillary: 86 mg/dL (ref 70–99)

## 2020-04-01 LAB — PROTIME-INR
INR: 1 (ref 0.8–1.2)
Prothrombin Time: 12.8 seconds (ref 11.4–15.2)

## 2020-04-01 LAB — APTT: aPTT: 28 seconds (ref 24–36)

## 2020-04-01 MED ORDER — ERYTHROMYCIN 5 MG/GM OP OINT: 1.0000 "application " | TOPICAL_OINTMENT | Freq: Every day | OPHTHALMIC | 0 refills | Status: AC

## 2020-04-01 MED ORDER — PREDNISONE 20 MG PO TABS: 60.0000 mg | ORAL_TABLET | Freq: Every day | ORAL | 0 refills | Status: AC

## 2020-04-01 MED ORDER — ACYCLOVIR 400 MG PO TABS: 400.0000 mg | ORAL_TABLET | Freq: Every day | ORAL | 0 refills | Status: AC

## 2020-04-01 NOTE — ED Notes (Signed)
Late entry -- pt noted to frequently walk the hallways asking for something to eat and drink - reporting nurse advises pt has been fed earlier today. No acute distress noted

## 2020-04-01 NOTE — Discharge Instructions (Signed)
Your MRI today showed no evidence of a stroke and the weakness in your face is due to Bell's palsy. This is when the nerves controlling the muscles to your face stop working and is almost always temporary. You will need to take steroids and antiviral medications for one week. You should also keep your left eye moist by using artificial tear eye drops hourly when awake and placing the prescribed ointment in your eye at night. You should schedule follow-up with a neurologist and return to the ER for any worsening symptoms.

## 2020-04-01 NOTE — ED Notes (Signed)
Pt left prior to receiving paperwork

## 2020-04-01 NOTE — ED Provider Notes (Signed)
[facility] Emergency Department Provider Note   ____________________________________________   Event Date/Time   First MD Initiated Contact with Patient 04/01/20 1723     (approximate)  I have reviewed the triage vital signs and the nursing notes.   HISTORY  Chief Complaint Cerebrovascular Accident    HPI Samuel Molina is a 59 y.o. male with past medical history of major depressive disorder, GERD, and cocaine abuse who presents to the ED for strokelike symptoms.  Patient reports that he went to bed around 2 AM last night, subsequently woke up between 10 and 11 AM with drooping over the left side of his face.  He feels like he cannot quite close his eye and saliva is leaking at the left side of his mouth.  He denies any numbness in his face and has not had any vision changes or speech changes.  He states at times he has had some numbness in his left leg as well as his left arm, but he denies any weakness in his extremities.  He admits to regular smoking and last used cocaine 2 days ago.  He denies any history of stroke or similar symptoms.        Past Medical History:  Diagnosis Date  . Anxiety   . Depression   . GERD (gastroesophageal reflux disease)   . Obesity   . Substance abuse [facility])     Patient Active Problem List   Diagnosis Date Noted  . Malingering 03/25/2020  . Personality disorder ([place]) 02/13/2020  . Bursitis 02/13/2020  . Substance induced mood disorder ([place]) 02/12/2020  . Severe recurrent major depression without psychotic features ([place]) 01/21/2020  . Cocaine abuse ([place]) 01/21/2020  . Hip pain 01/21/2020  . Major depression 01/20/2020  . Gastroesophageal reflux disease with esophagitis   . Morbid obesity ([place]) 09/19/2017  . Arthritis of knee 11/03/2015    Past Surgical History:  Procedure Laterality Date  . ESOPHAGOGASTRODUODENOSCOPY (EGD) WITH PROPOFOL N/A 09/20/2017   Procedure: ESOPHAGOGASTRODUODENOSCOPY (EGD) WITH  PROPOFOL;  Surgeon: Lin Landsman, MD;  Location: [place];  Service: Gastroenterology;  Laterality: N/A;  . KNEE SURGERY Right     Prior to Admission medications   Medication Sig Start Date End Date Taking? Authorizing Provider  acyclovir (ZOVIRAX) 400 MG tablet Take 1 tablet (400 mg total) by mouth 5 (five) times daily for 7 days. 04/01/20 04/08/20  Blake Divine, MD  citalopram (CELEXA) 10 MG tablet Take 1 tablet (10 mg total) by mouth daily. 03/30/20   Rankin, Shuvon B, NP  erythromycin ophthalmic ointment Place 1 application into the left eye at bedtime for 4 days. 04/01/20 04/05/20  Blake Divine, MD  meloxicam (MOBIC) 15 MG tablet Take 1 tablet (15 mg total) by mouth daily. Patient not taking: Reported on 03/29/2020 02/25/20   [name], MD  methocarbamol (ROBAXIN) 500 MG tablet Take 1 tablet (500 mg total) by mouth every 8 (eight) hours as needed for muscle spasms. 03/02/20   Vladimir Crofts, MD  pantoprazole (PROTONIX) 40 MG tablet Take 2 tablets (80 mg total) by mouth daily. Patient not taking: Reported on 03/29/2020 02/04/20   Clapacs, [name], MD  predniSONE (DELTASONE) 20 MG tablet Take 3 tablets (60 mg total) by mouth daily for 5 days. 04/01/20 04/06/20  Blake Divine, MD  QUEtiapine (SEROQUEL) 25 MG tablet Take 1 tablet (25 mg total) by mouth 2 (two) times daily. 03/30/20   Rankin, Shuvon B, NP  QUEtiapine (SEROQUEL) 300 MG tablet Take 1  tablet (300 mg total) by mouth at bedtime. 03/30/20   Derrill Center, NP  omeprazole (PRILOSEC OTC) 20 MG tablet Take 2 tablets (40 mg total) by mouth 2 (two) times daily before a meal. 09/19/17 01/29/19  Lin Landsman, MD    Allergies Acetaminophen  History reviewed. No pertinent family history.  Social History Social History   Tobacco Use  . Smoking status: Current Every Day Smoker    Packs/day: 0.50    Types: Cigarettes  . Smokeless tobacco: Never Used  Vaping Use  . Vaping Use: Never used  Substance Use Topics  .  Alcohol use: Not Currently  . Drug use: Yes    Types: Cocaine    Review of Systems  Constitutional: No fever/chills Eyes: No visual changes. ENT: No sore throat. Cardiovascular: Denies chest pain. Respiratory: Denies shortness of breath. Gastrointestinal: No abdominal pain.  No nausea, no vomiting.  No diarrhea.  No constipation. Genitourinary: Negative for dysuria. Musculoskeletal: Negative for back pain. Skin: Negative for rash. Neurological: Negative for headaches, positive for facial droop and left-sided numbness.  ____________________________________________   PHYSICAL EXAM:  VITAL SIGNS: ED Triage Vitals  Enc Vitals Group     BP 04/01/20 1531 (!) 142/76     Pulse Rate 04/01/20 1531 99     Resp 04/01/20 1531 18     Temp 04/01/20 1531 98.7 F (37.1 C)     Temp Source 04/01/20 1531 Oral     SpO2 04/01/20 1531 98 %     Weight --      Height --      Head Circumference --      Peak Flow --      Pain Score 04/01/20 1516 0     Pain Loc --      Pain Edu? --      Excl. in [place]? --     Constitutional: Alert and oriented. Eyes: Conjunctivae are normal.  Pupils equal round and reactive to light, extraocular movements intact. Head: Atraumatic. Nose: No congestion/rhinnorhea. Mouth/Throat: Mucous membranes are moist. Neck: Normal ROM Cardiovascular: Normal rate, regular rhythm. Grossly normal heart sounds. Respiratory: Normal respiratory effort.  No retractions. Lungs CTAB. Gastrointestinal: Soft and nontender. No distention. Genitourinary: deferred Musculoskeletal: No lower extremity tenderness nor edema. Neurologic:  Normal speech and language.  Left-sided facial droop noted involving patient's forehead and eyebrow.  4+ out of 5 strength in left upper extremity with subtle pronator drift, 5 out of 5 strength in left lower extremity as well as right upper and lower extremities. Skin:  Skin is warm, dry and intact. No rash noted. Psychiatric: Mood and affect are normal.  Speech and behavior are normal.  ____________________________________________   LABS (all labs ordered are listed, but only abnormal results are displayed)  Labs Reviewed  COMPREHENSIVE METABOLIC PANEL - Abnormal; Notable for the following components:      Result Value   Glucose, Bld 104 (*)    Calcium 8.8 (*)    All other components within normal limits  PROTIME-INR  APTT  CBC  DIFFERENTIAL  CBG MONITORING, ED   ____________________________________________  EKG  ED ECG REPORT I, Blake Divine, the attending physician, personally viewed and interpreted this ECG.   Date: 04/01/2020  EKG Time: 15:18  Rate: 56  Rhythm: sinus bradycardia  Axis: Normal  Intervals:none  ST&T Change: None   PROCEDURES  Procedure(s) performed (including Critical Care):  Procedures   ____________________________________________   INITIAL IMPRESSION / ASSESSMENT AND PLAN / ED COURSE  59 year old male with past medical history of major depressive disorder, GERD, and cocaine abuse presents to the ED with left-sided facial droop since waking up around 11:00 this morning.  Given his time of last known well was at 2 AM he is not a TPA candidate, he is also van negative with no visual changes, aphasia, or neglect.  CT head was performed and reviewed by me, no obvious hemorrhage and negative for acute process per radiology.  Patient's facial droop involves the upper portion of his left face and therefore appears most consistent with a Bell's palsy.  However, patient also with some questionable weakness in his left upper extremity, although his exam is inconsistent.  We will further assess with MRI for stroke versus Bell's palsy, labs thus far unremarkable.  MRI shows no evidence of stroke, which is consistent with a diagnosis of Bell's palsy.  On reassessment, patient continues to ambulate without difficulty and has full strength in his left upper extremity.  Patient is appropriate for  discharge home with neurology follow-up.  He will be prescribed steroids, antivirals, and erythromycin eye ointment.  He was provided with eye care instructions and counseled to return to the ED for new worsening symptoms.  Patient agrees with plan.      ____________________________________________   FINAL CLINICAL IMPRESSION(S) / ED DIAGNOSES  Final diagnoses:  Bell's palsy     ED Discharge Orders         Ordered    predniSONE (DELTASONE) 20 MG tablet  Daily        04/01/20 1913    erythromycin ophthalmic ointment  Daily at bedtime        04/01/20 1913    acyclovir (ZOVIRAX) 400 MG tablet  5 times daily        04/01/20 1913           Note:  This document was prepared using Dragon voice recognition software and may include unintentional dictation errors.   Blake Divine, MD 04/01/20 [phone]

## 2020-04-01 NOTE — ED Triage Notes (Signed)
Pt to ED via POV with c/o possible stroke. Pt with noted L sided facial droop and L sided weakness and numbness. Pt states last known well was 0200 this morning, pt states awaoke with symptoms with 1100 this morning. Pt A&O x4. Pt with noted L hand grip weakness at this time.

## 2020-04-13 ENCOUNTER — Telehealth (HOSPITAL_COMMUNITY): Payer: Self-pay | Admitting: General Practice

## 2020-04-13 NOTE — Telephone Encounter (Signed)
Care Management - Follow Up [facility] Discharges   Writer attempted to make contact with patient today and was unsuccessful.  Writer was able to leave a HIPPA compliant voice message and will await callback.  Per chart review, patient was given resources to [facility] in [place].

## 2020-04-26 ENCOUNTER — Other Ambulatory Visit: Payer: Self-pay

## 2020-04-26 ENCOUNTER — Encounter: Payer: Self-pay | Admitting: *Deleted

## 2020-04-26 ENCOUNTER — Emergency Department
Admission: EM | Admit: 2020-04-26 | Discharge: 2020-04-26 | Disposition: A | Discharge status: Home / Self Care | Payer: Medicaid Other | Attending: Emergency Medicine | Admitting: Emergency Medicine

## 2020-04-26 ENCOUNTER — Emergency Department
Admit: 2020-04-26 | Discharge: 2020-04-26 | Disposition: A | Discharge status: Home / Self Care | Payer: Medicaid Other | Attending: Emergency Medicine | Admitting: Emergency Medicine

## 2020-04-26 DIAGNOSIS — F1490 Cocaine use, unspecified, uncomplicated: Secondary | ICD-10-CM

## 2020-04-26 DIAGNOSIS — F331 Major depressive disorder, recurrent, moderate: Secondary | ICD-10-CM | POA: Insufficient documentation

## 2020-04-26 DIAGNOSIS — F1410 Cocaine abuse, uncomplicated: Secondary | ICD-10-CM | POA: Diagnosis present

## 2020-04-26 DIAGNOSIS — R45851 Suicidal ideations: Secondary | ICD-10-CM | POA: Insufficient documentation

## 2020-04-26 DIAGNOSIS — X58XXXA Exposure to other specified factors, initial encounter: Secondary | ICD-10-CM | POA: Insufficient documentation

## 2020-04-26 DIAGNOSIS — F1994 Other psychoactive substance use, unspecified with psychoactive substance-induced mood disorder: Secondary | ICD-10-CM | POA: Diagnosis not present

## 2020-04-26 DIAGNOSIS — F609 Personality disorder, unspecified: Secondary | ICD-10-CM | POA: Diagnosis present

## 2020-04-26 DIAGNOSIS — F329 Major depressive disorder, single episode, unspecified: Secondary | ICD-10-CM | POA: Insufficient documentation

## 2020-04-26 DIAGNOSIS — F1910 Other psychoactive substance abuse, uncomplicated: Secondary | ICD-10-CM | POA: Insufficient documentation

## 2020-04-26 DIAGNOSIS — Z7982 Long term (current) use of aspirin: Secondary | ICD-10-CM | POA: Insufficient documentation

## 2020-04-26 DIAGNOSIS — Q670 Congenital facial asymmetry: Secondary | ICD-10-CM | POA: Insufficient documentation

## 2020-04-26 DIAGNOSIS — F17210 Nicotine dependence, cigarettes, uncomplicated: Secondary | ICD-10-CM | POA: Insufficient documentation

## 2020-04-26 DIAGNOSIS — F332 Major depressive disorder, recurrent severe without psychotic features: Secondary | ICD-10-CM | POA: Diagnosis not present

## 2020-04-26 DIAGNOSIS — G510 Bell's palsy: Secondary | ICD-10-CM

## 2020-04-26 DIAGNOSIS — Z765 Malingerer [conscious simulation]: Secondary | ICD-10-CM

## 2020-04-26 DIAGNOSIS — Z20822 Contact with and (suspected) exposure to covid-19: Secondary | ICD-10-CM | POA: Insufficient documentation

## 2020-04-26 DIAGNOSIS — F1914 Other psychoactive substance abuse with psychoactive substance-induced mood disorder: Secondary | ICD-10-CM | POA: Insufficient documentation

## 2020-04-26 HISTORY — DX: Cerebral infarction, unspecified: I63.9

## 2020-04-26 LAB — URINE DRUG SCREEN, QUALITATIVE (ARMC ONLY)
Amphetamines, Ur Screen: NOT DETECTED
Barbiturates, Ur Screen: NOT DETECTED
Benzodiazepine, Ur Scrn: NOT DETECTED
Cannabinoid 50 Ng, Ur ~~LOC~~: NOT DETECTED
Cocaine Metabolite,Ur ~~LOC~~: POSITIVE — AB
MDMA (Ecstasy)Ur Screen: NOT DETECTED
Methadone Scn, Ur: NOT DETECTED
Opiate, Ur Screen: NOT DETECTED
Phencyclidine (PCP) Ur S: NOT DETECTED
Tricyclic, Ur Screen: NOT DETECTED

## 2020-04-26 LAB — MAGNESIUM: Magnesium: 2.2 mg/dL (ref 1.7–2.4)

## 2020-04-26 LAB — SALICYLATE LEVEL: Salicylate Lvl: <7 mg/dL — ABNORMAL LOW (ref 7.0–30.0)

## 2020-04-26 LAB — CBC
HCT: 41.3 % (ref 39.0–52.0)
Hemoglobin: 13.4 g/dL (ref 13.0–17.0)
MCH: 26.1 pg (ref 26.0–34.0)
MCHC: 32.4 g/dL (ref 30.0–36.0)
MCV: 80.5 fL (ref 80.0–100.0)
Platelets: 294 10*3/uL (ref 150–400)
RBC: 5.13 MIL/uL (ref 4.22–5.81)
RDW: 14.9 % (ref 11.5–15.5)
WBC: 11.8 10*3/uL — ABNORMAL HIGH (ref 4.0–10.5)
nRBC: 0 % (ref 0.0–0.2)

## 2020-04-26 LAB — RESP PANEL BY RT-PCR (FLU A&B, COVID) ARPGX2
Influenza A by PCR: NEGATIVE
Influenza B by PCR: NEGATIVE
SARS Coronavirus 2 by RT PCR: NEGATIVE

## 2020-04-26 LAB — COMPREHENSIVE METABOLIC PANEL
ALT: 18 U/L (ref 0–44)
AST: 22 U/L (ref 15–41)
Albumin: 4 g/dL (ref 3.5–5.0)
Alkaline Phosphatase: 84 U/L (ref 38–126)
Anion gap: 10 (ref 5–15)
BUN: 22 mg/dL — ABNORMAL HIGH (ref 6–20)
CO2: 26 mmol/L (ref 22–32)
Calcium: 8.7 mg/dL — ABNORMAL LOW (ref 8.9–10.3)
Chloride: 104 mmol/L (ref 98–111)
Creatinine, Ser: 0.87 mg/dL (ref 0.61–1.24)
GFR, Estimated: >60 mL/min (ref >60)
Glucose, Bld: 121 mg/dL — ABNORMAL HIGH (ref 70–99)
Potassium: 3.2 mmol/L — ABNORMAL LOW (ref 3.5–5.1)
Sodium: 140 mmol/L (ref 135–145)
Total Bilirubin: 0.8 mg/dL (ref 0.3–1.2)
Total Protein: 7.7 g/dL (ref 6.5–8.1)

## 2020-04-26 LAB — ACETAMINOPHEN LEVEL
Acetaminophen (Tylenol), Serum: <10 ug/mL — ABNORMAL LOW (ref 10–30)
Acetaminophen (Tylenol), Serum: <10 ug/mL — ABNORMAL LOW (ref 10–30)

## 2020-04-26 LAB — TROPONIN I (HIGH SENSITIVITY): Troponin I (High Sensitivity): 17 ng/L (ref <18)

## 2020-04-26 LAB — ETHANOL: Alcohol, Ethyl (B): <10 mg/dL (ref <10)

## 2020-04-26 MED ORDER — GABAPENTIN 300 MG PO CAPS
400.0000 mg | ORAL_CAPSULE | Freq: Three times a day (TID) | ORAL | Status: DC
  Administered 2020-04-26: 400 mg via ORAL
  Filled 2020-04-26: qty 1

## 2020-04-26 MED ORDER — CITALOPRAM HYDROBROMIDE 20 MG PO TABS
10.0000 mg | ORAL_TABLET | Freq: Every day | ORAL | Status: DC
  Administered 2020-04-26: 10 mg via ORAL
  Filled 2020-04-26: qty 1

## 2020-04-26 MED ORDER — LISINOPRIL 5 MG PO TABS
5.0000 mg | ORAL_TABLET | Freq: Every day | ORAL | Status: DC
  Administered 2020-04-26: 5 mg via ORAL
  Filled 2020-04-26: qty 1

## 2020-04-26 MED ORDER — PANTOPRAZOLE SODIUM 40 MG PO TBEC
40.0000 mg | DELAYED_RELEASE_TABLET | Freq: Every day | ORAL | Status: DC
  Administered 2020-04-26: 40 mg via ORAL
  Filled 2020-04-26: qty 1

## 2020-04-26 MED ORDER — ATORVASTATIN CALCIUM 20 MG PO TABS
80.0000 mg | ORAL_TABLET | Freq: Every day | ORAL | Status: DC
  Administered 2020-04-26: 80 mg via ORAL
  Filled 2020-04-26: qty 4

## 2020-04-26 MED ORDER — POTASSIUM CHLORIDE CRYS ER 20 MEQ PO TBCR
40.0000 meq | EXTENDED_RELEASE_TABLET | Freq: Once | ORAL | Status: AC
  Administered 2020-04-26: 40 meq via ORAL
  Filled 2020-04-26: qty 2

## 2020-04-26 MED ORDER — SODIUM CHLORIDE 0.9 % IV BOLUS
1000.0000 mL | Freq: Once | INTRAVENOUS | Status: AC
  Administered 2020-04-26: 1000 mL via INTRAVENOUS

## 2020-04-26 MED ORDER — QUETIAPINE FUMARATE 25 MG PO TABS: 100.0000 mg | ORAL_TABLET | Freq: Every day | ORAL | Status: DC

## 2020-04-26 NOTE — Consult Note (Signed)
[facility] Face-to-Face Psychiatry Consult   Reason for Consult: Consult for this 60 year old man who came into the emergency room claiming he had taken an overdose of his medicine Referring Physician:   Shelbie Ammons Patient Identification: Samuel [facility]ina MRN:  [id number] Principal Diagnosis: Substance induced mood disorder ([place]) Diagnosis:  Principal Problem:   Substance induced mood disorder ([place]) Active Problems:   Cocaine abuse ([place])   Personality disorder ([place])   Malingering   Bell's palsy   Total Time spent with patient: 1 hour  Subjective:   Samuel [facility]ina is a 60 y.o. male patient admitted with "do not you know why I am here?".  HPI: Patient seen chart reviewed.  Patient known from previous encounters.  Patient came to the emergency room stating that he had taken an overdose of multiple medicines that he is prescribed.  He cannot name any of the medicines he is currently taking but says that he took a bunch of them after an insult in which his nephew told him that he did not want to be seen with him because of the way the patient's face is currently looking.  He has multiple other complaints that his relatives are mocking him for his left-sided facial droop.  He tells me he feels that he cannot do anything anymore.  Cannot go outside cannot work cannot interact with other people and so he does not "want to live like this".  He claims he took the pills but did it essentially right in front of his sister so that she made him come to the hospital.  As usual patient presents with dramatic claims of depressed mood.  When confronted with his cocaine use will claimed that he only used it "once" and that he had not used it any other times" months".  Patient emphasizes the complaint that he has suffered a stroke.  I pointed out to him that his facial droop was actually diagnosed as a Bell's palsy.  He contradicted me that he later went to [facility] where they diagnosed it as a stroke.  I reviewed the chart and  it does look like they had treated him more aggressively when he went to [facility] claiming left-sided weakness, but that in the end there was never any imaging or study that demonstrated a blockage or an abnormality consistent with a stroke.  Furthermore his left-sided weakness is not very typical at all for a stroke.  During the time we are talking me he clearly uses his left hand and in fact uses it preferentially much of the time.  Gait does not show any weakness on the left side.  Past Psychiatric History: Patient frequents the emergency room quite a bit.  Has a history of frank malingering in the past.  Multiple presentations often 1 after another claiming to have taken overdoses of pills never with any clear evidence that he gets done so.  The most consistent finding is his ongoing use of cocaine.  Patient has been given the benefit of the doubt and prescribed antidepressants but not followed up with outpatient treatment and has been referred to substance abuse treatment programs more than once only to drop out of the programs almost immediately and then blame his failure on others.  Risk to Self:   Risk to Others:   Prior Inpatient Therapy:   Prior Outpatient Therapy:    Past Medical History:  Past Medical History:  Diagnosis Date  . Anxiety   . Depression   . GERD (gastroesophageal reflux disease)   .  Obesity   . Stroke (HCC)   . Substance abuse [facility])     Past Surgical History:  Procedure Laterality Date  . ESOPHAGOGASTRODUODENOSCOPY (EGD) WITH PROPOFOL N/A 09/20/2017   Procedure: ESOPHAGOGASTRODUODENOSCOPY (EGD) WITH PROPOFOL;  Surgeon: Toney Reil, MD;  Location: [facility] ENDOSCOPY;  Service: Gastroenterology;  Laterality: N/A;  . KNEE SURGERY Right    Family History: No family history on file. Family Psychiatric  History: See previous notes.  Nothing specific. Social History:  Social History   Substance and Sexual Activity  Alcohol Use Not Currently     Social History    Substance and Sexual Activity  Drug Use Yes  . Types: Cocaine    Social History   Socioeconomic History  . Marital status: Single    Spouse name: Not on file  . Number of children: Not on file  . Years of education: Not on file  . Highest education level: Not on file  Occupational History  . Not on file  Tobacco Use  . Smoking status: Current Every Day Smoker    Packs/day: 0.50    Types: Cigarettes  . Smokeless tobacco: Never Used  Vaping Use  . Vaping Use: Never used  Substance and Sexual Activity  . Alcohol use: Not Currently  . Drug use: Yes    Types: Cocaine  . Sexual activity: Not on file  Other Topics Concern  . Not on file  Social History Narrative  . Not on file   Social Determinants of Health   Financial Resource Strain: Not on file  Food Insecurity: Not on file  Transportation Needs: Not on file  Physical Activity: Not on file  Stress: Not on file  Social Connections: Not on file   Additional Social History:    Allergies:   Allergies  Allergen Reactions  . Acetaminophen Itching, Nausea And Vomiting and Rash    Labs:  Results for orders placed or performed during the hospital encounter of 04/26/20 (from the past 48 hour(s))  Comprehensive metabolic panel     Status: Abnormal   Collection Time: 04/26/20  1:44 AM  Result Value Ref Range   Sodium 140 135 - 145 mmol/L   Potassium 3.2 (L) 3.5 - 5.1 mmol/L   Chloride 104 98 - 111 mmol/L   CO2 26 22 - 32 mmol/L   Glucose, Bld 121 (H) 70 - 99 mg/dL    Comment: Glucose reference range applies only to samples taken after fasting for at least 8 hours.   BUN 22 (H) 6 - 20 mg/dL   Creatinine, Ser 3.60 0.61 - 1.24 mg/dL   Calcium 8.7 (L) 8.9 - 10.3 mg/dL   Total Protein 7.7 6.5 - 8.1 g/dL   Albumin 4.0 3.5 - 5.0 g/dL   AST 22 15 - 41 U/L   ALT 18 0 - 44 U/L   Alkaline Phosphatase 84 38 - 126 U/L   Total Bilirubin 0.8 0.3 - 1.2 mg/dL   GFR, Estimated >67 >70 mL/min    Comment: (NOTE) Calculated  using the CKD-EPI Creatinine Equation (2021)    Anion gap 10 5 - 15    Comment: Performed at [facility], [address] [place], [place] [address]  Ethanol     Status: None   Collection Time: 04/26/20  1:44 AM  Result Value Ref Range   Alcohol, Ethyl (B) <10 <10 mg/dL    Comment: (NOTE) Lowest detectable limit for serum alcohol is 10 mg/dL.  For medical purposes only. Performed at [employer]  [facility], [address], [place], [place] [address]   Salicylate level     Status: Abnormal   Collection Time: 04/26/20  1:44 AM  Result Value Ref Range   Salicylate Lvl <7.0 (L) 7.0 - 30.0 mg/dL    Comment: Performed at [facility], [address] [place], [place] [address]  Acetaminophen level     Status: Abnormal   Collection Time: 04/26/20  1:44 AM  Result Value Ref Range   Acetaminophen (Tylenol), Serum <10 (L) 10 - 30 ug/mL    Comment: (NOTE) Therapeutic concentrations vary significantly. A range of 10-30 ug/mL  may be an effective concentration for many patients. However, some  are best treated at concentrations outside of this range. Acetaminophen concentrations >150 ug/mL at 4 hours after ingestion  and >50 ug/mL at 12 hours after ingestion are often associated with  toxic reactions.  Performed at [facility], [address] [place], [place] [address]   cbc     Status: Abnormal   Collection Time: 04/26/20  1:44 AM  Result Value Ref Range   WBC 11.8 (H) 4.0 - 10.5 K/uL   RBC 5.13 4.22 - 5.81 MIL/uL   Hemoglobin 13.4 13.0 - 17.0 g/dL   HCT 67.8 93.8 - 10.1 %   MCV 80.5 80.0 - 100.0 fL   MCH 26.1 26.0 - 34.0 pg   MCHC 32.4 30.0 - 36.0 g/dL   RDW 75.1 02.5 - 85.2 %   Platelets 294 150 - 400 K/uL   nRBC 0.0 0.0 - 0.2 %    Comment: Performed at [facility], [address], [place], [place] [address]  Troponin I (High Sensitivity)     Status: None   Collection Time: 04/26/20  1:44 AM  Result Value Ref Range   Troponin I  (High Sensitivity) 17 <18 ng/L    Comment: (NOTE) Elevated high sensitivity troponin I (hsTnI) values and significant  changes across serial measurements may suggest ACS but many other  chronic and acute conditions are known to elevate hsTnI results.  Refer to the "Links" section for chest pain algorithms and additional  guidance. Performed at [facility], [address] [place], [place] [address]   Magnesium     Status: None   Collection Time: 04/26/20  1:44 AM  Result Value Ref Range   Magnesium 2.2 1.7 - 2.4 mg/dL    Comment: Performed at [facility], [address] [place], [place] [address]  Urine Drug Screen, Qualitative     Status: Abnormal   Collection Time: 04/26/20  4:25 AM  Result Value Ref Range   Tricyclic, Ur Screen NONE DETECTED NONE DETECTED   Amphetamines, Ur Screen NONE DETECTED NONE DETECTED   MDMA (Ecstasy)Ur Screen NONE DETECTED NONE DETECTED   Cocaine Metabolite,Ur [place] POSITIVE (A) NONE DETECTED   Opiate, Ur Screen NONE DETECTED NONE DETECTED   Phencyclidine (PCP) Ur S NONE DETECTED NONE DETECTED   Cannabinoid 50 Ng, Ur [place] NONE DETECTED NONE DETECTED   Barbiturates, Ur Screen NONE DETECTED NONE DETECTED   Benzodiazepine, Ur Scrn NONE DETECTED NONE DETECTED   Methadone Scn, Ur NONE DETECTED NONE DETECTED    Comment: (NOTE) Tricyclics + metabolites, urine    Cutoff 1000 ng/mL Amphetamines + metabolites, urine  Cutoff 1000 ng/mL MDMA (Ecstasy), urine              Cutoff 500 ng/mL Cocaine Metabolite, urine          Cutoff 300 ng/mL Opiate +  metabolites, urine        Cutoff 300 ng/mL Phencyclidine (PCP), urine         Cutoff 25 ng/mL Cannabinoid, urine                 Cutoff 50 ng/mL Barbiturates + metabolites, urine  Cutoff 200 ng/mL Benzodiazepine, urine              Cutoff 200 ng/mL Methadone, urine                   Cutoff 300 ng/mL  The urine drug screen provides only a preliminary, unconfirmed analytical test result and should not  be used for non-medical purposes. Clinical consideration and professional judgment should be applied to any positive drug screen result due to possible interfering substances. A more specific alternate chemical method must be used in order to obtain a confirmed analytical result. Gas chromatography / mass spectrometry (GC/MS) is the preferred confirm atory method. Performed at [facility], [address] [place], [place] [address]   Resp Panel by RT-PCR (Flu A&B, Covid) Nasopharyngeal Swab     Status: None   Collection Time: 04/26/20  4:26 AM   Specimen: Nasopharyngeal Swab; Nasopharyngeal(NP) swabs in vial transport medium  Result Value Ref Range   SARS Coronavirus 2 by RT PCR NEGATIVE NEGATIVE    Comment: (NOTE) SARS-CoV-2 target nucleic acids are NOT DETECTED.  The SARS-CoV-2 RNA is generally detectable in upper respiratory specimens during the acute phase of infection. The lowest concentration of SARS-CoV-2 viral copies this assay can detect is 138 copies/mL. A negative result does not preclude SARS-Cov-2 infection and should not be used as the sole basis for treatment or other patient management decisions. A negative result may occur with  improper specimen collection/handling, submission of specimen other than nasopharyngeal swab, presence of viral mutation(s) within the areas targeted by this assay, and inadequate number of viral copies(<138 copies/mL). A negative result must be combined with clinical observations, patient history, and epidemiological information. The expected result is Negative.  Fact Sheet for Patients:  EntrepreneurPulse.com.au  Fact Sheet for Healthcare Providers:  IncredibleEmployment.be  This test is no t yet approved or cleared by the [place] FDA and  has been authorized for detection and/or diagnosis of SARS-CoV-2 by FDA under an Emergency Use Authorization (EUA). This EUA will remain  in  effect (meaning this test can be used) for the duration of the COVID-19 declaration under Section 564(b)(1) of the Act, 21 U.S.C.section 360bbb-3(b)(1), unless the authorization is terminated  or revoked sooner.       Influenza A by PCR NEGATIVE NEGATIVE   Influenza B by PCR NEGATIVE NEGATIVE    Comment: (NOTE) The Xpert Xpress SARS-CoV-2/FLU/RSV plus assay is intended as an aid in the diagnosis of influenza from Nasopharyngeal swab specimens and should not be used as a sole basis for treatment. Nasal washings and aspirates are unacceptable for Xpert Xpress SARS-CoV-2/FLU/RSV testing.  Fact Sheet for Patients: EntrepreneurPulse.com.au  Fact Sheet for Healthcare Providers: IncredibleEmployment.be  This test is not yet approved or cleared by the [place] FDA and has been authorized for detection and/or diagnosis of SARS-CoV-2 by FDA under an Emergency Use Authorization (EUA). This EUA will remain in effect (meaning this test can be used) for the duration of the COVID-19 declaration under Section 564(b)(1) of the Act, 21 U.S.C. section 360bbb-3(b)(1), unless the authorization is terminated or revoked.  Performed at [facility], [address] [place], [place] [address]  Acetaminophen level     Status: Abnormal   Collection Time: 04/26/20  7:46 AM  Result Value Ref Range   Acetaminophen (Tylenol), Serum <10 (L) 10 - 30 ug/mL    Comment: (NOTE) Therapeutic concentrations vary significantly. A range of 10-30 ug/mL  may be an effective concentration for many patients. However, some  are best treated at concentrations outside of this range. Acetaminophen concentrations >150 ug/mL at 4 hours after ingestion  and >50 ug/mL at 12 hours after ingestion are often associated with  toxic reactions.  Performed at [facility], [address], [place], [place] [address]     Current Facility-Administered Medications   Medication Dose Route Frequency Provider Last Rate Last Admin  . atorvastatin (LIPITOR) tablet 80 mg  80 mg Oral Daily Taronda Comacho T, MD      . citalopram (CELEXA) tablet 10 mg  10 mg Oral Daily Roizy Harold T, MD      . gabapentin (NEURONTIN) capsule 400 mg  400 mg Oral TID Andjela Wickes T, MD      . lisinopril (ZESTRIL) tablet 5 mg  5 mg Oral Daily Rowdy Guerrini T, MD      . pantoprazole (PROTONIX) EC tablet 40 mg  40 mg Oral Daily Elania Crowl T, MD      . QUEtiapine (SEROQUEL) tablet 100 mg  100 mg Oral QHS Daley Gosse, [name], MD       Current Outpatient Medications  Medication Sig Dispense Refill  . aspirin EC 81 MG tablet Take 81 mg by mouth daily. Swallow whole.    Marland Kitchen atorvastatin (LIPITOR) 80 MG tablet Take 80 mg by mouth every evening.    . citalopram (CELEXA) 10 MG tablet Take 1 tablet (10 mg total) by mouth daily. 30 tablet 0  . gabapentin (NEURONTIN) 400 MG capsule Take 400 mg by mouth 3 (three) times daily.    Marland Kitchen lisinopril (ZESTRIL) 5 MG tablet Take 5 mg by mouth daily.    . pantoprazole (PROTONIX) 40 MG tablet Take 2 tablets (80 mg total) by mouth daily. (Patient taking differently: Take 40 mg by mouth daily.) 60 tablet 1  . tiZANidine (ZANAFLEX) 2 MG tablet Take 2 mg by mouth every 8 (eight) hours as needed for muscle spasms.    Marland Kitchen QUEtiapine (SEROQUEL) 25 MG tablet Take 1 tablet (25 mg total) by mouth 2 (two) times daily. (Patient not taking: Reported on 04/26/2020) 60 tablet 0  . QUEtiapine (SEROQUEL) 300 MG tablet Take 1 tablet (300 mg total) by mouth at bedtime. (Patient not taking: Reported on 04/26/2020) 30 tablet 0    Musculoskeletal: Strength & Muscle Tone: within normal limits Gait & Station: normal Patient leans: N/A  Psychiatric Specialty Exam: Physical Exam Vitals and nursing note reviewed.  Constitutional:      Appearance: He is well-developed and well-nourished.  HENT:     Head: Normocephalic and atraumatic.  Eyes:     Conjunctiva/sclera: Conjunctivae  normal.     Pupils: Pupils are equal, round, and reactive to light.  Cardiovascular:     Heart sounds: Normal heart sounds.  Pulmonary:     Effort: Pulmonary effort is normal.  Abdominal:     Palpations: Abdomen is soft.  Musculoskeletal:        General: Normal range of motion.     Cervical back: Normal range of motion.  Skin:    General: Skin is warm and dry.  Neurological:     Mental Status: He is alert.  Comments: Left facial nerve does appear weak and pattern most typical of Bell's palsy  Psychiatric:        Attention and Perception: Attention normal.        Mood and Affect: Mood is depressed.        Speech: Speech normal.        Behavior: Behavior is cooperative.        Thought Content: Thought content is paranoid. Thought content does not include homicidal or suicidal ideation.        Cognition and Memory: Memory is impaired.        Judgment: Judgment is impulsive.     Review of Systems  Constitutional: Negative.   HENT: Negative.   Eyes: Negative.   Respiratory: Negative.   Cardiovascular: Negative.   Gastrointestinal: Negative.   Musculoskeletal: Negative.   Skin: Negative.   Neurological: Negative.   Psychiatric/Behavioral: Positive for dysphoric mood and suicidal ideas.    Blood pressure 122/68, pulse (!) 59, temperature (!) 97.5 F (36.4 C), temperature source Oral, resp. rate 18, height 6\' 1"  (1.854 m), weight 98 kg, SpO2 99 %.Body mass index is 28.5 kg/m.  General Appearance: Casual  Eye Contact:  Fair  Speech:  Clear and Coherent  Volume:  Normal  Mood:  Dysphoric  Affect:  Depressed  Thought Process:  Coherent  Orientation:  Full (Time, Place, and Person)  Thought Content:  Tangential  Suicidal Thoughts:  Yes.  without intent/plan  Homicidal Thoughts:  No  Memory:  Immediate;   Fair Recent;   Poor Remote;   Poor  Judgement:  Impaired  Insight:  Shallow  Psychomotor Activity:  Normal  Concentration:  Concentration: Fair  Recall:  [employer]  of Knowledge:  Fair  Language:  Fair  Akathisia:  No  Handed:  Right  AIMS (if indicated):     Assets:  Desire for Improvement Resilience  ADL's:  Impaired  Cognition:  Impaired,  Mild  Sleep:        Treatment Plan Summary: Medication management and Plan His Bell's palsy, which does seem to be a genuine finding, seems to have been quite a emotional blow to him.  He is fixated on the idea that he had a catastrophic stroke from which he will never recover despite all evidence to the contrary.  Patient presents as histrionic entitled demanding as usual.  Very unlikely to benefit from inpatient treatment.  Restarted outpatient medicines such as I could document them.  Probably will be released from IVC and encouraged to finally follow up with outpatient treatment.  Disposition: Patient does not meet criteria for psychiatric inpatient admission.  Alethia Berthold, MD 04/26/2020 4:42 PM

## 2020-04-26 NOTE — ED Notes (Signed)
Pt angry he is being discharged. After getting dressed the patient came to nurses station and yelled, "Give me my damn bag."   Pt escorted off unit by 2 securtiy officers.

## 2020-04-26 NOTE — ED Notes (Signed)
Pt ambulatory to toilet without difficulty, returned to bed, sample collected  Pt interacting pleasantly with staff, pt given additional fluids and 2 x icee, lights dimmed pt reports ready to sleep, cardiac monitor on

## 2020-04-26 NOTE — ED Notes (Signed)
TTS still in with pt

## 2020-04-26 NOTE — ED Triage Notes (Signed)
Black tennis shoes, jacket, striped button down shirt, jeans, black socks, blue bookbag with medication, wallet placed in 2 labeled pt belonging bags.

## 2020-04-26 NOTE — ED Triage Notes (Addendum)
BIB [facility] from home due to suicidal attempt tonight; took multiple of his prescription medications. Pt reports recent stroke; left with residual facial droop and reports being made fun of for the way his face looks. Reports that he also wants to "kill my cousins too" they are the ones that are making fun of him.   States he took 5-6 pills of each gabepentin 400mg , atorvastatin 80mg , pantoprazole 40mg , lisinopril 5mg , citalopram 20mg , tizanidine 2mg  approx 30 minutes ago,. Charge RN aware.   Dressed out by this with [employer] at bedside.

## 2020-04-26 NOTE — BH Assessment (Signed)
Comprehensive Clinical Assessment (CCA) Note  04/26/2020 Samuel Molina [id number]   Samuel Molina is a 60 year old patient who was brought to [facility] ED via EMS due to pill ingestion. 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 Reports that he also wants to "kill my cousins too" they are the ones that are making fun of him. Pt tested for cocaine and has an extensive history of cocaine abuse. Pt states, "After I fought with my cousin and kicked them out, I went into my room and began thinking about their negative comments and looked at my deceased wife's pitcher and that's when I became overwhelmed. Pt admitted to using cocaine prior to the incident. He expressed that he does not have a SA problem and that this was his first time using in three months. Pt denies current SI, HI, AV/H, NSSIB, access to guns/weapons, or engagement with the legal system. Pt admits to having unprocessed grief emotions concerning the loss of his wife. Pt states that he suffers from depression and is very much interested in treatment. Pt is in denial about his cocaine abuse and minimized. Pt's protective factors include a supportive sister, whom of which he resides. Pt is oriented x4. His recent and remote memory is intact. Pt was expansive and actively engaged in the assessment.     Recommendations for Services/Supports/Treatments: Awaiting Psych consult/disposition    Chief Complaint:  Chief Complaint  Patient presents with  . Suicidal  . Suicide Attempt  . Depression   Visit Diagnosis: Substance-induced mood disorder  Depression  CCA Screening, Triage and Referral (STR)  Patient Reported Information How did you hear about [place]? Self  Referral name: Self  Referral phone number: [phone]   Whom do you see for routine medical problems? I don't have a doctor; Other (Comment) (Pt reports having a neurologist  (Dr. Sheralyn Molina))  Practice/Facility Name: No data recorded Practice/Facility Phone Number: No data recorded Name of Contact: No data recorded Contact Number: No data recorded Contact Fax Number: No data recorded Prescriber Name: No data recorded Prescriber Address (if known): No data recorded  What Is the Reason for Your Visit/Call Today? SI; pill ingestion  How Long Has This Been Causing You Problems? <Week  What Do You Feel Would Help You the Most Today? Medication; Therapy   Have You Recently Been in Any Inpatient Treatment (Hospital/Detox/Crisis Center/28-Day Program)? Yes  Name/Location of Program/Hospital:[facility]  How Long Were You There? 2 Days  When Were You Discharged? 02/16/2020   Have You Ever Received Services From [employer] Before? No  Who Do You See at [facility]? No data recorded  Have You Recently Had Any Thoughts About Hurting Yourself? Yes (Pt presents to ED due to SI and ingesting various medications)  Are You Planning to Commit Suicide/Harm Yourself At This time? No   Have you Recently Had Thoughts About [place]? Yes (Pt reported having thoughts of wanting to harm his cousins.)  Explanation: No data recorded  Have You Used Any Alcohol or Drugs in the Past 24 Hours? Yes  How Long Ago Did You Use Drugs or Alcohol? 2000  What Did You Use and How Much? Cocaine; unspecified amount   Do You Currently Have a Therapist/Psychiatrist? No  Name of Therapist/Psychiatrist: No data recorded  Have You Been Recently Discharged From Any Office Practice or Programs? No  Explanation of Discharge From Practice/Program: No data  recorded    CCA Screening Triage Referral Assessment Type of Contact: Face-to-Face  Is this Initial or Reassessment? No data recorded Date Telepsych consult ordered in [facility]:  02/12/2020  Time Telepsych consult ordered in [facility]:  1827   Patient Reported Information Reviewed? Yes  Patient Left Without Being Seen?  No data recorded Reason for Not Completing Assessment: No data recorded  Collateral Involvement: None   Does Patient Have a Court Appointed Legal Guardian? No data recorded Name and Contact of Legal Guardian: Self  If Minor and Not Living with Parent(s), Who has Custody? n/a  Is CPS involved or ever been involved? Never  Is APS involved or ever been involved? Never   Patient Determined To Be At Risk for Harm To Self or Others Based on Review of Patient Reported Information or Presenting Complaint? Yes, for Self-Harm  Method: No data recorded Availability of Means: No data recorded Intent: No data recorded Notification Required: No data recorded Additional Information for Danger to Others Potential: No data recorded Additional Comments for Danger to Others Potential: No data recorded Are There Guns or Other Weapons in Your Home? No data recorded Types of Guns/Weapons: No data recorded Are These Weapons Safely Secured?                            No data recorded Who Could Verify You Are Able To Have These Secured: No data recorded Do You Have any Outstanding Charges, Pending Court Dates, Parole/Probation? No data recorded Contacted To Inform of Risk of Harm To Self or Others: No data recorded  Location of Assessment: [facility] ED   Does Patient Present under Involuntary Commitment? Yes  IVC Papers Initial File Date: 04/26/2020   [place] of Residence: [place]   Patient Currently Receiving the Following Services: Not Receiving Services   Determination of Need: Urgent (48 hours)   Options For Referral: Inpatient Hospitalization     CCA Biopsychosocial Intake/Chief Complaint:  No data recorded Current Symptoms/Problems: No data recorded  Patient Reported Schizophrenia/Schizoaffective Diagnosis in Past: No data recorded  Strengths: No data recorded Preferences: No data recorded Abilities: No data recorded  Type of Services Patient Feels are Needed: No data  recorded  Initial Clinical Notes/Concerns: No data recorded  Mental Health Symptoms Depression:  No data recorded  Duration of Depressive symptoms: No data recorded  Mania:  No data recorded  Anxiety:   No data recorded  Psychosis:  No data recorded  Duration of Psychotic symptoms: No data recorded  Trauma:  No data recorded  Obsessions:  No data recorded  Compulsions:  No data recorded  Inattention:  No data recorded  Hyperactivity/Impulsivity:  No data recorded  Oppositional/Defiant Behaviors:  No data recorded  Emotional Irregularity:  No data recorded  Other Mood/Personality Symptoms:  No data recorded   Mental Status Exam Appearance and self-care  Stature:  No data recorded  Weight:  No data recorded  Clothing:  No data recorded  Grooming:  No data recorded  Cosmetic use:  No data recorded  Posture/gait:  No data recorded  Motor activity:  No data recorded  Sensorium  Attention:  No data recorded  Concentration:  No data recorded  Orientation:  No data recorded  Recall/memory:  No data recorded  Affect and Mood  Affect:  No data recorded  Mood:  No data recorded  Relating  Eye contact:  No data recorded  Facial expression:  No data recorded  Attitude toward  examiner:  No data recorded  Thought and Language  Speech flow: No data recorded  Thought content:  No data recorded  Preoccupation:  No data recorded  Hallucinations:  No data recorded  Organization:  No data recorded  [employer] of Knowledge:  No data recorded  Intelligence:  No data recorded  Abstraction:  No data recorded  Judgement:  No data recorded  Reality Testing:  No data recorded  Insight:  No data recorded  Decision Making:  No data recorded  Social Functioning  Social Maturity:  No data recorded  Social Judgement:  No data recorded  Stress  Stressors:  No data recorded  Coping Ability:  No data recorded  Skill Deficits:  No data recorded  Supports:  No data recorded     Religion:    Leisure/Recreation:    Exercise/Diet:     CCA Employment/Education Employment/Work Situation:    Education:     CCA Family/Childhood History Family and Relationship History:    Childhood History:     Child/Adolescent Assessment:     CCA Substance Use Alcohol/Drug Use:                           ASAM's:  Six Dimensions of Multidimensional Assessment  Dimension 1:  Acute Intoxication and/or Withdrawal Potential:      Dimension 2:  Biomedical Conditions and Complications:      Dimension 3:  Emotional, Behavioral, or Cognitive Conditions and Complications:     Dimension 4:  Readiness to Change:     Dimension 5:  Relapse, Continued use, or Continued Problem Potential:     Dimension 6:  Recovery/Living Environment:     ASAM Severity Score:    ASAM Recommended Level of Treatment:     Substance use Disorder (SUD)    Recommendations for Services/Supports/Treatments:    DSM5 Diagnoses: Patient Active Problem List   Diagnosis Date Noted  . Malingering 03/25/2020  . Personality disorder (HCC) 02/13/2020  . Bursitis 02/13/2020  . Substance induced mood disorder (HCC) 02/12/2020  . 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 Cocaine abuse (HCC) 01/21/2020  . Hip pain 01/21/2020  . Major depression 01/20/2020  . Gastroesophageal reflux disease with esophagitis   . Morbid obesity (HCC) 09/19/2017  . Arthritis of knee 11/03/2015    Patient Centered Plan: Patient is on the following Treatment Plan(s):  Depression   Referrals to Alternative Service(s): Referred to Alternative Service(s):   Place:   Date:   Time:    Referred to Alternative Service(s):   Place:   Date:   Time:    Referred to Alternative Service(s):   Place:   Date:   Time:    Referred to Alternative Service(s):   Place:   Date:   Time:     Foy [place], LCASComprehensive Clinical Assessment (CCA) Note  04/26/2020 Samuel Molina [id number]  Chief Complaint:  Chief Complaint  Patient presents with  . Suicidal  . Suicide Attempt  . Depression   Visit Diagnosis: Substance-induced mood disorder    CCA Screening, Triage and Referral (STR)  Patient Reported Information How did you hear about [place]? No data recorded Referral name: No data recorded Referral phone number: No data recorded  Whom do you see for routine medical problems? No data recorded Practice/Facility Name: No data recorded Practice/Facility Phone Number: No data recorded Name of Contact: No data recorded Contact Number: No data recorded Contact Fax  Number: No data recorded Prescriber Name: No data recorded Prescriber Address (if known): No data recorded  What Is the Reason for Your Visit/Call Today? No data recorded How Long Has This Been Causing You Problems? No data recorded What Do You Feel Would Help You the Most Today? No data recorded  Have You Recently Been in Any Inpatient Treatment (Hospital/Detox/Crisis Center/28-Day Program)? No data recorded Name/Location of Program/Hospital:No data recorded How Long Were You There? No data recorded When Were You Discharged? No data recorded  Have You Ever Received Services From [facility] Before? No data recorded Who Do You See at [facility]? No data recorded  Have You Recently Had Any Thoughts About Hurting Yourself? No data recorded Are You Planning to Commit Suicide/Harm Yourself At This time? No data recorded  Have you Recently Had Thoughts About [place]? No data recorded Explanation: No data recorded  Have You Used Any Alcohol or Drugs in the Past 24 Hours? No data recorded How Long Ago Did You Use Drugs or Alcohol? No data recorded What Did You Use and How Much? No data recorded  Do You Currently Have a Therapist/Psychiatrist? No data recorded Name of Therapist/Psychiatrist: No data recorded  Have You Been Recently Discharged From Any Office Practice or  Programs? No data recorded Explanation of Discharge From Practice/Program: No data recorded    CCA Screening Triage Referral Assessment Type of Contact: No data recorded Is this Initial or Reassessment? No data recorded Date Telepsych consult ordered in [facility]:  02/12/2020  Time Telepsych consult ordered in [facility]:  1827   Patient Reported Information Reviewed? No data recorded Patient Left Without Being Seen? No data recorded Reason for Not Completing Assessment: No data recorded  Collateral Involvement: No data recorded  Does Patient Have a [place]? No data recorded Name and Contact of Legal Guardian: Self  If Minor and Not Living with Parent(s), Who has Custody? NA  Is CPS involved or ever been involved? Never  Is APS involved or ever been involved? Never   Patient Determined To Be At Risk for Harm To Self or Others Based on Review of Patient Reported Information or Presenting Complaint? No data recorded Method: No data recorded Availability of Means: No data recorded Intent: No data recorded Notification Required: No data recorded Additional Information for Danger to Others Potential: No data recorded Additional Comments for Danger to Others Potential: No data recorded Are There Guns or Other Weapons in Your Home? No data recorded Types of Guns/Weapons: No data recorded Are These Weapons Safely Secured?                            No data recorded Who Could Verify You Are Able To Have These Secured: No data recorded Do You Have any Outstanding Charges, Pending Court Dates, Parole/Probation? No data recorded Contacted To Inform of Risk of Harm To Self or Others: No data recorded  Location of Assessment: -- ([facility])   Does Patient Present under Involuntary Commitment? No data recorded IVC Papers Initial File Date: No data recorded  [place] of Residence: No data recorded  Patient Currently Receiving the Following Services: No data  recorded  Determination of Need: No data recorded  Options For Referral: No data recorded    CCA Biopsychosocial Intake/Chief Complaint:  No data recorded Current Symptoms/Problems: No data recorded  Patient Reported Schizophrenia/Schizoaffective Diagnosis in Past: No data recorded  Strengths: No data recorded Preferences: No  data recorded Abilities: No data recorded  Type of Services Patient Feels are Needed: No data recorded  Initial Clinical Notes/Concerns: No data recorded  Mental Health Symptoms Depression:  No data recorded  Duration of Depressive symptoms: No data recorded  Mania:  No data recorded  Anxiety:   No data recorded  Psychosis:  No data recorded  Duration of Psychotic symptoms: No data recorded  Trauma:  No data recorded  Obsessions:  No data recorded  Compulsions:  No data recorded  Inattention:  No data recorded  Hyperactivity/Impulsivity:  No data recorded  Oppositional/Defiant Behaviors:  No data recorded  Emotional Irregularity:  No data recorded  Other Mood/Personality Symptoms:  No data recorded   Mental Status Exam Appearance and self-care  Stature:  No data recorded  Weight:  No data recorded  Clothing:  No data recorded  Grooming:  No data recorded  Cosmetic use:  No data recorded  Posture/gait:  No data recorded  Motor activity:  No data recorded  Sensorium  Attention:  No data recorded  Concentration:  No data recorded  Orientation:  No data recorded  Recall/memory:  No data recorded  Affect and Mood  Affect:  No data recorded  Mood:  No data recorded  Relating  Eye contact:  No data recorded  Facial expression:  No data recorded  Attitude toward examiner:  No data recorded  Thought and Language  Speech flow: No data recorded  Thought content:  No data recorded  Preoccupation:  No data recorded  Hallucinations:  No data recorded  Organization:  No data recorded  [employer] of Knowledge:  No data recorded   Intelligence:  No data recorded  Abstraction:  No data recorded  Judgement:  No data recorded  Reality Testing:  No data recorded  Insight:  No data recorded  Decision Making:  No data recorded  Social Functioning  Social Maturity:  No data recorded  Social Judgement:  No data recorded  Stress  Stressors:  No data recorded  Coping Ability:  No data recorded  Skill Deficits:  No data recorded  Supports:  No data recorded    Religion:    Leisure/Recreation:    Exercise/Diet:     CCA Employment/Education Employment/Work Situation:    Education:     CCA Family/Childhood History Family and Relationship History:    Childhood History:     Child/Adolescent Assessment:     CCA Substance Use Alcohol/Drug Use:                           ASAM's:  Six Dimensions of Multidimensional Assessment  Dimension 1:  Acute Intoxication and/or Withdrawal Potential:      Dimension 2:  Biomedical Conditions and Complications:      Dimension 3:  Emotional, Behavioral, or Cognitive Conditions and Complications:     Dimension 4:  Readiness to Change:     Dimension 5:  Relapse, Continued use, or Continued Problem Potential:     Dimension 6:  Recovery/Living Environment:     ASAM Severity Score:    ASAM Recommended Level of Treatment:     Substance use Disorder (SUD)    Recommendations for Services/Supports/Treatments:    DSM5 Diagnoses: Patient Active Problem List   Diagnosis Date Noted  . Malingering 03/25/2020  . Personality disorder ([place]) 02/13/2020  . Bursitis 02/13/2020  . Substance induced mood disorder ([place]) 02/12/2020  . Severe recurrent major depression without psychotic features ([place])  01/21/2020  . Cocaine abuse ([place]) 01/21/2020  . Hip pain 01/21/2020  . Major depression 01/20/2020  . Gastroesophageal reflux disease with esophagitis   . Morbid obesity ([place]) 09/19/2017  . Arthritis of knee 11/03/2015    Patient Centered Plan: Patient is on  the following Treatment Plan(s):  Depression   Referrals to Alternative Service(s): Referred to Alternative Service(s):   Place:   Date:   Time:    Referred to Alternative Service(s):   Place:   Date:   Time:    Referred to Alternative Service(s):   Place:   Date:   Time:    Referred to Alternative Service(s):   Place:   Date:   Time:     Kathi Ludwig, LCAS

## 2020-04-26 NOTE — ED Provider Notes (Signed)
[facility] Emergency Department Provider Note  ____________________________________________   Event Date/Time   First MD Initiated Contact with Patient 04/26/20 1935     (approximate)  I have reviewed the triage vital signs and the nursing notes.   HISTORY  Chief Complaint Suicidal   HPI Samuel Molina is a 60 y.o. male with past medical history of depression, GERD, obesity, Bell's palsy, anxiety, malingering, and polysubstance abuse as well as recent psychiatric evaluation this afternoon at approximately 1642 after patient initially came into the emergency room complaining of suicidality and reported overdose returning again saying he is still feeling suicidal.  When asked him how I could help him if Dr. Toni Amend and deemed hospitalization not be of any benefit at this time he replied "nothing".  He said "what if I just die".  He denies any hallucinations or other acute concerns at this time but refuses to engage any further stating "were done".         Past Medical History:  Diagnosis Date  . Anxiety   . Depression   . GERD (gastroesophageal reflux disease)   . Obesity   . Stroke (HCC)   . Substance abuse [facility])     Patient Active Problem List   Diagnosis Date Noted  . Bell's palsy 04/26/2020  . Malingering 03/25/2020  . Personality disorder (HCC) 02/13/2020  . Bursitis 02/13/2020  . Substance induced mood disorder (HCC) 02/12/2020  . 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 Cocaine abuse (HCC) 01/21/2020  . Hip pain 01/21/2020  . Major depression 01/20/2020  . Gastroesophageal reflux disease with esophagitis   . Morbid obesity (HCC) 09/19/2017  . Arthritis of knee 11/03/2015    Past Surgical History:  Procedure Laterality Date  . ESOPHAGOGASTRODUODENOSCOPY (EGD) WITH PROPOFOL N/A 09/20/2017   Procedure: ESOPHAGOGASTRODUODENOSCOPY (EGD) WITH PROPOFOL;  Surgeon: Toney Reil, MD;  Location:  [facility] ENDOSCOPY;  Service: Gastroenterology;  Laterality: N/A;  . KNEE SURGERY Right     Prior to Admission medications   Medication Sig Start Date End Date Taking? Authorizing Provider  aspirin EC 81 MG tablet Take 81 mg by mouth daily. Swallow whole.    [provider]  atorvastatin (LIPITOR) 80 MG tablet Take 80 mg by mouth every evening.    [provider]  citalopram (CELEXA) 10 MG tablet Take 1 tablet (10 mg total) by mouth daily. 03/30/20   Rankin, Shuvon B, NP  gabapentin (NEURONTIN) 400 MG capsule Take 400 mg by mouth 3 (three) times daily.    [provider]  lisinopril (ZESTRIL) 5 MG tablet Take 5 mg by mouth daily.    [provider]  pantoprazole (PROTONIX) 40 MG tablet Take 2 tablets (80 mg total) by mouth daily. Patient taking differently: Take 40 mg by mouth daily. 02/04/20   Clapacs, Jackquline Denmark, MD  QUEtiapine (SEROQUEL) 25 MG tablet Take 1 tablet (25 mg total) by mouth 2 (two) times daily. Patient not taking: Reported on 04/26/2020 03/30/20   Rankin, Shuvon B, NP  QUEtiapine (SEROQUEL) 300 MG tablet Take 1 tablet (300 mg total) by mouth at bedtime. Patient not taking: Reported on 04/26/2020 03/30/20   Oneta Rack, NP  tiZANidine (ZANAFLEX) 2 MG tablet Take 2 mg by mouth every 8 (eight) hours as needed for muscle spasms.    [provider]  omeprazole (PRILOSEC OTC) 20 MG tablet Take 2 tablets (40 mg total) by mouth 2 (two) times daily before a meal. 09/19/17 01/29/19  Lin Landsman, MD    Allergies Acetaminophen  History reviewed. No pertinent family history.  Social History Social History   Tobacco Use  . Smoking status: Current Every Day Smoker    Packs/day: 0.50    Types: Cigarettes  . Smokeless tobacco: Never Used  Vaping Use  . Vaping Use: Never used  Substance Use Topics  . Alcohol use: Not Currently  . Drug use: Yes    Types: Cocaine    Review of Systems  Review of Systems  Psychiatric/Behavioral: Positive  for suicidal ideas.    Patient refuses to further review of systems.  ____________________________________________   PHYSICAL EXAM:  VITAL SIGNS: ED Triage Vitals  Enc Vitals Group     BP 04/26/20 1926 (!) 167/102     Pulse Rate 04/26/20 1926 64     Resp 04/26/20 1926 16     Temp 04/26/20 1926 98.2 F (36.8 C)     Temp Source 04/26/20 1926 Oral     SpO2 04/26/20 1926 95 %     Weight 04/26/20 1927 216 lb 0.8 oz (98 kg)     Height 04/26/20 1927 6\' 1"  (1.854 m)     Head Circumference --      Peak Flow --      Pain Score 04/26/20 1927 10     Pain Loc --      Pain Edu? --      Excl. in [place]? --    Vitals:   04/26/20 1926  BP: (!) 167/102  Pulse: 64  Resp: 16  Temp: 98.2 F (36.8 C)  SpO2: 95%   Physical Exam Vitals and nursing note reviewed.  Constitutional:      General: He is not in acute distress.    Appearance: He is not ill-appearing.  HENT:     Head: Normocephalic and atraumatic.     Right Ear: External ear normal.     Left Ear: External ear normal.     Nose: Nose normal.  Eyes:     Conjunctiva/sclera: Conjunctivae normal.  Cardiovascular:     Rate and Rhythm: Normal rate.  Pulmonary:     Effort: Pulmonary effort is normal.  Abdominal:     General: There is no distension.  Musculoskeletal:        General: No deformity.     Cervical back: No rigidity.  Neurological:     Mental Status: He is alert.     Cranial Nerves: Facial asymmetry present.  Psychiatric:        Mood and Affect: Mood is depressed. Affect is angry.      ____________________________________________   ____________________________________________   PROCEDURES  Procedure(s) performed (including Critical Care):  Procedures   ____________________________________________   INITIAL IMPRESSION / ASSESSMENT AND PLAN / ED COURSE      Patient presents less than 6 hours after being discharged after being cleared for outpatient follow-up by psychiatry after initially presenting  with complaints about suicidality and overdose.  On return to the emergency room patient again is complaining of persistent suicidality without any other clear acute concerns although he does not wish to engage any further conversation to me after voiced that I did not think he would likely be benefiting from hospitalization after a an opportunity to review again [name] is evaluation he performed this afternoon around 4:30 PM.  Per review of Dr. Lasandra Beech note from 16:42 today: " Patient frequents the emergency room quite a bit.  Has a history of frank malingering in the  past.  Multiple presentations often 1 after another claiming to have taken overdoses of pills never with any clear evidence that he gets done so.  The most consistent finding is his ongoing use of cocaine.  Patient has been given the benefit of the doubt and prescribed antidepressants but not followed up with outpatient treatment and has been referred to substance abuse treatment programs more than once only to drop out of the programs almost immediately and then blame his failure on others."  "Treatment Plan Summary: Medication management and Plan His Bell's palsy, which does seem to be a genuine finding, seems to have been quite a emotional blow to him.  He is fixated on the idea that he had a catastrophic stroke from which he will never recover despite all evidence to the contrary.  Patient presents as histrionic entitled demanding as usual.  Very unlikely to benefit from inpatient treatment.  Restarted outpatient medicines such as I could document them.  Probably will be released from IVC and encouraged to finally follow up with outpatient treatment. Patient does not meet criteria for psychiatric inpatient admission."  Given patient does not wish to discuss anything he denies any other acute events or concerns between discharge and return to emergency room do not believe repeat psychiatry consult is indicated at this time.  Advised  patient to follow-up with outpatient providers.  Discharged stable condition. ____________________________________________   FINAL CLINICAL IMPRESSION(S) / ED DIAGNOSES  Final diagnoses:  Suicidal ideation    Medications - No data to display   ED Discharge Orders    None       Note:  This document was prepared using Dragon voice recognition software and may include unintentional dictation errors.   Lucrezia Starch, MD 04/26/20 [phone]

## 2020-04-26 NOTE — ED Notes (Addendum)
RN entered nurse station and heard patient yelling and cursing at staff.  Pt agitated that  staff did not know the exact time lunch would arrive.   Security officer had to close nurses station door because patient was escalating verbally.

## 2020-04-26 NOTE — ED Notes (Signed)
RN spoke to patient at length about MD decision. Pt agreeable to take taxi home but frustrated with discharge.

## 2020-04-26 NOTE — ED Notes (Addendum)
Pts belongings:  Advice worker Black socks Black sneakers Shirt Blue book Control and instrumentation engineer boxers
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
## 2020-04-26 NOTE — ED Provider Notes (Signed)
Patient cleared for discharge by psychiatry.  Discharged stable condition   Gilles Chiquito, MD 04/26/20 [phone]

## 2020-04-26 NOTE — ED Notes (Signed)
Pt complaining the medications ordered (and given) were not all the pills he has been prescribed by [facility].

## 2020-04-26 NOTE — ED Triage Notes (Addendum)
Pt was discharged 1 hour ago after being evaluated for SI. Pt returning because he continues to have SI and has all the medications with him that he plans to OD on. Pt Is calm and cooperative in triage.   Pt reporting headache at this time with hx in December of stroke and bleeding in his brain after tPa administration (per pt)

## 2020-04-26 NOTE — ED Notes (Signed)
Pt given breakfast tray

## 2020-04-26 NOTE — ED Notes (Signed)
Pt medically cleared at this time per Dr Larinda Buttery

## 2020-04-26 NOTE — ED Notes (Signed)
Pt discharged home. Refused  VS and paperwork.  All belongings returned to patient.

## 2020-04-26 NOTE — ED Notes (Addendum)
Report to [facility] att, Daniele,   Obs 6 hours for Citalopram < 600 mg, and obs 6 hours for remainder of meds,  Potential CNS/resp depression, hypotension risk  Give fluids, and 1 x 4 mg Zofran if GI upset & rep[eat EKG  Mag level and optimize to upper limit that and potassium for infusion  QRS > 120 treat 1 Na Bicarb 1-2 mEq/kg    Symptomatic brady give atropine  Repeat acetaminophen 5 hours after ingestion

## 2020-04-26 NOTE — ED Provider Notes (Signed)
[facility] Emergency Department Provider Note   ____________________________________________   Event Date/Time   First MD Initiated Contact with Patient 04/26/20 [phone]     (approximate)  I have reviewed the triage vital signs and the nursing notes.   HISTORY  Chief Complaint Suicidal, Suicide Attempt, and Depression    HPI Samuel Molina is a 60 y.o. male brought to the ED via EMS from home secondary to suicide attempt by overdose.  Patient lost his spouse 6 months ago, had a stroke requiring TPA and subsequent head bleed in December with residual facial droop and left-sided weakness.  Tonight he used cocaine with his cousins who then made fun of him for crying over his deceased spouse.  Patient then took a handful of his home medications: 5-6 pills of each gabepentin 400mg , atorvastatin 80mg , pantoprazole 40mg , lisinopril 5mg , citalopram 20mg , tizanidine 2mg  approx 30 minutes prior to arrival.  Since he has arrived, he has found the staff to be very caring and helpful, and currently denies active SI/HI/AH/VH.  Voices no medical complaints.  Specifically denies cough, fever, chest pain, shortness of breath, abdominal pain, nausea, vomiting, dizziness, headache     Past Medical History:  Diagnosis Date  . Anxiety   . Depression   . GERD (gastroesophageal reflux disease)   . Obesity   . Stroke ([place])   . Substance abuse [facility])     Patient Active Problem List   Diagnosis Date Noted  . Malingering 03/25/2020  . Personality disorder ([place]) 02/13/2020  . Bursitis 02/13/2020  . Substance induced mood disorder ([place]) 02/12/2020  . Severe recurrent major depression without psychotic features ([place]) 01/21/2020  . Cocaine abuse ([place]) 01/21/2020  . Hip pain 01/21/2020  . Major depression 01/20/2020  . Gastroesophageal reflux disease with esophagitis   . Morbid obesity ([place]) 09/19/2017  . Arthritis of knee 11/03/2015    Past Surgical History:  Procedure  Laterality Date  . ESOPHAGOGASTRODUODENOSCOPY (EGD) WITH PROPOFOL N/A 09/20/2017   Procedure: ESOPHAGOGASTRODUODENOSCOPY (EGD) WITH PROPOFOL;  Surgeon: Lin Landsman, MD;  Location: [place];  Service: Gastroenterology;  Laterality: N/A;  . KNEE SURGERY Right     Prior to Admission medications   Medication Sig Start Date End Date Taking? Authorizing Provider  citalopram (CELEXA) 10 MG tablet Take 1 tablet (10 mg total) by mouth daily. 03/30/20   Rankin, Shuvon B, NP  meloxicam (MOBIC) 15 MG tablet Take 1 tablet (15 mg total) by mouth daily. Patient not taking: Reported on 03/29/2020 02/25/20   [name], MD  methocarbamol (ROBAXIN) 500 MG tablet Take 1 tablet (500 mg total) by mouth every 8 (eight) hours as needed for muscle spasms. 03/02/20   Vladimir Crofts, MD  pantoprazole (PROTONIX) 40 MG tablet Take 2 tablets (80 mg total) by mouth daily. Patient not taking: Reported on 03/29/2020 02/04/20   Clapacs, [name], MD  QUEtiapine (SEROQUEL) 25 MG tablet Take 1 tablet (25 mg total) by mouth 2 (two) times daily. 03/30/20   Rankin, Shuvon B, NP  QUEtiapine (SEROQUEL) 300 MG tablet Take 1 tablet (300 mg total) by mouth at bedtime. 03/30/20   Derrill Center, NP  omeprazole (PRILOSEC OTC) 20 MG tablet Take 2 tablets (40 mg total) by mouth 2 (two) times daily before a meal. 09/19/17 01/29/19  Lin Landsman, MD    Allergies Acetaminophen  No family history on file.  Social History Social History   Tobacco Use  . Smoking status: Current Every Day Smoker  Packs/day: 0.50    Types: Cigarettes  . Smokeless tobacco: Never Used  Vaping Use  . Vaping Use: Never used  Substance Use Topics  . Alcohol use: Not Currently  . Drug use: Yes    Types: Cocaine    Review of Systems  Constitutional: No fever/chills Eyes: No visual changes. ENT: No sore throat. Cardiovascular: Denies chest pain. Respiratory: Denies shortness of breath. Gastrointestinal: No abdominal pain.  No  nausea, no vomiting.  No diarrhea.  No constipation. Genitourinary: Negative for dysuria. Musculoskeletal: Negative for back pain. Skin: Negative for rash. Neurological: Negative for headaches, focal weakness or numbness. Psychiatric: Positive for depression with suicide attempt by overdose.  ____________________________________________   PHYSICAL EXAM:  VITAL SIGNS: ED Triage Vitals  Enc Vitals Group     BP 04/26/20 0141 (!) 149/71     Pulse Rate 04/26/20 0141 60     Resp 04/26/20 0141 18     Temp 04/26/20 0141 98.7 F (37.1 C)     Temp Source 04/26/20 0141 Oral     SpO2 04/26/20 0141 98 %     Weight 04/26/20 0142 216 lb 0.8 oz (98 kg)     Height 04/26/20 0142 6\' 1"  (1.854 m)     Head Circumference --      Peak Flow --      Pain Score 04/26/20 0141 0     Pain Loc --      Pain Edu? --      Excl. in [place]? --     Constitutional: Alert and oriented.  Chronically ill appearing and in no acute distress. Eyes: Conjunctivae are normal. PERRL. EOMI. Head: Atraumatic. Nose: No congestion/rhinnorhea. Mouth/Throat: Mucous membranes are moist.   Neck: No stridor.   Cardiovascular: Normal rate, regular rhythm. Grossly normal heart sounds.  Good peripheral circulation. Respiratory: Normal respiratory effort.  No retractions. Lungs CTAB. Gastrointestinal: Soft and nontender. No distention. No abdominal bruits. No CVA tenderness. Musculoskeletal: No lower extremity tenderness nor edema.  No joint effusions. Neurologic: Alert and oriented x3.  Facial droop noted.  Baseline left-sided weakness compared to the right.  Normal speech and language. No gross focal neurologic deficits are appreciated. MAEx4. Skin:  Skin is warm, dry and intact. No rash noted. Psychiatric: Mood and affect are tearful. Speech and behavior are normal.  ____________________________________________   LABS (all labs ordered are listed, but only abnormal results are displayed)  Labs Reviewed  COMPREHENSIVE  METABOLIC PANEL - Abnormal; Notable for the following components:      Result Value   Potassium 3.2 (*)    Glucose, Bld 121 (*)    BUN 22 (*)    Calcium 8.7 (*)    All other components within normal limits  SALICYLATE LEVEL - Abnormal; Notable for the following components:   Salicylate Lvl [id number] (*)    All other components within normal limits  ACETAMINOPHEN LEVEL - Abnormal; Notable for the following components:   Acetaminophen (Tylenol), Serum <10 (*)    All other components within normal limits  CBC - Abnormal; Notable for the following components:   WBC 11.8 (*)    All other components within normal limits  URINE DRUG SCREEN, QUALITATIVE ([facility] ONLY) - Abnormal; Notable for the following components:   Cocaine Metabolite,Ur [place] POSITIVE (*)    All other components within normal limits  RESP PANEL BY RT-PCR (FLU A&B, COVID) ARPGX2  ETHANOL  MAGNESIUM  TROPONIN I (HIGH SENSITIVITY)   ____________________________________________  EKG  ED ECG REPORT I, Tynasia Mccaul  J, the attending physician, personally viewed and interpreted this ECG.   Date: 04/26/2020  EKG Time: 0222  Rate: 55  Rhythm: sinus bradycardia  Axis: Normal  Intervals:none  ST&T Change: Nonspecific  ____________________________________________  RADIOLOGY I, Zyon Rosser J, personally viewed and evaluated these images (plain radiographs) as part of my medical decision making, as well as reviewing the written report by the radiologist.  ED MD interpretation: None  Official radiology report(s): No results found.  ____________________________________________   PROCEDURES  Procedure(s) performed (including Critical Care):  .1-3 Lead EKG Interpretation Performed by: Irean Hong, MD Authorized by: Irean Hong, MD     Interpretation: normal     ECG rate:  60   ECG rate assessment: normal     Rhythm: sinus rhythm     Ectopy: none     Conduction: normal   Comments:     Patient placed on cardiac monitor to  evaluate for arrhythmias     ____________________________________________   INITIAL IMPRESSION / ASSESSMENT AND PLAN / ED COURSE  As part of my medical decision making, I reviewed the following data within the electronic MEDICAL RECORD NUMBER Nursing notes reviewed and incorporated, Labs reviewed, EKG interpreted, Old chart reviewed, A consult was requested and obtained from this/these consultant(s) Psychiatry and Notes from prior ED visits     60 year old male presenting status post intentional overdose.  Will IVC patients for his safety.  Will contact poison control.  Initiate IV fluid resuscitation. The patient has been placed in psychiatric observation due to the need to provide a safe environment for the patient while obtaining psychiatric consultation and evaluation, as well as ongoing medical and medication management to treat the patient's condition.  The patient has been placed under full IVC at this time.      ____________________________________________   FINAL CLINICAL IMPRESSION(S) / ED DIAGNOSES  Final diagnoses:  Moderate episode of recurrent major depressive disorder (HCC)  Suicidal ideation  Cocaine use     ED Discharge Orders    None      *Please note:  Samuel Molina was evaluated in Emergency Department on 04/26/2020 for the symptoms described in the history of present illness. He was evaluated in the context of the global COVID-19 pandemic, which necessitated consideration that the patient might be at risk for infection with the SARS-CoV-2 virus that causes COVID-19. Institutional protocols and algorithms that pertain to the evaluation of patients at risk for COVID-19 are in a state of rapid change based on information released by regulatory bodies including the CDC and federal and state organizations. These policies and algorithms were followed during the patient's care in the ED.  Some ED evaluations and interventions may be delayed as a result of limited  staffing during and the pandemic.*   Note:  This document was prepared using Dragon voice recognition software and may include unintentional dictation errors.   Irean Hong, MD 04/26/20 [phone]

## 2020-04-27 ENCOUNTER — Other Ambulatory Visit: Payer: Self-pay

## 2020-04-27 ENCOUNTER — Emergency Department
Admission: EM | Admit: 2020-04-27 | Discharge: 2020-05-04 | Disposition: A | Discharge status: Home / Self Care | Payer: Medicaid Other | Attending: Emergency Medicine | Admitting: Emergency Medicine

## 2020-04-27 DIAGNOSIS — F1410 Cocaine abuse, uncomplicated: Secondary | ICD-10-CM | POA: Diagnosis present

## 2020-04-27 DIAGNOSIS — F609 Personality disorder, unspecified: Secondary | ICD-10-CM | POA: Diagnosis present

## 2020-04-27 DIAGNOSIS — F329 Major depressive disorder, single episode, unspecified: Secondary | ICD-10-CM | POA: Diagnosis present

## 2020-04-27 DIAGNOSIS — F332 Major depressive disorder, recurrent severe without psychotic features: Secondary | ICD-10-CM | POA: Diagnosis present

## 2020-04-27 DIAGNOSIS — F17210 Nicotine dependence, cigarettes, uncomplicated: Secondary | ICD-10-CM | POA: Insufficient documentation

## 2020-04-27 DIAGNOSIS — F1994 Other psychoactive substance use, unspecified with psychoactive substance-induced mood disorder: Secondary | ICD-10-CM | POA: Diagnosis present

## 2020-04-27 DIAGNOSIS — Z20822 Contact with and (suspected) exposure to covid-19: Secondary | ICD-10-CM | POA: Insufficient documentation

## 2020-04-27 DIAGNOSIS — F32A Depression, unspecified: Secondary | ICD-10-CM

## 2020-04-27 DIAGNOSIS — G510 Bell's palsy: Secondary | ICD-10-CM

## 2020-04-27 DIAGNOSIS — Z79899 Other long term (current) drug therapy: Secondary | ICD-10-CM | POA: Insufficient documentation

## 2020-04-27 DIAGNOSIS — Z7982 Long term (current) use of aspirin: Secondary | ICD-10-CM | POA: Insufficient documentation

## 2020-04-27 DIAGNOSIS — R45851 Suicidal ideations: Secondary | ICD-10-CM | POA: Insufficient documentation

## 2020-04-27 LAB — URINALYSIS, COMPLETE (UACMP) WITH MICROSCOPIC
Bacteria, UA: NONE SEEN
Bilirubin Urine: NEGATIVE
Glucose, UA: NEGATIVE mg/dL
Ketones, ur: NEGATIVE mg/dL
Leukocytes,Ua: NEGATIVE
Nitrite: NEGATIVE
Protein, ur: NEGATIVE mg/dL
Specific Gravity, Urine: 1.023 (ref 1.005–1.030)
Squamous Epithelial / HPF: NONE SEEN (ref 0–5)
pH: 6 (ref 5.0–8.0)

## 2020-04-27 LAB — COMPREHENSIVE METABOLIC PANEL
ALT: 17 U/L (ref 0–44)
AST: 20 U/L (ref 15–41)
Albumin: 3.9 g/dL (ref 3.5–5.0)
Alkaline Phosphatase: 92 U/L (ref 38–126)
Anion gap: 6 (ref 5–15)
BUN: 19 mg/dL (ref 6–20)
CO2: 29 mmol/L (ref 22–32)
Calcium: 9 mg/dL (ref 8.9–10.3)
Chloride: 106 mmol/L (ref 98–111)
Creatinine, Ser: 0.97 mg/dL (ref 0.61–1.24)
GFR, Estimated: >60 mL/min (ref >60)
Glucose, Bld: 108 mg/dL — ABNORMAL HIGH (ref 70–99)
Potassium: 4 mmol/L (ref 3.5–5.1)
Sodium: 141 mmol/L (ref 135–145)
Total Bilirubin: 0.7 mg/dL (ref 0.3–1.2)
Total Protein: 7.8 g/dL (ref 6.5–8.1)

## 2020-04-27 LAB — CBC
HCT: 41.8 % (ref 39.0–52.0)
Hemoglobin: 13.3 g/dL (ref 13.0–17.0)
MCH: 26.1 pg (ref 26.0–34.0)
MCHC: 31.8 g/dL (ref 30.0–36.0)
MCV: 82 fL (ref 80.0–100.0)
Platelets: 287 10*3/uL (ref 150–400)
RBC: 5.1 MIL/uL (ref 4.22–5.81)
RDW: 14.9 % (ref 11.5–15.5)
WBC: 8.8 10*3/uL (ref 4.0–10.5)
nRBC: 0 % (ref 0.0–0.2)

## 2020-04-27 LAB — URINE DRUG SCREEN, QUALITATIVE (ARMC ONLY)
Amphetamines, Ur Screen: NOT DETECTED
Barbiturates, Ur Screen: NOT DETECTED
Benzodiazepine, Ur Scrn: NOT DETECTED
Cannabinoid 50 Ng, Ur ~~LOC~~: NOT DETECTED
Cocaine Metabolite,Ur ~~LOC~~: POSITIVE — AB
MDMA (Ecstasy)Ur Screen: NOT DETECTED
Methadone Scn, Ur: NOT DETECTED
Opiate, Ur Screen: NOT DETECTED
Phencyclidine (PCP) Ur S: NOT DETECTED
Tricyclic, Ur Screen: NOT DETECTED

## 2020-04-27 LAB — ETHANOL: Alcohol, Ethyl (B): <10 mg/dL (ref <10)

## 2020-04-27 LAB — SALICYLATE LEVEL: Salicylate Lvl: <7 mg/dL — ABNORMAL LOW (ref 7.0–30.0)

## 2020-04-27 LAB — ACETAMINOPHEN LEVEL: Acetaminophen (Tylenol), Serum: <10 ug/mL — ABNORMAL LOW (ref 10–30)

## 2020-04-27 MED ORDER — ATORVASTATIN CALCIUM 20 MG PO TABS
80.0000 mg | ORAL_TABLET | Freq: Every evening | ORAL | Status: DC
  Administered 2020-04-27 – 2020-05-03 (×6): 80 mg via ORAL
  Filled 2020-04-27 (×6): qty 4

## 2020-04-27 MED ORDER — QUETIAPINE FUMARATE 25 MG PO TABS
25.0000 mg | ORAL_TABLET | Freq: Two times a day (BID) | ORAL | Status: DC
  Administered 2020-04-27 – 2020-04-29 (×3): 25 mg via ORAL
  Filled 2020-04-27 (×3): qty 1

## 2020-04-27 MED ORDER — ASPIRIN EC 81 MG PO TBEC
81.0000 mg | DELAYED_RELEASE_TABLET | Freq: Every day | ORAL | Status: DC
  Administered 2020-04-27 – 2020-05-04 (×8): 81 mg via ORAL
  Filled 2020-04-27 (×8): qty 1

## 2020-04-27 MED ORDER — LISINOPRIL 5 MG PO TABS
5.0000 mg | ORAL_TABLET | Freq: Every day | ORAL | Status: DC
  Administered 2020-04-27 – 2020-05-04 (×8): 5 mg via ORAL
  Filled 2020-04-27 (×8): qty 1

## 2020-04-27 MED ORDER — CITALOPRAM HYDROBROMIDE 20 MG PO TABS
10.0000 mg | ORAL_TABLET | Freq: Every day | ORAL | Status: DC
  Administered 2020-04-27 – 2020-05-04 (×8): 10 mg via ORAL
  Filled 2020-04-27 (×8): qty 1

## 2020-04-27 MED ORDER — PANTOPRAZOLE SODIUM 40 MG PO TBEC
40.0000 mg | DELAYED_RELEASE_TABLET | Freq: Every day | ORAL | Status: DC
  Administered 2020-04-27 – 2020-05-04 (×8): 40 mg via ORAL
  Filled 2020-04-27 (×8): qty 1

## 2020-04-27 MED ORDER — GABAPENTIN 300 MG PO CAPS
400.0000 mg | ORAL_CAPSULE | Freq: Three times a day (TID) | ORAL | Status: DC
  Administered 2020-04-27 – 2020-05-04 (×19): 400 mg via ORAL
  Filled 2020-04-27 (×20): qty 1

## 2020-04-27 MED ORDER — QUETIAPINE FUMARATE 25 MG PO TABS
300.0000 mg | ORAL_TABLET | Freq: Every day | ORAL | Status: DC
  Administered 2020-04-27 – 2020-04-28 (×2): 300 mg via ORAL
  Filled 2020-04-27 (×2): qty 1

## 2020-04-27 NOTE — ED Notes (Signed)
VOL  CONSULT  DONE  PENDING  PLACEMENT 

## 2020-04-27 NOTE — ED Notes (Signed)
Hourly rounding reveals patient in room. No complaints, stable, in no acute distress. Q15 minute rounds and monitoring via Security Cameras to continue. 

## 2020-04-27 NOTE — ED Notes (Signed)
Patient transferred from Triage to room [facility] after dressing out and screening for contraband. Report received from Raquel, RN including situation, background, assessment and recommendations. Pt oriented to [employer] including Q15 minute rounds as well as Psychologist, counselling for their protection. Patient is alert and oriented, warm and dry in no acute distress. Patient denies AH. Pt. Encouraged to let this nurse know if needs arise.
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
## 2020-04-27 NOTE — BH Assessment (Signed)
Comprehensive Clinical Assessment (CCA) Screening, Triage and Referral Note  04/27/2020 Samuel Molina [id number]   Samuel Molina is a 60 y.o male who presents to [facility] ED voluntarily for treatment. Per triage note, Pt here for SI and HI has been here recently for the same. States did overdose on home meds. Pt states he took 4 extra tabs of each of meds 1 hr pta.  atorvastatin 80 mg, celexa 10mg , lisinopril 5mg , protonix 40mg , gabapentin 400 mg at 0116.  During TTS assessment pt presents alert and oriented x 4, restless, irritable but cooperative, and mood-congruent with affect. The pt does not appear to be responding to internal or external stimuli. Neither is the pt presenting with any delusional thinking. Pt verified the information provided to triage RN. Pt identified his main complaint to be SI/HI without a current plan. Pt reports to feel depressed and to be afraid of what he might do to himself if he is dsicharged. Pt expressed HI towards other pt's currently located in the ED as well as, Dr. due to his recent discharge. Pt reports to have no specific homicial plan but states "if I get pissed off enough, I will use my hands to hurt someone".  Per pt's chart pt has hx of malingering behaviors, was recently discharged on 04/27/19 and returned a few hours later reporting the same symptoms. Pt identified the recent loss of his wife (6 months ago), hopelessness, struggles with anger management, recent stroke (04/01/20) and SA as triggers to his current depression symptoms. Pt reports to be unsure of his MH hx and denies a family hx of MH/SA/SI. Pt denies any access to weapons. Pt admits to recently abusing his medications but is unable to provide more information at this time stating, "I don't know I just took a bunch pills because I don't want tp ne here anymore". Pt reports an INPT hx with [facility] and denies any current OPT. Pt states "I don't need any resources I need to go to a facility for more  than a few days".  Pt reports a SA hx of cocaine and ETHO. Pt reports his last use of cocaine to be a few days ago and to be unsure of how much or how often he uses. Pt denies any current AH/VH and reports to currently need a referral to a long-term structured facility. Pt reports to live with his sister 06/25/19 [phone]) but is currently refusing for [name] to be contacted at this time.   Finals disposition is pending psych consult   Chief Complaint:  Chief Complaint  Patient presents with  . Suicidal   Visit Diagnosis: MDD  Patient Reported Information How did you hear about [name]? Self   Referral name: Self   Referral phone number: [phone]  Whom do you see for routine medical problems? I don't have a doctor   Practice/Facility Name: No data recorded  Practice/Facility Phone Number: No data recorded  Name of Contact: No data recorded  Contact Number: No data recorded  Contact Fax Number: No data recorded  Prescriber Name: No data recorded  Prescriber Address (if known): No data recorded What Is the Reason for Your Visit/Call Today? SI/HI  How Long Has This Been Causing You Problems? <Week  Have You Recently Been in Any Inpatient Treatment (Hospital/Detox/Crisis Center/28-Day Program)? Yes   Name/Location of Program/Hospital:[facility]   How Long Were You There? 2 Days   When Were You Discharged? 02/16/2020  Have You Ever Received Services From [place] Before? No  Who Do You See at [facility]? No data recorded Have You Recently Had Any Thoughts About Hurting Yourself? Yes   Are You Planning to Commit Suicide/Harm Yourself At This time?  No  Have you Recently Had Thoughts About [place]? Yes (Hi is currently directed towards another pt and [name])   Explanation: No data recorded Have You Used Any Alcohol or Drugs in the Past 24 Hours? Yes   How Long Ago Did You Use Drugs or Alcohol?  2000   What Did You Use and How Much? Cocaine; unspecified  amount  What Do You Feel Would Help You the Most Today? Therapy; Medication  Do You Currently Have a Therapist/Psychiatrist? No   Name of Therapist/Psychiatrist: No data recorded  Have You Been Recently Discharged From Any Office Practice or Programs? No   Explanation of Discharge From Practice/Program:  No data recorded    CCA Screening Triage Referral Assessment Type of Contact: Face-to-Face   Is this Initial or Reassessment? Initial  Date Telepsych consult ordered in [facility]:  04/27/2020   Time Telepsych consult ordered in [facility]:  0215  Patient Reported Information Reviewed? Yes   Patient Left Without Being Seen? No data recorded  Reason for Not Completing Assessment: No data recorded Collateral Involvement: Pt provided his sister Samuel Molina, [phone]) and provided consent for her to be contatced for emergency purposes only  Does Patient Have a Court Appointed Legal Guardian? No data recorded  Name and Contact of Legal Guardian:  Self  If Minor and Not Living with Parent(s), Who has Custody? n/a  Is CPS involved or ever been involved? Never  Is APS involved or ever been involved? Never  Patient Determined To Be At Risk for Harm To Self or Others Based on Review of Patient Reported Information or Presenting Complaint? Yes, for Harm to Others   Method: No Plan   Availability of Means: No access or NA   Intent: Vague intent or NA   Notification Required: Identifiable person is aware   Additional Information for Danger to Others Potential:  -- (unknown)   Additional Comments for Danger to Others Potential:  No data recorded  Are There Guns or Other Weapons in Your Home?  No    Types of Guns/Weapons: No data recorded   Are These Weapons Safely Secured?                              No data recorded   Who Could Verify You Are Able To Have These Secured:    No data recorded Do You Have any Outstanding Charges, Pending Court Dates, Parole/Probation? no  Contacted To Inform  of Risk of Harm To Self or Others: Intended Target for Harm to Others:  Location of Assessment: [facility] ED  Does Patient Present under Involuntary Commitment? No   IVC Papers Initial File Date: 04/26/2020   [place] of Residence: [place]  Patient Currently Receiving the Following Services: Not Receiving Services   Determination of Need: Urgent (48 hours)   Options For Referral: Outpatient Therapy   Shanon Ace, LCSWA

## 2020-04-27 NOTE — ED Notes (Signed)
Pt sitting in bed, requesting to see charge nurse. Pt assessed by this nurse, pt tells story of stroke Dec. 9 and not receiving tx he needed here then going to [facility] 9 hours later and receiving TPA. PT has left sided facial droop and states he was dx with bells palsy here. Pt tells of wife dying 1 month ago. Pt reports he has been here and was DC'd, states he feels hopeless, will become agitated if he does not get his way, wants to be moved so he is not in the hall, refuses to take any medication if he is not aware of reason, use and SE and if he does not agree. Pt is very demanding and is telling this nurse how is care will take place. Pt updated on plan of care and process of ER. Pt states he vomited in triage and vomited up majority of his medications that he took at home. A&O x 4

## 2020-04-27 NOTE — ED Notes (Signed)
Pt provided with dinner tray after reporting sending his dinner tray back. Ginger Ale for drink.

## 2020-04-27 NOTE — BH Assessment (Addendum)
Writer attempted to assess pt as pt was unarousable. TTS to follow up at a later time.

## 2020-04-27 NOTE — ED Notes (Signed)
Pt reports HI toward Dr. Toni Amend, states he wants to break his neck due to being DC'd yesterday. Pt states that he needs his daily medications, Dr. Scotty Court sent message for request. States he sent food back for dinner and is hungry, Dietary called with no answer.

## 2020-04-27 NOTE — ED Notes (Signed)
Pt given meal tray.

## 2020-04-27 NOTE — ED Provider Notes (Signed)
[facility] Emergency Department Provider Note  ____________________________________________   Event Date/Time   First MD Initiated Contact with Patient 04/27/20 0258     (approximate)  I have reviewed the triage vital signs and the nursing notes.   HISTORY  Chief Complaint Suicidal    HPI Samuel Molina is a 60 y.o. male well-known to the emergency department with 15 visits in the  last 6 months.  He presents tonight for the third time in 24 hours for depression and suicidal as well as homicidal thoughts.  He was evaluated less than 12 hours ago by [name] who felt that malingering was very probable and that the patient did not meet involuntary commitment nor inpatient psychiatric treatment criteria.  The patient subsequently came back within a couple of hours, was seen by another provider almost immediately, and discharged again.  Now the patient presents by EMS.  He says that he is really struggling with the loss of his wife who died 6 months ago as well as his recent stroke and subsequent facial palsy.  He said that he is always had issues with anger management and that now his family members are making fun of him for the loss of his wife as well as the way he talks because of the facial palsy and he wants to hurt or kill them.  He said that he needs help dealing with his issues and that he is afraid he is going to do something to harm himself or someone else and that he needs and wants help.  The symptoms are severe and have been gradual in onset.  Nothing in particular helps or makes anything worse.  He denies shortness of breath, cough, abdominal pain.        Past Medical History:  Diagnosis Date  . Anxiety   . Depression   . GERD (gastroesophageal reflux disease)   . Obesity   . Stroke ([place])   . Substance abuse [facility])     Patient Active Problem List   Diagnosis Date Noted  . Bell's palsy 04/26/2020  . Malingering 03/25/2020  .  Personality disorder ([place]) 02/13/2020  . Bursitis 02/13/2020  . Substance induced mood disorder ([place]) 02/12/2020  . Severe recurrent major depression without psychotic features ([place]) 01/21/2020  . Cocaine abuse ([place]) 01/21/2020  . Hip pain 01/21/2020  . Major depression 01/20/2020  . Gastroesophageal reflux disease with esophagitis   . Morbid obesity ([place]) 09/19/2017  . Arthritis of knee 11/03/2015    Past Surgical History:  Procedure Laterality Date  . ESOPHAGOGASTRODUODENOSCOPY (EGD) WITH PROPOFOL N/A 09/20/2017   Procedure: ESOPHAGOGASTRODUODENOSCOPY (EGD) WITH PROPOFOL;  Surgeon: Lin Landsman, MD;  Location: [place];  Service: Gastroenterology;  Laterality: N/A;  . KNEE SURGERY Right     Prior to Admission medications   Medication Sig Start Date End Date Taking? Authorizing Provider  aspirin EC 81 MG tablet Take 81 mg by mouth daily. Swallow whole.    [provider]  atorvastatin (LIPITOR) 80 MG tablet Take 80 mg by mouth every evening.    [provider]  citalopram (CELEXA) 10 MG tablet Take 1 tablet (10 mg total) by mouth daily. 03/30/20   Rankin, Shuvon B, NP  gabapentin (NEURONTIN) 400 MG capsule Take 400 mg by mouth 3 (three) times daily.    [provider]  lisinopril (ZESTRIL) 5 MG tablet Take 5 mg by mouth daily.    [provider]  pantoprazole (PROTONIX) 40 MG tablet Take 2  tablets (80 mg total) by mouth daily. Patient taking differently: Take 40 mg by mouth daily. 02/04/20   Clapacs, Jackquline [place], MD  QUEtiapine (SEROQUEL) 25 MG tablet Take 1 tablet (25 mg total) by mouth 2 (two) times daily. Patient not taking: Reported on 04/26/2020 03/30/20   Rankin, Shuvon B, NP  QUEtiapine (SEROQUEL) 300 MG tablet Take 1 tablet (300 mg total) by mouth at bedtime. Patient not taking: Reported on 04/26/2020 03/30/20   Oneta Rack, NP  tiZANidine (ZANAFLEX) 2 MG tablet Take 2 mg by mouth every 8 (eight) hours as needed for muscle spasms.     [provider]  omeprazole (PRILOSEC OTC) 20 MG tablet Take 2 tablets (40 mg total) by mouth 2 (two) times daily before a meal. 09/19/17 01/29/19  Toney Reil, MD    Allergies Acetaminophen  No family history on file.  Social History Social History   Tobacco Use  . Smoking status: Current Every Day Smoker    Packs/day: 0.50    Types: Cigarettes  . Smokeless tobacco: Never Used  Vaping Use  . Vaping Use: Never used  Substance Use Topics  . Alcohol use: Not Currently  . Drug use: Yes    Types: Cocaine    Review of Systems Constitutional: No fever/chills Eyes: No visual changes. ENT: No sore throat. Cardiovascular: Denies chest pain. Respiratory: Denies shortness of breath. Gastrointestinal: No abdominal pain.  No nausea, no vomiting.  No diarrhea.  No constipation. Genitourinary: Negative for dysuria. Musculoskeletal: Negative for neck pain.  Negative for back pain. Integumentary: Negative for rash. Neurological: Negative for headaches, focal weakness or numbness. Psychiatric:  Depression, suicidal thoughts, homicidal thoughts  ____________________________________________   PHYSICAL EXAM:  VITAL SIGNS: ED Triage Vitals  Enc Vitals Group     BP 04/27/20 0226 (!) 154/81     Pulse Rate 04/27/20 0226 (!) 53     Resp 04/27/20 0226 20     Temp 04/27/20 0226 98 F (36.7 C)     Temp Source 04/27/20 0226 Oral     SpO2 04/27/20 0226 98 %     Weight 04/27/20 0225 107.5 kg (237 lb)     Height 04/27/20 0225 1.854 m (6\' 1" )     Head Circumference --      Peak Flow --      Pain Score 04/27/20 0225 0     Pain Loc --      Pain Edu? --      Excl. in GC? --     Constitutional: Alert and oriented.  Eyes: Conjunctivae are normal.  Head: Atraumatic. Nose: No congestion/rhinnorhea. Mouth/Throat: Patient is wearing a mask. Neck: No stridor.  No meningeal signs.   Cardiovascular: Normal rate, regular rhythm. Good peripheral circulation. Respiratory: Normal  respiratory effort.  No retractions. Gastrointestinal: Soft and nontender. No distention.  Musculoskeletal: No lower extremity tenderness nor edema. No gross deformities of extremities. Neurologic: Left-sided facial palsy that is apparently baseline after a stroke which occurred about a month ago. Skin:  Skin is warm, dry and intact. Psychiatric: Mood and affect are calm and cooperative with me.  He expresses concerns about his difficulty handling his anger, his desire to harm himself and other people, and has ongoing depression particular in the setting of the loss of his wife and his recent stroke.  ____________________________________________   LABS (all labs ordered are listed, but only abnormal results are displayed)  Labs Reviewed  COMPREHENSIVE METABOLIC PANEL - Abnormal; Notable for the following components:  Result Value   Glucose, Bld 108 (*)    All other components within normal limits  ACETAMINOPHEN LEVEL - Abnormal; Notable for the following components:   Acetaminophen (Tylenol), Serum <10 (*)    All other components within normal limits  SALICYLATE LEVEL - Abnormal; Notable for the following components:   Salicylate Lvl <7.0 (*)    All other components within normal limits  URINALYSIS, COMPLETE (UACMP) WITH MICROSCOPIC - Abnormal; Notable for the following components:   Color, Urine YELLOW (*)    APPearance CLEAR (*)    Hgb urine dipstick SMALL (*)    All other components within normal limits  CBC  ETHANOL  URINE DRUG SCREEN, QUALITATIVE ([facility] ONLY)   ____________________________________________  EKG  No indication for emergent EKG ____________________________________________  RADIOLOGY Samuel Molina, personally viewed and evaluated these images (plain radiographs) as part of my medical decision making, as well as reviewing the written report by the radiologist.  ED MD interpretation: No indication for emergent imaging  Official radiology report(s): No  results found.  ____________________________________________   PROCEDURES   Procedure(s) performed (including Critical Care):  Procedures   ____________________________________________   INITIAL IMPRESSION / MDM / ASSESSMENT AND PLAN / ED COURSE  As part of my medical decision making, I reviewed the following data within the electronic MEDICAL RECORD NUMBER Nursing notes reviewed and incorporated, Labs reviewed , Old chart reviewed, A consult was requested and obtained from this/these consultant(s) Psychiatry, Notes from prior ED visits and [place] Controlled Substance Database   Differential diagnosis includes, but is not limited to, depression, adjustment disorder, mood disorder, substance abuse, malingering.  The patient has been seen by Dr. Toni Amend within the last 24 hours.  He is also had a negative Covid swab within the last 24 hours and I am not repeating it.  The problem that I see is that he is making sense with his comments and concerns about his difficulty managing his emotions, his depression, and his desire to harm himself and other people.  He has multiple reasons that he would have difficulties with all of these issues and he keeps coming back for help.  His secondary gain is unclear unless it is simply to be in a hospital environment where people will take care of him, but it is also possible that he truly is suffering from mental illness and needs additional evaluation.  I do not feel he meets criteria for involuntary commitment and he is here voluntarily.  He has no evidence of any medical condition.  He mention to me that someone at [facility] last month told him that there was "something" on his chest x-ray which was abnormal, but I looked at the records and there was no evidence of anything acute and he has no current medical symptoms.  The patient has been placed in psychiatric observation due to the need to provide a safe environment for the patient while obtaining psychiatric  consultation and evaluation, as well as ongoing medical and medication management to treat the patient's condition.  The patient has not been placed under full IVC at this time.          ____________________________________________  FINAL CLINICAL IMPRESSION(S) / ED DIAGNOSES  Final diagnoses:  Depression, unspecified depression type     MEDICATIONS GIVEN DURING THIS VISIT:  Medications - No data to display   ED Discharge Orders    None      *Please note:  Joshaua Epple was evaluated in Emergency Department on 04/27/2020 for  the symptoms described in the history of present illness. He was evaluated in the context of the global COVID-19 pandemic, which necessitated consideration that the patient might be at risk for infection with the SARS-CoV-2 virus that causes COVID-19. Institutional protocols and algorithms that pertain to the evaluation of patients at risk for COVID-19 are in a state of rapid change based on information released by regulatory bodies including the CDC and federal and state organizations. These policies and algorithms were followed during the patient's care in the ED.  Some ED evaluations and interventions may be delayed as a result of limited staffing during and after the pandemic.*  Note:  This document was prepared using Dragon voice recognition software and may include unintentional dictation errors.   Hinda Kehr, MD 04/27/20 [id number]

## 2020-04-27 NOTE — ED Notes (Signed)
Pt finished with meal. Pt return trash, pt ate 100% of tray, all items returned that were provided.

## 2020-04-27 NOTE — ED Triage Notes (Addendum)
Pt here for SI and HI has been here recently for the same. States did overdose on home meds. Pt states he took 4 extra tabs of each of meds 1 hr pta.  atorvastatin 80 mg, celexa 10mg , lisinopril 5mg ,  protonix 40mg , gabapentin 400 mg at 0116.

## 2020-04-27 NOTE — Consult Note (Signed)
[facility] Face-to-Face Psychiatry Consult   Reason for Consult: Consult for 60 year old man with a history of depressive symptoms and substance abuse who returns to [place] immediately after his last discharge Referring Physician: Marcello Moores Patient Identification: Samuel Molina MRN:  [id number] Principal Diagnosis: Severe recurrent major depression without psychotic features (HCC) Diagnosis:  Principal Problem:   Severe recurrent major depression without psychotic features (HCC) Active Problems:   Major depression   Cocaine abuse (HCC)   Substance induced mood disorder (HCC)   Personality disorder (HCC)   Bell's palsy   Total Time spent with patient: 1 hour  Subjective:   Samuel Molina is a 60 y.o. male patient admitted with "you do not listen to me".  HPI: Patient seen chart reviewed.  Patient familiar from prior interactions.  He was in the emergency room just yesterday with similar complaints and was discharged.  He did not as far as I know even leave the premises of the hospital but immediately came back to the emergency room seeking to be seen again.  Patient tells me that I am not listening to him because he is dangerous to himself if he does not get to go someplace for a long-term.  Continues to report that his mood feels bad all the time.  Describes himself as feeling "not safe with myself".  Talks about thoughts about cutting himself or overdosing on pills.  Admits that he is back to using cocaine regularly outside the hospital.  Denies any psychotic symptoms.  Has not been going to any outpatient treatment.  Denies any new medical problems.  Past Psychiatric History: Past history of multiple presentations to the ER as well as several prior admissions all for pretty much identical complaints.  He has been referred to inpatient rehab programs but has not complied with referral.  He has not complied with recommended outpatient treatment.  Risk to Self:   Risk to Others:   Prior Inpatient  Therapy:   Prior Outpatient Therapy:    Past Medical History:  Past Medical History:  Diagnosis Date  . Anxiety   . Depression   . GERD (gastroesophageal reflux disease)   . Obesity   . Stroke (HCC)   . Substance abuse [facility])     Past Surgical History:  Procedure Laterality Date  . ESOPHAGOGASTRODUODENOSCOPY (EGD) WITH PROPOFOL N/A 09/20/2017   Procedure: ESOPHAGOGASTRODUODENOSCOPY (EGD) WITH PROPOFOL;  Surgeon: Toney Reil, MD;  Location: [facility] ENDOSCOPY;  Service: Gastroenterology;  Laterality: N/A;  . KNEE SURGERY Right    Family History: No family history on file. Family Psychiatric  History: See previous. Social History:  Social History   Substance and Sexual Activity  Alcohol Use Not Currently     Social History   Substance and Sexual Activity  Drug Use Yes  . Types: Cocaine    Social History   Socioeconomic History  . Marital status: Single    Spouse name: Not on file  . Number of children: Not on file  . Years of education: Not on file  . Highest education level: Not on file  Occupational History  . Not on file  Tobacco Use  . Smoking status: Current Every Day Smoker    Packs/day: 0.50    Types: Cigarettes  . Smokeless tobacco: Never Used  Vaping Use  . Vaping Use: Never used  Substance and Sexual Activity  . Alcohol use: Not Currently  . Drug use: Yes    Types: Cocaine  . Sexual activity: Not on file  Other Topics  Concern  . Not on file  Social History Narrative  . Not on file   Social Determinants of Health   Financial Resource Strain: Not on file  Food Insecurity: Not on file  Transportation Needs: Not on file  Physical Activity: Not on file  Stress: Not on file  Social Connections: Not on file   Additional Social History:    Allergies:   Allergies  Allergen Reactions  . Acetaminophen Itching, Nausea And Vomiting and Rash    Labs:  Results for orders placed or performed during the hospital encounter of 04/27/20 (from the  past 48 hour(s))  CBC     Status: None   Collection Time: 04/27/20  2:38 AM  Result Value Ref Range   WBC 8.8 4.0 - 10.5 K/uL   RBC 5.10 4.22 - 5.81 MIL/uL   Hemoglobin 13.3 13.0 - 17.0 g/dL   HCT 38.9 37.3 - 42.8 %   MCV 82.0 80.0 - 100.0 fL   MCH 26.1 26.0 - 34.0 pg   MCHC 31.8 30.0 - 36.0 g/dL   RDW 76.8 11.5 - 72.6 %   Platelets 287 150 - 400 K/uL   nRBC 0.0 0.0 - 0.2 %    Comment: Performed at [facility], [address] [place], [place] [address]  Comprehensive metabolic panel     Status: Abnormal   Collection Time: 04/27/20  2:38 AM  Result Value Ref Range   Sodium 141 135 - 145 mmol/L   Potassium 4.0 3.5 - 5.1 mmol/L   Chloride 106 98 - 111 mmol/L   CO2 29 22 - 32 mmol/L   Glucose, Bld 108 (H) 70 - 99 mg/dL    Comment: Glucose reference range applies only to samples taken after fasting for at least 8 hours.   BUN 19 6 - 20 mg/dL   Creatinine, Ser 9.74 0.61 - 1.24 mg/dL   Calcium 9.0 8.9 - 16.3 mg/dL   Total Protein 7.8 6.5 - 8.1 g/dL   Albumin 3.9 3.5 - 5.0 g/dL   AST 20 15 - 41 U/L   ALT 17 0 - 44 U/L   Alkaline Phosphatase 92 38 - 126 U/L   Total Bilirubin 0.7 0.3 - 1.2 mg/dL   GFR, Estimated >84 >53 mL/min    Comment: (NOTE) Calculated using the CKD-EPI Creatinine Equation (2021)    Anion gap 6 5 - 15    Comment: Performed at [name], [address] [place], [place] [address]  Ethanol     Status: None   Collection Time: 04/27/20  2:38 AM  Result Value Ref Range   Alcohol, Ethyl (B) <10 <10 mg/dL    Comment: (NOTE) Lowest detectable limit for serum alcohol is 10 mg/dL.  For medical purposes only. Performed at [facility], [address] [place], [place] [address]   Acetaminophen level     Status: Abnormal   Collection Time: 04/27/20  2:38 AM  Result Value Ref Range   Acetaminophen (Tylenol), Serum <10 (L) 10 - 30 ug/mL    Comment: (NOTE) Therapeutic concentrations vary significantly. A range of 10-30 ug/mL  may  be an effective concentration for many patients. However, some  are best treated at concentrations outside of this range. Acetaminophen concentrations >150 ug/mL at 4 hours after ingestion  and >50 ug/mL at 12 hours after ingestion are often associated with  toxic reactions.  Performed at [facility], [address], [place], [place] [address]   Salicylate level  Status: Abnormal   Collection Time: 04/27/20  2:38 AM  Result Value Ref Range   Salicylate Lvl Q000111Q (L) 7.0 - 30.0 mg/dL    Comment: Performed at [facility], [address] [place], [place] [address]  Urinalysis, Complete w Microscopic Urine, Clean Catch     Status: Abnormal   Collection Time: 04/27/20  2:38 AM  Result Value Ref Range   Color, Urine YELLOW (A) YELLOW   APPearance CLEAR (A) CLEAR   Specific Gravity, Urine 1.023 1.005 - 1.030   pH 6.0 5.0 - 8.0   Glucose, UA NEGATIVE NEGATIVE mg/dL   Hgb urine dipstick SMALL (A) NEGATIVE   Bilirubin Urine NEGATIVE NEGATIVE   Ketones, ur NEGATIVE NEGATIVE mg/dL   Protein, ur NEGATIVE NEGATIVE mg/dL   Nitrite NEGATIVE NEGATIVE   Leukocytes,Ua NEGATIVE NEGATIVE   RBC / HPF 6-10 0 - 5 RBC/hpf   WBC, UA 0-5 0 - 5 WBC/hpf   Bacteria, UA NONE SEEN NONE SEEN   Squamous Epithelial / LPF NONE SEEN 0 - 5   Mucus PRESENT     Comment: Performed at [facility], [address], [place], [place] [address]  Urine Drug Screen, Qualitative ([facility] only)     Status: Abnormal   Collection Time: 04/27/20  2:38 AM  Result Value Ref Range   Tricyclic, Ur Screen NONE DETECTED NONE DETECTED   Amphetamines, Ur Screen NONE DETECTED NONE DETECTED   MDMA (Ecstasy)Ur Screen NONE DETECTED NONE DETECTED   Cocaine Metabolite,Ur [place] POSITIVE (A) NONE DETECTED   Opiate, Ur Screen NONE DETECTED NONE DETECTED   Phencyclidine (PCP) Ur S NONE DETECTED NONE DETECTED   Cannabinoid 50 Ng, Ur [place] NONE DETECTED NONE DETECTED   Barbiturates, Ur Screen NONE DETECTED NONE  DETECTED   Benzodiazepine, Ur Scrn NONE DETECTED NONE DETECTED   Methadone Scn, Ur NONE DETECTED NONE DETECTED    Comment: (NOTE) Tricyclics + metabolites, urine    Cutoff 1000 ng/mL Amphetamines + metabolites, urine  Cutoff 1000 ng/mL MDMA (Ecstasy), urine              Cutoff 500 ng/mL Cocaine Metabolite, urine          Cutoff 300 ng/mL Opiate + metabolites, urine        Cutoff 300 ng/mL Phencyclidine (PCP), urine         Cutoff 25 ng/mL Cannabinoid, urine                 Cutoff 50 ng/mL Barbiturates + metabolites, urine  Cutoff 200 ng/mL Benzodiazepine, urine              Cutoff 200 ng/mL Methadone, urine                   Cutoff 300 ng/mL  The urine drug screen provides only a preliminary, unconfirmed analytical test result and should not be used for non-medical purposes. Clinical consideration and professional judgment should be applied to any positive drug screen result due to possible interfering substances. A more specific alternate chemical method must be used in order to obtain a confirmed analytical result. Gas chromatography / mass spectrometry (GC/MS) is the preferred confirm atory method. Performed at [facility], [address] [place], [place] [address]     No current facility-administered medications for this encounter.   Current Outpatient Medications  Medication Sig Dispense Refill  . aspirin EC 81 MG tablet Take 81 mg by mouth daily. Swallow whole.    Marland Kitchen atorvastatin (LIPITOR) 80  MG tablet Take 80 mg by mouth every evening.    . citalopram (CELEXA) 10 MG tablet Take 1 tablet (10 mg total) by mouth daily. 30 tablet 0  . gabapentin (NEURONTIN) 400 MG capsule Take 400 mg by mouth 3 (three) times daily.    Marland Kitchen lisinopril (ZESTRIL) 5 MG tablet Take 5 mg by mouth daily.    . pantoprazole (PROTONIX) 40 MG tablet Take 2 tablets (80 mg total) by mouth daily. (Patient taking differently: Take 40 mg by mouth daily.) 60 tablet 1  . QUEtiapine (SEROQUEL) 25 MG  tablet Take 1 tablet (25 mg total) by mouth 2 (two) times daily. (Patient not taking: Reported on 04/26/2020) 60 tablet 0  . QUEtiapine (SEROQUEL) 300 MG tablet Take 1 tablet (300 mg total) by mouth at bedtime. (Patient not taking: Reported on 04/26/2020) 30 tablet 0  . tiZANidine (ZANAFLEX) 2 MG tablet Take 2 mg by mouth every 8 (eight) hours as needed for muscle spasms.      Musculoskeletal: Strength & Muscle Tone: within normal limits Gait & Station: normal Patient leans: N/A  Psychiatric Specialty Exam: Physical Exam Vitals and nursing note reviewed.  Constitutional:      Appearance: He is well-developed and well-nourished.  HENT:     Head: Normocephalic and atraumatic.  Eyes:     Conjunctiva/sclera: Conjunctivae normal.     Pupils: Pupils are equal, round, and reactive to light.  Cardiovascular:     Heart sounds: Normal heart sounds.  Pulmonary:     Effort: Pulmonary effort is normal.  Abdominal:     Palpations: Abdomen is soft.  Musculoskeletal:        General: Normal range of motion.     Cervical back: Normal range of motion.  Skin:    General: Skin is warm and dry.  Neurological:     General: No focal deficit present.     Mental Status: He is alert.  Psychiatric:        Attention and Perception: Attention normal.        Mood and Affect: Mood is anxious. Affect is labile.        Speech: Speech normal.        Behavior: Behavior is cooperative.        Thought Content: Thought content includes suicidal ideation. Thought content does not include suicidal plan.        Cognition and Memory: Cognition normal.        Judgment: Judgment is impulsive.     Review of Systems  Constitutional: Negative.   HENT: Negative.   Eyes: Negative.   Respiratory: Negative.   Cardiovascular: Negative.   Gastrointestinal: Negative.   Musculoskeletal: Negative.   Skin: Negative.   Neurological: Negative.   Psychiatric/Behavioral: Positive for dysphoric mood.    Blood pressure (!)  156/87, pulse (!) 53, temperature 98.3 F (36.8 C), temperature source Oral, resp. rate 18, height 6\' 1"  (1.854 m), weight 107.5 kg, SpO2 98 %.Body mass index is 31.27 kg/m.  General Appearance: Casual  Eye Contact:  Good  Speech:  Clear and Coherent  Volume:  Normal  Mood:  Anxious, Dysphoric and Irritable  Affect:  Congruent  Thought Process:  Disorganized  Orientation:  Full (Time, Place, and Person)  Thought Content:  Logical  Suicidal Thoughts:  Yes.  with intent/plan  Homicidal Thoughts:  No  Memory:  Immediate;   Fair Recent;   Poor Remote;   Poor  Judgement:  Impaired  Insight:  Shallow  Psychomotor Activity:  Restlessness  Concentration:  Concentration: Poor  Recall:  Poor  Fund of Knowledge:  Fair  Language:  Fair  Akathisia:  No  Handed:  Right  AIMS (if indicated):     Assets:  Desire for Improvement Physical Health Resilience  ADL's:  Intact  Cognition:  WNL  Sleep:        Treatment Plan Summary: Plan Patient very dramatically insist that he be given another chance to go to rehab program.  Dismisses my suggestions that he seek out that treatment outside the hospital.  Despite having made statements earlier in the day that he was homicidal towards me he was in no way aggressive or threatening in conversation.  What is clear is that if we discharge him from the emergency room he will almost certainly circle straight back to the front door again.  I will talk with TTS and see if there is any way we can refer him to any sort of rehab program.  He is now stating that he would agree to go to the alcohol and drug abuse treatment program or anything else we have to offer.  Disposition: Supportive therapy provided about ongoing stressors.  Alethia Berthold, MD 04/27/2020 3:02 PM

## 2020-04-27 NOTE — ED Notes (Signed)
Report to include Situation, Background, Assessment, and Recommendations received from [facility]. Patient alert and oriented, warm and dry, in no acute distress. Patient denies SI, HI, AVH and pain. Patient made aware of Q15 minute rounds and security cameras for their safety. Patient instructed to come to me with needs or concerns.

## 2020-04-27 NOTE — ED Notes (Signed)
Hourly rounding completed at this time, patient currently awake in hallway bed. No complaints, stable, and in no acute distress. Q15 minute rounds and monitoring via Rover and Officer to continue. 

## 2020-04-27 NOTE — ED Notes (Signed)
Hourly rounding completed at this time, patient currently awake in room. No complaints, stable, and in no acute distress. Q15 minute rounds and monitoring via Security Cameras to continue. 

## 2020-04-27 NOTE — ED Notes (Signed)
Hourly rounding completed at this time, patient currently asleep in room. No complaints, stable, and in no acute distress. Q15 minute rounds and monitoring via Security Cameras to continue. 

## 2020-04-27 NOTE — ED Notes (Signed)
Report received from Amy RN including  Situation, Background, Assessment, and Recommendations. Patient alert and oriented, warm and dry, in no acute distress. Patient denies AVH and pain. Patient made aware of Q15 minute rounds and security cameras for their safety. Patient instructed to come to this nurse with needs or concerns.

## 2020-04-28 NOTE — ED Notes (Signed)
Hourly rounding completed at this time, patient currently asleep in room. No complaints, stable, and in no acute distress. Q15 minute rounds and monitoring via Security Cameras to continue. 

## 2020-04-28 NOTE — ED Notes (Addendum)
Pt asleep at this time, unable to collect vitals. Will collect pt vitals once awake. 

## 2020-04-28 NOTE — BH Assessment (Addendum)
Referral Check:  [facility] [phone] or [phone]) Left a voicemail   .[facility] [phone]) No answer   .[facility] [phone]) [place] request for re-fax; task completed at 6pm  .[facility] [phone]-or- [phone]) No answer   .[facility] [phone] -or- [phone]), Tobi Bastos reports pt denied due to insurance, offered self pay option

## 2020-04-28 NOTE — ED Notes (Signed)
Pt stating  "I took that seroquel last night and it has really knocked me out today - I don't want that medicine tonight - I take 300mg  at night at home but it really messed me up"

## 2020-04-28 NOTE — ED Notes (Signed)
Obtained pt vs, pt cooperative

## 2020-04-28 NOTE — ED Notes (Signed)

## 2020-04-28 NOTE — ED Notes (Signed)
Patient complaining he was not given a spoon during snack time and questioning why he could not be given a spoon in the BHU but given one in the BMU.  This Clinical research associate tried to explain why he can't have one on unit. Patient became upset and stated, "I don't like your answer, don't talk to me."   This writer advised patient he needs to take his snack tray to his room. No food is supposed to be in the day room.

## 2020-04-28 NOTE — BH Assessment (Signed)
Referral check:  [facility] [phone] or [phone]) Samuel Molina advised TTS to call back in the morning as they do have beds available.   Samuel Molina [phone]) Per Samuel Molina pt was denied due to needing long term substance abuse treatment.  Samuel Molina [phone]) Samuel Molina requested a refax. Task completed: 20:57.  Samuel Molina [phone]) No answer  .Samuel Molina [phone] -or- [phone]), Referrals still being reviewed.

## 2020-04-28 NOTE — ED Notes (Signed)
Pt sleeping at this time. Pts dinner meal tray and drink placed at bedside.

## 2020-04-28 NOTE — BH Assessment (Signed)
Referral information for Psychiatric Hospitalization faxed to:  [facility] [phone] or [phone])  .[facility] [phone])  .[facility] [phone])  .[facility] [phone])  .[facility] [phone] -or- [phone]),

## 2020-04-28 NOTE — ED Notes (Signed)
He is sleeping  VS to be obtained when he awakens  

## 2020-04-29 DIAGNOSIS — F332 Major depressive disorder, recurrent severe without psychotic features: Secondary | ICD-10-CM | POA: Diagnosis not present

## 2020-04-29 MED ORDER — IBUPROFEN 600 MG PO TABS
600.0000 mg | ORAL_TABLET | Freq: Once | ORAL | Status: AC
  Administered 2020-04-29: 600 mg via ORAL
  Filled 2020-04-29: qty 1

## 2020-04-29 MED ORDER — QUETIAPINE FUMARATE 200 MG PO TABS
200.0000 mg | ORAL_TABLET | Freq: Every day | ORAL | Status: DC
  Administered 2020-04-29 – 2020-05-03 (×5): 200 mg via ORAL
  Filled 2020-04-29 (×4): qty 1

## 2020-04-29 NOTE — ED Notes (Signed)
Resumed care from collyn rn.  Pt sleeping in room on the bed.  meds on hold

## 2020-04-29 NOTE — ED Notes (Signed)
Pt in dayroom pacing around.

## 2020-04-29 NOTE — ED Notes (Signed)
No v-signs at this time patient sleeping 

## 2020-04-29 NOTE — BH Assessment (Addendum)
Referral check:  [facility] [phone] or [phone]) No answer  .[facility] [phone]) Per Belenda Cruise pt was denied due to needing long term substance abuse treatment.  [facility] [phone]) Alecia Lem[facility] reports no beds at this time due to Covid outbreak status  .[facility] [phone]) [place] reports no adult male beds available but check back tomm 04/30/20 after 1:00pm.  .[facility] [phone] -or- [phone]),Patient would be self-pay

## 2020-04-29 NOTE — ED Notes (Signed)
Pt in room

## 2020-04-29 NOTE — ED Notes (Signed)
Patient refused shower at this time ,patient states his hip hurts to bad at this time

## 2020-04-29 NOTE — BH Assessment (Signed)
Writer attempted to wake patient for reassessment but patient could not be aroused to complete interview.

## 2020-04-29 NOTE — Progress Notes (Signed)
[facility] MD Progress Note  04/29/2020 3:19 PM Samuel Molina  MRN:  [id number] Subjective: Follow-up for this 60 year old man with substance abuse personality disorder mood symptoms.  Patient has not yet been accepted into any kind of substance abuse rehab.  He states today that he continues to feel motivated to go to a rehab program.  He is complaining that he feels sedated during the day and wants to decrease the dose of Seroquel that he is taking.  Not reporting any active suicidal ideation although he will continue to allude to it especially in the context of any discharge plan. Principal Problem: Severe recurrent major depression without psychotic features ([place]) Diagnosis: Principal Problem:   Severe recurrent major depression without psychotic features ([place]) Active Problems:   Major depression   Cocaine abuse ([place])   Substance induced mood disorder ([place])   Personality disorder (H[facility])   Bell's palsy  Total Time spent with patient: 30 minutes  Past Psychiatric History: Past history of cocaine abuse and personality disorder  Past Medical History:  Past Medical History:  Diagnosis Date  . Anxiety   . Depression   . GERD (gastroesophageal reflux disease)   . Obesity   . Stroke ([place])   . Substance abuse [facility])     Past Surgical History:  Procedure Laterality Date  . ESOPHAGOGASTRODUODENOSCOPY (EGD) WITH PROPOFOL N/A 09/20/2017   Procedure: ESOPHAGOGASTRODUODENOSCOPY (EGD) WITH PROPOFOL;  Surgeon: Lin Landsman, MD;  Location: [place];  Service: Gastroenterology;  Laterality: N/A;  . KNEE SURGERY Right    Family History: No family history on file. Family Psychiatric  History: See previous Social History:  Social History   Substance and Sexual Activity  Alcohol Use Not Currently     Social History   Substance and Sexual Activity  Drug Use Yes  . Types: Cocaine    Social History   Socioeconomic History  . Marital status: Single    Spouse name: Not on file  .  Number of children: Not on file  . Years of education: Not on file  . Highest education level: Not on file  Occupational History  . Not on file  Tobacco Use  . Smoking status: Current Every Day Smoker    Packs/day: 0.50    Types: Cigarettes  . Smokeless tobacco: Never Used  Vaping Use  . Vaping Use: Never used  Substance and Sexual Activity  . Alcohol use: Not Currently  . Drug use: Yes    Types: Cocaine  . Sexual activity: Not on file  Other Topics Concern  . Not on file  Social History Narrative  . Not on file   Social Determinants of Health   Financial Resource Strain: Not on file  Food Insecurity: Not on file  Transportation Needs: Not on file  Physical Activity: Not on file  Stress: Not on file  Social Connections: Not on file   Additional Social History:                         Sleep: Fair  Appetite:  Fair  Current Medications: Current Facility-Administered Medications  Medication Dose Route Frequency Provider Last Rate Last Admin  . aspirin EC tablet 81 mg  81 mg Oral Daily Carrie Mew, MD   81 mg at 04/29/20 1131  . atorvastatin (LIPITOR) tablet 80 mg  80 mg Oral QPM Carrie Mew, MD   80 mg at 04/28/20 1838  . citalopram (CELEXA) tablet 10 mg  10 mg Oral Daily [place],  Aneta Mins, MD   10 mg at 04/29/20 1132  . gabapentin (NEURONTIN) capsule 400 mg  400 mg Oral TID Sharman Cheek, MD   400 mg at 04/29/20 1142  . lisinopril (ZESTRIL) tablet 5 mg  5 mg Oral Daily Sharman Cheek, MD   5 mg at 04/29/20 1132  . pantoprazole (PROTONIX) EC tablet 40 mg  40 mg Oral Daily Sharman Cheek, MD   40 mg at 04/29/20 1130  . QUEtiapine (SEROQUEL) tablet 200 mg  200 mg Oral QHS Akaiya Touchette, Jackquline Denmark, MD       Current Outpatient Medications  Medication Sig Dispense Refill  . aspirin EC 81 MG tablet Take 81 mg by mouth daily. Swallow whole.    Marland Kitchen atorvastatin (LIPITOR) 80 MG tablet Take 80 mg by mouth every evening.    . citalopram (CELEXA) 10 MG tablet  Take 1 tablet (10 mg total) by mouth daily. 30 tablet 0  . gabapentin (NEURONTIN) 400 MG capsule Take 400 mg by mouth 3 (three) times daily.    Marland Kitchen lisinopril (ZESTRIL) 5 MG tablet Take 5 mg by mouth daily.    . pantoprazole (PROTONIX) 40 MG tablet Take 2 tablets (80 mg total) by mouth daily. (Patient taking differently: Take 40 mg by mouth daily.) 60 tablet 1  . QUEtiapine (SEROQUEL) 25 MG tablet Take 1 tablet (25 mg total) by mouth 2 (two) times daily. (Patient not taking: Reported on 04/26/2020) 60 tablet 0  . QUEtiapine (SEROQUEL) 300 MG tablet Take 1 tablet (300 mg total) by mouth at bedtime. (Patient not taking: Reported on 04/26/2020) 30 tablet 0  . tiZANidine (ZANAFLEX) 2 MG tablet Take 2 mg by mouth every 8 (eight) hours as needed for muscle spasms.      Lab Results: No results found for this or any previous visit (from the past 48 hour(s)).  Blood Alcohol level:  Lab Results  Component Value Date   ETH <10 04/27/2020   ETH <10 04/26/2020    Metabolic Disorder Labs: Lab Results  Component Value Date   HGBA1C 5.7 (H) 02/16/2020   MPG 117 02/16/2020   No results found for: PROLACTIN Lab Results  Component Value Date   CHOL 171 02/16/2020   TRIG 330 (H) 02/16/2020   HDL 36 (L) 02/16/2020   CHOLHDL 4.8 02/16/2020   VLDL 66 (H) 02/16/2020   LDLCALC 69 02/16/2020    Physical Findings: AIMS:  , ,  ,  ,    CIWA:    COWS:     Musculoskeletal: Strength & Muscle Tone: within normal limits Gait & Station: normal Patient leans: N/A  Psychiatric Specialty Exam: Physical Exam Vitals and nursing note reviewed.  Constitutional:      Appearance: He is well-developed and well-nourished.  HENT:     Head: Normocephalic and atraumatic.  Eyes:     Conjunctiva/sclera: Conjunctivae normal.     Pupils: Pupils are equal, round, and reactive to light.  Cardiovascular:     Heart sounds: Normal heart sounds.  Pulmonary:     Effort: Pulmonary effort is normal.  Abdominal:      Palpations: Abdomen is soft.  Musculoskeletal:        General: Normal range of motion.     Cervical back: Normal range of motion.  Skin:    General: Skin is warm and dry.  Neurological:     General: No focal deficit present.     Mental Status: He is alert.  Psychiatric:        Attention  and Perception: Attention normal.        Mood and Affect: Mood is anxious.        Speech: Speech normal.        Behavior: Behavior normal.        Thought Content: Thought content normal.        Cognition and Memory: Cognition normal.        Judgment: Judgment normal.     Review of Systems  Constitutional: Positive for fatigue.  HENT: Negative.   Eyes: Negative.   Respiratory: Negative.   Cardiovascular: Negative.   Gastrointestinal: Negative.   Musculoskeletal: Negative.   Skin: Negative.   Neurological: Negative.   Psychiatric/Behavioral: Negative.     Blood pressure 115/82, pulse 78, temperature 98 F (36.7 C), temperature source Oral, resp. rate 18, height 6\' 1"  (1.854 m), weight 107.5 kg, SpO2 98 %.Body mass index is 31.27 kg/m.  General Appearance: Casual  Eye Contact:  Fair  Speech:  Slow  Volume:  Decreased  Mood:  Dysphoric  Affect:  Congruent  Thought Process:  Coherent  Orientation:  Full (Time, Place, and Person)  Thought Content:  Logical  Suicidal Thoughts:  No  Homicidal Thoughts:  No  Memory:  Immediate;   Fair Recent;   Fair Remote;   Fair  Judgement:  Fair  Insight:  Fair  Psychomotor Activity:  Normal  Concentration:  Concentration: Fair  Recall:  [employer] of Knowledge:  Fair  Language:  Fair  Akathisia:  No  Handed:  Right  AIMS (if indicated):     Assets:  Desire for Improvement  ADL's:  Impaired  Cognition:  Impaired,  Mild  Sleep:        Treatment Plan Summary: Plan Continue working on referral to substance abuse facilities.  Decrease dose of Seroquel at night to 200 and stop daytime dose.  Psychoeducation and encouragement done with the  patient.  Case reviewed with treatment team.  Alethia Berthold, MD 04/29/2020, 3:19 PM

## 2020-04-29 NOTE — ED Notes (Signed)
Pt eating dinner tray again.  Pt stated he told security that hair was in his first dinner tray and asked for 2nd tray and threw away the first one.  Pt became demanding and angry about his food and medicine schedule.

## 2020-04-29 NOTE — ED Notes (Signed)
Pt on phone now.

## 2020-04-29 NOTE — ED Notes (Signed)
Ginger ale provided per pt request.  

## 2020-04-29 NOTE — ED Provider Notes (Signed)
Emergency Medicine Observation Re-evaluation Note  Samuel Molina is a 60 y.o. male, seen on rounds today.  Pt initially presented to the ED for complaints of Suicidal  Currently, the patient is is no acute distress. Denies any concerns at this time.  Patient states that he just has some dry mouth and is requesting some ginger ale.  Physical Exam  Blood pressure 124/70, pulse 68, temperature 97.6 F (36.4 C), temperature source Oral, resp. rate 18, height 6\' 1"  (1.854 m), weight 107.5 kg, SpO2 98 %.  Physical Exam General: No apparent distress HEENT: moist mucous membranes CV: RRR Pulm: Normal WOB GI: soft and non tender MSK: no edema or cyanosis Neuro: face symmetric, moving all extremities     ED Course / MDM     I have reviewed the labs performed to date as well as medications administered while in observation.  Recent changes in the last 24 hours include   Plan   Current plan is to continue to wait for psych plan/placement if felt warranted  Patient is not under full IVC at this time.   , MD 04/29/20 [phone]

## 2020-04-29 NOTE — ED Notes (Signed)

## 2020-04-29 NOTE — ED Notes (Signed)
This RN had a discussion with pt on getting emotional support to deal with the loss of his wife. Pt states he knows suicide isn't the answer, however, "in the moment" feels like the only way. Pt denies si/hi at this time. Appreciative of conversation. Pt wanted me to check with Dr on getting transferred downstairs, Dr Toni Amend states that isn't happening at this time.

## 2020-04-30 NOTE — BH Assessment (Signed)
Referral information for Substance Abuse Treatment faxed to;   . [facility] ([phone]) Meg reports that beds are available for treatment and to contact back after 7:30am-8am

## 2020-04-30 NOTE — ED Notes (Signed)
Unable to obtain vitals due to patient sleeping. Will continue to monitor.   

## 2020-04-30 NOTE — BH Assessment (Signed)
This Probation officer reassessed patient and discussed with patient his motivation for Substance Abuse treatment, patient endorsed to Probation officer that he continues to be depressed "I feel hopeless and worthless." He reports that he recently had a stroke and attempted to take all his medications to hurt himself. Patient continues to report SI with a plan.  Communicated this to EDP Dr. Archie Balboa who agrees for patient to receive Tele-psych assessment

## 2020-04-30 NOTE — ED Notes (Signed)
He is sleeping  VS to be obtained when he awakens  

## 2020-04-30 NOTE — ED Notes (Signed)
VOL  PENDING  PLACEMENT 

## 2020-04-30 NOTE — Progress Notes (Signed)
CSW reached out to [facility] [phone]) and spoke with Hoyle Sauer. She stated that there is no availability for detox. She reports there is a 4 week wait list for the substance abuse treatment program. Pt can call the number listed above to do a phone screening in order to be placed on the wait list.     Freddi Che, [place] Social Worker - Disposition  [phone]

## 2020-04-30 NOTE — BH Assessment (Signed)
Referral Check:   [facility] ([phone]) Writer contacted facility to confirm if patient was on the wait list for Substance Abuse Treatment, writer talked with detox nurse Meg who was unaware if patient is on the wait list, Meg reports that that information comes from the counselors who are not available until Monday morning.

## 2020-05-01 LAB — SARS CORONAVIRUS 2 (TAT 6-24 HRS): SARS Coronavirus 2: NEGATIVE

## 2020-05-01 MED ORDER — IBUPROFEN 600 MG PO TABS
600.0000 mg | ORAL_TABLET | Freq: Once | ORAL | Status: AC
  Administered 2020-05-01: 600 mg via ORAL
  Filled 2020-05-01: qty 1

## 2020-05-01 NOTE — ED Provider Notes (Signed)
Emergency Medicine Observation Re-evaluation Note  Samuel Molina is a 60 y.o. male, seen on rounds today.  Pt initially presented to the ED for complaints of Suicidal Currently, the patient is calm, resting.  Physical Exam  BP 138/71 (BP Location: Left Arm)   Pulse 64   Temp 98 F (36.7 C) (Oral)   Resp 18   Ht 6\' 1"  (1.854 m)   Wt 107.5 kg   SpO2 98%   BMI 31.27 kg/m  Physical Exam General: NAD Cardiac: Well perfused Lungs: Normal WOB Psych: Calm  ED Course / MDM  EKG:    I have reviewed the labs performed to date as well as medications administered while in observation.  Recent changes in the last 24 hours include none.  Plan  Current plan is for psych disposition. Patient is not under full IVC at this time.   Duffy Bruce, MD 05/01/20 1312

## 2020-05-01 NOTE — ED Notes (Signed)
Pt sleeping, will get vitals when he wakes up

## 2020-05-02 NOTE — BH Assessment (Addendum)
Patient reported to this Probation officer that he has a potential bed available at [facility] located in [place], [place] on Tuesday (05/04/20) and was inquiring if transportation would be able to get him there. Writer asked ED Secretary Vaughan Basta who reports that [facility] should be able to transport patient there. TTS to follow up with patient.

## 2020-05-02 NOTE — ED Provider Notes (Signed)
Emergency Medicine Observation Re-evaluation Note  Janis Cuffe is a 60 y.o. male, seen on rounds today.  Pt initially presented to the ED for complaints of Suicidal Currently, the patient is resting.  Physical Exam  BP (!) 156/74 (BP Location: Left Arm)   Pulse 65   Temp 98.3 F (36.8 C) (Oral)   Resp 18   Ht 6\' 1"  (1.854 m)   Wt 107.5 kg   SpO2 100%   BMI 31.27 kg/m  Physical Exam General: nontoxic Cardiac: well perfused Lungs: unlabored Psych: resting, calm  ED Course / MDM  EKG:    I have reviewed the labs performed to date as well as medications administered while in observation.  Recent changes in the last 24 hours include none.  Plan  Current plan is for psych dispo. Patient is under full IVC at this time.   Merlyn Lot, MD 05/02/20 [phone]

## 2020-05-02 NOTE — ED Notes (Signed)
Pt requested a [place] tray.

## 2020-05-02 NOTE — ED Provider Notes (Signed)
-----------------------------------------   4:11 AM on 05/02/2020 -----------------------------------------  Patient was evaluated by [facility] psychiatrist Dr. Percell Miller who recommends continuing IVC and inpatient admission.   Paulette Blanch, MD 05/02/20 [phone]

## 2020-05-02 NOTE — ED Notes (Signed)
Charge nurse spoke with pt and offered pt to move to the quad hallway but he declined. He reported he will stay in his room and ignore peer. Peer educated to not speak with him and to leave him alone.

## 2020-05-02 NOTE — ED Notes (Signed)
Patient sleeping at this time.  Vital signs deferred.

## 2020-05-02 NOTE — ED Notes (Signed)
Pt is threatening to punch another pt out. He is demanding to be moved downstairs because the other pt is loud and intrusive. Peer is talking to anyone who will listen but is not aggressive.

## 2020-05-02 NOTE — ED Notes (Signed)
Patient speaking with [facility] 

## 2020-05-02 NOTE — ED Notes (Signed)
[facility] complete/pending inpatient psych placement.

## 2020-05-02 NOTE — BH Assessment (Addendum)
TTS faxed [facility] referral to [employer] requesting authorization code.   TTS unable to receive code today due to the office being closed

## 2020-05-02 NOTE — ED Notes (Signed)
Gave patient chocolate ice cream, crackers and a ginger ale.

## 2020-05-02 NOTE — ED Notes (Signed)
Patient is vol pending psych admit

## 2020-05-03 NOTE — ED Provider Notes (Signed)
Emergency Medicine Observation Re-evaluation Note  Samuel Molina is a 60 y.o. male, seen on rounds today.  Pt initially presented to the ED for complaints of Suicidal Currently, the patient is resting.  Physical Exam  BP 138/73 (BP Location: Left Arm)   Pulse (!) 119   Temp 98.2 F (36.8 C) (Oral)   Resp 18   Ht 6\' 1"  (1.854 m)   Wt 107.5 kg   SpO2 98%   BMI 31.27 kg/m  Physical Exam Constitutional:      Appearance: He is not ill-appearing or toxic-appearing.  HENT:     Head: Atraumatic.  Eyes:     Extraocular Movements: Extraocular movements intact.     Pupils: Pupils are equal, round, and reactive to light.  Pulmonary:     Effort: Pulmonary effort is normal.  Abdominal:     General: There is no distension.  Musculoskeletal:        General: No deformity.  Skin:    General: Skin is warm and dry.  Neurological:     General: No focal deficit present.     Cranial Nerves: No cranial nerve deficit.      ED Course / MDM  EKG:    I have reviewed the labs performed to date as well as medications administered while in observation.  Recent changes in the last 24 hours include none.  Plan  Current plan is for inpatient psychiatric care. Patient is not under full IVC at this time.   Vladimir Crofts, MD 05/03/20 [phone]

## 2020-05-03 NOTE — ED Notes (Signed)
Hourly rounding reveals patient in room. No complaints, stable, in no acute distress. Q15 minute rounds and monitoring via Security Cameras to continue. 

## 2020-05-03 NOTE — BH Assessment (Signed)
Writer spoke with Mendel Ryder at [facility] [phone] to verify patient's admission. Mendel Ryder reports patient is expected to arrive between 3:00-5:00pm tomm (Tues) 05/04/20. She also provided the director's contact information: Hilbert Bible [phone].

## 2020-05-03 NOTE — ED Notes (Signed)
Report to include Situation, Background, Assessment, and Recommendations received from Amy RN. Patient alert and oriented, warm and dry, in no acute distress. Patient denies SI, HI, AVH and pain. Patient made aware of Q15 minute rounds and security cameras for their safety. Patient instructed to come to me with needs or concerns.

## 2020-05-03 NOTE — ED Notes (Signed)
Snack and beverage given. 

## 2020-05-03 NOTE — ED Notes (Signed)
Unable to obtain vitals due to patient sleeping. Will continue to monitor.   

## 2020-05-03 NOTE — ED Notes (Signed)
Pt informed he would be transferred to [facility] tomorrow afternoon.  Pt accepting.

## 2020-05-04 MED ORDER — METHOCARBAMOL 500 MG PO TABS
500.0000 mg | ORAL_TABLET | Freq: Three times a day (TID) | ORAL | Status: DC | PRN
  Filled 2020-05-04: qty 1

## 2020-05-04 MED ORDER — NAPROXEN 500 MG PO TABS
500.0000 mg | ORAL_TABLET | Freq: Two times a day (BID) | ORAL | Status: DC | PRN
  Filled 2020-05-04: qty 1

## 2020-05-04 NOTE — ED Notes (Signed)
Spoke with Legrand Como and he requested [facility], printed and sent with pt

## 2020-05-04 NOTE — ED Notes (Signed)
Hourly rounding reveals patient in room. No complaints, stable, in no acute distress. Q15 minute rounds and monitoring via Security Cameras to continue. 

## 2020-05-04 NOTE — ED Notes (Signed)
VS not taken, patient asleep 

## 2020-05-04 NOTE — ED Provider Notes (Signed)
Patient is here voluntarily with plan for outpatient follow-up. He has a plan for self transport to [facility] where he has been accepted and reports been given. Discharged in stable condition.   Lucrezia Starch, MD 05/04/20 1435

## 2020-05-04 NOTE — ED Notes (Signed)
Spoke with Corene Cornea at [facility] [place] and he confirms pt is expected there. Per Corene Cornea pt to just get himself there for substance abuse treatment.

## 2020-05-04 NOTE — ED Notes (Signed)
Pt going to [facility] [place] via [facility]. Discharge teaching done and pt verbalized understanding. Pt signed discharge paper form. Given all his personal belongings. Escorted out by NT, A&O x3, ambulatory with steady gait and in NAD.

## 2020-05-04 NOTE — ED Provider Notes (Deleted)
Emergency Medicine Observation Re-evaluation Note  Samuel Molina is a 60 y.o. male, seen on rounds today.  Pt initially presented to the ED for complaints of Suicidal Currently, the patient is calm.  Physical Exam  BP (!) 156/94   Pulse 71   Temp 98 F (36.7 C) (Oral)   Resp 18   Ht 6\' 1"  (1.854 m)   Wt 107.5 kg   SpO2 96%   BMI 31.27 kg/m  Physical Exam General: no acute distress Cardiac: normal rate Lungs: no acute distress Psych: calm  ED Course / MDM  EKG:    I have reviewed the labs performed to date as well as medications administered while in observation.  Recent changes in the last 24 hours include none.  Plan  Current plan is for inpatient psych admission . Patient is not under full IVC at this time.   Lucrezia Starch, MD 05/04/20 1350

## 2020-05-05 ENCOUNTER — Other Ambulatory Visit: Payer: Self-pay

## 2020-05-05 ENCOUNTER — Emergency Department (HOSPITAL_COMMUNITY): Payer: Self-pay

## 2020-05-05 ENCOUNTER — Emergency Department (HOSPITAL_COMMUNITY)
Admission: EM | Admit: 2020-05-05 | Discharge: 2020-05-05 | Disposition: A | Discharge status: Home / Self Care | Payer: Self-pay | Attending: Emergency Medicine | Admitting: Emergency Medicine

## 2020-05-05 ENCOUNTER — Encounter (HOSPITAL_COMMUNITY): Payer: Self-pay | Admitting: *Deleted

## 2020-05-05 DIAGNOSIS — F17210 Nicotine dependence, cigarettes, uncomplicated: Secondary | ICD-10-CM | POA: Insufficient documentation

## 2020-05-05 DIAGNOSIS — Z7982 Long term (current) use of aspirin: Secondary | ICD-10-CM | POA: Insufficient documentation

## 2020-05-05 DIAGNOSIS — R45851 Suicidal ideations: Secondary | ICD-10-CM | POA: Insufficient documentation

## 2020-05-05 DIAGNOSIS — F333 Major depressive disorder, recurrent, severe with psychotic symptoms: Secondary | ICD-10-CM | POA: Insufficient documentation

## 2020-05-05 LAB — RAPID URINE DRUG SCREEN, HOSP PERFORMED
Amphetamines: NOT DETECTED
Barbiturates: NOT DETECTED
Benzodiazepines: NOT DETECTED
Cocaine: NOT DETECTED
Opiates: NOT DETECTED
Tetrahydrocannabinol: NOT DETECTED

## 2020-05-05 LAB — CBC
HCT: 47.7 % (ref 39.0–52.0)
Hemoglobin: 15.2 g/dL (ref 13.0–17.0)
MCH: 26.2 pg (ref 26.0–34.0)
MCHC: 31.9 g/dL (ref 30.0–36.0)
MCV: 82.1 fL (ref 80.0–100.0)
Platelets: 277 10*3/uL (ref 150–400)
RBC: 5.81 MIL/uL (ref 4.22–5.81)
RDW: 15.2 % (ref 11.5–15.5)
WBC: 8.7 10*3/uL (ref 4.0–10.5)
nRBC: 0 % (ref 0.0–0.2)

## 2020-05-05 LAB — TROPONIN I (HIGH SENSITIVITY): Troponin I (High Sensitivity): 2 ng/L (ref <18)

## 2020-05-05 LAB — COMPREHENSIVE METABOLIC PANEL
ALT: 21 U/L (ref 0–44)
AST: 21 U/L (ref 15–41)
Albumin: 4.4 g/dL (ref 3.5–5.0)
Alkaline Phosphatase: 94 U/L (ref 38–126)
Anion gap: 10 (ref 5–15)
BUN: 15 mg/dL (ref 6–20)
CO2: 26 mmol/L (ref 22–32)
Calcium: 9.6 mg/dL (ref 8.9–10.3)
Chloride: 100 mmol/L (ref 98–111)
Creatinine, Ser: 1.01 mg/dL (ref 0.61–1.24)
GFR, Estimated: >60 mL/min (ref >60)
Glucose, Bld: 94 mg/dL (ref 70–99)
Potassium: 4.5 mmol/L (ref 3.5–5.1)
Sodium: 136 mmol/L (ref 135–145)
Total Bilirubin: 0.6 mg/dL (ref 0.3–1.2)
Total Protein: 8.3 g/dL — ABNORMAL HIGH (ref 6.5–8.1)

## 2020-05-05 LAB — ACETAMINOPHEN LEVEL: Acetaminophen (Tylenol), Serum: <10 ug/mL — ABNORMAL LOW (ref 10–30)

## 2020-05-05 LAB — SALICYLATE LEVEL: Salicylate Lvl: <7 mg/dL — ABNORMAL LOW (ref 7.0–30.0)

## 2020-05-05 LAB — ETHANOL: Alcohol, Ethyl (B): <10 mg/dL (ref <10)

## 2020-05-05 MED ORDER — ALUM & MAG HYDROXIDE-SIMETH 200-200-20 MG/5ML PO SUSP
30.0000 mL | Freq: Once | ORAL | Status: AC
  Administered 2020-05-05: 30 mL via ORAL
  Filled 2020-05-05: qty 30

## 2020-05-05 MED ORDER — LORAZEPAM 1 MG PO TABS
2.0000 mg | ORAL_TABLET | Freq: Once | ORAL | Status: AC
  Administered 2020-05-05: 2 mg via ORAL
  Filled 2020-05-05: qty 2

## 2020-05-05 MED ORDER — KETOROLAC TROMETHAMINE 60 MG/2ML IM SOLN
60.0000 mg | Freq: Once | INTRAMUSCULAR | Status: AC
  Administered 2020-05-05: 60 mg via INTRAMUSCULAR
  Filled 2020-05-05: qty 2

## 2020-05-05 NOTE — ED Notes (Signed)
[facility] arranged for pt, but pt no longer visualized outside of ED lobby.  Safe transport made aware.

## 2020-05-05 NOTE — ED Triage Notes (Signed)
Pt presents with depression and suicidal thoughts, states he plans to walk in traffic. He has been having these thoughts for about 2 weeks. States he is hearing voices, will not reveal if they are telling him to hurt self or not. Pt seen on 1/4 for taking additional home medication in order to harm self.

## 2020-05-05 NOTE — ED Notes (Signed)
Pt ambulatory to [facility] for TTS consultation.

## 2020-05-05 NOTE — ED Notes (Signed)
Pt c/o left side shoulder pain that is radiating down left side.

## 2020-05-05 NOTE — ED Notes (Signed)
Pt dressed out in purple scrubs.

## 2020-05-05 NOTE — ED Provider Notes (Addendum)
[facility] DEPT Provider Note   CSN: [id number] Arrival date & time: 05/05/20  [id number]     History Chief Complaint  Patient presents with  . Suicidal  . Depression    Samuel Molina is a 60 y.o. male with PMH significant for major depressive disorder, suicidal ideation, CVA, and substance use disorder who presents to the ED with complaints of suicidal ideation and specific plan to walk out in traffic.  I reviewed patient's medical record and he was just admitted at [facility] from 04/26/2020 to 05/04/2020 for cocaine use and suicidal attempt by overdosing on prescription medications including his gabapentin, atorvastatin, pantoprazole, lisinopril, citalopram, and tizanidine.  Behavioral health recommended inpatient stabilization.  On 05/03/2020, a counselor had spoken with Mendel Ryder at [facility] [phone] who accepted patient for admission on 05/04/2020 between 3-5 PM.  Yesterday at approximately 4:30 PM there was a note signed by Marden Noble RN that he was being transferred from [facility] to [place] via safe transport.    On my examination, patient tells me that he left since he arrived to [facility] because this depression and symptoms are not related to substance use and he feels as though he needs psychiatric stabilization.  He states that he simply does not want to live anymore.  He lost his wife approximately 6 months ago and states that he has been experiencing major depression ever since.  He also hears voices, but they are not particularly angry.  He is not familiar with the voices and does not know who is talking to him.  He is sullen on exam and expresses anhedonia.  He also reports that beginning last evening he has been experiencing some left-sided chest discomfort, left shoulder, and left neck pain.  He denies any diaphoresis, nausea or emesis, cough or shortness of breath, room spinning dizziness, or new numbness/weakness.  He  has residual left-sided deficits from an ischemic stroke a couple months ago.    HPI     Past Medical History:  Diagnosis Date  . Anxiety   . Depression   . GERD (gastroesophageal reflux disease)   . Obesity   . Stroke ([place])   . Substance abuse [facility])     Patient Active Problem List   Diagnosis Date Noted  . Bell's palsy 04/26/2020  . Malingering 03/25/2020  . Personality disorder ([place]) 02/13/2020  . Bursitis 02/13/2020  . Substance induced mood disorder ([place]) 02/12/2020  . Severe recurrent major depression without psychotic features ([place]) 01/21/2020  . Cocaine abuse ([place]) 01/21/2020  . Hip pain 01/21/2020  . Major depression 01/20/2020  . Gastroesophageal reflux disease with esophagitis   . Morbid obesity ([place]) 09/19/2017  . Arthritis of knee 11/03/2015    Past Surgical History:  Procedure Laterality Date  . ESOPHAGOGASTRODUODENOSCOPY (EGD) WITH PROPOFOL N/A 09/20/2017   Procedure: ESOPHAGOGASTRODUODENOSCOPY (EGD) WITH PROPOFOL;  Surgeon: Lin Landsman, MD;  Location: [facility];  Service: Gastroenterology;  Laterality: N/A;  . KNEE SURGERY Right        History reviewed. No pertinent family history.  Social History   Tobacco Use  . Smoking status: Current Every Day Smoker    Packs/day: 0.50    Types: Cigarettes  . Smokeless tobacco: Never Used  Vaping Use  . Vaping Use: Never used  Substance Use Topics  . Alcohol use: Not Currently  . Drug use: Yes    Types: Cocaine    Home Medications Prior to Admission medications   Medication  Sig Start Date End Date Taking? Authorizing Provider  aspirin EC 81 MG tablet Take 81 mg by mouth daily. Swallow whole.   Yes [provider]  atorvastatin (LIPITOR) 80 MG tablet Take 80 mg by mouth every evening.   Yes [provider]  citalopram (CELEXA) 10 MG tablet Take 1 tablet (10 mg total) by mouth daily. Patient taking differently: Take 5 mg by mouth daily. 03/30/20  Yes Rankin, Shuvon B, NP   gabapentin (NEURONTIN) 400 MG capsule Take 400 mg by mouth 3 (three) times daily.   Yes [provider]  lisinopril (ZESTRIL) 5 MG tablet Take 5 mg by mouth daily.   Yes [provider]  pantoprazole (PROTONIX) 40 MG tablet Take 2 tablets (80 mg total) by mouth daily. Patient taking differently: Take 40 mg by mouth daily. 02/04/20  Yes Clapacs, [name], MD  tiZANidine (ZANAFLEX) 2 MG tablet Take 2 mg by mouth every 8 (eight) hours as needed for muscle spasms.   Yes [provider]  QUEtiapine (SEROQUEL) 25 MG tablet Take 1 tablet (25 mg total) by mouth 2 (two) times daily. Patient not taking: No sig reported 03/30/20   Rankin, Shuvon B, NP  QUEtiapine (SEROQUEL) 300 MG tablet Take 1 tablet (300 mg total) by mouth at bedtime. Patient not taking: No sig reported 03/30/20   Derrill Center, NP  omeprazole (PRILOSEC OTC) 20 MG tablet Take 2 tablets (40 mg total) by mouth 2 (two) times daily before a meal. 09/19/17 01/29/19  Lin Landsman, MD    Allergies    Acetaminophen  Review of Systems   Review of Systems  All other systems reviewed and are negative.   Physical Exam Updated Vital Signs BP (!) 152/93 (BP Location: Right Arm)   Pulse 62   Temp 98.3 F (36.8 C) (Oral)   Resp 16   Ht 6\' 1"  (1.854 m)   Wt 106.6 kg   SpO2 99%   BMI 31.00 kg/m   Physical Exam Vitals and nursing note reviewed. Exam conducted with a chaperone present.  Constitutional:      General: He is not in acute distress.    Appearance: He is not toxic-appearing.  HENT:     Head: Normocephalic and atraumatic.  Eyes:     General: No scleral icterus.    Conjunctiva/sclera: Conjunctivae normal.  Neck:     Comments: Pain elicited over the left side of neck when turning head and contralateral direction.  No cervical midline tenderness. Cardiovascular:     Rate and Rhythm: Normal rate and regular rhythm.     Pulses: Normal pulses.     Heart sounds: Normal heart sounds.  Pulmonary:      Effort: Pulmonary effort is normal. No respiratory distress.     Breath sounds: Normal breath sounds. No wheezing or rales.     Comments: CTA bilaterally.  No increased work of breathing. Musculoskeletal:     Cervical back: Normal range of motion. No rigidity.  Skin:    General: Skin is dry.     Capillary Refill: Capillary refill takes less than 2 seconds.  Neurological:     Mental Status: He is alert and oriented to person, place, and time.     GCS: GCS eye subscore is 4. GCS verbal subscore is 5. GCS motor subscore is 6.     Cranial Nerves: No cranial nerve deficit.     Sensory: No sensory deficit.     Motor: Weakness present.  Coordination: Coordination normal.     Comments: CN II through XII grossly intact.  Patient does have left-sided weakness in upper and lower extremities (chronic) due to his ischemic stroke.  No new changes per patient.  No sensory deficits.  Psychiatric:        Mood and Affect: Mood normal.        Behavior: Behavior normal.        Thought Content: Thought content normal.     ED Results / Procedures / Treatments   Labs (all labs ordered are listed, but only abnormal results are displayed) Labs Reviewed  COMPREHENSIVE METABOLIC PANEL - Abnormal; Notable for the following components:      Result Value   Total Protein 8.3 (*)    All other components within normal limits  SALICYLATE LEVEL - Abnormal; Notable for the following components:   Salicylate Lvl [id number] (*)    All other components within normal limits  ACETAMINOPHEN LEVEL - Abnormal; Notable for the following components:   Acetaminophen (Tylenol), Serum <10 (*)    All other components within normal limits  ETHANOL  CBC  RAPID URINE DRUG SCREEN, HOSP PERFORMED  TROPONIN I (HIGH SENSITIVITY)    EKG None  Radiology DG Chest Portable 1 View  Result Date: 05/05/2020 CLINICAL DATA:  Chest pain radiating to LEFT shoulder, smoker EXAM: PORTABLE CHEST 1 VIEW COMPARISON:  Portable exam 1103  hours without priors for comparison FINDINGS: Normal heart size, mediastinal contours, and pulmonary vascularity. Lungs clear. No pleural effusion or pneumothorax. Bones unremarkable. IMPRESSION: No acute abnormalities. Electronically Signed   By: Lavonia Dana M.D.   On: 05/05/2020 11:04    Procedures Procedures (including critical care time)  Medications Ordered in ED Medications  ketorolac (TORADOL) injection 60 mg (60 mg Intramuscular Given 05/05/20 1146)  LORazepam (ATIVAN) tablet 2 mg (2 mg Oral Given 05/05/20 1147)  alum & mag hydroxide-simeth (MAALOX/MYLANTA) 200-200-20 MG/5ML suspension 30 mL (30 mLs Oral Given 05/05/20 1147)    ED Course  I have reviewed the triage vital signs and the nursing notes.  Pertinent labs & imaging results that were available during my care of the patient were reviewed by me and considered in my medical decision making (see chart for details).    MDM Rules/Calculators/A&P                          Patient is presented to the ED for depression and suicidal ideation.  He does not want to live anymore, ever since his wife passed away approximately 6 months ago.  He is also complaining of some left-sided shoulder discomfort, but his ROM is intact and with abduction against resistance.  However, his left shoulder pain also is associated with left-sided chest and left-sided neck discomfort.  Given his history of ischemic stroke, tobacco use, obesity, and other risk factors concerning for cardiovascular disease, will obtain one-time troponin and EKG for further assessment.  We will also obtain plain films of the chest in addition to basic laboratory work-up.  Once medically cleared, patient will once again have to consult with TTS.  While he had been discharged him to outpatient substance use program, he is adamant that his suicidal ideation is related to depression rather than substance use.  He has been in a hospital setting for over 2 weeks and continues to feel  depressed, I agree that this is likely not substance-induced.  Labs reviewed and unremarkable.  Troponin is not elevated.  Given  chronicity of his symptoms, do not need to trend.  Remainder of laboratory work-up is also reassuring.  Medically cleared.  Will consult TTS to evaluate patient and determine disposition.  Patient is VOLUNTARY.  I spoke with Aleda Grana, counselor, and Earleen Newport, Psychiatric NP, and this patient has been discussed amongst them and the entire psychiatric team including Dr. Dwyane Dee and he is psychiatric cleared.  They hope that he will consider his current admission at [facility], but if he declines, he is free to follow up at [facility] or the other resources that they have provided him with in the past.  AVS completed by psych team.    ED return precautions discussed.  Patient voices understanding and is agreeable to the plan.  I personally spoke with Corene Cornea at [facility] in [place] and they will await and anticipate his transfer back to their facilities.  Final Clinical Impression(s) / ED Diagnoses Final diagnoses:  Suicidal ideation  Severe episode of recurrent major depressive disorder, with psychotic features [facility])    Rx / DC Orders ED Discharge Orders    None       Corena Herter, PA-C 05/05/20 1141    Corena Herter, PA-C 05/05/20 1555    Corena Herter, PA-C 05/05/20 1603    Lucrezia Starch, MD 05/07/20 1141

## 2020-05-05 NOTE — Discharge Instructions (Addendum)
I spoke with [facility] in [place] and they are anticipating your return!   You are advised to return to [facility].  To help you maintain a sober lifestyle, a substance abuse treatment program may be beneficial to you.  Contact one of the following providers at your earliest opportunity to ask about enrolling in their program:       [facility]      [place], [place] [address]      [phone]      Ask about their Substance Abuse Intensive Outpatient Program.  They also offer psychiatry/medication management and therapy.  New patients are being seen in their walk-in clinic.  Walk-in hours are Monday - Thursday from 8:00 am - 11:00 am for psychiatry, and Friday from 1:00 pm - 4:00 pm for therapy.  Walk-in patients are seen on a first come, first served basis, so try to arrive as early as possible for the best chance of being seen the same day.  Please note that only [facility] residents are eligible for these services.       [facility]      [address]      [place], [place] [address]      [phone]

## 2020-05-05 NOTE — Consult Note (Signed)
Psychiatric Assessment   Samuel Molina 60 y.o. male patient with history of frequent hospital visit with similar complaints of depression related to the death of his wife, and him having a stroke.  Patient threatens suicidal ideation or to hurt himself which is almost identical to previous complaints.  Patient has also admitted to regular cocaine use.  Patient has had multiple psychiatric admissions, referrals to rehab facilities, half-way house, and outpatient psychiatric services.  Once discharged does not follow up with resources or recommendations.  During the time of patient psychiatric admissions, he has refused to engage in therapeutic milieu, inappropriate behavior, and resistant to appropriate follow up plans.  Patient discharged from [facility] yesterday 05/04/20 with no complaints of suicidal/self-harm/homicidal ideation, psychosis, or paranoia.  Patient was transported to [facility] 05/04/20.    Patient back in emergency room today 05/05/20 with endorsing complaints of suicidal ideation and auditory hallucinations that he states has been going on for 2 weeks telling him to hurt himself.    During TTS assessment complains of depression related to stroke and death of his wife.  Stating he did not want to stay at [facility].  When asked about his recent discharge at [facility] patient wanted to know how TTS provider knew about his hospital stay and then stating if he knew we could see information that he would have went somewhere else.  Spoke with Dr. Dwyane Dee familiar with patient going from hospital to hospital with common complaint of suicidal ideation but doesn't follow up with psychiatric services, rehab services and non-compliant with medication regimen.  Feels patient is manipulative and malingering for secondary gain.     Patient has history ongoing endorsement of suicidal ideation shows clear evidence of secondary gain of unmet needs for housing, that is representative of limited and  often- maladaptive coping skills and rather than an indicator of imminent risk of death.   Evidence indicates that subsequent suicide attempts by patients who made contingent suicide threats (defined as threatened suicide or exaggerated suicidality) are uncommon in both groups.  Hospitalization should not be used as a substitute for social services, substance abuse treatment, and legal assistance for patients who make contingent suicide threats (Characteristics and six-month outcome of patients who use suicide threats to seek hospital admission.  (1966). Psychiatric Services, 47(8), [phone]. (DOI: 10.1176/ps.47.8.871).   Patient has not benefited from past hospitalization under similar circumstances in terms of suicide risk modification or improvement in mental health or social conditions.  Patients repeated use of emergency department and inpatient services instead of recommended outpatient follow-up is ineffective in helping improvement.  Inpatient hospitalization is not indicated, and the patient is refusing interventions that the clinical care team has offered in the emergency department and prior inpatient clinical care team has offered to mitigate risk of self-harm, other than allowing [place] to observe the patient to sobriety or behavior.  This provider and Dr. Dwyane Dee have discussed assessment and treatment recommendations with patient.  Further we see no evidence of severe psychosis, cognitive impairment, intoxication, or other condition that prevents the patient from acting under their own choice and volition.  Recommendation:  Samuel Molina) is collaborating with [facility] to assist patient with outpatient psychiatric services that will address depression and substance use issues.  Will also contact [facility] to see if patient is able to return since his UDS was negative.    Samuel Molina also contacting [facility] since patient has reported that he lives in [place]  and Samuel Molina if decides to stay in [place]   Disposition:  Psychiatrically Cleared No evidence of imminent risk to self or others at present.   Patient does not meet criteria for psychiatric inpatient admission. Supportive therapy provided about ongoing stressors. Refer to IOP. Discussed crisis plan, support from social network, calling 911, coming to the Emergency Department, and calling Suicide Hotline. Other psychiatric resources and substance use resources given   Follow Up:   Discharge Instructions     To help you maintain a sober lifestyle, a substance abuse treatment program may be beneficial to you.  Contact one of the following providers at your earliest opportunity to ask about enrolling in their program:       [facility]      [place], [place] [address]      [phone]      Ask about their Substance Abuse Intensive Outpatient Program.  They also offer psychiatry/medication management and therapy.  New patients are being seen in their walk-in clinic.  Walk-in hours are Monday - Thursday from 8:00 am - 11:00 am for psychiatry, and Friday from 1:00 pm - 4:00 pm for therapy.  Walk-in patients are seen on a first come, first served basis, so try to arrive as early as possible for the best chance of being seen the same day.  Please note that only [facility] residents are eligible for these services.       [facility]      [address]      [place], [place] [address]      [phone]      Spoke to Dr. Krista Blue informed of recommendations and disposition   Larell Baney B. Samuel Goltz, NP

## 2020-05-05 NOTE — ED Notes (Addendum)
Spoke with Jeanett Schlein of [facility] in [place] who provided address for receiving facility: [address] [place] [address]

## 2020-05-05 NOTE — ED Notes (Addendum)
Patient given a lunch tray.  

## 2020-05-05 NOTE — BH Assessment (Signed)
Comprehensive Clinical Assessment (CCA) Note  05/05/2020 [name] [id number]  Chief Complaint:  Chief Complaint  Patient presents with  . Suicidal  . Depression   Visit Diagnosis: Major Depressive Disorder, Recurrent, Severe, without psychotic features, Anxiety Disorder, Substance Use Disorder   CCA Screening, Triage and Referral (STR)  Patient Reported Information How did you hear about [place]? Self  Referral name: Self Referral  Referral phone number: [phone])   Whom do you see for routine medical problems? I don't have a doctor  Practice/Facility Name: No data recorded Practice/Facility Phone Number: No data recorded Name of Contact: No data recorded Contact Number: No data recorded Contact Fax Number: No data recorded Prescriber Name: No data recorded Prescriber Address (if known): No data recorded  What Is the Reason for Your Visit/Call Today? Walberto Fitton is a 60 y.o. male with PMH significant for major depressive disorder, suicidal ideation, CVA, and substance use disorder who presents to the ED with complaints of suicidal ideation and specific plan to walk out in traffic.  How Long Has This Been Causing You Problems? <Week  What Do You Feel Would Help You the Most Today? Therapy; Medication   Have You Recently Been in Any Inpatient Treatment (Hospital/Detox/Crisis Center/28-Day Program)? Yes  Name/Location of Program/Hospital:[facility]  How Long Were You There? 2 Days  When Were You Discharged? 02/16/2020   Have You Ever Received Services From [employer] Before? No  Who Do You See at [facility]? No data recorded  Have You Recently Had Any Thoughts About Hurting Yourself? Yes  Are You Planning to Commit Suicide/Harm Yourself At This time? No   Have you Recently Had Thoughts About [place]? Yes  Explanation: No data recorded  Have You Used Any Alcohol or Drugs in the Past 24 Hours? Yes  How Long Ago Did You Use Drugs  or Alcohol? 2000  What Did You Use and How Much? Cocaine; unspecified amount   Do You Currently Have a Therapist/Psychiatrist? No  Name of Therapist/Psychiatrist: No data recorded  Have You Been Recently Discharged From Any Office Practice or Programs? No  Explanation of Discharge From Practice/Program: No data recorded    CCA Screening Triage Referral Assessment Type of Contact: Face-to-Face  Is this Initial or Reassessment? No data recorded Date Telepsych consult ordered in [facility]:  02/12/2020  Time Telepsych consult ordered in [facility]:  1827   Patient Reported Information Reviewed? Yes  Patient Left Without Being Seen? No data recorded Reason for Not Completing Assessment: No data recorded  Collateral Involvement: Pt provided his sister [name], [phone]) and provided consent for her to be contatced for emergency purposes only   Does Patient Have a Court Appointed Legal Guardian? No data recorded Name and Contact of Legal Guardian: Self  If Minor and Not Living with Parent(s), Who has Custody? n/a  Is CPS involved or ever been involved? Never  Is APS involved or ever been involved? Never   Patient Determined To Be At Risk for Harm To Self or Others Based on Review of Patient Reported Information or Presenting Complaint? Yes, for Harm to Others  Method: No Plan  Availability of Means: No access or NA  Intent: Vague intent or NA  Notification Required: Identifiable person is aware  Additional Information for Danger to Others Potential: -- (unknown)  Additional Comments for Danger to Others Potential: No data recorded Are There Guns or Other Weapons in Your Home? No  Types of Guns/Weapons: No data recorded Are These Weapons Safely  Secured?                            No data recorded Who Could Verify You Are Able To Have These Secured: No data recorded Do You Have any Outstanding Charges, Pending Court Dates, Parole/Probation? no  Contacted To Inform of Risk of  Harm To Self or Others: Intended Target for Harm to Others:   [facility] of Assessment[facility] ED   Does Patient Present under Involuntary Commitment? No  IVC Papers Initial File Date: 04/26/2020   [place] of Residence: [place]   Patient Currently Receiving the Following Services: -- (patient denies having any outpatient referrals)   Determination of Need: Emergent (2 hours)   Options For Referral: Outpatient Therapy; Chemical Dependency Intensive Outpatient Therapy (CDIOP); Medication Management; Intensive Outpatient Therapy     CCA Biopsychosocial Intake/Chief Complaint:  Treg Diemer is a 60 y.o. male with PMH significant for major depressive disorder, suicidal ideation, CVA, and substance use disorder who presents to the ED with complaints of suicidal ideation and specific plan to walk out in traffic.  Current Symptoms/Problems: Major depressive disorder, suicidal ideation, CVA, and substance use disorder who presents to the ED with complaints of suicidal ideation and specific plan to walk out in traffic.   Patient Reported Schizophrenia/Schizoaffective Diagnosis in Past: No   Strengths: n/a  Preferences: n/a  Abilities: n/a   Type of Services Patient Feels are Needed: n/a   Initial Clinical Notes/Concerns: n/a   Mental Health Symptoms Depression:  Difficulty Concentrating; Hopelessness; Change in energy/activity; Sleep (too much or little); Tearfulness; Weight gain/loss; Fatigue; Worthlessness; Irritability   Duration of Depressive symptoms: Less than two weeks   Mania:  Change in energy/activity; Irritability   Anxiety:   Difficulty concentrating; Fatigue; Irritability; Restlessness; Sleep; Worrying; Tension   Psychosis:  Hallucinations (States, "I hear a womens voice")   Duration of Psychotic symptoms: Less than six months   Trauma:  None   Obsessions:  None   Compulsions:  None   Inattention:  None   Hyperactivity/Impulsivity:  No data  recorded  Oppositional/Defiant Behaviors:  None   Emotional Irregularity:  None   Other Mood/Personality Symptoms:  No data recorded   Mental Status Exam Appearance and self-care  Stature:  Average   Weight:  Average weight   Clothing:  No data recorded  Grooming:  Normal   Cosmetic use:  None   Posture/gait:  Normal   Motor activity:  No data recorded  Sensorium  Attention:  Normal   Concentration:  Normal   Orientation:  X5   Recall/memory:  Normal   Affect and Mood  Affect:  Appropriate   Mood:  Depressed   Relating  Eye contact:  None   Facial expression:  Depressed   Attitude toward examiner:  Cooperative   Thought and Language  Speech flow: Normal   Thought content:  Appropriate to Mood and Circumstances   Preoccupation:  None   Hallucinations:  None   Organization:  No data recorded  [employer] of Knowledge:  Average   Intelligence:  Average   Abstraction:  Normal   Judgement:  Normal   Reality Testing:  Adequate   Insight:  Denial   Decision Making:  Normal   Social Functioning  Social Maturity:  No data recorded  Social Judgement:  Normal   Stress  Stressors:  No data recorded  Coping Ability:  Normal   Skill Deficits:  None  Supports:  No data recorded    Religion: Religion/Spirituality Are You A Religious Person?: No  Leisure/Recreation: Leisure / Recreation Do You Have Hobbies?: Yes Leisure and Hobbies: "poetry and basketball"  Exercise/Diet: Exercise/Diet Do You Exercise?: No Have You Gained or Lost A Significant Amount of Weight in the Past Six Months?: No Do You Follow a Special Diet?: No Do You Have Any Trouble Sleeping?: No   CCA Employment/Education Employment/Work Situation: Employment / Work Situation Employment situation: Unemployed Patient's job has been impacted by current illness: No What is the longest time patient has a held a job?: "18 years" Where was the patient employed  at that time?: "[employer]" Has patient ever been in the [employer]?: No  Education: Education Is Patient Currently Attending School?: No Did Teacher, adult education From [employer]?: No Did You Nutritional therapist?: No Did Heritage manager?: No Did You Have An Individualized Education Program (IIEP): No Did You Have Any Difficulty At [employer]?: No Patient's Education Has Been Impacted by Current Illness: No   CCA Family/Childhood History Family and Relationship History: Family history Marital status: Widowed Widowed, when?: Spouse passed July 2021 Are you sexually active?: Yes What is your sexual orientation?: I love women Has your sexual activity been affected by drugs, alcohol, medication, or emotional stress?: No Does patient have children?: Yes How many children?:  (2) How is patient's relationship with their children?: "pretty good"  Childhood History:  Childhood History By whom was/is the patient raised?: Both parents Description of patient's relationship with caregiver when they were a child: "great" How were you disciplined when you got in trouble as a child/adolescent?: "physically and mentally" Does patient have siblings?: No Did patient suffer any verbal/emotional/physical/sexual abuse as a child?: No Did patient suffer from severe childhood neglect?: No Has patient ever been sexually abused/assaulted/raped as an adolescent or adult?: No Was the patient ever a victim of a crime or a disaster?: No Witnessed domestic violence?: Yes Has patient been affected by domestic violence as an adult?: No  Child/Adolescent Assessment:     CCA Substance Use Alcohol/Drug Use: Alcohol / Drug Use Pain Medications: See PTA Prescriptions: See PTA Over the Counter: See PTA History of alcohol / drug use?: Yes Substance #1 Name of Substance 1: Cocaine 1 - Age of First Use: "freshman yr of college" 1 - Amount (size/oz): Average amt of use is $40 1 - Frequency: He has used cocaine  intermittently throughout the years. 1 - Duration: He has used cocaine intermittently throughout the years. 1 - Last Use / Amount: Per patient,  March 30, 2021                       ASAM's:  Six Dimensions of Multidimensional Assessment  Dimension 1:  Acute Intoxication and/or Withdrawal Potential:      Dimension 2:  Biomedical Conditions and Complications:      Dimension 3:  Emotional, Behavioral, or Cognitive Conditions and Complications:     Dimension 4:  Readiness to Change:     Dimension 5:  Relapse, Continued use, or Continued Problem Potential:     Dimension 6:  Recovery/Living Environment:     ASAM Severity Score:    ASAM Recommended Level of Treatment:     Substance use Disorder (SUD)    Recommendations for Services/Supports/Treatments:    DSM5 Diagnoses: Patient Active Problem List   Diagnosis Date Noted  . Bell's palsy 04/26/2020  . Malingering 03/25/2020  . Personality disorder ([place]) 02/13/2020  .  Bursitis 02/13/2020  . Substance induced mood disorder ([place]) 02/12/2020  . Severe recurrent major depression without psychotic features ([place]) 01/21/2020  . Cocaine abuse ([place]) 01/21/2020  . Hip pain 01/21/2020  . Major depression 01/20/2020  . Gastroesophageal reflux disease with esophagitis   . Morbid obesity ([place]) 09/19/2017  . Arthritis of knee 11/03/2015    Patient Centered Plan: Patient is on the following Treatment Plan(s):  Anxiety, Depression and Substance Abuse    Referrals to Alternative Service(s): Referred to Alternative Service(s):   Place:   Date:   Time:    Referred to Alternative Service(s):   Place:   Date:   Time:    Referred to Alternative Service(s):   Place:   Date:   Time:    Referred to Alternative Service(s):   Place:   Date:   Time:        Comprehensive Clinical Assessment (CCA) Screening, Triage and Referral Note   Patient is a 60 year old male that presents to [facility], voluntarily. States that he stopped someone on the  road and asked them for a ride to [facility]. He typically seeks treatment at [facility]. However, decided to seek treatment at [facility] because he didn't feel like [facility] was helping him. States, "They think my issues are only substance use but it's more going on with me".     Patient with current suicidal ideations. Onset of suicidal ideations started over a year ago. He has a plan to overdose. Sts, "Anything fast and in a hurry". He has tried to overdose on medications 2x's. The last suicide attempt by overdose was April 26, 2020. Denies self-mutilating behaviors. Current stressors: Death of spouse November 24, 2019 by MVA because of a drunk driver. The day of her accident patient reports a dispute with his spouse. He reports feeling a lot of guilt about his spouse's death and their interaction on that day. He has been isolating himself in the home and feels like, "The walls are closing in on me". He says that all of his spouse's belongings are still in the home and it makes her feel depressed. States, "I had 53 yrs of marriage with my wife and everything change in a matter of seconds". He also reports medical issues including a recent stroke, April 01, 2020. Depressive symptoms include: isolating self from others, hopelessness/worthlessness, tearful, anger/irritability, and guilt. Anxiety level is noted to be severe. He reports no panic attacks. Appetite is good. He reports significant weight loss of 48 pounds from August-present due to Acid Reflux. Sleep 12-14 hours of sleep in the past 2 weeks. Current support system is his sister and daughter. Denies history of abuse.   Patient reports homicidal ideations intermittently. He had homicidal ideations due to being placed in the hallway at [facility]. When asked about a homicidal plan he states, "Anyway I can get my hands on them".  He acknowledges a history of aggression and feels that he is narcissist. Denies current criminal legal charges.  Patient reports hearing a voice of a women  telling him, " You are not shit".   Patient reports drug use (cocaine). His age of first use for cocaine was around his freshman year of cocaine. He has used cocaine intermittently throughout the years. Average amt of use is $40.  Last use of cocaine was March 30, 2020. He also has a history of THC use when he was in college. No alcohol use.   Patient with multiple ED visits: 02/12/2020, 02/14/2020, 03/25/2020, 03/28/2020, 04/01/20, 04/26/2020 (2x's in one day),  04/26/2020, 04/27/2020, 04/27/2020 for similar complaints as noted today. He does not have an outpatient therapist and/psychiatrist. Patient [facility] at [facility] 02/16/2020, 01/29/2020, 01/20/2020.   Upon review of patient's MEDICAL RECORD NUMBERhe was just admitted at [facility] from 04/26/2020 to 05/04/2020 for cocaine use and suicidal attempt by overdosing on prescription medications including his gabapentin, atorvastatin, pantoprazole, lisinopril, citalopram, and tizanidine.  Behavioral health recommended inpatient stabilization.  On 05/03/2020, a counselor had spoken with [facility] [place] [phone] who accepted patient for admission on 05/04/2020 between 3-5 PM.  Yesterday at approximately 4:30 PM there was a note signed by Catheryn BaconWasike RN that he was being [facility] [place] via safe transport. This clinician, discussed with patient his [facility]. He states, "I didn't want to be at that place so I decided to leave".   Disposition: Per Assunta FoundShuvon Rankin, NP, patient is psych cleared.      Melynda Rippleoyka Henrine Hayter, Counselor   05/05/2020 Mee Hiveseginald Tennison [id number]  Chief Complaint:  Chief Complaint  Patient presents with  . Suicidal  . Depression   Visit Diagnosis: Anxiety, Depression and Substance Abuse  Patient Reported Information How did you hear about us? Self   Referral name: Self Referral   Referral phone number: [phone])  Whom do you see for routine medical problems?  I don't have a doctor   Practice/Facility Name: No data recorded  Practice/Facility Phone Number: No data recorded  Name of Contact: No data recorded  Contact Number: No data recorded  Contact Fax Number: No data recorded  Prescriber Name: No data recorded  Prescriber Address (if known): No data recorded What Is the Reason for Your Visit/Call Today? Mee HivesReginald Mearns is a 60 y.o. male with PMH significant for major depressive disorder, suicidal ideation, CVA, and substance use disorder who presents to the ED with complaints of suicidal ideation and specific plan to walk out in traffic.  How Long Has This Been Causing You Problems? <Week  Have You Recently Been in Any Inpatient Treatment (Hospital/Detox/Crisis Center/28-Day Program)? Yes   Name/Location of Program/Hospital:[facility]   How Long Were You There? 2 Days   When Were You Discharged? 02/16/2020  Have You Ever Received Services From [facility] Before? No   Who [facility] You [facility] at [facility]? No data recorded Have You Recently Had Any Thoughts About Hurting Yourself? Yes   Are You Planning to Commit Suicide/Harm Yourself At This time?  No  Have you Recently Had Thoughts About Hurting Someone Karolee Ohslse? Yes   Explanation: No data recorded Have You Used Any Alcohol or Drugs in the Past 24 Hours? Yes   How Long Ago Did You Use Drugs or Alcohol?  2000   What Did You Use and How Much? Cocaine; unspecified amount  What Do You Feel Would Help You the Most Today? Therapy; Medication  Do You Currently Have a Therapist/Psychiatrist? No   Name of Therapist/Psychiatrist: No data recorded  Have You Been Recently Discharged From Any Office Practice or Programs? No   Explanation of Discharge From Practice/Program:  No data recorded    CCA Screening Triage Referral Assessment Type of Contact: Face-to-Face   Is this Initial or Reassessment? No data recorded  Date Telepsych consult ordered in [facility]:  02/12/2020   Time Telepsych consult  [facility]:  1827  Patient Reported Information Reviewed? Yes   Patient Left Without Being Seen? No data recorded  Reason for Not Completing Assessment: No data recorded Collateral  Involvement: Pt provided his sister [name], [phone]) and provided consent for her to be contatced for emergency purposes only  Does Patient Have a Court Appointed Legal Guardian? No data recorded  Name and Contact of Legal Guardian:  Self  If Minor and Not Living with Parent(s), Who has Custody? n/a  Is CPS involved or ever been involved? Never  Is APS involved or ever been involved? Never  Patient Determined To Be At Risk for Harm To Self or Others Based on Review of Patient Reported Information or Presenting Complaint? Yes, for Harm to Others   Method: No Plan   Availability of Means: No access or NA   Intent: Vague intent or NA   Notification Required: Identifiable person is aware   Additional Information for Danger to Others Potential:  -- (unknown)   Additional Comments for Danger to Others Potential:  No data recorded  Are There Guns or Other Weapons in Your Home?  No    Types of Guns/Weapons: No data recorded   Are These Weapons Safely Secured?                              No data recorded   Who Could Verify You Are Able To Have These Secured:    No data recorded Do You Have any Outstanding Charges, Pending Court Dates, Parole/Probation? no  Contacted To Inform of Risk of Harm To Self or Others: Intended Target for Harm to Others:  Location of Assessment: [facility] ED  Does Patient Present under Involuntary Commitment? No   IVC Papers Initial File Date: 04/26/2020   [place] of Residence: [place]  Patient Currently Receiving the Following Services: -- (patient denies having any outpatient referrals)   Determination of Need: Emergent (2 hours)   Options For Referral: Outpatient Therapy; Chemical Dependency Intensive Outpatient Therapy (CDIOP); Medication Management; Intensive  Outpatient Therapy   Waldon Merl, Counselor

## 2020-05-05 NOTE — ED Notes (Signed)
Pt ambulatory to xray.

## 2020-05-05 NOTE — BH Assessment (Signed)
[place] Assessment Progress Note  Per Shuvon Rankin, NP, this voluntary pt does not require psychiatric hospitalization at this time.  Pt is psychiatrically cleared.  Discharge instructions advise pt to return to [place], and also include information for area substance abuse treatment providers.  EDP Krista Blue, PA and pt's nurse, Carolynne, have been notified.  [name], [place] Triage Specialist [phone]

## 2020-05-05 NOTE — ED Notes (Signed)
Pt informed that safe transport was notified of need for transportation services.  Pt insistent on ambulating to ED exit to wait for ride at ED entrance where safe transport was instructed to arrive.

## 2020-05-05 NOTE — ED Notes (Addendum)
Discharge paperwork reviewed with pt, including follow up resources for psychiatric treatment.  Pt verbalized understanding, with no questions or concerns at time of discharge.   RN contacted safe transport to transport pt to [place] block of [place]

## 2020-05-26 ENCOUNTER — Emergency Department (HOSPITAL_COMMUNITY)
Admission: EM | Admit: 2020-05-26 | Discharge: 2020-05-26 | Disposition: A | Discharge status: Home / Self Care | Payer: HRSA Program | Attending: Emergency Medicine | Admitting: Emergency Medicine

## 2020-05-26 ENCOUNTER — Encounter (HOSPITAL_COMMUNITY): Payer: Self-pay

## 2020-05-26 ENCOUNTER — Other Ambulatory Visit: Payer: Self-pay

## 2020-05-26 DIAGNOSIS — J069 Acute upper respiratory infection, unspecified: Secondary | ICD-10-CM | POA: Insufficient documentation

## 2020-05-26 DIAGNOSIS — U071 COVID-19: Secondary | ICD-10-CM | POA: Diagnosis not present

## 2020-05-26 DIAGNOSIS — Z7982 Long term (current) use of aspirin: Secondary | ICD-10-CM | POA: Diagnosis not present

## 2020-05-26 DIAGNOSIS — F17210 Nicotine dependence, cigarettes, uncomplicated: Secondary | ICD-10-CM | POA: Insufficient documentation

## 2020-05-26 DIAGNOSIS — Z8673 Personal history of transient ischemic attack (TIA), and cerebral infarction without residual deficits: Secondary | ICD-10-CM | POA: Diagnosis not present

## 2020-05-26 DIAGNOSIS — R059 Cough, unspecified: Secondary | ICD-10-CM | POA: Diagnosis present

## 2020-05-26 LAB — SARS CORONAVIRUS 2 (TAT 6-24 HRS): SARS Coronavirus 2: POSITIVE — AB

## 2020-05-26 MED ORDER — PANTOPRAZOLE SODIUM 40 MG PO TBEC: 40.0000 mg | DELAYED_RELEASE_TABLET | Freq: Every day | ORAL | 1 refills | Status: DC

## 2020-05-26 NOTE — Discharge Instructions (Signed)
Return if any problems.  Your covid test is pending  °

## 2020-05-26 NOTE — ED Provider Notes (Signed)
[facility] DEPT Provider Note   CSN: [id number] Arrival date & time: 05/26/20  1426     History Chief Complaint  Patient presents with  . Cough    Samuel Molina is a 60 y.o. male.  The history is provided by the patient. No language interpreter was used.  Cough Cough characteristics:  Non-productive Sputum characteristics:  Nondescript Severity:  Moderate Onset quality:  Gradual Duration:  2 weeks Timing:  Constant Progression:  Worsening Chronicity:  New Smoker: no   Context: not sick contacts   Relieved by:  Nothing Worsened by:  Nothing Ineffective treatments:  None tried Associated symptoms: no fever        Past Medical History:  Diagnosis Date  . Anxiety   . Depression   . GERD (gastroesophageal reflux disease)   . Obesity   . Stroke ([place])   . Substance abuse [facility])     Patient Active Problem List   Diagnosis Date Noted  . Bell's palsy 04/26/2020  . Malingering 03/25/2020  . Personality disorder ([place]) 02/13/2020  . Bursitis 02/13/2020  . Substance induced mood disorder ([place]) 02/12/2020  . Severe recurrent major depression without psychotic features ([place]) 01/21/2020  . Cocaine abuse ([place]) 01/21/2020  . Hip pain 01/21/2020  . Major depression 01/20/2020  . Gastroesophageal reflux disease with esophagitis   . Morbid obesity ([place]) 09/19/2017  . Arthritis of knee 11/03/2015    Past Surgical History:  Procedure Laterality Date  . ESOPHAGOGASTRODUODENOSCOPY (EGD) WITH PROPOFOL N/A 09/20/2017   Procedure: ESOPHAGOGASTRODUODENOSCOPY (EGD) WITH PROPOFOL;  Surgeon: Lin Landsman, MD;  Location: [facility];  Service: Gastroenterology;  Laterality: N/A;  . KNEE SURGERY Right        History reviewed. No pertinent family history.  Social History   Tobacco Use  . Smoking status: Current Every Day Smoker    Packs/day: 0.50    Types: Cigarettes  . Smokeless tobacco: Never Used  Vaping Use  . Vaping Use:  Never used  Substance Use Topics  . Alcohol use: Not Currently  . Drug use: Yes    Types: Cocaine    Home Medications Prior to Admission medications   Medication Sig Start Date End Date Taking? Authorizing Provider  pantoprazole (PROTONIX) 40 MG tablet Take 1 tablet (40 mg total) by mouth daily. 05/26/20 05/26/21 Yes Fransico Meadow, PA-C  aspirin EC 81 MG tablet Take 81 mg by mouth daily. Swallow whole.    [provider]  atorvastatin (LIPITOR) 80 MG tablet Take 80 mg by mouth every evening.    [provider]  citalopram (CELEXA) 10 MG tablet Take 1 tablet (10 mg total) by mouth daily. Patient taking differently: Take 5 mg by mouth daily. 03/30/20   Rankin, Shuvon B, NP  gabapentin (NEURONTIN) 400 MG capsule Take 400 mg by mouth 3 (three) times daily.    [provider]  lisinopril (ZESTRIL) 5 MG tablet Take 5 mg by mouth daily.    [provider]  QUEtiapine (SEROQUEL) 25 MG tablet Take 1 tablet (25 mg total) by mouth 2 (two) times daily. Patient not taking: No sig reported 03/30/20   Rankin, Shuvon B, NP  QUEtiapine (SEROQUEL) 300 MG tablet Take 1 tablet (300 mg total) by mouth at bedtime. Patient not taking: No sig reported 03/30/20   Derrill Center, NP  tiZANidine (ZANAFLEX) 2 MG tablet Take 2 mg by mouth every 8 (eight) hours as needed for muscle spasms.    [provider]  omeprazole (  PRILOSEC OTC) 20 MG tablet Take 2 tablets (40 mg total) by mouth 2 (two) times daily before a meal. 09/19/17 01/29/19  Vanga, Tally Due, MD    Allergies    Acetaminophen  Review of Systems   Review of Systems  Constitutional: Negative for fever.  Respiratory: Positive for cough.   All other systems reviewed and are negative.   Physical Exam Updated Vital Signs BP 124/75 (BP Location: Right Arm)   Pulse 76   Temp (!) 100.8 F (38.2 C) (Oral)   Resp 20   SpO2 97%   Physical Exam Vitals and nursing note reviewed.  Constitutional:       Appearance: He is well-developed and well-nourished.  HENT:     Head: Normocephalic.  Eyes:     Extraocular Movements: EOM normal.  Cardiovascular:     Rate and Rhythm: Normal rate.  Pulmonary:     Effort: Pulmonary effort is normal.  Abdominal:     General: There is no distension.  Musculoskeletal:        General: Normal range of motion.     Cervical back: Normal range of motion.  Skin:    General: Skin is warm.  Neurological:     Mental Status: He is alert and oriented to person, place, and time.  Psychiatric:        Mood and Affect: Mood and affect and mood normal.     ED Results / Procedures / Treatments   Labs (all labs ordered are listed, but only abnormal results are displayed) Labs Reviewed  SARS CORONAVIRUS 2 (TAT 6-24 HRS)    EKG None  Radiology No results found.  Procedures Procedures   Medications Ordered in ED Medications - No data to display  ED Course  I have reviewed the triage vital signs and the nursing notes.  Pertinent labs & imaging results that were available during my care of the patient were reviewed by me and considered in my medical decision making (see chart for details).    MDM Rules/Calculators/A&P                          MDM:  Covid ordered and pending.  Pt request rx for protonix for reflux Final Clinical Impression(s) / ED Diagnoses Final diagnoses:  Viral URI with cough    Rx / DC Orders ED Discharge Orders         Ordered    pantoprazole (PROTONIX) 40 MG tablet  Daily        05/26/20 1522        An After Visit Summary was printed and given to the patient.    Sidney Ace 05/26/20 1526    Luna Fuse, MD 05/26/20 2239

## 2020-05-26 NOTE — ED Triage Notes (Signed)
Pt arrived via walk in, c/o cough since last night. No known COVID exposures. States his employment sent him here for covid testing.

## 2020-05-27 ENCOUNTER — Telehealth: Payer: Self-pay

## 2020-05-27 NOTE — Telephone Encounter (Signed)
Called to discuss with patient about COVID-19 symptoms and the use of one of the available treatments for those with mild to moderate Covid symptoms and at a high risk of hospitalization.  Pt appears to qualify for outpatient treatment due to co-morbid conditions and/or a member of an at-risk group in accordance with the FDA Emergency Use Authorization.    Symptom onset: 2 weeks ago per ED note. Vaccinated: Unknown Booster? Unknown Immunocompromised? No Qualifiers: HTN Pt. Is out of 7 day window for treatment. Unable to reach pt - Phone not in service.  Marcello Moores

## 2020-06-05 ENCOUNTER — Emergency Department: Payer: Self-pay

## 2020-06-05 ENCOUNTER — Observation Stay: Payer: Self-pay

## 2020-06-05 ENCOUNTER — Encounter: Payer: Self-pay | Admitting: Family Medicine

## 2020-06-05 ENCOUNTER — Other Ambulatory Visit: Payer: Self-pay

## 2020-06-05 ENCOUNTER — Inpatient Hospital Stay
Admission: EM | Admit: 2020-06-05 | Discharge: 2020-06-11 | DRG: 897 | Disposition: A | Discharge status: Home / Self Care | Payer: Self-pay | Attending: Internal Medicine | Admitting: Internal Medicine

## 2020-06-05 DIAGNOSIS — F1410 Cocaine abuse, uncomplicated: Principal | ICD-10-CM | POA: Diagnosis present

## 2020-06-05 DIAGNOSIS — G510 Bell's palsy: Secondary | ICD-10-CM | POA: Diagnosis present

## 2020-06-05 DIAGNOSIS — R471 Dysarthria and anarthria: Secondary | ICD-10-CM | POA: Diagnosis present

## 2020-06-05 DIAGNOSIS — U071 COVID-19: Secondary | ICD-10-CM | POA: Diagnosis present

## 2020-06-05 DIAGNOSIS — R531 Weakness: Secondary | ICD-10-CM

## 2020-06-05 DIAGNOSIS — R059 Cough, unspecified: Secondary | ICD-10-CM | POA: Diagnosis present

## 2020-06-05 DIAGNOSIS — R0602 Shortness of breath: Secondary | ICD-10-CM | POA: Diagnosis present

## 2020-06-05 DIAGNOSIS — G459 Transient cerebral ischemic attack, unspecified: Secondary | ICD-10-CM

## 2020-06-05 DIAGNOSIS — Z6829 Body mass index (BMI) 29.0-29.9, adult: Secondary | ICD-10-CM

## 2020-06-05 DIAGNOSIS — I69354 Hemiplegia and hemiparesis following cerebral infarction affecting left non-dominant side: Secondary | ICD-10-CM

## 2020-06-05 DIAGNOSIS — E785 Hyperlipidemia, unspecified: Secondary | ICD-10-CM | POA: Diagnosis present

## 2020-06-05 DIAGNOSIS — Z8673 Personal history of transient ischemic attack (TIA), and cerebral infarction without residual deficits: Secondary | ICD-10-CM | POA: Diagnosis present

## 2020-06-05 DIAGNOSIS — I776 Arteritis, unspecified: Secondary | ICD-10-CM

## 2020-06-05 DIAGNOSIS — F17210 Nicotine dependence, cigarettes, uncomplicated: Secondary | ICD-10-CM | POA: Diagnosis present

## 2020-06-05 DIAGNOSIS — E669 Obesity, unspecified: Secondary | ICD-10-CM | POA: Diagnosis present

## 2020-06-05 DIAGNOSIS — R079 Chest pain, unspecified: Secondary | ICD-10-CM

## 2020-06-05 DIAGNOSIS — L02414 Cutaneous abscess of left upper limb: Secondary | ICD-10-CM | POA: Diagnosis present

## 2020-06-05 DIAGNOSIS — I639 Cerebral infarction, unspecified: Secondary | ICD-10-CM | POA: Diagnosis present

## 2020-06-05 DIAGNOSIS — R1310 Dysphagia, unspecified: Secondary | ICD-10-CM | POA: Diagnosis present

## 2020-06-05 DIAGNOSIS — M549 Dorsalgia, unspecified: Secondary | ICD-10-CM | POA: Diagnosis not present

## 2020-06-05 DIAGNOSIS — Z79899 Other long term (current) drug therapy: Secondary | ICD-10-CM

## 2020-06-05 DIAGNOSIS — M5030 Other cervical disc degeneration, unspecified cervical region: Secondary | ICD-10-CM | POA: Diagnosis present

## 2020-06-05 DIAGNOSIS — Z720 Tobacco use: Secondary | ICD-10-CM | POA: Diagnosis present

## 2020-06-05 DIAGNOSIS — M47812 Spondylosis without myelopathy or radiculopathy, cervical region: Secondary | ICD-10-CM | POA: Diagnosis present

## 2020-06-05 DIAGNOSIS — F419 Anxiety disorder, unspecified: Secondary | ICD-10-CM | POA: Diagnosis present

## 2020-06-05 DIAGNOSIS — L02416 Cutaneous abscess of left lower limb: Secondary | ICD-10-CM | POA: Diagnosis present

## 2020-06-05 DIAGNOSIS — Z886 Allergy status to analgesic agent status: Secondary | ICD-10-CM

## 2020-06-05 DIAGNOSIS — F32A Depression, unspecified: Secondary | ICD-10-CM | POA: Diagnosis present

## 2020-06-05 DIAGNOSIS — Z8616 Personal history of COVID-19: Secondary | ICD-10-CM

## 2020-06-05 DIAGNOSIS — R112 Nausea with vomiting, unspecified: Secondary | ICD-10-CM | POA: Diagnosis present

## 2020-06-05 DIAGNOSIS — Z7982 Long term (current) use of aspirin: Secondary | ICD-10-CM

## 2020-06-05 DIAGNOSIS — Z8249 Family history of ischemic heart disease and other diseases of the circulatory system: Secondary | ICD-10-CM

## 2020-06-05 DIAGNOSIS — I10 Essential (primary) hypertension: Secondary | ICD-10-CM | POA: Diagnosis present

## 2020-06-05 DIAGNOSIS — R29810 Facial weakness: Secondary | ICD-10-CM

## 2020-06-05 DIAGNOSIS — L739 Follicular disorder, unspecified: Secondary | ICD-10-CM | POA: Diagnosis present

## 2020-06-05 DIAGNOSIS — K2100 Gastro-esophageal reflux disease with esophagitis, without bleeding: Secondary | ICD-10-CM | POA: Diagnosis present

## 2020-06-05 DIAGNOSIS — R519 Headache, unspecified: Secondary | ICD-10-CM

## 2020-06-05 LAB — COMPREHENSIVE METABOLIC PANEL
ALT: 24 U/L (ref 0–44)
AST: 28 U/L (ref 15–41)
Albumin: 4.3 g/dL (ref 3.5–5.0)
Alkaline Phosphatase: 94 U/L (ref 38–126)
Anion gap: 11 (ref 5–15)
BUN: 19 mg/dL (ref 6–20)
CO2: 23 mmol/L (ref 22–32)
Calcium: 9.1 mg/dL (ref 8.9–10.3)
Chloride: 108 mmol/L (ref 98–111)
Creatinine, Ser: 0.73 mg/dL (ref 0.61–1.24)
GFR, Estimated: >60 mL/min (ref >60)
Glucose, Bld: 99 mg/dL (ref 70–99)
Potassium: 3.6 mmol/L (ref 3.5–5.1)
Sodium: 142 mmol/L (ref 135–145)
Total Bilirubin: 0.8 mg/dL (ref 0.3–1.2)
Total Protein: 8.4 g/dL — ABNORMAL HIGH (ref 6.5–8.1)

## 2020-06-05 LAB — DIFFERENTIAL
Abs Immature Granulocytes: 0.03 10*3/uL (ref 0.00–0.07)
Basophils Absolute: 0 10*3/uL (ref 0.0–0.1)
Basophils Relative: 0 %
Eosinophils Absolute: 0 10*3/uL (ref 0.0–0.5)
Eosinophils Relative: 0 %
Immature Granulocytes: 0 %
Lymphocytes Relative: 24 %
Lymphs Abs: 1.8 10*3/uL (ref 0.7–4.0)
Monocytes Absolute: 1 10*3/uL (ref 0.1–1.0)
Monocytes Relative: 14 %
Neutro Abs: 4.7 10*3/uL (ref 1.7–7.7)
Neutrophils Relative %: 62 %

## 2020-06-05 LAB — CBC
HCT: 42 % (ref 39.0–52.0)
Hemoglobin: 13.3 g/dL (ref 13.0–17.0)
MCH: 25.7 pg — ABNORMAL LOW (ref 26.0–34.0)
MCHC: 31.7 g/dL (ref 30.0–36.0)
MCV: 81.1 fL (ref 80.0–100.0)
Platelets: 317 10*3/uL (ref 150–400)
RBC: 5.18 MIL/uL (ref 4.22–5.81)
RDW: 15.1 % (ref 11.5–15.5)
WBC: 7.6 10*3/uL (ref 4.0–10.5)
nRBC: 0 % (ref 0.0–0.2)

## 2020-06-05 LAB — URINALYSIS, ROUTINE W REFLEX MICROSCOPIC
Bacteria, UA: NONE SEEN
Bilirubin Urine: NEGATIVE
Glucose, UA: NEGATIVE mg/dL
Ketones, ur: 20 mg/dL — AB
Leukocytes,Ua: NEGATIVE
Nitrite: NEGATIVE
Protein, ur: NEGATIVE mg/dL
Specific Gravity, Urine: >1.046 — ABNORMAL HIGH (ref 1.005–1.030)
Squamous Epithelial / HPF: NONE SEEN (ref 0–5)
pH: 5 (ref 5.0–8.0)

## 2020-06-05 LAB — URINE DRUG SCREEN, QUALITATIVE (ARMC ONLY)
Amphetamines, Ur Screen: NOT DETECTED
Barbiturates, Ur Screen: NOT DETECTED
Benzodiazepine, Ur Scrn: NOT DETECTED
Cannabinoid 50 Ng, Ur ~~LOC~~: NOT DETECTED
Cocaine Metabolite,Ur ~~LOC~~: POSITIVE — AB
MDMA (Ecstasy)Ur Screen: NOT DETECTED
Methadone Scn, Ur: NOT DETECTED
Opiate, Ur Screen: NOT DETECTED
Phencyclidine (PCP) Ur S: NOT DETECTED
Tricyclic, Ur Screen: NOT DETECTED

## 2020-06-05 LAB — HEPATITIS C ANTIBODY: HCV Ab: NONREACTIVE

## 2020-06-05 LAB — SEDIMENTATION RATE: Sed Rate: 25 mm/hr — ABNORMAL HIGH (ref 0–20)

## 2020-06-05 LAB — PROTIME-INR
INR: 1.1 (ref 0.8–1.2)
Prothrombin Time: 14 seconds (ref 11.4–15.2)

## 2020-06-05 LAB — ETHANOL: Alcohol, Ethyl (B): <10 mg/dL (ref <10)

## 2020-06-05 LAB — CK: Total CK: 412 U/L — ABNORMAL HIGH (ref 49–397)

## 2020-06-05 LAB — APTT: aPTT: 27 seconds (ref 24–36)

## 2020-06-05 LAB — TROPONIN I (HIGH SENSITIVITY)
Troponin I (High Sensitivity): 10 ng/L (ref <18)
Troponin I (High Sensitivity): 10 ng/L (ref <18)

## 2020-06-05 LAB — HIV ANTIBODY (ROUTINE TESTING W REFLEX): HIV Screen 4th Generation wRfx: NONREACTIVE

## 2020-06-05 MED ORDER — STROKE: EARLY STAGES OF RECOVERY BOOK: Freq: Once | Status: AC

## 2020-06-05 MED ORDER — ALTEPLASE 100 MG IV SOLR
INTRAVENOUS | Status: AC
  Filled 2020-06-05: qty 100

## 2020-06-05 MED ORDER — LISINOPRIL 5 MG PO TABS
5.0000 mg | ORAL_TABLET | Freq: Every day | ORAL | Status: DC
  Administered 2020-06-05: 5 mg via ORAL
  Filled 2020-06-05: qty 1

## 2020-06-05 MED ORDER — ALBUTEROL SULFATE HFA 108 (90 BASE) MCG/ACT IN AERS
2.0000 | INHALATION_SPRAY | RESPIRATORY_TRACT | Status: DC | PRN
  Filled 2020-06-05: qty 6.7

## 2020-06-05 MED ORDER — PANTOPRAZOLE SODIUM 40 MG PO TBEC
40.0000 mg | DELAYED_RELEASE_TABLET | Freq: Every day | ORAL | Status: DC
  Administered 2020-06-05 – 2020-06-11 (×7): 40 mg via ORAL
  Filled 2020-06-05 (×7): qty 1

## 2020-06-05 MED ORDER — DM-GUAIFENESIN ER 30-600 MG PO TB12: 1.0000 | ORAL_TABLET | Freq: Two times a day (BID) | ORAL | Status: DC | PRN

## 2020-06-05 MED ORDER — SODIUM CHLORIDE 0.9 % IV BOLUS
1000.0000 mL | Freq: Once | INTRAVENOUS | Status: AC
  Administered 2020-06-05: 1000 mL via INTRAVENOUS

## 2020-06-05 MED ORDER — IOHEXOL 350 MG/ML SOLN
100.0000 mL | Freq: Once | INTRAVENOUS | Status: AC | PRN
  Administered 2020-06-05: 100 mL via INTRAVENOUS

## 2020-06-05 MED ORDER — OXYCODONE HCL 5 MG PO TABS
5.0000 mg | ORAL_TABLET | ORAL | Status: DC | PRN
  Administered 2020-06-05 – 2020-06-09 (×9): 5 mg via ORAL
  Filled 2020-06-05 (×10): qty 1

## 2020-06-05 MED ORDER — CITALOPRAM HYDROBROMIDE 20 MG PO TABS
20.0000 mg | ORAL_TABLET | Freq: Every day | ORAL | Status: DC
  Administered 2020-06-05 – 2020-06-11 (×7): 20 mg via ORAL
  Filled 2020-06-05 (×7): qty 1

## 2020-06-05 MED ORDER — ATORVASTATIN CALCIUM 20 MG PO TABS
80.0000 mg | ORAL_TABLET | Freq: Every evening | ORAL | Status: DC
Start: 2020-06-05 — End: 2020-06-11
  Administered 2020-06-06 – 2020-06-10 (×5): 80 mg via ORAL
  Filled 2020-06-05 (×6): qty 4

## 2020-06-05 MED ORDER — ENOXAPARIN SODIUM 40 MG/0.4ML ~~LOC~~ SOLN
40.0000 mg | SUBCUTANEOUS | Status: DC
  Administered 2020-06-05 – 2020-06-09 (×5): 40 mg via SUBCUTANEOUS
  Filled 2020-06-05 (×5): qty 0.4

## 2020-06-05 MED ORDER — SENNOSIDES-DOCUSATE SODIUM 8.6-50 MG PO TABS: 1.0000 | ORAL_TABLET | Freq: Every evening | ORAL | Status: DC | PRN

## 2020-06-05 MED ORDER — GABAPENTIN 100 MG PO CAPS
400.0000 mg | ORAL_CAPSULE | Freq: Three times a day (TID) | ORAL | Status: DC
  Administered 2020-06-05 – 2020-06-11 (×19): 400 mg via ORAL
  Filled 2020-06-05 (×19): qty 1

## 2020-06-05 MED ORDER — TIZANIDINE HCL 2 MG PO TABS
2.0000 mg | ORAL_TABLET | Freq: Three times a day (TID) | ORAL | Status: DC | PRN
  Filled 2020-06-05: qty 1

## 2020-06-05 MED ORDER — FAMOTIDINE 20 MG PO TABS
20.0000 mg | ORAL_TABLET | Freq: Two times a day (BID) | ORAL | Status: DC
  Administered 2020-06-05 – 2020-06-11 (×13): 20 mg via ORAL
  Filled 2020-06-05 (×13): qty 1

## 2020-06-05 MED ORDER — MAGNESIUM OXIDE 400 (241.3 MG) MG PO TABS
400.0000 mg | ORAL_TABLET | Freq: Every day | ORAL | Status: DC
  Administered 2020-06-05 – 2020-06-11 (×7): 400 mg via ORAL
  Filled 2020-06-05 (×7): qty 1

## 2020-06-05 MED ORDER — HYDRALAZINE HCL 20 MG/ML IJ SOLN: 5.0000 mg | INTRAMUSCULAR | Status: DC | PRN

## 2020-06-05 MED ORDER — ASPIRIN EC 81 MG PO TBEC
81.0000 mg | DELAYED_RELEASE_TABLET | Freq: Every day | ORAL | Status: DC
  Administered 2020-06-05 – 2020-06-11 (×7): 81 mg via ORAL
  Filled 2020-06-05 (×7): qty 1

## 2020-06-05 MED ORDER — SODIUM CHLORIDE 0.9 % IV SOLN: INTRAVENOUS | Status: DC

## 2020-06-05 MED ORDER — DICLOFENAC SODIUM 1 % EX GEL
4.0000 g | Freq: Four times a day (QID) | CUTANEOUS | Status: DC
  Administered 2020-06-05 – 2020-06-11 (×21): 4 g via TOPICAL
  Filled 2020-06-05 (×2): qty 100

## 2020-06-05 MED ORDER — GADOBUTROL 1 MMOL/ML IV SOLN
10.0000 mL | Freq: Once | INTRAVENOUS | Status: AC | PRN
  Administered 2020-06-05: 10 mL via INTRAVENOUS

## 2020-06-05 MED ORDER — NICOTINE 21 MG/24HR TD PT24
21.0000 mg | MEDICATED_PATCH | Freq: Every day | TRANSDERMAL | Status: DC
  Filled 2020-06-05 (×3): qty 1

## 2020-06-05 MED ORDER — LIDOCAINE 5 % EX PTCH
1.0000 | MEDICATED_PATCH | CUTANEOUS | Status: DC
  Administered 2020-06-06 – 2020-06-10 (×6): 1 via TRANSDERMAL
  Filled 2020-06-05 (×7): qty 1

## 2020-06-05 MED ORDER — ONDANSETRON HCL 4 MG PO TABS: 4.0000 mg | ORAL_TABLET | Freq: Four times a day (QID) | ORAL | Status: DC | PRN

## 2020-06-05 MED ORDER — MAGNESIUM OXIDE 400 MG PO TABS
400.0000 mg | ORAL_TABLET | Freq: Every day | ORAL | Status: DC
  Filled 2020-06-05: qty 1

## 2020-06-05 MED ORDER — RIBOFLAVIN 100 MG PO CAPS: 1.0000 | ORAL_CAPSULE | Freq: Every day | ORAL | Status: DC

## 2020-06-05 MED ORDER — SODIUM CHLORIDE 0.9% FLUSH
3.0000 mL | Freq: Once | INTRAVENOUS | Status: AC
Start: 2020-06-05 — End: 2020-06-05
  Administered 2020-06-05: 3 mL via INTRAVENOUS

## 2020-06-05 NOTE — Progress Notes (Signed)
SLP Cancellation Note  Patient Details Name: Samuel Molina MRN: [id number] DOB: 05-18-1960   Cancelled treatment:       Reason Eval/Treat Not Completed: SLP screened, no needs identified, will sign off  Chart reviewed, pt observed with no acute deficits identified. Please re-consult if pt experiences a decline in ability.   Bryony Kaman B. Rutherford Nail M.S., CCC-SLP, [place] Office [phone]   [name] Rutherford Nail 06/05/2020, 10:06 AM

## 2020-06-05 NOTE — ED Notes (Signed)
Pt given icee.

## 2020-06-05 NOTE — ED Notes (Signed)
Patient now reporting 10/10 chest pain. Repeat EKG obtained and given to MD Tamala Julian.

## 2020-06-05 NOTE — ED Notes (Signed)
Pt to MRI at this time.

## 2020-06-05 NOTE — ED Notes (Signed)
This RN noted pt to be resting comfortably in bed with eyes closed and audible snores. This RN left meal tray at bedside for when pt wakes up.

## 2020-06-05 NOTE — ED Notes (Signed)
OT at bedside. Per OT, pt asleep at this time will come back to see pt later.

## 2020-06-05 NOTE — ED Notes (Signed)
Pt up and ambulatory outside of room in search of bathroom. Writer directed pt where bathroom in room was located. RN Laurette Schimke notified that pt now ambulatory with steady slow gait without assistance.

## 2020-06-05 NOTE — ED Notes (Addendum)
Admitting MD Randie Heinz at this time regarding pt BP. Per MD Blaine Hamper, hold BP meds at this time and give IVF bolus per [facility] specifications at this time.

## 2020-06-05 NOTE — ED Notes (Signed)
Pt ambulatory to door of ED24A at this time. NAD noted, steady gait noted.   Pt stated unit phone wasn't working. This RN redirected pt back to bed and assisted with using phone at this time.

## 2020-06-05 NOTE — H&P (Addendum)
History and Physical    [name] GUR:[id number] DOB: June 03, 1960 DOA: 06/05/2020  Referring MD/NP/PA:   PCP: Patient, No Pcp Per   Patient coming from:  The patient is coming from home.  At baseline, pt is independent for most of ADL.        Chief Complaint: Left sided weakness  HPI: Samuel Molina is a 60 y.o. male with medical history significant of stroke with left-sided weakness, hypertension, hyperlipidemia, GERD, depression, Bell's palsy with left facial droop, cocaine abuse, tobacco abuse, who presents with worsening left-sided weakness.  Patient states that he has history of stroke with left-sided weakness and history of Bell's palsy with left facial droop. He states that he developed worsening left-sided weakness at about 2:30 AM while he was walking to [employer].  No vision loss or hearing loss.  Patient had one episode of chest pain which was located in the substernal area, severe, pressure-like, nonradiating.  He states his chest pain lasted shortly and has resolved currently.  He denies shortness breath, fever or chills.  He has intermittent mild dry cough recently which has not changed.  Patient states that he has chronic intermittent nausea and vomiting for a long time which has not changed, but currently no nausea, vomiting, diarrhea or abdominal pain.  No symptoms of UTI.  Of note patient had positive Covid test on 05/26/2020.  Currently patient only has mild dry cough, no shortness breath, fever, chills.  ED Course: pt was found to have WBC 7.6, INR 1.1, PTT 27, troponin level 10, 10, UDS positive for cocaine, alcohol level less than 10, electrolytes renal function okay, negative urinalysis, temperature normal, blood pressure 143/68, heart rate 59, RR 15, oxygen saturation 97% on room air.  CT-head, MRI of brain, CTA of head and neck, and CT of cerebral perfusion are all negative for acute abnormalities. Tele neurology, Dr. Humberto Seals was consulted byb  EDP and recommended admission.  I also consulted Dr. Curly Shores of our neurologist.   Review of Systems:   General: no fevers, chills, no body weight gain, has fatigue HEENT: no blurry vision, hearing changes or sore throat Respiratory: no dyspnea, has coughing, no wheezing CV: had chest pain, no palpitations GI: no nausea, vomiting, abdominal pain, diarrhea, constipation GU: no dysuria, burning on urination, increased urinary frequency, hematuria  Ext: no leg edema Neuro: No vision change or hearing loss. Has left-sided weakness and left facial droop Skin: no rash, no skin tear. MSK: No muscle spasm, no deformity, no limitation of range of movement in spin Heme: No easy bruising.  Travel history: No recent long distant travel.  Allergy:  Allergies  Allergen Reactions  . Acetaminophen Itching, Nausea And Vomiting and Rash    Past Medical History:  Diagnosis Date  . Anxiety   . Depression   . GERD (gastroesophageal reflux disease)   . Obesity   . Stroke ([place])   . Substance abuse [facility])     Past Surgical History:  Procedure Laterality Date  . ESOPHAGOGASTRODUODENOSCOPY (EGD) WITH PROPOFOL N/A 09/20/2017   Procedure: ESOPHAGOGASTRODUODENOSCOPY (EGD) WITH PROPOFOL;  Surgeon: Lin Landsman, MD;  Location: [place];  Service: Gastroenterology;  Laterality: N/A;  . KNEE SURGERY Right     Social History:  reports that he has been smoking cigarettes. He has been smoking about 0.50 packs per day. He has never used smokeless tobacco. He reports previous alcohol use. He reports current drug use. Drug: Cocaine.  Family History:  Family History  Problem Relation  Age of Onset  . Vasculitis Mother      Prior to Admission medications   Medication Sig Start Date End Date Taking? Authorizing Provider  aspirin EC 81 MG tablet Take 81 mg by mouth daily. Swallow whole.   Yes [provider]  atorvastatin (LIPITOR) 80 MG tablet Take 80 mg by mouth every evening.   Yes  [provider]  citalopram (CELEXA) 10 MG tablet Take 1 tablet (10 mg total) by mouth daily. Patient taking differently: Take 20 mg by mouth daily. 03/30/20  Yes Rankin, Shuvon B, NP  diclofenac Sodium (VOLTAREN) 1 % GEL Apply 4 g topically 4 (four) times daily.   Yes [provider]  famotidine (PEPCID) 20 MG tablet Take 20 mg by mouth 2 (two) times daily.   Yes [provider]  gabapentin (NEURONTIN) 400 MG capsule Take 400 mg by mouth 3 (three) times daily.   Yes [provider]  lidocaine (LIDODERM) 5 % Place 1 patch onto the skin daily. Remove & Discard patch within 12 hours or as directed by MD   Yes [provider]  lisinopril (ZESTRIL) 5 MG tablet Take 5 mg by mouth daily.   Yes [provider]  magnesium oxide (MAG-OX) 400 MG tablet Take 400 mg by mouth daily.   Yes [provider]  oxycodone (OXY-IR) 5 MG capsule Take 5 mg by mouth every 4 (four) hours as needed.   Yes [provider]  pantoprazole (PROTONIX) 40 MG tablet Take 1 tablet (40 mg total) by mouth daily. 05/26/20 05/26/21 Yes Fransico Meadow, PA-C  Riboflavin 100 MG CAPS Take by mouth.   Yes [provider]  tiZANidine (ZANAFLEX) 2 MG tablet Take 2 mg by mouth every 8 (eight) hours as needed for muscle spasms.   Yes [provider]  QUEtiapine (SEROQUEL) 25 MG tablet Take 1 tablet (25 mg total) by mouth 2 (two) times daily. Patient not taking: No sig reported 03/30/20   Rankin, Shuvon B, NP  QUEtiapine (SEROQUEL) 300 MG tablet Take 1 tablet (300 mg total) by mouth at bedtime. Patient not taking: No sig reported 03/30/20   Derrill Center, NP  omeprazole (PRILOSEC OTC) 20 MG tablet Take 2 tablets (40 mg total) by mouth 2 (two) times daily before a meal. 09/19/17 01/29/19  Lin Landsman, MD    Physical Exam: Vitals:   06/05/20 0630 06/05/20 0645 06/05/20 0700 06/05/20 0813  BP: (!) 143/68     Pulse: (!) 59 (!) 54 (!) 59   Resp: 13 17 15     Temp:      TempSrc:      SpO2: 99% 100% 100%   Weight:    99.8 kg   General: Not in acute distress HEENT:       Eyes: PERRL, EOMI, no scleral icterus.       ENT: No discharge from the ears and nose, no pharynx injection, no tonsillar enlargement.        Neck: No JVD, no bruit, no mass felt. Heme: No neck lymph node enlargement. Cardiac: S1/S2, RRR, No murmurs, No gallops or rubs. Respiratory: No rales, wheezing, rhonchi or rubs. GI: Soft, nondistended, nontender, no rebound pain, no organomegaly, BS present. GU: No hematuria Ext: No pitting leg edema bilaterally. 1+DP/PT pulse bilaterally. Musculoskeletal: No joint deformities, No joint redness or warmth, no limitation of ROM in spin. Skin: No rashes.  Neuro: Alert, oriented X3, cranial nerves II-XII grossly intact except for left facial droop. Muscle  strength 4/5 in left arm and leg and 5/5 in right extremities. Psych: Patient is not psychotic, no suicidal or hemocidal ideation.  Labs on Admission: I have personally reviewed following labs and imaging studies  CBC: Recent Labs  Lab 06/05/20 0426  WBC 7.6  NEUTROABS 4.7  HGB 13.3  HCT 42.0  MCV 81.1  PLT 096   Basic Metabolic Panel: Recent Labs  Lab 06/05/20 0426  NA 142  K 3.6  CL 108  CO2 23  GLUCOSE 99  BUN 19  CREATININE 0.73  CALCIUM 9.1   GFR: Estimated Creatinine Clearance: 125.1 mL/min (by C-G formula based on SCr of 0.73 mg/dL). Liver Function Tests: Recent Labs  Lab 06/05/20 0426  AST 28  ALT 24  ALKPHOS 94  BILITOT 0.8  PROT 8.4*  ALBUMIN 4.3   No results for input(s): LIPASE, AMYLASE in the last 168 hours. No results for input(s): AMMONIA in the last 168 hours. Coagulation Profile: Recent Labs  Lab 06/05/20 0426  INR 1.1   Cardiac Enzymes: No results for input(s): CKTOTAL, CKMB, CKMBINDEX, TROPONINI in the last 168 hours. BNP (last 3 results) No results for input(s): PROBNP in the last 8760 hours. HbA1C: No results for input(s):  HGBA1C in the last 72 hours. CBG: No results for input(s): GLUCAP in the last 168 hours. Lipid Profile: No results for input(s): CHOL, HDL, LDLCALC, TRIG, CHOLHDL, LDLDIRECT in the last 72 hours. Thyroid Function Tests: No results for input(s): TSH, T4TOTAL, FREET4, T3FREE, THYROIDAB in the last 72 hours. Anemia Panel: No results for input(s): VITAMINB12, FOLATE, FERRITIN, TIBC, IRON, RETICCTPCT in the last 72 hours. Urine analysis:    Component Value Date/Time   COLORURINE YELLOW (A) 06/05/2020 0619   APPEARANCEUR HAZY (A) 06/05/2020 0619   LABSPEC >1.046 (H) 06/05/2020 0619   PHURINE 5.0 06/05/2020 0619   GLUCOSEU NEGATIVE 06/05/2020 0619   HGBUR SMALL (A) 06/05/2020 0619   BILIRUBINUR NEGATIVE 06/05/2020 0619   KETONESUR 20 (A) 06/05/2020 0619   PROTEINUR NEGATIVE 06/05/2020 0619   NITRITE NEGATIVE 06/05/2020 0619   LEUKOCYTESUR NEGATIVE 06/05/2020 0619   Sepsis Labs: @LABRCNTIP (procalcitonin:4,lacticidven:4) ) Recent Results (from the past 240 hour(s))  SARS CORONAVIRUS 2 (TAT 6-24 HRS) Nasopharyngeal Nasopharyngeal Swab     Status: Abnormal   Collection Time: 05/26/20  4:15 PM   Specimen: Nasopharyngeal Swab  Result Value Ref Range Status   SARS Coronavirus 2 POSITIVE (A) NEGATIVE Final    Comment: (NOTE) SARS-CoV-2 target nucleic acids are DETECTED.  The SARS-CoV-2 RNA is generally detectable in upper and lower respiratory specimens during the acute phase of infection. Positive results are indicative of the presence of SARS-CoV-2 RNA. Clinical correlation with patient history and other diagnostic information is  necessary to determine patient infection status. Positive results do not rule out bacterial infection or co-infection with other viruses.  The expected result is Negative.  Fact Sheet for Patients: SugarRoll.be  Fact Sheet for Healthcare Providers: https://www.woods-mathews.com/  This test is not yet approved or  cleared by the [place] FDA and  has been authorized for detection and/or diagnosis of SARS-CoV-2 by FDA under an Emergency Use Authorization (EUA). This EUA will remain  in effect (meaning this test can be used) for the duration of the COVID-19 declaration under Section 564(b)(1) of the Act, 21 U. S.C. section 360bbb-3(b)(1), unless the authorization is terminated or revoked sooner.   Performed at [facility], [place] [address] [place], [place] [address]      Radiological Exams on  Admission: CT Angio Head W or Wo Contrast  Result Date: 06/05/2020 CLINICAL DATA:  60 year old male code stroke presentation. Left side weakness. EXAM: CT ANGIOGRAPHY HEAD AND NECK CT PERFUSION BRAIN TECHNIQUE: Multidetector CT imaging of the head and neck was performed using the standard protocol during bolus administration of intravenous contrast. Multiplanar CT image reconstructions and MIPs were obtained to evaluate the vascular anatomy. Carotid stenosis measurements (when applicable) are obtained utilizing NASCET criteria, using the distal internal carotid diameter as the denominator. Multiphase CT imaging of the brain was performed following IV bolus contrast injection. Subsequent parametric perfusion maps were calculated using RAPID software. CONTRAST:  168mL OMNIPAQUE IOHEXOL 350 MG/ML SOLN COMPARISON:  Head CT 0413 hours, brain MRI 0528 hours today. FINDINGS: CT Brain Perfusion Findings: ASPECTS: 10 CBF (<30%) Volume: None Perfusion (Tmax>6.0s) volume: None Mismatch Volume: Not applicable Infarction Location:Not applicable CTA NECK Skeleton: Intermittent carious dentition. Lower cervical spine disc and endplate degeneration. No acute osseous abnormality identified. Upper chest: Negative. Other neck: Within normal limits. Aortic arch: 3 vessel arch configuration.  No arch atherosclerosis. Right carotid system: Negative. Left carotid system: Negative. Vertebral arteries: Normal proximal right subclavian  artery and right vertebral artery origin. Right vertebral is patent and within normal limits to the skull base. Normal proximal left subclavian artery. Normal left vertebral artery origin. Left vertebral is codominant, patent and within normal limits to the skull base. CTA HEAD Posterior circulation: Distal vertebral arteries are patent to the basilar without plaque or stenosis. Normal PICA origins. Patent basilar artery with mild tortuosity. Patent SCA and PCA origins. Posterior communicating arteries are diminutive or absent. Bilateral PCA branches are within normal limits, tortuosity greater on the left. Anterior circulation: Both ICA siphons are patent without plaque or stenosis. Patent carotid termini. Normal MCA and ACA origins. Mildly dominant left A1. Normal anterior communicating artery. Bilateral ACA branches are within normal limits, the right is mildly dominant. Left MCA M1 segment and bifurcation are patent without stenosis. Left MCA branches are within normal limits. Right MCA M1 and bifurcation are patent without stenosis. Right MCA branches are within normal limits. Venous sinuses: Patent. Anatomic variants: None significant. Review of the MIP images confirms the above findings IMPRESSION: 1. CTA Head and Neck and CT Perfusion are negative. No evidence of ischemia.  No arterial abnormality identified. 2. Intermittent dental caries. Lower cervical spine disc and endplate degeneration. Electronically Signed   By: Genevie Ann M.D.   On: 06/05/2020 06:11   CT Angio Neck W and/or Wo Contrast  Result Date: 06/05/2020 CLINICAL DATA:  60 year old male code stroke presentation. Left side weakness. EXAM: CT ANGIOGRAPHY HEAD AND NECK CT PERFUSION BRAIN TECHNIQUE: Multidetector CT imaging of the head and neck was performed using the standard protocol during bolus administration of intravenous contrast. Multiplanar CT image reconstructions and MIPs were obtained to evaluate the vascular anatomy. Carotid stenosis  measurements (when applicable) are obtained utilizing NASCET criteria, using the distal internal carotid diameter as the denominator. Multiphase CT imaging of the brain was performed following IV bolus contrast injection. Subsequent parametric perfusion maps were calculated using RAPID software. CONTRAST:  185mL OMNIPAQUE IOHEXOL 350 MG/ML SOLN COMPARISON:  Head CT 0413 hours, brain MRI 0528 hours today. FINDINGS: CT Brain Perfusion Findings: ASPECTS: 10 CBF (<30%) Volume: None Perfusion (Tmax>6.0s) volume: None Mismatch Volume: Not applicable Infarction Location:Not applicable CTA NECK Skeleton: Intermittent carious dentition. Lower cervical spine disc and endplate degeneration. No acute osseous abnormality identified. Upper chest: Negative. Other neck: Within normal limits. Aortic arch:  3 vessel arch configuration.  No arch atherosclerosis. Right carotid system: Negative. Left carotid system: Negative. Vertebral arteries: Normal proximal right subclavian artery and right vertebral artery origin. Right vertebral is patent and within normal limits to the skull base. Normal proximal left subclavian artery. Normal left vertebral artery origin. Left vertebral is codominant, patent and within normal limits to the skull base. CTA HEAD Posterior circulation: Distal vertebral arteries are patent to the basilar without plaque or stenosis. Normal PICA origins. Patent basilar artery with mild tortuosity. Patent SCA and PCA origins. Posterior communicating arteries are diminutive or absent. Bilateral PCA branches are within normal limits, tortuosity greater on the left. Anterior circulation: Both ICA siphons are patent without plaque or stenosis. Patent carotid termini. Normal MCA and ACA origins. Mildly dominant left A1. Normal anterior communicating artery. Bilateral ACA branches are within normal limits, the right is mildly dominant. Left MCA M1 segment and bifurcation are patent without stenosis. Left MCA branches are  within normal limits. Right MCA M1 and bifurcation are patent without stenosis. Right MCA branches are within normal limits. Venous sinuses: Patent. Anatomic variants: None significant. Review of the MIP images confirms the above findings IMPRESSION: 1. CTA Head and Neck and CT Perfusion are negative. No evidence of ischemia.  No arterial abnormality identified. 2. Intermittent dental caries. Lower cervical spine disc and endplate degeneration. Electronically Signed   By: Genevie Ann M.D.   On: 06/05/2020 06:11   MR BRAIN WO CONTRAST  Result Date: 06/05/2020 CLINICAL DATA:  60 year old male code stroke presentation. Sudden onset left side weakness. EXAM: MRI HEAD WITHOUT CONTRAST TECHNIQUE: Multiplanar, multiecho pulse sequences of the brain and surrounding structures were obtained without intravenous contrast. COMPARISON:  Brain MRI 04/01/2020.  Head CT 0413 hours. FINDINGS: Brain: No restricted diffusion to suggest acute infarction. No midline shift, mass effect, evidence of mass lesion, ventriculomegaly, extra-axial collection or acute intracranial hemorrhage. Cervicomedullary junction and pituitary are within normal limits. Cerebral volume remains normal. [place] C[place] and white matter signal is stable and normal. No encephalomalacia or chronic cerebral blood products identified. There is a small lipoma of the left quadrigeminal plate cistern (series 16, image 58 and series 13, image 21, with confirmed macroscopic fat on earlier CT. Vascular: Major intracranial vascular flow voids are stable. Skull and upper cervical spine: Stable, negative. Sinuses/Orbits: Stable, negative; minor mucosal thickening. Other: Mastoids remain well aerated. Grossly normal visible internal auditory structures. Small benign right nasopharyngeal retention cysts. Visible scalp and face appear negative. IMPRESSION: Stable and normal noncontrast MRI appearance of the brain. No acute intracranial abnormality. 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 Electronically Signed   By: Genevie Ann M.D.   On: 06/05/2020 05:55   CT CEREBRAL PERFUSION W CONTRAST  Result Date: 06/05/2020 CLINICAL DATA:  60 year old male code stroke presentation. Left side weakness. EXAM: CT ANGIOGRAPHY HEAD AND NECK CT PERFUSION BRAIN TECHNIQUE: Multidetector CT imaging of the head and neck was performed using the standard protocol during bolus administration of intravenous contrast. Multiplanar CT image reconstructions and MIPs were obtained to evaluate the vascular anatomy. Carotid stenosis measurements (when applicable) are obtained utilizing NASCET criteria, using the distal internal carotid diameter as the denominator. Multiphase CT imaging of the brain was performed following IV bolus contrast injection. Subsequent parametric perfusion maps were calculated using RAPID software. CONTRAST:  148mL OMNIPAQUE IOHEXOL 350 MG/ML SOLN COMPARISON:  Head CT 0413 hours, brain MRI 0528 hours today. FINDINGS: CT Brain Perfusion Findings: ASPECTS: 10 CBF (<30%)  Volume: None Perfusion (Tmax>6.0s) volume: None Mismatch Volume: Not applicable Infarction Location:Not applicable CTA NECK Skeleton: Intermittent carious dentition. Lower cervical spine disc and endplate degeneration. No acute osseous abnormality identified. Upper chest: Negative. Other neck: Within normal limits. Aortic arch: 3 vessel arch configuration.  No arch atherosclerosis. Right carotid system: Negative. Left carotid system: Negative. Vertebral arteries: Normal proximal right subclavian artery and right vertebral artery origin. Right vertebral is patent and within normal limits to the skull base. Normal proximal left subclavian artery. Normal left vertebral artery origin. Left vertebral is codominant, patent and within normal limits to the skull base. CTA HEAD Posterior circulation: Distal vertebral arteries are patent to the basilar without plaque or stenosis. Normal PICA origins.  Patent basilar artery with mild tortuosity. Patent SCA and PCA origins. Posterior communicating arteries are diminutive or absent. Bilateral PCA branches are within normal limits, tortuosity greater on the left. Anterior circulation: Both ICA siphons are patent without plaque or stenosis. Patent carotid termini. Normal MCA and ACA origins. Mildly dominant left A1. Normal anterior communicating artery. Bilateral ACA branches are within normal limits, the right is mildly dominant. Left MCA M1 segment and bifurcation are patent without stenosis. Left MCA branches are within normal limits. Right MCA M1 and bifurcation are patent without stenosis. Right MCA branches are within normal limits. Venous sinuses: Patent. Anatomic variants: None significant. Review of the MIP images confirms the above findings IMPRESSION: 1. CTA Head and Neck and CT Perfusion are negative. No evidence of ischemia.  No arterial abnormality identified. 2. Intermittent dental caries. Lower cervical spine disc and endplate degeneration. Electronically Signed   By: Genevie Ann M.D.   On: 06/05/2020 06:11   CT HEAD CODE STROKE WO CONTRAST  Result Date: 06/05/2020 CLINICAL DATA:  Code stroke. 60 year old male with sudden onset left side weakness, inability to walk. EXAM: CT HEAD WITHOUT CONTRAST TECHNIQUE: Contiguous axial images were obtained from the base of the skull through the vertex without intravenous contrast. COMPARISON:  Brain MRI 06/03/2019.  Head CT 06/03/2019. FINDINGS: Brain: Cerebral volume remains normal. No midline shift, ventriculomegaly, mass effect, evidence of mass lesion, intracranial hemorrhage or evidence of cortically based acute infarction. Gray-white matter differentiation is within normal limits throughout the brain. Vascular: No suspicious intracranial vascular hyperdensity. Skull: No acute osseous abnormality identified. Sinuses/Orbits: Regressed ethmoid sinus mucosal thickening since December. Other Visualized paranasal  sinuses and mastoids are stable and well pneumatized. Other: Stable orbit and scalp soft tissues. ASPECTS [facility] Stroke Program Early CT Score) Total score (0-10 with 10 being normal): 10 IMPRESSION: Stable and normal noncontrast CT appearance of the brain. ASPECTS 10. Electronically Signed   By: Genevie Ann M.D.   On: 06/05/2020 04:26     EKG: I have personally reviewed.  Not done in ED, will get one.   Assessment/Plan Principal Problem:   Left-sided weakness Active Problems:   Gastroesophageal reflux disease with esophagitis   Cocaine abuse (HCC)   TIA (transient ischemic attack)   Stroke (HCC)   Depression   HTN (hypertension)   HLD (hyperlipidemia)   Tobacco abuse   Chest pain   COVID-19 virus infection   Left-sided weakness:  CT-head, MRI of brain, CTA of head and neck, and CT of cerebral perfusion are all negative for acute abnormalities. Tele neurology, Dr. Humberto Seals was consulted by EDP and recommended admission.  I also consulted Dr. Curly Shores of our neurologist.  Potential differential diagnosis include TIA, cocaine abuse and malingering.   - Placed on MedSurg bed for  observation - will follow up Neurology's Recs.  - continue ASA and lipitor - fasting lipid panel and HbA1c  - 2D transthoracic echocardiography  - swallowing screen. If fails, will get SLP - PT/OT consult  COVID-19 virus infection: Patient had a positive Covid test on 05/26/2020.  Patient only has mild dry cough, no shortness breath, fever, chills.  Oxygen saturation sat 97-100% on room air.  No specific treatment needed. -As needed albuterol and Mucinex -Follow-up chest x-ray which is for chest pain  Chest pain: Had 1 episode of chest pain which has resolved.  Troponin level 10x2.  Currently no chest pain or shortness breath.  Possibly due to cocaine abuse -Continue aspirin, Lipitor  Cocaine abuse: --Did counseling about importance of quitting cocaine  Gastroesophageal reflux disease with  esophagitis -Protonix and Pepcid  Hx of TIA (transient ischemic attack) and stroke -ASA and lipitor  Depression -continue home meds: Celexa  HTN (hypertension) -IV hydralazine as needed -Lisinopril  Addendum: RN reports that pt's Bp is soft in PM, 89/50 -->97/58. No signs of infection -will hold lisinopril and hydralazine -IV fluid: 1 L normal saline, followed by 75 cc/h  HLD (hyperlipidemia) -Lipitor  Tobacco abuse -Nicotine patch  Gamma gap: Dr. Curly Shores has noticed this issue. Protein 8.4 and albumin 4.3. Etiology is not clear. -Screening for hepatitis C, HIV were ordered by Dr. Honor Junes - f/u with PCP    DVT ppx: SQ Lovenox Code Status: Full code Family Communication: not done, no family member is at bed side. Disposition Plan:  Anticipate discharge back to previous environment Consults called:  Dr. Curly Shores Admission status and Level of care: Med-Surg:    Med-surg bed for obs      Status is: Observation  The patient remains OBS appropriate and will d/c before 2 midnights.  Dispo: The patient is from: Home              Anticipated d/c is to: Home              Anticipated d/c date is: 1 day              Patient currently is not medically stable to d/c.   Difficult to place patient No          Date of Service 06/05/2020    Ivor Costa [facility]   If 7PM-7AM, please contact night-coverage [email or web address] 06/05/2020, 8:58 AM

## 2020-06-05 NOTE — ED Notes (Signed)
Pt resting at this time, states he is "paralyzed on his left side". Pt moving hands and feet on left side. Grip slightly weaker on left.  Alert and oriented

## 2020-06-05 NOTE — ED Triage Notes (Signed)
Pt from ems, was walking to [employer] when suddenly had inablility to ambulate and left sided weakness. Ems called code stroke, pt cleared by dr. Tamala Julian in [place] and to ct scan. Pt with weakness to left arm and leg. Pt states has had cva last year that left him with left sided weakness.

## 2020-06-05 NOTE — ED Notes (Addendum)
Pt visualized resting comfortably in bed with eyes closed at this time, RR even and unlabored, NAD noted. TV on and lights dimmed for pt comfort at this time.

## 2020-06-05 NOTE — ED Notes (Signed)
Pt requesting pain medication for left side "burning." Pt sts pain runs down neck and down left arm. RN and MD made aware of pts request.

## 2020-06-05 NOTE — Consult Note (Signed)
[facility] Tele[facility]urology Consult Services   Date of Service:   06/05/2020 04:17:53  Diagnosis:     .  R53.1 - Weakness  Impression: 60 year old male with a past medical history of stroke on December 9th with residual left sided weakness presents with worsening left sided weakness that started at 2:30 AM when he was driving his car. DDX recommend admission for stroke work up.  Metrics: Last Known Well: 06/05/2020 02:30:00 [facility] Notification Time: 06/05/2020 04:17:53 Arrival Time: 06/05/2020 04:06:00 Stamp Time: 06/05/2020 04:17:53 Initial Response Time: 06/05/2020 04:19:13 Symptoms: Worsening left sided weakness. Marland Kitchen NIHSS Start Assessment Time: 06/05/2020 04:20:00 Thrombolytic Early Mix Decision Time: 06/05/2020 04:36:13 Patient is not a candidate for Thrombolytic. Thrombolytic Medical Decision: 06/05/2020 04:36:14 Patient was not deemed candidate for Thrombolytic because of following reasons: Stroke in last 3 months.  CT head showed no acute hemorrhage or acute core infarct.  ED Physician notified of diagnostic impression and management plan on 06/05/2020 04:55:33  Advanced Imaging: CTA Head and Neck Completed.  LVO:No   Our recommendations are outlined below.  Recommendations:      .  Stroke/Telemetry Floor     .  Neuro Checks     .  Bedside Swallow Eval     .  DVT Prophylaxis     .  IV Fluids, Normal Saline     .  Head of Bed 30 Degrees     .  Euglycemia and Avoid Hyperthermia (PRN Acetaminophen)     .  Initiate Aspirin 81 MG Daily    Sign Out:     .  Discussed with Emergency Department Provider    ------------------------------------------------------------------------------  History of Present Illness: Patient is a 60 year old Male.  Patient was brought by EMS for symptoms of Worsening left sided weakness. .  60 year old male with a past medical history of stroke on December 9th with residual left sided weakness presents  with worsening left sided weakness that started at 2:30 AM when he was driving his car. NIHHS is 8. Not a IV alteplase candidate secondary to stroke in last 3 months.    Past Medical History:     . Stroke     . There is NO history of Hypertension     . There is NO history of Diabetes Mellitus     . There is NO history of Hyperlipidemia     . There is NO history of Coronary Artery Disease  Anticoagulant use:  Unknown  Antiplatelet use: Unknown  Allergies:  Reviewed     Examination: BP(174/100), Pulse(64), Blood Glucose(Pending. ) 1A: Level of Consciousness - Alert; keenly responsive + 0 1B: Ask Month and Age - Both Questions Right + 0 1C: Blink Eyes & Squeeze Hands - Performs Both Tasks + 0 2: Test Horizontal Extraocular Movements - Normal + 0 3: Test Visual Fields - No Visual Loss + 0 4: Test Facial Palsy (Use Grimace if Obtunded) - Unilateral Complete paralysis (upper/lower face) + 3 5A: Test Left Arm Motor Drift - Drift, but doesn't hit bed + 1 5B: Test Right Arm Motor Drift - No Drift for 10 Seconds + 0 6A: Test Left Leg Motor Drift - No Effort Against Gravity + 3 6B: Test Right Leg Motor Drift - No Drift for 5 Seconds + 0 7: Test Limb Ataxia (FNF/Heel-Shin) - No Ataxia + 0 8: Test Sensation - Mild-Moderate Loss: Less Sharp/More Dull + 1 9: Test Language/Aphasia - Normal; No aphasia + 0 10: Test Dysarthria -  Normal + 0 11: Test Extinction/Inattention - No abnormality + 0  NIHSS Score: 8  Pre-Morbid Modified Rankin Scale: 0 Points = No symptoms at all   Patient/Family was informed the Neurology Consult would occur via TeleHealth consult by way of interactive audio and video telecommunications and consented to receiving care in this manner.   Patient is being evaluated for possible acute neurologic impairment and high probability of imminent or life-threatening deterioration. I spent total of 30 minutes providing care to this patient, including time for face to face  visit via telemedicine, review of medical records, imaging studies and discussion of findings with providers, the patient and/or family.   Dr Jessica Priest   [facility] [phone]  Case [id number]

## 2020-06-05 NOTE — ED Notes (Addendum)
Pt ambulatory into hallway and around room independently demonstrating a steady gait.

## 2020-06-05 NOTE — Consult Note (Signed)
Neurology Consultation Reason for Consult: Left-sided weakness/paresthesias Requesting Physician: Ivor Costa  CC: Left sided weakness   History is obtained from: Patient and chart review  HPI: Samuel Molina is a 60 y.o. male with a past medical history significant for hypertension, ongoing tobacco and cocaine abuse, obesity, substance induced mood disorder, and musculoskeletal pain.  Per teleneurology evaluation, he presented with worsening left-sided weakness that started acutely at 2:30 AM while he was driving his car.  His NIH stroke scale was 8 (1 for left arm drift, 3 for left leg drift, 1 for mild loss of sensation, 3 for complete paralysis of the left side of the face).  Per nursing notes, overnight he complained of left-sided burning pain down his neck and left arm, and his grip strength was slightly weaker on the left.  He was also verbally aggressive with nursing.  On chart review, with key details personally verified with patient, he initially presented on 04/01/2020 to [facility] with left facial droop, and some complaints of intermittent numbness of the left arm and leg.  He was not a TPA candidate due to presenting out of the window.  MRI brain was negative for stroke and months the diagnosis of Bell's palsy was made and he was discharged with steroids, antivirals, and erythromycin eye ointment.  However he then represented on 04/05/2020 to [place] with left-sided weakness (left face and leg greater than arm weakness and numbness including the left upper face with difficulty fully closing his eye), which per chart review he reported with sudden onset at 6:45 AM on the day of admission, for which he received tPA.  Head CT, CTA, and CTP were negative but MRI brain does not appear to have been completed during that admission. ECHO (04/06/2020) revealed a mildly dilated left atrium, EF 55%, normal right ventricular function, negative bubble study, with a lipid panel Lipid panel with an LDL  of 103, A1c 5.4.  He was started on aspirin and atorvastatin. At discharge his exam was notable for a left facial droop, mild left upper and left lower extremity weakness (4+/5), right sided limp, with a total discharge NIHSS of 5 and a modified Rankin scale of 2. He then represented to [facility] ED on 04/19/2020 with left-sided weakness felt to be a complicated migraine versus recrudescence in the setting of cocaine use; 12/27 MRI brain without contrast was notable only for chronic left inferior colliculus bleed.   Additionally, he was evaluated by psychiatry at [facility] on 03/25/2020.  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  They noted chronic suicidal ideation without clear plan, multiple presentations with the same complaint ("taking 20 pills belonging to somebody else"), with notably no evidence on clinical evaluation and laboratory analysis of overdose.  They also noted that on prior psychiatric admissions he did not engage in therapy and resisted treatment plans.  He reports to me that he has started to engage in [place] since his stroke and has a sponsor as well as an outpatient therapist.  Additionally he reports that he last used cocaine 3 days ago in the setting of depressive thoughts and feeling sad about his facial asymmetry, and that he had tried to reach his sponsor but his sponsor was unavailable at that time.  He wishes to continue with AA going forward.  On review of systems he reports frequent left-sided headaches that he states are new since his stroke-like  episode.  They are pulsating in nature and radiated into his neck and arm.  He initially reports that there is no change with activity but then reports they are light triggered, sound triggered as well as triggered by exertion.  He associates them with nausea, vomiting, light and sound  sensitivity.  He does have tearing in the left eye since the initial strokelike episode but does not associate this with headaches.  He also reports some sensation of reduced vision in the left eye.  He reports his headaches are not positional and improve with rest.  He is significantly concerned about the possibility of a vasculitis.  He reports that this was his mother's diagnosis, that she was otherwise healthy but had several TIAs from which she recovered fully until she had a "massive stroke" and then she had similar symptoms of headache and left-sided weakness.  He reports her name is Samuel Molina and her date of birth is 08/04/1933, and that she got her care through [facility] and was eventually diagnosed with vasculitis on her brain biopsy. I am unable to locate records for this patient within our EMR.   On review of systems, he additionally notes some shortness of breath, neck pain, dysphagia and dysarthria that immediately followed his strokelike episode which have been improving, he does snore but had an obstructive sleep apnea evaluation 1.5 years ago.  He reports a weight loss of 50 pounds since July due to not keeping food down / chronic hiccups / GERD which was evaluated by endoscopy which was unrevealing.  In our records, he had an endoscopy on 08/2017 which was unrevealing other than evidence of some reflux.  (Weight documented at 123 kg on 09/20/2017, 95.3 kg on 10/20/2019, 99.8 kg this admission).  He reports one episode of a large amount of blood in his stool 2 weeks ago that has since resolved.  He is also reporting some skin lesions that he describes as hard areas, showing me multiple spots with evidence of excoriation that he reports started 2 to 3 weeks ago.  He also reports cold intolerance since his strokelike episode without fever/chills/sweats.    LKW: 06/05/2020 02:30:00 tPA given?: No, due to stroke within the last 3 months IA performed?: No, due to no LVO Premorbid modified  rankin scale:      1 - No significant disability. Able to carry out all usual activities, despite some symptoms.     2 - Slight disability. Able to look after own affairs without assistance, but unable to carry out all previous activities.  ROS: A 14 point ROS was performed and is negative except as noted in the HPI.    Past Medical History:  Diagnosis Date  . Anxiety   . Depression   . GERD (gastroesophageal reflux disease)   . Obesity   . Stroke ([place])   . Substance abuse [facility])    Past Surgical History:  Procedure Laterality Date  . ESOPHAGOGASTRODUODENOSCOPY (EGD) WITH PROPOFOL N/A 09/20/2017   Procedure: ESOPHAGOGASTRODUODENOSCOPY (EGD) WITH PROPOFOL;  Surgeon: Lin Landsman, MD;  Location: [facility];  Service: Gastroenterology;  Laterality: N/A;  . KNEE SURGERY Right     Family History  Problem Relation Age of Onset  . Vasculitis Mother   He reports that there is an extensive history of cancer on his mother's side. Reports his sister (currently 68 years old) is healthy, as is his son (age 35) and daughter (age 70)  Current Meds  Medication Sig  . aspirin EC  81 MG tablet Take 81 mg by mouth daily. Swallow whole.  Marland Kitchen atorvastatin (LIPITOR) 80 MG tablet Take 80 mg by mouth every evening.  . citalopram (CELEXA) 10 MG tablet Take 1 tablet (10 mg total) by mouth daily. (Patient taking differently: Take 20 mg by mouth daily.)  . diclofenac Sodium (VOLTAREN) 1 % GEL Apply 4 g topically 4 (four) times daily.  . famotidine (PEPCID) 20 MG tablet Take 20 mg by mouth 2 (two) times daily.  Marland Kitchen gabapentin (NEURONTIN) 400 MG capsule Take 400 mg by mouth 3 (three) times daily.  Marland Kitchen lidocaine (LIDODERM) 5 % Place 1 patch onto the skin daily. Remove & Discard patch within 12 hours or as directed by MD  . lisinopril (ZESTRIL) 5 MG tablet Take 5 mg by mouth daily.  . magnesium oxide (MAG-OX) 400 MG tablet Take 400 mg by mouth daily.  Marland Kitchen oxycodone (OXY-IR) 5 MG capsule Take 5 mg by mouth  every 4 (four) hours as needed.  . pantoprazole (PROTONIX) 40 MG tablet Take 1 tablet (40 mg total) by mouth daily.  . Riboflavin 100 MG CAPS Take by mouth.  Marland Kitchen tiZANidine (ZANAFLEX) 2 MG tablet Take 2 mg by mouth every 8 (eight) hours as needed for muscle spasms.   Social History:  reports that he has been smoking cigarettes. He has been smoking about 0.50 packs per day. He has never used smokeless tobacco. He reports previous alcohol use. He reports current drug use. Drug: Cocaine.  As detailed above he is currently engaged in alcoholics anonymous and reports he has an outpatient therapist with intentions to quit cocaine and smoking.  He reports he lives alone but has a good relationship with both of his children and feels that he has a good job working at [employer] (works third shift).  He reports last night he was leaving work early as the lines had shut down.  He states the death of his mother in 07/16/15 was quite challenging and he started writing poetry to cope with that.  He reports that his poem honoring his mother won second place in a creative writing competition and that [employer] offered to buy it in Oct 2021.  He is able to recite this sweet poem to me clearly, from memory.    Exam: Current vital signs: BP (!) 143/68   Pulse (!) 59   Temp 98.1 F (36.7 C) (Oral)   Resp 15   Wt 99.8 kg   SpO2 100%   BMI 29.03 kg/m  Vital signs in last 24 hours: Temp:  [98.1 F (36.7 C)] 98.1 F (36.7 C) (02/12 0449) Pulse Rate:  [54-64] 59 (02/12 0700) Resp:  [13-20] 15 (02/12 0700) BP: (140-152)/(68-92) 143/68 (02/12 0630) SpO2:  [97 %-100 %] 100 % (02/12 0700) Weight:  [99.8 kg] 99.8 kg (02/12 0813)   Physical Exam  Constitutional: Appears well-developed and well-nourished.  Psych: Affect appropriate to situation Eyes: No scleral injection HENT: No OP obstrucion MSK: no joint deformities.  Cardiovascular: Normal rate and regular rhythm.  Respiratory: Effort normal, non-labored  breathing GI: Soft.  No distension. There is no tenderness.  Skin: WDI  Neuro: Mental Status: Patient is awake, alert, oriented to person, place, month, year, and situation. Patient is able to give a clear and coherent history. No signs of aphasia or neglect, stuttering speech secondary to frequent hiccups Cranial Nerves: II: Visual Fields are full. Pupils are equal, round, and reactive to light.  Funduscopic examination reveals crisp disks bilaterally.  III,IV, VI:  EOMI, with weak left eye closure V: Facial sensation is reduced on the left VII: Facial movement is notable for a complete facial droop on the left VIII: hearing is intact to voice X: Uvula elevates symmetrically XI: Shoulder shrug is at least 4/5 on the left, head turn is notably strong to the right but weak to the left XII: tongue is midline without atrophy or fasciculations.  Motor: Tone is normal.  Bulk bulk is normal.  5/5 throughout on the right side.  Pain/effort dependent on the left side, at least 4/5 throughout.  Keep both upper extremity slightly pronated.  Downward drift of the left upper extremity without additional pronation. Sensory: Reduced to light touch in the left face arm and leg Deep Tendon Reflexes: 2+ and symmetric in the biceps, brachioradialis, triceps, patella, Achilles.  Negative for clonus at the ankles bilaterally Plantars: Toes are downgoing bilaterally.  Cerebellar: FNF and HKS are intact bilaterally; within limits of effort/weakness on the left Gait: Able to rise on his heels and toes, though he intermittently sets down the left side he is able to bear full weight on it.  Reports he feels too unsafe to tandem.  Steady casual stance.  NIHSS total 6 Score breakdown:  3 for facial paresis, 1 for left upper extremity drift, 1 for left lower extremity drift, 1 for sensory loss on the left side  I have reviewed labs in epic and the results pertinent to this consultation are: U tox positive for  cocaine (positive on every check since 12/03/2019) Urine analysis negative for infection, small hemoglobin pigment Blood alcohol level undetectable CMP normal other than mildly elevated total protein (8.4, albumin 4.3, gamma gap elevated at 4.1), creatinine normal at 0.73 Troponin stable (10x2 checks) SARS-CoV-2 positive on 05/26/2020 by PCR   I have reviewed the images obtained:  MRI brain with left dorsal midbrain SWI blooming artifact, T1 bright, stable from April 01, 2020 (personally reviewed), no acute infarct    Radiology notes that this SWI artifact correlates with an area 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  His examination is notable for a complete facial droop on the left, giveaway weakness on the left head/arm/leg (including weak head turn to the left but not to the right, opposite to what would be expected with left sternocleidomastoid weakness; weakness on confrontational testing out of proportion to gait), symmetric normal reflexes.  I discussed with the patient that there is a significant functional overlay to his exam and personally reviewed his imaging with him.  Unfortunately he was fixated on the small hemorrhage/lipoma and dismissive of the fact that there could be a significant functional aspect to his left-sided weakness.  He was understandably upset and receiving different diagnoses at different hospitals (a stroke at [facility] versus Bell's palsy here).  Given his significant neck pain and CT with evidence of  disc/endplate degeneration, will additionally obtain an MRI cervical spine.  Given his concerns of vasculitis in his mother, biopsy-proven per his report, will additionally obtain MRI brain and cervical spine with contrast to rule out an inflammatory process,  as well as checking ESR and CRP.  Given cocaine use, will additionally check CK to confirm he is not having rhabdomyolysis that is causing recrudescence of his possible prior stroke (though notably his MRI does not show clear evidence of a prior significant stroke).  He does also have elevated protein suggestive of a gamma gap, for which I will screen for hepatitis C and HIV, with further work-up per primary team.  Recommendations:  #Left sided weakness/numbness, significant functional overlay, initial event Bell's palsy versus brainstem stroke too small to be seen on MRI -MRI brain with contrast, thin cuts through the brainstem -MRI cervical spine with and without contrast -CK, 412 (ref range 49-397, essentially normal when adjusted for race) -ESR 25 (ref range 0-20 mm/hr, this slight elevation can be attributed to ongoing cocaine/tobacco use) -CRP -Continue counseling on ceasing cocaine and tobacco use, weight loss, diet, exercise  #Possible Bell's palsy -Suggested to patient that outpatient evaluation for potential facial reanimation surgery may be useful given his significant concerns about how the appearance of his face is contributing to his depression  #Gamma gap -Screening for hepatitis C, HIV given his high risk behaviors -Further evaluation of gamma gap elevation per primary team   #Chronic issues for outpatient follow-up -Skin changes (PCP) -Headaches, which sound potentially migrainous in nature (neurology) -Blurred vision with incomplete eye closure (ophthalmology)  Should work-up above be unrevealing, patient can continue outpatient follow-up and has expressed strong preference to continue being seen at [facility] MD-PhD [facility] [phone]   Addendum:  MRI brain w/ contrast, personally reviewed, left CN VII enhancement correlating to left sided complete facial droop.  Agree with radiology:  Asymmetrically prominent enhancement along the course of the left facial nerve, most notably within the left IAC fundus and at the level of the geniculate ganglion. Findings likely reflect sequela of Bell's palsy. However, close clinical follow-up is recommended (with follow-up imaging as warranted) to exclude alternative etiologies such as perineural tumor spread. No other sites no other sites of abnormal intracranial enhancement are identified.   MRI C-spine personally reviewed, agree with radiology:  IMPRESSION: 1. Very limited examination due to motion artifact. 2. Possible linear high T2 signal intensity from C2-3 to C4-5 could be a slightly prominent central canal or artifact. 3. Diffuse bulging annulus and osteophytic ridging at C4-5 with mild spinal and bilateral foraminal stenosis suspected. 4. Bulging annulus and osteophytic ridging at C5-6 with mild impression on the thecal sac but no significant spinal or foraminal stenosis. 5. Small central disc protrusion at C6-7 but no significant spinal or foraminal stenosis.

## 2020-06-05 NOTE — ED Notes (Signed)
Neuro at bedside.

## 2020-06-05 NOTE — Progress Notes (Signed)
PT Cancellation Note  Patient Details Name: Samuel Molina MRN: [id number] DOB: Feb 04, 1961   Cancelled Treatment:    Reason Eval/Treat Not Completed: Other (comment) Orders received, chart reviewed. Attempted to see pt but pt currently out of room to MRI. Will re-attempt as able & pending MRI results.  Lavone Nian, PT, DPT 06/05/20, 12:42 PM    [name] 06/05/2020, 12:42 PM

## 2020-06-05 NOTE — Progress Notes (Signed)
OT Cancellation Note  Patient Details Name: Samuel Molina MRN: [id number] DOB: [birth year]   Cancelled Treatment:    Reason Eval/Treat Not Completed: Order received, chart reviewed. Patient received in ED sleeping soundly, snoring loudly and did not wake to multiple loud greetings and mild-mod shaking. RN recommended allowing pt to sleep rather than using more aggressive measures to wake him, as he had been awake all night. Will attempt to evaluate pt at a later time/date, as appropriate.  Josiah Lobo, PhD, [place], OTR/L ascom [phone] 06/05/20, 4:02 PM

## 2020-06-05 NOTE — Progress Notes (Signed)
Pt not in room at this time, will follow up with pt upon return.

## 2020-06-05 NOTE — ED Notes (Signed)
Pt awake at this time. Pt given urinal per request at this time.

## 2020-06-05 NOTE — ED Notes (Signed)
Admitting Dr Blaine Hamper at bedside, Dr Blaine Hamper assessed pt and was questioning to this nurse why pt is being admitted, this RN was responding to Dr Blaine Hamper and discussing pt's sx and results. Pt asked for another "slushi", this RN stated would get one when available (pt had already had multiple). Pt asked for charge RN, charge to bedside, pt stating he does not like this RN and being verbally aggressive to this RN. Pt provided ginger ale as requested

## 2020-06-05 NOTE — ED Notes (Signed)
Patient transported to MRI 

## 2020-06-05 NOTE — ED Notes (Signed)
This RN to bedside. Pt visualized resting comfortably with eyes closed at this time. Pt RR even and unlabored at this time.   This RN visualized part of pt dinner tray eaten at this time.   Will continue to monitor at this time

## 2020-06-05 NOTE — ED Notes (Signed)
Pt given unit phone, saltine crackers, ginger ale, and icee per request at this time.

## 2020-06-05 NOTE — ED Notes (Signed)
Amy with code stroke team on neuro consult screen, pt in ct.

## 2020-06-05 NOTE — Progress Notes (Signed)
   06/05/20 0400  Clinical Encounter Type  Visited With Other (Comment) (See Note)   I responded to a Code Stroke. The pt had been taken back to CT. I let the nurses know if they needed me to page me.    [place], [place]

## 2020-06-05 NOTE — ED Provider Notes (Addendum)
[facility] Emergency Department Provider Note  ____________________________________________   Event Date/Time   First MD Initiated Contact with Patient 06/05/20 [phone]     (approximate)  I have reviewed the triage vital signs and the nursing notes.   HISTORY  Chief Complaint Code Stroke   HPI Samuel Molina is a 60 y.o. male the past medical history of anxiety, depression, GERD, substance abuse and chronic left hemibody weakness and facial droop of unclear etiology as well as possible left-sided Bell's palsy presents via EMS after he states he developed headache and worsening left-sided weakness today while driving.  He states this occurred at approximately 2:30 in the morning.  He denies any other acute weakness, vision changes, chest pain, cough, shortness of breath abdominal pain vomiting or diarrhea.  No recent injuries.  No other acute concerns at this time.        Past Medical History:  Diagnosis Date  . Anxiety   . Depression   . GERD (gastroesophageal reflux disease)   . Obesity   . Stroke ([place])   . Substance abuse [facility])     Patient Active Problem List   Diagnosis Date Noted  . TIA (transient ischemic attack) 06/05/2020  . Bell's palsy 04/26/2020  . Malingering 03/25/2020  . Personality disorder ([place]) 02/13/2020  . Bursitis 02/13/2020  . Substance induced mood disorder ([place]) 02/12/2020  . Severe recurrent major depression without psychotic features ([place]) 01/21/2020  . Cocaine abuse ([place]) 01/21/2020  . Hip pain 01/21/2020  . Major depression 01/20/2020  . Gastroesophageal reflux disease with esophagitis   . Morbid obesity ([place]) 09/19/2017  . Arthritis of knee 11/03/2015    Past Surgical History:  Procedure Laterality Date  . ESOPHAGOGASTRODUODENOSCOPY (EGD) WITH PROPOFOL N/A 09/20/2017   Procedure: ESOPHAGOGASTRODUODENOSCOPY (EGD) WITH PROPOFOL;  Surgeon: Lin Landsman, MD;  Location: [place];  Service:  Gastroenterology;  Laterality: N/A;  . KNEE SURGERY Right     Prior to Admission medications   Medication Sig Start Date End Date Taking? Authorizing Provider  aspirin EC 81 MG tablet Take 81 mg by mouth daily. Swallow whole.    [provider]  atorvastatin (LIPITOR) 80 MG tablet Take 80 mg by mouth every evening.    [provider]  citalopram (CELEXA) 10 MG tablet Take 1 tablet (10 mg total) by mouth daily. Patient taking differently: Take 5 mg by mouth daily. 03/30/20   Rankin, Shuvon B, NP  gabapentin (NEURONTIN) 400 MG capsule Take 400 mg by mouth 3 (three) times daily.    [provider]  lisinopril (ZESTRIL) 5 MG tablet Take 5 mg by mouth daily.    [provider]  pantoprazole (PROTONIX) 40 MG tablet Take 1 tablet (40 mg total) by mouth daily. 05/26/20 05/26/21  Fransico Meadow, PA-C  QUEtiapine (SEROQUEL) 25 MG tablet Take 1 tablet (25 mg total) by mouth 2 (two) times daily. Patient not taking: No sig reported 03/30/20   Rankin, Shuvon B, NP  QUEtiapine (SEROQUEL) 300 MG tablet Take 1 tablet (300 mg total) by mouth at bedtime. Patient not taking: No sig reported 03/30/20   Derrill Center, NP  tiZANidine (ZANAFLEX) 2 MG tablet Take 2 mg by mouth every 8 (eight) hours as needed for muscle spasms.    [provider]  omeprazole (PRILOSEC OTC) 20 MG tablet Take 2 tablets (40 mg total) by mouth 2 (two) times daily before a meal. 09/19/17 01/29/19  Lin Landsman, MD    Allergies Acetaminophen  No family history on file.  Social History Social History   Tobacco Use  . Smoking status: Current Every Day Smoker    Packs/day: 0.50    Types: Cigarettes  . Smokeless tobacco: Never Used  Vaping Use  . Vaping Use: Never used  Substance Use Topics  . Alcohol use: Not Currently  . Drug use: Yes    Types: Cocaine    Review of Systems  Review of Systems  Constitutional: Negative for chills and fever.  HENT: Negative for sore throat.    Eyes: Negative for pain.  Respiratory: Negative for cough and stridor.   Cardiovascular: Negative for chest pain.  Gastrointestinal: Negative for vomiting.  Genitourinary: Negative for dysuria.  Musculoskeletal: Negative for myalgias.  Skin: Negative for rash.  Neurological: Positive for sensory change ( decreased in L arm and L leg), focal weakness ( L arm, L leg), weakness and headaches. Negative for seizures and loss of consciousness.  Psychiatric/Behavioral: Negative for memory loss.  All other systems reviewed and are negative.     ____________________________________________   PHYSICAL EXAM:  VITAL SIGNS: ED Triage Vitals  Enc Vitals Group     BP      Pulse      Resp      Temp      Temp src      SpO2      Weight      Height      Head Circumference      Peak Flow      Pain Score      Pain Loc      Pain Edu?      Excl. in [place]?    Vitals:   06/05/20 0449 06/05/20 0500  BP:  140/82  Pulse:  61  Resp:  20  Temp: 98.1 F (36.7 C)   SpO2:  97%   Physical Exam Vitals and nursing note reviewed.  Constitutional:      Appearance: He is well-developed and well-nourished.  HENT:     Head: Normocephalic and atraumatic.  Eyes:     Conjunctiva/sclera: Conjunctivae normal.  Cardiovascular:     Rate and Rhythm: Normal rate and regular rhythm.     Heart sounds: No murmur heard.   Pulmonary:     Effort: Pulmonary effort is normal. No respiratory distress.     Breath sounds: Normal breath sounds.  Abdominal:     Palpations: Abdomen is soft.     Tenderness: There is no abdominal tenderness.  Musculoskeletal:        General: No edema.     Cervical back: Neck supple.  Skin:    General: Skin is warm and dry.  Neurological:     Mental Status: He is alert.     Cranial Nerves: Facial asymmetry present.     Motor: Weakness present.  Psychiatric:        Mood and Affect: Mood and affect normal.     No slurred speech.  Patient does have left-sided facial droop.   Any is unable to lift his left upper extremity or left lower extremity against any resistance and states he has decrease in station to light touch on the left.  He has full strength of the right upper and right lower extremities.  Cranial nerves II through XII with exception of cranial nerve VII on the left otherwise grossly intact. ____________________________________________   LABS (all labs ordered are listed, but only abnormal results are displayed)  Labs Reviewed  CBC - Abnormal; Notable  for the following components:      Result Value   MCH 25.7 (*)    All other components within normal limits  COMPREHENSIVE METABOLIC PANEL - Abnormal; Notable for the following components:   Total Protein 8.4 (*)    All other components within normal limits  ETHANOL  PROTIME-INR  APTT  DIFFERENTIAL  URINE DRUG SCREEN, QUALITATIVE ([facility] ONLY)  URINALYSIS, ROUTINE W REFLEX MICROSCOPIC  I-STAT CREATININE, ED  CBG MONITORING, ED  TROPONIN I (HIGH SENSITIVITY)  TROPONIN I (HIGH SENSITIVITY)   ____________________________________________  EKG  Sinus rhythm with ventricular to 62, normal axis, unremarkable levels, nonspecific T wave change in inferior leads and lateral leads. ____________________________________________  RADIOLOGY  ED MD interpretation: CT head on arrival shows no evidence of acute intracranial hemorrhage or other clear acute cranial process.  MRI brain shows no evidence of CVA.  No other clear acute intracranial normality.  Small congenital lipoma.  CT head and neck showed no evidence of vessel occlusion or other clear acute process.   Official radiology report(s): CT Angio Head W or Wo Contrast  Result Date: 06/05/2020 CLINICAL DATA:  60 year old male code stroke presentation. Left side weakness. EXAM: CT ANGIOGRAPHY HEAD AND NECK CT PERFUSION BRAIN TECHNIQUE: Multidetector CT imaging of the head and neck was performed using the standard protocol during bolus administration of  intravenous contrast. Multiplanar CT image reconstructions and MIPs were obtained to evaluate the vascular anatomy. Carotid stenosis measurements (when applicable) are obtained utilizing NASCET criteria, using the distal internal carotid diameter as the denominator. Multiphase CT imaging of the brain was performed following IV bolus contrast injection. Subsequent parametric perfusion maps were calculated using RAPID software. CONTRAST:  16mL OMNIPAQUE IOHEXOL 350 MG/ML SOLN COMPARISON:  Head CT 0413 hours, brain MRI 0528 hours today. FINDINGS: CT Brain Perfusion Findings: ASPECTS: 10 CBF (<30%) Volume: None Perfusion (Tmax>6.0s) volume: None Mismatch Volume: Not applicable Infarction Location:Not applicable CTA NECK Skeleton: Intermittent carious dentition. Lower cervical spine disc and endplate degeneration. No acute osseous abnormality identified. Upper chest: Negative. Other neck: Within normal limits. Aortic arch: 3 vessel arch configuration.  No arch atherosclerosis. Right carotid system: Negative. Left carotid system: Negative. Vertebral arteries: Normal proximal right subclavian artery and right vertebral artery origin. Right vertebral is patent and within normal limits to the skull base. Normal proximal left subclavian artery. Normal left vertebral artery origin. Left vertebral is codominant, patent and within normal limits to the skull base. CTA HEAD Posterior circulation: Distal vertebral arteries are patent to the basilar without plaque or stenosis. Normal PICA origins. Patent basilar artery with mild tortuosity. Patent SCA and PCA origins. Posterior communicating arteries are diminutive or absent. Bilateral PCA branches are within normal limits, tortuosity greater on the left. Anterior circulation: Both ICA siphons are patent without plaque or stenosis. Patent carotid termini. Normal MCA and ACA origins. Mildly dominant left A1. Normal anterior communicating artery. Bilateral ACA branches are within  normal limits, the right is mildly dominant. Left MCA M1 segment and bifurcation are patent without stenosis. Left MCA branches are within normal limits. Right MCA M1 and bifurcation are patent without stenosis. Right MCA branches are within normal limits. Venous sinuses: Patent. Anatomic variants: None significant. Review of the MIP images confirms the above findings IMPRESSION: 1. CTA Head and Neck and CT Perfusion are negative. No evidence of ischemia.  No arterial abnormality identified. 2. Intermittent dental caries. Lower cervical spine disc and endplate degeneration. Electronically Signed   By: Genevie Ann M.D.   On:  06/05/2020 06:11   CT Angio Neck W and/or Wo Contrast  Result Date: 06/05/2020 CLINICAL DATA:  60 year old male code stroke presentation. Left side weakness. EXAM: CT ANGIOGRAPHY HEAD AND NECK CT PERFUSION BRAIN TECHNIQUE: Multidetector CT imaging of the head and neck was performed using the standard protocol during bolus administration of intravenous contrast. Multiplanar CT image reconstructions and MIPs were obtained to evaluate the vascular anatomy. Carotid stenosis measurements (when applicable) are obtained utilizing NASCET criteria, using the distal internal carotid diameter as the denominator. Multiphase CT imaging of the brain was performed following IV bolus contrast injection. Subsequent parametric perfusion maps were calculated using RAPID software. CONTRAST:  133mL OMNIPAQUE IOHEXOL 350 MG/ML SOLN COMPARISON:  Head CT 0413 hours, brain MRI 0528 hours today. FINDINGS: CT Brain Perfusion Findings: ASPECTS: 10 CBF (<30%) Volume: None Perfusion (Tmax>6.0s) volume: None Mismatch Volume: Not applicable Infarction Location:Not applicable CTA NECK Skeleton: Intermittent carious dentition. Lower cervical spine disc and endplate degeneration. No acute osseous abnormality identified. Upper chest: Negative. Other neck: Within normal limits. Aortic arch: 3 vessel arch configuration.  No arch  atherosclerosis. Right carotid system: Negative. Left carotid system: Negative. Vertebral arteries: Normal proximal right subclavian artery and right vertebral artery origin. Right vertebral is patent and within normal limits to the skull base. Normal proximal left subclavian artery. Normal left vertebral artery origin. Left vertebral is codominant, patent and within normal limits to the skull base. CTA HEAD Posterior circulation: Distal vertebral arteries are patent to the basilar without plaque or stenosis. Normal PICA origins. Patent basilar artery with mild tortuosity. Patent SCA and PCA origins. Posterior communicating arteries are diminutive or absent. Bilateral PCA branches are within normal limits, tortuosity greater on the left. Anterior circulation: Both ICA siphons are patent without plaque or stenosis. Patent carotid termini. Normal MCA and ACA origins. Mildly dominant left A1. Normal anterior communicating artery. Bilateral ACA branches are within normal limits, the right is mildly dominant. Left MCA M1 segment and bifurcation are patent without stenosis. Left MCA branches are within normal limits. Right MCA M1 and bifurcation are patent without stenosis. Right MCA branches are within normal limits. Venous sinuses: Patent. Anatomic variants: None significant. Review of the MIP images confirms the above findings IMPRESSION: 1. CTA Head and Neck and CT Perfusion are negative. No evidence of ischemia.  No arterial abnormality identified. 2. Intermittent dental caries. Lower cervical spine disc and endplate degeneration. Electronically Signed   By: Genevie Ann M.D.   On: 06/05/2020 06:11   MR BRAIN WO CONTRAST  Result Date: 06/05/2020 CLINICAL DATA:  60 year old male code stroke presentation. Sudden onset left side weakness. EXAM: MRI HEAD WITHOUT CONTRAST TECHNIQUE: Multiplanar, multiecho pulse sequences of the brain and surrounding structures were obtained without intravenous contrast. COMPARISON:  Brain  MRI 04/01/2020.  Head CT 0413 hours. FINDINGS: Brain: No restricted diffusion to suggest acute infarction. No midline shift, mass effect, evidence of mass lesion, ventriculomegaly, extra-axial collection or acute intracranial hemorrhage. Cervicomedullary junction and pituitary are within normal limits. Cerebral volume remains normal. [place] and white matter signal is stable and normal. No encephalomalacia or chronic cerebral blood products identified. There is a small lipoma of the left quadrigeminal plate cistern (series 16, image 58 and series 13, image 21, with confirmed macroscopic fat on earlier CT. Vascular: Major intracranial vascular flow voids are stable. Skull and upper cervical spine: Stable, negative. Sinuses/Orbits: Stable, negative; minor mucosal thickening. Other: Mastoids remain well aerated. Grossly normal visible internal auditory structures. Small benign right nasopharyngeal retention cysts. Visible  scalp and face appear negative. IMPRESSION: Stable and normal noncontrast MRI appearance of the brain. No acute intracranial abnormality. 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 Electronically Signed   By: Genevie Ann M.D.   On: 06/05/2020 05:55   CT CEREBRAL PERFUSION W CONTRAST  Result Date: 06/05/2020 CLINICAL DATA:  59 year old male code stroke presentation. Left side weakness. EXAM: CT ANGIOGRAPHY HEAD AND NECK CT PERFUSION BRAIN TECHNIQUE: Multidetector CT imaging of the head and neck was performed using the standard protocol during bolus administration of intravenous contrast. Multiplanar CT image reconstructions and MIPs were obtained to evaluate the vascular anatomy. Carotid stenosis measurements (when applicable) are obtained utilizing NASCET criteria, using the distal internal carotid diameter as the denominator. Multiphase CT imaging of the brain was performed following IV bolus contrast injection. Subsequent parametric perfusion maps were calculated  using RAPID software. CONTRAST:  1100mL OMNIPAQUE IOHEXOL 350 MG/ML SOLN COMPARISON:  Head CT 0413 hours, brain MRI 0528 hours today. FINDINGS: CT Brain Perfusion Findings: ASPECTS: 10 CBF (<30%) Volume: None Perfusion (Tmax>6.0s) volume: None Mismatch Volume: Not applicable Infarction Location:Not applicable CTA NECK Skeleton: Intermittent carious dentition. Lower cervical spine disc and endplate degeneration. No acute osseous abnormality identified. Upper chest: Negative. Other neck: Within normal limits. Aortic arch: 3 vessel arch configuration.  No arch atherosclerosis. Right carotid system: Negative. Left carotid system: Negative. Vertebral arteries: Normal proximal right subclavian artery and right vertebral artery origin. Right vertebral is patent and within normal limits to the skull base. Normal proximal left subclavian artery. Normal left vertebral artery origin. Left vertebral is codominant, patent and within normal limits to the skull base. CTA HEAD Posterior circulation: Distal vertebral arteries are patent to the basilar without plaque or stenosis. Normal PICA origins. Patent basilar artery with mild tortuosity. Patent SCA and PCA origins. Posterior communicating arteries are diminutive or absent. Bilateral PCA branches are within normal limits, tortuosity greater on the left. Anterior circulation: Both ICA siphons are patent without plaque or stenosis. Patent carotid termini. Normal MCA and ACA origins. Mildly dominant left A1. Normal anterior communicating artery. Bilateral ACA branches are within normal limits, the right is mildly dominant. Left MCA M1 segment and bifurcation are patent without stenosis. Left MCA branches are within normal limits. Right MCA M1 and bifurcation are patent without stenosis. Right MCA branches are within normal limits. Venous sinuses: Patent. Anatomic variants: None significant. Review of the MIP images confirms the above findings IMPRESSION: 1. CTA Head and Neck and CT  Perfusion are negative. No evidence of ischemia.  No arterial abnormality identified. 2. Intermittent dental caries. Lower cervical spine disc and endplate degeneration. Electronically Signed   By: Genevie Ann M.D.   On: 06/05/2020 06:11   CT HEAD CODE STROKE WO CONTRAST  Result Date: 06/05/2020 CLINICAL DATA:  Code stroke. 60 year old male with sudden onset left side weakness, inability to walk. EXAM: CT HEAD WITHOUT CONTRAST TECHNIQUE: Contiguous axial images were obtained from the base of the skull through the vertex without intravenous contrast. COMPARISON:  Brain MRI 06/03/2019.  Head CT 06/03/2019. FINDINGS: Brain: Cerebral volume remains normal. No midline shift, ventriculomegaly, mass effect, evidence of mass lesion, intracranial hemorrhage or evidence of cortically based acute infarction. Gray-white matter differentiation is within normal limits throughout the brain. Vascular: No suspicious intracranial vascular hyperdensity. Skull: No acute osseous abnormality identified. Sinuses/Orbits: Regressed ethmoid sinus mucosal thickening since December. Other Visualized paranasal sinuses and mastoids are stable and well pneumatized. Other: Stable orbit and scalp soft tissues.  ASPECTS [facility] Stroke Program Early CT Score) Total score (0-10 with 10 being normal): 10 IMPRESSION: Stable and normal noncontrast CT appearance of the brain. ASPECTS 10. Electronically Signed   By: Genevie Ann M.D.   On: 06/05/2020 04:26    ____________________________________________   PROCEDURES  Procedure(s) performed (including Critical Care):  Procedures   ____________________________________________   INITIAL IMPRESSION / ASSESSMENT AND PLAN / ED COURSE      Patient presents with above-stated history exam for assessment of headache associated with acute on chronic left-sided weakness and decreased sensation.  He is afebrile hemodynamically stable on arrival.  Given history of acute neuro deficit within 6 hours  patient was code stroke prior to arrival.  After ABCs were cleared he was taken to CT where he underwent a Noncon that showed no evidence of intracranial hemorrhage.  Differential includes but is not limited to ischemic stroke versus metabolic derangement versus malingering.  Vessel imaging and MRI negative although I discussed with neurology who does recommend admission for further work-up.  Serum ethanol undetectable.  INR 1.1.  CBC unremarkable.  CMP shows no significant ocular metabolic derangements.  No history of recent trauma.  Very low suspicion for acute infectious process.  Low suspicion for toxic ingestion and no evidence of acute metabolic derangement on initial labs.  Patient will be admitted to medicine service for further evaluation management.  Patient is under the impression he suffered a stroke in December of last year and had a brain bleed after that.  On extensive review of records it seems patient did get TPA in December of last year but at that time also had completely normal head imaging including MRI brain.  He did get another MRI brain several weeks later that showed possible small subacute to chronic hemorrhage which I suspect may be related to TPA to 3 weeks before.  Given normal MRI and CT imaging today I strongly suspect patient may have a conversion disorder vs malingering although given neurology recommendations for observation will admit to medicine.  ____________________________________________   FINAL CLINICAL IMPRESSION(S) / ED DIAGNOSES  Final diagnoses:  Nonintractable headache, unspecified chronicity pattern, unspecified headache type  Weakness    Medications  sodium chloride flush (NS) 0.9 % injection 3 mL (3 mLs Intravenous Given 06/05/20 0442)  iohexol (OMNIPAQUE) 350 MG/ML injection 100 mL (100 mLs Intravenous Contrast Given 06/05/20 0549)     ED Discharge Orders    None       Note:  This document was prepared using Dragon voice recognition  software and may include unintentional dictation errors.   Lucrezia Starch, MD 06/05/20 [id number]    Lucrezia Starch, MD 06/05/20 [phone]

## 2020-06-05 NOTE — ED Notes (Signed)
Pt ambulatory to restroom

## 2020-06-05 NOTE — ED Notes (Signed)
Pt given meal tray at this time 

## 2020-06-05 NOTE — Discharge Instructions (Signed)
You were cared for by a hospitalist during your hospital stay. If you have any questions about your discharge medications or the care you received while you were in the hospital after you are discharged, you can call the unit and ask to speak with the hospitalist on call if the hospitalist that took care of you is not available. Once you are discharged, your primary care physician will handle any further medical issues. Please note that NO REFILLS for any discharge medications will be authorized once you are discharged, as it is imperative that you return to your primary care physician (or establish a relationship with a primary care physician if you do not have one) for your aftercare needs so that they can reassess your need for medications and monitor your lab values.  Follow up with your neurologist as soon as possible. Take all medications as prescribed. If symptoms change or worsen please return to the ED for evaluation

## 2020-06-05 NOTE — ED Notes (Signed)
Pt awake and ambulatory to toilet at this time.

## 2020-06-06 DIAGNOSIS — U071 COVID-19: Secondary | ICD-10-CM

## 2020-06-06 LAB — LIPID PANEL
Cholesterol: 84 mg/dL (ref 0–200)
HDL: 24 mg/dL — ABNORMAL LOW (ref >40)
LDL Cholesterol: 48 mg/dL (ref 0–99)
Total CHOL/HDL Ratio: 3.5 RATIO
Triglycerides: 58 mg/dL (ref <150)
VLDL: 12 mg/dL (ref 0–40)

## 2020-06-06 LAB — BASIC METABOLIC PANEL
Anion gap: 8 (ref 5–15)
BUN: 13 mg/dL (ref 6–20)
CO2: 23 mmol/L (ref 22–32)
Calcium: 8.5 mg/dL — ABNORMAL LOW (ref 8.9–10.3)
Chloride: 108 mmol/L (ref 98–111)
Creatinine, Ser: 0.67 mg/dL (ref 0.61–1.24)
GFR, Estimated: >60 mL/min (ref >60)
Glucose, Bld: 131 mg/dL — ABNORMAL HIGH (ref 70–99)
Potassium: 3.5 mmol/L (ref 3.5–5.1)
Sodium: 139 mmol/L (ref 135–145)

## 2020-06-06 LAB — CBC
HCT: 39.9 % (ref 39.0–52.0)
Hemoglobin: 12.4 g/dL — ABNORMAL LOW (ref 13.0–17.0)
MCH: 25.5 pg — ABNORMAL LOW (ref 26.0–34.0)
MCHC: 31.1 g/dL (ref 30.0–36.0)
MCV: 81.9 fL (ref 80.0–100.0)
Platelets: 289 10*3/uL (ref 150–400)
RBC: 4.87 MIL/uL (ref 4.22–5.81)
RDW: 15.5 % (ref 11.5–15.5)
WBC: 6.5 10*3/uL (ref 4.0–10.5)
nRBC: 0 % (ref 0.0–0.2)

## 2020-06-06 LAB — HEMOGLOBIN A1C
Hgb A1c MFr Bld: 5.6 % (ref 4.8–5.6)
Mean Plasma Glucose: 114.02 mg/dL

## 2020-06-06 LAB — C-REACTIVE PROTEIN: CRP: 0.6 mg/dL (ref <1.0)

## 2020-06-06 LAB — HEPATITIS B SURFACE ANTIGEN: Hepatitis B Surface Ag: NONREACTIVE

## 2020-06-06 MED ORDER — BACLOFEN 10 MG PO TABS
5.0000 mg | ORAL_TABLET | Freq: Three times a day (TID) | ORAL | Status: DC | PRN
  Administered 2020-06-06 – 2020-06-10 (×4): 5 mg via ORAL
  Filled 2020-06-06 (×5): qty 0.5

## 2020-06-06 NOTE — Evaluation (Signed)
Physical Therapy Evaluation Patient Details Name: Samuel Molina MRN: [id number] DOB: 04-09-1961 Today's Date: 06/06/2020   History of Present Illness  Pt is a 60 y/o male who was admitted on 06/05/20 after presenting to ED via EMS with c/c of worsening L sided weakness. Upon admission pt found to have (+) COVID test. Imaging was negative for acute abnormalities.  PMH: anxiety, depression, GERD, substance abuse & chronic L hemibody weakness & facial droop of unclear etiology, possible L sided Bell's palsy, obesity, stroke  Clinical Impression  Pt seen for PT evaluation with pt able to ambulate 3 laps in room mod I without AD with gait deviations noted below. Pt reports he only feels LLE is a little weaker now compared to PLOF. No overt LOB during gait/standing activities during session. At this time pt does not require acute PT services but feel he could benefit from OPPT f/u to focus on more normalized gait pattern and high level of mobility. Will sign off at this time. Encouraged pt to ambulate in room while in hospital.     Follow Up Recommendations Outpatient PT    Equipment Recommendations  None recommended by PT    Recommendations for Other Services       Precautions / Restrictions Precautions Precautions: Fall Restrictions Weight Bearing Restrictions: No      Mobility  Bed Mobility Overal bed mobility: Modified Independent Bed Mobility: Supine to Sit;Sit to Supine     Supine to sit: Modified independent (Device/Increase time);HOB elevated Sit to supine: Modified independent (Device/Increase time);HOB elevated        Transfers Overall transfer level: Modified independent Equipment used: None                Ambulation/Gait Ambulation/Gait assistance: Modified independent (Device/Increase time) Gait Distance (Feet): 70 Feet (3 laps to door & back in room)   Gait Pattern/deviations: Decreased step length - left;Decreased stride length;Decreased dorsiflexion -  left;Decreased weight shift to left Gait velocity: decreased   General Gait Details: decreased heel strike LLE; pt reports his LLE feels weaker compared to baseline & limits weight shifting to LLE during gait,  Stairs            Wheelchair Mobility    Modified Rankin (Stroke Patients Only)       Balance Overall balance assessment: Modified Independent                                           Pertinent Vitals/Pain Pain Assessment: Faces Pain Score:  ("12" out of 10) Faces Pain Scale: Hurts a little bit Pain Location: L side of body (UE & LE) Pain Descriptors / Indicators: Throbbing Pain Intervention(s): Monitored during session (pt reports pain is chronic since 1st stroke)    Home Living Family/patient expects to be discharged to:: Private residence Living Arrangements: Spouse/significant other Available Help at Discharge: Family;Available PRN/intermittently (lives with wife who is blind but cares for herself, can assist pt PRN (per pt)) Type of Home: House Home Access: Stairs to enter Entrance Stairs-Rails: Right Entrance Stairs-Number of Steps: 6 Home Layout: One level Home Equipment: Cane - single point      Prior Function Level of Independence: Independent         Comments: Pt reports that he is independent with ADLs, IADLs, and functional mobility.     Hand Dominance        Extremity/Trunk Assessment  Upper Extremity Assessment Upper Extremity Assessment: Overall WFL for tasks assessed    Lower Extremity Assessment Lower Extremity Assessment:  (Pt with absent sensation & proprioception in digits 3-5 of L foot. Heel to shin impaired BLE but worse LLE. Decreased hip flexion in sitting.)    Cervical / Trunk Assessment Cervical / Trunk Assessment: Normal  Communication   Communication: No difficulties  Cognition Arousal/Alertness: Awake/alert Behavior During Therapy: WFL for tasks assessed/performed Overall Cognitive Status:  Within Functional Limits for tasks assessed                                 General Comments: A&Ox4. Pleasant and agreeable throughout      General Comments General comments (skin integrity, edema, etc.): no LOB noted during gait without AD, pt reports he's been ambulating within room/bathroom & pushing telemetry box without assistance, SpO2 99-100% on room air    Exercises Other Exercises Other Exercises: Provided education on energy conservation strategies and routine modification, with pt verbalizing understanding   Assessment/Plan    PT Assessment Patent does not need any further PT services;All further PT needs can be met in the next venue of care  PT Problem List         PT Treatment Interventions      PT Goals (Current goals can be found in the Care Plan section)  Acute Rehab PT Goals Patient Stated Goal: to get stronger PT Goal Formulation: With patient Time For Goal Achievement: 06/20/20 Potential to Achieve Goals: Good    Frequency     Barriers to discharge        Co-evaluation               AM-PAC PT "6 Clicks" Mobility  Outcome Measure Help needed turning from your back to your side while in a flat bed without using bedrails?: None Help needed moving from lying on your back to sitting on the side of a flat bed without using bedrails?: None Help needed moving to and from a bed to a chair (including a wheelchair)?: None Help needed standing up from a chair using your arms (e.g., wheelchair or bedside chair)?: None Help needed to walk in hospital room?: None Help needed climbing 3-5 steps with a railing? : None 6 Click Score: 24    End of Session   Activity Tolerance: Patient tolerated treatment well Patient left: in bed;with call bell/phone within reach        Time: 1210-1222 PT Time Calculation (min) (ACUTE ONLY): 12 min   Charges:   PT Evaluation $PT Eval Low Complexity: 1 Low          Lavone Nian, PT,  DPT 06/06/20, 2:00 PM   [name] 06/06/2020, 1:58 PM

## 2020-06-06 NOTE — Progress Notes (Signed)
Pt has active orders for Telemetry and continuous pulse ox in [facility]; pt refuses to wear, "I do not want to be hooked up to anything"; advised Dr Jimmye Norman via secure chat; can not discontinue the orders at this time; documented that the pt refuses to be hooked up to telemetry and continuous pulse ox; called [facility] to advise them of said note and they are documenting for their records

## 2020-06-06 NOTE — Progress Notes (Addendum)
Neurology Progress Note  Patient ID: Samuel Molina is a 59 y.o. right-handed man with a past medical history significant for hypertension, ongoing tobacco and cocaine abuse, obesity, substance induced mood disorder, personality disorder, possible malingering, and musculoskeletal pain.  He initially presented on 12/9 to [facility] and was diagnosed with left-sided Bell's palsy but then presented to [facility] on 12/13 and was treated with tPA for concern for acute stroke due to left arm and leg weakness.  MRI was not completed at that admission but since then he has had multiple representations for waxing and waning left-sided arm/leg symptoms; his facial weakness has remained stable.  Working diagnoses have included complex migraines, hypertensive emergency with recrudescence of prior stroke (no clear stroke on MRI), and there has been some element of functional neurologic disorder on my examination with regards to his limb weakness  Initially consulted for: Acute on chronic left sided weakness  Major interval events/Subjective: - Feels symptoms are stable  Exam: Vitals:   06/06/20 0541 06/06/20 0832  BP: (!) 111/56 139/83  Pulse: 63 (!) 49  Resp: 16 18  Temp: 98.1 F (36.7 C) 98.4 F (36.9 C)  SpO2: 97% 100%   Gen: In bed, comfortable, continues to intermittently hiccup Resp: non-labored breathing, no grossly audible wheezing Cardiac: Perfusing extremities well  Abd: soft, nt  Neuro: MS: Alert, oriented to person, situation, Super Bowl Sunday, CN: Stable complete left facial droop.  EOMI intact.  Tongue midline Motor: Improved effort on strength testing today, though continues to have some giveaway weakness on the left side and gives me 4/5 strength at best throughout.  Slightly worse at the left hip flexor (4 -)  Pertinent Data:  Labs: HIV negative Hepatitis C negative CK, 412 (ref range 49-397, essentially normal when adjusted for race) ESR 25 (ref range 0-20 mm/hr, this slight  elevation can be attributed to ongoing cocaine/tobacco use)  Pending: CRP ANCA antibodies (MPO/PR-3) ANA C3, C4 Cryoglobulin Serum SPEP, IFE Urine UPEP, IFE, light chains, total protein Lyme antibodies Hepatitis B core antibody, surface antibody, surface antigen  2/12 MRI brain w/o contrast with left dorsal midbrain SWI blooming artifact, T1 bright, stable from April 01, 2020 (personally reviewed), no acute infarct                          Radiology notes that this SWI artifact correlates with an 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 I did review the seventh nerve root inflammation with Dr. Kyle Golden on 2/12.  We discussed that time course for persistent inflammation in Bell's palsy is not clearly described in the literature although he has seen this type of finding persistent for months.  I have only typically imaged patients in the acute phase and therefore do not have personal experience with this enhancement persisting; typically Bell's palsy symptoms begin to improve in the timeframe of 2 to 3 months.  In summary, the significance of the persistence of this inflammation 2 months   out from symptom onset is unclear.  It is additionally interesting that he has had intractable hiccups.  Occasionally this is seen as a presentation for neuromyelitis optica, though the differential does remain broad.  It is highly atypical for a neuromyelitis  optica attack to present with a cranial neuropathy, though there is some weak case report data.  I will therefore also send NMO antibodies.  Given his skin lesions, I do wonder about the possibility of something such as polyarteritis nodosa. Overall I have a very low concern for Lyme disease given time of year, but if Lyme serum antibodies have not resulted prior to CSF this should be obtained in the CSF as well.  COVID-19 has been associated with Bell's palsy as well, but he has been negative until about 10 days ago.  Unfortunately, due to ongoing hiccups his MRI cervical spine was significantly motion degraded and cannot exclude subtle evidence of inflammation there.  Given recurrent presentations with the same symptoms to the hospital, additional symptoms atypical for classic Bell's palsy (left-sided arm and leg weakness/numbness with some functional component but cannot rule out organic phenomena based on examination alone), and family history significant for cancer and reported vasculitis, I have initiated a broad work-up and will plan on lumbar puncture for additional characterization.  Discussed with the patient that the most likely outcome of lumbar puncture is nonspecific inflammation, however will assess for malignancy with CSF flow and cytopathology as well as infectious causes as detailed below.    Recommendations:  #Left facial nerve inflammation, Bell's palsy versus other inflammatory disorder #Gamma gap -HIV negative -Hepatitis C negative -CK, 412 (ref range 49-397, essentially normal when adjusted for race) -ESR 25 (ref range 0-20 mm/hr, this slight elevation can be attributed to ongoing cocaine/tobacco use) Pending serum labs: CRP ANCA antibodies (MPO/PR-3) ANA C3, C4 Cryoglobulin Serum SPEP, IFE Urine UPEP, IFE, light chains, total protein Lyme antibodies (serum) Hepatitis B core antibody, surface antibody, surface antigen -Lumbar puncture 2/14: Opening pressure, CSF flow, CSF  cytopathology, cell counts and tubes 1 and 4, glucose, protein, HSV 1/2 PCR (send out [facility] [email or web address]), VZV IgG (send out [facility] [email or web address]), EBV (send out [email or web address]), Lyme disease if still pending or positive from serum, oligoclonal bands.  If possible, contact lab to flash freeze 3 mL for potential future testing such as next generation sequencing.  #Hiccups - Baclofen 10 mg TID (dicontinued PRN tizandine which patient has not been using to avoid oversedation - Continue gabapentin 400 mg TID, consider uptitration - Thorazine may be considered but avoiding given metabolic syndrome  #Skin lesions -Appreciate evaluation by primary team -Consider dermatology for further evaluation 2/14 if etiology unclear to primary team  #Other comorbidities  -Continue counseling on ceasing cocaine and tobacco use, weight loss, diet, exercise  -Appreciate primary team management of COVID-19    MD-PhD [facility] [phone]  [facility] coverage for [facility] is from 8 AM to 4 AM in-house and 4 PM to 8 PM by telephone/video. 8 PM to 8 AM emergent questions or overnight urgent questions should be addressed to Teleneurology On-call or  neurohospitalist; contact information can be found on AMION  

## 2020-06-06 NOTE — Progress Notes (Signed)
PROGRESS NOTE    Samuel Molina  ZOX:[id number] DOB: 12/13/1960 DOA: 06/05/2020 PCP: Patient, No Pcp Per    Assessment & Plan:   Principal Problem:   Left-sided weakness Active Problems:   Gastroesophageal reflux disease with esophagitis   Cocaine abuse ([place])   TIA (transient ischemic attack)   Stroke ([place])   Depression   HTN (hypertension)   HLD (hyperlipidemia)   Tobacco abuse   Chest pain   COVID-19 virus infection   Left-sided weakness:  etiology unclear, possible TIA vs cocaine abuse. CT-head, MRI of brain, CTA of head and neck, and CT of cerebral perfusion are all negative for acute abnormalities. Continue on aspirin, statin. PT/OT consulted. Pt refused heart healthy/carb modified diet and requested regular diet. Neuro following and recs apprec   Possible Bell's palsy: continue w/ supportive care. Neuro following and recs apprec   COVID-19 virus infection: positive test on 05/26/2020. Mild dry cough and not requiring supplemental oxygen. CXR was clear. No need to start treatment currently   Chest pain: likely secondary to cocaine use. Troponin neg x 3. Resolved   Cocaine abuse: illicit drug use cessation counseling   GERD: continue on PPI and pepcid   Hx of TIA & CVA: continue on aspirin, statin   Depression: severity unknown. Continue on home dose of celexa   HTN: hold home dose of lisinopril. IV hydralazine prn. Pt refused heart healthy/carb modified diet and requested regular diet  HLD: continue on statin   Tobacco abuse: nicotine patch to prevent w/drawal. Smoking cessation counseling     DVT prophylaxis: lovenox  Code Status: full  Family Communication:  Disposition Plan: depends on PT/OT recs  Level of care: Med-Surg   Status is: Observation  The patient remains OBS appropriate and will d/c before 2 midnights.  Dispo: The patient is from: Home              Anticipated d/c is to: Home              Anticipated d/c date is: 2 days               Patient currently is not medically stable to d/c.   Difficult to place patient Yes   Consultants:   neuro   Procedures:    Antimicrobials:    Subjective: Pt c/o left sided weakness still   Objective: Vitals:   06/05/20 1815 06/05/20 1900 06/05/20 2020 06/06/20 0529  BP: 115/70 137/71 (!) 153/85 123/65  Pulse: (!) 57 (!) 54 (!) 53 (!) 51  Resp: 16 16 18 18   Temp:   97.6 F (36.4 C) 97.8 F (36.6 C)  TempSrc:   Oral Oral  SpO2: 95% 96% 100% 97%  Weight:       No intake or output data in the 24 hours ending 06/06/20 0720 Filed Weights   06/05/20 0813  Weight: 99.8 kg    Examination:  General exam: Appears calm but uncomfortable. Obese Respiratory system: Clear to auscultation. Respiratory effort normal. Cardiovascular system: S1 & S2 +. No rubs, gallops or clicks.  Gastrointestinal system: Abdomen is obese, soft and nontender. Normal bowel sounds heard. Central nervous system: Alert and oriented. Moves all extremities. Left sided facial droop Psychiatry: Judgement and insight appear normal. Mood & affect appropriate.     Data Reviewed: I have personally reviewed following labs and imaging studies  CBC: Recent Labs  Lab 06/05/20 0426 06/06/20 0633  WBC 7.6 6.5  NEUTROABS 4.7  --   HGB 13.3 12.4*  HCT 42.0 39.9  MCV 81.1 81.9  PLT 317 242   Basic Metabolic Panel: Recent Labs  Lab 06/05/20 0426 06/06/20 0633  NA 142 139  K 3.6 3.5  CL 108 108  CO2 23 23  GLUCOSE 99 131*  BUN 19 13  CREATININE 0.73 0.67  CALCIUM 9.1 8.5*   GFR: Estimated Creatinine Clearance: 125.1 mL/min (by C-G formula based on SCr of 0.67 mg/dL). Liver Function Tests: Recent Labs  Lab 06/05/20 0426  AST 28  ALT 24  ALKPHOS 94  BILITOT 0.8  PROT 8.4*  ALBUMIN 4.3   No results for input(s): LIPASE, AMYLASE in the last 168 hours. No results for input(s): AMMONIA in the last 168 hours. Coagulation Profile: Recent Labs  Lab 06/05/20 0426  INR 1.1   Cardiac  Enzymes: Recent Labs  Lab 06/05/20 1144  CKTOTAL 412*   BNP (last 3 results) No results for input(s): PROBNP in the last 8760 hours. HbA1C: No results for input(s): HGBA1C in the last 72 hours. CBG: No results for input(s): GLUCAP in the last 168 hours. Lipid Profile: Recent Labs    06/06/20 0633  CHOL 84  HDL 24*  LDLCALC 48  TRIG 58  CHOLHDL 3.5   Thyroid Function Tests: No results for input(s): TSH, T4TOTAL, FREET4, T3FREE, THYROIDAB in the last 72 hours. Anemia Panel: No results for input(s): VITAMINB12, FOLATE, FERRITIN, TIBC, IRON, RETICCTPCT in the last 72 hours. Sepsis Labs: No results for input(s): PROCALCITON, LATICACIDVEN in the last 168 hours.  No results found for this or any previous visit (from the past 240 hour(s)).       Radiology Studies: CT Angio Head W or Wo Contrast  Result Date: 06/05/2020 CLINICAL DATA:  60 year old male code stroke presentation. Left side weakness. EXAM: CT ANGIOGRAPHY HEAD AND NECK CT PERFUSION BRAIN TECHNIQUE: Multidetector CT imaging of the head and neck was performed using the standard protocol during bolus administration of intravenous contrast. Multiplanar CT image reconstructions and MIPs were obtained to evaluate the vascular anatomy. Carotid stenosis measurements (when applicable) are obtained utilizing NASCET criteria, using the distal internal carotid diameter as the denominator. Multiphase CT imaging of the brain was performed following IV bolus contrast injection. Subsequent parametric perfusion maps were calculated using RAPID software. CONTRAST:  176mL OMNIPAQUE IOHEXOL 350 MG/ML SOLN COMPARISON:  Head CT 0413 hours, brain MRI 0528 hours today. FINDINGS: CT Brain Perfusion Findings: ASPECTS: 10 CBF (<30%) Volume: None Perfusion (Tmax>6.0s) volume: None Mismatch Volume: Not applicable Infarction Location:Not applicable CTA NECK Skeleton: Intermittent carious dentition. Lower cervical spine disc and endplate degeneration. No  acute osseous abnormality identified. Upper chest: Negative. Other neck: Within normal limits. Aortic arch: 3 vessel arch configuration.  No arch atherosclerosis. Right carotid system: Negative. Left carotid system: Negative. Vertebral arteries: Normal proximal right subclavian artery and right vertebral artery origin. Right vertebral is patent and within normal limits to the skull base. Normal proximal left subclavian artery. Normal left vertebral artery origin. Left vertebral is codominant, patent and within normal limits to the skull base. CTA HEAD Posterior circulation: Distal vertebral arteries are patent to the basilar without plaque or stenosis. Normal PICA origins. Patent basilar artery with mild tortuosity. Patent SCA and PCA origins. Posterior communicating arteries are diminutive or absent. Bilateral PCA branches are within normal limits, tortuosity greater on the left. Anterior circulation: Both ICA siphons are patent without plaque or stenosis. Patent carotid termini. Normal MCA and ACA origins. Mildly dominant left A1. Normal anterior communicating artery. Bilateral ACA branches  are within normal limits, the right is mildly dominant. Left MCA M1 segment and bifurcation are patent without stenosis. Left MCA branches are within normal limits. Right MCA M1 and bifurcation are patent without stenosis. Right MCA branches are within normal limits. Venous sinuses: Patent. Anatomic variants: None significant. Review of the MIP images confirms the above findings IMPRESSION: 1. CTA Head and Neck and CT Perfusion are negative. No evidence of ischemia.  No arterial abnormality identified. 2. Intermittent dental caries. Lower cervical spine disc and endplate degeneration. Electronically Signed   By: Genevie Ann M.D.   On: 06/05/2020 06:11   CT Angio Neck W and/or Wo Contrast  Result Date: 06/05/2020 CLINICAL DATA:  60 year old male code stroke presentation. Left side weakness. EXAM: CT ANGIOGRAPHY HEAD AND NECK CT  PERFUSION BRAIN TECHNIQUE: Multidetector CT imaging of the head and neck was performed using the standard protocol during bolus administration of intravenous contrast. Multiplanar CT image reconstructions and MIPs were obtained to evaluate the vascular anatomy. Carotid stenosis measurements (when applicable) are obtained utilizing NASCET criteria, using the distal internal carotid diameter as the denominator. Multiphase CT imaging of the brain was performed following IV bolus contrast injection. Subsequent parametric perfusion maps were calculated using RAPID software. CONTRAST:  157mL OMNIPAQUE IOHEXOL 350 MG/ML SOLN COMPARISON:  Head CT 0413 hours, brain MRI 0528 hours today. FINDINGS: CT Brain Perfusion Findings: ASPECTS: 10 CBF (<30%) Volume: None Perfusion (Tmax>6.0s) volume: None Mismatch Volume: Not applicable Infarction Location:Not applicable CTA NECK Skeleton: Intermittent carious dentition. Lower cervical spine disc and endplate degeneration. No acute osseous abnormality identified. Upper chest: Negative. Other neck: Within normal limits. Aortic arch: 3 vessel arch configuration.  No arch atherosclerosis. Right carotid system: Negative. Left carotid system: Negative. Vertebral arteries: Normal proximal right subclavian artery and right vertebral artery origin. Right vertebral is patent and within normal limits to the skull base. Normal proximal left subclavian artery. Normal left vertebral artery origin. Left vertebral is codominant, patent and within normal limits to the skull base. CTA HEAD Posterior circulation: Distal vertebral arteries are patent to the basilar without plaque or stenosis. Normal PICA origins. Patent basilar artery with mild tortuosity. Patent SCA and PCA origins. Posterior communicating arteries are diminutive or absent. Bilateral PCA branches are within normal limits, tortuosity greater on the left. Anterior circulation: Both ICA siphons are patent without plaque or stenosis. Patent  carotid termini. Normal MCA and ACA origins. Mildly dominant left A1. Normal anterior communicating artery. Bilateral ACA branches are within normal limits, the right is mildly dominant. Left MCA M1 segment and bifurcation are patent without stenosis. Left MCA branches are within normal limits. Right MCA M1 and bifurcation are patent without stenosis. Right MCA branches are within normal limits. Venous sinuses: Patent. Anatomic variants: None significant. Review of the MIP images confirms the above findings IMPRESSION: 1. CTA Head and Neck and CT Perfusion are negative. No evidence of ischemia.  No arterial abnormality identified. 2. Intermittent dental caries. Lower cervical spine disc and endplate degeneration. Electronically Signed   By: Genevie Ann M.D.   On: 06/05/2020 06:11   MR BRAIN WO CONTRAST  Result Date: 06/05/2020 CLINICAL DATA:  60 year old male code stroke presentation. Sudden onset left side weakness. EXAM: MRI HEAD WITHOUT CONTRAST TECHNIQUE: Multiplanar, multiecho pulse sequences of the brain and surrounding structures were obtained without intravenous contrast. COMPARISON:  Brain MRI 04/01/2020.  Head CT 0413 hours. FINDINGS: Brain: No restricted diffusion to suggest acute infarction. No midline shift, mass effect, evidence of mass lesion, ventriculomegaly,  extra-axial collection or acute intracranial hemorrhage. Cervicomedullary junction and pituitary are within normal limits. Cerebral volume remains normal. [place] and white matter signal is stable and normal. No encephalomalacia or chronic cerebral blood products identified. There is a small lipoma of the left quadrigeminal plate cistern (series 16, image 58 and series 13, image 21, with confirmed macroscopic fat on earlier CT. Vascular: Major intracranial vascular flow voids are stable. Skull and upper cervical spine: Stable, negative. Sinuses/Orbits: Stable, negative; minor mucosal thickening. Other: Mastoids remain well aerated. Grossly normal  visible internal auditory structures. Small benign right nasopharyngeal retention cysts. Visible scalp and face appear negative. IMPRESSION: Stable and normal noncontrast MRI appearance of the brain. No acute intracranial abnormality. 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 Electronically Signed   By: Genevie Ann M.D.   On: 06/05/2020 05:55   MR BRAIN W CONTRAST  Result Date: 06/05/2020 CLINICAL DATA:  Headache, chronic, no new features. Additional history provided: Left hemibody weakness and facial droop of unclear etiology, possible left-sided Bell's palsy. EXAM: MRI HEAD WITH CONTRAST TECHNIQUE: Multiplanar, multiecho pulse sequences of the brain and surrounding structures were obtained with intravenous contrast. CONTRAST:  10 mL Gadavist intravenous contrast. COMPARISON:  Noncontrast head CT and CT angiogram head/neck as well as CT perfusion 06/05/2020. FINDINGS: Postcontrast T1 weighted axial and coronal sequences were performed as a follow-up to the noncontrast brain MRI performed earlier today 06/05/2020. A precontrast T1 weighted sequence was also acquired. There is asymmetrically prominent enhancement along the course of the left facial nerve, most notably within the left ICA fundus and of the geniculate ganglion (series 2, image 46). No abnormal intracranial enhancement is identified elsewhere. IMPRESSION: Asymmetrically prominent enhancement along the course of the left facial nerve, most notably within the left IAC fundus and at the level of the geniculate ganglion. Findings likely reflect sequela of Bell's palsy. However, close clinical follow-up is recommended (with follow-up imaging as warranted) to exclude alternative etiologies such as perineural tumor spread. No other sites no other sites of abnormal intracranial enhancement are identified. Electronically Signed   By: Kellie Simmering DO   On: 06/05/2020 13:21   MR CERVICAL SPINE W WO CONTRAST  Result Date:  06/05/2020 CLINICAL DATA:  Chronic neck pain and chronic left hemibody weakness and facial. EXAM: MRI CERVICAL SPINE WITHOUT AND WITH CONTRAST TECHNIQUE: Multiplanar and multiecho pulse sequences of the cervical spine, to include the craniocervical junction and cervicothoracic junction, were obtained without and with intravenous contrast. CONTRAST:  90mL GADAVIST GADOBUTROL 1 MMOL/ML IV SOLN COMPARISON:  Radiographs 06/05/2020 FINDINGS: Very limited examination due to motion artifact. Alignment: Normal Vertebrae: Normal marrow signal. No bone lesions or fractures. No areas of abnormal contrast enhancement. Cord: Possible linear high T2 signal intensity from C2-3 to C4-5. This could be a slightly prominent central canal or artifact. Posterior Fossa, vertebral arteries, paraspinal tissues: 5 no significant findings. Disc levels: C2-3: No significant findings. C3-4: No significant findings. C4-5: Diffuse bulging degenerated annulus and osteophytic ridging with flattening of the ventral thecal sac and suspected mild foraminal narrowing bilaterally. C5-6: Bulging annulus and osteophytic ridging with mild impression on the thecal sac and narrowing the ventral CSF space. No obvious foraminal stenosis. C6-7: Small central disc protrusion with minimal impression on the ventral thecal sac. No spinal or foraminal stenosis. C7-T1: No significant findings. IMPRESSION: 1. Very limited examination due to motion artifact. 2. Possible linear high T2 signal intensity from C2-3 to C4-5 could be a slightly prominent central  canal or artifact. 3. Diffuse bulging annulus and osteophytic ridging at C4-5 with mild spinal and bilateral foraminal stenosis suspected. 4. Bulging annulus and osteophytic ridging at C5-6 with mild impression on the thecal sac but no significant spinal or foraminal stenosis. 5. Small central disc protrusion at C6-7 but no significant spinal or foraminal stenosis. Electronically Signed   By: Marijo Sanes M.D.    On: 06/05/2020 13:27   CT CEREBRAL PERFUSION W CONTRAST  Result Date: 06/05/2020 CLINICAL DATA:  60 year old male code stroke presentation. Left side weakness. EXAM: CT ANGIOGRAPHY HEAD AND NECK CT PERFUSION BRAIN TECHNIQUE: Multidetector CT imaging of the head and neck was performed using the standard protocol during bolus administration of intravenous contrast. Multiplanar CT image reconstructions and MIPs were obtained to evaluate the vascular anatomy. Carotid stenosis measurements (when applicable) are obtained utilizing NASCET criteria, using the distal internal carotid diameter as the denominator. Multiphase CT imaging of the brain was performed following IV bolus contrast injection. Subsequent parametric perfusion maps were calculated using RAPID software. CONTRAST:  154mL OMNIPAQUE IOHEXOL 350 MG/ML SOLN COMPARISON:  Head CT 0413 hours, brain MRI 0528 hours today. FINDINGS: CT Brain Perfusion Findings: ASPECTS: 10 CBF (<30%) Volume: None Perfusion (Tmax>6.0s) volume: None Mismatch Volume: Not applicable Infarction Location:Not applicable CTA NECK Skeleton: Intermittent carious dentition. Lower cervical spine disc and endplate degeneration. No acute osseous abnormality identified. Upper chest: Negative. Other neck: Within normal limits. Aortic arch: 3 vessel arch configuration.  No arch atherosclerosis. Right carotid system: Negative. Left carotid system: Negative. Vertebral arteries: Normal proximal right subclavian artery and right vertebral artery origin. Right vertebral is patent and within normal limits to the skull base. Normal proximal left subclavian artery. Normal left vertebral artery origin. Left vertebral is codominant, patent and within normal limits to the skull base. CTA HEAD Posterior circulation: Distal vertebral arteries are patent to the basilar without plaque or stenosis. Normal PICA origins. Patent basilar artery with mild tortuosity. Patent SCA and PCA origins. Posterior  communicating arteries are diminutive or absent. Bilateral PCA branches are within normal limits, tortuosity greater on the left. Anterior circulation: Both ICA siphons are patent without plaque or stenosis. Patent carotid termini. Normal MCA and ACA origins. Mildly dominant left A1. Normal anterior communicating artery. Bilateral ACA branches are within normal limits, the right is mildly dominant. Left MCA M1 segment and bifurcation are patent without stenosis. Left MCA branches are within normal limits. Right MCA M1 and bifurcation are patent without stenosis. Right MCA branches are within normal limits. Venous sinuses: Patent. Anatomic variants: None significant. Review of the MIP images confirms the above findings IMPRESSION: 1. CTA Head and Neck and CT Perfusion are negative. No evidence of ischemia.  No arterial abnormality identified. 2. Intermittent dental caries. Lower cervical spine disc and endplate degeneration. Electronically Signed   By: Genevie Ann M.D.   On: 06/05/2020 06:11   DG Chest Port 1 View  Result Date: 06/05/2020 CLINICAL DATA:  Chest pain EXAM: PORTABLE CHEST 1 VIEW COMPARISON:  May 05, 2020 FINDINGS: Lungs are clear. The heart size and pulmonary vascularity are normal. No adenopathy. No pneumothorax. No bone lesions. IMPRESSION: Lungs clear.  Cardiac silhouette normal. Electronically Signed   By: Lowella Grip III M.D.   On: 06/05/2020 09:03   CT HEAD CODE STROKE WO CONTRAST  Result Date: 06/05/2020 CLINICAL DATA:  Code stroke. 60 year old male with sudden onset left side weakness, inability to walk. EXAM: CT HEAD WITHOUT CONTRAST TECHNIQUE: Contiguous axial images were obtained from the  base of the skull through the vertex without intravenous contrast. COMPARISON:  Brain MRI 06/03/2019.  Head CT 06/03/2019. FINDINGS: Brain: Cerebral volume remains normal. No midline shift, ventriculomegaly, mass effect, evidence of mass lesion, intracranial hemorrhage or evidence of cortically  based acute infarction. Gray-white matter differentiation is within normal limits throughout the brain. Vascular: No suspicious intracranial vascular hyperdensity. Skull: No acute osseous abnormality identified. Sinuses/Orbits: Regressed ethmoid sinus mucosal thickening since December. Other Visualized paranasal sinuses and mastoids are stable and well pneumatized. Other: Stable orbit and scalp soft tissues. ASPECTS [facility] Stroke Program Early CT Score) Total score (0-10 with 10 being normal): 10 IMPRESSION: Stable and normal noncontrast CT appearance of the brain. ASPECTS 10. Electronically Signed   By: Genevie Ann M.D.   On: 06/05/2020 04:26        Scheduled Meds: .  stroke: mapping our early stages of recovery book   Does not apply Once  . aspirin EC  81 mg Oral Daily  . atorvastatin  80 mg Oral QPM  . citalopram  20 mg Oral Daily  . diclofenac Sodium  4 g Topical QID  . enoxaparin (LOVENOX) injection  40 mg Subcutaneous Q24H  . famotidine  20 mg Oral BID  . gabapentin  400 mg Oral TID  . lidocaine  1 patch Transdermal Q24H  . magnesium oxide  400 mg Oral Daily  . nicotine  21 mg Transdermal Daily  . pantoprazole  40 mg Oral Daily   Continuous Infusions: . sodium chloride 75 mL/hr at 06/05/20 1453     LOS: 0 days    Time spent: 35 mins    [name], MD [facility] Pager [phone]  If 7PM-7AM, please contact night-coverage 06/06/2020, 7:20 AM

## 2020-06-06 NOTE — Evaluation (Signed)
Occupational Therapy Evaluation Patient Details Name: Samuel Molina MRN: [id number] DOB: 02-09-1961 Today's Date: 06/06/2020    History of Present Illness 60 y.o. male with medical history significant of stroke with left-sided weakness, hypertension, hyperlipidemia, GERD, depression, Bell's palsy with left facial droop, cocaine abuse, tobacco abuse, who presents with worsening left-sided weakness.   Clinical Impression   Pt seen for OT evaluation this date. Prior to admission, pt was independent with ADLs, IADLs, and functional mobility. 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 Pt denies any falls within the past 6 months. Pt lives in an apartment with a roommate, who is able to provide assistance PRN. This session, pt was able to complete bed mobility, functional mobility, bathing, and dressing MOD-I, requiring increased time only for functional mobility. Pt educated in energy conservation strategies including activity pacing, home/routines modifications, and prioritizing of meaningful occupations, with pt verbalizing understanding. Pt demonstrates baseline independence to perform ADL and mobility tasks. No skilled OT needs identified at this time. Will sign off. Please re-consult if additional OT needs arise.      Follow Up Recommendations  No OT follow up    Equipment Recommendations  None recommended by OT       Precautions / Restrictions Precautions Precautions: Fall Restrictions Weight Bearing Restrictions: No      Mobility Bed Mobility Overal bed mobility: Independent Bed Mobility: Supine to Sit;Sit to Supine     Supine to sit: Independent Sit to supine: Independent        Transfers Overall transfer level: Modified independent Equipment used: None                  Balance Overall balance assessment: Modified Independent (Able to participate in standing bathing/dressing with no LOB noted)                                          ADL either performed or assessed with clinical judgement   ADL Overall ADL's : Independent                                       General ADL Comments: Pt able to complete standing dressing and bathing in bathroom independently. Able to walk to bathroom MOD-I, requiring increased time only     Vision Baseline Vision/History: Wears glasses Wears Glasses: Reading only              Pertinent Vitals/Pain Pain Assessment: Faces Faces Pain Scale: Hurts a little bit Pain Location: Neck Pain Descriptors / Indicators: Discomfort Pain Intervention(s): Limited activity within patient's tolerance;Monitored during session;Premedicated before session        Extremity/Trunk Assessment Upper Extremity Assessment Upper Extremity Assessment: Overall WFL for tasks assessed (4/5 LUE & 5/5 RUE, however WFL during functional activities)   Lower Extremity Assessment Lower Extremity Assessment: Defer to PT evaluation   Cervical / Trunk Assessment Cervical / Trunk Assessment: Normal   Communication Communication Communication: No difficulties   Cognition Arousal/Alertness: Awake/alert Behavior During Therapy: WFL for tasks assessed/performed Overall Cognitive Status: Within Functional Limits for tasks assessed                                 General Comments: A&Ox4. Pleasant and agreeable throughout  General Comments       Exercises Other Exercises Other Exercises: Provided education on energy conservation strategies and routine modification, with pt verbalizing understanding        [place] expects to be discharged to:: Private residence Living Arrangements: Non-relatives/Friends (Roommate) Available Help at Discharge: Other (Comment) (Roommate works but is able to provide some assistance if necessary) Type of Home: Apartment Home Access: Stairs to enter [employer] of Steps: 12 Entrance  Stairs-Rails: Right Home Layout: One level     Bathroom Shower/Tub: Teacher, early years/pre: Standard                Prior Functioning/Environment Level of Independence: Independent        Comments: Pt reports that he is independent with ADLs, IADLs, and functional mobility. 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        OT Problem List: Decreased strength;Decreased activity tolerance      OT Treatment/Interventions:      OT Goals(Current goals can be found in the care plan section) Acute Rehab OT Goals Patient Stated Goal: to get stronger OT Goal Formulation: With patient Time For Goal Achievement: 06/20/20 Potential to Achieve Goals: Good  OT Frequency:      AM-PAC OT "6 Clicks" Daily Activity     Outcome Measure Help from another person eating meals?: None Help from another person taking care of personal grooming?: None Help from another person toileting, which includes using toliet, bedpan, or urinal?: None Help from another person bathing (including washing, rinsing, drying)?: None Help from another person to put on and taking off regular upper body clothing?: None Help from another person to put on and taking off regular lower body clothing?: None 6 Click Score: 24   End of Session Nurse Communication: Mobility status  Activity Tolerance: Patient tolerated treatment well Patient left: in bed;with call bell/phone within reach;with bed alarm set  OT Visit Diagnosis: Muscle weakness (generalized) (M62.81)                Time: 0925-1000 OT Time Calculation (min): 35 min Charges:  OT General Charges $OT Visit: 1 Visit OT Evaluation $OT Eval Moderate Complexity: 1 Mod OT Treatments $Self Care/Home Management : 23-37 mins  Fredirick Maudlin, OTR/L [place]

## 2020-06-06 NOTE — Plan of Care (Signed)
  Problem: Activity: Goal: Risk for activity intolerance will decrease Outcome: Progressing   Problem: Nutrition: Goal: Adequate nutrition will be maintained Outcome: Progressing   Problem: Pain Managment: Goal: General experience of comfort will improve Outcome: Progressing   Problem: Safety: Goal: Ability to remain free from injury will improve Outcome: Progressing   Problem: Skin Integrity: Goal: Risk for impaired skin integrity will decrease Outcome: Progressing   Problem: Education: Goal: Knowledge of disease or condition will improve Outcome: Not Progressing  Stroke book and Bell's Palsy information given to pt; per MD Neurologist will determine if this is a TIA or Bell's Palsy

## 2020-06-07 LAB — BASIC METABOLIC PANEL
Anion gap: 10 (ref 5–15)
BUN: 12 mg/dL (ref 6–20)
CO2: 26 mmol/L (ref 22–32)
Calcium: 8.8 mg/dL — ABNORMAL LOW (ref 8.9–10.3)
Chloride: 105 mmol/L (ref 98–111)
Creatinine, Ser: 0.74 mg/dL (ref 0.61–1.24)
GFR, Estimated: >60 mL/min (ref >60)
Glucose, Bld: 82 mg/dL (ref 70–99)
Potassium: 3.9 mmol/L (ref 3.5–5.1)
Sodium: 141 mmol/L (ref 135–145)

## 2020-06-07 LAB — CBC
HCT: 42.1 % (ref 39.0–52.0)
Hemoglobin: 13 g/dL (ref 13.0–17.0)
MCH: 25.5 pg — ABNORMAL LOW (ref 26.0–34.0)
MCHC: 30.9 g/dL (ref 30.0–36.0)
MCV: 82.7 fL (ref 80.0–100.0)
Platelets: 300 10*3/uL (ref 150–400)
RBC: 5.09 MIL/uL (ref 4.22–5.81)
RDW: 14.9 % (ref 11.5–15.5)
WBC: 7.6 10*3/uL (ref 4.0–10.5)
nRBC: 0 % (ref 0.0–0.2)

## 2020-06-07 LAB — MPO/PR-3 (ANCA) ANTIBODIES
ANCA Proteinase 3: <3.5 U/mL (ref 0.0–3.5)
Myeloperoxidase Abs: <9 U/mL (ref 0.0–9.0)

## 2020-06-07 LAB — HEPATITIS B SURFACE ANTIBODY, QUANTITATIVE: Hep B S AB Quant (Post): 262.9 m[IU]/mL (ref >9.9)

## 2020-06-07 LAB — C3 COMPLEMENT: C3 Complement: 112 mg/dL (ref 82–167)

## 2020-06-07 LAB — HEPATITIS B CORE ANTIBODY, TOTAL: Hep B Core Total Ab: REACTIVE — AB

## 2020-06-07 LAB — C4 COMPLEMENT: Complement C4, Body Fluid: 30 mg/dL (ref 12–38)

## 2020-06-07 LAB — ANA W/REFLEX IF POSITIVE: Anti Nuclear Antibody (ANA): NEGATIVE

## 2020-06-07 MED ORDER — QUETIAPINE FUMARATE 25 MG PO TABS
100.0000 mg | ORAL_TABLET | Freq: Every day | ORAL | Status: DC
  Administered 2020-06-07 – 2020-06-10 (×4): 100 mg via ORAL
  Filled 2020-06-07 (×3): qty 4

## 2020-06-07 NOTE — Progress Notes (Signed)
PROGRESS NOTE    Samuel Molina  INO:[id number] DOB: 1961-03-19 DOA: 06/05/2020 PCP: Patient, No Pcp Per    Assessment & Plan:   Principal Problem:   Left-sided weakness Active Problems:   Gastroesophageal reflux disease with esophagitis   Cocaine abuse ([place])   TIA (transient ischemic attack)   Stroke ([place])   Depression   HTN (hypertension)   HLD (hyperlipidemia)   Tobacco abuse   Chest pain   COVID-19 virus infection   Left-sided weakness:  etiology unclear, possible TIA vs cocaine abuse. LP ordered for today as per neuro. CT-head, MRI of brain, CTA of head and neck, and CT of cerebral perfusion are all negative for acute abnormalities. Continue on aspirin, statin. PT recs outpatient therapy Pt refused heart healthy/carb modified diet and requested regular diet. Neuro following and recs apprec   Possible Bell's palsy: continue w/ supportive care. Neuro following and recs apprec   COVID-19 virus infection: positive test on 05/26/2020. CXR neg. Mild dry cough. No treatment indicated at this time   Chest pain: likely secondary to cocaine use. Troponin neg x 3. Resolved   Cocaine abuse: illicit drug use cessation counseling   GERD: continue on pantoprazole, pepcid   Hx of TIA & CVA: continue on aspirin, statin   Depression: severity unknown. Continue on home dose of celexa   HTN: hold home dose of lisinopril. IV hydralazine prn. Pt refused heart healthy/carb modified diet and requested regular diet  HLD: continue on statin   Tobacco abuse: nicotine patch to prevent w/drawal. Smoking cessation counseling     DVT prophylaxis: lovenox  Code Status: full  Family Communication:  Disposition Plan: d/c back home   Status is: Inpatient  Remains inpatient appropriate because:Ongoing diagnostic testing needed not appropriate for outpatient work up and IV treatments appropriate due to intensity of illness or inability to take PO   Dispo: The patient is from:  Home              Anticipated d/c is to: Home              Anticipated d/c date is: 2 days              Patient currently is not medically stable to d/c.   Difficult to place patient No        Consultants:   neuro   Procedures:    Antimicrobials:    Subjective: Pt c/o fatigue  Objective: Vitals:   06/06/20 0832 06/06/20 1700 06/06/20 2354 06/07/20 0437  BP: 139/83 131/67 (!) 142/75 133/77  Pulse: (!) 49 (!) 50 (!) 46 (!) 49  Resp: 18 16 16 20   Temp: 98.4 F (36.9 C) 98.4 F (36.9 C) 98.8 F (37.1 C) 98.7 F (37.1 C)  TempSrc: Oral Oral    SpO2: 100% 100% 100% 100%  Weight:        Intake/Output Summary (Last 24 hours) at 06/07/2020 0707 Last data filed at 06/06/2020 1614 Gross per 24 hour  Intake 1036.18 ml  Output 500 ml  Net 536.18 ml   Filed Weights   06/05/20 0813  Weight: 99.8 kg    Examination:  General exam: Obese. Appears comfortable  Respiratory system: clear breath sounds b/l Cardiovascular system: S1/S2+. No rubs or gallops  Gastrointestinal system: Abd is soft, NT, ND & hypoactive bowel sounds  Central nervous system: Alert and oriented. Moves all 4 extremities. Left facial droop Psychiatry: Judgement and insight appear normal. Mood & affect appropriate.     Data  Reviewed: I have personally reviewed following labs and imaging studies  CBC: Recent Labs  Lab 06/05/20 0426 06/06/20 0633  WBC 7.6 6.5  NEUTROABS 4.7  --   HGB 13.3 12.4*  HCT 42.0 39.9  MCV 81.1 81.9  PLT 317 950   Basic Metabolic Panel: Recent Labs  Lab 06/05/20 0426 06/06/20 0633  NA 142 139  K 3.6 3.5  CL 108 108  CO2 23 23  GLUCOSE 99 131*  BUN 19 13  CREATININE 0.73 0.67  CALCIUM 9.1 8.5*   GFR: Estimated Creatinine Clearance: 125.1 mL/min (by C-G formula based on SCr of 0.67 mg/dL). Liver Function Tests: Recent Labs  Lab 06/05/20 0426  AST 28  ALT 24  ALKPHOS 94  BILITOT 0.8  PROT 8.4*  ALBUMIN 4.3   No results for input(s): LIPASE,  AMYLASE in the last 168 hours. No results for input(s): AMMONIA in the last 168 hours. Coagulation Profile: Recent Labs  Lab 06/05/20 0426  INR 1.1   Cardiac Enzymes: Recent Labs  Lab 06/05/20 1144  CKTOTAL 412*   BNP (last 3 results) No results for input(s): PROBNP in the last 8760 hours. HbA1C: Recent Labs    06/06/20 0633  HGBA1C 5.6   CBG: No results for input(s): GLUCAP in the last 168 hours. Lipid Profile: Recent Labs    06/06/20 0633  CHOL 84  HDL 24*  LDLCALC 48  TRIG 58  CHOLHDL 3.5   Thyroid Function Tests: No results for input(s): TSH, T4TOTAL, FREET4, T3FREE, THYROIDAB in the last 72 hours. Anemia Panel: No results for input(s): VITAMINB12, FOLATE, FERRITIN, TIBC, IRON, RETICCTPCT in the last 72 hours. Sepsis Labs: No results for input(s): PROCALCITON, LATICACIDVEN in the last 168 hours.  No results found for this or any previous visit (from the past 240 hour(s)).       Radiology Studies: MR BRAIN W CONTRAST  Result Date: 06/05/2020 CLINICAL DATA:  Headache, chronic, no new features. Additional history provided: Left hemibody weakness and facial droop of unclear etiology, possible left-sided Bell's palsy. EXAM: MRI HEAD WITH CONTRAST TECHNIQUE: Multiplanar, multiecho pulse sequences of the brain and surrounding structures were obtained with intravenous contrast. CONTRAST:  10 mL Gadavist intravenous contrast. COMPARISON:  Noncontrast head CT and CT angiogram head/neck as well as CT perfusion 06/05/2020. FINDINGS: Postcontrast T1 weighted axial and coronal sequences were performed as a follow-up to the noncontrast brain MRI performed earlier today 06/05/2020. A precontrast T1 weighted sequence was also acquired. There is asymmetrically prominent enhancement along the course of the left facial nerve, most notably within the left ICA fundus and of the geniculate ganglion (series 2, image 46). No abnormal intracranial enhancement is identified elsewhere.  IMPRESSION: Asymmetrically prominent enhancement along the course of the left facial nerve, most notably within the left IAC fundus and at the level of the geniculate ganglion. Findings likely reflect sequela of Bell's palsy. However, close clinical follow-up is recommended (with follow-up imaging as warranted) to exclude alternative etiologies such as perineural tumor spread. No other sites no other sites of abnormal intracranial enhancement are identified. Electronically Signed   By: Kellie Simmering DO   On: 06/05/2020 13:21   MR CERVICAL SPINE W WO CONTRAST  Result Date: 06/05/2020 CLINICAL DATA:  Chronic neck pain and chronic left hemibody weakness and facial. EXAM: MRI CERVICAL SPINE WITHOUT AND WITH CONTRAST TECHNIQUE: Multiplanar and multiecho pulse sequences of the cervical spine, to include the craniocervical junction and cervicothoracic junction, were obtained without and with intravenous contrast. CONTRAST:  99mL GADAVIST GADOBUTROL 1 MMOL/ML IV SOLN COMPARISON:  Radiographs 06/05/2020 FINDINGS: Very limited examination due to motion artifact. Alignment: Normal Vertebrae: Normal marrow signal. No bone lesions or fractures. No areas of abnormal contrast enhancement. Cord: Possible linear high T2 signal intensity from C2-3 to C4-5. This could be a slightly prominent central canal or artifact. Posterior Fossa, vertebral arteries, paraspinal tissues: 5 no significant findings. Disc levels: C2-3: No significant findings. C3-4: No significant findings. C4-5: Diffuse bulging degenerated annulus and osteophytic ridging with flattening of the ventral thecal sac and suspected mild foraminal narrowing bilaterally. C5-6: Bulging annulus and osteophytic ridging with mild impression on the thecal sac and narrowing the ventral CSF space. No obvious foraminal stenosis. C6-7: Small central disc protrusion with minimal impression on the ventral thecal sac. No spinal or foraminal stenosis. C7-T1: No significant findings.  IMPRESSION: 1. Very limited examination due to motion artifact. 2. Possible linear high T2 signal intensity from C2-3 to C4-5 could be a slightly prominent central canal or artifact. 3. Diffuse bulging annulus and osteophytic ridging at C4-5 with mild spinal and bilateral foraminal stenosis suspected. 4. Bulging annulus and osteophytic ridging at C5-6 with mild impression on the thecal sac but no significant spinal or foraminal stenosis. 5. Small central disc protrusion at C6-7 but no significant spinal or foraminal stenosis. Electronically Signed   By: Marijo Sanes M.D.   On: 06/05/2020 13:27   DG Chest Port 1 View  Result Date: 06/05/2020 CLINICAL DATA:  Chest pain EXAM: PORTABLE CHEST 1 VIEW COMPARISON:  May 05, 2020 FINDINGS: Lungs are clear. The heart size and pulmonary vascularity are normal. No adenopathy. No pneumothorax. No bone lesions. IMPRESSION: Lungs clear.  Cardiac silhouette normal. Electronically Signed   By: Lowella Grip III M.D.   On: 06/05/2020 09:03        Scheduled Meds: . aspirin EC  81 mg Oral Daily  . atorvastatin  80 mg Oral QPM  . citalopram  20 mg Oral Daily  . diclofenac Sodium  4 g Topical QID  . enoxaparin (LOVENOX) injection  40 mg Subcutaneous Q24H  . famotidine  20 mg Oral BID  . gabapentin  400 mg Oral TID  . lidocaine  1 patch Transdermal Q24H  . magnesium oxide  400 mg Oral Daily  . nicotine  21 mg Transdermal Daily  . pantoprazole  40 mg Oral Daily   Continuous Infusions:    LOS: 1 day    Time spent: 30 mins    [name], MD [facility] Pager [phone]  If 7PM-7AM, please contact night-coverage 06/07/2020, 7:07 AM

## 2020-06-08 ENCOUNTER — Inpatient Hospital Stay: Payer: Self-pay

## 2020-06-08 LAB — PROTEIN ELECTROPHORESIS, SERUM
A/G Ratio: 1.1 (ref 0.7–1.7)
Albumin ELP: 3.4 g/dL (ref 2.9–4.4)
Alpha-1-Globulin: 0.2 g/dL (ref 0.0–0.4)
Alpha-2-Globulin: 0.7 g/dL (ref 0.4–1.0)
Beta Globulin: 1.1 g/dL (ref 0.7–1.3)
Gamma Globulin: 1 g/dL (ref 0.4–1.8)
Globulin, Total: 3 g/dL (ref 2.2–3.9)
Total Protein ELP: 6.4 g/dL (ref 6.0–8.5)

## 2020-06-08 LAB — BASIC METABOLIC PANEL
Anion gap: 6 (ref 5–15)
BUN: 14 mg/dL (ref 6–20)
CO2: 28 mmol/L (ref 22–32)
Calcium: 8.8 mg/dL — ABNORMAL LOW (ref 8.9–10.3)
Chloride: 107 mmol/L (ref 98–111)
Creatinine, Ser: 0.73 mg/dL (ref 0.61–1.24)
GFR, Estimated: >60 mL/min (ref >60)
Glucose, Bld: 103 mg/dL — ABNORMAL HIGH (ref 70–99)
Potassium: 3.6 mmol/L (ref 3.5–5.1)
Sodium: 141 mmol/L (ref 135–145)

## 2020-06-08 LAB — IMMUNOFIXATION ELECTROPHORESIS
IgA: 539 mg/dL — ABNORMAL HIGH (ref 90–386)
IgG (Immunoglobin G), Serum: 1135 mg/dL (ref 603–1613)
IgM (Immunoglobulin M), Srm: 36 mg/dL (ref 20–172)
Total Protein ELP: 6.4 g/dL (ref 6.0–8.5)

## 2020-06-08 LAB — CBC
HCT: 40.8 % (ref 39.0–52.0)
Hemoglobin: 13 g/dL (ref 13.0–17.0)
MCH: 26.4 pg (ref 26.0–34.0)
MCHC: 31.9 g/dL (ref 30.0–36.0)
MCV: 82.9 fL (ref 80.0–100.0)
Platelets: 318 10*3/uL (ref 150–400)
RBC: 4.92 MIL/uL (ref 4.22–5.81)
RDW: 15.1 % (ref 11.5–15.5)
WBC: 6.5 10*3/uL (ref 4.0–10.5)
nRBC: 0 % (ref 0.0–0.2)

## 2020-06-08 MED ORDER — MORPHINE SULFATE (PF) 2 MG/ML IV SOLN: 1.0000 mg | INTRAVENOUS | Status: DC | PRN

## 2020-06-08 MED ORDER — LISINOPRIL 5 MG PO TABS
5.0000 mg | ORAL_TABLET | Freq: Every day | ORAL | Status: DC
  Administered 2020-06-08 – 2020-06-10 (×3): 5 mg via ORAL
  Filled 2020-06-08 (×3): qty 1

## 2020-06-08 MED ORDER — BACITRACIN-NEOMYCIN-POLYMYXIN OINTMENT TUBE
TOPICAL_OINTMENT | Freq: Every day | CUTANEOUS | Status: DC | PRN
  Filled 2020-06-08: qty 14.17

## 2020-06-08 NOTE — Progress Notes (Signed)
Radiology is unable to perform LP today due to scheduling conflict. Dr. Jimmye Norman notified and requested to please request Neurology to perform LP. If unsuccessful please re-consult with radiology. The current order will be deleted.   Soyla Dryer, [place] [phone] 06/08/2020, 4:44 PM

## 2020-06-08 NOTE — TOC Initial Note (Signed)
Transition of Care [facility]) - Initial/Assessment Note    Patient Details  Name: Samuel Molina MRN: [id number] Date of Birth: 12-16-1960  Transition of [facility]) CM/SW Contact:    Shelbie Hutching, RN Phone Number: 06/08/2020, 1:41 PM  Clinical Narrative:                 Patient admitted to the hospital with left sided weakness, tested + for COVID.  RNCM was able to speak with patient via phone.  Patient reports that he is independent at home and lives with his sister.  Patient says that as soon as he gets out of the hospital he will be moving to [place] but his sister will be staying here in [place].  Patient says that he has Medicaid but there is no insurance on file with the hospital.  Patient does not have a PCP- since he is moving to [facility] will provide the patient with information on [employer].    Expected Discharge Plan: OP Rehab Barriers to Discharge: Continued Medical Work up   Patient Goals and CMS Choice        Expected Discharge Plan and Services Expected Discharge Plan: OP Rehab   Discharge Planning Services: CM Consult   Living arrangements for the past 2 months: Single Family Home                 DME Arranged: N/A         HH Arranged: NA          Prior Living Arrangements/Services Living arrangements for the past 2 months: Single Family Home Lives with:: Siblings Patient language and need for interpreter reviewed:: Yes Do you feel safe going back to the place where you live?: Yes      Need for Family Participation in Patient Care: Yes (Comment) Care giver support system in place?: Yes (comment) (sister and friends)   Criminal Activity/Legal Involvement Pertinent to Current Situation/Hospitalization: No - Comment as needed  Activities of Daily Living Home Assistive Devices/Equipment: None ADL Screening (condition at time of admission) Patient's cognitive ability adequate to safely complete daily activities?:  Yes Is the patient deaf or have difficulty hearing?: No Does the patient have difficulty seeing, even when wearing glasses/contacts?: No Does the patient have difficulty concentrating, remembering, or making decisions?: No Patient able to express need for assistance with ADLs?: Yes Does the patient have difficulty dressing or bathing?: Yes Independently performs ADLs?: Yes (appropriate for developmental age) Does the patient have difficulty walking or climbing stairs?: No Weakness of Legs: Left Weakness of Arms/Hands: Left  Permission Sought/Granted   Permission granted to share information with : No              Emotional Assessment   Attitude/Demeanor/Rapport: Engaged Affect (typically observed): Accepting Orientation: : Oriented to Self,Oriented to Place,Oriented to  Time,Oriented to Situation Alcohol / Substance Use: Illicit Drugs Psych Involvement: No (comment)  Admission diagnosis:  TIA (transient ischemic attack) [G45.9] Weakness [R53.1] Left-sided weakness [R53.1] Chest pain [R07.9] Nonintractable headache, unspecified chronicity pattern, unspecified headache type [R51.9] Patient Active Problem List   Diagnosis Date Noted  . TIA (transient ischemic attack) 06/05/2020  . Stroke ([place])   . Depression   . HTN (hypertension)   . HLD (hyperlipidemia)   . Tobacco abuse   . Chest pain   . Left-sided weakness   . COVID-19 virus infection   . Bell's palsy 04/26/2020  . Malingering 03/25/2020  . Personality disorder ([place]) 02/13/2020  .  Bursitis 02/13/2020  . Substance induced mood disorder ([place]) 02/12/2020  . Severe recurrent major depression without psychotic features ([place]) 01/21/2020  . Cocaine abuse ([place]) 01/21/2020  . Hip pain 01/21/2020  . Major depression 01/20/2020  . Gastroesophageal reflux disease with esophagitis   . Morbid obesity ([place]) 09/19/2017  . Arthritis of knee 11/03/2015   PCP:  Patient, No Pcp Per Pharmacy:   [facility] [address], [place] - [place] [address] Phone: [phone] Fax: [phone]  [place], [place] [address] Phone: [phone] Fax: [phone]     Social Determinants of Health (SDOH) Interventions    Readmission Risk Interventions Readmission Risk Prevention Plan 06/08/2020  Transportation Screening Complete  Medication Review (RN Care Manager) Complete  PCP or Specialist appointment within 3-5 days of discharge Not Complete  PCP/Specialist Appt Not Complete comments patient moving to [facility] or [place] Not Complete  HRI or Home Care Consult Pt Refusal Comments NA  SW Recovery Care/Counseling Consult Complete  Palliative Care Screening Not [place] Not Applicable  Some recent data might be hidden

## 2020-06-08 NOTE — Progress Notes (Signed)
PROGRESS NOTE    Samuel Molina  WJX:[id number] DOB: [birth year] DOA: 06/05/2020 PCP: Patient, No Pcp Per    Assessment & Plan:   Principal Problem:   Left-sided weakness Active Problems:   Gastroesophageal reflux disease with esophagitis   Cocaine abuse ([place])   TIA (transient ischemic attack)   Stroke ([place])   Depression   HTN (hypertension)   HLD (hyperlipidemia)   Tobacco abuse   Chest pain   COVID-19 virus infection   Left-sided weakness:  etiology unclear, possible TIA vs cocaine abuse. LP ordered by neuro & LP to be done sometime this afternoon. CT-head, MRI of brain, CTA of head and neck, and CT of cerebral perfusion are all negative for acute abnormalities. Continue on aspirin, statin. PT recs outpatient therapy. Pt refused heart healthy/carb modified diet and requested regular diet. Neuro following and recs apprec   Possible Bell's palsy: continue w/ supportive care. Neuro following    COVID-19 virus infection: positive test on 05/26/2020. CXR neg. Mild dry cough. No treatment indicated at this time   Chest pain: resolved. Likely secondary to cocaine abuse. Troponins neg   Cocaine abuse: illicit drug use cessation counseling   GERD: continue on H2 blocker, PPI   Hx of TIA & CVA: continue on aspirin, statin   Depression: severity unknown. Continue on home dose of celexa  HTN: will restart home dose of lisinopril. IV hydralazine prn. Pt refused heart healthy/carb modified diet and requested regular diet  HLD: continue on statin   Tobacco abuse: nicotine patch to prevent w/drawal. Smoking cessation counseling     DVT prophylaxis: lovenox  Code Status: full  Family Communication:  Disposition Plan: d/c back home   Status is: Inpatient  Remains inpatient appropriate because:Ongoing diagnostic testing needed not appropriate for outpatient work up and IV treatments appropriate due to intensity of illness or inability to take PO, waiting on LP to get  done and neuro clearance for d/c    Dispo: The patient is from: Home              Anticipated d/c is to: Home              Anticipated d/c date is: whenever neuro clears pt for d/c              Patient currently is not medically stable to d/c.   Difficult to place patient No        Consultants:   neuro   Procedures:    Antimicrobials:    Subjective: Pt c/o neck & shoulder pain   Objective: Vitals:   06/07/20 1614 06/07/20 1927 06/08/20 0023 06/08/20 0549  BP: (!) 147/57 (!) 143/81 (!) 145/64 (!) 108/41  Pulse: (!) 55 (!) 48 (!) 50 (!) 52  Resp:  16 14 15   Temp: 98 F (36.7 C) 97.6 F (36.4 C) 97.6 F (36.4 C) 98.4 F (36.9 C)  TempSrc: Oral Oral    SpO2: 98% 100% 100% 99%  Weight:       No intake or output data in the 24 hours ending 06/08/20 0729 Filed Weights   06/05/20 0813  Weight: 99.8 kg    Examination:  General exam: Obese. Appears comfortable  Respiratory system: clear breath sounds b/l Cardiovascular system: S1 & S2+. No rubs or gallops Gastrointestinal system: Abd is soft, obese, NT, & normal bowel sounds  Central nervous system: Alert and oriented. Moves all 4 extremities. Left facial droop Psychiatry: Judgement and insight appear normal. Mood & affect appropriate.  Data Reviewed: I have personally reviewed following labs and imaging studies  CBC: Recent Labs  Lab 06/05/20 0426 06/06/20 0633 06/07/20 0929 06/08/20 0606  WBC 7.6 6.5 7.6 6.5  NEUTROABS 4.7  --   --   --   HGB 13.3 12.4* 13.0 13.0  HCT 42.0 39.9 42.1 40.8  MCV 81.1 81.9 82.7 82.9  PLT 317 289 300 332   Basic Metabolic Panel: Recent Labs  Lab 06/05/20 0426 06/06/20 0633 06/07/20 0929 06/08/20 0606  NA 142 139 141 141  K 3.6 3.5 3.9 3.6  CL 108 108 105 107  CO2 23 23 26 28   GLUCOSE 99 131* 82 103*  BUN 19 13 12 14   CREATININE 0.73 0.67 0.74 0.73  CALCIUM 9.1 8.5* 8.8* 8.8*   GFR: Estimated Creatinine Clearance: 125.1 mL/min (by C-G formula based on  SCr of 0.73 mg/dL). Liver Function Tests: Recent Labs  Lab 06/05/20 0426  AST 28  ALT 24  ALKPHOS 94  BILITOT 0.8  PROT 8.4*  ALBUMIN 4.3   No results for input(s): LIPASE, AMYLASE in the last 168 hours. No results for input(s): AMMONIA in the last 168 hours. Coagulation Profile: Recent Labs  Lab 06/05/20 0426  INR 1.1   Cardiac Enzymes: Recent Labs  Lab 06/05/20 1144  CKTOTAL 412*   BNP (last 3 results) No results for input(s): PROBNP in the last 8760 hours. HbA1C: Recent Labs    06/06/20 0633  HGBA1C 5.6   CBG: No results for input(s): GLUCAP in the last 168 hours. Lipid Profile: Recent Labs    06/06/20 0633  CHOL 84  HDL 24*  LDLCALC 48  TRIG 58  CHOLHDL 3.5   Thyroid Function Tests: No results for input(s): TSH, T4TOTAL, FREET4, T3FREE, THYROIDAB in the last 72 hours. Anemia Panel: No results for input(s): VITAMINB12, FOLATE, FERRITIN, TIBC, IRON, RETICCTPCT in the last 72 hours. Sepsis Labs: No results for input(s): PROCALCITON, LATICACIDVEN in the last 168 hours.  Recent Results (from the past 240 hour(s))  Culture, blood (single) w Reflex to ID Panel     Status: None (Preliminary result)   Collection Time: 06/06/20  6:36 AM   Specimen: BLOOD RIGHT HAND  Result Value Ref Range Status   Specimen Description BLOOD RIGHT HAND  Final   Special Requests   Final    BOTTLES DRAWN AEROBIC AND ANAEROBIC Blood Culture results may not be optimal due to an excessive volume of blood received in culture bottles   Culture   Final    NO GROWTH 2 DAYS Performed at [facility], [address], [place], [place] [address]    Report Status PENDING  Incomplete         Radiology Studies: No results found.      Scheduled Meds: . aspirin EC  81 mg Oral Daily  . atorvastatin  80 mg Oral QPM  . citalopram  20 mg Oral Daily  . diclofenac Sodium  4 g Topical QID  . enoxaparin (LOVENOX) injection  40 mg Subcutaneous Q24H  . famotidine  20 mg  Oral BID  . gabapentin  400 mg Oral TID  . lidocaine  1 patch Transdermal Q24H  . magnesium oxide  400 mg Oral Daily  . nicotine  21 mg Transdermal Daily  . pantoprazole  40 mg Oral Daily  . QUEtiapine  100 mg Oral QHS   Continuous Infusions:    LOS: 2 days    Time spent: 28 mins    Junie Spencer M  Jimmye Norman, MD [facility] Pager [phone]  If 7PM-7AM, please contact night-coverage 06/08/2020, 7:29 AM

## 2020-06-09 DIAGNOSIS — L739 Follicular disorder, unspecified: Secondary | ICD-10-CM

## 2020-06-09 DIAGNOSIS — L02416 Cutaneous abscess of left lower limb: Secondary | ICD-10-CM

## 2020-06-09 DIAGNOSIS — R531 Weakness: Secondary | ICD-10-CM

## 2020-06-09 DIAGNOSIS — L02414 Cutaneous abscess of left upper limb: Secondary | ICD-10-CM

## 2020-06-09 LAB — CBC
HCT: 41.2 % (ref 39.0–52.0)
Hemoglobin: 13.4 g/dL (ref 13.0–17.0)
MCH: 27.4 pg (ref 26.0–34.0)
MCHC: 32.5 g/dL (ref 30.0–36.0)
MCV: 84.3 fL (ref 80.0–100.0)
Platelets: 336 10*3/uL (ref 150–400)
RBC: 4.89 MIL/uL (ref 4.22–5.81)
RDW: 15.7 % — ABNORMAL HIGH (ref 11.5–15.5)
WBC: 8.3 10*3/uL (ref 4.0–10.5)
nRBC: 0 % (ref 0.0–0.2)

## 2020-06-09 LAB — BASIC METABOLIC PANEL
Anion gap: 7 (ref 5–15)
BUN: 16 mg/dL (ref 6–20)
CO2: 28 mmol/L (ref 22–32)
Calcium: 9.2 mg/dL (ref 8.9–10.3)
Chloride: 106 mmol/L (ref 98–111)
Creatinine, Ser: 0.87 mg/dL (ref 0.61–1.24)
GFR, Estimated: >60 mL/min (ref >60)
Glucose, Bld: 91 mg/dL (ref 70–99)
Potassium: 4.5 mmol/L (ref 3.5–5.1)
Sodium: 141 mmol/L (ref 135–145)

## 2020-06-09 LAB — NEUROMYELITIS OPTICA AUTOAB, IGG: NMO-IgG: <1.5 U/mL (ref 0.0–3.0)

## 2020-06-09 LAB — VARICELLA ZOSTER ANTIBODY, IGG: Varicella IgG: <135 index — ABNORMAL LOW (ref >165)

## 2020-06-09 LAB — VARICELLA ZOSTER ANTIBODY, IGM: Varicella-Zoster Ab, IgM: <0.91 index (ref 0.00–0.90)

## 2020-06-09 MED ORDER — OXYCODONE HCL 5 MG PO TABS
10.0000 mg | ORAL_TABLET | ORAL | Status: DC | PRN
Start: 2020-06-09 — End: 2020-06-11
  Administered 2020-06-09 – 2020-06-11 (×9): 10 mg via ORAL
  Filled 2020-06-09 (×9): qty 2

## 2020-06-09 MED ORDER — CEPHALEXIN 500 MG PO CAPS
500.0000 mg | ORAL_CAPSULE | Freq: Four times a day (QID) | ORAL | Status: DC
  Administered 2020-06-09 – 2020-06-11 (×8): 500 mg via ORAL
  Filled 2020-06-09 (×7): qty 1

## 2020-06-09 NOTE — Progress Notes (Signed)
NEUROLOGY CONSULTATION PROGRESS NOTE   Date of service: June 09, 2020 Patient Name: Samuel Molina MRN:  [id number] DOB:  12/21/1960  Brief HPI   Serigne Kubicek is a 60 y.o. right-handed man with a past medical history significant for hypertension, family hx of vasculitis, ongoing tobacco and cocaine abuse, obesity, substance induced mood disorder, personality disorder, possible malingering, and musculoskeletal pain who presents with recurrence of left sided weakness. He initially presented on 12/9 to [facility] and was diagnosed with left-sided Bell's palsy but then presented to [facility] on 12/13 and was treated with tPA for concern for acute stroke due to left arm and leg weakness. MRI was not completed at that admission but since then he has had multiple representations for waxing and waning left-sided arm/leg symptoms; his facial weakness has remained stable.  Working diagnoses have included complex migraines, hypertensive emergency with recrudescence of prior stroke (no clear stroke on MRI). Workup here with MRI Brain and MRI C spine demonstrated enhancement of the 7th cranial nerve, unclear if residual/persistent inflammation from his bells palsy in Dec 2021 or new, data on imaging findings in such cases is somewhat rare. Plan is to get LP to assess for any CSF findings concerning for inflammation.   Interval Hx   Plan to get an LP on him. IR was unable to put him on schedule yesterday. He had his lovenox in AM today but hs is on schedule or an LP tomorrow.  Vitals   Vitals:   06/08/20 1326 06/08/20 1948 06/08/20 2344 06/09/20 0511  BP: (!) 149/50 (!) 153/64 (!) 137/56 137/63  Pulse: 64 60 (!) 59 (!) 56  Resp: 15 16 16 16   Temp: 98.7 F (37.1 C) 97.8 F (36.6 C) 97.8 F (36.6 C) 97.8 F (36.6 C)  TempSrc:  Oral Oral Oral  SpO2: 100% 100% 98% 100%  Weight:         Body mass index is 29.03 kg/m.  Physical Exam   General: Laying comfortably in bed; in no acute distress. HENT:  Normal oropharynx and mucosa. Normal external appearance of ears and nose. Neck: Supple, no pain or tenderness CV: No JVD. No peripheral edema. Pulmonary: Symmetric Chest rise. Normal respiratory effort. Abdomen: Soft to touch, non-tender. Ext: No cyanosis, edema, or deformity Skin: No rash. Normal palpation of skin.  Musculoskeletal: Normal digits and nails by inspection. No clubbing.  Neurologic Examination  Mental status/Cognition: Alert, oriented to self, place, month and year, good attention. Speech/language: Fluent, comprehension intact, object naming intact, repetition intact. Cranial nerves:   CN II Anisocoria with L pupil 42mm, R pupil 86mm, both round and reactive to light. No VF deficits   CN III,IV,VI EOM intact, no gaze preference or deviation, no nystagmus   CN V Decreased in Left V1,2,3 to touch to about 60-70% compared to R face.   CN VII L upper and lower facial droop with weak l eye closure.   CN VIII normal hearing to speech   CN IX & X normal palatal elevation, no uvular deviation   CN XI 5/5 head turn and 5/5 shoulder shrug bilaterally   CN XII midline tongue protrusion   Motor:  Muscle bulk: normal, tone normal, pronator drift LUE drift, tremor none Mvmt Root Nerve  Muscle Right Left Comments  SA C5/6 Ax Deltoid 5 4+   EF C5/6 Mc Biceps 5 4+   EE C6/7/8 Rad Triceps 5 4+   WF C6/7 Med FCR     WE C7/8 PIN ECU  F Ab C8/T1 U ADM/FDI 5 4+   HF L1/2/3 Fem Illopsoas 5 4   KE L2/3/4 Fem Quad     DF L4/5 D Peron Tib Ant 5 4+   PF S1/2 Tibial Grc/Sol 5 4+    Reflexes:  Right Left Comments  Pectoralis      Biceps (C5/6) 2 2   Brachioradialis (C5/6) 2 2    Triceps (C6/7) 2 2    Patellar (L3/4) 2 2    Achilles (S1)      Hoffman      Plantar     Jaw jerk    Sensation:  Light touch    Pin prick    Temperature    Vibration   Proprioception    Coordination/Complex Motor:  - Finger to Nose intact BL  Labs   Basic Metabolic Panel:  Lab Results   Component Value Date   NA 141 06/09/2020   K 4.5 06/09/2020   CO2 28 06/09/2020   GLUCOSE 91 06/09/2020   BUN 16 06/09/2020   CREATININE 0.87 06/09/2020   CALCIUM 9.2 06/09/2020   GFRNONAA >60 06/09/2020   GFRAA >60 01/20/2020   HbA1c:  Lab Results  Component Value Date   HGBA1C 5.6 06/06/2020   LDL:  Lab Results  Component Value Date   LDLCALC 48 06/06/2020   Urine Drug Screen:     Component Value Date/Time   LABOPIA NONE DETECTED 06/05/2020 0619   LABOPIA NONE DETECTED 05/05/2020 1053   COCAINSCRNUR POSITIVE (A) 06/05/2020 0619   LABBENZ NONE DETECTED 06/05/2020 0619   LABBENZ NONE DETECTED 05/05/2020 1053   AMPHETMU NONE DETECTED 06/05/2020 0619   AMPHETMU NONE DETECTED 05/05/2020 1053   THCU NONE DETECTED 06/05/2020 0619   THCU NONE DETECTED 05/05/2020 1053   LABBARB NONE DETECTED 06/05/2020 0619   LABBARB NONE DETECTED 05/05/2020 1053    Alcohol Level     Component Value Date/Time   ETH <10 06/05/2020 0426   No results found for: PHENYTOIN, ZONISAMIDE, LAMOTRIGINE, LEVETIRACETA No results found for: PHENYTOIN, PHENOBARB, VALPROATE, CBMZ  Imaging and Diagnostic studies   Significant labs: IFX: with a polycloncal IgA increase, no monoclonal spike thou. Hep B Core Ab is positive unclear what the significance is in the absence of Surface Ag and Surface Ab. Rest of the serum studies ordered by our team are either pending or negative.  MRI Brain with contrast: Asymmetrically prominent enhancement along the course of the left facial nerve, most notably within the left IAC fundus and at the level of the geniculate ganglion. Findings likely reflect sequela of Bell's palsy. However, close clinical follow-up is recommended (with follow-up imaging as warranted) to exclude alternative etiologies such as perineural tumor spread.  MRI C spine with and without contrast: 1. Very limited examination due to motion artifact. 2. Possible linear high T2 signal intensity  from C2-3 to C4-5 could be a slightly prominent central canal or artifact. 3. Diffuse bulging annulus and osteophytic ridging at C4-5 with mild spinal and bilateral foraminal stenosis suspected. 4. Bulging annulus and osteophytic ridging at C5-6 with mild impression on the thecal sac but no significant spinal or foraminal stenosis. 5. Small central disc protrusion at C6-7 but no significant spinal or foraminal stenosis.  Impression   Pepper Wyndham is a 60 y.o. male admitted with L Bells palsy along with recurrence of his left sided weakness with hiccups, headaches. Workup with MRI B and C spine with enhancement of the left facial nerve unclear if residual/persistent inflammation from  his bells palsy in Dec 2021 or new, data on imaging findings in such cases is somewhat rare. Serum studies so far have essentially been non revealing.  Not entirely sure what to make of his exam with the Left sided weakness. I do agree that there is some concern that the left arm and leg weakness is partially effort dependent. I do think that if his LP is not particulary concerning for CNS inflammation, this can be safely worked up outpatient.  Recommendations  - Plan is to get LP with IR tomorrow. Lovenox on hold and NPO midnight. Confirmed with IR and he is on schedule early morning for 2/17. - I ordered CSF Cell count and diff x 2, Lyme PCR, HSV PCR, VZV PCR, Cytology, Oligoclonal bands and IgG index. - If Preliminary CSF studies are not concerning for a potenial inflammation, recommend close follow up with neurology outpatient - Recommend repeat MRI Brain with and without contrast in about 6 weeks from his last MRI Brain to assess for any worsening of the noted facial nerve enhancement. ______________________________________________________________________   Thank you for the opportunity to take part in the care of this patient. If you have any further questions, please contact the neurology consultation  attending.  Signed,  [place] Pager Number [phone]

## 2020-06-09 NOTE — Consult Note (Signed)
NAME: Samuel Molina  DOB: [birth year]  MRN: [id number]  Date/Time: 06/09/2020 3:07 PM  REQUESTING PROVIDER: Dr.Williams Subjective:  REASON FOR CONSULT: bumps on his skin ? Samuel Molina is a 60 y.o. with a history of anxiety, depression, substance use, GERD, left-sided weakness, Patient presented to the ED on 06/05/2020 with acute episode of left-sided weakness when he was at [place] and could not walk. Vitals were BP 153/85, temperature 97.6, heart rate 53, respiratory rate 18, sats 100%.  Labs revealed WBC of 7.6, Hb 13.3, platelet 317, creatinine 0.73. Urine tox positive for cocaine. CT scan of the head did not show any acute findings.  CTA was normal.  MRI of the brain showed asymmetrically prominent enhancement along the course of the left facial nerve most notable within the left IAC fundus at the level of geniculate ganglion.  Findings likely reflect sequelae of old Bell's palsy.. Patient was seen by neurology I am asked to see the patient for skin lesions Pt says he has had these skin pustules showing up in the past 3 weeks Never had this before Does not skin pop tested positive for covid on 2/2  ID  NO Steroid/immune suppressants/splenectomy/Hardware NO Recent Procedure NO Antibiotic use  Past Medical History:  Diagnosis Date  . Anxiety   . Depression   . GERD (gastroesophageal reflux disease)   . Obesity   . Stroke ([place])   . Substance abuse [facility])     Past Surgical History:  Procedure Laterality Date  . ESOPHAGOGASTRODUODENOSCOPY (EGD) WITH PROPOFOL N/A 09/20/2017   Procedure: ESOPHAGOGASTRODUODENOSCOPY (EGD) WITH PROPOFOL;  Surgeon: Lin Landsman, MD;  Location: [place];  Service: Gastroenterology;  Laterality: N/A;  . KNEE SURGERY Right     Social History   Socioeconomic History  . Marital status: Single    Spouse name: Not on file  . Number of children: Not on file  . Years of education: Not on file  . Highest education level: Not on file   Occupational History  . Not on file  Tobacco Use  . Smoking status: Current Every Day Smoker    Packs/day: 0.50    Types: Cigarettes  . Smokeless tobacco: Never Used  Vaping Use  . Vaping Use: Never used  Substance and Sexual Activity  . Alcohol use: Not Currently  . Drug use: Yes    Types: Cocaine  . Sexual activity: Not on file  Other Topics Concern  . Not on file  Social History Narrative  . Not on file   Social Determinants of Health   Financial Resource Strain: Not on file  Food Insecurity: Not on file  Transportation Needs: Not on file  Physical Activity: Not on file  Stress: Not on file  Social Connections: Not on file  Intimate Partner Violence: Not on file    Family History  Problem Relation Age of Onset  . Vasculitis Mother    Allergies  Allergen Reactions  . Acetaminophen Itching, Nausea And Vomiting and Rash   I? Current Facility-Administered Medications  Medication Dose Route Frequency Provider Last Rate Last Admin  . albuterol (VENTOLIN HFA) 108 (90 Base) MCG/ACT inhaler 2 puff  2 puff Inhalation Q4H PRN Ivor Costa, MD      . aspirin EC tablet 81 mg  81 mg Oral Daily Ivor Costa, MD   81 mg at 06/09/20 0904  . atorvastatin (LIPITOR) tablet 80 mg  80 mg Oral QPM Ivor Costa, MD   80 mg at 06/08/20 1842  . baclofen (LIORESAL) tablet  5 mg  5 mg Oral TID PRN Lesleigh Noe L, MD   5 mg at 06/07/20 0839  . citalopram (CELEXA) tablet 20 mg  20 mg Oral Daily Ivor Costa, MD   20 mg at 06/09/20 0905  . dextromethorphan-guaiFENesin (MUCINEX DM) 30-600 MG per 12 hr tablet 1 tablet  1 tablet Oral BID PRN Ivor Costa, MD      . diclofenac Sodium (VOLTAREN) 1 % topical gel 4 g  4 g Topical QID Ivor Costa, MD   4 g at 06/09/20 0905  . famotidine (PEPCID) tablet 20 mg  20 mg Oral BID Ivor Costa, MD   20 mg at 06/09/20 0906  . gabapentin (NEURONTIN) capsule 400 mg  400 mg Oral TID Ivor Costa, MD   400 mg at 06/09/20 0904  . lidocaine (LIDODERM) 5 % 1 patch  1 patch  Transdermal Q24H Ivor Costa, MD   1 patch at 06/09/20 0905  . lisinopril (ZESTRIL) tablet 5 mg  5 mg Oral Daily [name], MD   5 mg at 06/09/20 8588  . magnesium oxide (MAG-OX) tablet 400 mg  400 mg Oral Daily Ivor Costa, MD   400 mg at 06/09/20 0906  . morphine 2 MG/ML injection 1 mg  1 mg Intravenous Q4H PRN [name], MD      . neomycin-bacitracin-polymyxin (NEOSPORIN) ointment   Topical Daily PRN [name], MD      . nicotine (NICODERM CQ - dosed in mg/24 hours) patch 21 mg  21 mg Transdermal Daily Ivor Costa, MD      . ondansetron [facility]) tablet 4 mg  4 mg Oral Q6H PRN Ivor Costa, MD      . oxyCODONE (Oxy IR/ROXICODONE) immediate release tablet 10 mg  10 mg Oral Q4H PRN [name], MD   10 mg at 06/09/20 0904  . pantoprazole (PROTONIX) EC tablet 40 mg  40 mg Oral Daily Ivor Costa, MD   40 mg at 06/09/20 5027  . QUEtiapine (SEROQUEL) tablet 100 mg  100 mg Oral QHS [name], MD   100 mg at 06/08/20 2047  . senna-docusate (Senokot-S) tablet 1 tablet  1 tablet Oral QHS PRN Ivor Costa, MD         Abtx:  Anti-infectives (From admission, onward)   None      REVIEW OF SYSTEMS:  Const: negative fever, negative chills, negative weight loss Eyes: negative diplopia or visual changes, negative eye pain ENT: negative coryza, negative sore throat Resp: negative cough, hemoptysis, dyspnea Cards: negative for chest pain, palpitations, lower extremity edema GU: negative for frequency, dysuria and hematuria GI: Negative for abdominal pain, diarrhea, bleeding, constipation Skin: small skin lesions Heme: negative for easy bruising and gum/nose bleeding MS: negative for myalgias, arthralgias, back pain and muscle weakness Neurolo:weakness left side and weakness left side of face Psych: negative for feelings of anxiety, depression  Endocrine: negative for thyroid, diabetes Allergy/Immunology- acetaminophen Objective:  VITALS:  BP (!) 183/91 (BP  Location: Right Arm)   Pulse 65   Temp 97.9 F (36.6 C) (Oral)   Resp 18   Wt 99.8 kg   SpO2 100%   BMI 29.03 kg/m  PHYSICAL EXAM:  General: Alert, cooperative, no distress, appears stated age.  Head: Normocephalic, without obvious abnormality, atraumatic. Eyes: Conjunctivae clear, anicteric sclerae. Pupils are equal ENT Nares normal. No drainage or sinus tenderness. Lips, mucosa, and tongue normal. No Thrush Neck: Supple, symmetrical, no adenopathy, thyroid: non tender no carotid bruit  and no JVD. Back: No CVA tenderness. Lungs: Clear to auscultation bilaterally. No Wheezing or Rhonchi. No rales. Heart: Regular rate and rhythm, no murmur, rub or gallop. Abdomen: Soft, non-tender,not distended. Bowel sounds normal. No masses Extremities: atraumatic, no cyanosis. No edema. No clubbing Skin: pustular lesion - small on the left arm and healing lesion left keg Lymph: Cervical, supraclavicular normal. Neurologic:left facial palsy Pertinent Labs Lab Results CBC    Component Value Date/Time   WBC 8.3 06/09/2020 0407   RBC 4.89 06/09/2020 0407   HGB 13.4 06/09/2020 0407   HCT 41.2 06/09/2020 0407   PLT 336 06/09/2020 0407   MCV 84.3 06/09/2020 0407   MCH 27.4 06/09/2020 0407   MCHC 32.5 06/09/2020 0407   RDW 15.7 (H) 06/09/2020 0407   LYMPHSABS 1.8 06/05/2020 0426   MONOABS 1.0 06/05/2020 0426   EOSABS 0.0 06/05/2020 0426   BASOSABS 0.0 06/05/2020 0426    CMP Latest Ref Rng & Units 06/09/2020 06/08/2020 06/07/2020  Glucose 70 - 99 mg/dL 91 103(H) 82  BUN 6 - 20 mg/dL 16 14 12   Creatinine 0.61 - 1.24 mg/dL 0.87 0.73 0.74  Sodium 135 - 145 mmol/L 141 141 141  Potassium 3.5 - 5.1 mmol/L 4.5 3.6 3.9  Chloride 98 - 111 mmol/L 106 107 105  CO2 22 - 32 mmol/L 28 28 26   Calcium 8.9 - 10.3 mg/dL 9.2 8.8(L) 8.8(L)  Total Protein 6.5 - 8.1 g/dL - - -  Total Bilirubin 0.3 - 1.2 mg/dL - - -  Alkaline Phos 38 - 126 U/L - - -  AST 15 - 41 U/L - - -  ALT 0 - 44 U/L - - -       Microbiology: Recent Results (from the past 240 hour(s))  Culture, blood (single) w Reflex to ID Panel     Status: None (Preliminary result)   Collection Time: 06/06/20  6:36 AM   Specimen: BLOOD RIGHT HAND  Result Value Ref Range Status   Specimen Description BLOOD RIGHT HAND  Final   Special Requests   Final    BOTTLES DRAWN AEROBIC AND ANAEROBIC Blood Culture results may not be optimal due to an excessive volume of blood received in culture bottles   Culture   Final    NO GROWTH 3 DAYS Performed at [facility], [address] [place], [place] [address]    Report Status PENDING  Incomplete    IMAGING RESULTS: I have personally reviewed the films ? Impression/Recommendation ? Folliculitis/skin abscess likely due to staph aureus- currently nothing to culture Will get MSSA culture of nares Will give keflex for 5 days along with mupirocin ointment topically  Pt admitted for left sided weakness  H/o CVA left hemiparesis Pt being investigated by neuro for facial palsy  Cocaine use, does not skin POP  Discussed the management with the patient ID will sign off- call if needed  ? ? ___________________________________________________ Discussed with patient, requesting provider Note:  This document was prepared using Dragon voice recognition software and may include unintentional dictation errors.

## 2020-06-09 NOTE — Progress Notes (Signed)
PROGRESS NOTE    Samuel Molina  KVQ:[id number] DOB: 08/30/1960 DOA: 06/05/2020 PCP: Patient, No Pcp Per    Assessment & Plan:   Principal Problem:   Left-sided weakness Active Problems:   Gastroesophageal reflux disease with esophagitis   Cocaine abuse ([place])   TIA (transient ischemic attack)   Stroke ([place])   Depression   HTN (hypertension)   HLD (hyperlipidemia)   Tobacco abuse   Chest pain   COVID-19 virus infection   Left-sided weakness:  etiology unclear, possible secondary to cocaine abuse. LP was not done yesterday secondary to scheduling conflict and will not be done today secondary to pt receiving lovenox. Lovenox has been d/c and pt will be NPO after midnight to have LP tomorrow morning. CT-head, MRI of brain, CTA of head and neck, and CT of cerebral perfusion are all negative for acute abnormalities. Continue on aspirin, statin. PT recs outpatient therapy. Pt refused heart healthy/carb modified diet and requested regular diet. Neuro following and recs apprec   Possible Bell's palsy: continue w/ supportive care. Neuro following   COVID-19 virus infection: positive test on 05/26/2020. CXR neg. Mild dry cough. 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 Unlikely infectious. No systemic signs of infection. ID consulted   Chest pain: resolved. Likely secondary to cocaine abuse. Troponins neg   Cocaine abuse: illicit drug abuse cessation counseling   GERD: continue on pepcid and pantoprazole   Hx of TIA & CVA: continue on aspirin, statin   Depression: severity unknown. Continue on home dose of celexa  HTN: will restart home dose of lisinopril. IV hydralazine prn. Pt refused heart healthy/carb modified diet and requested regular diet  HLD: continue on statin   Tobacco abuse: nicotine patch to prevent w/drawal. Smoking cessation counseling      DVT prophylaxis: lovenox  Code Status: full  Family Communication:  Disposition Plan: d/c back home   Status is: Inpatient  Remains inpatient appropriate because:Ongoing diagnostic testing needed not appropriate for outpatient work up and IV treatments appropriate due to intensity of illness or inability to take PO, still waiting on LP to get done and neuro to clear the pt for d/c    Dispo: The patient is from: Home              Anticipated d/c is to: Home              Anticipated d/c date is: whenever neuro clears pt for d/c              Patient currently is not medically stable to d/c.   Difficult to place patient No        Consultants:   Neuro  ID   Procedures:    Antimicrobials:    Subjective: Pt c/o neck & shoulder pain   Objective: Vitals:   06/08/20 1326 06/08/20 1948 06/08/20 2344 06/09/20 0511  BP: (!) 149/50 (!) 153/64 (!) 137/56 137/63  Pulse: 64 60 (!) 59 (!) 56  Resp: 15 16 16 16   Temp: 98.7 F (37.1 C) 97.8 F (36.6 C) 97.8 F (36.6 C) 97.8 F (36.6 C)  TempSrc:  Oral Oral Oral  SpO2: 100% 100% 98% 100%  Weight:       No intake or output data in the 24 hours ending 06/09/20 0717 Filed Weights   06/05/20 0813  Weight: 99.8 kg    Examination:  General exam: Appears comfortable. Obese  Respiratory system: clear breath sounds b/l  Cardiovascular system: S1/S2+. No rubs or gallops  Gastrointestinal system: Abd is soft, NT, ND & hypotension  Central nervous system: Alert and oriented. Moves all 4 extremities. Left facial droop Psychiatry: Judgement and insight appear normal. Flat mood and affect      Data Reviewed: I have personally reviewed following labs and imaging studies  CBC: Recent Labs  Lab 06/05/20 0426 06/06/20 0633 06/07/20 0929 06/08/20 0606 06/09/20 0407  WBC 7.6 6.5 7.6 6.5 8.3  NEUTROABS 4.7  --   --   --   --   HGB 13.3 12.4* 13.0 13.0 13.4  HCT 42.0 39.9 42.1 40.8 41.2  MCV 81.1 81.9 82.7 82.9 84.3   PLT 317 289 300 318 161   Basic Metabolic Panel: Recent Labs  Lab 06/05/20 0426 06/06/20 0633 06/07/20 0929 06/08/20 0606 06/09/20 0407  NA 142 139 141 141 141  K 3.6 3.5 3.9 3.6 4.5  CL 108 108 105 107 106  CO2 23 23 26 28 28   GLUCOSE 99 131* 82 103* 91  BUN 19 13 12 14 16   CREATININE 0.73 0.67 0.74 0.73 0.87  CALCIUM 9.1 8.5* 8.8* 8.8* 9.2   GFR: Estimated Creatinine Clearance: 115.1 mL/min (by C-G formula based on SCr of 0.87 mg/dL). Liver Function Tests: Recent Labs  Lab 06/05/20 0426  AST 28  ALT 24  ALKPHOS 94  BILITOT 0.8  PROT 8.4*  ALBUMIN 4.3   No results for input(s): LIPASE, AMYLASE in the last 168 hours. No results for input(s): AMMONIA in the last 168 hours. Coagulation Profile: Recent Labs  Lab 06/05/20 0426  INR 1.1   Cardiac Enzymes: Recent Labs  Lab 06/05/20 1144  CKTOTAL 412*   BNP (last 3 results) No results for input(s): PROBNP in the last 8760 hours. HbA1C: No results for input(s): HGBA1C in the last 72 hours. CBG: No results for input(s): GLUCAP in the last 168 hours. Lipid Profile: No results for input(s): CHOL, HDL, LDLCALC, TRIG, CHOLHDL, LDLDIRECT in the last 72 hours. Thyroid Function Tests: No results for input(s): TSH, T4TOTAL, FREET4, T3FREE, THYROIDAB in the last 72 hours. Anemia Panel: No results for input(s): VITAMINB12, FOLATE, FERRITIN, TIBC, IRON, RETICCTPCT in the last 72 hours. Sepsis Labs: No results for input(s): PROCALCITON, LATICACIDVEN in the last 168 hours.  Recent Results (from the past 240 hour(s))  Culture, blood (single) w Reflex to ID Panel     Status: None (Preliminary result)   Collection Time: 06/06/20  6:36 AM   Specimen: BLOOD RIGHT HAND  Result Value Ref Range Status   Specimen Description BLOOD RIGHT HAND  Final   Special Requests   Final    BOTTLES DRAWN AEROBIC AND ANAEROBIC Blood Culture results may not be optimal due to an excessive volume of blood received in culture bottles   Culture    Final    NO GROWTH 2 DAYS Performed at [facility], [address] [place], [place] [address]    Report Status PENDING  Incomplete         Radiology Studies: No results found.      Scheduled Meds: . aspirin EC  81 mg Oral Daily  . atorvastatin  80 mg Oral QPM  . citalopram  20 mg Oral Daily  . diclofenac Sodium  4 g Topical QID  . enoxaparin (LOVENOX) injection  40 mg Subcutaneous Q24H  . famotidine  20 mg Oral BID  . gabapentin  400 mg Oral TID  . lidocaine  1 patch Transdermal Q24H  . lisinopril  5 mg Oral Daily  . magnesium oxide  400 mg Oral Daily  . nicotine  21 mg Transdermal Daily  . pantoprazole  40 mg Oral Daily  . QUEtiapine  100 mg Oral QHS   Continuous Infusions:    LOS: 3 days    Time spent: 33 mins    [name], MD [facility] Pager [phone]  If 7PM-7AM, please contact night-coverage 06/09/2020, 7:17 AM

## 2020-06-10 ENCOUNTER — Inpatient Hospital Stay: Payer: Self-pay

## 2020-06-10 LAB — CSF CELL COUNT WITH DIFFERENTIAL
Eosinophils, CSF: 0 %
Eosinophils, CSF: 0 %
Lymphs, CSF: 94 %
Lymphs, CSF: 94 %
Monocyte-Macrophage-Spinal Fluid: 6 %
Monocyte-Macrophage-Spinal Fluid: 6 %
RBC Count, CSF: 0 /mm3 (ref 0–3)
RBC Count, CSF: 13 /mm3 — ABNORMAL HIGH (ref 0–3)
Segmented Neutrophils-CSF: 0 %
Segmented Neutrophils-CSF: 0 %
Tube #: 1
Tube #: 4
WBC, CSF: 13 /mm3 (ref 0–5)
WBC, CSF: 8 /mm3 — ABNORMAL HIGH (ref 0–5)

## 2020-06-10 LAB — BASIC METABOLIC PANEL
Anion gap: 9 (ref 5–15)
BUN: 15 mg/dL (ref 6–20)
CO2: 29 mmol/L (ref 22–32)
Calcium: 9.1 mg/dL (ref 8.9–10.3)
Chloride: 102 mmol/L (ref 98–111)
Creatinine, Ser: 0.74 mg/dL (ref 0.61–1.24)
GFR, Estimated: >60 mL/min (ref >60)
Glucose, Bld: 88 mg/dL (ref 70–99)
Potassium: 4.1 mmol/L (ref 3.5–5.1)
Sodium: 140 mmol/L (ref 135–145)

## 2020-06-10 LAB — PROTIME-INR
INR: 1 (ref 0.8–1.2)
Prothrombin Time: 12.5 seconds (ref 11.4–15.2)

## 2020-06-10 LAB — CRYOGLOBULIN

## 2020-06-10 LAB — CBC
HCT: 40.9 % (ref 39.0–52.0)
Hemoglobin: 13.3 g/dL (ref 13.0–17.0)
MCH: 27.5 pg (ref 26.0–34.0)
MCHC: 32.5 g/dL (ref 30.0–36.0)
MCV: 84.5 fL (ref 80.0–100.0)
Platelets: 340 10*3/uL (ref 150–400)
RBC: 4.84 MIL/uL (ref 4.22–5.81)
RDW: 15.9 % — ABNORMAL HIGH (ref 11.5–15.5)
WBC: 7.2 10*3/uL (ref 4.0–10.5)
nRBC: 0 % (ref 0.0–0.2)

## 2020-06-10 LAB — PROTEIN AND GLUCOSE, CSF
Glucose, CSF: 58 mg/dL (ref 40–70)
Total  Protein, CSF: 38 mg/dL (ref 15–45)

## 2020-06-10 LAB — APTT: aPTT: 30 seconds (ref 24–36)

## 2020-06-10 NOTE — Progress Notes (Addendum)
PROGRESS NOTE    Samuel Molina  CLE:[id number] DOB: 1960-04-28 DOA: 06/05/2020 PCP: Patient, No Pcp Per    Assessment & Plan:   Principal Problem:   Left-sided weakness Active Problems:   Gastroesophageal reflux disease with esophagitis   Cocaine abuse ([place])   TIA (transient ischemic attack)   Stroke ([place])   Depression   HTN (hypertension)   HLD (hyperlipidemia)   Tobacco abuse   Chest pain   COVID-19 virus infection   Left-sided weakness:  etiology unclear, possible secondary to cocaine abuse. S/p LP done 06/10/20 w/ a non-inflammatory looking CSF. Other CSF tests pt can f/u outpatient w/ neurology to get the results. C/o back pain after LP. CT-head, MRI of brain, CTA of head and neck, and CT of cerebral perfusion are all negative for acute abnormalities. Continue on aspirin, statin. PT recs outpatient therapy. Pt refused heart healthy/carb modified diet and requested regular diet. Neuro following and recs apprec   Possible Bell's palsy: continue w/ supportive. Neuro is following   COVID-19 virus infection: positive test on 05/26/2020. CXR neg. Mild dry cough. No treatment is currently indicated   Neck & shoulder pain: continue on oxycodone. Likely secondary to disc & endplate degeneration of C-spine as per CT   Skin lesions: secondary to folliculitis as per ID. Keflex x 5 days as per ID   Chest pain: resolved. Likely secondary to cocaine abuse. Troponins neg   Cocaine abuse: illicit drug abuse cessation counseling   GERD: continue on PPI and H2 blockers   Hx of TIA & CVA: continue on aspirin, statin    Depression: severity unknown. Continue on home dose of celexa   HTN: continue on home dose of lisinopril. IV hydralazine prn. Pt refused heart healthy/carb modified diet and requested regular diet  HLD: continue on statin   Tobacco abuse: nicotine patch to prevent w/drawal. Smoking cessation counseling     DVT prophylaxis: lovenox  Code Status: full   Family Communication:  Disposition Plan: d/c back home   Status is: Inpatient  Remains inpatient appropriate because:Ongoing diagnostic testing needed not appropriate for outpatient work up and IV treatments appropriate due to intensity of illness or inability to take PO,    Dispo: The patient is from: Home              Anticipated d/c is to: Home              Anticipated d/c date is: d/c tomorrow morning               Patient currently is not medically stable to d/c.   Difficult to place patient No        Consultants:   Neuro  ID   Procedures:    Antimicrobials:    Subjective: Pt c/o a lot of back after LP was done  Objective: Vitals:   06/09/20 1659 06/09/20 2134 06/09/20 2330 06/10/20 0425  BP: (!) 132/59 (!) 148/92 (!) 136/94 107/64  Pulse: (!) 59 65 75 (!) 56  Resp:  16 20 18   Temp: 97.9 F (36.6 C) 99.4 F (37.4 C) 98.6 F (37 C) 98 F (36.7 C)  TempSrc: Oral  Oral Oral  SpO2: 100% 100% 99% 99%  Weight:       No intake or output data in the 24 hours ending 06/10/20 0727 Filed Weights   06/05/20 0813  Weight: 99.8 kg    Examination:  General exam: Obese. Appears uncomfortable  Respiratory system: clear breath sounds b/l  Cardiovascular system: S1/S2+. No rubs or gallops  Gastrointestinal system: Abd is soft, NT, obese & normal bowel sounds  Central nervous system: Alert and oriented. Moves all 4 extremities. Left facial droop Psychiatry: Judgement and insight appear normal. Flat mood and affect      Data Reviewed: I have personally reviewed following labs and imaging studies  CBC: Recent Labs  Lab 06/05/20 0426 06/06/20 0633 06/07/20 0929 06/08/20 0606 06/09/20 0407 06/10/20 0554  WBC 7.6 6.5 7.6 6.5 8.3 7.2  NEUTROABS 4.7  --   --   --   --   --   HGB 13.3 12.4* 13.0 13.0 13.4 13.3  HCT 42.0 39.9 42.1 40.8 41.2 40.9  MCV 81.1 81.9 82.7 82.9 84.3 84.5  PLT 317 289 300 318 336 202   Basic Metabolic Panel: Recent Labs  Lab  06/06/20 0633 06/07/20 0929 06/08/20 0606 06/09/20 0407 06/10/20 0554  NA 139 141 141 141 140  K 3.5 3.9 3.6 4.5 4.1  CL 108 105 107 106 102  CO2 23 26 28 28 29   GLUCOSE 131* 82 103* 91 88  BUN 13 12 14 16 15   CREATININE 0.67 0.74 0.73 0.87 0.74  CALCIUM 8.5* 8.8* 8.8* 9.2 9.1   GFR: Estimated Creatinine Clearance: 125.1 mL/min (by C-G formula based on SCr of 0.74 mg/dL). Liver Function Tests: Recent Labs  Lab 06/05/20 0426  AST 28  ALT 24  ALKPHOS 94  BILITOT 0.8  PROT 8.4*  ALBUMIN 4.3   No results for input(s): LIPASE, AMYLASE in the last 168 hours. No results for input(s): AMMONIA in the last 168 hours. Coagulation Profile: Recent Labs  Lab 06/05/20 0426 06/10/20 0554  INR 1.1 1.0   Cardiac Enzymes: Recent Labs  Lab 06/05/20 1144  CKTOTAL 412*   BNP (last 3 results) No results for input(s): PROBNP in the last 8760 hours. HbA1C: No results for input(s): HGBA1C in the last 72 hours. CBG: No results for input(s): GLUCAP in the last 168 hours. Lipid Profile: No results for input(s): CHOL, HDL, LDLCALC, TRIG, CHOLHDL, LDLDIRECT in the last 72 hours. Thyroid Function Tests: No results for input(s): TSH, T4TOTAL, FREET4, T3FREE, THYROIDAB in the last 72 hours. Anemia Panel: No results for input(s): VITAMINB12, FOLATE, FERRITIN, TIBC, IRON, RETICCTPCT in the last 72 hours. Sepsis Labs: No results for input(s): PROCALCITON, LATICACIDVEN in the last 168 hours.  Recent Results (from the past 240 hour(s))  Culture, blood (single) w Reflex to ID Panel     Status: None (Preliminary result)   Collection Time: 06/06/20  6:36 AM   Specimen: BLOOD RIGHT HAND  Result Value Ref Range Status   Specimen Description BLOOD RIGHT HAND  Final   Special Requests   Final    BOTTLES DRAWN AEROBIC AND ANAEROBIC Blood Culture results may not be optimal due to an excessive volume of blood received in culture bottles   Culture   Final    NO GROWTH 4 DAYS Performed at [facility], [address], [place], [place] [address]    Report Status PENDING  Incomplete         Radiology Studies: No results found.      Scheduled Meds: . aspirin EC  81 mg Oral Daily  . atorvastatin  80 mg Oral QPM  . cephALEXin  500 mg Oral Q6H  . citalopram  20 mg Oral Daily  . diclofenac Sodium  4 g Topical QID  . famotidine  20 mg Oral BID  . gabapentin  400 mg Oral TID  . lidocaine  1 patch Transdermal Q24H  . lisinopril  5 mg Oral Daily  . magnesium oxide  400 mg Oral Daily  . nicotine  21 mg Transdermal Daily  . pantoprazole  40 mg Oral Daily  . QUEtiapine  100 mg Oral QHS   Continuous Infusions:    LOS: 4 days    Time spent: 30 mins    [name], MD [facility] Pager [phone]  If 7PM-7AM, please contact night-coverage 06/10/2020, 7:27 AM

## 2020-06-10 NOTE — Progress Notes (Signed)
MD notified of critical WBC result.

## 2020-06-10 NOTE — Plan of Care (Signed)
Pt alert and oriented, on room air, afebrile. Pt NPO at midnight for possible LP this AM. Pt refused to have a PIV until he is sure that the LP will hold. Per pt" I will leave AMA if I dont get the procedure in the AM. Falls precautions remained in place, call bell within reach. Problem: Coping: Goal: Will verbalize positive feelings about self Outcome: Progressing   Problem: Self-Care: Goal: Ability to participate in self-care as condition permits will improve Outcome: Progressing   Problem: Education: Goal: Knowledge of General Education information will improve Description: Including pain rating scale, medication(s)/side effects and non-pharmacologic comfort measures Outcome: Progressing   Problem: Health Behavior/Discharge Planning: Goal: Ability to manage health-related needs will improve Outcome: Progressing

## 2020-06-10 NOTE — Progress Notes (Signed)
CSF WBC 8 and 13 RBC 13 and 0 Glucose 58 Protein 38. Not inflammatory looking CSF. Can follow with outpatient once the other tests are resulted. Please call neurology with questions.  -- Amie Portland, MD Neurologist [facility] Pager: [phone]

## 2020-06-10 NOTE — Progress Notes (Signed)
Pt stable after lp. Back stable.Post procedure instructions given to pt.Thankyou.

## 2020-06-11 LAB — BASIC METABOLIC PANEL
Anion gap: 9 (ref 5–15)
BUN: 16 mg/dL (ref 6–20)
CO2: 25 mmol/L (ref 22–32)
Calcium: 9.3 mg/dL (ref 8.9–10.3)
Chloride: 104 mmol/L (ref 98–111)
Creatinine, Ser: 0.95 mg/dL (ref 0.61–1.24)
GFR, Estimated: >60 mL/min (ref >60)
Glucose, Bld: 176 mg/dL — ABNORMAL HIGH (ref 70–99)
Potassium: 4.1 mmol/L (ref 3.5–5.1)
Sodium: 138 mmol/L (ref 135–145)

## 2020-06-11 LAB — CULTURE, BLOOD (SINGLE): Culture: NO GROWTH

## 2020-06-11 LAB — IGG CSF INDEX
Albumin CSF-mCnc: 23 mg/dL (ref 15–55)
Albumin: 4 g/dL (ref 3.8–4.9)
CSF IgG Index: 0.6 (ref 0.0–0.7)
IgG (Immunoglobin G), Serum: 1141 mg/dL (ref 603–1613)
IgG, CSF: 3.7 mg/dL (ref 0.0–10.3)
IgG/Alb Ratio, CSF: 0.16 (ref 0.00–0.25)

## 2020-06-11 LAB — CBC
HCT: 41.5 % (ref 39.0–52.0)
Hemoglobin: 13.4 g/dL (ref 13.0–17.0)
MCH: 27.3 pg (ref 26.0–34.0)
MCHC: 32.3 g/dL (ref 30.0–36.0)
MCV: 84.7 fL (ref 80.0–100.0)
Platelets: 359 10*3/uL (ref 150–400)
RBC: 4.9 MIL/uL (ref 4.22–5.81)
RDW: 15.2 % (ref 11.5–15.5)
WBC: 8 10*3/uL (ref 4.0–10.5)
nRBC: 0 % (ref 0.0–0.2)

## 2020-06-11 LAB — B. BURGDORFI ANTIBODIES: B burgdorferi Ab IgG+IgM: <0.91 {ISR} (ref 0.00–0.90)

## 2020-06-11 MED ORDER — QUETIAPINE FUMARATE 100 MG PO TABS: 100.0000 mg | ORAL_TABLET | Freq: Every day | ORAL | 0 refills | Status: DC

## 2020-06-11 MED ORDER — HYDRALAZINE HCL 50 MG PO TABS
50.0000 mg | ORAL_TABLET | Freq: Four times a day (QID) | ORAL | Status: DC | PRN
  Administered 2020-06-11: 50 mg via ORAL
  Filled 2020-06-11: qty 1

## 2020-06-11 MED ORDER — CEPHALEXIN 500 MG PO CAPS: 500.0000 mg | ORAL_CAPSULE | Freq: Four times a day (QID) | ORAL | 0 refills | Status: AC

## 2020-06-11 MED ORDER — OXYCODONE HCL 10 MG PO TABS: 10.0000 mg | ORAL_TABLET | Freq: Four times a day (QID) | ORAL | 0 refills | Status: AC | PRN

## 2020-06-11 MED ORDER — CITALOPRAM HYDROBROMIDE 20 MG PO TABS: 20.0000 mg | ORAL_TABLET | Freq: Every day | ORAL | 0 refills | Status: DC

## 2020-06-11 MED ORDER — HEPARIN SODIUM (PORCINE) 10000 UNIT/ML IJ SOLN
INTRAMUSCULAR | Status: AC
  Filled 2020-06-11: qty 1

## 2020-06-11 MED ORDER — LISINOPRIL 20 MG PO TABS
20.0000 mg | ORAL_TABLET | Freq: Every day | ORAL | Status: DC
  Administered 2020-06-11: 20 mg via ORAL
  Filled 2020-06-11: qty 1

## 2020-06-11 MED ORDER — LISINOPRIL 5 MG PO TABS: 5.0000 mg | ORAL_TABLET | Freq: Every day | ORAL | 0 refills | Status: DC

## 2020-06-11 NOTE — Discharge Summary (Signed)
Physician Discharge Summary  Samuel Molina ZCH:[id number] DOB: 1961-01-07 DOA: 06/05/2020  PCP: Patient, No Pcp Per  Admit date: 06/05/2020 Discharge date: 06/11/2020  Admitted From: home Disposition:  home  Recommendations for Outpatient Follow-up:  1. Follow up with PCP in 1-2 weeks 2. F/u w/ neurology in 5 days  Home Health: no Equipment/Devices:  Discharge Condition: stable  CODE STATUS: Diet recommendation: Heart Healthy / Carb Modified  Brief/Interim Summary: HPI was taken from Dr. Blaine Hamper: Samuel Molina is a 60 y.o. male with medical history significant of stroke with left-sided weakness, hypertension, hyperlipidemia, GERD, depression, Bell's palsy with left facial droop, cocaine abuse, tobacco abuse, who presents with worsening left-sided weakness.  Patient states that he has history of stroke with left-sided weakness and history of Bell's palsy with left facial droop. He states that he developed worsening left-sided weakness at about 2:30 AM while he was walking to [employer].  No vision loss or hearing loss.  Patient had one episode of chest pain which was located in the substernal area, severe, pressure-like, nonradiating.  He states his chest pain lasted shortly and has resolved currently.  He denies shortness breath, fever or chills.  He has intermittent mild dry cough recently which has not changed.  Patient states that he has chronic intermittent nausea and vomiting for a long time which has not changed, but currently no nausea, vomiting, diarrhea or abdominal pain.  No symptoms of UTI.  Of note patient had positive Covid test on 05/26/2020.  Currently patient only has mild dry cough, no shortness breath, fever, chills.  ED Course: pt was found to have WBC 7.6, INR 1.1, PTT 27, troponin level 10, 10, UDS positive for cocaine, alcohol level less than 10, electrolytes renal function okay, negative urinalysis, temperature normal, blood pressure 143/68, heart rate  59, RR 15, oxygen saturation 97% on room air.  CT-head, MRI of brain, CTA of head and neck, and CT of cerebral perfusion are all negative for acute abnormalities. Tele neurology, Dr. Humberto Seals was consulted byb EDP and recommended admission.  I also consulted Dr. Curly Shores of our neurologist.   Hospital course from Dr. Jimmye Norman 2/13-2/18/22: Pt presented w/ left sided weakness of unknown etiology.  CT-head, MRI of brain, CTA of head and neck, and CT of cerebral perfusion are all negative for acute abnormalities. LP was done but did not look inflammatory as per neuro. There were other CSF send out tests that neuro ordered and pt can f/u outpatient w/ Dr. Manuella Ghazi (outpatient neuro) to get those results as per neuro. Also, pt was found to have likely Bell's palsy as well but this was initially dx in Dec 2021 and in most situations Bell's palsy would have resolved by now as per neuro. Also. pt was positive for cocaine on urine drug screen. Pt received illicit drug abuse cessation counseling. Pt will f/u outpatient w/ neurology to f/u other send out CSF studies and further evaluation/treatment of below neurological symptoms. For more information, please see previous progress/consult notes.    Discharge Diagnoses:  Principal Problem:   Left-sided weakness Active Problems:   Gastroesophageal reflux disease with esophagitis   Cocaine abuse (HCC)   TIA (transient ischemic attack)   Stroke (HCC)   Depression   HTN (hypertension)   HLD (hyperlipidemia)   Tobacco abuse   Chest pain   COVID-19 virus infection   Left-sided weakness:etiology unclear, possible secondary to cocaine abuse. S/p LP done 06/10/20 w/ a non-inflammatory looking CSF. Other CSF tests pt can f/u  outpatient w/ neurology to get the results. C/o back pain after LP. CT-head, MRI of brain, CTA of head and neck, and CT of cerebral perfusion are all negative for acute abnormalities. Continue on aspirin, statin. PT recs outpatient therapy.  Pt refused heart healthy/carb modified diet and requested regular diet. Neuro following and recs apprec   Possible Bell's palsy: continue w/ supportive. Neuro is following   COVID-19 virus infection:positive test on 05/26/2020. CXR neg. Mild dry cough. No treatment is currently indicated   Neck & shoulder pain: continue on oxycodone. Likely secondary to disc & endplate degeneration of C-spine as per CT   Skin lesions: secondary to folliculitis as per ID. Keflex x 5 days as per ID   Chest pain: resolved. Likely secondary to cocaine abuse. Troponins neg   Cocaine abuse: illicit drug abuse cessation counseling   GERD: continue on PPI and H2 blockers   Hx of TIA & CVA: continue on aspirin, statin    Depression: severity unknown. Continue on home dose of celexa   HTN: continue on home dose of lisinopril. IV hydralazine prn. Pt refused heart healthy/carb modified diet and requested regular diet  HLD: continue on statin   Tobacco abuse: nicotine patch to prevent w/drawal. Smoking cessation counseling   Discharge Instructions  Discharge Instructions    Diet - low sodium heart healthy   Complete by: As directed    Discharge instructions   Complete by: As directed    F/u PCP in 1-2 weeks. F/u neurology, Dr. Sharlynn Oliphant, in 5 days   Increase activity slowly   Complete by: As directed      Allergies as of 06/11/2020      Reactions   Acetaminophen Itching, Nausea And Vomiting, Rash      Medication List    STOP taking these medications   oxycodone 5 MG capsule Commonly known as: OXY-IR Replaced by: Oxycodone HCl 10 MG Tabs     TAKE these medications   aspirin EC 81 MG tablet Take 81 mg by mouth daily. Swallow whole.   atorvastatin 80 MG tablet Commonly known as: LIPITOR Take 80 mg by mouth every evening.   cephALEXin 500 MG capsule Commonly known as: KEFLEX Take 1 capsule (500 mg total) by mouth every 6 (six) hours for 3 days.   citalopram 20 MG tablet Commonly  known as: CeleXA Take 1 tablet (20 mg total) by mouth daily. What changed:   medication strength  how much to take   famotidine 20 MG tablet Commonly known as: PEPCID Take 20 mg by mouth 2 (two) times daily.   gabapentin 400 MG capsule Commonly known as: NEURONTIN Take 400 mg by mouth 3 (three) times daily.   lidocaine 5 % Commonly known as: LIDODERM Place 1 patch onto the skin daily. Remove & Discard patch within 12 hours or as directed by MD   lisinopril 5 MG tablet Commonly known as: ZESTRIL Take 1 tablet (5 mg total) by mouth daily.   magnesium oxide 400 MG tablet Commonly known as: MAG-OX Take 400 mg by mouth daily.   Oxycodone HCl 10 MG Tabs Take 1 tablet (10 mg total) by mouth every 6 (six) hours as needed for up to 3 days for moderate pain or severe pain. Replaces: oxycodone 5 MG capsule   pantoprazole 40 MG tablet Commonly known as: Protonix Take 1 tablet (40 mg total) by mouth daily.   QUEtiapine 100 MG tablet Commonly known as: SEROQUEL Take 1 tablet (100 mg total) by mouth at  bedtime for 14 days. What changed:   medication strength  how much to take  when to take this  Another medication with the same name was removed. Continue taking this medication, and follow the directions you see here.   Riboflavin 100 MG Caps Take by mouth.   tiZANidine 2 MG tablet Commonly known as: ZANAFLEX Take 2 mg by mouth every 8 (eight) hours as needed for muscle spasms.   Voltaren 1 % Gel Generic drug: diclofenac Sodium Apply 4 g topically 4 (four) times daily.       Follow-up Information    Vladimir Crofts, MD In 5 days.   Specialty: Neurology Why: patient needs to call PCP for a referral Contact information: [facility] [place] [address] [phone]              Allergies  Allergen Reactions  . Acetaminophen Itching, Nausea And Vomiting and Rash     Consultations:  Neuro  ID   Procedures/Studies: CT Angio Head W or Wo Contrast  Result Date: 06/05/2020 CLINICAL DATA:  60 year old male code stroke presentation. Left side weakness. EXAM: CT ANGIOGRAPHY HEAD AND NECK CT PERFUSION BRAIN TECHNIQUE: Multidetector CT imaging of the head and neck was performed using the standard protocol during bolus administration of intravenous contrast. Multiplanar CT image reconstructions and MIPs were obtained to evaluate the vascular anatomy. Carotid stenosis measurements (when applicable) are obtained utilizing NASCET criteria, using the distal internal carotid diameter as the denominator. Multiphase CT imaging of the brain was performed following IV bolus contrast injection. Subsequent parametric perfusion maps were calculated using RAPID software. CONTRAST:  150mL OMNIPAQUE IOHEXOL 350 MG/ML SOLN COMPARISON:  Head CT 0413 hours, brain MRI 0528 hours today. FINDINGS: CT Brain Perfusion Findings: ASPECTS: 10 CBF (<30%) Volume: None Perfusion (Tmax>6.0s) volume: None Mismatch Volume: Not applicable Infarction Location:Not applicable CTA NECK Skeleton: Intermittent carious dentition. Lower cervical spine disc and endplate degeneration. No acute osseous abnormality identified. Upper chest: Negative. Other neck: Within normal limits. Aortic arch: 3 vessel arch configuration.  No arch atherosclerosis. Right carotid system: Negative. Left carotid system: Negative. Vertebral arteries: Normal proximal right subclavian artery and right vertebral artery origin. Right vertebral is patent and within normal limits to the skull base. Normal proximal left subclavian artery. Normal left vertebral artery origin. Left vertebral is codominant, patent and within normal limits to the skull base. CTA HEAD Posterior circulation: Distal vertebral arteries are patent to the basilar without plaque or stenosis. Normal PICA origins. Patent basilar artery with mild tortuosity. Patent SCA and  PCA origins. Posterior communicating arteries are diminutive or absent. Bilateral PCA branches are within normal limits, tortuosity greater on the left. Anterior circulation: Both ICA siphons are patent without plaque or stenosis. Patent carotid termini. Normal MCA and ACA origins. Mildly dominant left A1. Normal anterior communicating artery. Bilateral ACA branches are within normal limits, the right is mildly dominant. Left MCA M1 segment and bifurcation are patent without stenosis. Left MCA branches are within normal limits. Right MCA M1 and bifurcation are patent without stenosis. Right MCA branches are within normal limits. Venous sinuses: Patent. Anatomic variants: None significant. Review of the MIP images confirms the above findings IMPRESSION: 1. CTA Head and Neck and CT Perfusion are negative. No evidence of ischemia.  No arterial abnormality identified. 2. Intermittent dental caries. Lower cervical spine disc and endplate degeneration. Electronically Signed   By: Genevie Ann M.D.   On: 06/05/2020 06:11   CT Angio Neck W  and/or Wo Contrast  Result Date: 06/05/2020 CLINICAL DATA:  60 year old male code stroke presentation. Left side weakness. EXAM: CT ANGIOGRAPHY HEAD AND NECK CT PERFUSION BRAIN TECHNIQUE: Multidetector CT imaging of the head and neck was performed using the standard protocol during bolus administration of intravenous contrast. Multiplanar CT image reconstructions and MIPs were obtained to evaluate the vascular anatomy. Carotid stenosis measurements (when applicable) are obtained utilizing NASCET criteria, using the distal internal carotid diameter as the denominator. Multiphase CT imaging of the brain was performed following IV bolus contrast injection. Subsequent parametric perfusion maps were calculated using RAPID software. CONTRAST:  175mL OMNIPAQUE IOHEXOL 350 MG/ML SOLN COMPARISON:  Head CT 0413 hours, brain MRI 0528 hours today. FINDINGS: CT Brain Perfusion Findings: ASPECTS: 10 CBF  (<30%) Volume: None Perfusion (Tmax>6.0s) volume: None Mismatch Volume: Not applicable Infarction Location:Not applicable CTA NECK Skeleton: Intermittent carious dentition. Lower cervical spine disc and endplate degeneration. No acute osseous abnormality identified. Upper chest: Negative. Other neck: Within normal limits. Aortic arch: 3 vessel arch configuration.  No arch atherosclerosis. Right carotid system: Negative. Left carotid system: Negative. Vertebral arteries: Normal proximal right subclavian artery and right vertebral artery origin. Right vertebral is patent and within normal limits to the skull base. Normal proximal left subclavian artery. Normal left vertebral artery origin. Left vertebral is codominant, patent and within normal limits to the skull base. CTA HEAD Posterior circulation: Distal vertebral arteries are patent to the basilar without plaque or stenosis. Normal PICA origins. Patent basilar artery with mild tortuosity. Patent SCA and PCA origins. Posterior communicating arteries are diminutive or absent. Bilateral PCA branches are within normal limits, tortuosity greater on the left. Anterior circulation: Both ICA siphons are patent without plaque or stenosis. Patent carotid termini. Normal MCA and ACA origins. Mildly dominant left A1. Normal anterior communicating artery. Bilateral ACA branches are within normal limits, the right is mildly dominant. Left MCA M1 segment and bifurcation are patent without stenosis. Left MCA branches are within normal limits. Right MCA M1 and bifurcation are patent without stenosis. Right MCA branches are within normal limits. Venous sinuses: Patent. Anatomic variants: None significant. Review of the MIP images confirms the above findings IMPRESSION: 1. CTA Head and Neck and CT Perfusion are negative. No evidence of ischemia.  No arterial abnormality identified. 2. Intermittent dental caries. Lower cervical spine disc and endplate degeneration. Electronically  Signed   By: Genevie Ann M.D.   On: 06/05/2020 06:11   MR BRAIN WO CONTRAST  Result Date: 06/05/2020 CLINICAL DATA:  60 year old male code stroke presentation. Sudden onset left side weakness. EXAM: MRI HEAD WITHOUT CONTRAST TECHNIQUE: Multiplanar, multiecho pulse sequences of the brain and surrounding structures were obtained without intravenous contrast. COMPARISON:  Brain MRI 04/01/2020.  Head CT 0413 hours. FINDINGS: Brain: No restricted diffusion to suggest acute infarction. No midline shift, mass effect, evidence of mass lesion, ventriculomegaly, extra-axial collection or acute intracranial hemorrhage. Cervicomedullary junction and pituitary are within normal limits. Cerebral volume remains normal. [place] C[place] and white matter signal is stable and normal. No encephalomalacia or chronic cerebral blood products identified. There is a small lipoma of the left quadrigeminal plate cistern (series 16, image 58 and series 13, image 21, with confirmed macroscopic fat on earlier CT. Vascular: Major intracranial vascular flow voids are stable. Skull and upper cervical spine: Stable, negative. Sinuses/Orbits: Stable, negative; minor mucosal thickening. Other: Mastoids remain well aerated. Grossly normal visible internal auditory structures. Small benign right nasopharyngeal retention cysts. Visible scalp and face appear negative. IMPRESSION: Stable and  normal noncontrast MRI appearance of the brain. No acute intracranial abnormality. 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 Electronically Signed   By: Genevie Ann M.D.   On: 06/05/2020 05:55   MR BRAIN W CONTRAST  Result Date: 06/05/2020 CLINICAL DATA:  Headache, chronic, no new features. Additional history provided: Left hemibody weakness and facial droop of unclear etiology, possible left-sided Bell's palsy. EXAM: MRI HEAD WITH CONTRAST TECHNIQUE: Multiplanar, multiecho pulse sequences of the brain and surrounding structures were  obtained with intravenous contrast. CONTRAST:  10 mL Gadavist intravenous contrast. COMPARISON:  Noncontrast head CT and CT angiogram head/neck as well as CT perfusion 06/05/2020. FINDINGS: Postcontrast T1 weighted axial and coronal sequences were performed as a follow-up to the noncontrast brain MRI performed earlier today 06/05/2020. A precontrast T1 weighted sequence was also acquired. There is asymmetrically prominent enhancement along the course of the left facial nerve, most notably within the left ICA fundus and of the geniculate ganglion (series 2, image 46). No abnormal intracranial enhancement is identified elsewhere. IMPRESSION: Asymmetrically prominent enhancement along the course of the left facial nerve, most notably within the left IAC fundus and at the level of the geniculate ganglion. Findings likely reflect sequela of Bell's palsy. However, close clinical follow-up is recommended (with follow-up imaging as warranted) to exclude alternative etiologies such as perineural tumor spread. No other sites no other sites of abnormal intracranial enhancement are identified. Electronically Signed   By: Kellie Simmering DO   On: 06/05/2020 13:21   MR CERVICAL SPINE W WO CONTRAST  Result Date: 06/05/2020 CLINICAL DATA:  Chronic neck pain and chronic left hemibody weakness and facial. EXAM: MRI CERVICAL SPINE WITHOUT AND WITH CONTRAST TECHNIQUE: Multiplanar and multiecho pulse sequences of the cervical spine, to include the craniocervical junction and cervicothoracic junction, were obtained without and with intravenous contrast. CONTRAST:  9mL GADAVIST GADOBUTROL 1 MMOL/ML IV SOLN COMPARISON:  Radiographs 06/05/2020 FINDINGS: Very limited examination due to motion artifact. Alignment: Normal Vertebrae: Normal marrow signal. No bone lesions or fractures. No areas of abnormal contrast enhancement. Cord: Possible linear high T2 signal intensity from C2-3 to C4-5. This could be a slightly prominent central canal or  artifact. Posterior Fossa, vertebral arteries, paraspinal tissues: 5 no significant findings. Disc levels: C2-3: No significant findings. C3-4: No significant findings. C4-5: Diffuse bulging degenerated annulus and osteophytic ridging with flattening of the ventral thecal sac and suspected mild foraminal narrowing bilaterally. C5-6: Bulging annulus and osteophytic ridging with mild impression on the thecal sac and narrowing the ventral CSF space. No obvious foraminal stenosis. C6-7: Small central disc protrusion with minimal impression on the ventral thecal sac. No spinal or foraminal stenosis. C7-T1: No significant findings. IMPRESSION: 1. Very limited examination due to motion artifact. 2. Possible linear high T2 signal intensity from C2-3 to C4-5 could be a slightly prominent central canal or artifact. 3. Diffuse bulging annulus and osteophytic ridging at C4-5 with mild spinal and bilateral foraminal stenosis suspected. 4. Bulging annulus and osteophytic ridging at C5-6 with mild impression on the thecal sac but no significant spinal or foraminal stenosis. 5. Small central disc protrusion at C6-7 but no significant spinal or foraminal stenosis. Electronically Signed   By: Marijo Sanes M.D.   On: 06/05/2020 13:27   CT CEREBRAL PERFUSION W CONTRAST  Result Date: 06/05/2020 CLINICAL DATA:  60 year old male code stroke presentation. Left side weakness. EXAM: CT ANGIOGRAPHY HEAD AND NECK CT PERFUSION BRAIN TECHNIQUE: Multidetector CT imaging of the head and  neck was performed using the standard protocol during bolus administration of intravenous contrast. Multiplanar CT image reconstructions and MIPs were obtained to evaluate the vascular anatomy. Carotid stenosis measurements (when applicable) are obtained utilizing NASCET criteria, using the distal internal carotid diameter as the denominator. Multiphase CT imaging of the brain was performed following IV bolus contrast injection. Subsequent parametric perfusion  maps were calculated using RAPID software. CONTRAST:  133mL OMNIPAQUE IOHEXOL 350 MG/ML SOLN COMPARISON:  Head CT 0413 hours, brain MRI 0528 hours today. FINDINGS: CT Brain Perfusion Findings: ASPECTS: 10 CBF (<30%) Volume: None Perfusion (Tmax>6.0s) volume: None Mismatch Volume: Not applicable Infarction Location:Not applicable CTA NECK Skeleton: Intermittent carious dentition. Lower cervical spine disc and endplate degeneration. No acute osseous abnormality identified. Upper chest: Negative. Other neck: Within normal limits. Aortic arch: 3 vessel arch configuration.  No arch atherosclerosis. Right carotid system: Negative. Left carotid system: Negative. Vertebral arteries: Normal proximal right subclavian artery and right vertebral artery origin. Right vertebral is patent and within normal limits to the skull base. Normal proximal left subclavian artery. Normal left vertebral artery origin. Left vertebral is codominant, patent and within normal limits to the skull base. CTA HEAD Posterior circulation: Distal vertebral arteries are patent to the basilar without plaque or stenosis. Normal PICA origins. Patent basilar artery with mild tortuosity. Patent SCA and PCA origins. Posterior communicating arteries are diminutive or absent. Bilateral PCA branches are within normal limits, tortuosity greater on the left. Anterior circulation: Both ICA siphons are patent without plaque or stenosis. Patent carotid termini. Normal MCA and ACA origins. Mildly dominant left A1. Normal anterior communicating artery. Bilateral ACA branches are within normal limits, the right is mildly dominant. Left MCA M1 segment and bifurcation are patent without stenosis. Left MCA branches are within normal limits. Right MCA M1 and bifurcation are patent without stenosis. Right MCA branches are within normal limits. Venous sinuses: Patent. Anatomic variants: None significant. Review of the MIP images confirms the above findings IMPRESSION: 1. CTA  Head and Neck and CT Perfusion are negative. No evidence of ischemia.  No arterial abnormality identified. 2. Intermittent dental caries. Lower cervical spine disc and endplate degeneration. Electronically Signed   By: Genevie Ann M.D.   On: 06/05/2020 06:11   DG Chest Port 1 View  Result Date: 06/05/2020 CLINICAL DATA:  Chest pain EXAM: PORTABLE CHEST 1 VIEW COMPARISON:  May 05, 2020 FINDINGS: Lungs are clear. The heart size and pulmonary vascularity are normal. No adenopathy. No pneumothorax. No bone lesions. IMPRESSION: Lungs clear.  Cardiac silhouette normal. Electronically Signed   By: Lowella Grip III M.D.   On: 06/05/2020 09:03   CT HEAD CODE STROKE WO CONTRAST  Result Date: 06/05/2020 CLINICAL DATA:  Code stroke. 60 year old male with sudden onset left side weakness, inability to walk. EXAM: CT HEAD WITHOUT CONTRAST TECHNIQUE: Contiguous axial images were obtained from the base of the skull through the vertex without intravenous contrast. COMPARISON:  Brain MRI 06/03/2019.  Head CT 06/03/2019. FINDINGS: Brain: Cerebral volume remains normal. No midline shift, ventriculomegaly, mass effect, evidence of mass lesion, intracranial hemorrhage or evidence of cortically based acute infarction. Gray-white matter differentiation is within normal limits throughout the brain. Vascular: No suspicious intracranial vascular hyperdensity. Skull: No acute osseous abnormality identified. Sinuses/Orbits: Regressed ethmoid sinus mucosal thickening since December. Other Visualized paranasal sinuses and mastoids are stable and well pneumatized. Other: Stable orbit and scalp soft tissues. ASPECTS [facility] Stroke Program Early CT Score) Total score (0-10 with 10 being normal): 10 IMPRESSION: Stable  and normal noncontrast CT appearance of the brain. ASPECTS 10. Electronically Signed   By: Genevie Ann M.D.   On: 06/05/2020 04:26   DG FLUORO GUIDED LOC OF NEEDLE/CATH TIP FOR SPINAL INJECT LT  Result Date:  06/10/2020 CLINICAL DATA:  Recent stroke. EXAM: DIAGNOSTIC LUMBAR PUNCTURE UNDER FLUOROSCOPIC GUIDANCE FLUOROSCOPY TIME:  Fluoroscopy Time:  1 minutes 0 seconds Radiation Exposure Index (if provided by the fluoroscopic device): 58.2 mGy Number of Acquired Spot Images: 1 PROCEDURE: After discussing the risks and benefits of this procedure with the patient including the risk of allergy, infection, bleeding, CSF leak, informed consent obtained. Back was sterilely prepped and draped. Following local anesthesia with 1% lidocaine a 22 gauge needle was advanced into the spinal canal at the L3-L4 level. Approximately 12 cc of clear CSF obtained. CSF sent to the lab. Hemostasis achieved following the procedure. Upon recheck of the patient prior to discharge the patient was stable. Back was stable. Postprocedural instructions given to the patient. IMPRESSION: Successful fluoroscopically directed lumbar puncture. Electronically Signed   By: Marcello Moores  Register   On: 06/10/2020 09:00     Subjective: Pt c/o fatigue    Discharge Exam: Vitals:   06/11/20 0747 06/11/20 0748  BP: (!) 155/99 (!) 156/94  Pulse: 70 66  Resp: 18   Temp: (!) 97.5 F (36.4 C)   SpO2: 99%    Vitals:   06/10/20 2335 06/11/20 0422 06/11/20 0747 06/11/20 0748  BP: 133/67 (!) 183/91 (!) 155/99 (!) 156/94  Pulse: 66 62 70 66  Resp: 16 17 18    Temp: 97.8 F (36.6 C) 97.7 F (36.5 C) (!) 97.5 F (36.4 C)   TempSrc:      SpO2: 100% 99% 99%   Weight:        General: Pt is alert, awake, not in acute distress. Obese Cardiovascular: S1/S2 +, no rubs, no gallops Respiratory: CTA bilaterally, no wheezing, no rhonchi Abdominal: Soft, NT, obese, bowel sounds + Extremities:  no cyanosis    The results of significant diagnostics from this hospitalization (including imaging, microbiology, ancillary and laboratory) are listed below for reference.     Microbiology: Recent Results (from the past 240 hour(s))  Culture, blood (single) w  Reflex to ID Panel     Status: None   Collection Time: 06/06/20  6:36 AM   Specimen: BLOOD RIGHT HAND  Result Value Ref Range Status   Specimen Description BLOOD RIGHT HAND  Final   Special Requests   Final    BOTTLES DRAWN AEROBIC AND ANAEROBIC Blood Culture results may not be optimal due to an excessive volume of blood received in culture bottles   Culture   Final    NO GROWTH 5 DAYS Performed at [facility], [address] [place], [place] [address]    Report Status 06/11/2020 FINAL  Final     Labs: BNP (last 3 results) No results for input(s): BNP in the last 8760 hours. Basic Metabolic Panel: Recent Labs  Lab 06/07/20 0929 06/08/20 0606 06/09/20 0407 06/10/20 0554 06/11/20 0422  NA 141 141 141 140 138  K 3.9 3.6 4.5 4.1 4.1  CL 105 107 106 102 104  CO2 26 28 28 29 25   GLUCOSE 82 103* 91 88 176*  BUN 12 14 16 15 16   CREATININE 0.74 0.73 0.87 0.74 0.95  CALCIUM 8.8* 8.8* 9.2 9.1 9.3   Liver Function Tests: Recent Labs  Lab 06/05/20 0426  AST 28  ALT 24  ALKPHOS 94  BILITOT 0.8  PROT 8.4*  ALBUMIN 4.3   No results for input(s): LIPASE, AMYLASE in the last 168 hours. No results for input(s): AMMONIA in the last 168 hours. CBC: Recent Labs  Lab 06/05/20 0426 06/06/20 0633 06/07/20 0929 06/08/20 0606 06/09/20 0407 06/10/20 0554 06/11/20 0422  WBC 7.6   < > 7.6 6.5 8.3 7.2 8.0  NEUTROABS 4.7  --   --   --   --   --   --   HGB 13.3   < > 13.0 13.0 13.4 13.3 13.4  HCT 42.0   < > 42.1 40.8 41.2 40.9 41.5  MCV 81.1   < > 82.7 82.9 84.3 84.5 84.7  PLT 317   < > 300 318 336 340 359   < > = values in this interval not displayed.   Cardiac Enzymes: Recent Labs  Lab 06/05/20 1144  CKTOTAL 412*   BNP: Invalid input(s): POCBNP CBG: No results for input(s): GLUCAP in the last 168 hours. D-Dimer No results for input(s): DDIMER in the last 72 hours. Hgb A1c No results for input(s): HGBA1C in the last 72 hours. Lipid Profile No results for  input(s): CHOL, HDL, LDLCALC, TRIG, CHOLHDL, LDLDIRECT in the last 72 hours. Thyroid function studies No results for input(s): TSH, T4TOTAL, T3FREE, THYROIDAB in the last 72 hours.  Invalid input(s): FREET3 Anemia work up No results for input(s): VITAMINB12, FOLATE, FERRITIN, TIBC, IRON, RETICCTPCT in the last 72 hours. Urinalysis    Component Value Date/Time   COLORURINE YELLOW (A) 06/05/2020 0619   APPEARANCEUR HAZY (A) 06/05/2020 0619   LABSPEC >1.046 (H) 06/05/2020 0619   PHURINE 5.0 06/05/2020 0619   GLUCOSEU NEGATIVE 06/05/2020 0619   HGBUR SMALL (A) 06/05/2020 0619   BILIRUBINUR NEGATIVE 06/05/2020 0619   KETONESUR 20 (A) 06/05/2020 0619   PROTEINUR NEGATIVE 06/05/2020 0619   NITRITE NEGATIVE 06/05/2020 0619   LEUKOCYTESUR NEGATIVE 06/05/2020 9983   Sepsis Labs Invalid input(s): PROCALCITONIN,  WBC,  LACTICIDVEN Microbiology Recent Results (from the past 240 hour(s))  Culture, blood (single) w Reflex to ID Panel     Status: None   Collection Time: 06/06/20  6:36 AM   Specimen: BLOOD RIGHT HAND  Result Value Ref Range Status   Specimen Description BLOOD RIGHT HAND  Final   Special Requests   Final    BOTTLES DRAWN AEROBIC AND ANAEROBIC Blood Culture results may not be optimal due to an excessive volume of blood received in culture bottles   Culture   Final    NO GROWTH 5 DAYS Performed at [facility], [address] [place], [place] [address]    Report Status 06/11/2020 FINAL  Final     Time coordinating discharge: Over 30 minutes  SIGNED:   [name], MD  [facility] 06/11/2020, 12:20 PM Pager   If 7PM-7AM, please contact night-coverage

## 2020-06-11 NOTE — TOC Transition Note (Signed)
Transition of Care [facility]) - CM/SW Discharge Note   Patient Details  Name: Samuel Molina MRN: [id number] Date of Birth: 02/08/1961  [facility] CM/SW Contact:  Shelbie Hutching, RN Phone Number: 06/11/2020, 10:55 AM   Clinical Narrative:    Information for [facility] placed with discharge paperwork.  Patient will need to follow up on his own and get set up with a PCP.  Unable to set patient up for OP PT with no PCP.    Final next level of care: Home/Self Care Barriers to Discharge: No Barriers Identified   Patient Goals and CMS Choice Patient states their goals for this hospitalization and ongoing recovery are:: Patient reported that he will be moving to [facility] after discharge      Discharge Placement                       Discharge Plan and Services   Discharge Planning Services: CM Consult            DME Arranged: N/A         HH Arranged: NA          Social Determinants of Health (SDOH) Interventions     Readmission Risk Interventions Readmission Risk Prevention Plan 06/08/2020  Transportation Screening Complete  Medication Review Press photographer) Complete  PCP or Specialist appointment within 3-5 days of discharge Not Complete  PCP/Specialist Appt Not Complete comments patient moving to [facility] or [place] Not Complete  HRI or Home Care Consult Pt Refusal Comments NA  SW Recovery Care/Counseling Consult Complete  Palliative Care Screening Not [place] Not Applicable  Some recent data might be hidden

## 2020-06-11 NOTE — Plan of Care (Signed)
Pt Aox4, independent within room. C/o pain in left neck and lower back addressed with PRN oxycodone, lidocaine patch, and voltaren gel. Fall/safety precautions in place, rounding performed.  Problem: Education: Goal: Knowledge of disease or condition will improve Outcome: Progressing Goal: Knowledge of secondary prevention will improve Outcome: Progressing Goal: Knowledge of patient specific risk factors addressed and post discharge goals established will improve Outcome: Progressing   Problem: Coping: Goal: Will verbalize positive feelings about self Outcome: Progressing Goal: Will identify appropriate support needs Outcome: Progressing   Problem: Health Behavior/Discharge Planning: Goal: Ability to manage health-related needs will improve Outcome: Progressing   Problem: Self-Care: Goal: Ability to participate in self-care as condition permits will improve Outcome: Progressing   Problem: Ischemic Stroke/TIA Tissue Perfusion: Goal: Complications of ischemic stroke/TIA will be minimized Outcome: Progressing   Problem: Education: Goal: Knowledge of General Education information will improve Description: Including pain rating scale, medication(s)/side effects and non-pharmacologic comfort measures Outcome: Progressing   Problem: Health Behavior/Discharge Planning: Goal: Ability to manage health-related needs will improve Outcome: Progressing   Problem: Clinical Measurements: Goal: Ability to maintain clinical measurements within normal limits will improve Outcome: Progressing Goal: Will remain free from infection Outcome: Progressing Goal: Diagnostic test results will improve Outcome: Progressing Goal: Respiratory complications will improve Outcome: Progressing Goal: Cardiovascular complication will be avoided Outcome: Progressing   Problem: Activity: Goal: Risk for activity intolerance will decrease Outcome: Progressing   Problem: Nutrition: Goal: Adequate  nutrition will be maintained Outcome: Progressing   Problem: Coping: Goal: Level of anxiety will decrease Outcome: Progressing   Problem: Elimination: Goal: Will not experience complications related to bowel motility Outcome: Progressing Goal: Will not experience complications related to urinary retention Outcome: Progressing   Problem: Pain Managment: Goal: General experience of comfort will improve Outcome: Progressing   Problem: Safety: Goal: Ability to remain free from injury will improve Outcome: Progressing   Problem: Skin Integrity: Goal: Risk for impaired skin integrity will decrease Outcome: Progressing

## 2020-06-12 LAB — VARICELLA-ZOSTER BY PCR: Varicella-Zoster, PCR: NEGATIVE

## 2020-06-13 LAB — LYME DISEASE DNA BY PCR(BORRELIA BURG): Lyme Disease(B.burgdorferi)PCR: NEGATIVE

## 2020-06-14 LAB — EPSTEIN BARR VRS(EBV DNA BY PCR): EBV DNA QN by PCR: NEGATIVE IU/mL

## 2020-06-14 LAB — OLIGOCLONAL BANDS, CSF + SERM

## 2020-06-15 LAB — HSV DNA BY PCR (REFERENCE LAB)
HSV 1 DNA: NEGATIVE
HSV 2 DNA: NEGATIVE

## 2020-06-16 LAB — MISC LABCORP TEST (SEND OUT): Labcorp test code: 138289

## 2020-07-24 ENCOUNTER — Other Ambulatory Visit: Payer: Self-pay

## 2020-07-24 ENCOUNTER — Emergency Department
Admission: EM | Admit: 2020-07-24 | Discharge: 2020-07-28 | Disposition: A | Discharge status: Home / Self Care | Payer: Medicaid Other | Attending: Emergency Medicine | Admitting: Emergency Medicine

## 2020-07-24 DIAGNOSIS — Z7982 Long term (current) use of aspirin: Secondary | ICD-10-CM | POA: Insufficient documentation

## 2020-07-24 DIAGNOSIS — F1410 Cocaine abuse, uncomplicated: Secondary | ICD-10-CM | POA: Diagnosis present

## 2020-07-24 DIAGNOSIS — F332 Major depressive disorder, recurrent severe without psychotic features: Secondary | ICD-10-CM | POA: Diagnosis present

## 2020-07-24 DIAGNOSIS — T50902A Poisoning by unspecified drugs, medicaments and biological substances, intentional self-harm, initial encounter: Secondary | ICD-10-CM

## 2020-07-24 DIAGNOSIS — Z20822 Contact with and (suspected) exposure to covid-19: Secondary | ICD-10-CM | POA: Insufficient documentation

## 2020-07-24 DIAGNOSIS — I10 Essential (primary) hypertension: Secondary | ICD-10-CM | POA: Insufficient documentation

## 2020-07-24 DIAGNOSIS — F17210 Nicotine dependence, cigarettes, uncomplicated: Secondary | ICD-10-CM | POA: Insufficient documentation

## 2020-07-24 DIAGNOSIS — T426X2A Poisoning by other antiepileptic and sedative-hypnotic drugs, intentional self-harm, initial encounter: Secondary | ICD-10-CM | POA: Insufficient documentation

## 2020-07-24 DIAGNOSIS — Z8616 Personal history of COVID-19: Secondary | ICD-10-CM | POA: Insufficient documentation

## 2020-07-24 DIAGNOSIS — F1910 Other psychoactive substance abuse, uncomplicated: Secondary | ICD-10-CM

## 2020-07-24 DIAGNOSIS — T1491XA Suicide attempt, initial encounter: Secondary | ICD-10-CM

## 2020-07-24 DIAGNOSIS — X838XXA Intentional self-harm by other specified means, initial encounter: Secondary | ICD-10-CM | POA: Insufficient documentation

## 2020-07-24 DIAGNOSIS — F609 Personality disorder, unspecified: Secondary | ICD-10-CM | POA: Diagnosis present

## 2020-07-24 LAB — CBC
HCT: 45.1 % (ref 39.0–52.0)
Hemoglobin: 14.1 g/dL (ref 13.0–17.0)
MCH: 25.6 pg — ABNORMAL LOW (ref 26.0–34.0)
MCHC: 31.3 g/dL (ref 30.0–36.0)
MCV: 82 fL (ref 80.0–100.0)
Platelets: 336 10*3/uL (ref 150–400)
RBC: 5.5 MIL/uL (ref 4.22–5.81)
RDW: 15.2 % (ref 11.5–15.5)
WBC: 8.2 10*3/uL (ref 4.0–10.5)
nRBC: 0 % (ref 0.0–0.2)

## 2020-07-24 LAB — RESP PANEL BY RT-PCR (FLU A&B, COVID) ARPGX2
Influenza A by PCR: NEGATIVE
Influenza B by PCR: NEGATIVE
SARS Coronavirus 2 by RT PCR: NEGATIVE

## 2020-07-24 LAB — COMPREHENSIVE METABOLIC PANEL
ALT: 20 U/L (ref 0–44)
AST: 30 U/L (ref 15–41)
Albumin: 4.6 g/dL (ref 3.5–5.0)
Alkaline Phosphatase: 96 U/L (ref 38–126)
Anion gap: 12 (ref 5–15)
BUN: 20 mg/dL (ref 6–20)
CO2: 20 mmol/L — ABNORMAL LOW (ref 22–32)
Calcium: 9.6 mg/dL (ref 8.9–10.3)
Chloride: 107 mmol/L (ref 98–111)
Creatinine, Ser: 1.05 mg/dL (ref 0.61–1.24)
GFR, Estimated: >60 mL/min (ref >60)
Glucose, Bld: 145 mg/dL — ABNORMAL HIGH (ref 70–99)
Potassium: 3.7 mmol/L (ref 3.5–5.1)
Sodium: 139 mmol/L (ref 135–145)
Total Bilirubin: 1.2 mg/dL (ref 0.3–1.2)
Total Protein: 8.5 g/dL — ABNORMAL HIGH (ref 6.5–8.1)

## 2020-07-24 LAB — ETHANOL: Alcohol, Ethyl (B): <10 mg/dL

## 2020-07-24 LAB — ACETAMINOPHEN LEVEL: Acetaminophen (Tylenol), Serum: <10 ug/mL — ABNORMAL LOW (ref 10–30)

## 2020-07-24 LAB — SALICYLATE LEVEL: Salicylate Lvl: <7 mg/dL — ABNORMAL LOW (ref 7.0–30.0)

## 2020-07-24 MED ORDER — ONDANSETRON HCL 4 MG/2ML IJ SOLN
4.0000 mg | Freq: Once | INTRAMUSCULAR | Status: AC
  Administered 2020-07-24: 4 mg via INTRAVENOUS
  Filled 2020-07-24: qty 2

## 2020-07-24 MED ORDER — LACTATED RINGERS IV BOLUS
1000.0000 mL | Freq: Once | INTRAVENOUS | Status: AC
  Administered 2020-07-24: 1000 mL via INTRAVENOUS

## 2020-07-24 MED ORDER — IBUPROFEN 600 MG PO TABS
600.0000 mg | ORAL_TABLET | Freq: Once | ORAL | Status: AC
  Administered 2020-07-24: 600 mg via ORAL
  Filled 2020-07-24: qty 1

## 2020-07-24 NOTE — Progress Notes (Addendum)
Samuel Molina is a 60 y.o. male with a history of depression, anxiety, and substance abuse who presents after a suicide attempt. The patient had been verbally abusive to all staff who had attempted to speak with him. This Probation officer tried to assess him; he became "nasty" and said he was not talking to anyone and wanted [place] to get out of his room.
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
## 2020-07-24 NOTE — ED Notes (Signed)
Awaiting soc

## 2020-07-24 NOTE — ED Notes (Signed)
IVC called [facility] for consult [phone]

## 2020-07-24 NOTE — ED Notes (Signed)
Patient refusing to leave cardiac monitor on. Provider notified.

## 2020-07-24 NOTE — ED Notes (Signed)
TTS at bedside to consult patient reports unable to participate in interview process due to headache, pulled covers over head and went back to sleep.

## 2020-07-24 NOTE — ED Notes (Signed)
Hourly rounding reveals patient in room. No complaints, stable, in no acute distress. Q15 minute rounds and monitoring via Security Cameras to continue. 

## 2020-07-24 NOTE — ED Triage Notes (Addendum)
EMS brought in for intentional overdose. Pt reports taking an unknown amount of Gabapentin, Celexa, Seroquel, and Atorvastatin around midnight. Reports friends making fun of him due to deficits from previous stroke. AAOx4. NAD. Suicidal and homicidal ideations. States "Dont ask me no ignorant questions because I do have a tendency to get very violent, if I tell you I dont want something done dont try to coerce me into it or I'll get violent".   Also complaining of right sided abdominal pain that started before ingestion of medications. Headache.

## 2020-07-24 NOTE — ED Notes (Signed)
TTS at bedside. 

## 2020-07-24 NOTE — ED Notes (Signed)
Report to include Situation, Background, Assessment, and Recommendations received from [facility]. Patient alert and oriented, warm and dry, in no acute distress. Patient reports SI without a plan. Denies HI, AVH and pain. Patient made aware of Q15 minute rounds and security cameras for their safety. Patient instructed to come to me with needs or concerns.

## 2020-07-24 NOTE — BH Assessment (Signed)
TTS unable to assess at this time.  Patient could not be aroused enough to participate with the assessment.

## 2020-07-24 NOTE — ED Notes (Signed)
IVC moved to [facility] 1 0901

## 2020-07-24 NOTE — ED Notes (Signed)
IVC/Pending psych consult.

## 2020-07-24 NOTE — ED Notes (Signed)
SOC called for update on time, 2-3 more hours from 8:14pm

## 2020-07-24 NOTE — ED Notes (Signed)
Pt IV infiltrated while fluids were going. States that the fluids helped his cramps and he feels better. Pt got about 779ml of the fluids. Fluids and IV d/c.

## 2020-07-24 NOTE — ED Notes (Signed)
ED Provider at bedside. 

## 2020-07-24 NOTE — ED Notes (Signed)
Snack and beverage given. 

## 2020-07-24 NOTE — ED Notes (Signed)
Patient  Reports that he has a "Massive headache" and would not participate with the assessment.

## 2020-07-24 NOTE — BH Assessment (Signed)
This Probation officer attempted to assess pt with Lynder Parents., NP however pt refused to participate in the assessment. TTS to follow up.

## 2020-07-24 NOTE — ED Notes (Addendum)
Patient belongings removed and secured outside of room. Changed into hospital scrubs by this RN and Rossie Muskrat. Patient belongings include: Blue jeans Black belt Black and white plaid shirt [employer] Pair of socks x1 [employer]- no money  Opened 2L [employer] Cellphone Glasses [employer] Station[employer] Wipes Blue towel [employer] Work badge Cabin crew Toothbrush [employer] Black Sandals Black and blue overnight bag  Medications include Atorvastatin, Aspirin, Lisinopril, Citalopram, Pantoprazole, Neomycin ear drops secured and walked to pharmacy for storage.

## 2020-07-24 NOTE — ED Notes (Signed)
Fluids placed on pump. Pt agreed to covid swab. Will bring breakfast in. Denies further needs.

## 2020-07-25 MED ORDER — FAMOTIDINE 20 MG PO TABS
20.0000 mg | ORAL_TABLET | Freq: Two times a day (BID) | ORAL | Status: DC
  Administered 2020-07-25 – 2020-07-28 (×7): 20 mg via ORAL
  Filled 2020-07-25 (×7): qty 1

## 2020-07-25 MED ORDER — CITALOPRAM HYDROBROMIDE 20 MG PO TABS: 20.0000 mg | ORAL_TABLET | Freq: Every day | ORAL | Status: DC

## 2020-07-25 MED ORDER — LISINOPRIL 5 MG PO TABS
5.0000 mg | ORAL_TABLET | Freq: Every day | ORAL | Status: DC
  Administered 2020-07-25 – 2020-07-28 (×4): 5 mg via ORAL
  Filled 2020-07-25 (×4): qty 1

## 2020-07-25 MED ORDER — GABAPENTIN 300 MG PO CAPS
400.0000 mg | ORAL_CAPSULE | Freq: Three times a day (TID) | ORAL | Status: DC
  Administered 2020-07-25 – 2020-07-28 (×10): 400 mg via ORAL
  Filled 2020-07-25 (×10): qty 1

## 2020-07-25 MED ORDER — TIZANIDINE HCL 2 MG PO TABS
2.0000 mg | ORAL_TABLET | Freq: Three times a day (TID) | ORAL | Status: DC | PRN
  Filled 2020-07-25: qty 1

## 2020-07-25 MED ORDER — ASPIRIN EC 81 MG PO TBEC
81.0000 mg | DELAYED_RELEASE_TABLET | Freq: Every day | ORAL | Status: DC
  Administered 2020-07-25 – 2020-07-28 (×4): 81 mg via ORAL
  Filled 2020-07-25 (×4): qty 1

## 2020-07-25 MED ORDER — DICLOFENAC SODIUM 1 % EX GEL
4.0000 g | Freq: Four times a day (QID) | CUTANEOUS | Status: DC
  Administered 2020-07-25 – 2020-07-28 (×9): 4 g via TOPICAL
  Filled 2020-07-25: qty 100

## 2020-07-25 MED ORDER — QUETIAPINE FUMARATE 25 MG PO TABS
100.0000 mg | ORAL_TABLET | Freq: Every day | ORAL | Status: DC
  Administered 2020-07-25 – 2020-07-27 (×3): 100 mg via ORAL
  Filled 2020-07-25 (×3): qty 4

## 2020-07-25 MED ORDER — QUETIAPINE FUMARATE 25 MG PO TABS: 100.0000 mg | ORAL_TABLET | Freq: Every day | ORAL | Status: DC

## 2020-07-25 MED ORDER — PANTOPRAZOLE SODIUM 40 MG PO TBEC
40.0000 mg | DELAYED_RELEASE_TABLET | Freq: Every day | ORAL | Status: DC
  Administered 2020-07-25 – 2020-07-28 (×4): 40 mg via ORAL
  Filled 2020-07-25 (×4): qty 1

## 2020-07-25 MED ORDER — ATORVASTATIN CALCIUM 20 MG PO TABS
80.0000 mg | ORAL_TABLET | Freq: Every evening | ORAL | Status: DC
  Administered 2020-07-25 – 2020-07-27 (×3): 80 mg via ORAL
  Filled 2020-07-25 (×4): qty 4

## 2020-07-25 NOTE — ED Notes (Signed)
Hourly rounding reveals patient in room. No complaints, stable, in no acute distress. Q15 minute rounds and monitoring via Security Cameras to continue. 
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
## 2020-07-25 NOTE — ED Notes (Signed)
Pt IVC/pending re-eval with [name] on Monday.

## 2020-07-25 NOTE — ED Provider Notes (Signed)
Emergency Medicine Observation Re-evaluation Note  Castle Lamons is a 60 y.o. male, seen on rounds today.  Pt initially presented to the ED for complaints of Drug Overdose and Abdominal Pain Currently, the patient is resting comfortably, no acute complaints.  Physical Exam  BP 132/78 (BP Location: Right Arm)   Pulse 72   Temp 97.8 F (36.6 C) (Oral)   Resp 18   Ht 6\' 1"  (1.854 m)   Wt 117.9 kg   SpO2 99%   BMI 34.30 kg/m  Physical Exam General: no acute distress Cardiac: normal rate Lungs: equal chest rise Psych: calm  ED Course / MDM  EKG:   I have reviewed the labs performed to date as well as medications administered while in observation.  Recent changes in the last 24 hours include none.  Plan  Current plan is for psych eval by [name] 4/4. Patient is under full IVC at this time.   Lucrezia Starch, MD 07/25/20 [phone]

## 2020-07-25 NOTE — ED Notes (Signed)
Pt given dinner tray. Pt asleep, tray placed on bedside chair.

## 2020-07-25 NOTE — ED Notes (Signed)
Snack and beverage given. 

## 2020-07-25 NOTE — ED Notes (Signed)
Meal tray given 

## 2020-07-25 NOTE — ED Notes (Signed)
Pt banging on nurses station door demanding snacks.  Pt told breakfast would be here around 8.

## 2020-07-25 NOTE — ED Notes (Signed)
SOC canceled per Dr. Quentin Cornwall due to in house Psych NP.

## 2020-07-25 NOTE — ED Notes (Signed)
VS not taken, Patient asleep.

## 2020-07-25 NOTE — ED Notes (Signed)
Report to include Situation, Background, Assessment, and Recommendations received from Amy RN. Patient alert and oriented, warm and dry, in no acute distress. Patient denies SI, HI, AVH and pain. Patient made aware of Q15 minute rounds and security cameras for their safety. Patient instructed to come to me with needs or concerns.

## 2020-07-25 NOTE — ED Notes (Signed)
Pt up to bathroom.

## 2020-07-25 NOTE — ED Notes (Signed)
Pt requesting to change rooms.  Pt told this was not allowed.

## 2020-07-26 DIAGNOSIS — F332 Major depressive disorder, recurrent severe without psychotic features: Secondary | ICD-10-CM

## 2020-07-26 NOTE — ED Notes (Signed)
VS not taken, patient asleep 

## 2020-07-26 NOTE — BH Assessment (Signed)
Referral information for Psychiatric Hospitalization faxed to;   [facility] [phone]),   . [facility] ([phone]),  . [facility] [phone]),   . [name] [phone] -or- [phone]),   . [name] [phone]).  [facility] [phone])  . [place] [phone] -or- [phone])   [facility] [phone]) Per Angelena Form pt to be reviewed in the AM due to there not being an [facility] on duty tonight.

## 2020-07-26 NOTE — ED Notes (Signed)
Pt given lunchbox.

## 2020-07-26 NOTE — ED Notes (Signed)
Hourly rounding reveals patient in room. No complaints, stable, in no acute distress. Q15 minute rounds and monitoring via Security Cameras to continue. 

## 2020-07-26 NOTE — ED Notes (Signed)
IVC  SEEN  BY  DR  CLAPACS  PENDING  PLACEMENT

## 2020-07-26 NOTE — ED Notes (Signed)
Pt given breakfast tray

## 2020-07-26 NOTE — Consult Note (Signed)
[facility] Face-to-Face Psychiatry Consult   Reason for Consult: Consult for this 60 year old man well-known to the psychiatric service who presented claiming active suicidal ideation and behavior Referring Physician: Jari Pigg Patient Identification: Cedarius Kersh MRN:  [id number] Principal Diagnosis: Severe recurrent major depression without psychotic features ([place]) Diagnosis:  Principal Problem:   Severe recurrent major depression without psychotic features ([place]) Active Problems:   Cocaine abuse ([place])   Personality disorder ([place])   Total Time spent with patient: 1 hour  Subjective:   Jaime Grizzell is a 60 y.o. male patient admitted with "you do not ever listen to me".  HPI: Patient seen chart reviewed.  60 year old man pretty well known to the emergency room service who comes in claiming that he took an intentional overdose of pills belonging to another person.  He says that he continues to feel depressed all the time.  Feels hopeless.  Does not sleep well.  Angry at his family.  Angry most of the time.  When I ask him about substance abuse he evades the question getting angry at me for supposedly always focusing on that issue.  Notably has not provided a drug screen yet.  Patient is demanding that he needs "inpatient rehab".  States he absolutely needs to come into the hospital and get rehab treatment even while then claiming he is not using substances.  Past Psychiatric History: Long history of behavior problems at least in terms of multiple presentations to the hospital.  Substance abuse has always appeared to be the primary problem but patient will complain of mood anger irritability.  History of gross noncompliance with multiple attempts to refer him to appropriate facilities which he has avoided.  Risk to Self:   Risk to Others:   Prior Inpatient Therapy:   Prior Outpatient Therapy:    Past Medical History:  Past Medical History:  Diagnosis Date  . Anxiety   . Depression   .  GERD (gastroesophageal reflux disease)   . Obesity   . Stroke ([place])   . Substance abuse [facility])     Past Surgical History:  Procedure Laterality Date  . ESOPHAGOGASTRODUODENOSCOPY (EGD) WITH PROPOFOL N/A 09/20/2017   Procedure: ESOPHAGOGASTRODUODENOSCOPY (EGD) WITH PROPOFOL;  Surgeon: Lin Landsman, MD;  Location: [facility];  Service: Gastroenterology;  Laterality: N/A;  . KNEE SURGERY Right    Family History:  Family History  Problem Relation Age of Onset  . Vasculitis Mother    Family Psychiatric  History: See previous Social History:  Social History   Substance and Sexual Activity  Alcohol Use Not Currently     Social History   Substance and Sexual Activity  Drug Use Yes  . Types: Cocaine    Social History   Socioeconomic History  . Marital status: Single    Spouse name: Not on file  . Number of children: Not on file  . Years of education: Not on file  . Highest education level: Not on file  Occupational History  . Not on file  Tobacco Use  . Smoking status: Current Every Day Smoker    Packs/day: 0.50    Types: Cigarettes  . Smokeless tobacco: Never Used  Vaping Use  . Vaping Use: Never used  Substance and Sexual Activity  . Alcohol use: Not Currently  . Drug use: Yes    Types: Cocaine  . Sexual activity: Not on file  Other Topics Concern  . Not on file  Social History Narrative  . Not on file   Social Determinants of  Health   Financial Resource Strain: Not on file  Food Insecurity: Not on file  Transportation Needs: Not on file  Physical Activity: Not on file  Stress: Not on file  Social Connections: Not on file   Additional Social History:    Allergies:   Allergies  Allergen Reactions  . Acetaminophen Itching, Nausea And Vomiting and Rash    Labs: No results found for this or any previous visit (from the past 48 hour(s)).  Current Facility-Administered Medications  Medication Dose Route Frequency Provider Last Rate Last Admin   . aspirin EC tablet 81 mg  81 mg Oral Daily Lucrezia Starch, MD   81 mg at 07/26/20 1001  . atorvastatin (LIPITOR) tablet 80 mg  80 mg Oral QPM Lucrezia Starch, MD   80 mg at 07/25/20 1638  . diclofenac Sodium (VOLTAREN) 1 % topical gel 4 g  4 g Topical QID Lucrezia Starch, MD   4 g at 07/26/20 1323  . famotidine (PEPCID) tablet 20 mg  20 mg Oral BID Lucrezia Starch, MD   20 mg at 07/26/20 1002  . gabapentin (NEURONTIN) capsule 400 mg  400 mg Oral TID Lucrezia Starch, MD   400 mg at 07/26/20 1548  . lisinopril (ZESTRIL) tablet 5 mg  5 mg Oral Daily Lucrezia Starch, MD   5 mg at 07/26/20 1001  . pantoprazole (PROTONIX) EC tablet 40 mg  40 mg Oral Daily Lucrezia Starch, MD   40 mg at 07/26/20 1001  . QUEtiapine (SEROQUEL) tablet 100 mg  100 mg Oral QHS Harvest Dark, MD   100 mg at 07/25/20 2144  . tiZANidine (ZANAFLEX) tablet 2 mg  2 mg Oral Q8H PRN Lucrezia Starch, MD       Current Outpatient Medications  Medication Sig Dispense Refill  . QUEtiapine (SEROQUEL) 100 MG tablet Take 100 mg by mouth at bedtime.    Marland Kitchen aspirin EC 81 MG tablet Take 81 mg by mouth daily. Swallow whole.    Marland Kitchen atorvastatin (LIPITOR) 80 MG tablet Take 80 mg by mouth every evening.    . citalopram (CELEXA) 20 MG tablet Take 1 tablet (20 mg total) by mouth daily. 30 tablet 0  . diclofenac Sodium (VOLTAREN) 1 % GEL Apply 4 g topically 4 (four) times daily.    . famotidine (PEPCID) 20 MG tablet Take 20 mg by mouth 2 (two) times daily.    Marland Kitchen gabapentin (NEURONTIN) 400 MG capsule Take 400 mg by mouth 3 (three) times daily.    Marland Kitchen lidocaine (LIDODERM) 5 % Place 1 patch onto the skin daily. Remove & Discard patch within 12 hours or as directed by MD    . lisinopril (ZESTRIL) 5 MG tablet Take 1 tablet (5 mg total) by mouth daily. 30 tablet 0  . magnesium oxide (MAG-OX) 400 MG tablet Take 400 mg by mouth daily.    . pantoprazole (PROTONIX) 40 MG tablet Take 1 tablet (40 mg total) by mouth daily. 30 tablet 1  . QUEtiapine  (SEROQUEL) 100 MG tablet Take 1 tablet (100 mg total) by mouth at bedtime for 14 days. 14 tablet 0  . Riboflavin 100 MG CAPS Take by mouth.    Marland Kitchen tiZANidine (ZANAFLEX) 2 MG tablet Take 2 mg by mouth every 8 (eight) hours as needed for muscle spasms.      Musculoskeletal: Strength & Muscle Tone: within normal limits Gait & Station: normal Patient leans: N/A  Psychiatric Specialty Exam:  Presentation  General Appearance: Appropriate for Environment; Casual  Eye Contact:Good  Speech:Clear and Coherent; Normal Rate  Speech Volume:Normal  Handedness:Right   Mood and Affect  Mood:Euthymic  Affect:Appropriate; Congruent   Thought Process  Thought Processes:Coherent; Goal Directed  Descriptions of Associations:Intact  Orientation:Full (Time, Place and Person)  Thought Content:WDL  History of Schizophrenia/Schizoaffective disorder:No  Duration of Psychotic Symptoms:Less than six months  Hallucinations:No data recorded Ideas of Reference:None  Suicidal Thoughts:No data recorded Homicidal Thoughts:No data recorded  Sensorium  Memory:Immediate Good; Recent Good  Judgment:Intact  Insight:Present   Executive Functions  Concentration:Good  Attention Span:Good  [place] of Knowledge:Good  Language:Good   Psychomotor Activity  Psychomotor Activity:No data recorded  Assets  Assets:Communication Skills; Desire for Improvement   Sleep  Sleep:No data recorded  Physical Exam: Physical Exam Vitals and nursing note reviewed.  Constitutional:      Appearance: Normal appearance.  HENT:     Head: Normocephalic and atraumatic.     Mouth/Throat:     Pharynx: Oropharynx is clear.  Eyes:     Pupils: Pupils are equal, round, and reactive to light.  Cardiovascular:     Rate and Rhythm: Normal rate and regular rhythm.  Pulmonary:     Effort: Pulmonary effort is normal.     Breath sounds: Normal breath sounds.  Abdominal:      General: Abdomen is flat.     Palpations: Abdomen is soft.  Musculoskeletal:        General: Normal range of motion.  Skin:    General: Skin is warm and dry.  Neurological:     General: No focal deficit present.     Mental Status: He is alert. Mental status is at baseline.  Psychiatric:        Attention and Perception: He is inattentive.        Mood and Affect: Mood is depressed. Affect is labile.        Speech: Speech is tangential.        Behavior: Behavior is agitated.        Thought Content: Thought content includes suicidal ideation. Thought content includes suicidal plan.        Judgment: Judgment is inappropriate.    Review of Systems  Constitutional: Negative.   HENT: Negative.   Eyes: Negative.   Respiratory: Negative.   Cardiovascular: Negative.   Gastrointestinal: Negative.   Musculoskeletal: Negative.   Skin: Negative.   Neurological: Negative.   Psychiatric/Behavioral: Positive for depression, hallucinations and suicidal ideas. The patient is nervous/anxious.    Blood pressure (!) 126/57, pulse 75, temperature 97.6 F (36.4 C), temperature source Oral, resp. rate 18, height 6\' 1"  (1.854 m), weight 117.9 kg, SpO2 96 %. Body mass index is 34.3 kg/m.  Treatment Plan Summary: Plan Patient has a history of frank malingering and of presenting multiple times with a story of having overdosed on other peoples medicines which certainly brings his current history into question.  Nevertheless still endorsing suicidal ideation and begging for inpatient treatment.  Medicines had been restarted earlier.  He will be on the list for possible admission inpatient or in the meanwhile can be referred out to other facilities.  Reassess day by day.  Disposition: Recommend psychiatric Inpatient admission when medically cleared. Supportive therapy provided about ongoing stressors.  Alethia Berthold, MD 07/26/2020 5:54 PM

## 2020-07-26 NOTE — ED Notes (Signed)
Report to include situation, background, assessment and recommendations from [facility]. Patient sleeping, respirations regular and unlabored. Q15 minute rounds and security camera observation to continue.

## 2020-07-26 NOTE — ED Provider Notes (Signed)
Emergency Medicine Observation Re-evaluation Note  Samuel Molina is a 60 y.o. male, seen on rounds today.  Pt initially presented to the ED for complaints of Drug Overdose and Abdominal Pain Currently, the patient is laying in bed, denies any complaints.  Physical Exam  BP 132/85 (BP Location: Right Arm)   Pulse 62   Temp 98.1 F (36.7 C) (Oral)   Resp 20   Ht 6\' 1"  (1.854 m)   Wt 117.9 kg   SpO2 99%   BMI 34.30 kg/m  Physical Exam Constitutional: Resting comfortably. Eyes: Conjunctivae are normal. Head: Atraumatic. Nose: No congestion/rhinnorhea. Mouth/Throat: Mucous membranes are moist. Neck: Normal ROM Cardiovascular: No cyanosis noted. Respiratory: Normal respiratory effort. Gastrointestinal: Non-distended. Genitourinary: deferred Musculoskeletal: No lower extremity tenderness nor edema. Neurologic:  Normal speech and language. No gross focal neurologic deficits are appreciated. Skin:  Skin is warm, dry and intact. No rash noted.    ED Course / MDM  EKG:   I have reviewed the labs performed to date as well as medications administered while in observation.  Recent changes in the last 24 hours include none.  Plan  Current plan is for further psychiatric evaluation today. Patient is under full IVC at this time.   Blake Divine, MD 07/26/20 [phone]

## 2020-07-27 MED ORDER — CEPHALEXIN 500 MG PO CAPS
500.0000 mg | ORAL_CAPSULE | Freq: Two times a day (BID) | ORAL | Status: DC
  Administered 2020-07-27 – 2020-07-28 (×3): 500 mg via ORAL
  Filled 2020-07-27 (×5): qty 1

## 2020-07-27 MED ORDER — TRAMADOL HCL 50 MG PO TABS
50.0000 mg | ORAL_TABLET | Freq: Once | ORAL | Status: AC
Start: 2020-07-27 — End: 2020-07-27
  Administered 2020-07-27: 50 mg via ORAL
  Filled 2020-07-27: qty 1

## 2020-07-27 MED ORDER — CEPHALEXIN 500 MG PO CAPS: 500.0000 mg | ORAL_CAPSULE | Freq: Once | ORAL | Status: DC

## 2020-07-27 NOTE — ED Notes (Signed)
Hourly rounding reveals patient in room. No complaints, stable, in no acute distress. Q15 minute rounds and monitoring via Security Cameras to continue. 

## 2020-07-27 NOTE — ED Notes (Signed)
Dr. Corky Downs at bedside to assess patient's left thumb pain and swelling.

## 2020-07-27 NOTE — ED Notes (Signed)
C/O being "bitten by something overnight" to left thumb.  C/O left thumb swelling and pain.  Swelling noted to left thumb.  Brisk capillary refill.  Will alert. Dr. Corky Downs.

## 2020-07-27 NOTE — ED Notes (Signed)
IVC/  PENDING  PLACEMENT 

## 2020-07-27 NOTE — BH Assessment (Signed)
Referral information for Psychiatric Hospitalization faxed to;    [facility] ([phone]- [phone]),  Per Destiny, pt denied due to financial.   [facility] Hoes [phone]), No answer   [facility] [phone]), Minette Brine requested a refax. Task completed: 6:23 AM   [name] [phone] -or- [phone]), Per Izola Price pt is under review.    [name] [phone]). Left a voice mail   [facility] [phone]) Per [name], pt is denied due to [place] [phone] -or- [phone]) No answer   [facility] [phone]) Per Angelena Form pt to be reviewed in the AM due to there not being an [facility] on duty tonight.

## 2020-07-27 NOTE — BH Assessment (Signed)
Referral Check:   Samuel Molina ([phone]-TY- [phone]),  Per Angelica Pou, pt denied due to [place] ([phone]) [name] request refax, task completed 12:08pm    [facility] [phone]) [name] reports under review    [name] [phone] -or- [phone]), Per [name] no beds available today, request refax for review tomorrow, task completed 12:08pm    [name] [phone]). Left 2nd [facility] [phone]) Per [name], pt is denied due to financial.   [name] [phone] -or- [phone]) [name] request refax, task completed 12:08pm    [facility] ([phone])Per night TTS, Per [name], AC pt to be reviewed in the AM due to there not being an [facility] on duty tonight.

## 2020-07-27 NOTE — ED Provider Notes (Signed)
Patient complained of mild pain to the left thumb, very minimal swelling to the pad of the left thumb, no significant redness or fluctuance.  No apparent injury.  No discharge.  Question early infection although atypical location, will start the patient on antibiotics and monitor   Lavonia Drafts, MD 07/27/20 1222

## 2020-07-27 NOTE — ED Notes (Signed)
Lunch tray given.  Patient resting.  Will continue to monitor.

## 2020-07-27 NOTE — ED Notes (Signed)
VS not taking, patient asleep.

## 2020-07-27 NOTE — ED Notes (Signed)
IVC/Pending Placement 

## 2020-07-27 NOTE — ED Notes (Signed)
Pt. Alert and oriented, warm and dry, in no distress. Pt. Denies [facility]. Patient states he is having S with any plan he can come up with. Patient does contract for safety with this Probation officer. Patient also states having HI toward [name] due to patient still being in [facility] and not getting a bed in the BMU unlike other patients.  This Probation officer explained to patient that other patients more than likely went to other area hospitals but we can not tell him where they are going due to [place]. Patient contracts for safety with the HI feelings. Pt. Encouraged to let nursing staff know of any concerns or needs.   ENVIRONMENTAL ASSESSMENT Potentially harmful objects out of patient reach: Yes.   Personal belongings secured: Yes.   Patient dressed in hospital provided attire only: Yes.   Plastic bags out of patient reach: Yes.   Patient care equipment (cords, cables, call bells, lines, and drains) shortened, removed, or accounted for: Yes.   Equipment and supplies removed from bottom of stretcher: Yes.   Potentially toxic materials out of patient reach: Yes.   Sharps container removed or out of patient reach: Yes.

## 2020-07-28 DIAGNOSIS — F332 Major depressive disorder, recurrent severe without psychotic features: Secondary | ICD-10-CM | POA: Diagnosis not present

## 2020-07-28 LAB — URINE DRUG SCREEN, QUALITATIVE (ARMC ONLY)
Amphetamines, Ur Screen: NOT DETECTED
Barbiturates, Ur Screen: NOT DETECTED
Benzodiazepine, Ur Scrn: NOT DETECTED
Cannabinoid 50 Ng, Ur ~~LOC~~: NOT DETECTED
Cocaine Metabolite,Ur ~~LOC~~: POSITIVE — AB
MDMA (Ecstasy)Ur Screen: NOT DETECTED
Methadone Scn, Ur: NOT DETECTED
Opiate, Ur Screen: NOT DETECTED
Phencyclidine (PCP) Ur S: NOT DETECTED
Tricyclic, Ur Screen: POSITIVE — AB

## 2020-07-28 LAB — POC SARS CORONAVIRUS 2 AG -  ED: SARS Coronavirus 2 Ag: NEGATIVE

## 2020-07-28 NOTE — ED Notes (Signed)
Given  breakfast

## 2020-07-28 NOTE — ED Notes (Signed)
assisted patient through cell phone to write down important phone numbers he needed, cell phone then shut off and returned to labeled belonging bag.

## 2020-07-28 NOTE — ED Notes (Addendum)
Pt requesting drink, offered water-declines.  Up to bathroom and used before urine sample obtained.

## 2020-07-28 NOTE — ED Notes (Signed)
Demanding clothing and wants to leave. With discharge once IVC paperwork rescinded.

## 2020-07-28 NOTE — ED Notes (Addendum)
Alert and oriented X4. Able to eat and drink without difficulties. ambulates safely. States he has a safe place to be discharge to. Left with all of belongings. Physical e sign placed on chart  No signs of inflicting harm on self or others. Pt cooperative today with exception of when he does not get "more juice" or "more icey" he becomes irritable and verbally condescending to staff. Pt ate all of breakfast.

## 2020-07-28 NOTE — ED Provider Notes (Signed)
Emergency Medicine Observation Re-evaluation Note  Samuel Molina is a 60 y.o. male, seen on rounds today.  Pt initially presented to the ED for complaints of Drug Overdose and Abdominal Pain  Currently, the patient is calm, no acute complaints.  Physical Exam  Blood pressure (!) 145/101, pulse 65, temperature 97.8 F (36.6 C), temperature source Oral, resp. rate 16, height 6\' 1"  (1.854 m), weight 117.9 kg, SpO2 98 %. Physical Exam General: NAD Lungs: CTAB Psych: not agitated  ED Course / MDM  EKG:    I have reviewed the labs performed to date as well as medications administered while in observation.  Recent changes in the last 24 hours include no acute events overnight.  Psychiatry [name] reassessed today, finds patient to be psychiatrically stable for discharge.  Plan  Current plan is for discharge.  Patient well-known to the ED with frequent presentations.  Case discussed with psychiatry. Patient is not under full IVC at this time.   Samuel Mew, MD 07/28/20 1144

## 2020-07-28 NOTE — ED Notes (Signed)
Rescinded by [name] 1030

## 2020-07-28 NOTE — ED Notes (Signed)
Given all of belongings on discharge. 3 of 3 bags. Pt states he will call for a ride himself.

## 2020-07-28 NOTE — ED Notes (Signed)
Updated EDP that pt is wanting his clothing and asked for discharge after IVC rescinded.

## 2020-07-28 NOTE — ED Notes (Signed)
Up to bathroom and back to room.

## 2020-07-28 NOTE — ED Notes (Signed)
Requesting drink, offered water-pt declines. Requesting ginger ale. Pt given ginger ale.

## 2020-07-28 NOTE — BH Assessment (Addendum)
Referral Check:   Samuel Molina ([phone]),Per Samuel Molina, pt denied due to Samuel Molina [phone]) Samuel Molina request refax, task completed 12:08pm, no night shift staff available   [facility] [phone]) Intake staff currently busy with patients in their lobby    [place] [phone] -or- [place] [phone]).Left 2nd voicemail   [facility] ([place], pt is denied due to financial.   [name] [phone] -or- [phone])[name] request refax, task completed 12:08pm    [facility] [phone] [place] reports no beds available

## 2020-07-28 NOTE — ED Notes (Signed)
When secretary going through patient chart, found receipt for home medications stored by pharmacy. RN called pt to let him know that he left them, no answer-left message to return phone call.

## 2020-07-28 NOTE — ED Notes (Signed)
Unable to obtain vitals due to patient sleeping. Will continue to monitor.   

## 2020-07-28 NOTE — Consult Note (Signed)
[facility]iatry Consult   Reason for Consult: Follow-up for this 60 year old man with a history of depression and substance abuse and personality symptoms who has been in the emergency room for several days Referring Physician: Joni Fears Patient Identification: Samuel Molina MRN:  [id number] Principal Diagnosis: Severe recurrent major depression without psychotic features ([place]) Diagnosis:  Principal Problem:   Severe recurrent major depression without psychotic features ([place]) Active Problems:   Cocaine abuse ([place])   Personality disorder ([place])   Total Time spent with patient: 30 minutes  Subjective:   Samuel Molina is a 60 y.o. male patient admitted with "do not even talk to me".  HPI: Patient seen for follow-up.  Patient has been in the emergency room for several days after initially presenting with mood complaints.  Patient had demanded that he be admitted to the psychiatry service.  Since being in the emergency room he has not displayed any signs of psychosis.  His affect has been typical for him with irritability when he does not get what he wants but at other times pleasant and upbeat.  Has not done anything to try and harm himself.  Eating robustly.  Patient is not having any signs or symptoms of withdrawal.  Based on his current presentation and on his long history of similar presentations with often exaggerated symptoms as well as his lack of benefit from treatment because of noncompliance he appears now to be at his baseline.  Past Psychiatric History: Patient has a history well-known to [place] in the emergency room and on the psychiatric service.  Has had frequent presentations usually with similar complaints.  Often greatly exaggerating physical distress.  He has frequently stated that he had tried to kill himself by overdosing on somebody else's pills.  We have not found any clear physiologic evidence of this.  Patient has an ongoing problem with cocaine which he  minimizes.  Has not followed up with any appropriate outpatient treatment or cooperated with referrals to rehabilitation  Risk to Self:   Risk to Others:   Prior Inpatient Therapy:   Prior Outpatient Therapy:    Past Medical History:  Past Medical History:  Diagnosis Date  . Anxiety   . Depression   . GERD (gastroesophageal reflux disease)   . Obesity   . Stroke ([place])   . Substance abuse [facility])     Past Surgical History:  Procedure Laterality Date  . ESOPHAGOGASTRODUODENOSCOPY (EGD) WITH PROPOFOL N/A 09/20/2017   Procedure: ESOPHAGOGASTRODUODENOSCOPY (EGD) WITH PROPOFOL;  Surgeon: Lin Landsman, MD;  Location: [facility];  Service: Gastroenterology;  Laterality: N/A;  . KNEE SURGERY Right    Family History:  Family History  Problem Relation Age of Onset  . Vasculitis Mother    Family Psychiatric  History: None known Social History:  Social History   Substance and Sexual Activity  Alcohol Use Not Currently     Social History   Substance and Sexual Activity  Drug Use Yes  . Types: Cocaine    Social History   Socioeconomic History  . Marital status: Single    Spouse name: Not on file  . Number of children: Not on file  . Years of education: Not on file  . Highest education level: Not on file  Occupational History  . Not on file  Tobacco Use  . Smoking status: Current Every Day Smoker    Packs/day: 0.50    Types: Cigarettes  . Smokeless tobacco: Never Used  Vaping Use  . Vaping Use: Never used  Substance and Sexual Activity  . Alcohol use: Not Currently  . Drug use: Yes    Types: Cocaine  . Sexual activity: Not on file  Other Topics Concern  . Not on file  Social History Narrative  . Not on file   Social Determinants of Health   Financial Resource Strain: Not on file  Food Insecurity: Not on file  Transportation Needs: Not on file  Physical Activity: Not on file  Stress: Not on file  Social Connections: Not on file   Additional Social  History:    Allergies:   Allergies  Allergen Reactions  . Acetaminophen Itching, Nausea And Vomiting and Rash    Labs:  Results for orders placed or performed during the hospital encounter of 07/24/20 (from the past 48 hour(s))  POC SARS Coronavirus 2 Ag-ED - Nasal Swab     Status: None   Collection Time: 07/28/20 10:10 AM  Result Value Ref Range   SARS Coronavirus 2 Ag NEGATIVE NEGATIVE    Comment: (NOTE) SARS-CoV-2 antigen NOT DETECTED.   Negative results are presumptive.  Negative results do not preclude SARS-CoV-2 infection and should not be used as the sole basis for treatment or other patient management decisions, including infection  control decisions, particularly in the presence of clinical signs and  symptoms consistent with COVID-19, or in those who have been in contact with the virus.  Negative results must be combined with clinical observations, patient history, and epidemiological information. The expected result is Negative.  Fact Sheet for Patients: HandmadeRecipes.com.cy  Fact Sheet for Healthcare Providers: FuneralLife.at  This test is not yet approved or cleared by the [place] FDA and  has been authorized for detection and/or diagnosis of SARS-CoV-2 by FDA under an Emergency Use Authorization (EUA).  This EUA will remain in effect (meaning this test can be used) for the duration of  the COV ID-19 declaration under Section 564(b)(1) of the Act, 21 U.S.C. section 360bbb-3(b)(1), unless the authorization is terminated or revoked sooner.      Current Facility-Administered Medications  Medication Dose Route Frequency Provider Last Rate Last Admin  . aspirin EC tablet 81 mg  81 mg Oral Daily Lucrezia Starch, MD   81 mg at 07/28/20 0901  . atorvastatin (LIPITOR) tablet 80 mg  80 mg Oral QPM Lucrezia Starch, MD   80 mg at 07/27/20 1701  . cephALEXin (KEFLEX) capsule 500 mg  500 mg Oral Q12H Lavonia Drafts,  MD   500 mg at 07/28/20 0902  . diclofenac Sodium (VOLTAREN) 1 % topical gel 4 g  4 g Topical QID Lucrezia Starch, MD   4 g at 07/28/20 0902  . famotidine (PEPCID) tablet 20 mg  20 mg Oral BID Lucrezia Starch, MD   20 mg at 07/28/20 0901  . gabapentin (NEURONTIN) capsule 400 mg  400 mg Oral TID Lucrezia Starch, MD   400 mg at 07/28/20 0901  . lisinopril (ZESTRIL) tablet 5 mg  5 mg Oral Daily Lucrezia Starch, MD   5 mg at 07/28/20 0901  . pantoprazole (PROTONIX) EC tablet 40 mg  40 mg Oral Daily Lucrezia Starch, MD   40 mg at 07/28/20 0902  . QUEtiapine (SEROQUEL) tablet 100 mg  100 mg Oral QHS Harvest Dark, MD   100 mg at 07/27/20 2137  . tiZANidine (ZANAFLEX) tablet 2 mg  2 mg Oral Q8H PRN Lucrezia Starch, MD       Current Outpatient Medications  Medication  Sig Dispense Refill  . QUEtiapine (SEROQUEL) 100 MG tablet Take 100 mg by mouth at bedtime.    Marland Kitchen aspirin EC 81 MG tablet Take 81 mg by mouth daily. Swallow whole.    Marland Kitchen atorvastatin (LIPITOR) 80 MG tablet Take 80 mg by mouth every evening.    . citalopram (CELEXA) 20 MG tablet Take 1 tablet (20 mg total) by mouth daily. 30 tablet 0  . diclofenac Sodium (VOLTAREN) 1 % GEL Apply 4 g topically 4 (four) times daily.    . famotidine (PEPCID) 20 MG tablet Take 20 mg by mouth 2 (two) times daily.    Marland Kitchen gabapentin (NEURONTIN) 400 MG capsule Take 400 mg by mouth 3 (three) times daily.    Marland Kitchen lidocaine (LIDODERM) 5 % Place 1 patch onto the skin daily. Remove & Discard patch within 12 hours or as directed by MD    . lisinopril (ZESTRIL) 5 MG tablet Take 1 tablet (5 mg total) by mouth daily. 30 tablet 0  . magnesium oxide (MAG-OX) 400 MG tablet Take 400 mg by mouth daily.    . pantoprazole (PROTONIX) 40 MG tablet Take 1 tablet (40 mg total) by mouth daily. 30 tablet 1  . QUEtiapine (SEROQUEL) 100 MG tablet Take 1 tablet (100 mg total) by mouth at bedtime for 14 days. 14 tablet 0  . Riboflavin 100 MG CAPS Take by mouth.    Marland Kitchen tiZANidine  (ZANAFLEX) 2 MG tablet Take 2 mg by mouth every 8 (eight) hours as needed for muscle spasms.      Musculoskeletal: Strength & Muscle Tone: within normal limits Gait & Station: normal Patient leans: N/A            Psychiatric Specialty Exam:  Presentation  General Appearance: Appropriate for Environment; Casual  Eye Contact:Good  Speech:Clear and Coherent; Normal Rate  Speech Volume:Normal  Handedness:Right   Mood and Affect  Mood:Euthymic  Affect:Appropriate; Congruent   Thought Process  Thought Processes:Coherent; Goal Directed  Descriptions of Associations:Intact  Orientation:Full (Time, Place and Person)  Thought Content:WDL  History of Schizophrenia/Schizoaffective disorder:No  Duration of Psychotic Symptoms:Less than six months  Hallucinations:No data recorded Ideas of Reference:None  Suicidal Thoughts:No data recorded Homicidal Thoughts:No data recorded  Sensorium  Memory:Immediate Good; Recent Good  Judgment:Intact  Insight:Present   Executive Functions  Concentration:Good  Attention Span:Good  [place] of Knowledge:Good  Language:Good   Psychomotor Activity  Psychomotor Activity:No data recorded  Assets  Assets:Communication Skills; Desire for Improvement   Sleep  Sleep:No data recorded  Physical Exam: Physical Exam Vitals and nursing note reviewed.  Constitutional:      Appearance: Normal appearance.  HENT:     Head: Normocephalic and atraumatic.     Mouth/Throat:     Pharynx: Oropharynx is clear.  Eyes:     Pupils: Pupils are equal, round, and reactive to light.  Cardiovascular:     Rate and Rhythm: Normal rate and regular rhythm.  Pulmonary:     Effort: Pulmonary effort is normal.     Breath sounds: Normal breath sounds.  Abdominal:     General: Abdomen is flat.     Palpations: Abdomen is soft.  Musculoskeletal:        General: Normal range of motion.  Skin:    General: Skin is warm and  dry.  Neurological:     General: No focal deficit present.     Mental Status: He is alert. Mental status is at baseline.  Psychiatric:  Attention and Perception: Attention normal.        Mood and Affect: Mood normal.        Speech: Speech normal.        Behavior: Behavior normal.        Thought Content: Thought content normal.        Cognition and Memory: Cognition normal.        Judgment: Judgment is impulsive.    Review of Systems  Constitutional: Negative.   HENT: Negative.   Eyes: Negative.   Respiratory: Negative.   Cardiovascular: Negative.   Gastrointestinal: Negative.   Musculoskeletal: Negative.   Skin: Negative.   Neurological: Negative.   Psychiatric/Behavioral: Positive for depression. Negative for memory loss. The patient does not have insomnia.    Blood pressure (!) 145/101, pulse 65, temperature 97.8 F (36.6 C), temperature source Oral, resp. rate 16, height 6\' 1"  (1.854 m), weight 117.9 kg, SpO2 98 %. Body mass index is 34.3 kg/m.  Treatment Plan Summary: Plan Patient appears to have stabilized and is presenting at his baseline mental state.  Irritable if not given what he wants.  Clearly forward thinking and valuing his comfort and his life.  Not psychotic.  Given our experience with the patient it does not appear likely that he would benefit from more requires inpatient hospitalization.  Suggest to the patient that we will discharge him today.  He has insisted all along that he has a safe place to live.  As always he is encouraged in the strongest terms to follow-up with outpatient treatment at [facility].  Discontinued IVC.  Case reviewed with emergency room physician and behavioral health staff.  Disposition: No evidence of imminent risk to self or others at present.   Patient does not meet criteria for psychiatric inpatient admission. Supportive therapy provided about ongoing stressors. Discussed crisis plan, support from social network, calling 911, coming to  the Emergency Department, and calling Suicide Hotline.  Alethia Berthold, MD 07/28/2020 11:35 AM

## 2020-07-30 ENCOUNTER — Other Ambulatory Visit: Payer: Self-pay

## 2020-07-30 ENCOUNTER — Emergency Department (HOSPITAL_COMMUNITY): Payer: Medicaid Other

## 2020-07-30 ENCOUNTER — Encounter (HOSPITAL_COMMUNITY): Payer: Self-pay

## 2020-07-30 ENCOUNTER — Emergency Department (HOSPITAL_COMMUNITY)
Admission: EM | Admit: 2020-07-30 | Discharge: 2020-07-31 | Disposition: A | Discharge status: Home / Self Care | Payer: Medicaid Other | Attending: Emergency Medicine | Admitting: Emergency Medicine

## 2020-07-30 DIAGNOSIS — Z20822 Contact with and (suspected) exposure to covid-19: Secondary | ICD-10-CM | POA: Insufficient documentation

## 2020-07-30 DIAGNOSIS — I10 Essential (primary) hypertension: Secondary | ICD-10-CM | POA: Insufficient documentation

## 2020-07-30 DIAGNOSIS — F1410 Cocaine abuse, uncomplicated: Secondary | ICD-10-CM | POA: Insufficient documentation

## 2020-07-30 DIAGNOSIS — K921 Melena: Secondary | ICD-10-CM | POA: Insufficient documentation

## 2020-07-30 DIAGNOSIS — Z79899 Other long term (current) drug therapy: Secondary | ICD-10-CM | POA: Insufficient documentation

## 2020-07-30 DIAGNOSIS — F444 Conversion disorder with motor symptom or deficit: Secondary | ICD-10-CM | POA: Insufficient documentation

## 2020-07-30 DIAGNOSIS — R0789 Other chest pain: Secondary | ICD-10-CM

## 2020-07-30 DIAGNOSIS — Z7982 Long term (current) use of aspirin: Secondary | ICD-10-CM | POA: Insufficient documentation

## 2020-07-30 DIAGNOSIS — F17210 Nicotine dependence, cigarettes, uncomplicated: Secondary | ICD-10-CM | POA: Insufficient documentation

## 2020-07-30 DIAGNOSIS — Z8616 Personal history of COVID-19: Secondary | ICD-10-CM | POA: Insufficient documentation

## 2020-07-30 DIAGNOSIS — R519 Headache, unspecified: Secondary | ICD-10-CM | POA: Insufficient documentation

## 2020-07-30 LAB — CBC
HCT: 45.1 % (ref 39.0–52.0)
Hemoglobin: 14.1 g/dL (ref 13.0–17.0)
MCH: 25.8 pg — ABNORMAL LOW (ref 26.0–34.0)
MCHC: 31.3 g/dL (ref 30.0–36.0)
MCV: 82.4 fL (ref 80.0–100.0)
Platelets: 310 10*3/uL (ref 150–400)
RBC: 5.47 MIL/uL (ref 4.22–5.81)
RDW: 15 % (ref 11.5–15.5)
WBC: 8.2 10*3/uL (ref 4.0–10.5)
nRBC: 0 % (ref 0.0–0.2)

## 2020-07-30 LAB — I-STAT CHEM 8, ED
BUN: 21 mg/dL — ABNORMAL HIGH (ref 6–20)
Calcium, Ion: 1.16 mmol/L (ref 1.15–1.40)
Chloride: 107 mmol/L (ref 98–111)
Creatinine, Ser: 0.8 mg/dL (ref 0.61–1.24)
Glucose, Bld: 113 mg/dL — ABNORMAL HIGH (ref 70–99)
HCT: 45 % (ref 39.0–52.0)
Hemoglobin: 15.3 g/dL (ref 13.0–17.0)
Potassium: 4 mmol/L (ref 3.5–5.1)
Sodium: 142 mmol/L (ref 135–145)
TCO2: 24 mmol/L (ref 22–32)

## 2020-07-30 LAB — DIFFERENTIAL
Abs Immature Granulocytes: 0.03 10*3/uL (ref 0.00–0.07)
Basophils Absolute: 0 10*3/uL (ref 0.0–0.1)
Basophils Relative: 0 %
Eosinophils Absolute: 0.1 10*3/uL (ref 0.0–0.5)
Eosinophils Relative: 1 %
Immature Granulocytes: 0 %
Lymphocytes Relative: 30 %
Lymphs Abs: 2.5 10*3/uL (ref 0.7–4.0)
Monocytes Absolute: 1.1 10*3/uL — ABNORMAL HIGH (ref 0.1–1.0)
Monocytes Relative: 13 %
Neutro Abs: 4.5 10*3/uL (ref 1.7–7.7)
Neutrophils Relative %: 56 %

## 2020-07-30 LAB — PROTIME-INR
INR: 1.1 (ref 0.8–1.2)
Prothrombin Time: 13.3 seconds (ref 11.4–15.2)

## 2020-07-30 LAB — CBG MONITORING, ED: Glucose-Capillary: 106 mg/dL — ABNORMAL HIGH (ref 70–99)

## 2020-07-30 LAB — APTT: aPTT: 28 seconds (ref 24–36)

## 2020-07-30 LAB — ETHANOL: Alcohol, Ethyl (B): <10 mg/dL (ref <10)

## 2020-07-30 MED ORDER — LORAZEPAM 2 MG/ML IJ SOLN: 2.0000 mg | Freq: Once | INTRAMUSCULAR | Status: DC | PRN

## 2020-07-30 NOTE — ED Triage Notes (Signed)
Pt bib [facility], code stroke.  Pt was driving and started having a headache and weakness which is similar to a previous CVA. Pt pulled over into a parking lot and was then assisted into a restaurant where staff called EMS.  EMS states pt's last known well was 2228 and pt had blurred vision, headache and weakness. Pt alert and oriented on arrival, pt feels weak.

## 2020-07-30 NOTE — Consult Note (Signed)
Neurology Consultation Reason for Consult: Code stroke for left sided weakness  Requesting Physician: Delora Fuel   CC: Left sided weakness and headache  History is obtained from: Patient and chart review   HPI: Samuel Molina is a 60 y.o. male with a past medical history significant for left Bell's palsy, recurrent left-sided weakness (complex migraine versus functional versus neurological disorder versus malingering, s/p tPA at [facility] on one presentation without any MRI evidence of stroke), ongoing tobacco abuse and cocaine use with history of alcohol use disorder as well, obesity.  He reports he was driving at 98 9:52 PM when he and a sudden onset headache, pulled over, and had left-sided weakness.  For EMS he was additionally noted to have a facial droop and perhaps some vision issues on the left side, and he reported Coumadin use to EMS.  He tells me he was started on Coumadin by Dr. Monna Fam at [facility] about 1 month ago due to his strokes.  On review of [facility] records, he presented with left-sided hemiplegia and headache on 06/26/2020 most recently.  "The diagnosis of Functional Neurologic Disorder was addressed with patient, however patient resistant to diagnosis and denies any recent stress. During previous admission, it was noted that patient's history was often conflicting with what family members corroborated. Of note, all three admissions including this one have been in the setting of recent cocaine use."  Additionally he was noted to request a letter faxed to his attorney as proof that he is currently admitted and unable to present to court for civil court case on 3/7, with concern that this represented malingering.  He was denied opiates for pain control of his chronic pain issues and in the setting would become violent towards staff.  In these records I cannot corroborate the patient's claimed that he was started on Coumadin.  To me the patient reports that his headache is left-sided,  pounding in nature, associated with light sensitivity, nausea, and that he did have an episode of emesis that he felt was mostly acid.  He additionally reports blood in his stool (bright red, and with regards to quantity "a lot").  He also reports chest pain 4-5/10 that is new this evening and has been stable since it started as well as shortness of breath that is improving.  He complains of patchy abdominal pain as well as difficulty swallowing "on the left side of my throat"  LKW: 22:28 PM tPA given?: No, due to stroke ruled out by imaging Premorbid modified rankin scale:      1 - No significant disability. Able to carry out all usual activities, despite some symptoms.  ROS: All other review of systems was negative except as noted in the HPI.   Past Medical History:  Diagnosis Date  . Anxiety   . Depression   . GERD (gastroesophageal reflux disease)   . Obesity   . Stroke ([place])   . Substance abuse [facility])    Past Surgical History:  Procedure Laterality Date  . ESOPHAGOGASTRODUODENOSCOPY (EGD) WITH PROPOFOL N/A 09/20/2017   Procedure: ESOPHAGOGASTRODUODENOSCOPY (EGD) WITH PROPOFOL;  Surgeon: Lin Landsman, MD;  Location: [facility];  Service: Gastroenterology;  Laterality: N/A;  . KNEE SURGERY Right    Current Outpatient Medications  Medication Instructions  . aspirin EC 81 mg, Oral, Daily, Swallow whole.  Marland Kitchen atorvastatin (LIPITOR) 80 mg, Oral, Every evening  . citalopram (CELEXA) 20 mg, Oral, Daily  . diclofenac Sodium (VOLTAREN) 4 g, Topical, 4 times daily  . famotidine (  PEPCID) 20 mg, Oral, 2 times daily  . gabapentin (NEURONTIN) 400 mg, Oral, 3 times daily  . lidocaine (LIDODERM) 5 % 1 patch, Transdermal, Every 24 hours, Remove & Discard patch within 12 hours or as directed by MD  . lisinopril (ZESTRIL) 5 mg, Oral, Daily  . pantoprazole (PROTONIX) 40 mg, Oral, Daily  . QUEtiapine (SEROQUEL) 100 mg, Oral, Nightly  . Riboflavin 100 mg, Oral, Daily  . tiZANidine  (ZANAFLEX) 2 mg, Oral, Every 8 hours PRN    Family History  Problem Relation Age of Onset  . Vasculitis Mother     Social History:  reports that he has been smoking cigarettes. He has been smoking about 0.50 packs per day. He has never used smokeless tobacco. He reports previous alcohol use. He reports current drug use. Drug: Cocaine.   Exam: Current vital signs: Ht 6\' 1"  (1.854 m)   Wt 120 kg   BMI 34.90 kg/m  Vital signs in last 24 hours: Weight:  [120 kg] 120 kg (04/08 2335)   Physical Exam  Constitutional: Appears well-developed and well-nourished.  Psych: Affect flat, overall cooperative at this time Eyes: No scleral injection HENT: No oropharyngeal obstruction.  MSK: no joint deformities.  Cardiovascular: Normal rate and regular rhythm.  Respiratory: Effort normal, non-labored breathing GI: Soft.  No distension. There is no tenderness.  Skin: Warm dry and intact visible skin  Neuro: Mental Status: Patient is awake, alert, oriented to person, place, month, year, and situation. Patient is able to give a clear and coherent history. No signs of aphasia or neglect Cranial Nerves: II: Visual Fields are full. Pupils are mildly unequal with the left slightly irregular and larger than the right but both are reactive (stable from prior exam) III,IV, VI: EOMI without ptosis or diploplia.  Inconsistent gaze preference V: Facial sensation is reportedly reduced on the left VII: Facial movement is notable for his stable left-sided Bell's palsy VIII: hearing is intact to voice X: Uvula elevates symmetrically XI: Shoulder shrug is symmetric. XII: tongue is midline without atrophy or fasciculations.  Motor: Tone is normal. Bulk is normal.  He continues to have a functional left-sided weakness of the left arm and leg that is inconsistent as previously described Sensory: He reports sensation is reduced on the left Deep Tendon Reflexes: 2+ and symmetric in the biceps and patellae.   Plantars: Toes are downgoing bilaterally.  Cerebellar: FNF and HKS are intact on the right, he does not make an effort with the left upper extremity and he makes a limited effort with the left lower extremity  NIHSS total 8 Score breakdown: 3 points for left facial droop (however this is chronic), 2 points for left upper extremity weakness, 2 points for left lower extremity weakness, 1 point for sensory loss on the left side  I have reviewed labs in epic and the results pertinent to this consultation are: U tox positive for/05/2020 for cocaine and tricyclics Ethanol level negative Creatinine 0.8, essentially normal CHEM profile and CBC though he does notably have a total protein of 8.3 and albumin of 4.2 with an elevated gamma gap of 4.2 IFX: with a polycloncal IgA increase in the past  Notably all INR checks in the EMR and in care everywhere have been normal  I have reviewed the images obtained: Head CT negative for acute intracranial process MRI brain without contrast negative for acute intracranial process   Impression: This is a 60 year old gentleman with past medical history as above presenting with his typical  left-sided weakness.  Differential diagnosis includes functional neurologic disorder, complex migraine, malingering/factitious disorder, versus Munchhausen's.  In particular his report of being on Coumadin arouses suspicion that he is not being truthful today, as neither I or pharmacy can find any corroborating evidence for this claim.  Recommendations: - MRI brain to rule out acute stroke completed on my recommendation - 10 mg Compazine and 25 mg Benadryl IV piggyback every 6 hours for headache management - substance use counseling, ETOH and cocaine cessation, diet, exercise and weight loss as primary stroke prevention strategies - Continue outpatient neurology follow-up for previously noted extensive left cranial nerve VII enhancement concerning for Bell's palsy versus  perineural tumor, as well as for migraine management and functional neurological disorder management - No further inpatient neurological needs at this time, neurology will be available on an as-needed basis going forward.  Please reach out if new questions arise  Lesleigh Noe MD-PhD [facility] [phone]
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
## 2020-07-31 ENCOUNTER — Other Ambulatory Visit: Payer: Self-pay

## 2020-07-31 ENCOUNTER — Ambulatory Visit (HOSPITAL_COMMUNITY)
Admission: EM | Admit: 2020-07-31 | Discharge: 2020-07-31 | Disposition: A | Discharge status: Home / Self Care | Payer: No Payment, Other | Attending: Behavioral Health | Admitting: Behavioral Health

## 2020-07-31 DIAGNOSIS — Z1330 Encounter for screening examination for mental health and behavioral disorders, unspecified: Secondary | ICD-10-CM | POA: Diagnosis not present

## 2020-07-31 DIAGNOSIS — F1420 Cocaine dependence, uncomplicated: Secondary | ICD-10-CM | POA: Insufficient documentation

## 2020-07-31 DIAGNOSIS — F332 Major depressive disorder, recurrent severe without psychotic features: Secondary | ICD-10-CM

## 2020-07-31 LAB — URINALYSIS, ROUTINE W REFLEX MICROSCOPIC
Bacteria, UA: NONE SEEN
Bilirubin Urine: NEGATIVE
Glucose, UA: NEGATIVE mg/dL
Hgb urine dipstick: NEGATIVE
Ketones, ur: 5 mg/dL — AB
Leukocytes,Ua: NEGATIVE
Nitrite: NEGATIVE
Protein, ur: 30 mg/dL — AB
Specific Gravity, Urine: 1.028 (ref 1.005–1.030)
pH: 5 (ref 5.0–8.0)

## 2020-07-31 LAB — RAPID URINE DRUG SCREEN, HOSP PERFORMED
Amphetamines: NOT DETECTED
Barbiturates: NOT DETECTED
Benzodiazepines: NOT DETECTED
Cocaine: POSITIVE — AB
Opiates: NOT DETECTED
Tetrahydrocannabinol: NOT DETECTED

## 2020-07-31 LAB — COMPREHENSIVE METABOLIC PANEL
ALT: 20 U/L (ref 0–44)
AST: 23 U/L (ref 15–41)
Albumin: 4.2 g/dL (ref 3.5–5.0)
Alkaline Phosphatase: 91 U/L (ref 38–126)
Anion gap: 9 (ref 5–15)
BUN: 18 mg/dL (ref 6–20)
CO2: 23 mmol/L (ref 22–32)
Calcium: 9.4 mg/dL (ref 8.9–10.3)
Chloride: 106 mmol/L (ref 98–111)
Creatinine, Ser: 1.03 mg/dL (ref 0.61–1.24)
GFR, Estimated: >60 mL/min (ref >60)
Glucose, Bld: 114 mg/dL — ABNORMAL HIGH (ref 70–99)
Potassium: 3.9 mmol/L (ref 3.5–5.1)
Sodium: 138 mmol/L (ref 135–145)
Total Bilirubin: 1 mg/dL (ref 0.3–1.2)
Total Protein: 8.3 g/dL — ABNORMAL HIGH (ref 6.5–8.1)

## 2020-07-31 LAB — RESP PANEL BY RT-PCR (FLU A&B, COVID) ARPGX2
Influenza A by PCR: NEGATIVE
Influenza B by PCR: NEGATIVE
SARS Coronavirus 2 by RT PCR: NEGATIVE

## 2020-07-31 LAB — TROPONIN I (HIGH SENSITIVITY)
Troponin I (High Sensitivity): 10 ng/L (ref <18)
Troponin I (High Sensitivity): 7 ng/L (ref <18)

## 2020-07-31 MED ORDER — PROCHLORPERAZINE EDISYLATE 10 MG/2ML IJ SOLN
10.0000 mg | Freq: Four times a day (QID) | INTRAMUSCULAR | Status: DC
  Administered 2020-07-31: 10 mg via INTRAVENOUS

## 2020-07-31 MED ORDER — SODIUM CHLORIDE 0.9 % IV SOLN
25.0000 mg | Freq: Four times a day (QID) | INTRAVENOUS | Status: DC
  Administered 2020-07-31: 25 mg via INTRAVENOUS
  Filled 2020-07-31 (×4): qty 0.5

## 2020-07-31 MED ORDER — PROCHLORPERAZINE EDISYLATE 10 MG/2ML IJ SOLN
10.0000 mg | Freq: Four times a day (QID) | INTRAMUSCULAR | Status: DC
  Filled 2020-07-31: qty 2

## 2020-07-31 MED ORDER — SODIUM CHLORIDE 0.9 % IV SOLN: 25.0000 mg | Freq: Four times a day (QID) | INTRAVENOUS | Status: DC

## 2020-07-31 MED ORDER — SODIUM CHLORIDE 0.9 % IV SOLN
25.0000 mg | Freq: Four times a day (QID) | INTRAVENOUS | Status: DC
  Filled 2020-07-31 (×2): qty 0.5

## 2020-07-31 MED ORDER — HYDROMORPHONE HCL 1 MG/ML IJ SOLN
1.0000 mg | Freq: Once | INTRAMUSCULAR | Status: DC
  Filled 2020-07-31: qty 1

## 2020-07-31 MED ORDER — PROCHLORPERAZINE EDISYLATE 10 MG/2ML IJ SOLN: 10.0000 mg | Freq: Four times a day (QID) | INTRAMUSCULAR | Status: DC | PRN

## 2020-07-31 NOTE — Progress Notes (Signed)
Samuel Molina was given his AVS and received his personal belongings.

## 2020-07-31 NOTE — ED Notes (Signed)
ED tech went to reconnect BP cuff to get another reading. Pt refused to have bp cuff on. Pt stated " I don't want no more blood pressures taken."

## 2020-07-31 NOTE — ED Notes (Signed)
While preparing to discharge patient, patient noted that he was missing his watch and glasses. Patient did have wallet with his cash in his pants. ED Techs searched CT area for patients watch & glasses. Charge Nurse aware. Patient glasses and watch not found.  Patient declined to make an incident report with security at this time.
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
## 2020-07-31 NOTE — ED Notes (Signed)
Patient reporting headache, advised that we are currently waiting on meds from pharmacy. Patient agreeable and asking for food. MD aware and ok'd food for patient. Patient appears in no acute distress. Patient now eating with left arm that was previously unable to move.

## 2020-07-31 NOTE — Discharge Instructions (Signed)
You have been diagnosed with functional neurologic disorder.  This has been noted in several recent hospitalizations.  There is no sign that you have had a stroke.  You need to work with the neurologist for ongoing treatment.  Regarding the blood in your stool, you need to make an appointment with the gastroenterologist, and you will probably need to have a colonoscopy.  All of your problems are made worse from using cocaine.  Do not use cocaine.

## 2020-07-31 NOTE — Discharge Instructions (Signed)
Take all medications as prescribed. Keep all follow-up appointments as scheduled.  Do not consume alcohol or use illegal drugs while on prescription medications. Report any adverse effects from your medications to your primary care provider promptly.  In the event of recurrent symptoms or worsening symptoms, call 911, a crisis hotline, or go to the nearest emergency department for evaluation.   
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
## 2020-07-31 NOTE — ED Notes (Signed)
Pt returned to room, code stroke cancelled at [place]

## 2020-08-23 DIAGNOSIS — F444 Conversion disorder with motor symptom or deficit: Secondary | ICD-10-CM | POA: Insufficient documentation

## 2020-11-01 ENCOUNTER — Emergency Department
Admission: EM | Admit: 2020-11-01 | Discharge: 2020-11-02 | Disposition: A | Discharge status: Other Acute Inpatient Hospital | Payer: 59 | Attending: Emergency Medicine | Admitting: Emergency Medicine

## 2020-11-01 ENCOUNTER — Other Ambulatory Visit: Payer: Self-pay

## 2020-11-01 DIAGNOSIS — F332 Major depressive disorder, recurrent severe without psychotic features: Secondary | ICD-10-CM | POA: Diagnosis present

## 2020-11-01 DIAGNOSIS — F609 Personality disorder, unspecified: Secondary | ICD-10-CM | POA: Diagnosis present

## 2020-11-01 DIAGNOSIS — I10 Essential (primary) hypertension: Secondary | ICD-10-CM | POA: Insufficient documentation

## 2020-11-01 DIAGNOSIS — Z20822 Contact with and (suspected) exposure to covid-19: Secondary | ICD-10-CM | POA: Insufficient documentation

## 2020-11-01 DIAGNOSIS — I639 Cerebral infarction, unspecified: Secondary | ICD-10-CM | POA: Diagnosis present

## 2020-11-01 DIAGNOSIS — F17210 Nicotine dependence, cigarettes, uncomplicated: Secondary | ICD-10-CM | POA: Insufficient documentation

## 2020-11-01 DIAGNOSIS — Y900 Blood alcohol level of less than 20 mg/100 ml: Secondary | ICD-10-CM | POA: Insufficient documentation

## 2020-11-01 DIAGNOSIS — F329 Major depressive disorder, single episode, unspecified: Secondary | ICD-10-CM | POA: Diagnosis present

## 2020-11-01 DIAGNOSIS — G459 Transient cerebral ischemic attack, unspecified: Secondary | ICD-10-CM | POA: Diagnosis present

## 2020-11-01 DIAGNOSIS — R45851 Suicidal ideations: Secondary | ICD-10-CM | POA: Insufficient documentation

## 2020-11-01 DIAGNOSIS — Z7982 Long term (current) use of aspirin: Secondary | ICD-10-CM | POA: Insufficient documentation

## 2020-11-01 DIAGNOSIS — F32A Depression, unspecified: Secondary | ICD-10-CM | POA: Diagnosis present

## 2020-11-01 DIAGNOSIS — Z720 Tobacco use: Secondary | ICD-10-CM | POA: Diagnosis present

## 2020-11-01 DIAGNOSIS — Z8616 Personal history of COVID-19: Secondary | ICD-10-CM | POA: Insufficient documentation

## 2020-11-01 DIAGNOSIS — Z765 Malingerer [conscious simulation]: Secondary | ICD-10-CM

## 2020-11-01 DIAGNOSIS — F1420 Cocaine dependence, uncomplicated: Secondary | ICD-10-CM | POA: Diagnosis present

## 2020-11-01 DIAGNOSIS — Z8673 Personal history of transient ischemic attack (TIA), and cerebral infarction without residual deficits: Secondary | ICD-10-CM | POA: Diagnosis present

## 2020-11-01 DIAGNOSIS — F1994 Other psychoactive substance use, unspecified with psychoactive substance-induced mood disorder: Secondary | ICD-10-CM | POA: Diagnosis present

## 2020-11-01 DIAGNOSIS — Z79899 Other long term (current) drug therapy: Secondary | ICD-10-CM | POA: Insufficient documentation

## 2020-11-01 DIAGNOSIS — F1410 Cocaine abuse, uncomplicated: Secondary | ICD-10-CM | POA: Diagnosis present

## 2020-11-01 LAB — SALICYLATE LEVEL: Salicylate Lvl: <7 mg/dL — ABNORMAL LOW (ref 7.0–30.0)

## 2020-11-01 LAB — COMPREHENSIVE METABOLIC PANEL
ALT: 17 U/L (ref 0–44)
AST: 28 U/L (ref 15–41)
Albumin: 4.6 g/dL (ref 3.5–5.0)
Alkaline Phosphatase: 98 U/L (ref 38–126)
Anion gap: 9 (ref 5–15)
BUN: 21 mg/dL — ABNORMAL HIGH (ref 6–20)
CO2: 24 mmol/L (ref 22–32)
Calcium: 9.3 mg/dL (ref 8.9–10.3)
Chloride: 103 mmol/L (ref 98–111)
Creatinine, Ser: 1.08 mg/dL (ref 0.61–1.24)
GFR, Estimated: >60 mL/min (ref >60)
Glucose, Bld: 156 mg/dL — ABNORMAL HIGH (ref 70–99)
Potassium: 4 mmol/L (ref 3.5–5.1)
Sodium: 136 mmol/L (ref 135–145)
Total Bilirubin: 1.2 mg/dL (ref 0.3–1.2)
Total Protein: 8.4 g/dL — ABNORMAL HIGH (ref 6.5–8.1)

## 2020-11-01 LAB — CBC
HCT: 46.6 % (ref 39.0–52.0)
Hemoglobin: 15.3 g/dL (ref 13.0–17.0)
MCH: 26.5 pg (ref 26.0–34.0)
MCHC: 32.8 g/dL (ref 30.0–36.0)
MCV: 80.8 fL (ref 80.0–100.0)
Platelets: 320 10*3/uL (ref 150–400)
RBC: 5.77 MIL/uL (ref 4.22–5.81)
RDW: 15.4 % (ref 11.5–15.5)
WBC: 7.5 10*3/uL (ref 4.0–10.5)
nRBC: 0 % (ref 0.0–0.2)

## 2020-11-01 LAB — ETHANOL: Alcohol, Ethyl (B): <10 mg/dL (ref <10)

## 2020-11-01 LAB — ACETAMINOPHEN LEVEL: Acetaminophen (Tylenol), Serum: <10 ug/mL — ABNORMAL LOW (ref 10–30)

## 2020-11-01 MED ORDER — METHOCARBAMOL 750 MG PO TABS
750.0000 mg | ORAL_TABLET | ORAL | Status: AC
  Administered 2020-11-01: 750 mg via ORAL
  Filled 2020-11-01: qty 1

## 2020-11-01 MED ORDER — NAPROXEN 500 MG PO TABS
500.0000 mg | ORAL_TABLET | ORAL | Status: AC
  Administered 2020-11-01: 500 mg via ORAL
  Filled 2020-11-01: qty 1
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
## 2020-11-01 NOTE — ED Notes (Signed)
Hourly rounding performed, patient currently awake in room. Patient has no complaints at this time. Q15 minute rounds and monitoring via [place] and Officer to continue. 

## 2020-11-01 NOTE — ED Notes (Signed)
Pt notified of med ordered and awaiting pharmacy to send medication. Pt becomes irritated because he has not seen doctor meds are being prescribed without being evaluated. Pt requests to speak to dr first. Dr. Joni Fears notified, states he will go see patient

## 2020-11-01 NOTE — ED Notes (Signed)
Pt speaks to Dr. Joni Fears now, in room with lights off and door closed.
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
## 2020-11-01 NOTE — ED Notes (Signed)
Report received from [place], Conservation officer, nature. Patient alert and oriented, warm and dry, and in no acute distress. Patient denies AVH and pain. Patient made aware of Q15 minute rounds and Engineer, drilling presence for their safety. Patient instructed to come to this nurse with needs or concerns.

## 2020-11-01 NOTE — ED Notes (Addendum)
Hourly rounding performed, patient currently awake in room. Patient still awaiting Dr. Elizabeth Sauer. Q15 minute rounds and monitoring via Engineer, drilling to continue.

## 2020-11-01 NOTE — ED Provider Notes (Signed)
[facility] Emergency Department Provider Note  ____________________________________________  Time seen: Approximately 6:05 PM  I have reviewed the triage vital signs and the nursing notes.   HISTORY  Chief Complaint Suicide Attempt    Level 5 Caveat: Portions of the History and Physical including HPI and review of systems are unable to be completely obtained due to patient being a poor historian   HPI Samuel Molina is a 60 y.o. male with a history of substance abuse, GERD, anxiety, depression who comes the ED complaining of using cocaine last night and subsequently taking an overdose of multiple medications and holding a gun and contemplating suicide.  Patient denies any injuries so far.  He is well-known to this department for recurrent suicidal ideation.  Feels safe in the ED.  No recent illness.    Past Medical History:  Diagnosis Date  . Anxiety   . Depression   . GERD (gastroesophageal reflux disease)   . Obesity   . Stroke ([place])   . Substance abuse [facility])      Patient Active Problem List   Diagnosis Date Noted  . 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 Cocaine use disorder, severe, dependence ([place])   . TIA (transient ischemic attack) 06/05/2020  . Stroke ([place])   . Depression   . HTN (hypertension)   . HLD (hyperlipidemia)   . Tobacco abuse   . Chest pain   . Left-sided weakness   . COVID-19 virus infection   . Bell's palsy 04/26/2020  . Malingering 03/25/2020  . Personality disorder ([place]) 02/13/2020  . Bursitis 02/13/2020  . Substance induced mood disorder ([place]) 02/12/2020  . Severe recurrent major depression without psychotic features ([place]) 01/21/2020  . Cocaine abuse ([place]) 01/21/2020  . Hip pain 01/21/2020  . Major depression 01/20/2020  . Gastroesophageal reflux disease with esophagitis   . Morbid obesity ([place]) 09/19/2017  . Arthritis of knee 11/03/2015     Past Surgical History:   Procedure Laterality Date  . ESOPHAGOGASTRODUODENOSCOPY (EGD) WITH PROPOFOL N/A 09/20/2017   Procedure: ESOPHAGOGASTRODUODENOSCOPY (EGD) WITH PROPOFOL;  Surgeon: Lin Landsman, MD;  Location: [place];  Service: Gastroenterology;  Laterality: N/A;  . KNEE SURGERY Right      Prior to Admission medications   Medication Sig Start Date End Date Taking? Authorizing Provider  aspirin EC 81 MG tablet Take 81 mg by mouth daily. Swallow whole.    [provider]  atorvastatin (LIPITOR) 80 MG tablet Take 80 mg by mouth every evening.    [provider]  citalopram (CELEXA) 20 MG tablet Take 1 tablet (20 mg total) by mouth daily. 06/11/20 07/11/20  [name], MD  diclofenac Sodium (VOLTAREN) 1 % GEL Apply 4 g topically 4 (four) times daily.    [provider]  famotidine (PEPCID) 20 MG tablet Take 20 mg by mouth 2 (two) times daily.    [provider]  gabapentin (NEURONTIN) 400 MG capsule Take 400 mg by mouth 3 (three) times daily.    [provider]  lidocaine (LIDODERM) 5 % Place 1 patch onto the skin daily. Remove & Discard patch within 12 hours or as directed by MD    [provider]  lisinopril (ZESTRIL) 5 MG tablet Take 1 tablet (5 mg total) by mouth daily. 06/11/20 07/11/20  [name], MD  pantoprazole (PROTONIX) 40 MG tablet Take 1 tablet (40 mg total) by mouth daily. 05/26/20 05/26/21  Fransico Meadow, PA-C  QUEtiapine (  SEROQUEL) 100 MG tablet Take 100 mg by mouth at bedtime.    [provider]  QUEtiapine (SEROQUEL) 300 MG tablet TAKE 1 TABLET BY MOUTH AT BEDTIME 02/23/20 02/22/21  [name], MD  Riboflavin 100 MG CAPS Take 100 mg by mouth daily.    [provider]  tiZANidine (ZANAFLEX) 2 MG tablet Take 2 mg by mouth every 8 (eight) hours as needed for muscle spasms.    [provider]  omeprazole (PRILOSEC OTC) 20 MG tablet Take 2 tablets (40 mg total) by mouth 2 (two) times daily  before a meal. 09/19/17 01/29/19  Lin Landsman, MD     Allergies Acetaminophen   Family History  Problem Relation Age of Onset  . Vasculitis Mother     Social History Social History   Tobacco Use  . Smoking status: Every Day    Packs/day: 0.50    Pack years: 0.00    Types: Cigarettes  . Smokeless tobacco: Never  Vaping Use  . Vaping Use: Never used  Substance Use Topics  . Alcohol use: Not Currently  . Drug use: Yes    Types: Cocaine    Review of Systems Level 5 Caveat: Portions of the History and Physical including HPI and review of systems are unable to be completely obtained due to patient being a poor historian   Constitutional:   No known fever.  ENT:   No rhinorrhea. Cardiovascular:   No chest pain or syncope. Respiratory:   No dyspnea or cough. Gastrointestinal:   Negative for abdominal pain, vomiting and diarrhea.  Musculoskeletal:   Negative for focal pain or swelling ____________________________________________   PHYSICAL EXAM:  VITAL SIGNS: ED Triage Vitals  Enc Vitals Group     BP 11/01/20 1415 (!) 137/93     Pulse Rate 11/01/20 1415 88     Resp 11/01/20 1415 17     Temp 11/01/20 1415 98.3 F (36.8 C)     Temp Source 11/01/20 1415 Oral     SpO2 11/01/20 1415 98 %     Weight 11/01/20 1416 245 lb (111.1 kg)     Height 11/01/20 1416 6\' 1"  (1.854 m)     Head Circumference --      Peak Flow --      Pain Score --      Pain Loc --      Pain Edu? --      Excl. in [place]? --     Vital signs reviewed, nursing assessments reviewed.   Constitutional:   Alert and oriented. Non-toxic appearance. Eyes:   Conjunctivae are normal. EOMI. PERRL. ENT      Head:   Normocephalic and atraumatic.      Nose:   No congestion/rhinnorhea.       Mouth/Throat:   MMM, no pharyngeal erythema. No peritonsillar mass.       Neck:   No meningismus. Full ROM.  Cardiovascular:   RRR.  Cap refill less than 2 seconds. Respiratory:   Normal respiratory effort without  tachypnea/retractions. Breath sounds are clear and equal bilaterally.  Gastrointestinal:   Soft and nontender. Non distended. There is no CVA tenderness.  No rebound, rigidity, or guarding. Musculoskeletal:   Normal range of motion in all extremities. No joint effusions.  No lower extremity tenderness.  No edema. Neurologic:   Normal speech and language.  Motor grossly intact. No acute focal neurologic deficits are appreciated.  Skin:    Skin is warm, dry and intact. No rash  noted.  No petechiae, purpura, or bullae.  ____________________________________________    LABS (pertinent positives/negatives) (all labs ordered are listed, but only abnormal results are displayed) Labs Reviewed  COMPREHENSIVE METABOLIC PANEL - Abnormal; Notable for the following components:      Result Value   Glucose, Bld 156 (*)    BUN 21 (*)    Total Protein 8.4 (*)    All other components within normal limits  SALICYLATE LEVEL - Abnormal; Notable for the following components:   Salicylate Lvl <2.7 (*)    All other components within normal limits  ACETAMINOPHEN LEVEL - Abnormal; Notable for the following components:   Acetaminophen (Tylenol), Serum <10 (*)    All other components within normal limits  SARS CORONAVIRUS 2 (TAT 6-24 HRS)  ETHANOL  CBC  URINE DRUG SCREEN, QUALITATIVE ([facility] ONLY)   ____________________________________________   EKG    ____________________________________________    RADIOLOGY  No results found.  ____________________________________________   PROCEDURES Procedures  ____________________________________________    CLINICAL IMPRESSION / ASSESSMENT AND PLAN / ED COURSE  Medications ordered in the ED: Medications - No data to display  Pertinent labs & imaging results that were available during my care of the patient were reviewed by me and considered in my medical decision making (see chart for details).   Samuel Molina was evaluated in Emergency  Department on 11/01/2020 for the symptoms described in the history of present illness. He was evaluated in the context of the global COVID-19 pandemic, which necessitated consideration that the patient might be at risk for infection with the SARS-CoV-2 virus that causes COVID-19. Institutional protocols and algorithms that pertain to the evaluation of patients at risk for COVID-19 are in a state of rapid change based on information released by regulatory bodies including the CDC and federal and state organizations. These policies and algorithms were followed during the patient's care in the ED.   Patient with a history of polysubstance abuse comes ED complaining of suicidal symptoms in the setting of recent cocaine use.  Will observe for improvement in symptoms, consult psychiatry.  Not committable at this time.  Patient is well-known to this department.  Clinical Course as of 11/01/20 2349  Mon Nov 01, 2020  2347 She complains of his chronic leg pain.  Legs are normal on exam, doubt DVT or arterial injury.  He is does not have symptoms of claudication.  No swelling or soft tissue inflammation.  Likely restless legs.  Offered ibuprofen and Robaxin. [PS]  2348 Patient relates a extensive story about multiple life stressors and multiple attempts to harm himself recently.  He is well-known to this department and is likely all malingering and substance abuse related, but will IVC to ensure safety pending further evaluation. [PS]    Clinical Course User Index [PS] Carrie Mew, MD     ____________________________________________   FINAL CLINICAL IMPRESSION(S) / ED DIAGNOSES    Final diagnoses:  Cocaine abuse [facility])     ED Discharge Orders     None       Portions of this note were generated with dragon dictation software. Dictation errors may occur despite best attempts at proofreading.   Carrie Mew, MD 11/01/20 1807

## 2020-11-01 NOTE — ED Notes (Signed)
Pt has had a couple of snacks this evening. Pt was very insistent on talking with the RN and the Dr. When staff arrived at 1900. Pt said that his legs were having spasms he wanted something for them. Tech informed pt that she would let RN know. Tech told [employer]. The RN messaged doctor about meds. Pt has been cooperative for the most part. Pt is now resting and sleeping soundly in bed with lights out.

## 2020-11-01 NOTE — ED Notes (Signed)
VOL, pend psych consult

## 2020-11-01 NOTE — ED Notes (Signed)
Given dinner

## 2020-11-01 NOTE — ED Notes (Signed)
Pt request that his wallet stay with his belongs, states he does have any cash.

## 2020-11-01 NOTE — ED Triage Notes (Signed)
Pt states he took a lot his prescription medication last night and held a gun to himself last night in attempt to harm himself, pt is a/ox4. Pt was seen leaving the [facility] and went downstairs to the cafeteria got food and came back to the [facility].

## 2020-11-01 NOTE — Consult Note (Signed)
[facility] Psychiatry Consult   Reason for Consult: Suicide Attempt Referring Physician: Dr. Joni Fears Patient Identification: Samuel Molina MRN:  [id number] Principal Diagnosis: <principal problem not specified> Diagnosis:  Active Problems:   Major depression   Severe recurrent major depression without psychotic features ([place])   Cocaine abuse ([place])   Substance induced mood disorder ([place])   Personality disorder ([place])   Malingering   TIA (transient ischemic attack)   Stroke ([place])   Depression   Tobacco abuse   Cocaine use disorder, severe, dependence ([place])   Total Time spent with patient: 20 minutes  Subjective: "I don't want to be here anymore. I mean on this earth." Samuel Molina is a 60 y.o. male patient presented to [facility] ED via EMS voluntarily voiced that he attempted suicide by putting a gun to his head. He explained that the "only reason I did not do it was because my nephew stopped me." The patient stated that he overdosed on lots of pills and used cocaine heavily last night. We do not have any UDS results. Checking his labs, he has not rendered any urine to the nurse. The patient is calm, cooperative, and very pleasant during this visit. The patient shared that during his last hospitalization in the inpatient unit, he was helped " a lot" by Dr. Domingo Cocking.  The patient was seen face-to-face by this provider; the chart was reviewed and consulted with Dr. Joni Fears on 11/01/2020 due to the patient's care. It was discussed with the EDP that the patient does meet the criteria to be admitted to a psychiatric inpatient unit.  On evaluation, the patient is alert and oriented x4, calm, cooperative, and mood-congruent with affect. The patient admits to using substances, cocaine in particular. The patient stated that he ran out of his psychiatric medications. Today he is saying he does not take psychiatric medications because he does not have a psychiatric provider who would prescribe  them to him on an outpatient basis. During the patient's past visits, he voiced that he does not take psychiatric medications "because I do not have any mental problems." The patient does not appear to be responding to internal or external stimuli. Neither is the patient presenting with any delusional thinking. The patient denies auditory or visual hallucinations. The patient admits to a suicide attempt by putting a gun to his head and overdosing on "lots of pills." He denies homicidal ideations. The patient is not presenting with any psychotic or paranoid behaviors. During an encounter with the patient, he could answer questions appropriately.   HPI: Per Dr. Joni Fears, Samuel Molina is a 60 y.o. male with a history of substance abuse, GERD, anxiety, depression who comes the ED complaining of using cocaine last night and subsequently taking an overdose of multiple medications and holding a gun and contemplating suicide.  Patient denies any injuries so far.  He is well-known to this department for recurrent suicidal ideation.  Feels safe in the ED.  No recent illness.  Past Psychiatric History:  Anxiety Depression Stroke ([place]) Substance Abuse ([place])  Risk to Self:   Risk to Others:   Prior Inpatient Therapy:   Prior Outpatient Therapy:    Past Medical History:  Past Medical History:  Diagnosis Date   Anxiety    Depression    GERD (gastroesophageal reflux disease)    Obesity    Stroke [facility])    Substance abuse ([place])     Past Surgical History:  Procedure Laterality Date   ESOPHAGOGASTRODUODENOSCOPY (EGD) WITH PROPOFOL N/A 09/20/2017  Procedure: ESOPHAGOGASTRODUODENOSCOPY (EGD) WITH PROPOFOL;  Surgeon: Lin Landsman, MD;  Location: [facility] ENDOSCOPY;  Service: Gastroenterology;  Laterality: N/A;   KNEE SURGERY Right    Family History:  Family History  Problem Relation Age of Onset   Vasculitis Mother    Family Psychiatric  History:  Social History:  Social History   Substance  and Sexual Activity  Alcohol Use Not Currently     Social History   Substance and Sexual Activity  Drug Use Yes   Types: Cocaine    Social History   Socioeconomic History   Marital status: Single    Spouse name: Not on file   Number of children: Not on file   Years of education: Not on file   Highest education level: Not on file  Occupational History   Not on file  Tobacco Use   Smoking status: Every Day    Packs/day: 0.50    Pack years: 0.00    Types: Cigarettes   Smokeless tobacco: Never  Vaping Use   Vaping Use: Never used  Substance and Sexual Activity   Alcohol use: Not Currently   Drug use: Yes    Types: Cocaine   Sexual activity: Not on file  Other Topics Concern   Not on file  Social History Narrative   Not on file   Social Determinants of Health   Financial Resource Strain: Not on file  Food Insecurity: Not on file  Transportation Needs: Not on file  Physical Activity: Not on file  Stress: Not on file  Social Connections: Not on file   Additional Social History:    Allergies:   Allergies  Allergen Reactions   Acetaminophen Itching, Nausea And Vomiting and Rash    Labs:  Results for orders placed or performed during the hospital encounter of 11/01/20 (from the past 48 hour(s))  Comprehensive metabolic panel     Status: Abnormal   Collection Time: 11/01/20  2:20 PM  Result Value Ref Range   Sodium 136 135 - 145 mmol/L   Potassium 4.0 3.5 - 5.1 mmol/L   Chloride 103 98 - 111 mmol/L   CO2 24 22 - 32 mmol/L   Glucose, Bld 156 (H) 70 - 99 mg/dL    Comment: Glucose reference range applies only to samples taken after fasting for at least 8 hours.   BUN 21 (H) 6 - 20 mg/dL   Creatinine, Ser 1.08 0.61 - 1.24 mg/dL   Calcium 9.3 8.9 - 10.3 mg/dL   Total Protein 8.4 (H) 6.5 - 8.1 g/dL   Albumin 4.6 3.5 - 5.0 g/dL   AST 28 15 - 41 U/L   ALT 17 0 - 44 U/L   Alkaline Phosphatase 98 38 - 126 U/L   Total Bilirubin 1.2 0.3 - 1.2 mg/dL   GFR, Estimated  >60 >60 mL/min    Comment: (NOTE) Calculated using the CKD-EPI Creatinine Equation (2021)    Anion gap 9 5 - 15    Comment: Performed at [facility], [address] [place], [place] [address]  Ethanol     Status: None   Collection Time: 11/01/20  2:20 PM  Result Value Ref Range   Alcohol, Ethyl (B) <10 <10 mg/dL    Comment: (NOTE) Lowest detectable limit for serum alcohol is 10 mg/dL.  For medical purposes only. Performed at [facility], [address], [place],  [address]   Salicylate level     Status: Abnormal   Collection Time: 11/01/20  2:20 PM  Result Value Ref Range   Salicylate Lvl <6.1 (L) 7.0 - 30.0 mg/dL    Comment: Performed at [facility], [address] [place], [place] [address]  Acetaminophen level     Status: Abnormal   Collection Time: 11/01/20  2:20 PM  Result Value Ref Range   Acetaminophen (Tylenol), Serum <10 (L) 10 - 30 ug/mL    Comment: (NOTE) Therapeutic concentrations vary significantly. A range of 10-30 ug/mL  may be an effective concentration for many patients. However, some  are best treated at concentrations outside of this range. Acetaminophen concentrations >150 ug/mL at 4 hours after ingestion  and >50 ug/mL at 12 hours after ingestion are often associated with  toxic reactions.  Performed at [facility], [address] [place], [place] [address]   cbc     Status: None   Collection Time: 11/01/20  2:20 PM  Result Value Ref Range   WBC 7.5 4.0 - 10.5 K/uL   RBC 5.77 4.22 - 5.81 MIL/uL   Hemoglobin 15.3 13.0 - 17.0 g/dL   HCT 46.6 39.0 - 52.0 %   MCV 80.8 80.0 - 100.0 fL   MCH 26.5 26.0 - 34.0 pg   MCHC 32.8 30.0 - 36.0 g/dL   RDW 15.4 11.5 - 15.5 %   Platelets 320 150 - 400 K/uL   nRBC 0.0 0.0 - 0.2 %    Comment: Performed at [facility], [address] [place], [place] [address]    Current Facility-Administered Medications  Medication Dose Route Frequency Provider  Last Rate Last Admin   methocarbamol (ROBAXIN) tablet 750 mg  750 mg Oral Roel Cluck, MD       Current Outpatient Medications  Medication Sig Dispense Refill   aspirin EC 81 MG tablet Take 81 mg by mouth daily. Swallow whole.     atorvastatin (LIPITOR) 80 MG tablet Take 80 mg by mouth every evening.     citalopram (CELEXA) 20 MG tablet Take 1 tablet (20 mg total) by mouth daily. 30 tablet 0   diclofenac Sodium (VOLTAREN) 1 % GEL Apply 4 g topically 4 (four) times daily.     famotidine (PEPCID) 20 MG tablet Take 20 mg by mouth 2 (two) times daily.     gabapentin (NEURONTIN) 400 MG capsule Take 400 mg by mouth 3 (three) times daily.     lidocaine (LIDODERM) 5 % Place 1 patch onto the skin daily. Remove & Discard patch within 12 hours or as directed by MD     lisinopril (ZESTRIL) 5 MG tablet Take 1 tablet (5 mg total) by mouth daily. 30 tablet 0   pantoprazole (PROTONIX) 40 MG tablet Take 1 tablet (40 mg total) by mouth daily. 30 tablet 1   QUEtiapine (SEROQUEL) 100 MG tablet Take 100 mg by mouth at bedtime.     QUEtiapine (SEROQUEL) 300 MG tablet TAKE 1 TABLET BY MOUTH AT BEDTIME 7 tablet 0   Riboflavin 100 MG CAPS Take 100 mg by mouth daily.     tiZANidine (ZANAFLEX) 2 MG tablet Take 2 mg by mouth every 8 (eight) hours as needed for muscle spasms.      Musculoskeletal: Strength & Muscle Tone: decreased Gait & Station: normal Patient leans: N/A  Psychiatric Specialty Exam:  Presentation  General Appearance: Appropriate for Environment; Casual  Eye Contact:Good  Speech:Clear and Coherent; Normal Rate  Speech Volume:Normal  Handedness:Right   Mood and Affect  Mood:Euthymic  Affect:Appropriate; Congruent   Thought  Process  Thought Processes:Coherent  Descriptions of Associations:Intact  Orientation:Full (Time, Place and Person)  Thought Content:Logical; WDL  History of Schizophrenia/Schizoaffective disorder:No  Duration of Psychotic Symptoms:Less than  six months  Hallucinations:Hallucinations: None  Ideas of Reference:None  Suicidal Thoughts:Suicidal Thoughts: Yes, Passive SI Passive Intent and/or Plan: With Intent; With Plan  Homicidal Thoughts:Homicidal Thoughts: No   Sensorium  Memory:Immediate Good; Recent Good; Remote Good  Judgment:Intact  Insight:Fair   Executive Functions  Concentration:Good  Attention Span:Good  [place] of Knowledge:Good  Language:Good   Psychomotor Activity  Psychomotor Activity:Psychomotor Activity: Normal   Assets  Assets:Communication Skills; Desire for Improvement   Sleep  Sleep:Sleep: Good   Physical Exam: Physical Exam Vitals and nursing note reviewed.  Constitutional:      Appearance: Normal appearance. He is obese.  HENT:     Head: Normocephalic.     Nose: Nose normal.     Mouth/Throat:     Mouth: Mucous membranes are moist.  Cardiovascular:     Rate and Rhythm: Normal rate.     Pulses: Normal pulses.  Pulmonary:     Effort: Pulmonary effort is normal.  Musculoskeletal:        General: Normal range of motion.     Cervical back: Normal range of motion.  Neurological:     General: No focal deficit present.     Mental Status: He is alert and oriented to person, place, and time.  Psychiatric:        Attention and Perception: Attention and perception normal.        Mood and Affect: Mood and affect normal.        Speech: Speech normal.        Behavior: Behavior normal. Behavior is cooperative.        Thought Content: Thought content includes suicidal ideation. Thought content includes suicidal plan.        Cognition and Memory: Cognition and memory normal.        Judgment: Judgment normal.   Review of Systems  Psychiatric/Behavioral:  Positive for depression, substance abuse and suicidal ideas.   All other systems reviewed and are negative. Blood pressure 128/89, pulse 71, temperature 98.8 F (37.1 C), temperature source Oral, resp. rate 18, height  6\' 1"  (1.854 m), weight 111.1 kg, SpO2 99 %. Body mass index is 32.32 kg/m.  Treatment Plan Summary Medication management and Plan The patient is a safety risk to himself and requires psychiatric inpatient admission for stabilization and treatment.  Disposition: Recommend psychiatric Inpatient admission when medically cleared. Supportive therapy provided about ongoing stressors.  Caroline Sauger, NP 11/01/2020 10:58 PM

## 2020-11-01 NOTE — ED Notes (Signed)
Pt ambulates back to room, does not provide urine sample again. PT is in what he describes as 10/10 pain and is yelling from room. No pain relief from med early and only worsened. Requests Dr. Joni Fears to assess, this nurse sees no deformities. Dr. Joni Fears places VO of robaxin, to administer now.

## 2020-11-01 NOTE — ED Notes (Signed)
Pt reports that he has history of stroke and it has caused him pain in bilateral lower extremities. Requesting pain medication.

## 2020-11-02 ENCOUNTER — Encounter: Payer: Self-pay | Admitting: Psychiatry

## 2020-11-02 ENCOUNTER — Inpatient Hospital Stay
Admission: AD | Admit: 2020-11-02 | Discharge: 2020-11-11 | DRG: 885 | Disposition: A | Discharge status: Home / Self Care | Payer: 59 | Source: Intra-hospital | Attending: Behavioral Health | Admitting: Behavioral Health

## 2020-11-02 DIAGNOSIS — F332 Major depressive disorder, recurrent severe without psychotic features: Secondary | ICD-10-CM

## 2020-11-02 DIAGNOSIS — Z79899 Other long term (current) drug therapy: Secondary | ICD-10-CM | POA: Diagnosis not present

## 2020-11-02 DIAGNOSIS — Z20822 Contact with and (suspected) exposure to covid-19: Secondary | ICD-10-CM | POA: Diagnosis present

## 2020-11-02 DIAGNOSIS — H9213 Otorrhea, bilateral: Secondary | ICD-10-CM | POA: Diagnosis not present

## 2020-11-02 DIAGNOSIS — G4700 Insomnia, unspecified: Secondary | ICD-10-CM | POA: Diagnosis present

## 2020-11-02 DIAGNOSIS — Z7982 Long term (current) use of aspirin: Secondary | ICD-10-CM

## 2020-11-02 DIAGNOSIS — F17210 Nicotine dependence, cigarettes, uncomplicated: Secondary | ICD-10-CM | POA: Diagnosis present

## 2020-11-02 DIAGNOSIS — E785 Hyperlipidemia, unspecified: Secondary | ICD-10-CM | POA: Diagnosis present

## 2020-11-02 DIAGNOSIS — H9210 Otorrhea, unspecified ear: Secondary | ICD-10-CM | POA: Diagnosis present

## 2020-11-02 DIAGNOSIS — Z8673 Personal history of transient ischemic attack (TIA), and cerebral infarction without residual deficits: Secondary | ICD-10-CM

## 2020-11-02 DIAGNOSIS — F1410 Cocaine abuse, uncomplicated: Secondary | ICD-10-CM | POA: Diagnosis present

## 2020-11-02 DIAGNOSIS — K2100 Gastro-esophageal reflux disease with esophagitis, without bleeding: Secondary | ICD-10-CM | POA: Diagnosis present

## 2020-11-02 DIAGNOSIS — I10 Essential (primary) hypertension: Secondary | ICD-10-CM | POA: Diagnosis present

## 2020-11-02 DIAGNOSIS — F1414 Cocaine abuse with cocaine-induced mood disorder: Secondary | ICD-10-CM | POA: Diagnosis present

## 2020-11-02 DIAGNOSIS — H9212 Otorrhea, left ear: Secondary | ICD-10-CM | POA: Diagnosis not present

## 2020-11-02 DIAGNOSIS — F1994 Other psychoactive substance use, unspecified with psychoactive substance-induced mood disorder: Secondary | ICD-10-CM | POA: Diagnosis present

## 2020-11-02 DIAGNOSIS — R45850 Homicidal ideations: Secondary | ICD-10-CM | POA: Diagnosis present

## 2020-11-02 DIAGNOSIS — R45851 Suicidal ideations: Secondary | ICD-10-CM | POA: Diagnosis present

## 2020-11-02 LAB — URINE DRUG SCREEN, QUALITATIVE (ARMC ONLY)
Amphetamines, Ur Screen: NOT DETECTED
Barbiturates, Ur Screen: NOT DETECTED
Benzodiazepine, Ur Scrn: NOT DETECTED
Cannabinoid 50 Ng, Ur ~~LOC~~: NOT DETECTED
Cocaine Metabolite,Ur ~~LOC~~: POSITIVE — AB
MDMA (Ecstasy)Ur Screen: NOT DETECTED
Methadone Scn, Ur: NOT DETECTED
Opiate, Ur Screen: NOT DETECTED
Phencyclidine (PCP) Ur S: NOT DETECTED
Tricyclic, Ur Screen: NOT DETECTED

## 2020-11-02 LAB — SARS CORONAVIRUS 2 (TAT 6-24 HRS): SARS Coronavirus 2: NEGATIVE

## 2020-11-02 MED ORDER — FAMOTIDINE 20 MG PO TABS
20.0000 mg | ORAL_TABLET | Freq: Two times a day (BID) | ORAL | Status: DC
  Administered 2020-11-02 – 2020-11-03 (×2): 20 mg via ORAL
  Filled 2020-11-02 (×2): qty 1

## 2020-11-02 MED ORDER — ACETAMINOPHEN 325 MG PO TABS
650.0000 mg | ORAL_TABLET | Freq: Four times a day (QID) | ORAL | Status: DC | PRN
  Filled 2020-11-02: qty 2

## 2020-11-02 MED ORDER — GABAPENTIN 400 MG PO CAPS
400.0000 mg | ORAL_CAPSULE | Freq: Three times a day (TID) | ORAL | Status: DC
  Administered 2020-11-03 – 2020-11-11 (×25): 400 mg via ORAL
  Filled 2020-11-02 (×25): qty 1

## 2020-11-02 MED ORDER — QUETIAPINE FUMARATE 100 MG PO TABS
100.0000 mg | ORAL_TABLET | Freq: Every day | ORAL | Status: DC
  Administered 2020-11-02: 100 mg via ORAL
  Filled 2020-11-02: qty 1

## 2020-11-02 MED ORDER — ATORVASTATIN CALCIUM 20 MG PO TABS
80.0000 mg | ORAL_TABLET | Freq: Every evening | ORAL | Status: DC
  Administered 2020-11-02 – 2020-11-10 (×9): 80 mg via ORAL
  Filled 2020-11-02 (×9): qty 4

## 2020-11-02 MED ORDER — ALUM & MAG HYDROXIDE-SIMETH 200-200-20 MG/5ML PO SUSP: 30.0000 mL | ORAL | Status: DC | PRN

## 2020-11-02 MED ORDER — MAGNESIUM HYDROXIDE 400 MG/5ML PO SUSP: 30.0000 mL | Freq: Every day | ORAL | Status: DC | PRN

## 2020-11-02 MED ORDER — ASPIRIN EC 81 MG PO TBEC
81.0000 mg | DELAYED_RELEASE_TABLET | Freq: Every day | ORAL | Status: DC
  Administered 2020-11-03 – 2020-11-11 (×9): 81 mg via ORAL
  Filled 2020-11-02 (×9): qty 1

## 2020-11-02 MED ORDER — ALUM & MAG HYDROXIDE-SIMETH 200-200-20 MG/5ML PO SUSP: 30.0000 mL | Freq: Four times a day (QID) | ORAL | Status: DC | PRN

## 2020-11-02 MED ORDER — DICLOFENAC SODIUM 1 % EX GEL
4.0000 g | Freq: Four times a day (QID) | CUTANEOUS | Status: DC
  Administered 2020-11-02 – 2020-11-09 (×6): 4 g via TOPICAL
  Filled 2020-11-02: qty 100

## 2020-11-02 MED ORDER — PANTOPRAZOLE SODIUM 40 MG PO TBEC
40.0000 mg | DELAYED_RELEASE_TABLET | Freq: Every day | ORAL | Status: DC
  Administered 2020-11-03: 40 mg via ORAL
  Filled 2020-11-02: qty 1

## 2020-11-02 MED ORDER — LISINOPRIL 5 MG PO TABS
5.0000 mg | ORAL_TABLET | Freq: Every day | ORAL | Status: DC
  Administered 2020-11-03 – 2020-11-04 (×2): 5 mg via ORAL
  Filled 2020-11-02 (×2): qty 1

## 2020-11-02 NOTE — ED Notes (Signed)
Hourly rounding performed, patient currently asleep in room. Patient has no complaints at this time. Q15 minute rounds and monitoring via Rover and Officer to continue. 

## 2020-11-02 NOTE — ED Provider Notes (Signed)
Emergency Medicine Observation Re-evaluation Note  Samuel Molina is a 60 y.o. male, seen on rounds today.  Pt initially presented to the ED for complaints of Suicide Attempt Currently, the patient is calm cooperative no complaints.  Physical Exam  BP 139/69 (BP Location: Right Arm)   Pulse (!) 57   Temp 98 F (36.7 C) (Oral)   Resp 20   Ht 6\' 1"  (1.854 m)   Wt 111.1 kg   SpO2 98%   BMI 32.32 kg/m  Physical Exam General: Awake alert. Cardiac: Regular rate and rhythm around 60 bpm. Lungs: Clear to auscultation bilaterally without wheeze rales or rhonchi Psych: Calm cooperative and pleasant  ED Course / MDM  Lab work performed yesterday shows no acute abnormalities. Plan  Current plan is for admission to the psychiatric unit once a bed is available. Patient is under full IVC at this time.   Harvest Dark, MD 11/02/20 1527

## 2020-11-02 NOTE — ED Notes (Signed)
Provided patient drink, snack, blanket, TV remote.

## 2020-11-02 NOTE — ED Notes (Signed)
Pt given breakfast tray

## 2020-11-02 NOTE — ED Notes (Signed)
Pt given meal tray.

## 2020-11-02 NOTE — ED Notes (Signed)
Report received from [place], Conservation officer, nature. Patient alert and oriented, warm and dry, in no acute distress. Patient denies HI, AVH and pain. Patient made aware of Q15 minute rounds and security cameras for their safety. Patient instructed to come to this nurse with needs or concerns.

## 2020-11-02 NOTE — ED Notes (Signed)
Hourly rounding performed, patient currently awake in room. Patient has no complaints at this time. Q15 minute rounds and monitoring via [place] and Officer to continue. 

## 2020-11-02 NOTE — ED Notes (Signed)
IVC, pend BMU palcement

## 2020-11-02 NOTE — ED Notes (Signed)
Pt in shower.  

## 2020-11-02 NOTE — ED Notes (Signed)
Hourly rounding performed, patient currently asleep in room. Patient has no complaints at this time. Q15 minute rounds and monitoring via [employer] to continue.

## 2020-11-02 NOTE — BH Assessment (Signed)
Patient is to be admitted to [facility] by [name].  Attending Physician will be Dr. Domingo Cocking.   Patient has been assigned to room 323, by   Intake Paper Work has been signed and placed on patient chart.  ER staff is aware of the admission: Vaughan Basta, ER Secretary   Dr. Quentin Cornwall, ER MD  Gerald Stabs, Patient's Nurse  Helene Kelp, Patient Access.

## 2020-11-02 NOTE — ED Notes (Signed)
Pt to nurse desk and is agitated with nurse due to not receiving pain medication as received last night. Pt has not expressed that he was in pain tonight and is elevating voice at this time. Nurse educates pt that these medications are ordered as one time orders and they are not a recurrent medication. Pt then states that his legs started to cramp as talking to nurse and asks for nurse to reach out to MD. Dr. Quentin Cornwall sent secure chat at this time of situation and request. Pt back to room

## 2020-11-02 NOTE — ED Notes (Signed)
Report called to [place], RN in BMU. States current situation is occurring and pt can come after it is handled. Will follow up and get pt admitted as soon as possible.

## 2020-11-02 NOTE — ED Notes (Signed)
Dr. Quentin Cornwall states that no further orders to be placed at this time.

## 2020-11-02 NOTE — ED Notes (Signed)
Given dinner

## 2020-11-02 NOTE — ED Notes (Signed)
Will obtain VS when pt awakens.

## 2020-11-02 NOTE — Consult Note (Signed)
[facility] Face-to-Face Psychiatry Consult   Reason for Consult:   Consult for this 60 year old man with a history of mood symptoms behavior problems and cocaine abuse who came to the emergency room stating he was suicidal Referring Physician: Paduchowski Patient Identification: Samuel Molina MRN:  [id number] Principal Diagnosis: Severe recurrent major depression without psychotic features ([place]) Diagnosis:  Principal Problem:   Severe recurrent major depression without psychotic features ([place]) Active Problems:   Major depression   Cocaine abuse ([place])   Substance induced mood disorder ([place])   Personality disorder ([place])   Malingering   TIA (transient ischemic attack)   Stroke ([place])   Depression   Tobacco abuse   Cocaine use disorder, severe, dependence ([place])   Total Time spent with patient: 30 minutes  Subjective:   Samuel Molina is a 60 y.o. male patient admitted with "it has been real bad".  HPI: Patient seen chart reviewed.  Patient known from previous encounters.  60 year old man with a history of cocaine abuse and mood and anxiety symptoms with chronic behavior problems presented to the emergency room stating that he was suicidal.  Patient has claimed that he had held a gun to his head a couple days ago but his family took it away from him.  He says his mood is very bad and he needs help.  As has been the case often in the past the patient is limited as a historian because of his reluctance to answer many questions often brushing off details that he thinks are unimportant.  He does acknowledge that he is continuing to use cocaine regularly.  Continues to say that he is angry at his family because he thinks they are "making fun" of him.  Patient is fixated still on the idea that he is having strokes frequently.  This has been investigated several times and I am not sure there is much evidence that he has had any true strokes and certainly not as much as what he claims but he is very  fixated on it.  Past Psychiatric History: Past history of substance abuse.  Patient has frequently reported acute distress and demanded treatment only to refuse or sabotage treatment once it was arranged for him.  Tends to be very narcissistic and insists that only he knows how to manage things for himself but at the same time is not taking much responsibility.  Has been on medication with unclear benefit.  Frequent threats of suicide probably much more frequent than any actual intent to act on it.  Risk to Self:   Risk to Others:   Prior Inpatient Therapy:   Prior Outpatient Therapy:    Past Medical History:  Past Medical History:  Diagnosis Date   Anxiety    Depression    GERD (gastroesophageal reflux disease)    Obesity    Stroke ([place])    Substance abuse ([place])     Past Surgical History:  Procedure Laterality Date   ESOPHAGOGASTRODUODENOSCOPY (EGD) WITH PROPOFOL N/A 09/20/2017   Procedure: ESOPHAGOGASTRODUODENOSCOPY (EGD) WITH PROPOFOL;  Surgeon: Lin Landsman, MD;  Location: [facility] ENDOSCOPY;  Service: Gastroenterology;  Laterality: N/A;   KNEE SURGERY Right    Family History:  Family History  Problem Relation Age of Onset   Vasculitis Mother    Family Psychiatric  History: See previous Social History:  Social History   Substance and Sexual Activity  Alcohol Use Not Currently     Social History   Substance and Sexual Activity  Drug Use Yes  Types: Cocaine    Social History   Socioeconomic History   Marital status: Single    Spouse name: Not on file   Number of children: Not on file   Years of education: Not on file   Highest education level: Not on file  Occupational History   Not on file  Tobacco Use   Smoking status: Every Day    Packs/day: 0.50    Pack years: 0.00    Types: Cigarettes   Smokeless tobacco: Never  Vaping Use   Vaping Use: Never used  Substance and Sexual Activity   Alcohol use: Not Currently   Drug use: Yes    Types: Cocaine    Sexual activity: Not on file  Other Topics Concern   Not on file  Social History Narrative   Not on file   Social Determinants of Health   Financial Resource Strain: Not on file  Food Insecurity: Not on file  Transportation Needs: Not on file  Physical Activity: Not on file  Stress: Not on file  Social Connections: Not on file   Additional Social History:    Allergies:   Allergies  Allergen Reactions   Acetaminophen Itching, Nausea And Vomiting and Rash    Labs:  Results for orders placed or performed during the hospital encounter of 11/01/20 (from the past 48 hour(s))  Comprehensive metabolic panel     Status: Abnormal   Collection Time: 11/01/20  2:20 PM  Result Value Ref Range   Sodium 136 135 - 145 mmol/L   Potassium 4.0 3.5 - 5.1 mmol/L   Chloride 103 98 - 111 mmol/L   CO2 24 22 - 32 mmol/L   Glucose, Bld 156 (H) 70 - 99 mg/dL    Comment: Glucose reference range applies only to samples taken after fasting for at least 8 hours.   BUN 21 (H) 6 - 20 mg/dL   Creatinine, Ser 1.08 0.61 - 1.24 mg/dL   Calcium 9.3 8.9 - 10.3 mg/dL   Total Protein 8.4 (H) 6.5 - 8.1 g/dL   Albumin 4.6 3.5 - 5.0 g/dL   AST 28 15 - 41 U/L   ALT 17 0 - 44 U/L   Alkaline Phosphatase 98 38 - 126 U/L   Total Bilirubin 1.2 0.3 - 1.2 mg/dL   GFR, Estimated >60 >60 mL/min    Comment: (NOTE) Calculated using the CKD-EPI Creatinine Equation (2021)    Anion gap 9 5 - 15    Comment: Performed at [facility], [address] [place], [place] [address]  Ethanol     Status: None   Collection Time: 11/01/20  2:20 PM  Result Value Ref Range   Alcohol, Ethyl (B) <10 <10 mg/dL    Comment: (NOTE) Lowest detectable limit for serum alcohol is 10 mg/dL.  For medical purposes only. Performed at [facility], [address] [place], [place] [address]   Salicylate level     Status: Abnormal   Collection Time: 11/01/20  2:20 PM  Result Value Ref Range   Salicylate Lvl <1.3 (L) 7.0  - 30.0 mg/dL    Comment: Performed at [facility], [address] [place], [place] [address]  Acetaminophen level     Status: Abnormal   Collection Time: 11/01/20  2:20 PM  Result Value Ref Range   Acetaminophen (Tylenol), Serum <10 (L) 10 - 30 ug/mL    Comment: (NOTE) Therapeutic concentrations vary significantly. A range of 10-30 ug/mL  may be an effective concentration  for many patients. However, some  are best treated at concentrations outside of this range. Acetaminophen concentrations >150 ug/mL at 4 hours after ingestion  and >50 ug/mL at 12 hours after ingestion are often associated with  toxic reactions.  Performed at [facility], [address] [place], [place] [address]   cbc     Status: None   Collection Time: 11/01/20  2:20 PM  Result Value Ref Range   WBC 7.5 4.0 - 10.5 K/uL   RBC 5.77 4.22 - 5.81 MIL/uL   Hemoglobin 15.3 13.0 - 17.0 g/dL   HCT 46.6 39.0 - 52.0 %   MCV 80.8 80.0 - 100.0 fL   MCH 26.5 26.0 - 34.0 pg   MCHC 32.8 30.0 - 36.0 g/dL   RDW 15.4 11.5 - 15.5 %   Platelets 320 150 - 400 K/uL   nRBC 0.0 0.0 - 0.2 %    Comment: Performed at [facility], [address] [place], [place] [address]  SARS CORONAVIRUS 2 (TAT 6-24 HRS) Nasopharyngeal Nasopharyngeal Swab     Status: None   Collection Time: 11/01/20  5:17 PM   Specimen: Nasopharyngeal Swab  Result Value Ref Range   SARS Coronavirus 2 NEGATIVE NEGATIVE    Comment: (NOTE) SARS-CoV-2 target nucleic acids are NOT DETECTED.  The SARS-CoV-2 RNA is generally detectable in upper and lower respiratory specimens during the acute phase of infection. Negative results do not preclude SARS-CoV-2 infection, do not rule out co-infections with other pathogens, and should not be used as the sole basis for treatment or other patient management decisions. Negative results must be combined with clinical observations, patient history, and epidemiological information. The  expected result is Negative.  Fact Sheet for Patients: SugarRoll.be  Fact Sheet for Healthcare Providers: https://www.woods-mathews.com/  This test is not yet approved or cleared by the [place] FDA and  has been authorized for detection and/or diagnosis of SARS-CoV-2 by FDA under an Emergency Use Authorization (EUA). This EUA will remain  in effect (meaning this test can be used) for the duration of the COVID-19 declaration under Se ction 564(b)(1) of the Act, 21 U.S.C. section 360bbb-3(b)(1), unless the authorization is terminated or revoked sooner.  Performed at [facility], [place] [address] [place], [place] [address]   Urine Drug Screen, Qualitative     Status: Abnormal   Collection Time: 11/01/20 11:32 PM  Result Value Ref Range   Tricyclic, Ur Screen NONE DETECTED NONE DETECTED   Amphetamines, Ur Screen NONE DETECTED NONE DETECTED   MDMA (Ecstasy)Ur Screen NONE DETECTED NONE DETECTED   Cocaine Metabolite,Ur [place] POSITIVE (A) NONE DETECTED   Opiate, Ur Screen NONE DETECTED NONE DETECTED   Phencyclidine (PCP) Ur S NONE DETECTED NONE DETECTED   Cannabinoid 50 Ng, Ur [place] NONE DETECTED NONE DETECTED   Barbiturates, Ur Screen NONE DETECTED NONE DETECTED   Benzodiazepine, Ur Scrn NONE DETECTED NONE DETECTED   Methadone Scn, Ur NONE DETECTED NONE DETECTED    Comment: (NOTE) Tricyclics + metabolites, urine    Cutoff 1000 ng/mL Amphetamines + metabolites, urine  Cutoff 1000 ng/mL MDMA (Ecstasy), urine              Cutoff 500 ng/mL Cocaine Metabolite, urine          Cutoff 300 ng/mL Opiate + metabolites, urine        Cutoff 300 ng/mL Phencyclidine (PCP), urine         Cutoff 25 ng/mL Cannabinoid, urine  Cutoff 50 ng/mL Barbiturates + metabolites, urine  Cutoff 200 ng/mL Benzodiazepine, urine              Cutoff 200 ng/mL Methadone, urine                   Cutoff 300 ng/mL  The urine drug screen provides only a  preliminary, unconfirmed analytical test result and should not be used for non-medical purposes. Clinical consideration and professional judgment should be applied to any positive drug screen result due to possible interfering substances. A more specific alternate chemical method must be used in order to obtain a confirmed analytical result. Gas chromatography / mass spectrometry (GC/MS) is the preferred confirm atory method. Performed at [facility], [address] [place], [place] [address]     No current facility-administered medications for this encounter.   Current Outpatient Medications  Medication Sig Dispense Refill   aspirin EC 81 MG tablet Take 81 mg by mouth daily. Swallow whole.     atorvastatin (LIPITOR) 80 MG tablet Take 80 mg by mouth every evening.     citalopram (CELEXA) 20 MG tablet Take 1 tablet (20 mg total) by mouth daily. 30 tablet 0   diclofenac Sodium (VOLTAREN) 1 % GEL Apply 4 g topically 4 (four) times daily.     famotidine (PEPCID) 20 MG tablet Take 20 mg by mouth 2 (two) times daily.     gabapentin (NEURONTIN) 400 MG capsule Take 400 mg by mouth 3 (three) times daily.     lidocaine (LIDODERM) 5 % Place 1 patch onto the skin daily. Remove & Discard patch within 12 hours or as directed by MD     lisinopril (ZESTRIL) 5 MG tablet Take 1 tablet (5 mg total) by mouth daily. 30 tablet 0   pantoprazole (PROTONIX) 40 MG tablet Take 1 tablet (40 mg total) by mouth daily. 30 tablet 1   QUEtiapine (SEROQUEL) 100 MG tablet Take 100 mg by mouth at bedtime.     QUEtiapine (SEROQUEL) 300 MG tablet TAKE 1 TABLET BY MOUTH AT BEDTIME 7 tablet 0   Riboflavin 100 MG CAPS Take 100 mg by mouth daily.     tiZANidine (ZANAFLEX) 2 MG tablet Take 2 mg by mouth every 8 (eight) hours as needed for muscle spasms.      Musculoskeletal: Strength & Muscle Tone: within normal limits Gait & Station: normal Patient leans: N/A            Psychiatric Specialty  Exam:  Presentation  General Appearance: Appropriate for Environment; Casual  Eye Contact:Good  Speech:Clear and Coherent; Normal Rate  Speech Volume:Normal  Handedness:Right   Mood and Affect  Mood:Euthymic  Affect:Appropriate; Congruent   Thought Process  Thought Processes:Coherent  Descriptions of Associations:Intact  Orientation:Full (Time, Place and Person)  Thought Content:Logical; WDL  History of Schizophrenia/Schizoaffective disorder:No  Duration of Psychotic Symptoms:Less than six months  Hallucinations:Hallucinations: None  Ideas of Reference:None  Suicidal Thoughts:Suicidal Thoughts: Yes, Passive SI Passive Intent and/or Plan: With Intent; With Plan  Homicidal Thoughts:Homicidal Thoughts: No   Sensorium  Memory:Immediate Good; Recent Good; Remote Good  Judgment:Intact  Insight:Fair   Executive Functions  Concentration:Good  Attention Span:Good  [place] of Knowledge:Good  Language:Good   Psychomotor Activity  Psychomotor Activity:Psychomotor Activity: Normal   Assets  Assets:Communication Skills; Desire for Improvement   Sleep  Sleep:Sleep: Good   Physical Exam: Physical Exam Vitals and nursing note reviewed.  Constitutional:      Appearance: Normal appearance.  HENT:     Head: Normocephalic and atraumatic.     Mouth/Throat:     Pharynx: Oropharynx is clear.  Eyes:     Pupils: Pupils are equal, round, and reactive to light.  Cardiovascular:     Rate and Rhythm: Normal rate and regular rhythm.  Pulmonary:     Effort: Pulmonary effort is normal.     Breath sounds: Normal breath sounds.  Abdominal:     General: Abdomen is flat.     Palpations: Abdomen is soft.  Musculoskeletal:        General: Normal range of motion.  Skin:    General: Skin is warm and dry.  Neurological:     General: No focal deficit present.     Mental Status: He is alert. Mental status is at baseline.  Psychiatric:         Attention and Perception: He is inattentive.        Mood and Affect: Mood is depressed.        Speech: Speech is delayed.        Behavior: Behavior is slowed.        Thought Content: Thought content includes suicidal ideation. Thought content includes suicidal plan.   Review of Systems  Constitutional: Negative.   HENT: Negative.    Eyes: Negative.   Respiratory: Negative.    Cardiovascular: Negative.   Gastrointestinal: Negative.   Musculoskeletal: Negative.   Skin: Negative.   Neurological:  Positive for focal weakness.  Psychiatric/Behavioral:  Positive for depression, substance abuse and suicidal ideas.   Blood pressure 139/69, pulse (!) 57, temperature 98 F (36.7 C), temperature source Oral, resp. rate 20, height '6\' 1"'  (1.854 m), weight 111.1 kg, SpO2 98 %. Body mass index is 32.32 kg/m.  Treatment Plan Summary: Plan patient was seen by nurse practitioner last night who believed the patient met criteria for inpatient hospitalization.  Patient is continuing to insist that he is suicidal.  In the past the patient has frequently made this statement without any evidence of actually planning to carry through on it and often with what seemed to be clear intention to manipulate circumstances.  Nevertheless he certainly seems to be in a bad mood and feeling desperate.  Case reviewed with ER physician and inpatient team.  Patient will be admitted to the inpatient psychiatric ward.  Orders reviewed.  Labs reviewed.  Disposition: Recommend psychiatric Inpatient admission when medically cleared.  Alethia Berthold, MD 11/02/2020 2:48 PM

## 2020-11-02 NOTE — ED Notes (Signed)
Pt provided with icey and ginger ale, returns to room

## 2020-11-02 NOTE — ED Notes (Signed)
Pt asleep at this time, unable to collect vitals. Will collect pt vitals once awake. 

## 2020-11-03 ENCOUNTER — Encounter: Payer: Self-pay | Admitting: Psychiatry

## 2020-11-03 ENCOUNTER — Other Ambulatory Visit: Payer: Self-pay

## 2020-11-03 DIAGNOSIS — F332 Major depressive disorder, recurrent severe without psychotic features: Principal | ICD-10-CM

## 2020-11-03 MED ORDER — CYCLOBENZAPRINE HCL 10 MG PO TABS
5.0000 mg | ORAL_TABLET | Freq: Every day | ORAL | Status: DC
  Administered 2020-11-03 – 2020-11-10 (×8): 5 mg via ORAL
  Filled 2020-11-03 (×8): qty 1

## 2020-11-03 MED ORDER — CITALOPRAM HYDROBROMIDE 20 MG PO TABS
10.0000 mg | ORAL_TABLET | Freq: Every day | ORAL | Status: DC
  Administered 2020-11-03 – 2020-11-11 (×9): 10 mg via ORAL
  Filled 2020-11-03 (×9): qty 1

## 2020-11-03 MED ORDER — PANTOPRAZOLE SODIUM 40 MG PO TBEC
40.0000 mg | DELAYED_RELEASE_TABLET | Freq: Two times a day (BID) | ORAL | Status: DC
  Administered 2020-11-03 – 2020-11-11 (×16): 40 mg via ORAL
  Filled 2020-11-03 (×16): qty 1

## 2020-11-03 MED ORDER — QUETIAPINE FUMARATE 200 MG PO TABS
200.0000 mg | ORAL_TABLET | Freq: Every day | ORAL | Status: DC
  Administered 2020-11-03 – 2020-11-06 (×4): 200 mg via ORAL
  Filled 2020-11-03 (×4): qty 1

## 2020-11-03 MED ORDER — MELOXICAM 7.5 MG PO TABS
7.5000 mg | ORAL_TABLET | Freq: Every day | ORAL | Status: DC
  Administered 2020-11-03 – 2020-11-11 (×9): 7.5 mg via ORAL
  Filled 2020-11-03 (×9): qty 1

## 2020-11-03 NOTE — [facility] Group Notes (Signed)
LCSW Group Therapy Note  11/03/2020 12:41 PM  Type of Therapy/Topic:  Group Therapy:  Emotion Regulation  Participation Level:  Did Not Attend   Description of Group:   The purpose of this group is to assist patients in learning to regulate negative emotions and experience positive emotions. Patients will be guided to discuss ways in which they have been vulnerable to their negative emotions. These vulnerabilities will be juxtaposed with experiences of positive emotions or situations, and patients will be challenged to use positive emotions to combat negative ones. Special emphasis will be placed on coping with negative emotions in conflict situations, and patients will process healthy conflict resolution skills.  Therapeutic Goals: Patient will identify two positive emotions or experiences to reflect on in order to balance out negative emotions Patient will label two or more emotions that they find the most difficult to experience Patient will demonstrate positive conflict resolution skills through discussion and/or role plays  Summary of Patient Progress:       Therapeutic Modalities:   Cognitive Behavioral Therapy Feelings Identification [place], MSW, LCSW 11/03/2020 12:41 PM

## 2020-11-03 NOTE — H&P (Signed)
Psychiatric Admission Assessment Adult  Patient Identification: Samuel Molina MRN:  [id number] Date of Evaluation:  11/03/2020 Chief Complaint:  Severe recurrent major depression without psychotic features ([place]) [F33.2] Principal Diagnosis: Severe recurrent major depression without psychotic features ([place]) Diagnosis:  Principal Problem:   Severe recurrent major depression without psychotic features ([place]) Active Problems:   Gastroesophageal reflux disease with esophagitis   Cocaine abuse ([place])   Substance induced mood disorder (HCC)   HTN (hypertension)   HLD (hyperlipidemia)  CC "Need to work on my anger."  History of Present Illness: 60 year old male with history of mood problems and substance abuse presenting for suicidal ideations and homicidal ideations in the context of relapse.  Patient notes that he has been off of his medications for quite some time and undergoing a plethora of medical problems.  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  He says this had him feeling very depressed and self-medicating with cocaine.  Since then his mood has been gradually worse and he is experienced a lot more anger.  He alternates from feeling suicidal and homicidal and continues to have these thoughts today though contracts for safety.  He denies any auditory or visual hallucinations.  He also continues to complain of hiccups acid reflux and vomiting.  He wants gastroenterology to do an endoscopy during admission.  He gets very angry when told that this will likely have to be done on an outpatient basis.  She is also complaining of leg pain and cramping from his knees running to the front of his shins.  He is agreeable to restarting Seroquel 200 mg and Celexa 10 mg for mood.  He is also agreeable to Mobic for pain and Protonix for acid reflux.  Patient also notes that he supposed to be on a  blood thinner.  Secure chat sent to Dr. Alice Reichert from gastroenterology.  He recommends ensuring compliance with Protonix prior to changing regimen.  He states we could start Dexilant 60 mg daily if this medication fails.  Recommends outpatient follow-up with GI.  Notes of chart review completed looking at our records in addition to the records from [facility].  Patient has had numerous ED visits for complaints of left-sided weakness.  A full medical work-up was done at [facility] including CT, CTA, MRI, and lumbar puncture.  His work-up was negative for stroke.  He was not a tPA candidate and was never started on anticoagulants.  He was diagnosed alternatively with functional neurologic disorder, factitious disorder, or malingering.  Will not start blood thinner at this time. Associated Signs/Symptoms: Depression Symptoms:  depressed mood, insomnia, psychomotor agitation, difficulty concentrating, hopelessness, suicidal thoughts with specific plan, Duration of Depression Symptoms: Less than two weeks  (Hypo) Manic Symptoms:  Impulsivity, Irritable Mood, Anxiety Symptoms:  Excessive Worry, Psychotic Symptoms:   Denies PTSD Symptoms: Negative Total Time spent with patient: 1 hour  Past Psychiatric History: History of mood disorder and substance abuse.  Patient has multiple prior admissions for which he typically has multiple demands.  However he tends to sabotage his treatment after its arranged.  So far he has not actually followed up with any outpatient appointments or residential substance abuse treatments created for him.  He is also not remain on his medications at discharge.  Multiple threats of suicide in the past.  Is the patient at risk to self? Yes.    Has  the patient been a risk to self in the past 6 months? Yes.    Has the patient been a risk to self within the distant past? Yes.    Is the patient a risk to others? Yes.    Has the patient been a risk to others in the past 6 months?  Yes.    Has the patient been a risk to others within the distant past? No.   Prior Inpatient Therapy:   Prior Outpatient Therapy:    Alcohol Screening: 1. How often do you have a drink containing alcohol?: Never 2. How many drinks containing alcohol do you have on a typical day when you are drinking?: 1 or 2 3. How often do you have six or more drinks on one occasion?: Never AUDIT-C Score: 0 4. How often during the last year have you found that you were not able to stop drinking once you had started?: Never 5. How often during the last year have you failed to do what was normally expected from you because of drinking?: Never 6. How often during the last year have you needed a first drink in the morning to get yourself going after a heavy drinking session?: Never 7. How often during the last year have you had a feeling of guilt of remorse after drinking?: Never 8. How often during the last year have you been unable to remember what happened the night before because you had been drinking?: Never 9. Have you or someone else been injured as a result of your drinking?: No 10. Has a relative or friend or a doctor or another health worker been concerned about your drinking or suggested you cut down?: No Alcohol Use Disorder Identification Test Final Score (AUDIT): 0 Substance Abuse History in the last 12 months:  Yes.   Consequences of Substance Abuse: Worsening mental health and suicidal ideations Previous Psychotropic Medications: Yes  Psychological Evaluations: Yes  Past Medical History:  Past Medical History:  Diagnosis Date   Anxiety    Depression    GERD (gastroesophageal reflux disease)    Obesity    Stroke ([place])    Substance abuse ([place])     Past Surgical History:  Procedure Laterality Date   ESOPHAGOGASTRODUODENOSCOPY (EGD) WITH PROPOFOL N/A 09/20/2017   Procedure: ESOPHAGOGASTRODUODENOSCOPY (EGD) WITH PROPOFOL;  Surgeon: Lin Landsman, MD;  Location: [facility] ENDOSCOPY;   Service: Gastroenterology;  Laterality: N/A;   KNEE SURGERY Right    Family History:  Family History  Problem Relation Age of Onset   Vasculitis Mother    Family Psychiatric  History: Denies Tobacco Screening:   Social History:  Social History   Substance and Sexual Activity  Alcohol Use Not Currently     Social History   Substance and Sexual Activity  Drug Use Yes   Types: Cocaine    Additional Social History:                           Allergies:   Allergies  Allergen Reactions   Acetaminophen Itching, Nausea And Vomiting and Rash   Lab Results:  Results for orders placed or performed during the hospital encounter of 11/01/20 (from the past 48 hour(s))  Comprehensive metabolic panel     Status: Abnormal   Collection Time: 11/01/20  2:20 PM  Result Value Ref Range   Sodium 136 135 - 145 mmol/L   Potassium 4.0 3.5 - 5.1 mmol/L   Chloride 103 98 - 111 mmol/L  CO2 24 22 - 32 mmol/L   Glucose, Bld 156 (H) 70 - 99 mg/dL    Comment: Glucose reference range applies only to samples taken after fasting for at least 8 hours.   BUN 21 (H) 6 - 20 mg/dL   Creatinine, Ser 1.08 0.61 - 1.24 mg/dL   Calcium 9.3 8.9 - 10.3 mg/dL   Total Protein 8.4 (H) 6.5 - 8.1 g/dL   Albumin 4.6 3.5 - 5.0 g/dL   AST 28 15 - 41 U/L   ALT 17 0 - 44 U/L   Alkaline Phosphatase 98 38 - 126 U/L   Total Bilirubin 1.2 0.3 - 1.2 mg/dL   GFR, Estimated >60 >60 mL/min    Comment: (NOTE) Calculated using the CKD-EPI Creatinine Equation (2021)    Anion gap 9 5 - 15    Comment: Performed at [facility], [address] [place], [place] [address]  Ethanol     Status: None   Collection Time: 11/01/20  2:20 PM  Result Value Ref Range   Alcohol, Ethyl (B) <10 <10 mg/dL    Comment: (NOTE) Lowest detectable limit for serum alcohol is 10 mg/dL.  For medical purposes only. Performed at [facility], [address] [place], [place] [address]   Salicylate level     Status:  Abnormal   Collection Time: 11/01/20  2:20 PM  Result Value Ref Range   Salicylate Lvl <2.9 (L) 7.0 - 30.0 mg/dL    Comment: Performed at [facility], [address] [place], [place] [address]  Acetaminophen level     Status: Abnormal   Collection Time: 11/01/20  2:20 PM  Result Value Ref Range   Acetaminophen (Tylenol), Serum <10 (L) 10 - 30 ug/mL    Comment: (NOTE) Therapeutic concentrations vary significantly. A range of 10-30 ug/mL  may be an effective concentration for many patients. However, some  are best treated at concentrations outside of this range. Acetaminophen concentrations >150 ug/mL at 4 hours after ingestion  and >50 ug/mL at 12 hours after ingestion are often associated with  toxic reactions.  Performed at [facility], [address] [place], [place] [address]   cbc     Status: None   Collection Time: 11/01/20  2:20 PM  Result Value Ref Range   WBC 7.5 4.0 - 10.5 K/uL   RBC 5.77 4.22 - 5.81 MIL/uL   Hemoglobin 15.3 13.0 - 17.0 g/dL   HCT 46.6 39.0 - 52.0 %   MCV 80.8 80.0 - 100.0 fL   MCH 26.5 26.0 - 34.0 pg   MCHC 32.8 30.0 - 36.0 g/dL   RDW 15.4 11.5 - 15.5 %   Platelets 320 150 - 400 K/uL   nRBC 0.0 0.0 - 0.2 %    Comment: Performed at [facility], [address] [place], [place] [address]  SARS CORONAVIRUS 2 (TAT 6-24 HRS) Nasopharyngeal Nasopharyngeal Swab     Status: None   Collection Time: 11/01/20  5:17 PM   Specimen: Nasopharyngeal Swab  Result Value Ref Range   SARS Coronavirus 2 NEGATIVE NEGATIVE    Comment: (NOTE) SARS-CoV-2 target nucleic acids are NOT DETECTED.  The SARS-CoV-2 RNA is generally detectable in upper and lower respiratory specimens during the acute phase of infection. Negative results do not preclude SARS-CoV-2 infection, do not rule out co-infections with other pathogens, and should not be used as the sole basis for treatment or other patient management decisions. Negative results must be  combined  with clinical observations, patient history, and epidemiological information. The expected result is Negative.  Fact Sheet for Patients: SugarRoll.be  Fact Sheet for Healthcare Providers: https://www.woods-mathews.com/  This test is not yet approved or cleared by the [place] FDA and  has been authorized for detection and/or diagnosis of SARS-CoV-2 by FDA under an Emergency Use Authorization (EUA). This EUA will remain  in effect (meaning this test can be used) for the duration of the COVID-19 declaration under Se ction 564(b)(1) of the Act, 21 U.S.C. section 360bbb-3(b)(1), unless the authorization is terminated or revoked sooner.  Performed at [facility], [address] [place], [place] [address]   Urine Drug Screen, Qualitative     Status: Abnormal   Collection Time: 11/01/20 11:32 PM  Result Value Ref Range   Tricyclic, Ur Screen NONE DETECTED NONE DETECTED   Amphetamines, Ur Screen NONE DETECTED NONE DETECTED   MDMA (Ecstasy)Ur Screen NONE DETECTED NONE DETECTED   Cocaine Metabolite,Ur [place] POSITIVE (A) NONE DETECTED   Opiate, Ur Screen NONE DETECTED NONE DETECTED   Phencyclidine (PCP) Ur S NONE DETECTED NONE DETECTED   Cannabinoid 50 Ng, Ur [place] NONE DETECTED NONE DETECTED   Barbiturates, Ur Screen NONE DETECTED NONE DETECTED   Benzodiazepine, Ur Scrn NONE DETECTED NONE DETECTED   Methadone Scn, Ur NONE DETECTED NONE DETECTED    Comment: (NOTE) Tricyclics + metabolites, urine    Cutoff 1000 ng/mL Amphetamines + metabolites, urine  Cutoff 1000 ng/mL MDMA (Ecstasy), urine              Cutoff 500 ng/mL Cocaine Metabolite, urine          Cutoff 300 ng/mL Opiate + metabolites, urine        Cutoff 300 ng/mL Phencyclidine (PCP), urine         Cutoff 25 ng/mL Cannabinoid, urine                 Cutoff 50 ng/mL Barbiturates + metabolites, urine  Cutoff 200 ng/mL Benzodiazepine, urine              Cutoff 200  ng/mL Methadone, urine                   Cutoff 300 ng/mL  The urine drug screen provides only a preliminary, unconfirmed analytical test result and should not be used for non-medical purposes. Clinical consideration and professional judgment should be applied to any positive drug screen result due to possible interfering substances. A more specific alternate chemical method must be used in order to obtain a confirmed analytical result. Gas chromatography / mass spectrometry (GC/MS) is the preferred confirm atory method. Performed at [facility], [address] [place], [place] [address]     Blood Alcohol level:  Lab Results  Component Value Date   [facility] <10 11/01/2020   ETH <10 09/47/0962    Metabolic Disorder Labs:  Lab Results  Component Value Date   HGBA1C 5.6 06/06/2020   MPG 114.02 06/06/2020   MPG 117 02/16/2020   No results found for: PROLACTIN Lab Results  Component Value Date   CHOL 84 06/06/2020   TRIG 58 06/06/2020   HDL 24 (L) 06/06/2020   CHOLHDL 3.5 06/06/2020   VLDL 12 06/06/2020   LDLCALC 48 06/06/2020   LDLCALC 69 02/16/2020    Current Medications: Current Facility-Administered Medications  Medication Dose Route Frequency Provider Last Rate Last Admin   acetaminophen (TYLENOL) tablet 650 mg  650 mg Oral Q6H PRN Clapacs, John  T, MD       alum & mag hydroxide-simeth (MAALOX/MYLANTA) 200-200-20 MG/5ML suspension 30 mL  30 mL Oral Q4H PRN Clapacs, John T, MD       aspirin EC tablet 81 mg  81 mg Oral Daily Clapacs, John T, MD   81 mg at 11/03/20 0725   atorvastatin (LIPITOR) tablet 80 mg  80 mg Oral QPM Clapacs, John T, MD   80 mg at 11/02/20 2324   citalopram (CELEXA) tablet 10 mg  10 mg Oral Daily [name], MD   10 mg at 11/03/20 1207   cyclobenzaprine (FLEXERIL) tablet 5 mg  5 mg Oral QHS [name], MD       diclofenac Sodium (VOLTAREN) 1 % topical gel 4 g  4 g Topical QID Clapacs, John T, MD   4 g at 11/03/20 1209    famotidine (PEPCID) tablet 20 mg  20 mg Oral BID Clapacs, [name], MD   20 mg at 11/03/20 0725   gabapentin (NEURONTIN) capsule 400 mg  400 mg Oral TID Clapacs, [name], MD   400 mg at 11/03/20 1207   lisinopril (ZESTRIL) tablet 5 mg  5 mg Oral Daily Clapacs, John T, MD   5 mg at 11/03/20 0725   magnesium hydroxide (MILK OF MAGNESIA) suspension 30 mL  30 mL Oral Daily PRN Clapacs, John T, MD       meloxicam (MOBIC) tablet 7.5 mg  7.5 mg Oral Daily [name] M, MD   7.5 mg at 11/03/20 1207   pantoprazole (PROTONIX) EC tablet 40 mg  40 mg Oral BID AC [name], MD       QUEtiapine (SEROQUEL) tablet 200 mg  200 mg Oral QHS [name], MD       PTA Medications: Medications Prior to Admission  Medication Sig Dispense Refill Last Dose   aspirin EC 81 MG tablet Take 81 mg by mouth daily. Swallow whole.      atorvastatin (LIPITOR) 80 MG tablet Take 80 mg by mouth every evening.      citalopram (CELEXA) 20 MG tablet Take 1 tablet (20 mg total) by mouth daily. 30 tablet 0    diclofenac Sodium (VOLTAREN) 1 % GEL Apply 4 g topically 4 (four) times daily.      famotidine (PEPCID) 20 MG tablet Take 20 mg by mouth 2 (two) times daily.      gabapentin (NEURONTIN) 400 MG capsule Take 400 mg by mouth 3 (three) times daily.      lidocaine (LIDODERM) 5 % Place 1 patch onto the skin daily. Remove & Discard patch within 12 hours or as directed by MD      lisinopril (ZESTRIL) 5 MG tablet Take 1 tablet (5 mg total) by mouth daily. 30 tablet 0    pantoprazole (PROTONIX) 40 MG tablet Take 1 tablet (40 mg total) by mouth daily. 30 tablet 1    QUEtiapine (SEROQUEL) 100 MG tablet Take 100 mg by mouth at bedtime.      QUEtiapine (SEROQUEL) 300 MG tablet TAKE 1 TABLET BY MOUTH AT BEDTIME 7 tablet 0    Riboflavin 100 MG CAPS Take 100 mg by mouth daily.      tiZANidine (ZANAFLEX) 2 MG tablet Take 2 mg by mouth every 8 (eight) hours as needed for muscle spasms.       Musculoskeletal: Strength & Muscle Tone:   Decreased strength on the left side that is distractible Gait & Station:  Patient  ambulates with a limp Patient leans: Right            Psychiatric Specialty Exam:  Presentation  General Appearance: Appropriate for Environment; Casual  Eye Contact:Good  Speech:Clear and Coherent; Normal Rate  Speech Volume:Normal  Handedness:Right   Mood and Affect  Mood:Irritable  Affect:Congruent   Thought Process  Thought Processes:Goal Directed; Coherent  Duration of Psychotic Symptoms: Less than six months  Past Diagnosis of Schizophrenia or Psychoactive disorder: No  Descriptions of Associations:Intact  Orientation:Full (Time, Place and Person)  Thought Content:Logical; Perseveration  Hallucinations:Hallucinations: None  Ideas of Reference:None  Suicidal Thoughts:Suicidal Thoughts: Yes, Passive  Homicidal Thoughts:Homicidal Thoughts: Yes, Passive   Sensorium  Memory:Immediate Good; Recent Good; Remote Good  Judgment:Impaired  Insight:Shallow   Executive Functions  Concentration:Fair  Attention Span:Fair  [place] of Knowledge:Good  Language:Good   Psychomotor Activity  Psychomotor Activity:Psychomotor Activity: Normal   Assets  Assets:Communication Skills; Desire for Improvement; Resilience; Social Support; Talents/Skills   Sleep  Sleep:Sleep: Fair Number of Hours of Sleep: 5.5    Physical Exam: Physical Exam Vitals and nursing note reviewed.  Constitutional:      Appearance: Normal appearance.  HENT:     Head: Normocephalic and atraumatic.     Right Ear: External ear normal.     Left Ear: External ear normal.     Nose: Nose normal.     Mouth/Throat:     Mouth: Mucous membranes are moist.     Pharynx: Oropharynx is clear.  Eyes:     Extraocular Movements: Extraocular movements intact.     Conjunctiva/sclera: Conjunctivae normal.     Pupils: Pupils are equal, round, and reactive to light.  Cardiovascular:      Rate and Rhythm: Normal rate.     Pulses: Normal pulses.  Pulmonary:     Effort: Pulmonary effort is normal.     Breath sounds: Normal breath sounds.  Abdominal:     General: Abdomen is flat.     Palpations: Abdomen is soft.  Musculoskeletal:     Cervical back: Normal range of motion and neck supple.     Comments: Decreased range of motion on left side of body that is distractible   Skin:    General: Skin is warm and dry.  Neurological:     Mental Status: He is alert and oriented to person, place, and time.     Comments: Left sided facial weakness. Decreased strength in left upper body and lower body that is distractible and effort dependent  Psychiatric:        Attention and Perception: Attention and perception normal.        Mood and Affect: Affect is angry.        Speech: Speech normal.        Behavior: Behavior is agitated.        Thought Content: Thought content includes homicidal and suicidal ideation.        Cognition and Memory: Cognition and memory normal.        Judgment: Judgment is inappropriate.   Review of Systems  Constitutional:  Positive for malaise/fatigue. Negative for fever.  HENT: Negative.    Eyes: Negative.   Respiratory: Negative.    Cardiovascular: Negative.   Gastrointestinal:  Positive for heartburn, nausea and vomiting. Negative for constipation and diarrhea.  Genitourinary: Negative.   Musculoskeletal:  Positive for back pain, joint pain and myalgias.  Skin: Negative.   Neurological:  Positive for focal weakness and headaches.  Endo/Heme/Allergies:  Positive  for environmental allergies. Does not bruise/bleed easily.  Psychiatric/Behavioral:  Positive for depression, substance abuse and suicidal ideas. The patient has insomnia.   Blood pressure (!) 151/84, pulse (!) 59, temperature 98.9 F (37.2 C), temperature source Oral, resp. rate 18, height 6\' 1"  (1.854 m), weight 49.9 kg, SpO2 100 %. Body mass index is 14.51 kg/m.  Treatment Plan  Summary: Daily contact with patient to assess and evaluate symptoms and progress in treatment and Medication management 60 year old male presenting with worsening mood in the context of medication noncompliance and relapse on cocaine.  Continue aspirin 81 mg daily for history of CVA.  Continue Lipitor 80 mg every evening for hyperlipidemia.  Continue Celexa 10 mg daily and Seroquel 200 mg at bedtime for mood.  Flexeril 5 mg at bedtime and gabapentin 400 mg 3 times daily for neuropathic pain.  Lisinopril 5 mg daily for hypertension.  Protonix 40 mg twice daily with meals and outpatient follow-up for acid reflux.  Observation Level/Precautions:  15 minute checks  Laboratory:   lipid panel, hemoglobin a1c  Psychotherapy:    Medications:    Consultations:    Discharge Concerns:    Estimated LOS:  Other:     Physician Treatment Plan for Primary Diagnosis: Severe recurrent major depression without psychotic features ([place]) Long Term Goal(s): Improvement in symptoms so as ready for discharge  Short Term Goals: Ability to identify changes in lifestyle to reduce recurrence of condition will improve, Ability to verbalize feelings will improve, Ability to disclose and discuss suicidal ideas, Ability to demonstrate self-control will improve, Ability to identify and develop effective coping behaviors will improve, Ability to maintain clinical measurements within normal limits will improve, Compliance with prescribed medications will improve, and Ability to identify triggers associated with substance abuse/mental health issues will improve  Physician Treatment Plan for Secondary Diagnosis: Principal Problem:   Severe recurrent major depression without psychotic features ([place]) Active Problems:   Gastroesophageal reflux disease with esophagitis   Cocaine abuse ([place])   Substance induced mood disorder (HCC)   HTN (hypertension)   HLD (hyperlipidemia)  Long Term Goal(s): Improvement in symptoms so as ready for  discharge  Short Term Goals: Ability to identify changes in lifestyle to reduce recurrence of condition will improve, Ability to verbalize feelings will improve, Ability to disclose and discuss suicidal ideas, Ability to demonstrate self-control will improve, Ability to identify and develop effective coping behaviors will improve, Ability to maintain clinical measurements within normal limits will improve, Compliance with prescribed medications will improve, and Ability to identify triggers associated with substance abuse/mental health issues will improve  I certify that inpatient services furnished can reasonably be expected to improve the patient's condition.    [name], MD 7/13/202212:38 PM

## 2020-11-03 NOTE — [facility] Suicide Risk Assessment (Signed)
[place] INPATIENT:  Family/Significant Other Suicide Prevention Education  Suicide Prevention Education: SPE completed with pt, as pt refused to consent to family contact. SPI pamphlet provided to pt and pt was encouraged to share information with support network, ask questions, and talk about any concerns relating to SPE. Pt denies access to guns/firearms and verbalized understanding of information provided. Mobile Crisis information also provided to pt..  Patient Refusal for Family/Significant Other Suicide Prevention Education: The patient Samuel Molina has refused to provide written consent for family/significant other to be provided Family/Significant Other Suicide Prevention Education during admission and/or prior to discharge.  Physician notified.  Giavanni Zeitlin A [place] 11/03/2020, 3:09 PM

## 2020-11-03 NOTE — Progress Notes (Signed)
Patient pleasant but easily irritated. Denies SI, HI, AVH. Medication compliant. Appropriate with staff and peers. Endorses depression. Isolative to room. Out for meds and snacks. Encouragement and support provided. Safety checks maintained. Medications given as prescribed. Pt receptive and remains safe on unit with q 15 min checks.

## 2020-11-03 NOTE — Progress Notes (Signed)
Recreation Therapy Notes   Date: 11/03/2020  Time: 10:15 am   Location: Craft Room     Behavioral response: N/A   Intervention Topic: Goals    Discussion/Intervention: Patient did not attend group.   Clinical Observations/Feedback:  Patient did not attend group.   Belton Peplinski LRT/CTRS         Khushbu Pippen 11/03/2020 12:41 PM

## 2020-11-03 NOTE — Tx Team (Addendum)
Interdisciplinary Treatment and Diagnostic Plan Update  11/03/2020 Time of Session: 9:00AM Rathana Viveros MRN: [id number]  Principal Diagnosis: <principal problem not specified>  Secondary Diagnoses: Active Problems:   Severe recurrent major depression without psychotic features (HCC)   Current Medications:  Current Facility-Administered Medications  Medication Dose Route Frequency Provider Last Rate Last Admin   acetaminophen (TYLENOL) tablet 650 mg  650 mg Oral Q6H PRN Clapacs, John T, MD       alum & mag hydroxide-simeth (MAALOX/MYLANTA) 200-200-20 MG/5ML suspension 30 mL  30 mL Oral Q4H PRN Clapacs, [name], MD       aspirin EC tablet 81 mg  81 mg Oral Daily Clapacs, [name], MD   81 mg at 11/03/20 0725   atorvastatin (LIPITOR) tablet 80 mg  80 mg Oral QPM Clapacs, John T, MD   80 mg at 11/02/20 2324   diclofenac Sodium (VOLTAREN) 1 % topical gel 4 g  4 g Topical QID Clapacs, John T, MD   4 g at 11/02/20 2327   famotidine (PEPCID) tablet 20 mg  20 mg Oral BID Clapacs, John T, MD   20 mg at 11/03/20 0725   gabapentin (NEURONTIN) capsule 400 mg  400 mg Oral TID Clapacs, John T, MD   400 mg at 11/03/20 0725   lisinopril (ZESTRIL) tablet 5 mg  5 mg Oral Daily Clapacs, John T, MD   5 mg at 11/03/20 0725   magnesium hydroxide (MILK OF MAGNESIA) suspension 30 mL  30 mL Oral Daily PRN Clapacs, John T, MD       pantoprazole (PROTONIX) EC tablet 40 mg  40 mg Oral Daily Clapacs, John T, MD   40 mg at 11/03/20 0725   QUEtiapine (SEROQUEL) tablet 100 mg  100 mg Oral QHS Clapacs, John T, MD   100 mg at 11/02/20 2325   PTA Medications: Medications Prior to Admission  Medication Sig Dispense Refill Last Dose   aspirin EC 81 MG tablet Take 81 mg by mouth daily. Swallow whole.      atorvastatin (LIPITOR) 80 MG tablet Take 80 mg by mouth every evening.      citalopram (CELEXA) 20 MG tablet Take 1 tablet (20 mg total) by mouth daily. 30 tablet 0    diclofenac Sodium (VOLTAREN) 1 % GEL Apply 4 g  topically 4 (four) times daily.      famotidine (PEPCID) 20 MG tablet Take 20 mg by mouth 2 (two) times daily.      gabapentin (NEURONTIN) 400 MG capsule Take 400 mg by mouth 3 (three) times daily.      lidocaine (LIDODERM) 5 % Place 1 patch onto the skin daily. Remove & Discard patch within 12 hours or as directed by MD      lisinopril (ZESTRIL) 5 MG tablet Take 1 tablet (5 mg total) by mouth daily. 30 tablet 0    pantoprazole (PROTONIX) 40 MG tablet Take 1 tablet (40 mg total) by mouth daily. 30 tablet 1    QUEtiapine (SEROQUEL) 100 MG tablet Take 100 mg by mouth at bedtime.      QUEtiapine (SEROQUEL) 300 MG tablet TAKE 1 TABLET BY MOUTH AT BEDTIME 7 tablet 0    Riboflavin 100 MG CAPS Take 100 mg by mouth daily.      tiZANidine (ZANAFLEX) 2 MG tablet Take 2 mg by mouth every 8 (eight) hours as needed for muscle spasms.       Patient Stressors: Financial difficulties Health problems Substance abuse  Patient Strengths:  Ability for insight Motivation for treatment/growth Supportive family/friends  Treatment Modalities: Medication Management, Group therapy, Case management,  1 to 1 session with clinician, Psychoeducation, Recreational therapy.   Physician Treatment Plan for Primary Diagnosis: <principal problem not specified> Long Term Goal(s):     Short Term Goals:    Medication Management: Evaluate patient's response, side effects, and tolerance of medication regimen.  Therapeutic Interventions: 1 to 1 sessions, Unit Group sessions and Medication administration.  Evaluation of Outcomes: Not Met  Physician Treatment Plan for Secondary Diagnosis: Active Problems:   Severe recurrent major depression without psychotic features ([place])  Long Term Goal(s):     Short Term Goals:       Medication Management: Evaluate patient's response, side effects, and tolerance of medication regimen.  Therapeutic Interventions: 1 to 1 sessions, Unit Group sessions and Medication  administration.  Evaluation of Outcomes: Not Met   RN Treatment Plan for Primary Diagnosis: <principal problem not specified> Long Term Goal(s): Knowledge of disease and therapeutic regimen to maintain health will improve  Short Term Goals: Ability to demonstrate self-control, Ability to participate in decision making will improve, Ability to verbalize feelings will improve, Ability to disclose and discuss suicidal ideas, Ability to identify and develop effective coping behaviors will improve, and Compliance with prescribed medications will improve  Medication Management: RN will administer medications as ordered by provider, will assess and evaluate patient's response and provide education to patient for prescribed medication. RN will report any adverse and/or side effects to prescribing provider.  Therapeutic Interventions: 1 on 1 counseling sessions, Psychoeducation, Medication administration, Evaluate responses to treatment, Monitor vital signs and CBGs as ordered, Perform/monitor CIWA, COWS, AIMS and Fall Risk screenings as ordered, Perform wound care treatments as ordered.  Evaluation of Outcomes: Not Met   LCSW Treatment Plan for Primary Diagnosis: <principal problem not specified> Long Term Goal(s): Safe transition to appropriate next level of care at discharge, Engage patient in therapeutic group addressing interpersonal concerns.  Short Term Goals: Engage patient in aftercare planning with referrals and resources, Increase social support, Increase ability to appropriately verbalize feelings, Increase emotional regulation, Facilitate acceptance of mental health diagnosis and concerns, Identify triggers associated with mental health/substance abuse issues, and Increase skills for wellness and recovery  Therapeutic Interventions: Assess for all discharge needs, 1 to 1 time with Social worker, Explore available resources and support systems, Assess for adequacy in community support  network, Educate family and significant other(s) on suicide prevention, Complete Psychosocial Assessment, Interpersonal group therapy.  Evaluation of Outcomes: Not Met   Progress in Treatment: Attending groups: No. Participating in groups: No. Taking medication as prescribed: Yes. Toleration medication: Yes. Family/Significant other contact made: No, will contact:  once permission is given. Patient understands diagnosis: Yes. Discussing patient identified problems/goals with staff: Yes. Medical problems stabilized or resolved: Yes. Denies suicidal/homicidal ideation: Yes. Issues/concerns per patient self-inventory: No. Other: none  New problem(s) identified: No, Describe:  none  New Short Term/Long Term Goal(s): elimination of symptoms of psychosis, medication management for mood stabilization; elimination of SI thoughts; development of comprehensive mental wellness/sobriety plan.   Patient Goals:  "getting my head together"  Discharge Plan or Barriers: CSW will assist patient in developing an appropriate discharge plan to meet his needs.  Reason for Continuation of Hospitalization: Anxiety Depression Medication stabilization Suicidal ideation  Estimated Length of Stay:  1-7 days  Recreational Therapy: Patient Stressors: N/A Patient Goal: Patient will engage in groups without prompting or encouragement from LRT x3 group sessions within 5 recreation  therapy group sessions.  Attendees: Patient: Moussa Wiegand  11/03/2020 9:40 AM  Physician: Dr. Domingo Cocking, MD  11/03/2020 9:40 AM  Nursing: Marla Roe, RN 11/03/2020 9:40 AM  RN Care Manager: 11/03/2020 9:40 AM  Social Worker: Assunta Curtis, MSW, LCSW  11/03/2020 9:40 AM  Recreational Therapist: Roanna Epley, Reather Converse, LRT  11/03/2020 9:40 AM  Other: Kiva Martinique, MSW, LCSWA  11/03/2020 9:40 AM  Other: Gwynneth Munson" [place], [place]  11/03/2020 9:40 AM  Other: 11/03/2020 9:40 AM    Scribe for Treatment Team: Rozann Lesches,  LCSW 11/03/2020 9:40 AM

## 2020-11-03 NOTE — BHH Suicide Risk Assessment (Signed)
[facility] Admission Suicide Risk Assessment   Nursing information obtained from:  Patient Demographic factors:  Male, Low socioeconomic status Current Mental Status:  Suicidal ideation indicated by patient Loss Factors:  Decline in physical health Historical Factors:  Impulsivity Risk Reduction Factors:  Religious beliefs about death, Living with another person, especially a relative  Total Time spent with patient: 1 hour Principal Problem: Severe recurrent major depression without psychotic features ([place]) Diagnosis:  Principal Problem:   Severe recurrent major depression without psychotic features ([place]) Active Problems:   Gastroesophageal reflux disease with esophagitis   Cocaine abuse (HCC)   Substance induced mood disorder (HCC)   HTN (hypertension)   HLD (hyperlipidemia)  Subjective Data: 60 year old male with history of mood problems and substance abuse presenting for suicidal ideations and homicidal ideations in the context of relapse.  Patient notes that he has been off of his medications for quite some time and undergoing a plethora of medical problems.  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  He says this had him feeling very depressed and self-medicating with cocaine.  Since then his mood has been gradually worse and he is experienced a lot more anger.  He alternates from feeling suicidal and homicidal and continues to have these thoughts today though contracts for safety.  He denies any auditory or visual hallucinations.  He also continues to complain of hiccups acid reflux and vomiting.  He wants gastroenterology to do an endoscopy during admission.  He gets very angry when told that this will likely have to be done on an outpatient basis.  She is also complaining of leg pain and cramping from his knees running to the front of his shins.  He is agreeable to restarting Seroquel  200 mg and Celexa 10 mg for mood.  He is also agreeable to Mobic for pain and Protonix for acid reflux.  Patient also notes that he supposed to be on a blood thinner.  Continued Clinical Symptoms:  Alcohol Use Disorder Identification Test Final Score (AUDIT): 0 The "Alcohol Use Disorders Identification Test", Guidelines for Use in Primary Care, Second Edition.  World Pharmacologist [facility]). Score between 0-7:  no or low risk or alcohol related problems. Score between 8-15:  moderate risk of alcohol related problems. Score between 16-19:  high risk of alcohol related problems. Score 20 or above:  warrants further diagnostic evaluation for alcohol dependence and treatment.   CLINICAL FACTORS:   Severe Anxiety and/or Agitation Depression:   Comorbid alcohol abuse/dependence Hopelessness Severe Unstable or Poor Therapeutic Relationship Previous Psychiatric Diagnoses and Treatments Medical Diagnoses and Treatments/Surgeries   Musculoskeletal: Strength & Muscle Tone: within normal limits Gait & Station: normal Patient leans: N/A  Psychiatric Specialty Exam:  Presentation  General Appearance: Appropriate for Environment; Casual  Eye Contact:Good  Speech:Clear and Coherent; Normal Rate  Speech Volume:Normal  Handedness:Right   Mood and Affect  Mood:Irritable  Affect:Congruent   Thought Process  Thought Processes:Goal Directed; Coherent  Descriptions of Associations:Intact  Orientation:Full (Time, Place and Person)  Thought Content:Logical; Perseveration  History of Schizophrenia/Schizoaffective disorder:No  Duration of Psychotic Symptoms:Less than six months  Hallucinations:Hallucinations: None  Ideas of Reference:None  Suicidal Thoughts:Suicidal Thoughts: Yes, Passive  Homicidal Thoughts:Homicidal Thoughts: Yes, Passive   Sensorium  Memory:Immediate Good; Recent Good; Remote Good  Judgment:Impaired  Insight:Shallow   Executive Functions   Concentration:Fair  Attention Span:Fair  Recall:Fair  Fund of Knowledge:Good  Language:Good   Psychomotor Activity  Psychomotor Activity:Psychomotor Activity: Normal   Assets  Assets:Communication Skills; Desire for Improvement; Resilience; Social Support; Talents/Skills   Sleep  Sleep:Sleep: Fair Number of Hours of Sleep: 5.5    Physical Exam: Physical Exam ROS Blood pressure (!) 151/84, pulse (!) 59, temperature 98.9 F (37.2 C), temperature source Oral, resp. rate 18, height 6\' 1"  (1.854 m), weight 49.9 kg, SpO2 100 %. Body mass index is 14.51 kg/m.   COGNITIVE FEATURES THAT CONTRIBUTE TO RISK:  Loss of executive function    SUICIDE RISK:   Moderate:  Frequent suicidal ideation with limited intensity, and duration, some specificity in terms of plans, no associated intent, good self-control, limited dysphoria/symptomatology, some risk factors present, and identifiable protective factors, including available and accessible social support.  PLAN OF CARE: Continue admission, see H&P for details.   I certify that inpatient services furnished can reasonably be expected to improve the patient's condition.   [name], MD 11/03/2020, 12:54 PM

## 2020-11-03 NOTE — Progress Notes (Signed)
Recreation Therapy Notes  INPATIENT RECREATION TR PLAN  Patient Details Name: Brynn Reznik MRN: [id number] DOB: 20-Mar-1961 Today's Date: 11/03/2020  Rec Therapy Plan Is patient appropriate for Therapeutic Recreation?: Yes Treatment times per week: at least 3 Estimated Length of Stay: 5-7 days TR Treatment/Interventions: Group participation (Comment)  Discharge Criteria Pt will be discharged from therapy if:: Discharged Treatment plan/goals/alternatives discussed and agreed upon by:: Patient/family  Discharge Summary     Rhian Funari 11/03/2020, 3:56 PM

## 2020-11-03 NOTE — Tx Team (Signed)
Initial Treatment Plan 11/03/2020 4:22 AM [name] VOJ:[id number]    PATIENT STRESSORS: Financial difficulties Health problems Substance abuse   PATIENT STRENGTHS: Ability for insight Motivation for treatment/growth Supportive family/friends   PATIENT IDENTIFIED PROBLEMS: Suicide Risk     Substance Abuse                  DISCHARGE CRITERIA:  Motivation to continue treatment in a less acute level of care  PRELIMINARY DISCHARGE PLAN: Outpatient therapy  PATIENT/FAMILY INVOLVEMENT: This treatment plan has been presented to and reviewed with the patient, Samuel Molina, The patient and family have been given the opportunity to ask questions and make suggestions.  Harl Bowie, RN 11/03/2020, 4:22 AM

## 2020-11-03 NOTE — Progress Notes (Signed)
Recreation Therapy Notes  INPATIENT RECREATION THERAPY ASSESSMENT  Patient Details Name: Samuel Molina MRN: [id number] DOB: [birth year] Today's Date: 11/03/2020       Information Obtained From: Chart Review  Able to Participate in Assessment/Interview: Yes  Patient Presentation: Withdrawn, Resistant  Reason for Admission (Per Patient): Substance Abuse  Patient Stressors:    Coping Skills:   Substance Abuse  Leisure Interests (2+):  Individual - Writing, Sports - Basketball  Frequency of Recreation/Participation:    Awareness of Community Resources:     [employer]:     Current Use:    If no, Barriers?:    Expressed Interest in [place] of Residence:  Insurance underwriter  Patient Main Form of Transportation: Musician  Patient Strengths:  Communication  Patient Identified Areas of Improvement:  Drug use  Patient Goal for Hospitalization:  Getting my head together  Current SI (including self-harm):     Current HI:     Current AVH:    Staff Intervention Plan: Group Attendance, Collaborate with Interdisciplinary Treatment Team  Consent to Intern Participation: N/A  Heather Streeper 11/03/2020, 3:52 PM

## 2020-11-03 NOTE — Progress Notes (Signed)
Patient is irritable during assessment this morning. He complains of pain from his neck to his back. Patient also endorses bad acid reflux. Patient became frustrated and endorsed suicidal ideations. Patient later was apologetic and pleasant. Patient is compliant with scheduled medications. Mood is labile. Patient remains safe on the unit at this time.
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
## 2020-11-03 NOTE — Progress Notes (Signed)
Admission Note:  60 yr male who presents IVC in no acute distress for the treatment of SI, Substance Abuse and Depression. Patient appears flat and sad at times he was argumentative, with staff but later was calm and cooperative with admission process. Patient currently presents with passive SI and contracts for safety.  Patient explained he was experiencing worsening depression, and substance abuse. Patient was offered emotional support and encouraged to attend scheduled groups and therapy sessions.  Patient has Past medical Hx of  Depression, Stroke,  Substance Abuse, Personality Disorder, GERD, Anxiety.  Skin was assessed and found to be warm, dry and intact. Patient was searched and no contraband found, POC and unit policies explained, understanding verbalized and consents obtained. Food and fluids offered, and he accepted it, he was given scheduled night medication. No distress noted 15 minutes safety checks maintained will continue to monitor.

## 2020-11-04 DIAGNOSIS — F332 Major depressive disorder, recurrent severe without psychotic features: Secondary | ICD-10-CM | POA: Diagnosis not present

## 2020-11-04 LAB — LIPID PANEL
Cholesterol: 133 mg/dL (ref 0–200)
HDL: 34 mg/dL — ABNORMAL LOW (ref >40)
LDL Cholesterol: 71 mg/dL (ref 0–99)
Total CHOL/HDL Ratio: 3.9 RATIO
Triglycerides: 140 mg/dL (ref <150)
VLDL: 28 mg/dL (ref 0–40)

## 2020-11-04 LAB — HEMOGLOBIN A1C
Hgb A1c MFr Bld: 5.8 % — ABNORMAL HIGH (ref 4.8–5.6)
Mean Plasma Glucose: 119.76 mg/dL

## 2020-11-04 MED ORDER — LISINOPRIL 5 MG PO TABS
10.0000 mg | ORAL_TABLET | Freq: Every day | ORAL | Status: DC
  Administered 2020-11-05 – 2020-11-07 (×3): 10 mg via ORAL
  Filled 2020-11-04 (×3): qty 2

## 2020-11-04 MED ORDER — PROCHLORPERAZINE MALEATE 5 MG PO TABS
5.0000 mg | ORAL_TABLET | Freq: Four times a day (QID) | ORAL | Status: DC | PRN
  Filled 2020-11-04: qty 1

## 2020-11-04 NOTE — Progress Notes (Signed)
Patient is pleasant this morning. He denies suicidal ideations, homicidal ideations, and auditory and visual hallucinations. He rates pain as a 8/10 and states it is on his entire left side. Patient also says he feels his eye is getting worse and appears blurrier. On assessment, patient's eye is half shut. No swelling or redness noted. Patient states it began when he had several strokes over the last few months. Upon discussion with Dr. Domingo Cocking, patient's workup at [facility] was negative for stroke.   Patient is compliant with all scheduled medications. He is active on the unit. Patient remains safe on the unit at this time.

## 2020-11-04 NOTE — Progress Notes (Signed)
[facility] MD Progress Note  11/04/2020 12:20 PM Samuel Molina  MRN:  [id number]  CC "I need to see a real doctor."  Subjective: 60 year old male with history of mood problems and substance abuse presenting for suicidal ideations and homicidal ideations.  He notes he was able to fall asleep, but unable to stay asleep despite medications.  He continues to remain fixated on some physical complaints.  Specifically he talks about the left eye droop, tearing, and increased blurring vision.  He demands to be seen by neurology.  He has also very irate that gastroenterology will only see him on an outpatient basis.  On physical exam his left eye droop is very distractible but remains present during conversation.  No tearing or redness noticed in his eye.  Contacted Dr. Rory Percy via secure chat.  This neurologist is very familiar with patient as he has seen him in the hospital several times along with his colleagues on the neurology floor.  He has had an extensive work-up done in our hospital and [facility] as noted yesterday.  Neurology feels that best he might have Bell's palsy or complex migraines with strokelike symptoms.  The recommend against further medical work-up as he has had 3 negative MRIs to date.  They did state we could try Compazine for migraines to help with symptoms as needed and this was ordered.  Quincey does express homicidal ideations today as a result of his perceived lack of care. Principal Problem: Severe recurrent major depression without psychotic features ([place]) Diagnosis: Principal Problem:   Severe recurrent major depression without psychotic features ([place]) Active Problems:   Gastroesophageal reflux disease with esophagitis   Cocaine abuse ([place])   Substance induced mood disorder (HCC)   HTN (hypertension)   HLD (hyperlipidemia)  Total Time spent with patient: 30 minutes  Past Psychiatric History: See H&P  Past Medical History:  Past Medical History:  Diagnosis Date   Anxiety     Depression    GERD (gastroesophageal reflux disease)    Obesity    Stroke ([place])    Substance abuse ([place])     Past Surgical History:  Procedure Laterality Date   ESOPHAGOGASTRODUODENOSCOPY (EGD) WITH PROPOFOL N/A 09/20/2017   Procedure: ESOPHAGOGASTRODUODENOSCOPY (EGD) WITH PROPOFOL;  Surgeon: Lin Landsman, MD;  Location: [facility] ENDOSCOPY;  Service: Gastroenterology;  Laterality: N/A;   KNEE SURGERY Right    Family History:  Family History  Problem Relation Age of Onset   Vasculitis Mother    Family Psychiatric  History: See H&P Social History:  Social History   Substance and Sexual Activity  Alcohol Use Not Currently     Social History   Substance and Sexual Activity  Drug Use Yes   Types: Cocaine    Social History   Socioeconomic History   Marital status: Single    Spouse name: Not on file   Number of children: Not on file   Years of education: Not on file   Highest education level: Not on file  Occupational History   Not on file  Tobacco Use   Smoking status: Every Day    Packs/day: 0.50    Types: Cigarettes   Smokeless tobacco: Never  Vaping Use   Vaping Use: Never used  Substance and Sexual Activity   Alcohol use: Not Currently   Drug use: Yes    Types: Cocaine   Sexual activity: Not Currently  Other Topics Concern   Not on file  Social History Narrative   Not on file   Social  Determinants of Health   Financial Resource Strain: Not on file  Food Insecurity: Not on file  Transportation Needs: Not on file  Physical Activity: Not on file  Stress: Not on file  Social Connections: Not on file   Additional Social History:                         Sleep: Poor  Appetite:  Fair  Current Medications: Current Facility-Administered Medications  Medication Dose Route Frequency Provider Last Rate Last Admin   acetaminophen (TYLENOL) tablet 650 mg  650 mg Oral Q6H PRN Clapacs, [name], MD       alum & mag hydroxide-simeth (MAALOX/MYLANTA)  200-200-20 MG/5ML suspension 30 mL  30 mL Oral Q4H PRN Clapacs, [name], MD       aspirin EC tablet 81 mg  81 mg Oral Daily Clapacs, John T, MD   81 mg at 11/04/20 0807   atorvastatin (LIPITOR) tablet 80 mg  80 mg Oral QPM Clapacs, John T, MD   80 mg at 11/03/20 1700   citalopram (CELEXA) tablet 10 mg  10 mg Oral Daily [name], MD   10 mg at 11/04/20 0807   cyclobenzaprine (FLEXERIL) tablet 5 mg  5 mg Oral QHS [name], MD   5 mg at 11/03/20 2051   diclofenac Sodium (VOLTAREN) 1 % topical gel 4 g  4 g Topical QID Clapacs, [name], MD   4 g at 11/04/20 1194   gabapentin (NEURONTIN) capsule 400 mg  400 mg Oral TID Clapacs, [name], MD   400 mg at 11/04/20 0807   lisinopril (ZESTRIL) tablet 5 mg  5 mg Oral Daily Clapacs, [name], MD   5 mg at 11/04/20 1740   magnesium hydroxide (MILK OF MAGNESIA) suspension 30 mL  30 mL Oral Daily PRN Clapacs, [name], MD       meloxicam (MOBIC) tablet 7.5 mg  7.5 mg Oral Daily [name] M, MD   7.5 mg at 11/04/20 0807   pantoprazole (PROTONIX) EC tablet 40 mg  40 mg Oral BID AC [name], MD   40 mg at 11/04/20 8144   prochlorperazine (COMPAZINE) tablet 5 mg  5 mg Oral Q6H PRN [name], MD       QUEtiapine (SEROQUEL) tablet 200 mg  200 mg Oral QHS [name], MD   200 mg at 11/03/20 2051    Lab Results:  Results for orders placed or performed during the hospital encounter of 11/02/20 (from the past 48 hour(s))  Lipid panel     Status: Abnormal   Collection Time: 11/04/20  9:28 AM  Result Value Ref Range   Cholesterol 133 0 - 200 mg/dL   Triglycerides 140 <150 mg/dL   HDL 34 (L) >40 mg/dL   Total CHOL/HDL Ratio 3.9 RATIO   VLDL 28 0 - 40 mg/dL   LDL Cholesterol 71 0 - 99 mg/dL    Comment:        Total Cholesterol/HDL:CHD Risk Coronary Heart Disease Risk Table                     Men   Women  1/2 Average Risk   3.4   3.3  Average Risk       5.0   4.4  2 X Average Risk   9.6   7.1  3 X Average Risk  23.4   11.0  Use the calculated Patient Ratio above and the CHD Risk Table to determine the patient's CHD Risk.        ATP III CLASSIFICATION (LDL):  <100     mg/dL   Optimal  100-129  mg/dL   Near or Above                    Optimal  130-159  mg/dL   Borderline  160-189  mg/dL   High  >190     mg/dL   Very High Performed at [facility], [address] [place], [place] [address]     Blood Alcohol level:  Lab Results  Component Value Date   [facility] <10 11/01/2020   ETH <10 16/38/4536    Metabolic Disorder Labs: Lab Results  Component Value Date   HGBA1C 5.6 06/06/2020   MPG 114.02 06/06/2020   MPG 117 02/16/2020   No results found for: PROLACTIN Lab Results  Component Value Date   CHOL 133 11/04/2020   TRIG 140 11/04/2020   HDL 34 (L) 11/04/2020   CHOLHDL 3.9 11/04/2020   VLDL 28 11/04/2020   LDLCALC 71 11/04/2020   LDLCALC 48 06/06/2020    Physical Findings: AIMS:  , ,  ,  ,    CIWA:    COWS:     Musculoskeletal: Strength & Muscle Tone: within normal limits Gait & Station: normal Patient leans: N/A  Psychiatric Specialty Exam:  Presentation  General Appearance: Appropriate for Environment; Casual  Eye Contact:Good  Speech:Clear and Coherent; Normal Rate  Speech Volume:Normal  Handedness:Right   Mood and Affect  Mood:Irritable  Affect:Congruent   Thought Process  Thought Processes:Goal Directed; Coherent  Descriptions of Associations:Intact  Orientation:Full (Time, Place and Person)  Thought Content:Logical; Perseveration  History of Schizophrenia/Schizoaffective disorder:No  Duration of Psychotic Symptoms:Less than six months  Hallucinations:Hallucinations: None  Ideas of Reference:None  Suicidal Thoughts:Suicidal Thoughts: Yes, Passive  Homicidal Thoughts:Homicidal Thoughts: Yes, Passive   Sensorium  Memory:Immediate Good; Recent Good; Remote Good  Judgment:Impaired  Insight:Shallow   Executive Functions   Concentration:Fair  Attention Span:Fair  [place] of Knowledge:Good  Language:Good   Psychomotor Activity  Psychomotor Activity:Psychomotor Activity: Normal   Assets  Assets:Communication Skills; Desire for Improvement; Resilience; Social Support; Talents/Skills   Sleep  Sleep:Sleep: Fair Number of Hours of Sleep: 5.5    Physical Exam: Physical Exam ROS Blood pressure (!) 134/97, pulse 63, temperature 97.7 F (36.5 C), temperature source Oral, resp. rate 18, height 6\' 1"  (1.854 m), weight 49.9 kg, SpO2 100 %. Body mass index is 14.51 kg/m.   Treatment Plan Summary: Daily contact with patient to assess and evaluate symptoms and progress in treatment and Medication management 60 year old male presenting with worsening mood in the context of medication noncompliance and relapse on cocaine.  Continue aspirin 81 mg daily for history of CVA.  Continue Lipitor 80 mg every evening for hyperlipidemia.  Continue Celexa 10 mg daily and Seroquel 200 mg at bedtime for mood.  Flexeril 5 mg at bedtime and gabapentin 400 mg 3 times daily for neuropathic pain.  Lisinopril 5 mg daily for hypertension.  Protonix 40 mg twice daily with meals and outpatient follow-up for acid reflux. Start Compazine 5 mg Q6hr PRN for migraine.  [name], MD 11/04/2020, 12:20 PM

## 2020-11-04 NOTE — BHH Group Notes (Signed)
LCSW Group Therapy Note  11/04/2020 2:11 PM  Type of Therapy/Topic:  Group Therapy:  Balance in Life  Participation Level:  Did Not Attend  Description of Group:    This group will address the concept of balance and how it feels and looks when one is unbalanced. Patients will be encouraged to process areas in their lives that are out of balance and identify reasons for remaining unbalanced. Facilitators will guide patients in utilizing problem-solving interventions to address and correct the stressor making their life unbalanced. Understanding and applying boundaries will be explored and addressed for obtaining and maintaining a balanced life. Patients will be encouraged to explore ways to assertively make their unbalanced needs known to significant others in their lives, using other group members and facilitator for support and feedback.  Therapeutic Goals: Patient will identify two or more emotions or situations they have that consume much of in their lives. Patient will identify signs/triggers that life has become out of balance:  Patient will identify two ways to set boundaries in order to achieve balance in their lives:  Patient will demonstrate ability to communicate their needs through discussion and/or role plays  Summary of Patient Progress: X  Therapeutic Modalities:   Cognitive Behavioral Therapy Solution-Focused Therapy Assertiveness Training  Hedy Camara R. Guerry Bruin, MSW, LCSW, [place] 11/04/2020 2:11 PM

## 2020-11-04 NOTE — Progress Notes (Signed)
Recreation Therapy Notes   Date: 11/04/2020  Time: 10:00 am   Location: Courtyard     Behavioral response: N/A   Intervention Topic: Social Skills    Discussion/Intervention: Patient did not attend group.   Clinical Observations/Feedback:  Patient did not attend group.   Tomer Chalmers LRT/CTRS        Myer Bohlman 11/04/2020 12:10 PM

## 2020-11-05 DIAGNOSIS — F332 Major depressive disorder, recurrent severe without psychotic features: Secondary | ICD-10-CM | POA: Diagnosis not present

## 2020-11-05 NOTE — BHH Counselor (Signed)
CSW spoke with pt who stated that he was admitted to [facility] in [place], [place].   He stated that he was going to try to have his sister assist with transportation.  CSW will follow up with pt in the afternoon regarding transportation needs.    Samuel Molina, MSW, LCSW-A 7/15/202211:38 AM

## 2020-11-05 NOTE — Plan of Care (Signed)
  Problem: Medication: Goal: Compliance with prescribed medication regimen will improve Outcome: Progressing   Problem: Self-Concept: Goal: Ability to disclose and discuss suicidal ideas will improve Outcome: Progressing Goal: Will verbalize positive feelings about self Outcome: Progressing

## 2020-11-05 NOTE — BHH Counselor (Signed)
CSW followed up on if pt has transportation to [name] on Sunday. He reports that he is working on it and will follow up with CSW.    Assunta Curtis, MSW, LCSW 11/05/2020 4:03 PM

## 2020-11-05 NOTE — Progress Notes (Signed)
[facility] MD Progress Note  11/05/2020 11:36 AM Samuel Molina  MRN:  [id number]  CC "I got accepted to [facility]"  Subjective: 60 year old male with history of mood problems and substance abuse presenting for suicidal ideations and homicidal ideations.  No acute overnight events, medication compliant, attending to ADLs.  Patient notes that he has been calling sober living [facility] this morning and was excepted to the facility in [facility].  Initially they are going to have a bed on Sunday, but this was failed.  They have a bed potentially open for Thursday versus Friday.  Have encouraged patient to continue to call all of the sober living [place] facilities both in [place] and [place] to try and secure an earlier placement.  He continues to report some intermittent suicidal ideations and homicidal ideations.  He continues to be extremely agitated that no neurologist or gastroenterologist will see him here in the hospital.  No other complaints today. Principal Problem: Severe recurrent major depression without psychotic features ([place]) Diagnosis: Principal Problem:   Severe recurrent major depression without psychotic features ([place]) Active Problems:   Gastroesophageal reflux disease with esophagitis   Cocaine abuse ([place])   Substance induced mood disorder (HCC)   HTN (hypertension)   HLD (hyperlipidemia)  Total Time spent with patient: 30 minutes  Past Psychiatric History: See H&P  Past Medical History:  Past Medical History:  Diagnosis Date   Anxiety    Depression    GERD (gastroesophageal reflux disease)    Obesity    Stroke ([place])    Substance abuse ([place])     Past Surgical History:  Procedure Laterality Date   ESOPHAGOGASTRODUODENOSCOPY (EGD) WITH PROPOFOL N/A 09/20/2017   Procedure: ESOPHAGOGASTRODUODENOSCOPY (EGD) WITH PROPOFOL;  Surgeon: Lin Landsman, MD;  Location: [facility] ENDOSCOPY;  Service: Gastroenterology;  Laterality: N/A;   KNEE SURGERY Right     Family History:  Family History  Problem Relation Age of Onset   Vasculitis Mother    Family Psychiatric  History: See H&P Social History:  Social History   Substance and Sexual Activity  Alcohol Use Not Currently     Social History   Substance and Sexual Activity  Drug Use Yes   Types: Cocaine    Social History   Socioeconomic History   Marital status: Single    Spouse name: Not on file   Number of children: Not on file   Years of education: Not on file   Highest education level: Not on file  Occupational History   Not on file  Tobacco Use   Smoking status: Every Day    Packs/day: 0.50    Types: Cigarettes   Smokeless tobacco: Never  Vaping Use   Vaping Use: Never used  Substance and Sexual Activity   Alcohol use: Not Currently   Drug use: Yes    Types: Cocaine   Sexual activity: Not Currently  Other Topics Concern   Not on file  Social History Narrative   Not on file   Social Determinants of Health   Financial Resource Strain: Not on file  Food Insecurity: Not on file  Transportation Needs: Not on file  Physical Activity: Not on file  Stress: Not on file  Social Connections: Not on file   Additional Social History:                         Sleep: Poor  Appetite:  Fair  Current Medications: Current Facility-Administered Medications  Medication Dose  Route Frequency Provider Last Rate Last Admin   acetaminophen (TYLENOL) tablet 650 mg  650 mg Oral Q6H PRN Clapacs, [name], MD       alum & mag hydroxide-simeth (MAALOX/MYLANTA) 200-200-20 MG/5ML suspension 30 mL  30 mL Oral Q4H PRN Clapacs, [name], MD       aspirin EC tablet 81 mg  81 mg Oral Daily Clapacs, [name], MD   81 mg at 11/05/20 0803   atorvastatin (LIPITOR) tablet 80 mg  80 mg Oral QPM Clapacs, John T, MD   80 mg at 11/04/20 1718   citalopram (CELEXA) tablet 10 mg  10 mg Oral Daily [name], MD   10 mg at 11/05/20 0803   cyclobenzaprine (FLEXERIL) tablet 5 mg  5 mg Oral  QHS [name], MD   5 mg at 11/04/20 2106   diclofenac Sodium (VOLTAREN) 1 % topical gel 4 g  4 g Topical QID Clapacs, [name], MD   4 g at 11/04/20 3491   gabapentin (NEURONTIN) capsule 400 mg  400 mg Oral TID Clapacs, [name], MD   400 mg at 11/05/20 0803   lisinopril (ZESTRIL) tablet 10 mg  10 mg Oral Daily [name], MD   10 mg at 11/05/20 0804   magnesium hydroxide (MILK OF MAGNESIA) suspension 30 mL  30 mL Oral Daily PRN Clapacs, [name], MD       meloxicam (MOBIC) tablet 7.5 mg  7.5 mg Oral Daily [name] M, MD   7.5 mg at 11/05/20 0803   pantoprazole (PROTONIX) EC tablet 40 mg  40 mg Oral BID AC [name], MD   40 mg at 11/05/20 7915   prochlorperazine (COMPAZINE) tablet 5 mg  5 mg Oral Q6H PRN [name], MD       QUEtiapine (SEROQUEL) tablet 200 mg  200 mg Oral QHS [name], MD   200 mg at 11/04/20 2106    Lab Results:  Results for orders placed or performed during the hospital encounter of 11/02/20 (from the past 48 hour(s))  Lipid panel     Status: Abnormal   Collection Time: 11/04/20  9:28 AM  Result Value Ref Range   Cholesterol 133 0 - 200 mg/dL   Triglycerides 140 <150 mg/dL   HDL 34 (L) >40 mg/dL   Total CHOL/HDL Ratio 3.9 RATIO   VLDL 28 0 - 40 mg/dL   LDL Cholesterol 71 0 - 99 mg/dL    Comment:        Total Cholesterol/HDL:CHD Risk Coronary Heart Disease Risk Table                     Men   Women  1/2 Average Risk   3.4   3.3  Average Risk       5.0   4.4  2 X Average Risk   9.6   7.1  3 X Average Risk  23.4   11.0        Use the calculated Patient Ratio above and the CHD Risk Table to determine the patient's CHD Risk.        ATP III CLASSIFICATION (LDL):  <100     mg/dL   Optimal  100-129  mg/dL   Near or Above                    Optimal  130-159  mg/dL   Borderline  160-189  mg/dL  High  >190     mg/dL   Very High Performed at [facility], [address] [place], [place] [address]   Hemoglobin A1c      Status: Abnormal   Collection Time: 11/04/20  9:28 AM  Result Value Ref Range   Hgb A1c MFr Bld 5.8 (H) 4.8 - 5.6 %    Comment: (NOTE) Pre diabetes:          5.7%-6.4%  Diabetes:              >6.4%  Glycemic control for   <7.0% adults with diabetes    Mean Plasma Glucose 119.76 mg/dL    Comment: Performed at [facility] [address] [place], [place] [address]    Blood Alcohol level:  Lab Results  Component Value Date   [facility] <10 11/01/2020   ETH <10 02/40/9735    Metabolic Disorder Labs: Lab Results  Component Value Date   HGBA1C 5.8 (H) 11/04/2020   MPG 119.76 11/04/2020   MPG 114.02 06/06/2020   No results found for: PROLACTIN Lab Results  Component Value Date   CHOL 133 11/04/2020   TRIG 140 11/04/2020   HDL 34 (L) 11/04/2020   CHOLHDL 3.9 11/04/2020   VLDL 28 11/04/2020   LDLCALC 71 11/04/2020   LDLCALC 48 06/06/2020    Physical Findings: AIMS:  , ,  ,  ,    CIWA:    COWS:     Musculoskeletal: Strength & Muscle Tone: within normal limits Gait & Station: normal Patient leans: N/A  Psychiatric Specialty Exam:  Presentation  General Appearance: Appropriate for Environment; Casual  Eye Contact:Good  Speech:Clear and Coherent; Normal Rate  Speech Volume:Normal  Handedness:Right   Mood and Affect  Mood:Irritable  Affect:Congruent   Thought Process  Thought Processes:Goal Directed; Coherent  Descriptions of Associations:Intact  Orientation:Full (Time, Place and Person)  Thought Content:Logical; Perseveration  History of Schizophrenia/Schizoaffective disorder:No  Duration of Psychotic Symptoms:Less than six months  Hallucinations:Denies  Ideas of Reference:None  Suicidal Thoughts:Intermittent passive SI  Homicidal Thoughts:Intermittent HI towards staff   Sensorium  Memory:Immediate Good; Recent Good; Remote Good  Judgment:Impaired  Insight:Shallow   Executive Functions  Concentration:Fair  Attention  Span:Fair  [place] of Knowledge:Good  Language:Good   Psychomotor Activity  Psychomotor Activity:No data recorded   Assets  Assets:Communication Skills; Desire for Improvement; Resilience; Social Support; Talents/Skills   Sleep  Sleep:Fair, 7    Physical Exam: Physical Exam ROS Blood pressure 137/86, pulse 65, temperature (!) 97.5 F (36.4 C), temperature source Oral, resp. rate 18, height 6\' 1"  (1.854 m), weight 49.9 kg, SpO2 100 %. Body mass index is 14.51 kg/m.   Treatment Plan Summary: Daily contact with patient to assess and evaluate symptoms and progress in treatment and Medication management 60 year old male presenting with worsening mood in the context of medication noncompliance and relapse on cocaine.  Continue aspirin 81 mg daily for history of CVA.  Continue Lipitor 80 mg every evening for hyperlipidemia.  Continue Celexa 10 mg daily and Seroquel 200 mg at bedtime for mood.  Flexeril 5 mg at bedtime and gabapentin 400 mg 3 times daily for neuropathic pain.  Increase lisinopril 10 mg daily for hypertension.  Protonix 40 mg twice daily with meals and outpatient follow-up for acid reflux. Continue Compazine 5 mg Q6hr PRN for migraine.  [name], MD 11/05/2020, 11:36 AM

## 2020-11-05 NOTE — Progress Notes (Signed)
Patient has been isolative to his room since beginning of the shift. Did come out for snack and medications. He has complaint of chronic back pain this evening for which medication was provided. He denies SI/HI/AVH depression and anxiety at this encounter. He was med compliant and received his medication without issue.  Will continue to monitor with Q15 minute safety checks.  Cleo Butler-Nicholson, LPN

## 2020-11-05 NOTE — Plan of Care (Signed)
Angry and irritable. Denies SI/HI. Mostly in room. Taking medications and eating well.

## 2020-11-05 NOTE — Progress Notes (Signed)
Recreation Therapy Notes   Date: 11/05/2020  Time: 10:15 am   Location: Courtyard     Behavioral response: N/A   Intervention Topic: Wellness   Discussion/Intervention: Patient did not attend group.   Clinical Observations/Feedback:  Patient did not attend group.   Afomia Blackley LRT/CTRS        Brianni Manthe 11/05/2020 11:13 AM

## 2020-11-05 NOTE — BHH Group Notes (Signed)
LCSW Group Therapy Note     11/05/2020 2:22 PM     Type of Therapy and Topic:  Group Therapy:  Feelings around Relapse and Recovery     Participation Level:  Did Not Attend     Description of Group:    Patients in this group will discuss emotions they experience before and after a relapse. They will process how experiencing these feelings, or avoidance of experiencing them, relates to having a relapse. Facilitator will guide patients to explore emotions they have related to recovery. Patients will be encouraged to process which emotions are more powerful. They will be guided to discuss the emotional reaction significant others in their lives may have to their relapse or recovery. Patients will be assisted in exploring ways to respond to the emotions of others without this contributing to a relapse.     Therapeutic Goals:  1.    Patient will identify two or more emotions that lead to a relapse for them  2.    Patient will identify two emotions that result when they relapse  3.    Patient will identify two emotions related to recovery  4.    Patient will demonstrate ability to communicate their needs through discussion and/or role plays        Summary of Patient Progress:   X   Therapeutic Modalities:   Cognitive Behavioral Therapy  Solution-Focused Therapy  Assertiveness Training  Relapse Prevention Therapy        Lyle Leisner [place], MSW, [place]  11/05/2020 2:22 PM

## 2020-11-06 NOTE — BHH Group Notes (Signed)
[place] Group Notes: (Clinical Social Work)   11/06/2020      Type of Therapy:  Group Therapy   Participation Level:  Did Not Attend - was invited individually by Nurse/MHT and chose not to attend.   Raina Mina, [place] 11/06/2020  2:53 PM

## 2020-11-06 NOTE — Progress Notes (Signed)
[facility] MD Progress Note  11/06/2020 3:19 PM Samuel Molina  MRN:  [id number]  CC "ok"  Subjective: 60 year old male with history of mood problems and substance abuse presenting for suicidal ideations and homicidal ideations.    Staff reported No acute overnight events.  Tamre Cass refused 2nd dose of gabapentin, otherwise medication compliant, attending to ADLs.    Today, Usher Hedberg said that Keneisha Heckart hopes that one of the sober living place would take him tomorrow, in which cause Matheo Rathbone wants to be discharged today.  Terryn Rosenkranz is encouraged to wait till there is an bed offer.  Otherwise, Tannah Dreyfuss reports feeling ok.  Vaguely denied SI.    Chiyoko Torrico did not want to attend groups.   Principal Problem: Severe recurrent major depression without psychotic features ([place]) Diagnosis: Principal Problem:   Severe recurrent major depression without psychotic features ([place]) Active Problems:   Gastroesophageal reflux disease with esophagitis   Cocaine abuse ([place])   Substance induced mood disorder (HCC)   HTN (hypertension)   HLD (hyperlipidemia)  Total Time spent with patient: 20 minutes  Past Psychiatric History: See H&P  Past Medical History:  Past Medical History:  Diagnosis Date   Anxiety    Depression    GERD (gastroesophageal reflux disease)    Obesity    Stroke ([place])    Substance abuse ([place])     Past Surgical History:  Procedure Laterality Date   ESOPHAGOGASTRODUODENOSCOPY (EGD) WITH PROPOFOL N/A 09/20/2017   Procedure: ESOPHAGOGASTRODUODENOSCOPY (EGD) WITH PROPOFOL;  Surgeon: Lin Landsman, MD;  Location: [facility] ENDOSCOPY;  Service: Gastroenterology;  Laterality: N/A;   KNEE SURGERY Right    Family History:  Family History  Problem Relation Age of Onset   Vasculitis Mother    Family Psychiatric  History: See H&P Social History:  Social History   Substance and Sexual Activity  Alcohol Use Not Currently     Social History   Substance and Sexual Activity  Drug Use Yes   Types: Cocaine    Social History    Socioeconomic History   Marital status: Single    Spouse name: Not on file   Number of children: Not on file   Years of education: Not on file   Highest education level: Not on file  Occupational History   Not on file  Tobacco Use   Smoking status: Every Day    Packs/day: 0.50    Types: Cigarettes   Smokeless tobacco: Never  Vaping Use   Vaping Use: Never used  Substance and Sexual Activity   Alcohol use: Not Currently   Drug use: Yes    Types: Cocaine   Sexual activity: Not Currently  Other Topics Concern   Not on file  Social History Narrative   Not on file   Social Determinants of Health   Financial Resource Strain: Not on file  Food Insecurity: Not on file  Transportation Needs: Not on file  Physical Activity: Not on file  Stress: Not on file  Social Connections: Not on file   Additional Social History:   Sleep: Poor  Appetite:  Fair  Current Medications: Current Facility-Administered Medications  Medication Dose Route Frequency Provider Last Rate Last Admin   acetaminophen (TYLENOL) tablet 650 mg  650 mg Oral Q6H PRN Clapacs, John T, MD       alum & mag hydroxide-simeth (MAALOX/MYLANTA) 200-200-20 MG/5ML suspension 30 mL  30 mL Oral Q4H PRN Clapacs, John T, MD       aspirin EC tablet 81 mg  81 mg Oral  Daily Clapacs, [name], MD   81 mg at 11/06/20 0801   atorvastatin (LIPITOR) tablet 80 mg  80 mg Oral QPM Clapacs, [name], MD   80 mg at 11/05/20 1754   citalopram (CELEXA) tablet 10 mg  10 mg Oral Daily [name], MD   10 mg at 11/06/20 0801   cyclobenzaprine (FLEXERIL) tablet 5 mg  5 mg Oral QHS [name], MD   5 mg at 11/05/20 2041   diclofenac Sodium (VOLTAREN) 1 % topical gel 4 g  4 g Topical QID Clapacs, [name], MD   4 g at 11/06/20 0801   gabapentin (NEURONTIN) capsule 400 mg  400 mg Oral TID Clapacs, John T, MD   400 mg at 11/06/20 0801   lisinopril (ZESTRIL) tablet 10 mg  10 mg Oral Daily [name], MD   10 mg at 11/06/20 0800    magnesium hydroxide (MILK OF MAGNESIA) suspension 30 mL  30 mL Oral Daily PRN Clapacs, [name], MD       meloxicam (MOBIC) tablet 7.5 mg  7.5 mg Oral Daily [name] M, MD   7.5 mg at 11/06/20 0801   pantoprazole (PROTONIX) EC tablet 40 mg  40 mg Oral BID AC [name], MD   40 mg at 11/06/20 0801   prochlorperazine (COMPAZINE) tablet 5 mg  5 mg Oral Q6H PRN [name], MD       QUEtiapine (SEROQUEL) tablet 200 mg  200 mg Oral QHS [name], MD   200 mg at 11/05/20 2041    Lab Results:  No results found for this or any previous visit (from the past 59 hour(s)).   Blood Alcohol level:  Lab Results  Component Value Date   ETH <10 11/01/2020   ETH <10 31/54/0086    Metabolic Disorder Labs: Lab Results  Component Value Date   HGBA1C 5.8 (H) 11/04/2020   MPG 119.76 11/04/2020   MPG 114.02 06/06/2020   No results found for: PROLACTIN Lab Results  Component Value Date   CHOL 133 11/04/2020   TRIG 140 11/04/2020   HDL 34 (L) 11/04/2020   CHOLHDL 3.9 11/04/2020   VLDL 28 11/04/2020   LDLCALC 71 11/04/2020   LDLCALC 48 06/06/2020    Physical Findings: AIMS:  , ,  ,  ,    CIWA:    COWS:     Musculoskeletal: Strength & Muscle Tone: within normal limits Gait & Station: normal Patient leans: N/A  Psychiatric Specialty Exam:  Presentation  General Appearance: Appropriate for Environment; Casual  Eye Contact:Good  Speech:Clear and Coherent; Normal Rate  Speech Volume:Normal  Handedness:Right   Mood and Affect  Mood:Irritable  Affect:Congruent   Thought Process  Thought Processes:Goal Directed; Coherent  Descriptions of Associations:Intact  Orientation:Full (Time, Place and Person)  Thought Content:Logical; Perseveration  History of Schizophrenia/Schizoaffective disorder:No  Duration of Psychotic Symptoms:Less than six months  Hallucinations:Denies  Ideas of Reference:None  Suicidal Thoughts:Intermittent passive SI  Homicidal  Thoughts:Intermittent HI towards staff   Sensorium  Memory:Immediate Good; Recent Good; Remote Good  Judgment:Impaired  Insight:Shallow   Executive Functions  Concentration:Fair  Attention Span:Fair  [place] of Knowledge:Good  Language:Good   Psychomotor Activity  Psychomotor Activity:No data recorded   Assets  Assets:Communication Skills; Desire for Improvement; Resilience; Social Support; Talents/Skills  Sleep  Sleep:Fair  Physical Exam: Physical Exam ROS Blood pressure (!) 172/90, pulse (!) 51, temperature 97.6 F (36.4 C), temperature source Oral, resp. rate  18, height 6\' 1"  (1.854 m), weight 49.9 kg, SpO2 100 %. Body mass index is 14.51 kg/m.   Treatment Plan Summary: Daily contact with patient to assess and evaluate symptoms and progress in treatment and Medication management   60 year old male presenting with worsening mood in the context of medication noncompliance and relapse on cocaine.    Substance induced mood disorder (cocaine use disorder)  -- Continue Celexa 10 mg daily  -- Seroquel 200 mg at bedtime for mood.  Cocaine use disorder -- pt is interested in substance abuse residential treatment.   Medical conditions -- Continue aspirin 81 mg daily for history of CVA.   -- Continue Lipitor 80 mg every evening for hyperlipidemia.     -- Flexeril 5 mg at bedtime and gabapentin 400 mg 3 times daily for neuropathic pain.   -- continue Lisinopril 10 mg daily for hypertension.   -- Protonix 40 mg twice daily with meals and outpatient follow-up for acid reflux.  -- Continue Compazine 5 mg Q6hr PRN for migraine.  Kazzandra Desaulniers, MD 11/06/2020, 3:19 PM

## 2020-11-06 NOTE — Plan of Care (Signed)
  Problem: Medication: Goal: Compliance with prescribed medication regimen will improve Outcome: Progressing   Problem: Self-Concept: Goal: Ability to disclose and discuss suicidal ideas will improve Outcome: Progressing Goal: Will verbalize positive feelings about self Outcome: Progressing

## 2020-11-06 NOTE — Progress Notes (Signed)
I assumed care for Samuel Molina at about 1900.  He was resting in his bed, reported having a headache rated as 8 out of 10 but declined prns pain meds when offered. He was out of room only for snacks, otherwise has been self isolative.He has been medication compliant. No behavioral problems thus far, he declined diclofenac gel. He is resting in bed at this time, being monitored as ordered. He denied any avh/hi/si.

## 2020-11-06 NOTE — Plan of Care (Signed)
Irritable and angry. Complaining about his medication regimen and stated "who gave you the authorization to give me blood pressure medicine ?" Patient is guarded and isolative in room. Safety precautions reinforced.

## 2020-11-07 DIAGNOSIS — I10 Essential (primary) hypertension: Secondary | ICD-10-CM

## 2020-11-07 DIAGNOSIS — H9210 Otorrhea, unspecified ear: Secondary | ICD-10-CM | POA: Diagnosis present

## 2020-11-07 DIAGNOSIS — H9213 Otorrhea, bilateral: Secondary | ICD-10-CM

## 2020-11-07 DIAGNOSIS — E785 Hyperlipidemia, unspecified: Secondary | ICD-10-CM

## 2020-11-07 DIAGNOSIS — K2100 Gastro-esophageal reflux disease with esophagitis, without bleeding: Secondary | ICD-10-CM

## 2020-11-07 LAB — BASIC METABOLIC PANEL
Anion gap: 4 — ABNORMAL LOW (ref 5–15)
BUN: 16 mg/dL (ref 6–20)
CO2: 26 mmol/L (ref 22–32)
Calcium: 9.2 mg/dL (ref 8.9–10.3)
Chloride: 107 mmol/L (ref 98–111)
Creatinine, Ser: 0.75 mg/dL (ref 0.61–1.24)
GFR, Estimated: >60 mL/min (ref >60)
Glucose, Bld: 108 mg/dL — ABNORMAL HIGH (ref 70–99)
Potassium: 4 mmol/L (ref 3.5–5.1)
Sodium: 137 mmol/L (ref 135–145)

## 2020-11-07 LAB — CBC
HCT: 44.9 % (ref 39.0–52.0)
Hemoglobin: 14.3 g/dL (ref 13.0–17.0)
MCH: 26.3 pg (ref 26.0–34.0)
MCHC: 31.8 g/dL (ref 30.0–36.0)
MCV: 82.5 fL (ref 80.0–100.0)
Platelets: 297 10*3/uL (ref 150–400)
RBC: 5.44 MIL/uL (ref 4.22–5.81)
RDW: 15 % (ref 11.5–15.5)
WBC: 8.3 10*3/uL (ref 4.0–10.5)
nRBC: 0 % (ref 0.0–0.2)

## 2020-11-07 MED ORDER — LISINOPRIL 20 MG PO TABS
20.0000 mg | ORAL_TABLET | Freq: Every day | ORAL | Status: DC
  Administered 2020-11-08 – 2020-11-11 (×4): 20 mg via ORAL
  Filled 2020-11-07 (×2): qty 4
  Filled 2020-11-07 (×2): qty 1

## 2020-11-07 MED ORDER — HYDRALAZINE HCL 25 MG PO TABS
25.0000 mg | ORAL_TABLET | Freq: Four times a day (QID) | ORAL | Status: DC | PRN
  Filled 2020-11-07: qty 1

## 2020-11-07 MED ORDER — AMOXICILLIN-POT CLAVULANATE 875-125 MG PO TABS
1.0000 | ORAL_TABLET | Freq: Two times a day (BID) | ORAL | Status: DC
  Administered 2020-11-07 – 2020-11-08 (×3): 1 via ORAL
  Filled 2020-11-07 (×3): qty 1

## 2020-11-07 MED ORDER — HYDRALAZINE HCL 25 MG PO TABS
25.0000 mg | ORAL_TABLET | Freq: Three times a day (TID) | ORAL | Status: DC | PRN
  Filled 2020-11-07: qty 1

## 2020-11-07 MED ORDER — QUETIAPINE FUMARATE 200 MG PO TABS
300.0000 mg | ORAL_TABLET | Freq: Every day | ORAL | Status: DC
  Administered 2020-11-07 – 2020-11-10 (×4): 300 mg via ORAL
  Filled 2020-11-07 (×4): qty 1

## 2020-11-07 NOTE — Plan of Care (Signed)
Patient compliant with medication administration per MD orders  Problem: Medication: Goal: Compliance with prescribed medication regimen will improve Outcome: Progressing

## 2020-11-07 NOTE — [facility] Group Notes (Signed)
[place] Group Notes:  (Nursing/MHT/Case Management/Adjunct)  Date:  11/07/2020  Time:  9:25 PM  Type of Therapy:  Group Therapy  Participation Level:  Did Not Attend   Summary of Progress/Problems:  Samuel Molina 11/07/2020, 9:25 PM

## 2020-11-07 NOTE — Plan of Care (Signed)
  Problem: Medication: Goal: Compliance with prescribed medication regimen will improve Outcome: Progressing   Problem: Self-Concept: Goal: Ability to disclose and discuss suicidal ideas will improve Outcome: Progressing Goal: Will verbalize positive feelings about self Outcome: Progressing

## 2020-11-07 NOTE — [facility] Group Notes (Addendum)
[place] Group Notes: (Clinical Social Work)   11/07/2020      Type of Therapy:  Group Therapy   Participation Level:  Did Not Attend despite MHT prompting.   Kenna Gilbert [employer], [place] 11/07/2020 2:35 PM

## 2020-11-07 NOTE — Progress Notes (Signed)
[facility] MD Progress Note  11/07/2020 12:19 PM Samuel Molina  MRN:  [id number]  CC "terrible!"  Subjective: 60 year old male with history of mood problems and substance abuse presenting for suicidal ideations and homicidal ideations.    Staff reported No acute overnight events.  Samuel Molina refused 2nd dose of gabapentin, otherwise medication compliant, attending to ADLs.    Today, Samuel Molina said that his L eye is drooping (chronic) and blurry and "the fluid is coming out of my ear!" Which Samuel Molina stated that it is just started. Samuel Molina requested to have a medical doctor check him up.  Of note, Samuel Molina appears to be comfortable, not in distress.  RN stated that Samuel Molina has had multiple somatic complaints.   Otherwise, Samuel Molina said that his mood is ok. But complained not sleeping at night and wants to increase his seroquel.     Samuel Molina did not want to attend groups.   Principal Problem: Severe recurrent major depression without psychotic features ([place]) Diagnosis: Principal Problem:   Severe recurrent major depression without psychotic features ([place]) Active Problems:   Gastroesophageal reflux disease with esophagitis   Cocaine abuse ([place])   Substance induced mood disorder (HCC)   HTN (hypertension)   HLD (hyperlipidemia)   Ear drainage  Total Time spent with patient: 20 minutes  Past Psychiatric History: See H&P  Past Medical History:  Past Medical History:  Diagnosis Date   Anxiety    Depression    GERD (gastroesophageal reflux disease)    Obesity    Stroke ([place])    Substance abuse ([place])     Past Surgical History:  Procedure Laterality Date   ESOPHAGOGASTRODUODENOSCOPY (EGD) WITH PROPOFOL N/A 09/20/2017   Procedure: ESOPHAGOGASTRODUODENOSCOPY (EGD) WITH PROPOFOL;  Surgeon: Lin Landsman, MD;  Location: [facility] ENDOSCOPY;  Service: Gastroenterology;  Laterality: N/A;   KNEE SURGERY Right    Family History:  Family History  Problem Relation Age of Onset   Vasculitis Mother    Family Psychiatric  History: See  H&P Social History:  Social History   Substance and Sexual Activity  Alcohol Use Not Currently     Social History   Substance and Sexual Activity  Drug Use Yes   Types: Cocaine    Social History   Socioeconomic History   Marital status: Single    Spouse name: Not on file   Number of children: Not on file   Years of education: Not on file   Highest education level: Not on file  Occupational History   Not on file  Tobacco Use   Smoking status: Every Day    Packs/day: 0.50    Types: Cigarettes   Smokeless tobacco: Never  Vaping Use   Vaping Use: Never used  Substance and Sexual Activity   Alcohol use: Not Currently   Drug use: Yes    Types: Cocaine   Sexual activity: Not Currently  Other Topics Concern   Not on file  Social History Narrative   Not on file   Social Determinants of Health   Financial Resource Strain: Not on file  Food Insecurity: Not on file  Transportation Needs: Not on file  Physical Activity: Not on file  Stress: Not on file  Social Connections: Not on file   Additional Social History:   Sleep: Poor  Appetite:  Fair  Current Medications: Current Facility-Administered Medications  Medication Dose Route Frequency Provider Last Rate Last Admin   acetaminophen (TYLENOL) tablet 650 mg  650 mg Oral Q6H PRN Clapacs, [name], MD  alum & mag hydroxide-simeth (MAALOX/MYLANTA) 200-200-20 MG/5ML suspension 30 mL  30 mL Oral Q4H PRN Clapacs, [name], MD       amoxicillin-clavulanate (AUGMENTIN) 875-125 MG per tablet 1 tablet  1 tablet Oral Q12H Ivor Costa, MD   1 tablet at 11/07/20 1204   aspirin EC tablet 81 mg  81 mg Oral Daily Clapacs, [name], MD   81 mg at 11/07/20 0813   atorvastatin (LIPITOR) tablet 80 mg  80 mg Oral QPM Clapacs, John T, MD   80 mg at 11/06/20 1645   citalopram (CELEXA) tablet 10 mg  10 mg Oral Daily [name], MD   10 mg at 11/07/20 0813   cyclobenzaprine (FLEXERIL) tablet 5 mg  5 mg Oral QHS [name], MD   5  mg at 11/06/20 2053   diclofenac Sodium (VOLTAREN) 1 % topical gel 4 g  4 g Topical QID Clapacs, [name], MD   4 g at 11/06/20 0801   gabapentin (NEURONTIN) capsule 400 mg  400 mg Oral TID Clapacs, [name], MD   400 mg at 11/07/20 1204   hydrALAZINE (APRESOLINE) tablet 25 mg  25 mg Oral QID PRN Ivor Costa, MD       Derrill Memo ON 11/08/2020] lisinopril (ZESTRIL) tablet 20 mg  20 mg Oral Daily Ivor Costa, MD       magnesium hydroxide (MILK OF MAGNESIA) suspension 30 mL  30 mL Oral Daily PRN Clapacs, [name], MD       meloxicam (MOBIC) tablet 7.5 mg  7.5 mg Oral Daily [name] M, MD   7.5 mg at 11/07/20 0813   pantoprazole (PROTONIX) EC tablet 40 mg  40 mg Oral BID AC [name], MD   40 mg at 11/07/20 0813   prochlorperazine (COMPAZINE) tablet 5 mg  5 mg Oral Q6H PRN [name], MD       QUEtiapine (SEROQUEL) tablet 200 mg  200 mg Oral QHS [name], MD   200 mg at 11/06/20 2053    Lab Results:  Results for orders placed or performed during the hospital encounter of 11/02/20 (from the past 48 hour(s))  CBC     Status: None   Collection Time: 11/07/20 10:55 AM  Result Value Ref Range   WBC 8.3 4.0 - 10.5 K/uL   RBC 5.44 4.22 - 5.81 MIL/uL   Hemoglobin 14.3 13.0 - 17.0 g/dL   HCT 44.9 39.0 - 52.0 %   MCV 82.5 80.0 - 100.0 fL   MCH 26.3 26.0 - 34.0 pg   MCHC 31.8 30.0 - 36.0 g/dL   RDW 15.0 11.5 - 15.5 %   Platelets 297 150 - 400 K/uL   nRBC 0.0 0.0 - 0.2 %    Comment: Performed at [facility], [address], [place], [place] [address]  Basic metabolic panel     Status: Abnormal   Collection Time: 11/07/20 10:55 AM  Result Value Ref Range   Sodium 137 135 - 145 mmol/L   Potassium 4.0 3.5 - 5.1 mmol/L   Chloride 107 98 - 111 mmol/L   CO2 26 22 - 32 mmol/L   Glucose, Bld 108 (H) 70 - 99 mg/dL    Comment: Glucose reference range applies only to samples taken after fasting for at least 8 hours.   BUN 16 6 - 20 mg/dL   Creatinine, Ser 0.75 0.61 - 1.24 mg/dL    Calcium 9.2 8.9 - 10.3 mg/dL  GFR, Estimated >60 >60 mL/min    Comment: (NOTE) Calculated using the CKD-EPI Creatinine Equation (2021)    Anion gap 4 (L) 5 - 15    Comment: Performed at [facility], [address] [place], [place] [address]     Blood Alcohol level:  Lab Results  Component Value Date   [facility] <10 11/01/2020   ETH <10 33/38/3291    Metabolic Disorder Labs: Lab Results  Component Value Date   HGBA1C 5.8 (H) 11/04/2020   MPG 119.76 11/04/2020   MPG 114.02 06/06/2020   No results found for: PROLACTIN Lab Results  Component Value Date   CHOL 133 11/04/2020   TRIG 140 11/04/2020   HDL 34 (L) 11/04/2020   CHOLHDL 3.9 11/04/2020   VLDL 28 11/04/2020   LDLCALC 71 11/04/2020   LDLCALC 48 06/06/2020    Physical Findings: AIMS:  , ,  ,  ,    CIWA:    COWS:     Musculoskeletal: Strength & Muscle Tone: within normal limits Gait & Station: normal Patient leans: N/A  Psychiatric Specialty Exam:  Presentation  General Appearance: Appropriate for Environment; Casual  Eye Contact:Good  Speech:Clear and Coherent; Normal Rate  Speech Volume:Normal  Handedness:Right   Mood and Affect  Mood:Irritable  Affect:Congruent   Thought Process  Thought Processes:Goal Directed; Coherent  Descriptions of Associations:Intact  Orientation:Full (Time, Place and Molina)  Thought Content:Logical; Perseveration  History of Schizophrenia/Schizoaffective disorder:No  Duration of Psychotic Symptoms:Less than six months  Hallucinations:Denies  Ideas of Reference:None  Suicidal Thoughts:Intermittent passive SI  Homicidal Thoughts:Intermittent HI towards staff   Sensorium  Memory:Immediate Good; Recent Good; Remote Good  Judgment:Impaired  Insight:Shallow   Executive Functions  Concentration:Fair  Attention Span:Fair  [place] of Knowledge:Good  Language:Good   Psychomotor Activity  Psychomotor Activity:No data  recorded   Assets  Assets:Communication Skills; Desire for Improvement; Resilience; Social Support; Talents/Skills  Sleep  Sleep:Fair  Physical Exam: Physical Exam ROS Blood pressure (!) 158/92, pulse (!) 51, temperature (!) 97.5 F (36.4 C), temperature source Oral, resp. rate 18, height 6\' 1"  (1.854 m), weight 49.9 kg, SpO2 100 %. Body mass index is 14.51 kg/m.   Treatment Plan Summary: Daily contact with patient to assess and evaluate symptoms and progress in treatment and Medication management   60 year old male presenting with worsening mood in the context of medication noncompliance and relapse on cocaine.    Substance induced mood disorder (cocaine use disorder)  -- Continue Celexa 10 mg daily  -- Increase Seroquel to 300 mg at bedtime for mood.  Cocaine use disorder -- pt is interested in substance abuse residential treatment.   Medical conditions -- Continue aspirin 81 mg daily for history of CVA.   -- Continue Lipitor 80 mg every evening for hyperlipidemia.     -- Flexeril 5 mg at bedtime and gabapentin 400 mg 3 times daily for neuropathic pain.   -- continue Lisinopril 10 mg daily for hypertension.  His BP is uncontrolled.  Given history of stroke, Medical Consult to the hospitalist is ordered.  Spoke with Dr. Blaine Hamper, Soledad Gerlach who plans to see the patient today and make adjustment to his meds, and workup if necessary.  -- Protonix 40 mg twice daily with meals and outpatient follow-up for acid reflux.  -- Continue Compazine 5 mg Q6hr PRN for migraine.  Micaiah Remillard, MD 11/07/2020, 12:19 PM

## 2020-11-07 NOTE — Plan of Care (Signed)
Patient's mood is slightly improving. Cooperative and less agitated. Remains guarded on approach. Reported to MD that he is experiencing pain in his ear. Reported that he saw fluid coming out of his ear. Hospitalist was contacted for consult.

## 2020-11-07 NOTE — Progress Notes (Signed)
Patient pleasant and cooperative. Denies SI, HI, AVH. Medication compliant. Appropriate with staff and peers. Noted in dayroom socializing and watching movies with peers. Encouragement and support provided. Safety checks maintained. Medications given as prescribed. Pt receptive and remains safe on unit with q 15 min checks.

## 2020-11-07 NOTE — Consult Note (Addendum)
Medical Consultation   Samuel Molina  YJE:[id number]  DOB: 03/28/1961  DOA: 11/02/2020  PCP: Merryl Hacker, No   Outpatient Specialists:    Requesting physician: -Dr.  He of [facility]  Reason for consultation: - Ear drainage and elevated Bp    History of Present Illness: Samuel Molina is an 60 y.o. male with PMH of hypertension, stroke, depression, anxiety, GERD, substance abuse, Bell's palsy, cocaine abuse, tobacco abuse, malingering, admitted to behavioral health on 7/13 due to depression. We are asked to consult due to elevated blood pressure and ear drainage.  Per Dr. He, pt is admitted to [facility] units due to depression. Patient blood pressure is not well controlled. Pt reports bilateral ear drainage with clear fluid. Has minimal ear pain. No fever or chills. No hearing loss. He is taking lisinopril 10 mg daily. His Bp is 158/92 today.  Patient does not have any chest pain, shortness breath and cough.  No nausea, vomiting, diarrhea or abdominal pain.  Patient has acid reflux symptoms and burping.  No symptoms of UTI.  Patient denies homicidal or suicidal ideations currently.  Lab: WBC 8.3, electrolytes renal function okay, patient had negative COVID test on 11/01/2020.   Review of Systems:  General: no fevers, chills, no changes in body weight, no changes in appetite Skin: no rash HEENT: no blurry vision, hearing changes or sore throat.  Has bilateral ear drainage and minimal ear pain. Pulm: no dyspnea, coughing, wheezing CV: no chest pain, palpitations, shortness of breath Abd: no nausea/vomiting, abdominal pain, diarrhea/constipation GU: no dysuria, hematuria, polyuria Ext: no arthralgias, myalgias Neuro: no weakness, numbness, or tingling Psychiatry: No suicidal or homicidal ideations.    Past Medical History: Past Medical History:  Diagnosis Date   Anxiety    Depression    GERD (gastroesophageal reflux disease)    Obesity    Stroke ([place])    Substance abuse  ([place])     Past Surgical History: Past Surgical History:  Procedure Laterality Date   ESOPHAGOGASTRODUODENOSCOPY (EGD) WITH PROPOFOL N/A 09/20/2017   Procedure: ESOPHAGOGASTRODUODENOSCOPY (EGD) WITH PROPOFOL;  Surgeon: Lin Landsman, MD;  Location: [facility] ENDOSCOPY;  Service: Gastroenterology;  Laterality: N/A;   KNEE SURGERY Right      Allergies:   Allergies  Allergen Reactions   Acetaminophen Itching, Nausea And Vomiting and Rash     Social History:  reports that he has been smoking cigarettes. He has been smoking an average of .5 packs per day. He has never used smokeless tobacco. He reports previous alcohol use. He reports current drug use. Drug: Cocaine.   Family History: Family History  Problem Relation Age of Onset   Vasculitis Mother    Physical Exam: Vitals:   11/05/20 0634 11/06/20 0636 11/06/20 0800 11/07/20 0633  BP: 137/86 (!) 172/90 (!) 172/90 (!) 158/92  Pulse: 65 (!) 51  (!) 51  Resp:      Temp: (!) 97.5 F (36.4 C) 97.6 F (36.4 C)  (!) 97.5 F (36.4 C)  TempSrc: Oral Oral  Oral  SpO2: 100% 100%  100%  Weight:      Height:        General: Not in acute distress HEENT: PERRL, EOMI, no scleral icterus, No JVD or bruit. Has a little clear liquid drainage from left ear, no drainage from right ear, no obvious abnormalities of tympanic membrane. Cardiac: S1/S2, RRR, No murmurs, gallops or rubs Pulm: No rales, wheezing, rhonchi or  rubs. Abd: Soft, nondistended, nontender, no rebound pain, no organomegaly, BS present Ext: No edema. 2+DP/PT pulse bilaterally Musculoskeletal: No joint deformities, erythema, or stiffness, ROM full Skin: No rashes.  Neuro: Alert and oriented X3, cranial nerves II-XII grossly intact. Psych: no suicidal or hemocidal ideation.       Data reviewed:  I have personally reviewed following labs and imaging studies Labs:  CBC: Recent Labs  Lab 11/01/20 1420 11/07/20 1055  WBC 7.5 8.3  HGB 15.3 14.3  HCT 46.6 44.9   MCV 80.8 82.5  PLT 320 481    Basic Metabolic Panel: Recent Labs  Lab 11/01/20 1420 11/07/20 1055  NA 136 137  K 4.0 4.0  CL 103 107  CO2 24 26  GLUCOSE 156* 108*  BUN 21* 16  CREATININE 1.08 0.75  CALCIUM 9.3 9.2   GFR Estimated Creatinine Clearance: 71 mL/min (by C-G formula based on SCr of 0.75 mg/dL). Liver Function Tests: Recent Labs  Lab 11/01/20 1420  AST 28  ALT 17  ALKPHOS 98  BILITOT 1.2  PROT 8.4*  ALBUMIN 4.6   No results for input(s): LIPASE, AMYLASE in the last 168 hours. No results for input(s): AMMONIA in the last 168 hours. Coagulation profile No results for input(s): INR, PROTIME in the last 168 hours.  Cardiac Enzymes: No results for input(s): CKTOTAL, CKMB, CKMBINDEX, TROPONINI in the last 168 hours. BNP: Invalid input(s): POCBNP CBG: No results for input(s): GLUCAP in the last 168 hours. D-Dimer No results for input(s): DDIMER in the last 72 hours. Hgb A1c No results for input(s): HGBA1C in the last 72 hours. Lipid Profile No results for input(s): CHOL, HDL, LDLCALC, TRIG, CHOLHDL, LDLDIRECT in the last 72 hours. Thyroid function studies No results for input(s): TSH, T4TOTAL, T3FREE, THYROIDAB in the last 72 hours.  Invalid input(s): FREET3 Anemia work up No results for input(s): VITAMINB12, FOLATE, FERRITIN, TIBC, IRON, RETICCTPCT in the last 72 hours. Urinalysis    Component Value Date/Time   COLORURINE YELLOW 07/31/2020 0412   APPEARANCEUR CLEAR 07/31/2020 0412   LABSPEC 1.028 07/31/2020 0412   PHURINE 5.0 07/31/2020 0412   GLUCOSEU NEGATIVE 07/31/2020 0412   HGBUR NEGATIVE 07/31/2020 0412   BILIRUBINUR NEGATIVE 07/31/2020 0412   KETONESUR 5 (A) 07/31/2020 0412   PROTEINUR 30 (A) 07/31/2020 0412   NITRITE NEGATIVE 07/31/2020 0412   LEUKOCYTESUR NEGATIVE 07/31/2020 0412     Microbiology Recent Results (from the past 240 hour(s))  SARS CORONAVIRUS 2 (TAT 6-24 HRS) Nasopharyngeal Nasopharyngeal Swab     Status: None    Collection Time: 11/01/20  5:17 PM   Specimen: Nasopharyngeal Swab  Result Value Ref Range Status   SARS Coronavirus 2 NEGATIVE NEGATIVE Final    Comment: (NOTE) SARS-CoV-2 target nucleic acids are NOT DETECTED.  The SARS-CoV-2 RNA is generally detectable in upper and lower respiratory specimens during the acute phase of infection. Negative results do not preclude SARS-CoV-2 infection, do not rule out co-infections with other pathogens, and should not be used as the sole basis for treatment or other patient management decisions. Negative results must be combined with clinical observations, patient history, and epidemiological information. The expected result is Negative.  Fact Sheet for Patients: SugarRoll.be  Fact Sheet for Healthcare Providers: https://www.woods-mathews.com/  This test is not yet approved or cleared by the [place] FDA and  has been authorized for detection and/or diagnosis of SARS-CoV-2 by FDA under an Emergency Use Authorization (EUA). This EUA will remain  in effect (meaning this test can  be used) for the duration of the COVID-19 declaration under Se ction 564(b)(1) of the Act, 21 U.S.C. section 360bbb-3(b)(1), unless the authorization is terminated or revoked sooner.  Performed at [facility], [place] [address], [place], [place] [address]        Inpatient Medications:   Scheduled Meds:  amoxicillin-clavulanate  1 tablet Oral Q12H   aspirin EC  81 mg Oral Daily   atorvastatin  80 mg Oral QPM   citalopram  10 mg Oral Daily   cyclobenzaprine  5 mg Oral QHS   diclofenac Sodium  4 g Topical QID   gabapentin  400 mg Oral TID   [START ON 11/08/2020] lisinopril  20 mg Oral Daily   meloxicam  7.5 mg Oral Daily   pantoprazole  40 mg Oral BID AC   QUEtiapine  200 mg Oral QHS   Continuous Infusions:   Radiological Exams on Admission: No results found.  Impression/Recommendations Principal Problem:    Severe recurrent major depression without psychotic features ([place]) Active Problems:   Gastroesophageal reflux disease with esophagitis   Cocaine abuse (HCC)   Substance induced mood disorder (HCC)   HTN (hypertension)   HLD (hyperlipidemia)   Ear drainage   Severe recurrent major depression without psychotic features and substance induced mood disorder [facility]): Patient is calm currently.  No suicidal or homicidal ideations. -management per primary Dr. Juluis Pitch team  Ear drainage: Etiology is not clear.  Patient has small amount of clear fluid drainage from left ear.  Has mild ear pain.  No fever or chills. -Start Augmentin empirically bid  Gastroesophageal reflux disease with esophagitis:  pt is on Protonix 40 mg twice daily and as needed Mylanta  Cocaine abuse ([place]) -need counseling   HTN (hypertension): Bp 158/92.  Patient is taking lisinopril 10 mg daily -Will increase to 20 mg daily of lisinopril -Add as needed hydralazine 25 mg 3 times daily for SBP >155  HLD (hyperlipidemia) -Lipitor       Thank you for this consultation.  Our [facility] hospitalist team will follow the patient with you.   Time Spent: 20 min  Ivor Costa M.D. [facility] 11/07/2020, 12:08 PM

## 2020-11-08 DIAGNOSIS — F332 Major depressive disorder, recurrent severe without psychotic features: Secondary | ICD-10-CM | POA: Diagnosis not present

## 2020-11-08 MED ORDER — AMOXICILLIN-POT CLAVULANATE 875-125 MG PO TABS
1.0000 | ORAL_TABLET | Freq: Two times a day (BID) | ORAL | Status: DC
  Administered 2020-11-08 – 2020-11-11 (×6): 1 via ORAL
  Filled 2020-11-08 (×5): qty 1

## 2020-11-08 NOTE — Plan of Care (Signed)
Patient has been more and more cooperative and pleasant. Getting involved in group activities and maintaining a positive attitude.

## 2020-11-08 NOTE — Progress Notes (Signed)
Patient pleasant and cooperative with care. Denies SI, HI, AVH. Appropriate with staff and peers. Medications given as prescribed. Pt receptive and remains safe on unit with q 15 min checks.

## 2020-11-08 NOTE — Plan of Care (Signed)
  Problem: Medication: Goal: Compliance with prescribed medication regimen will improve Outcome: Progressing   Problem: Self-Concept: Goal: Ability to disclose and discuss suicidal ideas will improve Outcome: Progressing Goal: Will verbalize positive feelings about self Outcome: Progressing

## 2020-11-08 NOTE — B[facility] Group Notes (Signed)
LCSW Group Therapy Note     11/08/2020 2:13 PM     Type of Therapy and Topic:  Group Therapy:  Overcoming Obstacles     Participation Level:  Did Not Attend     Description of Group:     In this group patients will be encouraged to explore what they see as obstacles to their own wellness and recovery. They will be guided to discuss their thoughts, feelings, and behaviors related to these obstacles. The group will process together ways to cope with barriers, with attention given to specific choices patients can make. Each patient will be challenged to identify changes they are motivated to make in order to overcome their obstacles. This group will be process-oriented, with patients participating in exploration of their own experiences as well as giving and receiving support and challenge from other group members.     Therapeutic Goals:  1.    Patient will identify personal and current obstacles as they relate to admission.  2.    Patient will identify barriers that currently interfere with their wellness or overcoming obstacles.  3.    Patient will identify feelings, thought process and behaviors related to these barriers.  4.    Patient will identify two changes they are willing to make to overcome these obstacles:        Summary of Patient Progress: X     Therapeutic Modalities:    Cognitive Behavioral Therapy  Solution Focused Therapy  Motivational Interviewing  Relapse Prevention Therapy     Earnest Thalman [place], MSW, [place]  11/08/2020 2:13 PM

## 2020-11-08 NOTE — Progress Notes (Signed)
[facility] MD Progress Note  11/08/2020 12:31 PM Samuel Molina  MRN:  [id number]  CC "Still on for Thursday."  Subjective: 60 year old male with history of mood problems and substance abuse presenting for suicidal ideations and homicidal ideations.  No acute overnight events, medication compliant, attending to ADLs.  Patient seen one-on-one again today.  He notes overnight he had a profound feeling of hopelessness with some passive suicidal ideations but felt better after he was able to speak to some fellow patients on the unit.  Over the weekend he also noted some ear drainage which she was started on antibiotics for.  He notes some improvement since being on the antibiotics.  Today he denies any active suicidal ideations, homicidal ideations, visual hallucinations, auditory hallucinations.  He has been accepted to sober living [place] in [place] with earliest bed availability on Thursday.  He has been observed calling sober living [facility] daily to see if any of the [place] locations have earlier bed availability.  At this time he does not feel safe leaving the hospital due to intermittent SI and severe alcohol cravings.  Principal Problem: Severe recurrent major depression without psychotic features ([place]) Diagnosis: Principal Problem:   Severe recurrent major depression without psychotic features ([place]) Active Problems:   Gastroesophageal reflux disease with esophagitis   Cocaine abuse ([place])   Substance induced mood disorder (HCC)   HTN (hypertension)   HLD (hyperlipidemia)   Ear drainage  Total Time spent with patient: 30 minutes  Past Psychiatric History: See H&P  Past Medical History:  Past Medical History:  Diagnosis Date   Anxiety    Depression    GERD (gastroesophageal reflux disease)    Obesity    Stroke ([place])    Substance abuse ([place])     Past Surgical History:  Procedure Laterality Date   ESOPHAGOGASTRODUODENOSCOPY (EGD) WITH PROPOFOL N/A 09/20/2017   Procedure:  ESOPHAGOGASTRODUODENOSCOPY (EGD) WITH PROPOFOL;  Surgeon: Lin Landsman, MD;  Location: [facility] ENDOSCOPY;  Service: Gastroenterology;  Laterality: N/A;   KNEE SURGERY Right    Family History:  Family History  Problem Relation Age of Onset   Vasculitis Mother    Family Psychiatric  History: See H&P Social History:  Social History   Substance and Sexual Activity  Alcohol Use Not Currently     Social History   Substance and Sexual Activity  Drug Use Yes   Types: Cocaine    Social History   Socioeconomic History   Marital status: Single    Spouse name: Not on file   Number of children: Not on file   Years of education: Not on file   Highest education level: Not on file  Occupational History   Not on file  Tobacco Use   Smoking status: Every Day    Packs/day: 0.50    Types: Cigarettes   Smokeless tobacco: Never  Vaping Use   Vaping Use: Never used  Substance and Sexual Activity   Alcohol use: Not Currently   Drug use: Yes    Types: Cocaine   Sexual activity: Not Currently  Other Topics Concern   Not on file  Social History Narrative   Not on file   Social Determinants of Health   Financial Resource Strain: Not on file  Food Insecurity: Not on file  Transportation Needs: Not on file  Physical Activity: Not on file  Stress: Not on file  Social Connections: Not on file   Additional Social History:  Sleep: Fair  Appetite:  Fair  Current Medications: Current Facility-Administered Medications  Medication Dose Route Frequency Provider Last Rate Last Admin   acetaminophen (TYLENOL) tablet 650 mg  650 mg Oral Q6H PRN Clapacs, [name], MD       alum & mag hydroxide-simeth (MAALOX/MYLANTA) 200-200-20 MG/5ML suspension 30 mL  30 mL Oral Q4H PRN Clapacs, [name], MD       amoxicillin-clavulanate (AUGMENTIN) 875-125 MG per tablet 1 tablet  1 tablet Oral Q12H [name], MD       aspirin EC tablet 81 mg  81 mg Oral Daily  Clapacs, [name], MD   81 mg at 11/08/20 0824   atorvastatin (LIPITOR) tablet 80 mg  80 mg Oral QPM Clapacs, [name], MD   80 mg at 11/07/20 1717   citalopram (CELEXA) tablet 10 mg  10 mg Oral Daily [name], MD   10 mg at 11/08/20 8921   cyclobenzaprine (FLEXERIL) tablet 5 mg  5 mg Oral QHS [name], MD   5 mg at 11/07/20 2111   diclofenac Sodium (VOLTAREN) 1 % topical gel 4 g  4 g Topical QID Clapacs, [name], MD   4 g at 11/06/20 0801   gabapentin (NEURONTIN) capsule 400 mg  400 mg Oral TID Clapacs, [name], MD   400 mg at 11/08/20 1214   hydrALAZINE (APRESOLINE) tablet 25 mg  25 mg Oral QID PRN Ivor Costa, MD       lisinopril (ZESTRIL) tablet 20 mg  20 mg Oral Daily Ivor Costa, MD   20 mg at 11/08/20 1941   magnesium hydroxide (MILK OF MAGNESIA) suspension 30 mL  30 mL Oral Daily PRN Clapacs, [name], MD       meloxicam (MOBIC) tablet 7.5 mg  7.5 mg Oral Daily [name] M, MD   7.5 mg at 11/08/20 0824   pantoprazole (PROTONIX) EC tablet 40 mg  40 mg Oral BID AC [name], MD   40 mg at 11/08/20 7408   prochlorperazine (COMPAZINE) tablet 5 mg  5 mg Oral Q6H PRN [name], MD       QUEtiapine (SEROQUEL) tablet 300 mg  300 mg Oral QHS He, [name], MD   300 mg at 11/07/20 2111    Lab Results:  Results for orders placed or performed during the hospital encounter of 11/02/20 (from the past 48 hour(s))  CBC     Status: None   Collection Time: 11/07/20 10:55 AM  Result Value Ref Range   WBC 8.3 4.0 - 10.5 K/uL   RBC 5.44 4.22 - 5.81 MIL/uL   Hemoglobin 14.3 13.0 - 17.0 g/dL   HCT 44.9 39.0 - 52.0 %   MCV 82.5 80.0 - 100.0 fL   MCH 26.3 26.0 - 34.0 pg   MCHC 31.8 30.0 - 36.0 g/dL   RDW 15.0 11.5 - 15.5 %   Platelets 297 150 - 400 K/uL   nRBC 0.0 0.0 - 0.2 %    Comment: Performed at [facility], [address] [place], [place] [address]  Basic metabolic panel     Status: Abnormal   Collection Time: 11/07/20 10:55 AM  Result Value Ref Range   Sodium  137 135 - 145 mmol/L   Potassium 4.0 3.5 - 5.1 mmol/L   Chloride 107 98 - 111 mmol/L   CO2 26 22 - 32 mmol/L   Glucose, Bld 108 (H) 70 - 99 mg/dL    Comment:  Glucose reference range applies only to samples taken after fasting for at least 8 hours.   BUN 16 6 - 20 mg/dL   Creatinine, Ser 0.75 0.61 - 1.24 mg/dL   Calcium 9.2 8.9 - 10.3 mg/dL   GFR, Estimated >60 >60 mL/min    Comment: (NOTE) Calculated using the CKD-EPI Creatinine Equation (2021)    Anion gap 4 (L) 5 - 15    Comment: Performed at [facility], [address] [place], [place] [address]    Blood Alcohol level:  Lab Results  Component Value Date   [facility] <10 11/01/2020   ETH <10 49/67/5916    Metabolic Disorder Labs: Lab Results  Component Value Date   HGBA1C 5.8 (H) 11/04/2020   MPG 119.76 11/04/2020   MPG 114.02 06/06/2020   No results found for: PROLACTIN Lab Results  Component Value Date   CHOL 133 11/04/2020   TRIG 140 11/04/2020   HDL 34 (L) 11/04/2020   CHOLHDL 3.9 11/04/2020   VLDL 28 11/04/2020   LDLCALC 71 11/04/2020   LDLCALC 48 06/06/2020    Physical Findings: AIMS:  , ,  ,  ,    CIWA:    COWS:     Musculoskeletal: Strength & Muscle Tone: within normal limits Gait & Station: normal Patient leans: N/A  Psychiatric Specialty Exam:  Presentation  General Appearance: Appropriate for Environment; Casual  Eye Contact:Good  Speech:Clear and Coherent; Normal Rate  Speech Volume:Normal  Handedness:Right   Mood and Affect  Mood:Irritable  Affect:Congruent   Thought Process  Thought Processes:Goal Directed; Coherent  Descriptions of Associations:Intact  Orientation:Full (Time, Place and Person)  Thought Content:Logical; Perseveration  History of Schizophrenia/Schizoaffective disorder:No  Duration of Psychotic Symptoms:Less than six months  Hallucinations:Denies  Ideas of Reference:None  Suicidal Thoughts:Denies  Homicidal Thoughts:Denies   Sensorium   Memory:Immediate Good; Recent Good; Remote Good  Judgment:Impaired  Insight:Shallow   Executive Functions  Concentration:Fair  Attention Span:Fair  [place] of Knowledge:Good  Language:Good   Psychomotor Activity  Psychomotor Activity:No data recorded   Assets  Assets:Communication Skills; Desire for Improvement; Resilience; Social Support; Talents/Skills   Sleep  Sleep:Fair, 8    Physical Exam: Physical Exam ROS Blood pressure (!) 150/78, pulse (!) 55, temperature 98 F (36.7 C), temperature source Oral, resp. rate 18, height 6\' 1"  (1.854 m), weight 49.9 kg, SpO2 90 %. Body mass index is 14.51 kg/m.   Treatment Plan Summary: Daily contact with patient to assess and evaluate symptoms and progress in treatment and Medication management 60 year old male presenting with worsening mood in the context of medication noncompliance and relapse on cocaine.  Continue aspirin 81 mg daily for history of CVA.  Continue Lipitor 80 mg every evening for hyperlipidemia.  Continue Celexa 10 mg daily and Seroquel 300 mg at bedtime for mood.  Flexeril 5 mg at bedtime and gabapentin 400 mg 3 times daily for neuropathic pain.  Continue lisinopril 20 mg daily for hypertension.  Protonix 40 mg twice daily with meals and outpatient follow-up for acid reflux. Continue Compazine 5 mg Q6hr PRN for migraine. Continue augment for ear infection.   [name], MD 11/08/2020, 12:31 PM

## 2020-11-08 NOTE — Progress Notes (Signed)
Recreation Therapy Notes   Date: 11/08/2020  Time: 9:30 am   Location: Craft room   Behavioral response: N/A   Intervention Topic: Stress Management    Discussion/Intervention: Patient did not attend group.   Clinical Observations/Feedback:  Patient did not attend group.   Addie Alonge LRT/CTRS        Liliana Brentlinger 11/08/2020 11:23 AM

## 2020-11-08 NOTE — Tx Team (Signed)
Interdisciplinary Treatment and Diagnostic Plan Update  11/08/2020 Time of Session: 8:30AM Samuel Molina MRN: [id number]  Principal Diagnosis: Severe recurrent major depression without psychotic features [facility])  Secondary Diagnoses: Principal Problem:   Severe recurrent major depression without psychotic features (Samuel Molina) Active Problems:   Gastroesophageal reflux disease with esophagitis   Cocaine abuse (Samuel Molina)   Substance induced mood disorder (HCC)   HTN (hypertension)   HLD (hyperlipidemia)   Ear drainage   Current Medications:  Current Facility-Administered Medications  Medication Dose Route Frequency Provider Last Rate Last Admin   acetaminophen (TYLENOL) tablet 650 mg  650 mg Oral Q6H PRN Clapacs, John T, MD       alum & mag hydroxide-simeth (MAALOX/MYLANTA) 200-200-20 MG/5ML suspension 30 mL  30 mL Oral Q4H PRN Clapacs, John T, MD       amoxicillin-clavulanate (AUGMENTIN) 875-125 MG per tablet 1 tablet  1 tablet Oral Q12H [name], MD       aspirin EC tablet 81 mg  81 mg Oral Daily Clapacs, John T, MD   81 mg at 11/08/20 0824   atorvastatin (LIPITOR) tablet 80 mg  80 mg Oral QPM Clapacs, John T, MD   80 mg at 11/07/20 1717   citalopram (CELEXA) tablet 10 mg  10 mg Oral Daily [name], MD   10 mg at 11/08/20 0824   cyclobenzaprine (FLEXERIL) tablet 5 mg  5 mg Oral QHS [name], MD   5 mg at 11/07/20 2111   diclofenac Sodium (VOLTAREN) 1 % topical gel 4 g  4 g Topical QID Clapacs, [name], MD   4 g at 11/06/20 0801   gabapentin (NEURONTIN) capsule 400 mg  400 mg Oral TID Clapacs, [name], MD   400 mg at 11/08/20 9211   hydrALAZINE (APRESOLINE) tablet 25 mg  25 mg Oral QID PRN Ivor Costa, MD       lisinopril (ZESTRIL) tablet 20 mg  20 mg Oral Daily Ivor Costa, MD   20 mg at 11/08/20 9417   magnesium hydroxide (MILK OF MAGNESIA) suspension 30 mL  30 mL Oral Daily PRN Clapacs, [name], MD       meloxicam (MOBIC) tablet 7.5 mg  7.5 mg Oral Daily [name]  M, MD   7.5 mg at 11/08/20 0824   pantoprazole (PROTONIX) EC tablet 40 mg  40 mg Oral BID AC [name], MD   40 mg at 11/08/20 4081   prochlorperazine (COMPAZINE) tablet 5 mg  5 mg Oral Q6H PRN [name], MD       QUEtiapine (SEROQUEL) tablet 300 mg  300 mg Oral QHS He, [name], MD   300 mg at 11/07/20 2111   PTA Medications: Medications Prior to Admission  Medication Sig Dispense Refill Last Dose   aspirin EC 81 MG tablet Take 81 mg by mouth daily. Swallow whole.      atorvastatin (LIPITOR) 80 MG tablet Take 80 mg by mouth every evening.      citalopram (CELEXA) 20 MG tablet Take 1 tablet (20 mg total) by mouth daily. 30 tablet 0    diclofenac Sodium (VOLTAREN) 1 % GEL Apply 4 g topically 4 (four) times daily.      famotidine (PEPCID) 20 MG tablet Take 20 mg by mouth 2 (two) times daily.      gabapentin (NEURONTIN) 400 MG capsule Take 400 mg by mouth 3 (three) times daily.      lidocaine (LIDODERM) 5 % Place 1 patch onto  the skin daily. Remove & Discard patch within 12 hours or as directed by MD      lisinopril (ZESTRIL) 5 MG tablet Take 1 tablet (5 mg total) by mouth daily. 30 tablet 0    pantoprazole (PROTONIX) 40 MG tablet Take 1 tablet (40 mg total) by mouth daily. 30 tablet 1    QUEtiapine (SEROQUEL) 100 MG tablet Take 100 mg by mouth at bedtime.      QUEtiapine (SEROQUEL) 300 MG tablet TAKE 1 TABLET BY MOUTH AT BEDTIME 7 tablet 0    Riboflavin 100 MG CAPS Take 100 mg by mouth daily.      tiZANidine (ZANAFLEX) 2 MG tablet Take 2 mg by mouth every 8 (eight) hours as needed for muscle spasms.       Patient Stressors: Financial difficulties Health problems Substance abuse  Patient Strengths: Ability for insight Motivation for treatment/growth Supportive family/friends  Treatment Modalities: Medication Management, Group therapy, Case management,  1 to 1 session with clinician, Psychoeducation, Recreational therapy.   Physician Treatment Plan for Primary Diagnosis:  Severe recurrent major depression without psychotic features ([place]) Long Term Goal(s): Improvement in symptoms so as ready for discharge   Short Term Goals: Ability to identify changes in lifestyle to reduce recurrence of condition will improve Ability to verbalize feelings will improve Ability to disclose and discuss suicidal ideas Ability to demonstrate self-control will improve Ability to identify and develop effective coping behaviors will improve Ability to maintain clinical measurements within normal limits will improve Compliance with prescribed medications will improve Ability to identify triggers associated with substance abuse/mental health issues will improve  Medication Management: Evaluate patient's response, side effects, and tolerance of medication regimen.  Therapeutic Interventions: 1 to 1 sessions, Unit Group sessions and Medication administration.  Evaluation of Outcomes: Progressing  Physician Treatment Plan for Secondary Diagnosis: Principal Problem:   Severe recurrent major depression without psychotic features ([place]) Active Problems:   Gastroesophageal reflux disease with esophagitis   Cocaine abuse ([place])   Substance induced mood disorder (HCC)   HTN (hypertension)   HLD (hyperlipidemia)   Ear drainage  Long Term Goal(s): Improvement in symptoms so as ready for discharge   Short Term Goals: Ability to identify changes in lifestyle to reduce recurrence of condition will improve Ability to verbalize feelings will improve Ability to disclose and discuss suicidal ideas Ability to demonstrate self-control will improve Ability to identify and develop effective coping behaviors will improve Ability to maintain clinical measurements within normal limits will improve Compliance with prescribed medications will improve Ability to identify triggers associated with substance abuse/mental health issues will improve     Medication Management: Evaluate patient's response,  side effects, and tolerance of medication regimen.  Therapeutic Interventions: 1 to 1 sessions, Unit Group sessions and Medication administration.  Evaluation of Outcomes: Progressing   RN Treatment Plan for Primary Diagnosis: Severe recurrent major depression without psychotic features ([place]) Long Term Goal(s): Knowledge of disease and therapeutic regimen to maintain health will improve  Short Term Goals: Ability to demonstrate self-control, Ability to participate in decision making will improve, Ability to verbalize feelings will improve, Ability to disclose and discuss suicidal ideas, Ability to identify and develop effective coping behaviors will improve, and Compliance with prescribed medications will improve  Medication Management: RN will administer medications as ordered by provider, will assess and evaluate patient's response and provide education to patient for prescribed medication. RN will report any adverse and/or side effects to prescribing provider.  Therapeutic Interventions: 1 on 1 counseling  sessions, Psychoeducation, Medication administration, Evaluate responses to treatment, Monitor vital signs and CBGs as ordered, Perform/monitor CIWA, COWS, AIMS and Fall Risk screenings as ordered, Perform wound care treatments as ordered.  Evaluation of Outcomes: Progressing   LCSW Treatment Plan for Primary Diagnosis: Severe recurrent major depression without psychotic features ([place]) Long Term Goal(s): Safe transition to appropriate next level of care at discharge, Engage patient in therapeutic group addressing interpersonal concerns.  Short Term Goals: Engage patient in aftercare planning with referrals and resources, Increase social support, Increase ability to appropriately verbalize feelings, Increase emotional regulation, Facilitate acceptance of mental health diagnosis and concerns, Identify triggers associated with mental health/substance abuse issues, and Increase skills for  wellness and recovery  Therapeutic Interventions: Assess for all discharge needs, 1 to 1 time with Social worker, Explore available resources and support systems, Assess for adequacy in community support network, Educate family and significant other(s) on suicide prevention, Complete Psychosocial Assessment, Interpersonal group therapy.  Evaluation of Outcomes: Progressing   Progress in Treatment: Attending groups: No. Participating in groups: No. Taking medication as prescribed: Yes. Toleration medication: Yes. Family/Significant other contact made: No, will contact:  once permission is given. Patient understands diagnosis: Yes. Discussing patient identified problems/goals with staff: Yes. Medical problems stabilized or resolved: Yes. Denies suicidal/homicidal ideation: Yes. Issues/concerns per patient self-inventory: No. Other: none  New problem(s) identified: No, Describe:  none  New Short Term/Long Term Goal(s): elimination of symptoms of psychosis, medication management for mood stabilization; elimination of SI thoughts; development of comprehensive mental wellness/sobriety plan. Update 11/08/2020:  No changes at this time.  Patient Goals:  "getting my head together"  Update 11/08/2020:  No changes at this time.  Discharge Plan or Barriers: CSW will assist patient in developing an appropriate discharge plan to meet his needs. Update 11/08/2020:  Patient has identified that he would like to go to [facility].  He indicates that a bed will not be available until later this week.  CSW will continue to assist patient in developing a appropriate discharge plan.   Reason for Continuation of Hospitalization: Anxiety Depression Medication stabilization Suicidal ideation  Estimated Length of Stay:  1-7 days  Recreational Therapy: Patient Stressors: N/A Patient Goal: Patient will engage in groups without prompting or encouragement from LRT x3 group sessions within 5  recreation therapy group sessions.  Attendees: Patient:  11/08/2020 10:07 AM  Physician: Dr. Domingo Cocking, MD  11/08/2020 10:07 AM  Nursing:  11/08/2020 10:07 AM  RN Care Manager: 11/08/2020 10:07 AM  Social Worker: Assunta Curtis, MSW, LCSW  11/08/2020 10:07 AM  Recreational Therapist:  11/08/2020 10:07 AM  Other:  11/08/2020 10:07 AM  Other:  11/08/2020 10:07 AM  Other: 11/08/2020 10:07 AM    Scribe for Treatment Team: Rozann Lesches, LCSW 11/08/2020 10:07 AM

## 2020-11-08 NOTE — Progress Notes (Addendum)
PROGRESS NOTE    Samuel Molina  CVE:[id number] DOB: 1960/11/22 DOA: 11/02/2020 PCP: Pcp, No    Assessment & Plan:   Principal Problem:   Severe recurrent major depression without psychotic features ([place]) Active Problems:   Gastroesophageal reflux disease with esophagitis   Cocaine abuse ([place])   Substance induced mood disorder (HCC)   HTN (hypertension)   HLD (hyperlipidemia)   Ear drainage   Severe recurrent major depression:  no suicidal or homicidal ideations. Management per primary team   Left ear drainage: etiology unclear. Continue on augmentin empirically x 5 days.  Chronic burping: etiology unclear. EGD done approx 1.5 years and was normal. Recommend f/u w/ GI outpatient to see if pt is candidate for repeat EGD, consider Dr. Vicente Males.   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 Recommend pt f/u opthalmology outpatient as well, consider Dr. Edison Pace    GERD: continue on PPI. Mylanta prn    Cocaine abuse: illicit drug use cessation counseling    HTN: continue on increase dose of lisinopril. Hydralazine prn   HLD: continue on statin    DVT prophylaxis: SCDs, encourage ambulation  Code Status: full  Family Communication:  Disposition Plan: as per primary team   Level of care: pysch unit   Consultants:  Hospitalist   Procedures:   Antimicrobials: augmentin    Subjective: Pt c/o burping and eye tearing   Objective: Vitals:   11/06/20 0800 11/07/20 0633 11/07/20 1717 11/08/20 0613  BP: (!) 172/90 (!) 158/92 137/74 (!) 150/78  Pulse:  (!) 51 65 (!) 55  Resp:    18  Temp:  (!) 97.5 F (36.4 C)  98 F (36.7 C)  TempSrc:  Oral  Oral  SpO2:  100%  90%  Weight:      Height:       No intake or output data in the 24 hours ending 11/08/20 0837 Filed Weights   11/02/20 2216  Weight: 49.9 kg    Examination:  General exam: Appears calm and comfortable  Respiratory system: Clear to auscultation. Respiratory effort normal. Cardiovascular  system: S1 & S2+. No  rubs, gallops or clicks.  Gastrointestinal system: Abdomen is nondistended, soft and nontender. Normal bowel sounds heard. Central nervous system: Alert and oriented. Moves all extremities  Psychiatry: Judgement and insight appear normal. Flat mood and affect    Data Reviewed: I have personally reviewed following labs and imaging studies  CBC: Recent Labs  Lab 11/01/20 1420 11/07/20 1055  WBC 7.5 8.3  HGB 15.3 14.3  HCT 46.6 44.9  MCV 80.8 82.5  PLT 320 025   Basic Metabolic Panel: Recent Labs  Lab 11/01/20 1420 11/07/20 1055  NA 136 137  K 4.0 4.0  CL 103 107  CO2 24 26  GLUCOSE 156* 108*  BUN 21* 16  CREATININE 1.08 0.75  CALCIUM 9.3 9.2   GFR: Estimated Creatinine Clearance: 71 mL/min (by C-G formula based on SCr of 0.75 mg/dL). Liver Function Tests: Recent Labs  Lab 11/01/20 1420  AST 28  ALT 17  ALKPHOS 98  BILITOT 1.2  PROT 8.4*  ALBUMIN 4.6   No results for input(s): LIPASE, AMYLASE in the last 168 hours. No results for input(s): AMMONIA in the last 168 hours. Coagulation Profile: No results for input(s): INR, PROTIME in the last 168 hours. Cardiac Enzymes: No results for input(s): CKTOTAL, CKMB, CKMBINDEX, TROPONINI in the last 168 hours. BNP (last 3 results) No results for input(s): PROBNP in the last 8760 hours. HbA1C:  No results for input(s): HGBA1C in the last 72 hours. CBG: No results for input(s): GLUCAP in the last 168 hours. Lipid Profile: No results for input(s): CHOL, HDL, LDLCALC, TRIG, CHOLHDL, LDLDIRECT in the last 72 hours. Thyroid Function Tests: No results for input(s): TSH, T4TOTAL, FREET4, T3FREE, THYROIDAB in the last 72 hours. Anemia Panel: No results for input(s): VITAMINB12, FOLATE, FERRITIN, TIBC, IRON, RETICCTPCT in the last 72 hours. Sepsis Labs: No results for input(s): PROCALCITON, LATICACIDVEN in the last 168 hours.  Recent Results (from the past 240 hour(s))  SARS CORONAVIRUS 2 (TAT 6-24  HRS) Nasopharyngeal Nasopharyngeal Swab     Status: None   Collection Time: 11/01/20  5:17 PM   Specimen: Nasopharyngeal Swab  Result Value Ref Range Status   SARS Coronavirus 2 NEGATIVE NEGATIVE Final    Comment: (NOTE) SARS-CoV-2 target nucleic acids are NOT DETECTED.  The SARS-CoV-2 RNA is generally detectable in upper and lower respiratory specimens during the acute phase of infection. Negative results do not preclude SARS-CoV-2 infection, do not rule out co-infections with other pathogens, and should not be used as the sole basis for treatment or other patient management decisions. Negative results must be combined with clinical observations, patient history, and epidemiological information. The expected result is Negative.  Fact Sheet for Patients: SugarRoll.be  Fact Sheet for Healthcare Providers: https://www.woods-mathews.com/  This test is not yet approved or cleared by the [place] FDA and  has been authorized for detection and/or diagnosis of SARS-CoV-2 by FDA under an Emergency Use Authorization (EUA). This EUA will remain  in effect (meaning this test can be used) for the duration of the COVID-19 declaration under Se ction 564(b)(1) of the Act, 21 U.S.C. section 360bbb-3(b)(1), unless the authorization is terminated or revoked sooner.  Performed at [facility], [place] [address] [place], [place] [address]          Radiology Studies: No results found.      Scheduled Meds:  amoxicillin-clavulanate  1 tablet Oral Q12H   aspirin EC  81 mg Oral Daily   atorvastatin  80 mg Oral QPM   citalopram  10 mg Oral Daily   cyclobenzaprine  5 mg Oral QHS   diclofenac Sodium  4 g Topical QID   gabapentin  400 mg Oral TID   lisinopril  20 mg Oral Daily   meloxicam  7.5 mg Oral Daily   pantoprazole  40 mg Oral BID AC   QUEtiapine  300 mg Oral QHS   Continuous Infusions:   LOS: 6 days    Time spent: 33 mins      [name], MD [facility] Pager [phone]  If 7PM-7AM, please contact night-coverage 11/08/2020, 8:37 AM

## 2020-11-08 NOTE — BHH Counselor (Signed)
CSW called [facility] in [place], [place]. [place] spoke with [place], [phone]. He stated that he had discussed with pt bed availability. He stated that currently there is not bed, but that there should be openings by the end of the week. CSW will follow up with pt about appropriate discharge plan and options for treatment.   Brittnye Josephs [place], MSW, LCSW-A 7/18/20223:36 PM

## 2020-11-09 DIAGNOSIS — H9212 Otorrhea, left ear: Secondary | ICD-10-CM

## 2020-11-09 DIAGNOSIS — F332 Major depressive disorder, recurrent severe without psychotic features: Secondary | ICD-10-CM | POA: Diagnosis not present

## 2020-11-09 NOTE — Progress Notes (Signed)
Patient is calm and cooperative with assessment. He denies suicidal ideation, homicidal ideation, and auditory and visual hallucinations. Patient reports feeling better than he did a few days ago. He is in a pleasant mood and begins talking about [place] to this Probation officer. He appears content with going there after discharge. Patient is compliant with scheduled medications. He is observed to be appropriate with staff and other patients. Patient remains safe on the unit at this time.

## 2020-11-09 NOTE — Progress Notes (Signed)
[facility] MD Progress Note  11/09/2020 11:09 AM [place]  MRN:  [id number]  CC "Still on for Thursday."  Subjective: 60 year old male with history of mood problems and substance abuse presenting for suicidal ideations and homicidal ideations.  No acute overnight events, medication compliant, attending to ADLs.  Patient seen one-on-one again today.  He notes that he is feeling better today.  He notes that his sleep has improved, he is less irritable, and less depressed.  He continues to have auditory hallucinations, but notes they have decreased in severity.  He continues to have cocaine cravings.  He has been accepted to sober living [place] towards the end of the week.  He continues to call to see if there is a bed opens sooner.  He denies any suicidal ideations, homicidal ideations, visual hallucinations.  He continues to have some ear drainage and is being followed by the hospitalist team.  Principal Problem: Severe recurrent major depression without psychotic features ([place]) Diagnosis: Principal Problem:   Severe recurrent major depression without psychotic features ([place]) Active Problems:   Gastroesophageal reflux disease with esophagitis   Cocaine abuse ([place])   Substance induced mood disorder (HCC)   HTN (hypertension)   HLD (hyperlipidemia)   Ear drainage  Total Time spent with patient: 30 minutes  Past Psychiatric History: See H&P  Past Medical History:  Past Medical History:  Diagnosis Date   Anxiety    Depression    GERD (gastroesophageal reflux disease)    Obesity    Stroke ([place])    Substance abuse ([place])     Past Surgical History:  Procedure Laterality Date   ESOPHAGOGASTRODUODENOSCOPY (EGD) WITH PROPOFOL N/A 09/20/2017   Procedure: ESOPHAGOGASTRODUODENOSCOPY (EGD) WITH PROPOFOL;  Surgeon: Lin Landsman, MD;  Location: [facility] ENDOSCOPY;  Service: Gastroenterology;  Laterality: N/A;   KNEE SURGERY Right    Family History:  Family History  Problem Relation Age of  Onset   Vasculitis Mother    Family Psychiatric  History: See H&P Social History:  Social History   Substance and Sexual Activity  Alcohol Use Not Currently     Social History   Substance and Sexual Activity  Drug Use Yes   Types: Cocaine    Social History   Socioeconomic History   Marital status: Single    Spouse name: Not on file   Number of children: Not on file   Years of education: Not on file   Highest education level: Not on file  Occupational History   Not on file  Tobacco Use   Smoking status: Every Day    Packs/day: 0.50    Types: Cigarettes   Smokeless tobacco: Never  Vaping Use   Vaping Use: Never used  Substance and Sexual Activity   Alcohol use: Not Currently   Drug use: Yes    Types: Cocaine   Sexual activity: Not Currently  Other Topics Concern   Not on file  Social History Narrative   Not on file   Social Determinants of Health   Financial Resource Strain: Not on file  Food Insecurity: Not on file  Transportation Needs: Not on file  Physical Activity: Not on file  Stress: Not on file  Social Connections: Not on file   Additional Social History:                         Sleep: Fair  Appetite:  Fair  Current Medications: Current Facility-Administered Medications  Medication Dose Route Frequency Provider Last  Rate Last Admin   acetaminophen (TYLENOL) tablet 650 mg  650 mg Oral Q6H PRN Clapacs, John T, MD       alum & mag hydroxide-simeth (MAALOX/MYLANTA) 200-200-20 MG/5ML suspension 30 mL  30 mL Oral Q4H PRN Clapacs, [name], MD       amoxicillin-clavulanate (AUGMENTIN) 875-125 MG per tablet 1 tablet  1 tablet Oral Q12H [name], MD   1 tablet at 11/09/20 6644   aspirin EC tablet 81 mg  81 mg Oral Daily Clapacs, [name], MD   81 mg at 11/09/20 0817   atorvastatin (LIPITOR) tablet 80 mg  80 mg Oral QPM Clapacs, John T, MD   80 mg at 11/08/20 1721   citalopram (CELEXA) tablet 10 mg  10 mg Oral Daily [name], MD    10 mg at 11/09/20 0817   cyclobenzaprine (FLEXERIL) tablet 5 mg  5 mg Oral QHS [name], MD   5 mg at 11/08/20 2106   diclofenac Sodium (VOLTAREN) 1 % topical gel 4 g  4 g Topical QID Clapacs, [name], MD   4 g at 11/06/20 0801   gabapentin (NEURONTIN) capsule 400 mg  400 mg Oral TID Clapacs, [name], MD   400 mg at 11/09/20 0347   hydrALAZINE (APRESOLINE) tablet 25 mg  25 mg Oral QID PRN Ivor Costa, MD       lisinopril (ZESTRIL) tablet 20 mg  20 mg Oral Daily Ivor Costa, MD   20 mg at 11/09/20 4259   magnesium hydroxide (MILK OF MAGNESIA) suspension 30 mL  30 mL Oral Daily PRN Clapacs, [name], MD       meloxicam (MOBIC) tablet 7.5 mg  7.5 mg Oral Daily [name] M, MD   7.5 mg at 11/09/20 0817   pantoprazole (PROTONIX) EC tablet 40 mg  40 mg Oral BID AC [name], MD   40 mg at 11/09/20 0817   prochlorperazine (COMPAZINE) tablet 5 mg  5 mg Oral Q6H PRN [name], MD       QUEtiapine (SEROQUEL) tablet 300 mg  300 mg Oral QHS He, [name], MD   300 mg at 11/08/20 2106    Lab Results:  No results found for this or any previous visit (from the past 89 hour(s)).   Blood Alcohol level:  Lab Results  Component Value Date   ETH <10 11/01/2020   ETH <10 56/38/7564    Metabolic Disorder Labs: Lab Results  Component Value Date   HGBA1C 5.8 (H) 11/04/2020   MPG 119.76 11/04/2020   MPG 114.02 06/06/2020   No results found for: PROLACTIN Lab Results  Component Value Date   CHOL 133 11/04/2020   TRIG 140 11/04/2020   HDL 34 (L) 11/04/2020   CHOLHDL 3.9 11/04/2020   VLDL 28 11/04/2020   LDLCALC 71 11/04/2020   LDLCALC 48 06/06/2020    Physical Findings: AIMS:  , ,  ,  ,    CIWA:    COWS:     Musculoskeletal: Strength & Muscle Tone: within normal limits Gait & Station: normal Patient leans: N/A  Psychiatric Specialty Exam:  Presentation  General Appearance: Appropriate for Environment; Casual  Eye Contact:Good  Speech:Clear and Coherent; Normal  Rate  Speech Volume:Normal  Handedness:Right   Mood and Affect  Mood:Irritable  Affect:Congruent   Thought Process  Thought Processes:Goal Directed; Coherent  Descriptions of Associations:Intact  Orientation:Full (Time, Place and Person)  Thought Content:Logical; Perseveration  History of Schizophrenia/Schizoaffective disorder:No  Duration of Psychotic Symptoms:Less than six months  Hallucinations:Denies  Ideas of Reference:None  Suicidal Thoughts:Denies  Homicidal Thoughts:Denies   Sensorium  Memory:Immediate Good; Recent Good; Remote Good  Judgment:Impaired  Insight:Shallow   Executive Functions  Concentration:Fair  Attention Span:Fair  [place] of Knowledge:Good  Language:Good   Psychomotor Activity  Psychomotor Activity:No data recorded   Assets  Assets:Communication Skills; Desire for Improvement; Resilience; Social Support; Talents/Skills   Sleep  Sleep:Fair, 8.25    Physical Exam: Physical Exam ROS Blood pressure 137/84, pulse 88, temperature 98.5 F (36.9 C), temperature source Oral, resp. rate 17, height 6\' 1"  (1.854 m), weight 49.9 kg, SpO2 100 %. Body mass index is 14.51 kg/m.   Treatment Plan Summary: Daily contact with patient to assess and evaluate symptoms and progress in treatment and Medication management 60 year old male presenting with worsening mood in the context of medication noncompliance and relapse on cocaine.  Continue aspirin 81 mg daily for history of CVA.  Continue Lipitor 80 mg every evening for hyperlipidemia.  Continue Celexa 10 mg daily and Seroquel 300 mg at bedtime for mood.  Flexeril 5 mg at bedtime and gabapentin 400 mg 3 times daily for neuropathic pain.  Continue lisinopril 20 mg daily for hypertension.  Protonix 40 mg twice daily with meals and outpatient follow-up for acid reflux. Continue Compazine 5 mg Q6hr PRN for migraine. Continue Augmentin for ear infection.   11/09/20: Psychiatric  exam above reviewed and remains accurate. Assessment and plan above reviewed and updated.    [name], MD 11/09/2020, 11:09 AM

## 2020-11-09 NOTE — Plan of Care (Signed)
  Problem: Group Participation Goal: STG - Patient will engage in interactions with peers and staff in pro-social manner at least 2x within 5 recreation therapy group sessions Description: STG - Patient will engage in interactions with peers and staff in pro-social manner at least 2x within 5 recreation therapy group sessions Outcome: Not Progressing

## 2020-11-09 NOTE — Progress Notes (Signed)
PROGRESS NOTE    Samuel Molina  PXT:[id number] DOB: [birth year] DOA: 11/02/2020 PCP: Pcp, No    Assessment & Plan:   Principal Problem:   Severe recurrent major depression without psychotic features ([place]) Active Problems:   Gastroesophageal reflux disease with esophagitis   Cocaine abuse ([place])   Substance induced mood disorder (HCC)   HTN (hypertension)   HLD (hyperlipidemia)   Ear drainage   Severe recurrent major depression:  no suicidal or homicidal ideations. Management per primary team   Left ear drainage: etiology unclear. Continue on augmentin empirically x 5 days total.  Chronic burping: etiology unclear. EGD done approx 1.5 years and was normal. Recommend f/u w/ GI outpatient to see if pt is candidate for repeat EGD, consider Dr. Vicente Males.   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 Recommend pt f/u opthalmology outpatient as well, consider Dr. Edison Pace    GERD: continue on pantoprazole. Mylanta prn    Cocaine abuse: received illicit use cessation counseling    HTN: continue on lisinopril. Hydralazine prn    HLD: continue on statin    DVT prophylaxis: SCDs, encourage ambulation  Code Status: full  Family Communication:  Disposition Plan: as per primary team   Pt is stable from the medical standpoint. Hospitalist service will sign off today.   Level of care: pysch unit   Consultants:  Hospitalist   Procedures:   Antimicrobials: augmentin    Subjective: Pt c/o eye tearing still   Objective: Vitals:   11/07/20 1717 11/08/20 0613 11/08/20 2106 11/09/20 0652  BP: 137/74 (!) 150/78 103/83 137/84  Pulse: 65 (!) 55 72 88  Resp:  18  17  Temp:  98 F (36.7 C)  98.5 F (36.9 C)  TempSrc:  Oral  Oral  SpO2:  90%  100%  Weight:      Height:       No intake or output data in the 24 hours ending 11/09/20 0807 Filed Weights   11/02/20 2216  Weight: 49.9 kg    Examination:  General exam: Appears comfortable  Respiratory system: clear  breath sounds b/l  Cardiovascular system: S1/S2+. No rubs or gallops  Gastrointestinal system: Abd is soft, NT, obese & normal bowel sounds Central nervous system: Alert and oriented. Moves all extremities   Psychiatry: Judgement and insight appear normal. Flat mood and affect    Data Reviewed: I have personally reviewed following labs and imaging studies  CBC: Recent Labs  Lab 11/07/20 1055  WBC 8.3  HGB 14.3  HCT 44.9  MCV 82.5  PLT 627   Basic Metabolic Panel: Recent Labs  Lab 11/07/20 1055  NA 137  K 4.0  CL 107  CO2 26  GLUCOSE 108*  BUN 16  CREATININE 0.75  CALCIUM 9.2   GFR: Estimated Creatinine Clearance: 71 mL/min (by C-G formula based on SCr of 0.75 mg/dL). Liver Function Tests: No results for input(s): AST, ALT, ALKPHOS, BILITOT, PROT, ALBUMIN in the last 168 hours.  No results for input(s): LIPASE, AMYLASE in the last 168 hours. No results for input(s): AMMONIA in the last 168 hours. Coagulation Profile: No results for input(s): INR, PROTIME in the last 168 hours. Cardiac Enzymes: No results for input(s): CKTOTAL, CKMB, CKMBINDEX, TROPONINI in the last 168 hours. BNP (last 3 results) No results for input(s): PROBNP in the last 8760 hours. HbA1C: No results for input(s): HGBA1C in the last 72 hours. CBG: No results for input(s): GLUCAP in the last 168 hours. Lipid Profile: No results  for input(s): CHOL, HDL, LDLCALC, TRIG, CHOLHDL, LDLDIRECT in the last 72 hours. Thyroid Function Tests: No results for input(s): TSH, T4TOTAL, FREET4, T3FREE, THYROIDAB in the last 72 hours. Anemia Panel: No results for input(s): VITAMINB12, FOLATE, FERRITIN, TIBC, IRON, RETICCTPCT in the last 72 hours. Sepsis Labs: No results for input(s): PROCALCITON, LATICACIDVEN in the last 168 hours.  Recent Results (from the past 240 hour(s))  SARS CORONAVIRUS 2 (TAT 6-24 HRS) Nasopharyngeal Nasopharyngeal Swab     Status: None   Collection Time: 11/01/20  5:17 PM    Specimen: Nasopharyngeal Swab  Result Value Ref Range Status   SARS Coronavirus 2 NEGATIVE NEGATIVE Final    Comment: (NOTE) SARS-CoV-2 target nucleic acids are NOT DETECTED.  The SARS-CoV-2 RNA is generally detectable in upper and lower respiratory specimens during the acute phase of infection. Negative results do not preclude SARS-CoV-2 infection, do not rule out co-infections with other pathogens, and should not be used as the sole basis for treatment or other patient management decisions. Negative results must be combined with clinical observations, patient history, and epidemiological information. The expected result is Negative.  Fact Sheet for Patients: SugarRoll.be  Fact Sheet for Healthcare Providers: https://www.woods-mathews.com/  This test is not yet approved or cleared by the [place] FDA and  has been authorized for detection and/or diagnosis of SARS-CoV-2 by FDA under an Emergency Use Authorization (EUA). This EUA will remain  in effect (meaning this test can be used) for the duration of the COVID-19 declaration under Se ction 564(b)(1) of the Act, 21 U.S.C. section 360bbb-3(b)(1), unless the authorization is terminated or revoked sooner.  Performed at [facility], [place] [address], [place], [place] [address]          Radiology Studies: No results found.      Scheduled Meds:  amoxicillin-clavulanate  1 tablet Oral Q12H   aspirin EC  81 mg Oral Daily   atorvastatin  80 mg Oral QPM   citalopram  10 mg Oral Daily   cyclobenzaprine  5 mg Oral QHS   diclofenac Sodium  4 g Topical QID   gabapentin  400 mg Oral TID   lisinopril  20 mg Oral Daily   meloxicam  7.5 mg Oral Daily   pantoprazole  40 mg Oral BID AC   QUEtiapine  300 mg Oral QHS   Continuous Infusions:   LOS: 7 days    Time spent: 34 mins     [name], MD [facility] Pager [phone]  If 7PM-7AM, please contact  night-coverage 11/09/2020, 8:07 AM

## 2020-11-09 NOTE — BHH Group Notes (Signed)
LCSW Group Therapy Note  11/09/2020 2:06 PM  Type of Therapy/Topic:  Group Therapy:  Feelings about Diagnosis  Participation Level:  Did Not Attend   Description of Group:   This group will allow patients to explore their thoughts and feelings about diagnoses they have received. Patients will be guided to explore their level of understanding and acceptance of these diagnoses. Facilitator will encourage patients to process their thoughts and feelings about the reactions of others to their diagnosis and will guide patients in identifying ways to discuss their diagnosis with significant others in their lives. This group will be process-oriented, with patients participating in exploration of their own experiences, giving and receiving support, and processing challenge from other group members.   Therapeutic Goals: Patient will demonstrate understanding of diagnosis as evidenced by identifying two or more symptoms of the disorder Patient will be able to express two feelings regarding the diagnosis Patient will demonstrate their ability to communicate their needs through discussion and/or role play  Summary of Patient Progress: X  Therapeutic Modalities:   Cognitive Behavioral Therapy Brief Therapy Feelings Identification   Jakyren Fluegge R. Guerry Bruin, MSW, [place], [place] 11/09/2020 2:06 PM

## 2020-11-09 NOTE — Progress Notes (Signed)
Recreation Therapy Notes  Date: 11/09/2020  Time: 9:30 am   Location: Craft room   Behavioral response: N/A   Intervention Topic: Coping Skills   Discussion/Intervention: Patient did not attend group.   Clinical Observations/Feedback:  Patient did not attend group.   Ladonte Verstraete LRT/CTRS        Bettye Sitton 11/09/2020 1:15 PM

## 2020-11-10 DIAGNOSIS — F332 Major depressive disorder, recurrent severe without psychotic features: Secondary | ICD-10-CM | POA: Diagnosis not present

## 2020-11-10 MED ORDER — LOPERAMIDE HCL 2 MG PO CAPS: 2.0000 mg | ORAL_CAPSULE | ORAL | Status: DC | PRN

## 2020-11-10 MED ORDER — ONDANSETRON HCL 4 MG PO TABS: 8.0000 mg | ORAL_TABLET | Freq: Three times a day (TID) | ORAL | Status: DC | PRN

## 2020-11-10 NOTE — Progress Notes (Signed)
[facility] MD Progress Note  11/10/2020 10:09 AM Samuel Molina  MRN:  [id number]  CC "Had a rough night."  Subjective: 60 year old male with history of mood problems and substance abuse presenting for suicidal ideations and homicidal ideations.  No acute overnight events, medication compliant, attending to ADLs.  Patient seen one-on-one this morning and reports that he had a "rough night".  He states he had nausea, vomiting, and diarrhea throughout the night.  Symptoms are now improving this morning.  He believes this is secondary to something he ate last night.  He denies any suicidal ideations, homicidal ideations, visual hallucinations, auditory hallucinations.  He continues to be interested in going to sober living [place] for residential substance abuse treatment.  No other complaints today. Principal Problem: Severe recurrent major depression without psychotic features ([place]) Diagnosis: Principal Problem:   Severe recurrent major depression without psychotic features ([place]) Active Problems:   Gastroesophageal reflux disease with esophagitis   Cocaine abuse ([place])   Substance induced mood disorder (HCC)   HTN (hypertension)   HLD (hyperlipidemia)   Ear drainage  Total Time spent with patient: 30 minutes  Past Psychiatric History: See H&P  Past Medical History:  Past Medical History:  Diagnosis Date   Anxiety    Depression    GERD (gastroesophageal reflux disease)    Obesity    Stroke ([place])    Substance abuse ([place])     Past Surgical History:  Procedure Laterality Date   ESOPHAGOGASTRODUODENOSCOPY (EGD) WITH PROPOFOL N/A 09/20/2017   Procedure: ESOPHAGOGASTRODUODENOSCOPY (EGD) WITH PROPOFOL;  Surgeon: Lin Landsman, MD;  Location: [facility] ENDOSCOPY;  Service: Gastroenterology;  Laterality: N/A;   KNEE SURGERY Right    Family History:  Family History  Problem Relation Age of Onset   Vasculitis Mother    Family Psychiatric  History: See H&P Social History:  Social History    Substance and Sexual Activity  Alcohol Use Not Currently     Social History   Substance and Sexual Activity  Drug Use Yes   Types: Cocaine    Social History   Socioeconomic History   Marital status: Single    Spouse name: Not on file   Number of children: Not on file   Years of education: Not on file   Highest education level: Not on file  Occupational History   Not on file  Tobacco Use   Smoking status: Every Day    Packs/day: 0.50    Types: Cigarettes   Smokeless tobacco: Never  Vaping Use   Vaping Use: Never used  Substance and Sexual Activity   Alcohol use: Not Currently   Drug use: Yes    Types: Cocaine   Sexual activity: Not Currently  Other Topics Concern   Not on file  Social History Narrative   Not on file   Social Determinants of Health   Financial Resource Strain: Not on file  Food Insecurity: Not on file  Transportation Needs: Not on file  Physical Activity: Not on file  Stress: Not on file  Social Connections: Not on file   Additional Social History:                         Sleep: Fair  Appetite:  Fair  Current Medications: Current Facility-Administered Medications  Medication Dose Route Frequency Provider Last Rate Last Admin   acetaminophen (TYLENOL) tablet 650 mg  650 mg Oral Q6H PRN Clapacs, [name], MD       alum &  mag hydroxide-simeth (MAALOX/MYLANTA) 200-200-20 MG/5ML suspension 30 mL  30 mL Oral Q4H PRN Clapacs, [name], MD       amoxicillin-clavulanate (AUGMENTIN) 875-125 MG per tablet 1 tablet  1 tablet Oral Q12H [name], MD   1 tablet at 11/10/20 3151   aspirin EC tablet 81 mg  81 mg Oral Daily Clapacs, [name], MD   81 mg at 11/10/20 7616   atorvastatin (LIPITOR) tablet 80 mg  80 mg Oral QPM Clapacs, John T, MD   80 mg at 11/09/20 1718   citalopram (CELEXA) tablet 10 mg  10 mg Oral Daily [name], MD   10 mg at 11/10/20 0737   cyclobenzaprine (FLEXERIL) tablet 5 mg  5 mg Oral QHS [name], MD    5 mg at 11/10/20 1062   diclofenac Sodium (VOLTAREN) 1 % topical gel 4 g  4 g Topical QID Clapacs, [name], MD   4 g at 11/09/20 1215   gabapentin (NEURONTIN) capsule 400 mg  400 mg Oral TID Clapacs, John T, MD   400 mg at 11/10/20 6948   hydrALAZINE (APRESOLINE) tablet 25 mg  25 mg Oral QID PRN Ivor Costa, MD       lisinopril (ZESTRIL) tablet 20 mg  20 mg Oral Daily Ivor Costa, MD   20 mg at 11/10/20 5462   loperamide (IMODIUM) capsule 2 mg  2 mg Oral Q4H PRN [name], MD       magnesium hydroxide (MILK OF MAGNESIA) suspension 30 mL  30 mL Oral Daily PRN Clapacs, John T, MD       meloxicam (MOBIC) tablet 7.5 mg  7.5 mg Oral Daily [name] M, MD   7.5 mg at 11/10/20 0808   ondansetron (ZOFRAN) tablet 8 mg  8 mg Oral Q8H PRN [name], MD       pantoprazole (PROTONIX) EC tablet 40 mg  40 mg Oral BID AC [name], MD   40 mg at 11/10/20 7035   prochlorperazine (COMPAZINE) tablet 5 mg  5 mg Oral Q6H PRN [name], MD       QUEtiapine (SEROQUEL) tablet 300 mg  300 mg Oral QHS He, [name], MD   300 mg at 11/10/20 0093    Lab Results:  No results found for this or any previous visit (from the past 77 hour(s)).   Blood Alcohol level:  Lab Results  Component Value Date   ETH <10 11/01/2020   ETH <10 81/82/9937    Metabolic Disorder Labs: Lab Results  Component Value Date   HGBA1C 5.8 (H) 11/04/2020   MPG 119.76 11/04/2020   MPG 114.02 06/06/2020   No results found for: PROLACTIN Lab Results  Component Value Date   CHOL 133 11/04/2020   TRIG 140 11/04/2020   HDL 34 (L) 11/04/2020   CHOLHDL 3.9 11/04/2020   VLDL 28 11/04/2020   LDLCALC 71 11/04/2020   LDLCALC 48 06/06/2020    Physical Findings: AIMS:  , ,  ,  ,    CIWA:    COWS:     Musculoskeletal: Strength & Muscle Tone: within normal limits Gait & Station: normal Patient leans: N/A  Psychiatric Specialty Exam:  Presentation  General Appearance: Appropriate for Environment; Casual  Eye  Contact:Good  Speech:Clear and Coherent; Normal Rate  Speech Volume:Normal  Handedness:Right   Mood and Affect  Mood:Irritable  Affect:Congruent   Thought Process  Thought Processes:Goal Directed; Coherent  Descriptions of Associations:Intact  Orientation:Full (Time, Place and Person)  Thought Content:Logical; Perseveration  History of Schizophrenia/Schizoaffective disorder:No  Duration of Psychotic Symptoms:Less than six months  Hallucinations:Denies  Ideas of Reference:None  Suicidal Thoughts:Denies  Homicidal Thoughts:Denies   Sensorium  Memory:Immediate Good; Recent Good; Remote Good  Judgment:Impaired  Insight:Shallow   Executive Functions  Concentration:Fair  Attention Span:Fair  [place] of Knowledge:Good  Language:Good   Psychomotor Activity  Psychomotor Activity:No data recorded   Assets  Assets:Communication Skills; Desire for Improvement; Resilience; Social Support; Talents/Skills   Sleep  Sleep:Fair, 8.25    Physical Exam: Physical Exam ROS Blood pressure 130/85, pulse 60, temperature 98 F (36.7 C), temperature source Oral, resp. rate 17, height 6\' 1"  (1.854 m), weight 49.9 kg, SpO2 100 %. Body mass index is 14.51 kg/m.   Treatment Plan Summary: Daily contact with patient to assess and evaluate symptoms and progress in treatment and Medication management 60 year old male presenting with worsening mood in the context of medication noncompliance and relapse on cocaine.  Continue aspirin 81 mg daily for history of CVA.  Continue Lipitor 80 mg every evening for hyperlipidemia.  Continue Celexa 10 mg daily and Seroquel 300 mg at bedtime for mood.  Flexeril 5 mg at bedtime and gabapentin 400 mg 3 times daily for neuropathic pain.  Continue lisinopril 20 mg daily for hypertension.  Protonix 40 mg twice daily with meals and outpatient follow-up for acid reflux. Continue Compazine 5 mg Q6hr PRN for migraine. Continue  Augmentin for ear infection.   11/10/20: Psychiatric exam above reviewed and remains accurate. Assessment and plan above reviewed and updated.     [name], MD 11/10/2020, 10:09 AM

## 2020-11-10 NOTE — Progress Notes (Signed)
Recreation Therapy Notes   Date: 11/10/2020  Time: 10:30 am   Location: Craft room  Behavioral response: Appropriate   Intervention Topic: Relaxation   Discussion/Intervention:  Group content today was focused on relaxation. The group defined relaxation and identified healthy ways to relax. Individuals expressed how much time they spend relaxing. Patients expressed how much their life would be if they did not make time for themselves to relax. The group stated ways they could improve their relaxation techniques in the future.  Individuals participated in the intervention "Time to Relax" where they had a chance to experience different relaxation techniques.  Clinical Observations/Feedback: Patient came to group  late and stayed for five minutes, he left early due to unknown reasons and never returned.  Fidel Caggiano LRT/CTRS         [name] 11/10/2020 12:55 PM

## 2020-11-10 NOTE — Progress Notes (Signed)
Patient is calm and cooperative with assessment. He denies suicidal ideations, homicidal ideations, and auditory and visual hallucinations. He does not request anything for pain this morning. Patient states that he was having stomach problems all night and has been awake since 2 am. However, he states that the nausea went away before breakfast. Patient is observed to be interacting appropriately with staff and other patients on the unit. Support and encouragement provided. Patient remains safe on the unit at this time.

## 2020-11-10 NOTE — BHH Group Notes (Signed)
  LCSW Group Therapy Note     11/10/2020 2:49 PM     Type of Therapy/Topic:  Group Therapy:  Emotion Regulation     Participation Level:  Did Not Attend     Description of Group:   The purpose of this group is to assist patients in learning to regulate negative emotions and experience positive emotions. Patients will be guided to discuss ways in which they have been vulnerable to their negative emotions. These vulnerabilities will be juxtaposed with experiences of positive emotions or situations, and patients will be challenged to use positive emotions to combat negative ones. Special emphasis will be placed on coping with negative emotions in conflict situations, and patients will process healthy conflict resolution skills.     Therapeutic Goals:  1.    Patient will identify two positive emotions or experiences to reflect on in order to balance out negative emotions  2.    Patient will label two or more emotions that they find the most difficult to experience  3.    Patient will demonstrate positive conflict resolution skills through discussion and/or role plays     Summary of Patient Progress:    X  Therapeutic Modalities:   Cognitive Behavioral Therapy  Feelings Identification  Dialectical Behavioral Therapy   Samuel Molina, MSW, [place]  11/10/2020 2:49 PM

## 2020-11-11 MED ORDER — AMOXICILLIN-POT CLAVULANATE 875-125 MG PO TABS: 1.0000 | ORAL_TABLET | Freq: Two times a day (BID) | ORAL | 0 refills | Status: DC

## 2020-11-11 MED ORDER — CITALOPRAM HYDROBROMIDE 10 MG PO TABS: 10.0000 mg | ORAL_TABLET | Freq: Every day | ORAL | 1 refills | Status: DC

## 2020-11-11 MED ORDER — QUETIAPINE FUMARATE 300 MG PO TABS: 300.0000 mg | ORAL_TABLET | Freq: Every day | ORAL | 1 refills | Status: DC

## 2020-11-11 MED ORDER — LISINOPRIL 20 MG PO TABS: 20.0000 mg | ORAL_TABLET | Freq: Every day | ORAL | 1 refills | Status: DC

## 2020-11-11 MED ORDER — GABAPENTIN 400 MG PO CAPS: 400.0000 mg | ORAL_CAPSULE | Freq: Three times a day (TID) | ORAL | 1 refills | Status: DC

## 2020-11-11 NOTE — Progress Notes (Signed)
Pt was educated on prescriptions and follow up care. Pt questions were answered and pt verbalized understanding and did not voice any concerns. Pt's belongings were returned. Pt was safely discharged to the [facility] by [facility]. Patient was not observed to be in any distress at time of discharge.

## 2020-11-11 NOTE — BHH Suicide Risk Assessment (Signed)
[facility] Discharge Suicide Risk Assessment   Principal Problem: Severe recurrent major depression without psychotic features [facility]) Discharge Diagnoses: Principal Problem:   Severe recurrent major depression without psychotic features ([place]) Active Problems:   Gastroesophageal reflux disease with esophagitis   Cocaine abuse ([place])   Substance induced mood disorder (H[facility])   HTN (hypertension)   HLD (hyperlipidemia)   Ear drainage   Total Time spent with patient: 35 minutes- 25 minutes face-to-face contact with patient, 10 minutes documentation, coordination of care, scripts   Musculoskeletal: Strength & Muscle Tone: within normal limits Gait & Station: normal Patient leans: N/A  Psychiatric Specialty Exam  Presentation  General Appearance: Appropriate for Environment; Casual  Eye Contact:Good  Speech:Clear and Coherent; Normal Rate  Speech Volume:Normal  Handedness:Right   Mood and Affect  Mood:Euthymic  Duration of Depression Symptoms: Less than two weeks  Affect:Congruent   Thought Process  Thought Processes:Coherent; Goal Directed  Descriptions of Associations:Intact  Orientation:Full (Time, Place and Person)  Thought Content:Logical  History of Schizophrenia/Schizoaffective disorder:No  Duration of Psychotic Symptoms:Less than six months  Hallucinations:Hallucinations: None  Ideas of Reference:None  Suicidal Thoughts:Suicidal Thoughts: No  Homicidal Thoughts:Homicidal Thoughts: No   Sensorium  Memory:Immediate Good; Recent Good; Remote Good  Judgment:Intact  Insight:Shallow   Executive Functions  Concentration:Fair  Attention Span:Fair  [place]   Psychomotor Activity  Psychomotor Activity:Psychomotor Activity: Normal   Assets  Assets:Communication Skills; Desire for Improvement; Housing; Social Support   Sleep  Sleep:Sleep: Good Number of Hours of Sleep: 8.15   Physical  Exam: Physical Exam ROS Blood pressure (!) 137/99, pulse 64, temperature 98.2 F (36.8 C), temperature source Oral, resp. rate 17, height 6\' 1"  (1.854 m), weight 49.9 kg, SpO2 100 %. Body mass index is 14.51 kg/m.  Mental Status Per Nursing Assessment::   On Admission:  Suicidal ideation indicated by patient  Demographic Factors:  Male  Loss Factors: NA  Historical Factors: Impulsivity  Risk Reduction Factors:   Sense of responsibility to family, Positive social support, Positive therapeutic relationship, and Positive coping skills or problem solving skills  Continued Clinical Symptoms:  Depression:   Recent sense of peace/wellbeing Previous Psychiatric Diagnoses and Treatments Medical Diagnoses and Treatments/Surgeries  Cognitive Features That Contribute To Risk:  None    Suicide Risk:  Minimal: No identifiable suicidal ideation.  Patients presenting with no risk factors but with morbid ruminations; may be classified as minimal risk based on the severity of the depressive symptoms    Plan Of Care/Follow-up recommendations:  Activity:  as tolerated Diet:  low sodium heart healthy diet  [name], MD 11/11/2020, 8:59 AM

## 2020-11-11 NOTE — Progress Notes (Signed)
Recreation Therapy Notes  INPATIENT RECREATION TR PLAN  Patient Details Name: Samuel Molina MRN: [id number] DOB: 08-29-1960 Today's Date: 11/11/2020  Rec Therapy Plan Is patient appropriate for Therapeutic Recreation?: Yes Treatment times per week: at least 3 Estimated Length of Stay: 5-7 days TR Treatment/Interventions: Group participation (Comment)  Discharge Criteria Pt will be discharged from therapy if:: Discharged Treatment plan/goals/alternatives discussed and agreed upon by:: Patient/family  Discharge Summary Short term goals set: Patient will engage in interactions with peers and staff in pro-social manner at least 2x within 5 recreation therapy group sessions Short term goals met: Not met Progress toward goals comments: Groups attended Which groups?: AAA/T, Other (Comment) (Relaxation) Reason goals not met: Patient spent most of his time in his room Therapeutic equipment acquired: N/A Reason patient discharged from therapy: Discharge from hospital Pt/family agrees with progress & goals achieved: Yes Date patient discharged from therapy: 11/11/20   Shabreka Coulon 11/11/2020, 12:07 PM

## 2020-11-11 NOTE — Progress Notes (Signed)
  [facility] Adult Case Management Discharge Plan :  Will you be returning to the same living situation after discharge:  No. At discharge, do you have transportation home?: Yes,  pt's sister Do you have the ability to pay for your medications: No.  Release of information consent forms completed and in the chart;  Patient's signature needed at discharge.  Patient to Follow up at:  Follow-up Information     [facility] Follow up on 11/12/2020.   Why: You have bed availability at [facility] in [place] on Friday 11/12/20. A train ticket will be purchased and sent to your email address. Contact information: Phone: [phone] Address: disclosed to patient upon arrival                Next level of care provider has access to [place] and Suicide Prevention discussed: Yes,  completed with pt     Has patient been referred to the Quitline?: Patient refused referral  Patient has been referred for addiction treatment: Yes  Samuel Molina, [name] 11/11/2020, 9:57 AM

## 2020-11-11 NOTE — Plan of Care (Signed)
  Problem: Group Participation Goal: STG - Patient will engage in interactions with peers and staff in pro-social manner at least 2x within 5 recreation therapy group sessions Description: STG - Patient will engage in interactions with peers and staff in pro-social manner at least 2x within 5 recreation therapy group sessions 11/11/2020 1205 by Ernest Haber, LRT Outcome: Not Applicable [id number]/45/[id number] by Ernest Haber, LRT Outcome: Not Met (add Reason) Note: Patient spent most of his time in his room.

## 2020-11-11 NOTE — Progress Notes (Signed)
Recreation Therapy Notes  Date: 11/11/2020  Time: 9:30 am  Location: Courtyard   Behavioral response: Appropriate   Intervention Topic: Animal Assisted Therapy   Discussion/Intervention:  Animal Assisted Therapy took place today during group.  Animal Assisted Therapy is the planned inclusion of an animal in a patient's treatment plan. The patients were able to engage in therapy with an animal during group. Participants were educated on what a service dog is and the different between a support dog and a service dog. Patient were informed on how animal needs are similar to people needs. Individuals were enlightened on the process to get a service animal or support animal. Patients got the opportunity to pet the animal and were offered emotional support from the animal and staff.  Clinical Observations/Feedback:  Patient came to group and was on topic and was focused on what peers and staff had to say. Participant shared their experiences and history with animals. Individual was social with peers, staff and animal while participating in group.  Samuel Molina LRT/CTRS         Ivoree Felmlee 11/11/2020 11:55 AM

## 2020-11-11 NOTE — Discharge Summary (Signed)
Physician Discharge Summary Note  Patient:  Samuel Molina is an 60 y.o., male MRN:  [id number] DOB:  02/24/1961 Patient phone:  [phone] (home)  Patient address:   [place] [address],  Total Time spent with patient: 35 minutes- 25 minutes face-to-face contact with patient, 10 minutes documentation, coordination of care, scripts   Date of Admission:  11/02/2020 Date of Discharge: 11/11/2020  Reason for Admission:  -year-old male with history of mood problems and substance abuse presenting for suicidal ideations and homicidal ideations in the context of relapse  Principal Problem: Severe recurrent major depression without psychotic features ([place]) Discharge Diagnoses: Principal Problem:   Severe recurrent major depression without psychotic features ([place]) Active Problems:   Gastroesophageal reflux disease with esophagitis   Cocaine abuse ([place])   Substance induced mood disorder ([place])   HTN (hypertension)   HLD (hyperlipidemia)   Ear drainage   Past Psychiatric History: History of mood disorder and substance abuse.  Patient has multiple prior admissions for which he typically has multiple demands.  However he tends to sabotage his treatment after its arranged.  So far he has not actually followed up with any outpatient appointments or residential substance abuse treatments created for him.  He is also not remain on his medications at discharge.  Multiple threats of suicide in the past.  Past Medical History:  Past Medical History:  Diagnosis Date   Anxiety    Depression    GERD (gastroesophageal reflux disease)    Obesity    Stroke ([place])    Substance abuse ([place])     Past Surgical History:  Procedure Laterality Date   ESOPHAGOGASTRODUODENOSCOPY (EGD) WITH PROPOFOL N/A 09/20/2017   Procedure: ESOPHAGOGASTRODUODENOSCOPY (EGD) WITH PROPOFOL;  Surgeon: Lin Landsman, MD;  Location: [facility] ENDOSCOPY;  Service: Gastroenterology;  Laterality: N/A;   KNEE SURGERY  Right    Family History:  Family History  Problem Relation Age of Onset   Vasculitis Mother    Family Psychiatric  History: Denies Social History:  Social History   Substance and Sexual Activity  Alcohol Use Not Currently     Social History   Substance and Sexual Activity  Drug Use Yes   Types: Cocaine    Social History   Socioeconomic History   Marital status: Single    Spouse name: Not on file   Number of children: Not on file   Years of education: Not on file   Highest education level: Not on file  Occupational History   Not on file  Tobacco Use   Smoking status: Every Day    Packs/day: 0.50    Types: Cigarettes   Smokeless tobacco: Never  Vaping Use   Vaping Use: Never used  Substance and Sexual Activity   Alcohol use: Not Currently   Drug use: Yes    Types: Cocaine   Sexual activity: Not Currently  Other Topics Concern   Not on file  Social History Narrative   Not on file   Social Determinants of Health   Financial Resource Strain: Not on file  Food Insecurity: Not on file  Transportation Needs: Not on file  Physical Activity: Not on file  Stress: Not on file  Social Connections: Not on file    Hospital Course:  60 year old male with history of mood problems and substance abuse presenting for suicidal ideations and homicidal ideations in the context of relapse. He completed acute detox without complication. Restarted on Celexa 10 mg and Seroquel 300 mg QHS for mood,  and showed signs of improvement. He denies SI/HI/AH/VH, and has been accepted to [facility] in [place]. Bus ticket provided. While here he was also seen by the hospitalist service who provided 5-day course of Augment for ear infection. Gastroenterology and Neurology both recommend outpatient follow-up at discharge.   Physical Findings: AIMS: Facial and Oral Movements Muscles of Facial Expression: None, normal Lips and Perioral Area: None, normal Jaw: None,  normal Tongue: None, normal,Extremity Movements Upper (arms, wrists, hands, fingers): None, normal Lower (legs, knees, ankles, toes): None, normal, Trunk Movements Neck, shoulders, hips: None, normal, Overall Severity Severity of abnormal movements (highest score from questions above): None, normal Incapacitation due to abnormal movements: None, normal Patient's awareness of abnormal movements (rate only patient's report): No Awareness, Dental Status Current problems with teeth and/or dentures?: No Does patient usually wear dentures?: No  CIWA:    COWS:     Musculoskeletal: Strength & Muscle Tone: within normal limits Gait & Station: normal Patient leans: N/A   Psychiatric Specialty Exam:  Presentation  General Appearance: Appropriate for Environment; Casual  Eye Contact:Good  Speech:Clear and Coherent; Normal Rate  Speech Volume:Normal  Handedness:Right   Mood and Affect  Mood:Euthymic  Affect:Congruent   Thought Process  Thought Processes:Coherent; Goal Directed  Descriptions of Associations:Intact  Orientation:Full (Time, Place and Person)  Thought Content:Logical  History of Schizophrenia/Schizoaffective disorder:No  Duration of Psychotic Symptoms:Less than six months  Hallucinations:Hallucinations: None  Ideas of Reference:None  Suicidal Thoughts:Suicidal Thoughts: No  Homicidal Thoughts:Homicidal Thoughts: No   Sensorium  Memory:Immediate Good; Recent Good; Remote Good  Judgment:Intact  Insight:Shallow   Executive Functions  Concentration:Fair  Attention Span:Fair  [place]   Psychomotor Activity  Psychomotor Activity:Psychomotor Activity: Normal   Assets  Assets:Communication Skills; Desire for Improvement; Housing; Social Support   Sleep  Sleep:Sleep: Good Number of Hours of Sleep: 8.15    Physical Exam: Physical Exam Vitals and nursing note reviewed.  Constitutional:       Appearance: Normal appearance.  HENT:     Head: Normocephalic and atraumatic.     Right Ear: External ear normal.     Left Ear: External ear normal.     Nose: Nose normal.     Mouth/Throat:     Mouth: Mucous membranes are moist.     Pharynx: Oropharynx is clear.  Eyes:     Extraocular Movements: Extraocular movements intact.     Conjunctiva/sclera: Conjunctivae normal.     Pupils: Pupils are equal, round, and reactive to light.  Cardiovascular:     Rate and Rhythm: Normal rate.     Pulses: Normal pulses.  Pulmonary:     Effort: Pulmonary effort is normal.     Breath sounds: Normal breath sounds.  Abdominal:     General: Abdomen is flat.     Palpations: Abdomen is soft.  Musculoskeletal:        General: No swelling. Normal range of motion.     Cervical back: Normal range of motion and neck supple.  Skin:    General: Skin is warm and dry.  Neurological:     General: No focal deficit present.     Mental Status: He is alert and oriented to person, place, and time.  Psychiatric:        Mood and Affect: Mood normal.        Behavior: Behavior normal.        Thought Content: Thought content normal.  Judgment: Judgment normal.   Review of Systems  Constitutional: Negative.   HENT: Negative.    Eyes: Negative.   Respiratory: Negative.    Cardiovascular: Negative.   Gastrointestinal:  Positive for heartburn. Negative for constipation and diarrhea.  Genitourinary: Negative.   Musculoskeletal: Negative.   Skin: Negative.   Neurological: Negative.   Endo/Heme/Allergies:  Positive for environmental allergies. Does not bruise/bleed easily.  Psychiatric/Behavioral:  Negative for depression, hallucinations, memory loss and suicidal ideas. The patient is not nervous/anxious and does not have insomnia.   Blood pressure (!) 137/99, pulse 64, temperature 98.2 F (36.8 C), temperature source Oral, resp. rate 17, height 6\' 1"  (1.854 m), weight 49.9 kg, SpO2 100 %. Body mass index  is 14.51 kg/m.   Social History   Tobacco Use  Smoking Status Every Day   Packs/day: 0.50   Types: Cigarettes  Smokeless Tobacco Never   Tobacco Cessation:  A prescription for an FDA-approved tobacco cessation medication was offered at discharge and the patient refused   Blood Alcohol level:  Lab Results  Component Value Date   [facility] <10 11/01/2020   ETH <10 25/36/6440    Metabolic Disorder Labs:  Lab Results  Component Value Date   HGBA1C 5.8 (H) 11/04/2020   MPG 119.76 11/04/2020   MPG 114.02 06/06/2020   No results found for: PROLACTIN Lab Results  Component Value Date   CHOL 133 11/04/2020   TRIG 140 11/04/2020   HDL 34 (L) 11/04/2020   CHOLHDL 3.9 11/04/2020   VLDL 28 11/04/2020   LDLCALC 71 11/04/2020   LDLCALC 48 06/06/2020    See Psychiatric Specialty Exam and Suicide Risk Assessment completed by Attending Physician prior to discharge.  Discharge destination:  Home  Is patient on multiple antipsychotic therapies at discharge:  No   Has Patient had three or more failed trials of antipsychotic monotherapy by history:  No  Recommended Plan for Multiple Antipsychotic Therapies: NA  Discharge Instructions     Diet - low sodium heart healthy   Complete by: As directed    Increase activity slowly   Complete by: As directed       Allergies as of 11/11/2020       Reactions   Acetaminophen Itching, Nausea And Vomiting, Rash        Medication List     STOP taking these medications    lidocaine 5 % Commonly known as: LIDODERM   pantoprazole 40 MG tablet Commonly known as: Protonix   Riboflavin 100 MG Caps   tiZANidine 2 MG tablet Commonly known as: ZANAFLEX       TAKE these medications      Indication  amoxicillin-clavulanate 875-125 MG tablet Commonly known as: AUGMENTIN Take 1 tablet by mouth every 12 (twelve) hours.  Indication: Middle Ear Inflammation   aspirin EC 81 MG tablet Take 81 mg by mouth daily. Swallow whole.   Indication: Disease involving Lipid Deposits in the Arteries   atorvastatin 80 MG tablet Commonly known as: LIPITOR Take 80 mg by mouth every evening.  Indication: High Amount of Fats in the Blood   citalopram 10 MG tablet Commonly known as: CELEXA Take 1 tablet (10 mg total) by mouth daily. Start taking on: November 12, 2020 What changed:  medication strength how much to take  Indication: Major Depressive Disorder   diclofenac Sodium 1 % Gel Commonly known as: VOLTAREN Apply 4 g topically 4 (four) times daily.  Indication: Joint Damage causing Pain and Loss of Function  famotidine 20 MG tablet Commonly known as: PEPCID Take 20 mg by mouth 2 (two) times daily.  Indication: Gastroesophageal Reflux Disease   gabapentin 400 MG capsule Commonly known as: NEURONTIN Take 1 capsule (400 mg total) by mouth 3 (three) times daily.  Indication: Neuropathic Pain   lisinopril 20 MG tablet Commonly known as: ZESTRIL Take 1 tablet (20 mg total) by mouth daily. Start taking on: November 12, 2020 What changed:  medication strength how much to take  Indication: High Blood Pressure Disorder   QUEtiapine 300 MG tablet Commonly known as: SEROQUEL Take 1 tablet (300 mg total) by mouth at bedtime. What changed:  medication strength how much to take when to take this Another medication with the same name was removed. Continue taking this medication, and follow the directions you see here.  Indication: Major Depressive Disorder         Follow-up recommendations:  Activity:  as tolerated Diet:  low sodium heart healthy diet  Comments:  Printed 30-day scripts with 1 refill handed to patient at discharge along with a bus ticket to [place] to go to [facility] for residential substance abuse treatment.   Signed: [name], MD 11/11/2020, 9:05 AM

## 2020-11-11 NOTE — Progress Notes (Signed)
Patient alert and oriented x 4, affect is flat but brightens upon approach , he appears less anxious, noted interacting appropriately with peers and staff, he currently denies SI/HI/AVH he is complaint with medication regimen no distress noted. 15 minutes safety checks maintained

## 2020-12-17 ENCOUNTER — Emergency Department
Admission: EM | Admit: 2020-12-17 | Discharge: 2020-12-19 | Discharge status: Against Medical Advice | Payer: 59 | Attending: Emergency Medicine | Admitting: Emergency Medicine

## 2020-12-17 ENCOUNTER — Other Ambulatory Visit: Payer: Self-pay

## 2020-12-17 ENCOUNTER — Encounter: Payer: Self-pay | Admitting: Emergency Medicine

## 2020-12-17 DIAGNOSIS — Z8616 Personal history of COVID-19: Secondary | ICD-10-CM | POA: Insufficient documentation

## 2020-12-17 DIAGNOSIS — F1410 Cocaine abuse, uncomplicated: Secondary | ICD-10-CM | POA: Diagnosis present

## 2020-12-17 DIAGNOSIS — Z79899 Other long term (current) drug therapy: Secondary | ICD-10-CM | POA: Insufficient documentation

## 2020-12-17 DIAGNOSIS — R45851 Suicidal ideations: Secondary | ICD-10-CM | POA: Insufficient documentation

## 2020-12-17 DIAGNOSIS — R45850 Homicidal ideations: Secondary | ICD-10-CM | POA: Insufficient documentation

## 2020-12-17 DIAGNOSIS — F4325 Adjustment disorder with mixed disturbance of emotions and conduct: Secondary | ICD-10-CM | POA: Insufficient documentation

## 2020-12-17 DIAGNOSIS — Z20822 Contact with and (suspected) exposure to covid-19: Secondary | ICD-10-CM | POA: Insufficient documentation

## 2020-12-17 DIAGNOSIS — F17210 Nicotine dependence, cigarettes, uncomplicated: Secondary | ICD-10-CM | POA: Insufficient documentation

## 2020-12-17 DIAGNOSIS — Z8673 Personal history of transient ischemic attack (TIA), and cerebral infarction without residual deficits: Secondary | ICD-10-CM | POA: Insufficient documentation

## 2020-12-17 DIAGNOSIS — R4689 Other symptoms and signs involving appearance and behavior: Secondary | ICD-10-CM

## 2020-12-17 DIAGNOSIS — F609 Personality disorder, unspecified: Secondary | ICD-10-CM | POA: Diagnosis present

## 2020-12-17 DIAGNOSIS — I10 Essential (primary) hypertension: Secondary | ICD-10-CM | POA: Insufficient documentation

## 2020-12-17 DIAGNOSIS — Y900 Blood alcohol level of less than 20 mg/100 ml: Secondary | ICD-10-CM | POA: Insufficient documentation

## 2020-12-17 DIAGNOSIS — F332 Major depressive disorder, recurrent severe without psychotic features: Secondary | ICD-10-CM | POA: Insufficient documentation

## 2020-12-17 DIAGNOSIS — Z7982 Long term (current) use of aspirin: Secondary | ICD-10-CM | POA: Insufficient documentation

## 2020-12-17 LAB — CBC
HCT: 42.8 % (ref 39.0–52.0)
Hemoglobin: 14.3 g/dL (ref 13.0–17.0)
MCH: 27.3 pg (ref 26.0–34.0)
MCHC: 33.4 g/dL (ref 30.0–36.0)
MCV: 81.7 fL (ref 80.0–100.0)
Platelets: 290 10*3/uL (ref 150–400)
RBC: 5.24 MIL/uL (ref 4.22–5.81)
RDW: 15.5 % (ref 11.5–15.5)
WBC: 8.2 10*3/uL (ref 4.0–10.5)
nRBC: 0 % (ref 0.0–0.2)

## 2020-12-17 LAB — ACETAMINOPHEN LEVEL: Acetaminophen (Tylenol), Serum: <10 ug/mL — ABNORMAL LOW (ref 10–30)

## 2020-12-17 LAB — COMPREHENSIVE METABOLIC PANEL
ALT: 16 U/L (ref 0–44)
AST: 20 U/L (ref 15–41)
Albumin: 3.9 g/dL (ref 3.5–5.0)
Alkaline Phosphatase: 92 U/L (ref 38–126)
Anion gap: 6 (ref 5–15)
BUN: 10 mg/dL (ref 6–20)
CO2: 27 mmol/L (ref 22–32)
Calcium: 9 mg/dL (ref 8.9–10.3)
Chloride: 108 mmol/L (ref 98–111)
Creatinine, Ser: 0.82 mg/dL (ref 0.61–1.24)
GFR, Estimated: >60 mL/min (ref >60)
Glucose, Bld: 88 mg/dL (ref 70–99)
Potassium: 3.6 mmol/L (ref 3.5–5.1)
Sodium: 141 mmol/L (ref 135–145)
Total Bilirubin: 1.1 mg/dL (ref 0.3–1.2)
Total Protein: 7.5 g/dL (ref 6.5–8.1)

## 2020-12-17 LAB — ETHANOL: Alcohol, Ethyl (B): <10 mg/dL (ref <10)

## 2020-12-17 LAB — RESP PANEL BY RT-PCR (FLU A&B, COVID) ARPGX2
Influenza A by PCR: NEGATIVE
Influenza B by PCR: NEGATIVE
SARS Coronavirus 2 by RT PCR: NEGATIVE

## 2020-12-17 LAB — SALICYLATE LEVEL: Salicylate Lvl: <7 mg/dL — ABNORMAL LOW (ref 7.0–30.0)

## 2020-12-17 MED ORDER — ASPIRIN EC 81 MG PO TBEC
81.0000 mg | DELAYED_RELEASE_TABLET | Freq: Every day | ORAL | Status: DC
  Administered 2020-12-17 – 2020-12-19 (×3): 81 mg via ORAL
  Filled 2020-12-17 (×4): qty 1

## 2020-12-17 MED ORDER — QUETIAPINE FUMARATE 300 MG PO TABS
300.0000 mg | ORAL_TABLET | Freq: Every day | ORAL | Status: DC
  Administered 2020-12-17 – 2020-12-18 (×2): 300 mg via ORAL
  Filled 2020-12-17 (×2): qty 1

## 2020-12-17 MED ORDER — HALOPERIDOL LACTATE 5 MG/ML IJ SOLN
10.0000 mg | Freq: Once | INTRAMUSCULAR | Status: AC
  Administered 2020-12-17: 10 mg via INTRAMUSCULAR
  Filled 2020-12-17: qty 2

## 2020-12-17 MED ORDER — LISINOPRIL 10 MG PO TABS
20.0000 mg | ORAL_TABLET | Freq: Every day | ORAL | Status: DC
  Administered 2020-12-17 – 2020-12-19 (×3): 20 mg via ORAL
  Filled 2020-12-17 (×4): qty 2

## 2020-12-17 MED ORDER — LORAZEPAM 2 MG/ML IJ SOLN
2.0000 mg | Freq: Once | INTRAMUSCULAR | Status: AC
  Administered 2020-12-17: 2 mg via INTRAMUSCULAR
  Filled 2020-12-17: qty 1

## 2020-12-17 MED ORDER — FAMOTIDINE 20 MG PO TABS
20.0000 mg | ORAL_TABLET | Freq: Two times a day (BID) | ORAL | Status: DC
  Administered 2020-12-17 – 2020-12-19 (×4): 20 mg via ORAL
  Filled 2020-12-17 (×4): qty 1

## 2020-12-17 MED ORDER — CITALOPRAM HYDROBROMIDE 20 MG PO TABS
10.0000 mg | ORAL_TABLET | Freq: Every day | ORAL | Status: DC
  Administered 2020-12-17 – 2020-12-19 (×3): 10 mg via ORAL
  Filled 2020-12-17 (×4): qty 1

## 2020-12-17 MED ORDER — GABAPENTIN 300 MG PO CAPS
400.0000 mg | ORAL_CAPSULE | Freq: Three times a day (TID) | ORAL | Status: DC
  Administered 2020-12-17 – 2020-12-19 (×5): 400 mg via ORAL
  Filled 2020-12-17 (×6): qty 1

## 2020-12-17 MED ORDER — DIPHENHYDRAMINE HCL 50 MG/ML IJ SOLN
50.0000 mg | Freq: Once | INTRAMUSCULAR | Status: AC
  Administered 2020-12-17: 50 mg via INTRAMUSCULAR
  Filled 2020-12-17: qty 1

## 2020-12-17 NOTE — ED Notes (Signed)
Pt in hallway yelling and cursing at security and Dr. Corky [place] at this time.
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
## 2020-12-17 NOTE — ED Notes (Signed)
Pt noted to be standing at A side nursing station near the quad. Pt cursing at staff. Security present with pt. Pt walked around nursing station and sat in front of room 2, pt continued to curse at security when they were attempting to verbally deescalate the patient. This RN went over and explain to patient that we were working on getting him a room but that I needed to him to not curse while in the hallway. Pt continued to curse at this RN, RN told patient that I was not cursing at him so he did not need to curse at me, pt then stated "just stop talking to me woman" and got up and walked to the hallway on the opposite side of the nursing station. Multiple security officers present at this time. Dr. Corky Downs over to speak with pt.

## 2020-12-17 NOTE — ED Provider Notes (Signed)
[facility] Emergency Department Provider Note   ____________________________________________    I have reviewed the triage vital signs and the nursing notes.   HISTORY  Chief Complaint Mental Health Problem     HPI Samuel Molina is a 60 y.o. male with a history of anxiety, depression, drug use who presents today with suicidal ideation and threatening behavior.  Patient is reportedly threatened to hurt his sister as well.  He is screaming and aggressive and threatening staff and myself.  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    Cocaine use disorder, severe, dependence ([place])    TIA (transient ischemic attack) 06/05/2020   Stroke ([place])    Depression    HTN (hypertension)    HLD (hyperlipidemia)    Tobacco abuse    Chest pain    Left-sided weakness    COVID-19 virus infection    Bell's palsy 04/26/2020   Malingering 03/25/2020   Personality disorder ([place]) 02/13/2020   Bursitis 02/13/2020   Substance induced mood disorder ([place]) 02/12/2020   Severe recurrent major depression without psychotic features ([place]) 01/21/2020   Cocaine abuse ([place]) 01/21/2020   Hip pain 01/21/2020   Major depression 01/20/2020   Gastroesophageal reflux disease with esophagitis    Morbid obesity ([place]) 09/19/2017   Arthritis of knee 11/03/2015    Past Surgical History:  Procedure Laterality Date   ESOPHAGOGASTRODUODENOSCOPY (EGD) WITH PROPOFOL N/A 09/20/2017   Procedure: ESOPHAGOGASTRODUODENOSCOPY (EGD) WITH PROPOFOL;  Surgeon: Lin Landsman, MD;  Location: [facility] ENDOSCOPY;  Service: Gastroenterology;  Laterality: N/A;   KNEE SURGERY Right      Prior to Admission medications   Medication Sig Start Date End Date Taking? Authorizing Provider  amoxicillin-clavulanate (AUGMENTIN) 875-125 MG tablet Take 1 tablet by mouth every 12 (twelve) hours. Patient not taking: No sig reported 11/11/20   [name], MD  aspirin EC 81 MG tablet Take 81 mg by mouth daily. Swallow whole.    [provider]  atorvastatin (LIPITOR) 80 MG tablet Take 80 mg by mouth every evening.    [provider]  citalopram (CELEXA) 10 MG tablet Take 1 tablet (10 mg total) by mouth daily. 11/12/20   [name], MD  diclofenac Sodium (VOLTAREN) 1 % GEL Apply 4 g topically 4 (four) times daily.    [provider]  famotidine (PEPCID) 20 MG tablet Take 20 mg by mouth 2 (two) times daily.    [provider]  gabapentin (NEURONTIN) 400 MG capsule Take 1 capsule (400 mg total) by mouth 3 (three) times daily. 11/11/20   [name], MD  lisinopril (ZESTRIL) 20 MG tablet Take 1 tablet (20 mg total) by mouth daily. 11/12/20   [name], MD  QUEtiapine (SEROQUEL) 300 MG tablet Take 1 tablet (300 mg total) by mouth at bedtime. 11/11/20   [name], MD  omeprazole (PRILOSEC OTC) 20 MG tablet Take 2 tablets (40 mg total) by mouth 2 (two) times daily before a meal. 09/19/17 01/29/19  Lin Landsman, MD     Allergies Acetaminophen  Family History  Problem Relation Age of  Onset   Vasculitis Mother     Social History Social History   Tobacco Use   Smoking status: Every Day    Packs/day: 0.50    Types: Cigarettes   Smokeless tobacco: Never  Vaping Use   Vaping Use: Never used  Substance Use Topics   Alcohol use: Not Currently   Drug use: Yes    Types: Cocaine    Comment: last used 12/16/2020    Unable to obtain review of Systems     ____________________________________________   PHYSICAL EXAM:  VITAL SIGNS: ED Triage Vitals  Enc Vitals Group     BP 12/17/20 0749 (!) 164/92     Pulse Rate  12/17/20 0749 (!) 57     Resp 12/17/20 0749 16     Temp 12/17/20 0749 97.8 F (36.6 C)     Temp Source 12/17/20 0749 Oral     SpO2 12/17/20 0749 100 %     Weight 12/17/20 0744 49.9 kg (110 lb 0.2 oz)     Height 12/17/20 0744 1.854 m ('6\' 1"'$ )     Head Circumference --      Peak Flow --      Pain Score 12/17/20 0744 0     Pain Loc --      Pain Edu? --      Excl. in [place]? --     Constitutional: Alert and oriented.  Aggressive Eyes: Conjunctivae are normal.   Nose: No congestion/rhinnorhea. Mouth/Throat: Mucous membranes are moist.    Cardiovascular: Good peripheral circulation. Respiratory: Normal respiratory effort.  No retractions.   Musculoskeletal: No pain with range of motion warm and well perfused Neurologic:  Normal speech and language. No gross focal neurologic deficits are appreciated.  Skin:  Skin is warm, dry and intact. No rash noted. Psychiatric: Aggressive and threatening, stalking her in the emergency department, multiple security guards trying to guide him to room, screaming and cursing loudly  ____________________________________________   LABS (all labs ordered are listed, but only abnormal results are displayed)  Labs Reviewed  SALICYLATE LEVEL - Abnormal; Notable for the following components:      Result Value   Salicylate Lvl Q000111Q (*)    All other components within normal limits  ACETAMINOPHEN LEVEL - Abnormal; Notable for the following components:   Acetaminophen (Tylenol), Serum <10 (*)    All other components within normal limits  RESP PANEL BY RT-PCR (FLU A&B, COVID) ARPGX2  COMPREHENSIVE METABOLIC PANEL  ETHANOL  CBC  URINE DRUG SCREEN, QUALITATIVE ([facility] ONLY)   ____________________________________________  EKG   ____________________________________________  RADIOLOGY   ____________________________________________   PROCEDURES  Procedure(s) performed: No  Procedures   Critical Care performed: yes  CRITICAL CARE Performed by:  Lavonia Drafts   Total critical care time: 48mnutes  Critical care time was exclusive of separately billable procedures and treating other patients.  Critical care was necessary to treat or prevent imminent or life-threatening deterioration.  Critical care was time spent personally by me on the following activities: development of treatment plan with patient and/or surrogate as well as nursing, discussions with consultants, evaluation of patient's response to treatment, examination of patient, obtaining history from patient or surrogate, ordering and performing treatments and interventions, ordering and review of laboratory studies, ordering and review of radiographic studies, pulse oximetry and re-evaluation of patient's condition.  ____________________________________________   INITIAL IMPRESSION / ASSESSMENT AND PLAN / ED COURSE  Pertinent labs & imaging results that were available during my care of the patient were reviewed  by me and considered in my medical decision making (see chart for details).   Patient with psychiatric and substance abuse history presents with aggressive threatening behavior with SI and HI.  Multiple security guards attempting to corral him into a room, he is cursing at me and threatening to "beat the shit out of me ".  IVC completed as the patient is clearly having a psychiatric emergency.  Ordered IM Haldol, IM Ativan and IM Benadryl for sedation for patient and staff safety.  Lab work is reassuring, the patient is medically cleared for psychiatric evaluation.  The patient has been placed in psychiatric observation due to the need to provide a safe environment for the patient while obtaining psychiatric consultation and evaluation, as well as ongoing medical and medication management to treat the patient's condition.  The patient has been placed under full IVC at this time.     ____________________________________________   FINAL CLINICAL IMPRESSION(S) / ED  DIAGNOSES  Final diagnoses:  Aggressive behavior  Suicidal ideations  Homicidal ideation        Note:  This document was prepared using Dragon voice recognition software and may include unintentional dictation errors.    Lavonia Drafts, MD 12/17/20 [id number]

## 2020-12-17 NOTE — ED Notes (Signed)
VOL, pend dispo 

## 2020-12-17 NOTE — ED Notes (Signed)
Patient states he gets aggressive when annoyed or when around loud noise.  States " Do not put me in the hall back there".  Told patient that I was not sure what room he would be going to because all were full at this time.

## 2020-12-17 NOTE — ED Triage Notes (Signed)
Arrives with C/O wanting to kill sister today and having suicidal thoughts.  AAOx3.  Skin warm and dry.  Eating breakfast in triage.  Rocking back and forth.  Cooperative.

## 2020-12-17 NOTE — ED Notes (Signed)
Pt given dinner tray and ginger ale.

## 2020-12-17 NOTE — ED Notes (Signed)
I lanyard with name on card Pair of white running shoes Pair of white socks Pair of jeans Grey underware Brown belt [employer]

## 2020-12-17 NOTE — ED Notes (Signed)
Pt walked out of room 26 to this RNs desk. Pt apologized for yelling and cursing. Pt states that he is just having a bad day. Pt is tearful. Pt apologized to other staff in the nursing station. This RN thanked patient for apologizing and informed pt that we were getting him some medication to help him. Pt verbalized understanding and returned to his room.

## 2020-12-17 NOTE — ED Notes (Signed)
IVC PENDING  CONSULT ?

## 2020-12-18 NOTE — ED Notes (Signed)
Report received from [place], Conservation officer, nature. Patient alert and oriented, warm and dry, and in no acute distress. Patient denies SI, HI, AVH and pain. Patient made aware of Q15 minute rounds and Engineer, drilling presence for their safety. Patient instructed to come to this nurse with needs or concerns.

## 2020-12-18 NOTE — ED Notes (Signed)
Breakfast tray provided. 

## 2020-12-18 NOTE — ED Notes (Signed)
Unable to admin PO meds due to pt sleeping. Will hold until he wakes up.

## 2020-12-18 NOTE — ED Provider Notes (Signed)
Emergency Medicine Observation Re-evaluation Note  Samuel Molina is a 60 y.o. male, seen on rounds today.  Pt initially presented to the ED for complaints of Mental Health Problem Currently, the patient is sleeping comfortably, denies complaints when woken.  Physical Exam  BP (!) 115/59 (BP Location: Left Arm)   Pulse (!) 52   Temp 97.9 F (36.6 C) (Oral)   Resp 18   Ht '6\' 1"'$  (1.854 m)   Wt 49.9 kg   SpO2 97%   BMI 14.51 kg/m  Physical Exam Constitutional: Resting comfortably. Eyes: Conjunctivae are normal. Head: Atraumatic. Nose: No congestion/rhinnorhea. Mouth/Throat: Mucous membranes are moist. Neck: Normal ROM Cardiovascular: No cyanosis noted. Respiratory: Normal respiratory effort. Gastrointestinal: Non-distended. Genitourinary: deferred Musculoskeletal: No lower extremity tenderness nor edema. Neurologic:  Normal speech and language. No gross focal neurologic deficits are appreciated. Skin:  Skin is warm, dry and intact. No rash noted.   ED Course / MDM  EKG:   I have reviewed the labs performed to date as well as medications administered while in observation.  Recent changes in the last 24 hours include none.  Plan  Current plan is for further evaluation per social work, patient cleared by psych.  Giavonni Headlee is not under involuntary commitment.     Blake Divine, MD 12/18/20 [phone]

## 2020-12-18 NOTE — TOC Initial Note (Signed)
Transition of Care [facility]) - Initial/Assessment Note    Patient Details  Name: Samuel Molina MRN: [id number] Date of Birth: 27-Feb-1961  [facility]) CM/SW Contact:    Elliot Gurney [place], [place] Phone Number: 12/18/2020, 2:45 PM  Clinical Narrative:                 Consult received to assist with sober living placement. Met with patient at bedside who states that he would like to return to [facility]. Patient states that he has been there twice before, the last stay was in January. Patient states that he is currently homeless, with no family support-no additional services noted. Phone call to [facility] [phone] to discuss patient's return.  VM left for a return call-closed at 2pm today. Per patient, they have multiple locations, patient agreeable to any available bed accept [place].  Transition of Care to continue to follow  [facility], LCSW Transition of Care [phone]   Expected Discharge Plan:  ([facility]) Barriers to Discharge: Continued Medical Work up   Patient Goals and CMS Choice Patient states their goals for this hospitalization and ongoing recovery are:: "I want to go back to [facility]"      Expected Discharge Plan and Services Expected Discharge Plan:  ([facility]) In-house Referral: Clinical Social Work     Living arrangements for the past 2 months: Homeless                                      Prior Living Arrangements/Services Living arrangements for the past 2 months: Homeless   Patient language and need for interpreter reviewed:: Yes          Care giver support system in place?: No (comment)   Criminal Activity/Legal Involvement Pertinent to Current Situation/Hospitalization: No - Comment as needed  Activities of Daily Living      Permission Sought/Granted                  Emotional Assessment Appearance:: Appears stated  age Attitude/Demeanor/Rapport: Apprehensive Affect (typically observed): Agitated, Accepting Orientation: : Oriented to Self, Oriented to Place, Oriented to  Time, Oriented to Situation Alcohol / Substance Use: Illicit Drugs Psych Involvement: Yes (comment)  Admission diagnosis:  SI  Patient Active Problem List   Diagnosis Date Noted   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   Ear drainage 11/07/2020   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    Cocaine use disorder, severe, dependence ([place])    TIA (transient ischemic attack) 06/05/2020   Stroke ([place])    Depression    HTN (hypertension)    HLD (hyperlipidemia)    Tobacco abuse    Chest pain    Left-sided weakness    COVID-19 virus infection    Bell's palsy 04/26/2020   Malingering 03/25/2020   Personality disorder ([place]) 02/13/2020   Bursitis 02/13/2020   Substance induced mood disorder ([place]) 02/12/2020   Severe recurrent major depression without psychotic features ([place]) 01/21/2020   Cocaine abuse ([place]) 01/21/2020   Hip pain 01/21/2020   Major depression 01/20/2020   Gastroesophageal reflux disease with esophagitis    Morbid obesity ([place]) 09/19/2017   Arthritis of knee 11/03/2015   PCP:  Pcp, No Pharmacy:   [facility] [place] [address]  [place] [address] Phone: [phone] Fax: [phone]  [place] [address] Phone: [phone] Fax: [phone]     Social Determinants of Health (SDOH) Interventions    Readmission Risk Interventions Readmission Risk Prevention Plan 06/08/2020  Transportation Screening Complete  Medication Review (RN Care Manager) Complete  PCP or Specialist appointment within 3-5 days of discharge Not Complete  PCP/Specialist Appt Not Complete comments patient moving to [facility] or [place] Not Complete   HRI or Home Care Consult Pt Refusal Comments NA  SW Recovery Care/Counseling Consult Complete  Palliative Care Screening Not [place] Not Applicable  Some recent data might be hidden

## 2020-12-19 NOTE — ED Notes (Signed)
Pt walked out angrily refusing to wait for discharge papers and refusing resource list from [facility].

## 2020-12-19 NOTE — TOC Transition Note (Addendum)
Transition of Care [facility]) - CM/SW Discharge Note   Patient Details  Name: Samuel Molina MRN: [id number] Date of Birth: 1960/10/19  Transition of Care [facility]) CM/SW Contact:  Elliot Gurney [place], [place] Phone [phone] 12/19/2020, 10:51 AM   Clinical Narrative:    Patient to discharge today. Patient cleared medically and psychiatrically. [place] List provided to RN who will give to patient. Patient can continue to pursue bed at [facility]   [place], [place] Transition of Care [phone]    Final next level of care: Other (comment) (sober living) Barriers to Discharge: Continued Medical Work up   Patient Goals and CMS Choice Patient states their goals for this hospitalization and ongoing recovery are:: "I want to go back to [facility]"      Discharge Placement                       Discharge Plan and Services In-house Referral: Clinical Social Work                                   Social Determinants of Health (SDOH) Interventions     Readmission Risk Interventions Readmission Risk Prevention Plan 06/08/2020  Transportation Screening Complete  Medication Review Press photographer) Complete  PCP or Specialist appointment within 3-5 days of discharge Not Complete  PCP/Specialist Appt Not Complete comments patient moving to [facility] or [place] Not Complete  [place] or Home Care Consult Pt Refusal Comments NA  SW Recovery Care/Counseling Consult Complete  [place] Not Applicable  Some recent data might be hidden

## 2020-12-19 NOTE — ED Notes (Signed)
VOL/pending placement 

## 2020-12-19 NOTE — ED Notes (Signed)
CSW on premesis. Pt now in hallway standing at nurses station.CSW attempted to explain to pt that he is medically and psychiatrically cleared and can be discharged. Pt now cursing, "why the fuck are yall discharging me?!" Attempted to explain that pt has been psych cleared by psychiatrist. Pt now standing in hallway talking to chaplain. Pt was also complaining and cursing about hall shower not working well.

## 2020-12-19 NOTE — ED Notes (Signed)
Pt handed his belongings by ED tech.

## 2020-12-19 NOTE — ED Notes (Signed)
CSW waiting to talk with Dr Archie Balboa to confirm pt will be discharged. Pt in room.

## 2020-12-19 NOTE — ED Notes (Signed)
ED tech going to retrieve pt belongings to give to pt.

## 2020-12-19 NOTE — ED Notes (Signed)
Pt asking "what is the plan" and "how long am I gonna be in this room". Explained we are waiting for placement and pt may be in same room until placed. Pt states "well ya'll gonna have to put me downstairs, I'm not staying in this fucking room!". Told pt will update him if there are any new changes or updates.

## 2020-12-19 NOTE — ED Notes (Signed)
Pt asleep at this time, unable to collect vitals. Will collect pt vitals once awake. 

## 2020-12-19 NOTE — ED Notes (Signed)
Pt was given breakfast tray 

## 2021-03-01 ENCOUNTER — Emergency Department
Admission: EM | Admit: 2021-03-01 | Discharge: 2021-03-02 | Disposition: A | Discharge status: Home / Self Care | Payer: 59 | Attending: Emergency Medicine | Admitting: Emergency Medicine

## 2021-03-01 ENCOUNTER — Other Ambulatory Visit: Payer: Self-pay

## 2021-03-01 DIAGNOSIS — Z79899 Other long term (current) drug therapy: Secondary | ICD-10-CM | POA: Insufficient documentation

## 2021-03-01 DIAGNOSIS — Z046 Encounter for general psychiatric examination, requested by authority: Secondary | ICD-10-CM | POA: Insufficient documentation

## 2021-03-01 DIAGNOSIS — F1994 Other psychoactive substance use, unspecified with psychoactive substance-induced mood disorder: Secondary | ICD-10-CM

## 2021-03-01 DIAGNOSIS — F4320 Adjustment disorder, unspecified: Secondary | ICD-10-CM | POA: Insufficient documentation

## 2021-03-01 DIAGNOSIS — F331 Major depressive disorder, recurrent, moderate: Secondary | ICD-10-CM | POA: Insufficient documentation

## 2021-03-01 DIAGNOSIS — Z8616 Personal history of COVID-19: Secondary | ICD-10-CM | POA: Insufficient documentation

## 2021-03-01 DIAGNOSIS — I10 Essential (primary) hypertension: Secondary | ICD-10-CM | POA: Insufficient documentation

## 2021-03-01 DIAGNOSIS — Z7982 Long term (current) use of aspirin: Secondary | ICD-10-CM | POA: Insufficient documentation

## 2021-03-01 DIAGNOSIS — F17210 Nicotine dependence, cigarettes, uncomplicated: Secondary | ICD-10-CM | POA: Insufficient documentation

## 2021-03-01 LAB — CBC
HCT: 46.1 % (ref 39.0–52.0)
Hemoglobin: 15.2 g/dL (ref 13.0–17.0)
MCH: 26.8 pg (ref 26.0–34.0)
MCHC: 33 g/dL (ref 30.0–36.0)
MCV: 81.3 fL (ref 80.0–100.0)
Platelets: 303 10*3/uL (ref 150–400)
RBC: 5.67 MIL/uL (ref 4.22–5.81)
RDW: 14.2 % (ref 11.5–15.5)
WBC: 9.9 10*3/uL (ref 4.0–10.5)
nRBC: 0 % (ref 0.0–0.2)

## 2021-03-01 LAB — URINE DRUG SCREEN, QUALITATIVE (ARMC ONLY)
Amphetamines, Ur Screen: NOT DETECTED
Barbiturates, Ur Screen: NOT DETECTED
Benzodiazepine, Ur Scrn: NOT DETECTED
Cannabinoid 50 Ng, Ur ~~LOC~~: NOT DETECTED
Cocaine Metabolite,Ur ~~LOC~~: POSITIVE — AB
MDMA (Ecstasy)Ur Screen: NOT DETECTED
Methadone Scn, Ur: NOT DETECTED
Opiate, Ur Screen: NOT DETECTED
Phencyclidine (PCP) Ur S: NOT DETECTED
Tricyclic, Ur Screen: NOT DETECTED

## 2021-03-01 LAB — COMPREHENSIVE METABOLIC PANEL
ALT: 19 U/L (ref 0–44)
AST: 23 U/L (ref 15–41)
Albumin: 4.5 g/dL (ref 3.5–5.0)
Alkaline Phosphatase: 87 U/L (ref 38–126)
Anion gap: 11 (ref 5–15)
BUN: 20 mg/dL (ref 6–20)
CO2: 24 mmol/L (ref 22–32)
Calcium: 9.5 mg/dL (ref 8.9–10.3)
Chloride: 105 mmol/L (ref 98–111)
Creatinine, Ser: 0.91 mg/dL (ref 0.61–1.24)
GFR, Estimated: >60 mL/min (ref >60)
Glucose, Bld: 75 mg/dL (ref 70–99)
Potassium: 3.7 mmol/L (ref 3.5–5.1)
Sodium: 140 mmol/L (ref 135–145)
Total Bilirubin: 1.1 mg/dL (ref 0.3–1.2)
Total Protein: 8.1 g/dL (ref 6.5–8.1)

## 2021-03-01 LAB — SALICYLATE LEVEL: Salicylate Lvl: <7 mg/dL — ABNORMAL LOW (ref 7.0–30.0)

## 2021-03-01 LAB — ACETAMINOPHEN LEVEL: Acetaminophen (Tylenol), Serum: <10 ug/mL — ABNORMAL LOW (ref 10–30)

## 2021-03-01 LAB — ETHANOL: Alcohol, Ethyl (B): <10 mg/dL (ref <10)

## 2021-03-01 MED ORDER — QUETIAPINE FUMARATE 25 MG PO TABS
300.0000 mg | ORAL_TABLET | Freq: Every day | ORAL | Status: DC
  Administered 2021-03-01: 300 mg via ORAL
  Filled 2021-03-01: qty 1

## 2021-03-01 NOTE — ED Notes (Signed)
Voluntary as IVC was overturned by Dr Weber Cooks, nurse, security informed

## 2021-03-01 NOTE — ED Notes (Signed)
Patient provided snack at appropriate snack time.  Pt consumed 100% of snack provided, tolerated well w/o complaints   Trash disposted of appropriately by patient.  

## 2021-03-01 NOTE — ED Notes (Signed)
Pt belongings: Systems developer  Black belt Black cell phone  Reading glasses

## 2021-03-01 NOTE — ED Notes (Signed)
IVC/Consult Pending

## 2021-03-01 NOTE — ED Notes (Signed)
Patient requesting sandwich tray but made aware none are available. Patient given gingerale, graham crackers, and mango icee

## 2021-03-01 NOTE — BH Assessment (Signed)
Comprehensive Clinical Assessment (CCA) Note  03/01/2021 Samuel Molina [id number]  [name], 60 year old male who presents to [facility] ED involuntarily for treatment. Per triage note, Pt presents to ER after alleged suicide attempt.  Pt states he tried to commit suicide by taking too many of one of his meds (pt unable to state what medicine as he states he takes 17 of them). Pt then states he tried to walk in front of a truck.  Pt tried to use his gun but his nephew stopped him and called police.  Pt states he has been going through a lot recently and is very stressed at home.   During TTS assessment pt presents alert and oriented x 4, restless but cooperative, and mood-congruent with affect. The pt does not appear to be responding to internal or external stimuli. Neither is the pt presenting with any delusional thinking. Pt verified the information provided to triage RN.   Pt identifies his main complaint to be that he is stressed out due to personal issues but would not elaborate. "Just life, everything." Patient reports getting upset with friends and family members that tease him regarding his paralysis. Patient states he has had 3 strokes within the past 12 months which sometimes cause him to forget things and affect the left side of his face. Patient reports his family is not supportive yesterday was not a good day. Patient reports the suicidal thoughts are "on and off and they come and go". Patient has history of substance use and admitted to using cocaine yesterday prior to coming to the hospital. Patient reports he is compliant with his medications and takes them as prescribed. Patient reports [facility] at [facility] but does not see a therapist for outpatient services. Pt denies current SI/HI/AH/VH.    Per Barbaraann Share, NP pt does not meet criteria for inpatient psychiatric admission.   Chief Complaint:  Chief Complaint  Patient presents with   Suicide Attempt   Visit Diagnosis:  Depression   CCA Screening, Triage and Referral (STR)  Patient Reported Information How did you hear about [place]? Self  Referral name: Self  Referral phone number: 0 [phone])   Whom do you see for routine medical problems? I don't have a doctor  Practice/Facility Name: No data recorded Practice/Facility Phone Number: No data recorded Name of Contact: No data recorded Contact Number: No data recorded Contact Fax Number: No data recorded Prescriber Name: No data recorded Prescriber Address (if known): No data recorded  What Is the Reason for Your Visit/Call Today? Patient reports having feelings of SI/HI. Patient states he is depressed.  How Long Has This Been Causing You Problems? <Week  What Do You Feel Would Help You the Most Today? Treatment for Depression or other mood problem; Medication(s)   Have You Recently Been in Any Inpatient Treatment (Hospital/Detox/Crisis Center/28-Day Program)? Yes  Name/Location of Program/Hospital:[facility]  How Long Were You There? UTA  When Were You Discharged? 07/25/20 (less than 1 week ago)   Have You Ever Received Services From [employer] Before? Yes  Who Do You See at [facility]? ED & [facility]   Have You Recently Had Any Thoughts About Hurting Yourself? Yes  Are You Planning to Commit Suicide/Harm Yourself At This time? No   Have you Recently Had Thoughts About [place]? Yes  Explanation: Thoughts to kill cousin "if I see him, I will put my hands on him."   Have You Used Any Alcohol or Drugs in the Past 24 Hours?  Yes  How Long Ago Did You Use Drugs or Alcohol? 2000  What Did You Use and How Much? Cocaine-unknown   Do You Currently Have a Therapist/Psychiatrist? No  Name of Therapist/Psychiatrist: No data recorded  Have You Been Recently Discharged From Any Office Practice or Programs? No  Explanation of Discharge From Practice/Program: Was just discharged from [facility] earlier this week     CCA Screening  Triage Referral Assessment Type of Contact: Face-to-Face  Is this Initial or Reassessment? No data recorded Date Telepsych consult ordered in [facility]:  No data recorded Time Telepsych consult ordered in [facility]:  No data recorded  Patient Reported Information Reviewed? Yes  Patient Left Without Being Seen? No data recorded Reason for Not Completing Assessment: No data recorded  Collateral Involvement: None provided   Does Patient Have a [place]? No data recorded Name and Contact of Legal Guardian: Self  If Minor and Not Living with Parent(s), Who has Custody? n/a  Is CPS involved or ever been involved? Never  Is APS involved or ever been involved? Never   Patient Determined To Be At Risk for Harm To Self or Others Based on Review of Patient Reported Information or Presenting Complaint? No  Method: No Plan  Availability of Means: No access or NA  Intent: Vague intent or NA  Notification Required: Identifiable person is aware  Additional Information for Danger to Others Potential: -- (unknown)  Additional Comments for Danger to Others Potential: No data recorded Are There Guns or Other Weapons in Your Home? No  Types of Guns/Weapons: No data recorded Are These Weapons Safely Secured?                            No data recorded Who Could Verify You Are Able To Have These Secured: No data recorded Do You Have any Outstanding Charges, Pending Court Dates, Parole/Probation? no  Contacted To Inform of Risk of Harm To Self or Others: Intended Target for Harm to Others:   Location of Assessment: [facility] ED   Does Patient Present under Involuntary Commitment? 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    Cocaine use disorder, severe, dependence ([place])    TIA (transient ischemic attack) 06/05/2020   Stroke ([place])    Depression    HTN (hypertension)    HLD (hyperlipidemia)    Tobacco abuse    Chest pain    Left-sided weakness    COVID-19 virus infection    Bell's palsy 04/26/2020   Malingering 03/25/2020   Personality disorder ([place]) 02/13/2020   Bursitis 02/13/2020   Substance induced mood disorder ([place]) 02/12/2020   Severe recurrent major depression without psychotic features ([place]) 01/21/2020   Cocaine abuse ([place]) 01/21/2020   Hip pain 01/21/2020   Major depression 01/20/2020   Gastroesophageal reflux disease with esophagitis    Morbid obesity ([place]) 09/19/2017   Arthritis of knee 11/03/2015    Patient Centered Plan: Patient is on  the following Treatment Plan(s):  Depression   Referrals to Alternative Service(s): Referred to Alternative Service(s):   Place:   Date:   Time:    Referred to Alternative Service(s):   Place:   Date:   Time:    Referred to Alternative Service(s):   Place:   Date:   Time:    Referred to Alternative Service(s):   Place:   Date:   Time:     Katoya Amato Glennon Mac, Counselor, LCAS-A

## 2021-03-01 NOTE — Consult Note (Signed)
[facility] Consult   Reason for Consult: Patient reports that he took too many of his pills and tried to walk in front of a truck. Referring Physician: EDP Patient Identification: Samuel Molina MRN:  [id number] Principal Diagnosis: <principal problem not specified> Diagnosis:  Active Problems:   * No active hospital problems. *   Total Time spent with patient: 1 hour  Subjective:   Samuel Molina is a 60 y.o. male patient admitted with reported SI and positive UDS for cocaine.  HPI: Patient seen and chart was reviewed.  Patient is alert.  He is joking around.  He uses sarcasm.  He states that he has passive suicidal thoughts on and off that "come and go."  When asked by this writer if he believes cocaine use may have something to do with it he replies "yes."  He would like to go to treatment for substance use.  Of note, he was inpatient here in July and sent to [facility] [place] from here, but does not mention that.  Patient says that he did have homicidal ideation against no one in particular yesterday but he does not today.  He is very evasive and does not expand on answers to questions related to follow-up resources that he has been given.  He is eager to talk about his adult children and his prior strokes.  Patient denies suicidal ideations at this time.  Denies homicidal ideations.  Denies auditory or visual hallucinations.  Patient can be somewhat irritable and demanding at times.   Past Psychiatric History:   Risk to Self:   Risk to Others:   Prior Inpatient Therapy:   Prior Outpatient Therapy:    Past Medical History:  Past Medical History:  Diagnosis Date   Anxiety    Depression    GERD (gastroesophageal reflux disease)    Obesity    Stroke ([place])    Substance abuse ([place])     Past Surgical History:  Procedure Laterality Date   ESOPHAGOGASTRODUODENOSCOPY (EGD) WITH PROPOFOL N/A 09/20/2017   Procedure: ESOPHAGOGASTRODUODENOSCOPY (EGD) WITH  PROPOFOL;  Surgeon: Lin Landsman, MD;  Location: [facility] ENDOSCOPY;  Service: Gastroenterology;  Laterality: N/A;   KNEE SURGERY Right    Family History:  Family History  Problem Relation Age of Onset   Vasculitis Mother    Family Psychiatric  History: Per admission note of 11-02-2020: "History of mood disorder and substance abuse.  Patient has multiple prior admissions for which he typically has multiple demands.  However he tends to sabotage his treatment after its arranged.  So far he has not actually followed up with any outpatient appointments or residential substance abuse treatments created for him.  He is also not remain on his medications at discharge.  Multiple threats of suicide in the past."  Social History:  Social History   Substance and Sexual Activity  Alcohol Use Not Currently     Social History   Substance and Sexual Activity  Drug Use Yes   Types: Cocaine   Comment: last used 12/16/2020    Social History   Socioeconomic History   Marital status: Single    Spouse name: Not on file   Number of children: Not on file   Years of education: Not on file   Highest education level: Not on file  Occupational History   Not on file  Tobacco Use   Smoking status: Every Day    Packs/day: 0.50    Types: Cigarettes   Smokeless tobacco: Never  Vaping Use   Vaping Use: Never used  Substance and Sexual Activity   Alcohol use: Not Currently   Drug use: Yes    Types: Cocaine    Comment: last used 12/16/2020   Sexual activity: Not Currently  Other Topics Concern   Not on file  Social History Narrative   Not on file   Social Determinants of Health   Financial Resource Strain: Not on file  Food Insecurity: Not on file  Transportation Needs: Not on file  Physical Activity: Not on file  Stress: Not on file  Social Connections: Not on file   Additional Social History:    Allergies:   Allergies  Allergen Reactions   Acetaminophen Itching, Nausea And Vomiting  and Rash    Labs:  Results for orders placed or performed during the hospital encounter of 03/01/21 (from the past 48 hour(s))  Comprehensive metabolic panel     Status: None   Collection Time: 03/01/21  5:19 AM  Result Value Ref Range   Sodium 140 135 - 145 mmol/L   Potassium 3.7 3.5 - 5.1 mmol/L   Chloride 105 98 - 111 mmol/L   CO2 24 22 - 32 mmol/L   Glucose, Bld 75 70 - 99 mg/dL    Comment: Glucose reference range applies only to samples taken after fasting for at least 8 hours.   BUN 20 6 - 20 mg/dL   Creatinine, Ser 0.91 0.61 - 1.24 mg/dL   Calcium 9.5 8.9 - 10.3 mg/dL   Total Protein 8.1 6.5 - 8.1 g/dL   Albumin 4.5 3.5 - 5.0 g/dL   AST 23 15 - 41 U/L   ALT 19 0 - 44 U/L   Alkaline Phosphatase 87 38 - 126 U/L   Total Bilirubin 1.1 0.3 - 1.2 mg/dL   GFR, Estimated >60 >60 mL/min    Comment: (NOTE) Calculated using the CKD-EPI Creatinine Equation (2021)    Anion gap 11 5 - 15    Comment: Performed at [facility], [address] [place], [place] [address]  Ethanol     Status: None   Collection Time: 03/01/21  5:19 AM  Result Value Ref Range   Alcohol, Ethyl (B) <10 <10 mg/dL    Comment: (NOTE) Lowest detectable limit for serum alcohol is 10 mg/dL.  For medical purposes only. Performed at [facility], [address] [place], [place] [address]   Salicylate level     Status: Abnormal   Collection Time: 03/01/21  5:19 AM  Result Value Ref Range   Salicylate Lvl <9.3 (L) 7.0 - 30.0 mg/dL    Comment: Performed at [facility], [address] [place], [place] [address]  Acetaminophen level     Status: Abnormal   Collection Time: 03/01/21  5:19 AM  Result Value Ref Range   Acetaminophen (Tylenol), Serum <10 (L) 10 - 30 ug/mL    Comment: (NOTE) Therapeutic concentrations vary significantly. A range of 10-30 ug/mL  may be an effective concentration for many patients. However, some  are best treated at concentrations outside of this  range. Acetaminophen concentrations >150 ug/mL at 4 hours after ingestion  and >50 ug/mL at 12 hours after ingestion are often associated with  toxic reactions.  Performed at [facility], [address] [place], [place] [address]   cbc     Status: None   Collection Time: 03/01/21  5:19 AM  Result Value Ref Range   WBC 9.9 4.0 - 10.5 K/uL  RBC 5.67 4.22 - 5.81 MIL/uL   Hemoglobin 15.2 13.0 - 17.0 g/dL   HCT 46.1 39.0 - 52.0 %   MCV 81.3 80.0 - 100.0 fL   MCH 26.8 26.0 - 34.0 pg   MCHC 33.0 30.0 - 36.0 g/dL   RDW 14.2 11.5 - 15.5 %   Platelets 303 150 - 400 K/uL   nRBC 0.0 0.0 - 0.2 %    Comment: Performed at [facility], [address], [place], [place] [address]  Urine Drug Screen, Qualitative     Status: Abnormal   Collection Time: 03/01/21  8:39 AM  Result Value Ref Range   Tricyclic, Ur Screen NONE DETECTED NONE DETECTED   Amphetamines, Ur Screen NONE DETECTED NONE DETECTED   MDMA (Ecstasy)Ur Screen NONE DETECTED NONE DETECTED   Cocaine Metabolite,Ur [place] POSITIVE (A) NONE DETECTED   Opiate, Ur Screen NONE DETECTED NONE DETECTED   Phencyclidine (PCP) Ur S NONE DETECTED NONE DETECTED   Cannabinoid 50 Ng, Ur [place] NONE DETECTED NONE DETECTED   Barbiturates, Ur Screen NONE DETECTED NONE DETECTED   Benzodiazepine, Ur Scrn NONE DETECTED NONE DETECTED   Methadone Scn, Ur NONE DETECTED NONE DETECTED    Comment: (NOTE) Tricyclics + metabolites, urine    Cutoff 1000 ng/mL Amphetamines + metabolites, urine  Cutoff 1000 ng/mL MDMA (Ecstasy), urine              Cutoff 500 ng/mL Cocaine Metabolite, urine          Cutoff 300 ng/mL Opiate + metabolites, urine        Cutoff 300 ng/mL Phencyclidine (PCP), urine         Cutoff 25 ng/mL Cannabinoid, urine                 Cutoff 50 ng/mL Barbiturates + metabolites, urine  Cutoff 200 ng/mL Benzodiazepine, urine              Cutoff 200 ng/mL Methadone, urine                   Cutoff 300 ng/mL  The urine drug screen  provides only a preliminary, unconfirmed analytical test result and should not be used for non-medical purposes. Clinical consideration and professional judgment should be applied to any positive drug screen result due to possible interfering substances. A more specific alternate chemical method must be used in order to obtain a confirmed analytical result. Gas chromatography / mass spectrometry (GC/MS) is the preferred confirm atory method. Performed at [facility], [address] [place], [place] [address]     No current facility-administered medications for this encounter.   Current Outpatient Medications  Medication Sig Dispense Refill   amoxicillin-clavulanate (AUGMENTIN) 875-125 MG tablet Take 1 tablet by mouth every 12 (twelve) hours. (Patient not taking: No sig reported) 3 tablet 0   aspirin EC 81 MG tablet Take 81 mg by mouth daily. Swallow whole.     atorvastatin (LIPITOR) 80 MG tablet Take 80 mg by mouth every evening.     citalopram (CELEXA) 10 MG tablet Take 1 tablet (10 mg total) by mouth daily. 30 tablet 1   diclofenac Sodium (VOLTAREN) 1 % GEL Apply 4 g topically 4 (four) times daily.     famotidine (PEPCID) 20 MG tablet Take 20 mg by mouth 2 (two) times daily.     gabapentin (NEURONTIN) 400 MG capsule Take 1 capsule (400 mg total) by mouth 3 (three) times daily. 90 capsule 1   lisinopril (ZESTRIL)  20 MG tablet Take 1 tablet (20 mg total) by mouth daily. 30 tablet 1   QUEtiapine (SEROQUEL) 300 MG tablet Take 1 tablet (300 mg total) by mouth at bedtime. 30 tablet 1    Musculoskeletal: Strength & Muscle Tone: within normal limits Gait & Station: normal Patient leans: N/A   Psychiatric Specialty Exam:  Presentation  General Appearance: Appropriate for Environment; Casual  Eye Contact:Good  Speech:Clear and Coherent; Normal Rate  Speech Volume:Normal  Handedness:Right   Mood and Affect  Mood:Euthymic  Affect:Congruent   Thought Process   Thought Processes:Coherent; Goal Directed  Descriptions of Associations:Intact  Orientation:Full (Time, Place and Person)  Thought Content:Logical  History of Schizophrenia/Schizoaffective disorder:No  Duration of Psychotic Symptoms:Less than six months  Hallucinations:No data recorded Ideas of Reference:None  Suicidal Thoughts:No data recorded Homicidal Thoughts:No data recorded  Sensorium  Memory:Immediate Good; Recent Good; Remote Good  Judgment:Intact  Insight:Shallow   Executive Functions  Concentration:Fair  Attention Span:Fair  [place]   Psychomotor Activity  Psychomotor Activity:No data recorded  Assets  Assets:Communication Skills; Desire for Improvement; Housing; Social Support   Sleep  Sleep:No data recorded  Physical Exam: Physical Exam Vitals and nursing note reviewed.  HENT:     Head: Normocephalic.  Eyes:     General:        Right eye: No discharge.        Left eye: No discharge.  Cardiovascular:     Rate and Rhythm: Normal rate.  Pulmonary:     Effort: Pulmonary effort is normal.  Musculoskeletal:        General: Normal range of motion.     Cervical back: Normal range of motion.  Skin:    General: Skin is dry.  Neurological:     Mental Status: He is alert and oriented to person, place, and time.  Psychiatric:        Attention and Perception: Attention normal.        Mood and Affect: Mood normal.        Speech: Speech normal.        Thought Content: Thought content normal.        Cognition and Memory: Cognition normal.   Review of Systems  Psychiatric/Behavioral:  Positive for depression and substance abuse.   All other systems reviewed and are negative. Blood pressure (!) 118/92, pulse 62, temperature 98 F (36.7 C), temperature source Oral, resp. rate 16, height 6\' 1"  (1.854 m), weight 107.5 kg, SpO2 98 %. Body mass index is 31.27 kg/m.  Treatment Plan Summary: 60 year old  male presenting to the emergency room with thoughts of suicide.  Patient denies any current suicidal thoughts, states that they are passive and "come and ago."  Denies homicidal ideations.  Patient was counseled on outpatient alternatives and he is receptive to that.  Disposition: TTS is attempting referrals to inpatient substance abuse treatment.  Patient does not meet criteria for inpatient psychiatric treatment.  He may be discharged.  Sherlon Handing, NP 03/01/2021 12:22 PM

## 2021-03-01 NOTE — ED Notes (Signed)
Pt provided with ginger ale in styrofoam cup per request.

## 2021-03-01 NOTE — ED Notes (Signed)
Patient given breakfast tray.

## 2021-03-01 NOTE — ED Provider Notes (Signed)
[facility] Emergency Department Provider Note   ____________________________________________   Event Date/Time   First MD Initiated Contact with Patient 03/01/21 0602     (approximate)  I have reviewed the triage vital signs and the nursing notes.   HISTORY  Chief Complaint Suicide Attempt    HPI Samuel Molina is a 60 y.o. male brought to the ED under IVC for allegedly overdosing on one of his medications.  Patient unable to state which medicine as he takes 17 medicines.  Reports taking only 1 or 2 extra around midnight.  Then states he try to walk in front of a truck.  Then try to use his gun but his nephew stopped him and called police.  Medical history of stroke and GERD; prior history of adjustment disorder, substance abuse, anxiety/depression.  Upset because he was with the officer in the [place] area of the ED and wandered back to the major side.  States he has had a stroke previously and wants to be monitored by medical personnel.  Currently voices no medical complaints.     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    Cocaine use disorder, severe, dependence ([place])    TIA (transient ischemic attack) 06/05/2020   Stroke ([place])    Depression    HTN (hypertension)    HLD (hyperlipidemia)    Tobacco abuse    Chest pain    Left-sided weakness    COVID-19 virus infection    Bell's palsy 04/26/2020   Malingering 03/25/2020   Personality disorder ([place]) 02/13/2020   Bursitis 02/13/2020   Substance induced mood disorder ([place]) 02/12/2020   Severe recurrent major depression without psychotic features  ([place]) 01/21/2020   Cocaine abuse ([place]) 01/21/2020   Hip pain 01/21/2020   Major depression 01/20/2020   Gastroesophageal reflux disease with esophagitis    Morbid obesity ([place]) 09/19/2017   Arthritis of knee 11/03/2015    Past Surgical History:  Procedure Laterality Date   ESOPHAGOGASTRODUODENOSCOPY (EGD) WITH PROPOFOL N/A 09/20/2017   Procedure: ESOPHAGOGASTRODUODENOSCOPY (EGD) WITH PROPOFOL;  Surgeon: Lin Landsman, MD;  Location: [facility] ENDOSCOPY;  Service: Gastroenterology;  Laterality: N/A;   KNEE SURGERY Right     Prior to Admission medications   Medication Sig Start Date End Date Taking? Authorizing Provider  amoxicillin-clavulanate (AUGMENTIN) 875-125 MG tablet Take 1 tablet by mouth every 12 (twelve) hours. Patient not taking: No sig reported 11/11/20   [name], MD  aspirin EC 81 MG tablet Take 81 mg by mouth daily. Swallow whole.    [provider]  atorvastatin (LIPITOR) 80 MG tablet Take 80 mg by mouth every evening.    [provider]  citalopram (CELEXA) 10 MG tablet Take 1 tablet (10 mg total) by mouth daily. 11/12/20   [name], MD  diclofenac Sodium (VOLTAREN) 1 % GEL Apply 4 g topically 4 (four) times daily.    [provider]  famotidine (PEPCID) 20 MG tablet Take 20 mg by mouth 2 (two) times daily.    [provider]  gabapentin (NEURONTIN) 400 MG capsule Take 1 capsule (  400 mg total) by mouth 3 (three) times daily. 11/11/20   [name], MD  lisinopril (ZESTRIL) 20 MG tablet Take 1 tablet (20 mg total) by mouth daily. 11/12/20   [name], MD  QUEtiapine (SEROQUEL) 300 MG tablet Take 1 tablet (300 mg total) by mouth at bedtime. 11/11/20   [name], MD  omeprazole (PRILOSEC OTC) 20 MG tablet Take 2 tablets (40 mg total) by mouth 2 (two) times daily before a meal. 09/19/17 01/29/19  Lin Landsman, MD    Allergies Acetaminophen  Family History  Problem Relation Age of Onset   Vasculitis  Mother     Social History Social History   Tobacco Use   Smoking status: Every Day    Packs/day: 0.50    Types: Cigarettes   Smokeless tobacco: Never  Vaping Use   Vaping Use: Never used  Substance Use Topics   Alcohol use: Not Currently   Drug use: Yes    Types: Cocaine    Comment: last used 12/16/2020    Review of Systems  Constitutional: No fever/chills Eyes: No visual changes. ENT: No sore throat. Cardiovascular: Denies chest pain. Respiratory: Denies shortness of breath. Gastrointestinal: No abdominal pain.  No nausea, no vomiting.  No diarrhea.  No constipation. Genitourinary: Negative for dysuria. Musculoskeletal: Negative for back pain. Skin: Negative for rash. Neurological: Negative for headaches, focal weakness or numbness. Psychiatric: Positive for depression with SI.  ____________________________________________   PHYSICAL EXAM:  VITAL SIGNS: ED Triage Vitals  Enc Vitals Group     BP 03/01/21 0518 (!) 158/93     Pulse Rate 03/01/21 0518 74     Resp 03/01/21 0518 18     Temp 03/01/21 0518 97.7 F (36.5 C)     Temp Source 03/01/21 0518 Oral     SpO2 03/01/21 0518 98 %     Weight 03/01/21 0518 237 lb (107.5 kg)     Height 03/01/21 0518 6\' 1"  (1.854 m)     Head Circumference --      Peak Flow --      Pain Score 03/01/21 0528 0     Pain Loc --      Pain Edu? --      Excl. in [place]? --     Constitutional: Alert and oriented. Well appearing and in no acute distress. Eyes: Conjunctivae are bloodshot bilaterally.  PERRL. EOMI. Head: Atraumatic. Nose: No congestion/rhinnorhea. Mouth/Throat: Mucous membranes are moist.   Neck: No stridor.   Cardiovascular: Normal rate, regular rhythm. Grossly normal heart sounds.  Good peripheral circulation. Respiratory: Normal respiratory effort.  No retractions. Lungs CTAB. Gastrointestinal: Soft and nontender. No distention. No abdominal bruits. No CVA tenderness. Musculoskeletal: No lower extremity tenderness nor  edema.  No joint effusions. Neurologic:  Normal speech and language. No gross focal neurologic deficits are appreciated. No gait instability. Skin:  Skin is warm, dry and intact. No rash noted. Psychiatric: Mood and affect are hostile, aggressive. Speech and behavior are hostile, aggressive.  ____________________________________________   LABS (all labs ordered are listed, but only abnormal results are displayed)  Labs Reviewed  SALICYLATE LEVEL - Abnormal; Notable for the following components:      Result Value   Salicylate Lvl <4.5 (*)    All other components within normal limits  ACETAMINOPHEN LEVEL - Abnormal; Notable for the following components:   Acetaminophen (Tylenol), Serum <10 (*)    All other components within normal limits  COMPREHENSIVE METABOLIC PANEL  ETHANOL  CBC  URINE DRUG SCREEN, QUALITATIVE ([facility] ONLY)   ____________________________________________  EKG  ED ECG REPORT I, Aliece Honold J, the attending physician, personally viewed and interpreted this ECG.   Date: 03/01/2021  EKG Time: 0544  Rate: 75  Rhythm: normal sinus rhythm  Axis: Normal  Intervals:none  ST&T Change: Nonspecific T wave changes  ____________________________________________  RADIOLOGY I, Alyene Predmore J, personally viewed and evaluated these images (plain radiographs) as part of my medical decision making, as well as reviewing the written report by the radiologist.  ED MD interpretation: None  Official radiology report(s): No results found.  ____________________________________________   PROCEDURES  Procedure(s) performed (including Critical Care):  Procedures   ____________________________________________   INITIAL IMPRESSION / ASSESSMENT AND PLAN / ED COURSE  As part of my medical decision making, I reviewed the following data within the [place] notes reviewed and incorporated, Labs reviewed, EKG interpreted, Old chart reviewed, A consult was  requested and obtained from this/these consultant(s) Psychiatry, and Notes from prior ED visits     60 year old male brought to the ED under IVC for possible overdose and suicide gesture. The patient has been placed in psychiatric observation due to the need to provide a safe environment for the patient while obtaining psychiatric consultation and evaluation, as well as ongoing medical and medication management to treat the patient's condition.  The patient has been placed under full IVC at this time.       ____________________________________________   FINAL CLINICAL IMPRESSION(S) / ED DIAGNOSES  Final diagnoses:  Adjustment disorder, unspecified type  Moderate episode of recurrent major depressive disorder [facility])     ED Discharge Orders     None        Note:  This document was prepared using Dragon voice recognition software and may include unintentional dictation errors.    Paulette Blanch, MD 03/01/21 [phone]

## 2021-03-01 NOTE — ED Notes (Signed)
VOL  CONSULT  DONE   

## 2021-03-01 NOTE — ED Notes (Signed)
Snack was given

## 2021-03-01 NOTE — ED Notes (Signed)
Patient given pillow and blanket

## 2021-03-01 NOTE — ED Triage Notes (Signed)
Pt presents to ER after alleged suicide attempt.  Pt states he tried to commit suicide by taking too many of one of his meds (pt unable to state what medicine as he states he takes 17 of them). Pt then states he tried to walk in front of a truck.  Pt tried to use his gun but his nephew stopped him and called police.  Pt states he has been going through a lot recently and is very stressed at home.

## 2021-03-01 NOTE — ED Notes (Signed)
Pt. Alert and oriented, warm and dry, in no distress. Pt. Denies SI, and AVH. Pt states having HI thoughts but patient contracts for safety and states he will not act on thoughts. Pt. Encouraged to let nursing staff know of any concerns or needs.

## 2021-03-02 ENCOUNTER — Other Ambulatory Visit: Payer: Self-pay

## 2021-03-02 ENCOUNTER — Emergency Department
Admission: EM | Admit: 2021-03-02 | Discharge: 2021-03-03 | Disposition: A | Discharge status: Home / Self Care | Payer: 59 | Attending: Emergency Medicine | Admitting: Emergency Medicine

## 2021-03-02 DIAGNOSIS — F329 Major depressive disorder, single episode, unspecified: Secondary | ICD-10-CM | POA: Diagnosis present

## 2021-03-02 DIAGNOSIS — F4325 Adjustment disorder with mixed disturbance of emotions and conduct: Secondary | ICD-10-CM | POA: Insufficient documentation

## 2021-03-02 DIAGNOSIS — Z008 Encounter for other general examination: Secondary | ICD-10-CM

## 2021-03-02 DIAGNOSIS — F17210 Nicotine dependence, cigarettes, uncomplicated: Secondary | ICD-10-CM | POA: Insufficient documentation

## 2021-03-02 DIAGNOSIS — Z046 Encounter for general psychiatric examination, requested by authority: Secondary | ICD-10-CM | POA: Insufficient documentation

## 2021-03-02 DIAGNOSIS — R451 Restlessness and agitation: Secondary | ICD-10-CM

## 2021-03-02 DIAGNOSIS — F332 Major depressive disorder, recurrent severe without psychotic features: Secondary | ICD-10-CM | POA: Insufficient documentation

## 2021-03-02 DIAGNOSIS — Z8616 Personal history of COVID-19: Secondary | ICD-10-CM | POA: Insufficient documentation

## 2021-03-02 DIAGNOSIS — Z7982 Long term (current) use of aspirin: Secondary | ICD-10-CM | POA: Insufficient documentation

## 2021-03-02 DIAGNOSIS — I10 Essential (primary) hypertension: Secondary | ICD-10-CM | POA: Insufficient documentation

## 2021-03-02 DIAGNOSIS — Z765 Malingerer [conscious simulation]: Secondary | ICD-10-CM

## 2021-03-02 DIAGNOSIS — R45851 Suicidal ideations: Secondary | ICD-10-CM | POA: Insufficient documentation

## 2021-03-02 DIAGNOSIS — Z79899 Other long term (current) drug therapy: Secondary | ICD-10-CM | POA: Insufficient documentation

## 2021-03-02 MED ORDER — ASPIRIN 81 MG PO CHEW
81.0000 mg | CHEWABLE_TABLET | Freq: Every day | ORAL | Status: DC
  Filled 2021-03-02: qty 1

## 2021-03-02 MED ORDER — QUETIAPINE FUMARATE 25 MG PO TABS
300.0000 mg | ORAL_TABLET | Freq: Every day | ORAL | Status: DC
  Administered 2021-03-02: 300 mg via ORAL
  Filled 2021-03-02: qty 1

## 2021-03-02 MED ORDER — QUETIAPINE FUMARATE 25 MG PO TABS: 300.0000 mg | ORAL_TABLET | Freq: Every day | ORAL | Status: DC

## 2021-03-02 MED ORDER — FAMOTIDINE 20 MG PO TABS: 20.0000 mg | ORAL_TABLET | Freq: Two times a day (BID) | ORAL | Status: DC

## 2021-03-02 MED ORDER — QUETIAPINE FUMARATE 25 MG PO TABS: 25.0000 mg | ORAL_TABLET | Freq: Every day | ORAL | Status: DC

## 2021-03-02 NOTE — ED Provider Notes (Signed)
[facility] Emergency Department Provider Note  Time seen: 3:35 PM  I have reviewed the triage vital signs and the nursing notes.   HISTORY  Chief Complaint Suicidal   HPI Samuel Molina is a 60 y.o. male with a past medical history anxiety, depression, gastric reflux, substance abuse, presents to the emergency department for psychiatric evaluation.  Patient was seen by a psychiatry nurse practitioner yesterday and cleared, they were attempting to place the patient into a detox facility however patient requested discharge earlier today and I discharged the patient.  Patient states he is back saying that he never wanted to be discharged that he is not "right in the head."  Patient states he needs to be evaluated by psychiatry doctor and not a nurse practitioner.  Patient is extremely agitated and verbally abusive but not physically threatening.  Patient has no medical complaints.   Past Medical History:  Diagnosis Date   Anxiety    Depression    GERD (gastroesophageal reflux disease)    Obesity    Stroke 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) 01/21/2020   Hip pain 01/21/2020   Major depression 01/20/2020    Gastroesophageal reflux disease with esophagitis    Morbid obesity ([place]) 09/19/2017   Arthritis of knee 11/03/2015    Past Surgical History:  Procedure Laterality Date   ESOPHAGOGASTRODUODENOSCOPY (EGD) WITH PROPOFOL N/A 09/20/2017   Procedure: ESOPHAGOGASTRODUODENOSCOPY (EGD) WITH PROPOFOL;  Surgeon: Lin Landsman, MD;  Location: [facility] ENDOSCOPY;  Service: Gastroenterology;  Laterality: N/A;   KNEE SURGERY Right     Prior to Admission medications   Medication Sig Start Date End Date Taking? Authorizing Provider  amoxicillin-clavulanate (AUGMENTIN) 875-125 MG tablet Take 1 tablet by mouth every 12 (twelve) hours. Patient not taking: No sig reported 11/11/20   [name], MD  aspirin EC 81 MG tablet Take 81 mg by mouth daily. Swallow whole.    [provider]  atorvastatin (LIPITOR) 80 MG tablet Take 80 mg by mouth every evening.    [provider]  citalopram (CELEXA) 10 MG tablet Take 1 tablet (10 mg total) by mouth daily. 11/12/20   [name], MD  diclofenac Sodium (VOLTAREN) 1 % GEL Apply 4 g topically 4 (four) times daily.    [provider]  famotidine (PEPCID) 20 MG tablet Take 20 mg by mouth 2 (two) times daily.    [provider]  gabapentin (NEURONTIN) 400 MG capsule Take 1 capsule (400 mg total) by mouth 3 (three) times daily. 11/11/20   [name], MD  lisinopril (ZESTRIL) 20 MG tablet Take 1 tablet (20 mg total) by mouth daily. 11/12/20   Domingo Cocking,  Hedwig Morton, MD  QUEtiapine (SEROQUEL) 300 MG tablet Take 1 tablet (300 mg total) by mouth at bedtime. 11/11/20   [name], MD  omeprazole (PRILOSEC OTC) 20 MG tablet Take 2 tablets (40 mg total) by mouth 2 (two) times daily before a meal. 09/19/17 01/29/19  Vanga, Tally Due, MD    Allergies  Allergen Reactions   Acetaminophen Itching, Nausea And Vomiting and Rash    Family History  Problem Relation Age of Onset   Vasculitis Mother     Social History Social History    Tobacco Use   Smoking status: Every Day    Packs/day: 0.50    Types: Cigarettes   Smokeless tobacco: Never  Vaping Use   Vaping Use: Never used  Substance Use Topics   Alcohol use: Not Currently   Drug use: Yes    Types: Cocaine    Comment: last used 12/16/2020    Review of Systems Constitutional: Negative for fever. Cardiovascular: Negative for chest pain. Respiratory: Negative for shortness of breath. Gastrointestinal: Negative for abdominal pain, vomiting  Neurological: Negative for headache All other ROS negative  ____________________________________________   PHYSICAL EXAM:  VITAL SIGNS: ED Triage Vitals  Enc Vitals Group     BP 03/02/21 1158 112/84     Pulse Rate 03/02/21 1158 78     Resp 03/02/21 1158 20     Temp 03/02/21 1158 97.8 F (36.6 C)     Temp Source 03/02/21 1158 Oral     SpO2 03/02/21 1158 97 %     Weight 03/02/21 1159 235 lb 14.3 oz (107 kg)     Height 03/02/21 1159 6\' 1"  (1.854 m)     Head Circumference --      Peak Flow --      Pain Score 03/02/21 1159 0     Pain Loc --      Pain Edu? --      Excl. in [place]? --    Constitutional: Awake alert, agitated.  Yelling at times. Eyes: Normal exam ENT      Head: Normocephalic and atraumatic.      Mouth/Throat: Mucous membranes are moist. Cardiovascular:, Good signs of peripheral circulation. Respiratory: Regular respiratory effort with a regular respiratory rate.  No distress. Gastrointestinal: Nondistended abdomen Musculoskeletal: Moves all extremities well.  No apparent discomfort. Neurologic:  Normal speech and language.  Moves all extremities.  Does appear agitated at times. Skin:  Skin is dry and intact.  Psychiatric: Agitated, vague SI but no acute plan  ____________________________________________   INITIAL IMPRESSION / ASSESSMENT AND PLAN / ED COURSE  Pertinent labs & imaging results that were available during my care of the patient were reviewed by me and considered in my medical  decision making (see chart for details).   Patient presents to the emergency department for psychiatric evaluation.  Patient is requesting to be seen by a psychiatric physician, saying he has not feeling "right in the head."  Patient states at times he feels like he wants to hurt himself but he has no specific plan to do so.  Patient was seen and cleared by psychiatry yesterday.  Does not appear to meet IVC criteria however we will have psychiatry evaluate when they are able to.  Patient had recent lab work performed.  Devarius Nelles was evaluated in Emergency Department on 03/02/2021 for the symptoms described in the history of present illness. He was evaluated in the context of the global COVID-19 pandemic, which necessitated consideration that the patient  might be at risk for infection with the SARS-CoV-2 virus that causes COVID-19. Institutional protocols and algorithms that pertain to the evaluation of patients at risk for COVID-19 are in a state of rapid change based on information released by regulatory bodies including the CDC and federal and state organizations. These policies and algorithms were followed during the patient's care in the ED.  ____________________________________________   FINAL CLINICAL IMPRESSION(S) / ED DIAGNOSES  Psychiatric evaluation   Harvest Dark, MD 03/02/21 1539

## 2021-03-02 NOTE — ED Notes (Signed)
Per charge RN, pt to be moved from waiting area to [place]. This RN called MD Paduchowski to verify pt able to be moved to [facility], MD informed RN that pt was in back at nursing station arguing with staff and yelling. Security and multiple staff to escort pt to [place] [address]. Dorothy RN notified of pt arrival to [place]

## 2021-03-02 NOTE — ED Notes (Signed)
VOL/Pending D/C

## 2021-03-02 NOTE — ED Notes (Signed)
VOL/  PENDING  CONSULT 

## 2021-03-02 NOTE — ED Triage Notes (Signed)
Pt to ED for SI, states was d/c this morning and is back because he is having the same thoughts. Pt states a doctor never saw him. Pt becoming angry and hostile in triage. Screaming "nobody is listening to me" When asked if having HI, pt stated he wants to break this RN neck.  Pt denies plan for SI at this time, states yesterday took nephews gun   Blood work not done at this time d/t pt discharged a few hours ago.

## 2021-03-02 NOTE — ED Notes (Addendum)
Pt dressed out with this RN  and Zach NT. Belongings include: Red shirt [place] phone wallet Blue boxers

## 2021-03-02 NOTE — ED Provider Notes (Signed)
-----------------------------------------   9:30 AM on 03/02/2021 ----------------------------------------- Patient was seen and cleared by psychiatry yesterday.  TTS is working with the patient to attempt inpatient substance use/abuse detox placement.  IVC was rescinded yesterday.  Patient now states he wants to go home does not want to wait for detox placement.  As the patient is voluntary we will discharge from the emergency department.  Medical work-up is largely nonrevealing besides cocaine positive urine drug screen.   Harvest Dark, MD 03/02/21 0930

## 2021-03-02 NOTE — ED Notes (Signed)
Pt noted to be sitting in triage wait area with cellphone in hand taking stuff out of belongings bag. Pt informed he needed to put cellphone back in bag. Pt called this RN "stupid ass bitch, stay the fuck away from me". Belongings bag moved out of reach of pt.

## 2021-03-02 NOTE — Discharge Instructions (Addendum)
You have been seen in the emergency department for a  psychiatric concern. You have been evaluated both medically as well as psychiatrically. Please follow-up with your outpatient resources provided. Return to the emergency department for any worsening symptoms, or any thoughts of hurting yourself or anyone else so that we may attempt to help you. 

## 2021-03-03 DIAGNOSIS — Z765 Malingerer [conscious simulation]: Secondary | ICD-10-CM | POA: Diagnosis not present

## 2021-03-03 NOTE — Discharge Instructions (Addendum)
You have been seen in the emergency department for a  psychiatric concern. You have been evaluated both medically as well as psychiatrically. Please follow-up with your outpatient resources provided.

## 2021-03-03 NOTE — ED Notes (Signed)
Pt given breakfast tray

## 2021-03-03 NOTE — ED Notes (Signed)
Pt given lunch tray.

## 2021-03-03 NOTE — ED Notes (Signed)
Pt observed urinating in an emesis bag and prior to observing this, pt presented this urine as vomit.

## 2021-03-03 NOTE — Consult Note (Addendum)
[facility] Face-to-Face Psychiatry Consult   Reason for Consult:  depression Referring Physician:  EDP Patient Identification: Samuel Molina MRN:  [id number] Principal Diagnosis: Malingering Diagnosis:  Principal Problem:   Malingering Active Problems:   Major depression   Total Time spent with patient: 45 minutes  Subjective:   Samuel Molina is a 60 y.o. male patient admitted with "I need mental health treatment."   HPI:  Patient seen and chart reviewed. Patient has chronic complaints of "mental health problems/depression" expresses anger that "you all keep trying to tell me I just need drug treatment." Writer attempted to assure patient that we have addressed his mental health concerns and the two can  certainly be related. With his last inpatient admission (July 2022), he was prescribed Citalopram, which he insisted we did not. He consistently fails to follow up with outpatient treatment for any kind of mental health or substance use treatment. Was not taking the Citalopram when he presented to ED over the last few days. Patient has been demanding, rude, verbally abuse to staff.  On his previous presentation to the ED on 11/8, TTS tried to get him into residential treatment at [facility], but they would not take him d/t upcoming court date. Patient insists that he "took care of that." Yet, when he was told to go home and get the paperwork from the court and bring it to [facility] for proof, he did not. He came directly back to the ED. There is concern for secondary gain with his frequent presentations to ED. Patient does not meet criteria for psychiatric hospitalization.   Patient is alert and oriented x 4. Has linear, coherent speech. Does not appear to be responding to internal stimuli. Denies HI. "This is the worst hospital that he has ever seen."  Patient did not express any suicidal thoughts to this Probation officer.   Past Psychiatric History: Many ED presentations at this and area hospitals for complaints of  suicidal thoughts.Concern for secondary gain.  Cocaine use disorder.    Risk to Self:   Risk to Others:   Prior Inpatient Therapy:   Prior Outpatient Therapy:    Past Medical History:  Past Medical History:  Diagnosis Date   Anxiety    Depression    GERD (gastroesophageal reflux disease)    Obesity    Stroke ([place])    Substance abuse ([place])     Past Surgical History:  Procedure Laterality Date   ESOPHAGOGASTRODUODENOSCOPY (EGD) WITH PROPOFOL N/A 09/20/2017   Procedure: ESOPHAGOGASTRODUODENOSCOPY (EGD) WITH PROPOFOL;  Surgeon: Lin Landsman, MD;  Location: [facility] ENDOSCOPY;  Service: Gastroenterology;  Laterality: N/A;   KNEE SURGERY Right    Family History:  Family History  Problem Relation Age of Onset   Vasculitis Mother    Family Psychiatric  History: none known  Social History:  Social History   Substance and Sexual Activity  Alcohol Use Not Currently     Social History   Substance and Sexual Activity  Drug Use Yes   Types: Cocaine   Comment: last used 12/16/2020    Social History   Socioeconomic History   Marital status: Single    Spouse name: Not on file   Number of children: Not on file   Years of education: Not on file   Highest education level: Not on file  Occupational History   Not on file  Tobacco Use   Smoking status: Every Day    Packs/day: 0.50    Types: Cigarettes   Smokeless tobacco: Never  Vaping  Use   Vaping Use: Never used  Substance and Sexual Activity   Alcohol use: Not Currently   Drug use: Yes    Types: Cocaine    Comment: last used 12/16/2020   Sexual activity: Not Currently  Other Topics Concern   Not on file  Social History Narrative   Not on file   Social Determinants of Health   Financial Resource Strain: Not on file  Food Insecurity: Not on file  Transportation Needs: Not on file  Physical Activity: Not on file  Stress: Not on file  Social Connections: Not on file   Additional Social History:    Allergies:    Allergies  Allergen Reactions   Acetaminophen Itching, Nausea And Vomiting and Rash    Labs: No results found for this or any previous visit (from the past 48 hour(s)).  Current Facility-Administered Medications  Medication Dose Route Frequency Provider Last Rate Last Admin   aspirin chewable tablet 81 mg  81 mg Oral Daily Rada Hay, MD       famotidine (PEPCID) tablet 20 mg  20 mg Oral BID Rada Hay, MD       QUEtiapine (SEROQUEL) tablet 300 mg  300 mg Oral QHS Rada Hay, MD   300 mg at 03/02/21 2124   Current Outpatient Medications  Medication Sig Dispense Refill   amoxicillin-clavulanate (AUGMENTIN) 875-125 MG tablet Take 1 tablet by mouth every 12 (twelve) hours. (Patient not taking: No sig reported) 3 tablet 0   aspirin EC 81 MG tablet Take 81 mg by mouth daily. Swallow whole.     atorvastatin (LIPITOR) 80 MG tablet Take 80 mg by mouth every evening.     citalopram (CELEXA) 10 MG tablet Take 1 tablet (10 mg total) by mouth daily. 30 tablet 1   diclofenac Sodium (VOLTAREN) 1 % GEL Apply 4 g topically 4 (four) times daily.     famotidine (PEPCID) 20 MG tablet Take 20 mg by mouth 2 (two) times daily.     gabapentin (NEURONTIN) 400 MG capsule Take 1 capsule (400 mg total) by mouth 3 (three) times daily. 90 capsule 1   lisinopril (ZESTRIL) 20 MG tablet Take 1 tablet (20 mg total) by mouth daily. 30 tablet 1   QUEtiapine (SEROQUEL) 300 MG tablet Take 1 tablet (300 mg total) by mouth at bedtime. 30 tablet 1    Musculoskeletal: Strength & Muscle Tone: within normal limits Gait & Station: normal Patient leans: N/A  Psychiatric Specialty Exam:  Presentation  General Appearance: Appropriate for Environment  Eye Contact:Good  Speech:Clear and Coherent  Speech Volume:Increased  Handedness:Right   Mood and Affect  Mood:Angry  Affect:Congruent   Thought Process  Thought Processes:Coherent  Descriptions of  Associations:Intact  Orientation:Full (Time, Place and Person)  Thought Content:WDL  History of Schizophrenia/Schizoaffective disorder:No  Duration of Psychotic Symptoms:Less than six months  Hallucinations:Hallucinations: None Ideas of Reference:None  Suicidal Thoughts:Suicidal Thoughts: No Homicidal Thoughts:Homicidal Thoughts: No  Sensorium  Memory:Immediate Good  Judgment:Poor  Insight:Poor   Executive Functions  Concentration:Fair  Attention Span:Fair  [place]   Psychomotor Activity  Psychomotor Activity:Psychomotor Activity: Normal  Assets  Assets:Resilience   Sleep  Sleep:Sleep: Good  Physical Exam: Physical Exam Vitals and nursing note reviewed.  HENT:     Head: Normocephalic.     Nose: No congestion or rhinorrhea.  Pulmonary:     Effort: Pulmonary effort is normal.  Musculoskeletal:        General: Normal  range of motion.     Cervical back: Normal range of motion.  Skin:    General: Skin is dry.  Neurological:     Mental Status: He is oriented to person, place, and time.  Psychiatric:        Attention and Perception: Attention normal.        Mood and Affect: Mood normal.        Speech: Speech normal.        Behavior: Behavior is agitated.        Thought Content: Thought content normal.        Cognition and Memory: Cognition normal.        Judgment: Judgment is impulsive.     Comments: Patient irritable, demanding   Review of Systems  Psychiatric/Behavioral:  Positive for depression (stable) and substance abuse. Negative for hallucinations, memory loss and suicidal ideas. The patient is not nervous/anxious and does not have insomnia.   Blood pressure 130/65, pulse 65, temperature 98.1 F (36.7 C), temperature source Oral, resp. rate 15, height 6\' 1"  (1.854 m), weight 107 kg, SpO2 100 %. Body mass index is 31.12 kg/m.  Treatment Plan Summary: Patient does not meet criteria for inpatient  psychiatric treatment.   Discussed with [name] Disposition: No evidence of imminent risk to self or others at present.   Patient does not meet criteria for psychiatric inpatient admission. Discussed crisis plan, support from social network, calling 911, coming to the Emergency Department, and calling Suicide Hotline.  Sherlon Handing, NP 03/03/2021 3:18 PM

## 2021-03-03 NOTE — ED Provider Notes (Signed)
-----------------------------------------   2:45 PM on 03/03/2021 ----------------------------------------- Patient has been seen and evaluated by psychiatry they believe the patient is safe for discharge home once again from a psychiatric standpoint.  Patient will be discharged from the emergency department.   Harvest Dark, MD 03/03/21 1445

## 2021-03-03 NOTE — ED Provider Notes (Signed)
Emergency Medicine Observation Re-evaluation Note  Samuel Molina is a 60 y.o. male, seen in the emergency department for suicidal ideation and agitation.  Physical Exam  BP 133/76 (BP Location: Right Arm)   Pulse 80   Temp 99.4 F (37.4 C) (Oral)   Resp 18   Ht 6\' 1"  (1.854 m)   Wt 107 kg   SpO2 98%   BMI 31.12 kg/m    ED Course / MDM   No recent lab work for review  Plan  Current plan is for placement to an appropriate facility.  Samuel Molina is not under involuntary commitment.     Harvest Dark, MD 03/03/21 1350

## 2021-04-25 ENCOUNTER — Emergency Department
Admission: EM | Admit: 2021-04-25 | Discharge: 2021-04-27 | Disposition: A | Discharge status: Home / Self Care | Payer: 59 | Attending: Emergency Medicine | Admitting: Emergency Medicine

## 2021-04-25 ENCOUNTER — Emergency Department: Payer: Self-pay

## 2021-04-25 ENCOUNTER — Other Ambulatory Visit: Payer: Self-pay

## 2021-04-25 DIAGNOSIS — F17210 Nicotine dependence, cigarettes, uncomplicated: Secondary | ICD-10-CM | POA: Insufficient documentation

## 2021-04-25 DIAGNOSIS — Z20822 Contact with and (suspected) exposure to covid-19: Secondary | ICD-10-CM | POA: Insufficient documentation

## 2021-04-25 DIAGNOSIS — R519 Headache, unspecified: Secondary | ICD-10-CM | POA: Insufficient documentation

## 2021-04-25 DIAGNOSIS — T1491XA Suicide attempt, initial encounter: Secondary | ICD-10-CM | POA: Insufficient documentation

## 2021-04-25 DIAGNOSIS — F1410 Cocaine abuse, uncomplicated: Secondary | ICD-10-CM | POA: Insufficient documentation

## 2021-04-25 DIAGNOSIS — M25572 Pain in left ankle and joints of left foot: Secondary | ICD-10-CM | POA: Insufficient documentation

## 2021-04-25 DIAGNOSIS — R29810 Facial weakness: Secondary | ICD-10-CM | POA: Insufficient documentation

## 2021-04-25 DIAGNOSIS — T1490XA Injury, unspecified, initial encounter: Secondary | ICD-10-CM

## 2021-04-25 DIAGNOSIS — Y9302 Activity, running: Secondary | ICD-10-CM | POA: Insufficient documentation

## 2021-04-25 DIAGNOSIS — S3991XA Unspecified injury of abdomen, initial encounter: Secondary | ICD-10-CM | POA: Insufficient documentation

## 2021-04-25 DIAGNOSIS — M542 Cervicalgia: Secondary | ICD-10-CM | POA: Insufficient documentation

## 2021-04-25 LAB — COMPREHENSIVE METABOLIC PANEL
ALT: 12 U/L (ref 0–44)
AST: 17 U/L (ref 15–41)
Albumin: 3.8 g/dL (ref 3.5–5.0)
Alkaline Phosphatase: 109 U/L (ref 38–126)
Anion gap: 8 (ref 5–15)
BUN: 13 mg/dL (ref 6–20)
CO2: 26 mmol/L (ref 22–32)
Calcium: 8.8 mg/dL — ABNORMAL LOW (ref 8.9–10.3)
Chloride: 103 mmol/L (ref 98–111)
Creatinine, Ser: 0.76 mg/dL (ref 0.61–1.24)
GFR, Estimated: >60 mL/min (ref >60)
Glucose, Bld: 127 mg/dL — ABNORMAL HIGH (ref 70–99)
Potassium: 3.6 mmol/L (ref 3.5–5.1)
Sodium: 137 mmol/L (ref 135–145)
Total Bilirubin: 0.5 mg/dL (ref 0.3–1.2)
Total Protein: 7.3 g/dL (ref 6.5–8.1)

## 2021-04-25 LAB — URINE DRUG SCREEN, QUALITATIVE (ARMC ONLY)
Amphetamines, Ur Screen: NOT DETECTED
Barbiturates, Ur Screen: NOT DETECTED
Benzodiazepine, Ur Scrn: NOT DETECTED
Cannabinoid 50 Ng, Ur ~~LOC~~: NOT DETECTED
Cocaine Metabolite,Ur ~~LOC~~: POSITIVE — AB
MDMA (Ecstasy)Ur Screen: NOT DETECTED
Methadone Scn, Ur: NOT DETECTED
Opiate, Ur Screen: NOT DETECTED
Phencyclidine (PCP) Ur S: NOT DETECTED
Tricyclic, Ur Screen: NOT DETECTED

## 2021-04-25 LAB — SALICYLATE LEVEL: Salicylate Lvl: <7 mg/dL — ABNORMAL LOW (ref 7.0–30.0)

## 2021-04-25 LAB — CBC
HCT: 42.6 % (ref 39.0–52.0)
Hemoglobin: 13.8 g/dL (ref 13.0–17.0)
MCH: 26.7 pg (ref 26.0–34.0)
MCHC: 32.4 g/dL (ref 30.0–36.0)
MCV: 82.6 fL (ref 80.0–100.0)
Platelets: 311 10*3/uL (ref 150–400)
RBC: 5.16 MIL/uL (ref 4.22–5.81)
RDW: 14.9 % (ref 11.5–15.5)
WBC: 6.6 10*3/uL (ref 4.0–10.5)
nRBC: 0 % (ref 0.0–0.2)

## 2021-04-25 LAB — ETHANOL: Alcohol, Ethyl (B): <10 mg/dL (ref <10)

## 2021-04-25 LAB — ACETAMINOPHEN LEVEL: Acetaminophen (Tylenol), Serum: <10 ug/mL — ABNORMAL LOW (ref 10–30)

## 2021-04-25 NOTE — ED Notes (Signed)
Pt given sandwich tray. [place], and icee

## 2021-04-25 NOTE — ED Notes (Addendum)
Pt dressed out with this RN and E[place] NT. Belongings include: [employer] Shark shoes [employer] Cellphone [employer] [employer] sweatshirt Black socks Black belt [employer]
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
## 2021-04-26 DIAGNOSIS — F1410 Cocaine abuse, uncomplicated: Secondary | ICD-10-CM | POA: Diagnosis not present

## 2021-04-26 LAB — RESP PANEL BY RT-PCR (FLU A&B, COVID) ARPGX2
Influenza A by PCR: NEGATIVE
Influenza B by PCR: NEGATIVE
SARS Coronavirus 2 by RT PCR: NEGATIVE

## 2021-04-26 NOTE — ED Notes (Signed)
IVC  CONSULT  DONE  PENDING  PLACEMENT 

## 2021-04-26 NOTE — ED Notes (Signed)
Pt requested a soda from staff.  Staff offered H2O between meals and snacks per unit rules.  Pt became agitated and began being verbally aggressive towards staff.  Mr Arizpe requested to speak with unit charge nurse.  Charge nurse notified of Mr. Bergevin's request.

## 2021-04-26 NOTE — BH Assessment (Signed)
Attempted to assess pt; however pt refused to participate. Psych team to follow up in the AM.

## 2021-04-26 NOTE — ED Notes (Signed)
Pt came out of room with emesis bag in hand full of urine.  Staff asked pt to not urinate in emesis bag in room.  Staff again explained to Samuel Molina that he is able to use the restroom 24/7 on the unit, all he has to do is ask.  He stated that he is going to keep the dirty emesis bag "Because if I got to piss and somebody is in the bathroom I'm not waiting and I'm not going to piss on myself."  Staff explained to pt that it is not only a hygiene issue it is in infection control problem as well.  Samuel Molina then stated "If you take it I'm going to piss on the floor in my room"  Staff had security x2 on site @ this time, they removed the dirty emesis bag from [name] and disposed of it properly.

## 2021-04-26 NOTE — BH Assessment (Signed)
Detox  Referral information for detox treatment faxed to:   [facility] [phone] or [phone])   [facility] [phone])   [facility] [phone])   [facility] [phone])  . [facility] [phone]) (Walk-in only)  . [facility] [phone])   [facility] [phone])  . [facility] [phone])  . [facility] [phone])  . [facility] [phone]- NOT accepting patients for the next 14 days.

## 2021-04-26 NOTE — ED Notes (Signed)
Pt. To BHU from ED ambulatory without difficulty, to room  BHU4. Report from Amy RN. Pt. Is alert and oriented, warm and dry in no distress. Pt. Denies SI, and AVH. Pt states HI but states not to anyone here. Patient asking for food. Writer advised patient snack time is over with and I could get him some water. Patient states he does not like water. Pt. Calm and cooperative. Pt. Made aware of security cameras and Q15 minute rounds. Pt. Encouraged to let Nursing staff know of any concerns or needs.

## 2021-04-26 NOTE — ED Notes (Signed)
On initial round after report Pt is resting quietly in room without any s/s of distress.  Will continue to monitor throughout shift as ordered for any changes in behaviors and for continued safety.

## 2021-04-26 NOTE — ED Notes (Signed)
IVC pending consult   

## 2021-04-26 NOTE — ED Notes (Signed)
Md with pt in hallway again

## 2021-04-26 NOTE — ED Notes (Signed)
Pt custody paper work completed

## 2021-04-26 NOTE — Consult Note (Signed)
[place] Psychiatry Consult   Reason for Consult:  Depression in context of substance abuse  Referring Physician:  EDP Patient Identification: Samuel Molina MRN:  [id number] Principal Diagnosis: Cocaine abuse ([place]) Diagnosis:  Principal Problem:   Cocaine abuse ([place])   Total Time spent with patient: 1 hour  Subjective:  "I want substance abuse treatment" Samuel Molina is a 61 y.o. male patient admitted with depression in the context of substance abuse.  HPI: Patient presents to the ED with UDS positive for cocaine.  He admits that his depression is closely linked to his substance use.  He states that he really wants for his substance use problems.  He admits to passive suicidal thoughts on and off but has no plan.  Patient denies current suicidal thoughts.  Denies homicidal ideations.  Denies auditory or visual hallucinations.  Patient verbalizes hope for recovery.  Patient is requesting that we assist him in finding treatment for his substance use.  Patient does not meet criteria for inpatient psychiatric hospitalization.  Past Psychiatric History: History of cocaine use disorder.  MDD.  Risk to Self:   Risk to Others:   Prior Inpatient Therapy:   Prior Outpatient Therapy:    Past Medical History:  Past Medical History:  Diagnosis Date   Anxiety    Depression    GERD (gastroesophageal reflux disease)    Obesity    Stroke ([place])    Substance abuse ([place])     Past Surgical History:  Procedure Laterality Date   ESOPHAGOGASTRODUODENOSCOPY (EGD) WITH PROPOFOL N/A 09/20/2017   Procedure: ESOPHAGOGASTRODUODENOSCOPY (EGD) WITH PROPOFOL;  Surgeon: Lin Landsman, MD;  Location: [facility] ENDOSCOPY;  Service: Gastroenterology;  Laterality: N/A;   KNEE SURGERY Right    Family History:  Family History  Problem Relation Age of Onset   Vasculitis Mother    Family Psychiatric  History: Unknown Social History:  Social History   Substance and Sexual Activity  Alcohol  Use Not Currently     Social History   Substance and Sexual Activity  Drug Use Yes   Types: Cocaine   Comment: last used 12/16/2020    Social History   Socioeconomic History   Marital status: Single    Spouse name: Not on file   Number of children: Not on file   Years of education: Not on file   Highest education level: Not on file  Occupational History   Not on file  Tobacco Use   Smoking status: Every Day    Packs/day: 0.50    Types: Cigarettes   Smokeless tobacco: Never  Vaping Use   Vaping Use: Never used  Substance and Sexual Activity   Alcohol use: Not Currently   Drug use: Yes    Types: Cocaine    Comment: last used 12/16/2020   Sexual activity: Not Currently  Other Topics Concern   Not on file  Social History Narrative   Not on file   Social Determinants of Health   Financial Resource Strain: Not on file  Food Insecurity: Not on file  Transportation Needs: Not on file  Physical Activity: Not on file  Stress: Not on file  Social Connections: Not on file   Additional Social History:    Allergies:   Allergies  Allergen Reactions   Acetaminophen Itching, Nausea And Vomiting and Rash    Labs:  Results for orders placed or performed during the hospital encounter of 04/25/21 (from the past 48 hour(s))  Comprehensive metabolic panel     Status: Abnormal  Collection Time: 04/25/21  4:51 PM  Result Value Ref Range   Sodium 137 135 - 145 mmol/L   Potassium 3.6 3.5 - 5.1 mmol/L   Chloride 103 98 - 111 mmol/L   CO2 26 22 - 32 mmol/L   Glucose, Bld 127 (H) 70 - 99 mg/dL    Comment: Glucose reference range applies only to samples taken after fasting for at least 8 hours.   BUN 13 6 - 20 mg/dL   Creatinine, Ser 0.76 0.61 - 1.24 mg/dL   Calcium 8.8 (L) 8.9 - 10.3 mg/dL   Total Protein 7.3 6.5 - 8.1 g/dL   Albumin 3.8 3.5 - 5.0 g/dL   AST 17 15 - 41 U/L   ALT 12 0 - 44 U/L   Alkaline Phosphatase 109 38 - 126 U/L   Total Bilirubin 0.5 0.3 - 1.2 mg/dL    GFR, Estimated >60 >60 mL/min    Comment: (NOTE) Calculated using the CKD-EPI Creatinine Equation (2021)    Anion gap 8 5 - 15    Comment: Performed at [facility], [address] [place], [place] [address]  Ethanol     Status: None   Collection Time: 04/25/21  4:51 PM  Result Value Ref Range   Alcohol, Ethyl (B) <10 <10 mg/dL    Comment: (NOTE) Lowest detectable limit for serum alcohol is 10 mg/dL.  For medical purposes only. Performed at [facility], [address] [place], [place] [address]   Salicylate level     Status: Abnormal   Collection Time: 04/25/21  4:51 PM  Result Value Ref Range   Salicylate Lvl <7.5 (L) 7.0 - 30.0 mg/dL    Comment: Performed at [facility], [address] [place], [place] [address]  Acetaminophen level     Status: Abnormal   Collection Time: 04/25/21  4:51 PM  Result Value Ref Range   Acetaminophen (Tylenol), Serum <10 (L) 10 - 30 ug/mL    Comment: (NOTE) Therapeutic concentrations vary significantly. A range of 10-30 ug/mL  may be an effective concentration for many patients. However, some  are best treated at concentrations outside of this range. Acetaminophen concentrations >150 ug/mL at 4 hours after ingestion  and >50 ug/mL at 12 hours after ingestion are often associated with  toxic reactions.  Performed at [facility], [address] [place], [place] [address]   cbc     Status: None   Collection Time: 04/25/21  4:51 PM  Result Value Ref Range   WBC 6.6 4.0 - 10.5 K/uL   RBC 5.16 4.22 - 5.81 MIL/uL   Hemoglobin 13.8 13.0 - 17.0 g/dL   HCT 42.6 39.0 - 52.0 %   MCV 82.6 80.0 - 100.0 fL   MCH 26.7 26.0 - 34.0 pg   MCHC 32.4 30.0 - 36.0 g/dL   RDW 14.9 11.5 - 15.5 %   Platelets 311 150 - 400 K/uL   nRBC 0.0 0.0 - 0.2 %    Comment: Performed at [facility], [address], [place], [place] [address]  Urine Drug Screen, Qualitative     Status: Abnormal   Collection Time:  04/25/21  4:51 PM  Result Value Ref Range   Tricyclic, Ur Screen NONE DETECTED NONE DETECTED   Amphetamines, Ur Screen NONE DETECTED NONE DETECTED   MDMA (Ecstasy)Ur Screen NONE DETECTED NONE DETECTED   Cocaine Metabolite,Ur [place] POSITIVE (A) NONE DETECTED   Opiate, Ur Screen NONE DETECTED NONE DETECTED   Phencyclidine (  PCP) Ur S NONE DETECTED NONE DETECTED   Cannabinoid 50 Ng, Ur [place] NONE DETECTED NONE DETECTED   Barbiturates, Ur Screen NONE DETECTED NONE DETECTED   Benzodiazepine, Ur Scrn NONE DETECTED NONE DETECTED   Methadone Scn, Ur NONE DETECTED NONE DETECTED    Comment: (NOTE) Tricyclics + metabolites, urine    Cutoff 1000 ng/mL Amphetamines + metabolites, urine  Cutoff 1000 ng/mL MDMA (Ecstasy), urine              Cutoff 500 ng/mL Cocaine Metabolite, urine          Cutoff 300 ng/mL Opiate + metabolites, urine        Cutoff 300 ng/mL Phencyclidine (PCP), urine         Cutoff 25 ng/mL Cannabinoid, urine                 Cutoff 50 ng/mL Barbiturates + metabolites, urine  Cutoff 200 ng/mL Benzodiazepine, urine              Cutoff 200 ng/mL Methadone, urine                   Cutoff 300 ng/mL  The urine drug screen provides only a preliminary, unconfirmed analytical test result and should not be used for non-medical purposes. Clinical consideration and professional judgment should be applied to any positive drug screen result due to possible interfering substances. A more specific alternate chemical method must be used in order to obtain a confirmed analytical result. Gas chromatography / mass spectrometry (GC/MS) is the preferred confirm atory method. Performed at [facility], [address] [place], [place] [address]   Resp Panel by RT-PCR (Flu A&B, Covid) Nasopharyngeal Swab     Status: None   Collection Time: 04/25/21 10:11 PM   Specimen: Nasopharyngeal Swab; Nasopharyngeal(NP) swabs in vial transport medium  Result Value Ref Range   SARS Coronavirus 2 by RT PCR  NEGATIVE NEGATIVE    Comment: (NOTE) SARS-CoV-2 target nucleic acids are NOT DETECTED.  The SARS-CoV-2 RNA is generally detectable in upper respiratory specimens during the acute phase of infection. The lowest concentration of SARS-CoV-2 viral copies this assay can detect is 138 copies/mL. A negative result does not preclude SARS-Cov-2 infection and should not be used as the sole basis for treatment or other patient management decisions. A negative result may occur with  improper specimen collection/handling, submission of specimen other than nasopharyngeal swab, presence of viral mutation(s) within the areas targeted by this assay, and inadequate number of viral copies(<138 copies/mL). A negative result must be combined with clinical observations, patient history, and epidemiological information. The expected result is Negative.  Fact Sheet for Patients:  EntrepreneurPulse.com.au  Fact Sheet for Healthcare Providers:  IncredibleEmployment.be  This test is no t yet approved or cleared by the [place] FDA and  has been authorized for detection and/or diagnosis of SARS-CoV-2 by FDA under an Emergency Use Authorization (EUA). This EUA will remain  in effect (meaning this test can be used) for the duration of the COVID-19 declaration under Section 564(b)(1) of the Act, 21 U.S.C.section 360bbb-3(b)(1), unless the authorization is terminated  or revoked sooner.       Influenza A by PCR NEGATIVE NEGATIVE   Influenza B by PCR NEGATIVE NEGATIVE    Comment: (NOTE) The Xpert Xpress SARS-CoV-2/FLU/RSV plus assay is intended as an aid in the diagnosis of influenza from Nasopharyngeal swab specimens and should not be used as a sole basis for treatment. Nasal washings and aspirates  are unacceptable for Xpert Xpress SARS-CoV-2/FLU/RSV testing.  Fact Sheet for Patients: EntrepreneurPulse.com.au  Fact Sheet for Healthcare  Providers: IncredibleEmployment.be  This test is not yet approved or cleared by the [place] FDA and has been authorized for detection and/or diagnosis of SARS-CoV-2 by FDA under an Emergency Use Authorization (EUA). This EUA will remain in effect (meaning this test can be used) for the duration of the COVID-19 declaration under Section 564(b)(1) of the Act, 21 U.S.C. section 360bbb-3(b)(1), unless the authorization is terminated or revoked.  Performed at [facility], [address] [place], [place] [address]     No current facility-administered medications for this encounter.   Current Outpatient Medications  Medication Sig Dispense Refill   amoxicillin-clavulanate (AUGMENTIN) 875-125 MG tablet Take 1 tablet by mouth every 12 (twelve) hours. (Patient not taking: Reported on 12/17/2020) 3 tablet 0   aspirin EC 81 MG tablet Take 81 mg by mouth daily. Swallow whole.     atorvastatin (LIPITOR) 80 MG tablet Take 80 mg by mouth every evening.     citalopram (CELEXA) 10 MG tablet Take 1 tablet (10 mg total) by mouth daily. 30 tablet 1   diclofenac Sodium (VOLTAREN) 1 % GEL Apply 4 g topically 4 (four) times daily.     famotidine (PEPCID) 20 MG tablet Take 20 mg by mouth 2 (two) times daily.     gabapentin (NEURONTIN) 400 MG capsule Take 1 capsule (400 mg total) by mouth 3 (three) times daily. 90 capsule 1   lisinopril (ZESTRIL) 20 MG tablet Take 1 tablet (20 mg total) by mouth daily. 30 tablet 1   QUEtiapine (SEROQUEL) 300 MG tablet Take 1 tablet (300 mg total) by mouth at bedtime. 30 tablet 1    Musculoskeletal: Strength & Muscle Tone: within normal limits Gait & Station: normal Patient leans: N/A  Psychiatric Specialty Exam:  Presentation  General Appearance: Appropriate for Environment  Eye Contact:Good  Speech:Clear and Coherent  Speech Volume:Normal  Handedness:Right   Mood and Affect  Mood:Euthymic  Affect:Congruent   Thought  Process  Thought Processes:Coherent  Descriptions of Associations:Intact  Orientation:Full (Time, Place and Person)  Thought Content:WDL  History of Schizophrenia/Schizoaffective disorder:No  Duration of Psychotic Symptoms:Less than six months  Hallucinations:Hallucinations: None  Ideas of Reference:None  Suicidal Thoughts:Suicidal Thoughts: No  Homicidal Thoughts:Homicidal Thoughts: No   Sensorium  Memory:Immediate Good  Judgment:Fair  Insight:Fair   Executive Functions  Concentration:Fair  Attention Span:Fair  [place]   Psychomotor Activity  Psychomotor Activity:Psychomotor Activity: Normal   Assets  Assets:Resilience   Sleep  Sleep:Sleep: Fair   Physical Exam: Physical Exam Vitals and nursing note reviewed.  HENT:     Head: Normocephalic.     Nose: No congestion or rhinorrhea.  Eyes:     General:        Right eye: No discharge.        Left eye: No discharge.  Cardiovascular:     Rate and Rhythm: Normal rate.  Pulmonary:     Effort: Pulmonary effort is normal.  Musculoskeletal:        General: Normal range of motion.     Cervical back: Normal range of motion.  Skin:    General: Skin is dry.  Neurological:     Mental Status: He is alert and oriented to person, place, and time.  Psychiatric:        Attention and Perception: Attention normal.        Mood and Affect:  Affect is blunt.        Speech: Speech normal.        Behavior: Behavior normal.        Thought Content: Thought content normal.        Cognition and Memory: Cognition normal.        Judgment: Judgment is impulsive.   Review of Systems  Psychiatric/Behavioral:  Positive for depression (stable) and substance abuse. Negative for hallucinations, memory loss and suicidal ideas. The patient is not nervous/anxious and does not have insomnia.   All other systems reviewed and are negative. Blood pressure (!) 155/95, pulse 61,  temperature 98.1 F (36.7 C), temperature source Oral, resp. rate 17, height 6\' 1"  (1.854 m), weight 99.8 kg, SpO2 96 %. Body mass index is 29.03 kg/m.  Treatment Plan Summary: Plan referral to substance abuse programs  Disposition: No evidence of imminent risk to self or others at present.   Patient does not meet criteria for psychiatric inpatient admission. Supportive therapy provided about ongoing stressors. Referral to substance use programs  Sherlon Handing, NP 04/26/2021 12:46 PM

## 2021-04-26 NOTE — ED Notes (Signed)
Pt states he does not want an iv or the ct scan.
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
## 2021-04-26 NOTE — ED Notes (Signed)
Report called to kim rn bhu nurse 

## 2021-04-26 NOTE — BH Assessment (Signed)
Referral checks:    [facility] [phone] or [phone]) Per Mia, facility is currently not accepting out of system referrals.    [facility] [phone]) Levada Dy agreed to follow up with a return phone call.    [facility] [phone]) No answer; left message.    [facility] ([phone]) There are currently no male beds available at this time.  . [facility] [phone]) (Walk-in only)    . [facility] [phone]) Per Glenna Durand, pt was deflected due to insurance.    [facility] [phone]) No intake staff available until 8:30 AM.    . [facility] [phone]) No answer   . [facility] [phone]) Ray agreed to follow up with a return phone call.   [facility] [phone]- NOT accepting patients for the next 14 days.
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
## 2021-04-26 NOTE — ED Notes (Signed)
Hospital meal provided, pt tolerated w/o complaints.  Waste discarded appropriately.  

## 2021-04-26 NOTE — ED Notes (Signed)
Patient provided snack at appropriate snack time.  Pt consumed 100% of snack provided, tolerated well w/o complaints   Trash disposted of appropriately by patient.  

## 2021-04-27 NOTE — ED Provider Notes (Signed)
Patient cleared by psychiatry and social work. He remains HDS. Outpatient follow-up has been arranged.    Duffy Bruce, MD 04/27/21 [phone]

## 2021-04-27 NOTE — ED Notes (Signed)
On initial round after report Pt is resting quietly in room without any s/s of distress.  Will continue to monitor throughout shift as ordered for any changes in behaviors and for continued safety.

## 2021-04-27 NOTE — ED Provider Notes (Signed)
Emergency Medicine Observation Re-evaluation Note  Samuel Molina is a 61 y.o. male, seen on rounds today.  Pt initially presented to the ED for complaints of Depression and Suicide Attempt Currently, the patient is resting.  Physical Exam  BP (!) 153/93 (BP Location: Right Arm)    Pulse 60    Temp 98.3 F (36.8 C) (Oral)    Resp 18    Ht 6\' 1"  (1.854 m)    Wt 99.8 kg    SpO2 98%    BMI 29.03 kg/m  Physical Exam General: NAD Psych: Calm  ED Course / MDM  EKG:   I have reviewed the labs performed to date as well as medications administered while in observation.  Recent changes in the last 24 hours include none.  Plan  Current plan is for Psych dispo.  Samuel Molina is not under involuntary commitment.     Duffy Bruce, MD 04/27/21 [phone]

## 2021-04-27 NOTE — ED Notes (Signed)
Pt requested to use phone, phone was provided. Staff reminded pt that calls are limited to 10 min.s as it is a public phone shared with peers.  Pt became agitated cursing at staff.  Pt then ask for a drink, ice water was provided.  Pt then began loudly/aggressivly speaking at staff and stated that he was "Owed a ginger ale from yesterday" and that he does not drink water.

## 2021-04-27 NOTE — BH Assessment (Signed)
Writer spoke with Herbie Baltimore at [facility]. They do have male beds at this time. Writer faxed patient referral to [phone].

## 2021-04-28 ENCOUNTER — Other Ambulatory Visit: Payer: Self-pay

## 2021-04-28 ENCOUNTER — Emergency Department: Payer: Self-pay

## 2021-04-28 ENCOUNTER — Emergency Department
Admission: EM | Admit: 2021-04-28 | Discharge: 2021-04-28 | Disposition: A | Discharge status: Home / Self Care | Payer: Self-pay | Attending: Emergency Medicine | Admitting: Emergency Medicine

## 2021-04-28 DIAGNOSIS — M542 Cervicalgia: Secondary | ICD-10-CM | POA: Insufficient documentation

## 2021-04-28 DIAGNOSIS — Y900 Blood alcohol level of less than 20 mg/100 ml: Secondary | ICD-10-CM | POA: Insufficient documentation

## 2021-04-28 DIAGNOSIS — I67848 Other cerebrovascular vasospasm and vasoconstriction: Secondary | ICD-10-CM | POA: Insufficient documentation

## 2021-04-28 DIAGNOSIS — G43109 Migraine with aura, not intractable, without status migrainosus: Secondary | ICD-10-CM

## 2021-04-28 DIAGNOSIS — R531 Weakness: Secondary | ICD-10-CM | POA: Insufficient documentation

## 2021-04-28 DIAGNOSIS — G43909 Migraine, unspecified, not intractable, without status migrainosus: Secondary | ICD-10-CM | POA: Insufficient documentation

## 2021-04-28 LAB — URINE DRUG SCREEN, QUALITATIVE (ARMC ONLY)
Amphetamines, Ur Screen: NOT DETECTED
Barbiturates, Ur Screen: NOT DETECTED
Benzodiazepine, Ur Scrn: NOT DETECTED
Cannabinoid 50 Ng, Ur ~~LOC~~: NOT DETECTED
Cocaine Metabolite,Ur ~~LOC~~: POSITIVE — AB
MDMA (Ecstasy)Ur Screen: NOT DETECTED
Methadone Scn, Ur: NOT DETECTED
Opiate, Ur Screen: NOT DETECTED
Phencyclidine (PCP) Ur S: NOT DETECTED
Tricyclic, Ur Screen: NOT DETECTED

## 2021-04-28 LAB — CBC
HCT: 44 % (ref 39.0–52.0)
Hemoglobin: 14.4 g/dL (ref 13.0–17.0)
MCH: 26.7 pg (ref 26.0–34.0)
MCHC: 32.7 g/dL (ref 30.0–36.0)
MCV: 81.5 fL (ref 80.0–100.0)
Platelets: 334 10*3/uL (ref 150–400)
RBC: 5.4 MIL/uL (ref 4.22–5.81)
RDW: 15 % (ref 11.5–15.5)
WBC: 10 10*3/uL (ref 4.0–10.5)
nRBC: 0 % (ref 0.0–0.2)

## 2021-04-28 LAB — COMPREHENSIVE METABOLIC PANEL
ALT: 19 U/L (ref 0–44)
AST: 25 U/L (ref 15–41)
Albumin: 4 g/dL (ref 3.5–5.0)
Alkaline Phosphatase: 102 U/L (ref 38–126)
Anion gap: 9 (ref 5–15)
BUN: 14 mg/dL (ref 6–20)
CO2: 27 mmol/L (ref 22–32)
Calcium: 9 mg/dL (ref 8.9–10.3)
Chloride: 104 mmol/L (ref 98–111)
Creatinine, Ser: 0.89 mg/dL (ref 0.61–1.24)
GFR, Estimated: >60 mL/min (ref >60)
Glucose, Bld: 91 mg/dL (ref 70–99)
Potassium: 4.1 mmol/L (ref 3.5–5.1)
Sodium: 140 mmol/L (ref 135–145)
Total Bilirubin: 0.9 mg/dL (ref 0.3–1.2)
Total Protein: 8 g/dL (ref 6.5–8.1)

## 2021-04-28 LAB — DIFFERENTIAL
Abs Immature Granulocytes: 0.02 10*3/uL (ref 0.00–0.07)
Basophils Absolute: 0 10*3/uL (ref 0.0–0.1)
Basophils Relative: 0 %
Eosinophils Absolute: 0 10*3/uL (ref 0.0–0.5)
Eosinophils Relative: 0 %
Immature Granulocytes: 0 %
Lymphocytes Relative: 28 %
Lymphs Abs: 2.8 10*3/uL (ref 0.7–4.0)
Monocytes Absolute: 0.9 10*3/uL (ref 0.1–1.0)
Monocytes Relative: 9 %
Neutro Abs: 6.2 10*3/uL (ref 1.7–7.7)
Neutrophils Relative %: 63 %

## 2021-04-28 LAB — CBG MONITORING, ED: Glucose-Capillary: 97 mg/dL (ref 70–99)

## 2021-04-28 LAB — PROTIME-INR
INR: 0.9 (ref 0.8–1.2)
Prothrombin Time: 12.2 seconds (ref 11.4–15.2)

## 2021-04-28 LAB — ETHANOL: Alcohol, Ethyl (B): <10 mg/dL (ref <10)

## 2021-04-28 LAB — APTT: aPTT: 29 seconds (ref 24–36)

## 2021-04-28 MED ORDER — KETOROLAC TROMETHAMINE 30 MG/ML IJ SOLN
15.0000 mg | Freq: Once | INTRAMUSCULAR | Status: AC
  Administered 2021-04-28: 15 mg via INTRAVENOUS
  Filled 2021-04-28: qty 1

## 2021-04-28 MED ORDER — IOHEXOL 350 MG/ML SOLN
100.0000 mL | Freq: Once | INTRAVENOUS | Status: AC | PRN
Start: 2021-04-28 — End: 2021-04-28
  Administered 2021-04-28: 100 mL via INTRAVENOUS

## 2021-04-28 MED ORDER — ASPIRIN 81 MG PO CHEW
324.0000 mg | CHEWABLE_TABLET | Freq: Once | ORAL | Status: AC
  Administered 2021-04-28: 324 mg via ORAL
  Filled 2021-04-28: qty 4

## 2021-04-28 MED ORDER — SODIUM CHLORIDE 0.9% FLUSH
3.0000 mL | Freq: Once | INTRAVENOUS | Status: AC
  Administered 2021-04-28: 3 mL via INTRAVENOUS

## 2021-04-28 MED ORDER — METOCLOPRAMIDE HCL 5 MG/ML IJ SOLN
10.0000 mg | Freq: Once | INTRAMUSCULAR | Status: AC
  Administered 2021-04-28: 10 mg via INTRAVENOUS
  Filled 2021-04-28: qty 2

## 2021-04-28 NOTE — ED Notes (Signed)
Pt transported to CT by EMS with this RN - CBG 97.
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
## 2021-04-28 NOTE — ED Notes (Signed)
Unable to obtain VS at this time due to the pt being in CT.

## 2021-04-28 NOTE — ED Notes (Signed)
Pt provided an icepack and extremity elevated.

## 2021-04-28 NOTE — ED Provider Notes (Signed)
[facility] Provider Note    Event Date/Time   First MD Initiated Contact with Patient 04/28/21 [phone]     (approximate)   History   Code Stroke   HPI  Samuel Molina is a 61 y.o. male with a history of elevated BMI, cocaine abuse who presents for evaluation of left-sided weakness and facial droop.  Patient has had several episodes since December 2021 of the same exact presentation with several work-ups for stroke which were all unremarkable.  This time around patient reports that he was driving home at 2 AM from his brother's house when he started having a left-sided headache, blurry vision, left-sided facial droop, and weakness of the left upper and lower extremity.  He pulled over and called 911.  Code stroke was activated from the field.  Patient endorses continued use of cocaine with the last episode being yesterday.  No chest pain or shortness of breath.  He is complaining of a sharp throbbing severe left-sided headache.     Past Medical History:  Diagnosis Date   Anxiety    Depression    GERD (gastroesophageal reflux disease)    Obesity    Stroke [facility])    Substance abuse ([place])     Past Surgical History:  Procedure Laterality Date   ESOPHAGOGASTRODUODENOSCOPY (EGD) WITH PROPOFOL N/A 09/20/2017   Procedure: ESOPHAGOGASTRODUODENOSCOPY (EGD) WITH PROPOFOL;  Surgeon: Lin Landsman, MD;  Location: [facility] ENDOSCOPY;  Service: Gastroenterology;  Laterality: N/A;   KNEE SURGERY Right      Physical Exam   Triage Vital Signs: ED Triage Vitals  Enc Vitals Group     BP      Pulse      Resp      Temp      Temp src      SpO2      Weight      Height      Head Circumference      Peak Flow      Pain Score      Pain Loc      Pain Edu?      Excl. in [place]?     Most recent vital signs: Vitals:   04/28/21 0600 04/28/21 0628  BP: (!) 150/73 (!) 146/77  Pulse: (!) 52 62  Resp: 19 18  Temp:    SpO2: 100% 99%     Constitutional: Alert and  oriented. Well appearing and in no apparent distress. HEENT:      Head: Normocephalic and atraumatic.         Eyes: Conjunctivae are normal. Sclera is non-icteric.       Mouth/Throat: Mucous membranes are moist.       Neck: Supple with no signs of meningismus. Cardiovascular: Regular rate and rhythm. No murmurs, gallops, or rubs. 2+ symmetrical distal pulses are present in all extremities.  Respiratory: Normal respiratory effort. Lungs are clear to auscultation bilaterally.  Gastrointestinal: Soft, non tender, and non distended with positive bowel sounds. No rebound or guarding. Genitourinary: No CVA tenderness. Musculoskeletal:  No edema, cyanosis, or erythema of extremities. Neurologic: Normal speech and language. L labial fold droop, although patient has difficulty elevating his left arm and leg from the bed, he does not have flaccid paralysis and actually fights me as I try to bend the joints on the L extremities. EOMI, PERRL Skin: Skin is warm, dry and intact. No rash noted. Psychiatric: Mood and affect are normal. Speech and behavior are normal.  ED Results /  Procedures / Treatments   Labs (all labs ordered are listed, but only abnormal results are displayed) Labs Reviewed  URINE DRUG SCREEN, QUALITATIVE ([place]) - Abnormal; Notable for the following components:      Result Value   Cocaine Metabolite,Ur [place] POSITIVE (*)    All other components within normal limits  PROTIME-INR  APTT  CBC  DIFFERENTIAL  COMPREHENSIVE METABOLIC PANEL  ETHANOL  CBG MONITORING, ED     EKG  ED ECG REPORT I, Rudene Re, the attending physician, personally viewed and interpreted this ECG.  Sinus rhythm with rate of 54, no ST elevations or depressions.  No change from prior   RADIOLOGY I have personally reviewed the images performed during this visit and I agree with the Radiologist's read.   Interpretation by Radiologist:  MR BRAIN WO CONTRAST  Result Date: 04/28/2021 CLINICAL  DATA:  61 year old male with left side facial droop and weakness. Headaches. Code stroke presentation. EXAM: MRI HEAD WITHOUT CONTRAST TECHNIQUE: Multiplanar, multiecho pulse sequences of the brain and surrounding structures were obtained without intravenous contrast. COMPARISON:  Head CT 0343 hours today.  Recent brain MRI 04/25/2021. FINDINGS: Brain: No restricted diffusion to suggest acute infarction. No midline shift, mass effect, evidence of mass lesion, ventriculomegaly, extra-axial collection or acute intracranial hemorrhage. Cervicomedullary junction and pituitary are within normal limits. Normal cerebral volume. Small left quadrigeminal plate lipoma, congenital normal variant. [place] Cables and white matter signal remains within normal limits. No encephalomalacia or chronic cerebral blood products identified. Vascular: Major intracranial vascular flow voids are stable. Skull and upper cervical spine: Negative. Visualized bone marrow signal is within normal limits. Sinuses/Orbits: Possible chronic left orbital floor fracture, but otherwise orbits appear stable and negative. Mild paranasal sinus mucosal thickening has regressed. Other: Mastoids remain well aerated. Visible internal auditory structures appear normal. Small right nasopharyngeal retention cysts are stable from a 2021 MRI and appear benign. IMPRESSION: No acute intracranial abnormality. Stable and normal noncontrast MRI appearance of the brain. Electronically Signed   By: Genevie Ann M.D.   On: 04/28/2021 05:30   CT CEREBRAL PERFUSION W CONTRAST  Result Date: 04/28/2021 CLINICAL DATA:  61 year old male with left side facial droop and weakness. Headaches. Code stroke presentation. EXAM: CT ANGIOGRAPHY HEAD AND NECK CT PERFUSION BRAIN TECHNIQUE: Multidetector CT imaging of the head and neck was performed using the standard protocol during bolus administration of intravenous contrast. Multiplanar CT image reconstructions and MIPs were obtained to evaluate the  vascular anatomy. Carotid stenosis measurements (when applicable) are obtained utilizing NASCET criteria, using the distal internal carotid diameter as the denominator. Multiphase CT imaging of the brain was performed following IV bolus contrast injection. Subsequent parametric perfusion maps were calculated using RAPID software. CONTRAST:  148mL OMNIPAQUE IOHEXOL 350 MG/ML SOLN COMPARISON:  Brain MRI 0450 hours today. CTA head and neck 06/05/2020. FINDINGS: CT Brain Perfusion Findings: Unfortunately CT Perfusion raw images could not be processed, as the circle-of-Willis at the base of the brain was not included in the image acquisition. CTA NECK Skeleton: Scattered dental caries. Chronic left orbital floor and anterior maxilla fracture. Lower cervical disc and endplate degeneration appears stable. No acute osseous abnormality identified. Upper chest: Negative. Other neck: Glottis is closed today. Oropharynx also effaced, but neck soft tissues appear to remain within normal limits. Aortic arch: 3 vessel arch configuration with minimal arch atherosclerosis. Right carotid system: Negative. Left carotid system: Negative. Vertebral arteries: Stable and negative aside from mild tortuosity. CTA HEAD Posterior circulation: Stable codominant distal vertebral arteries  with patent vertebrobasilar junction. Normal PICA origins. Distal right vertebral artery diminutive beyond PICA. But no distal vertebral or basilar artery stenosis. Stable basilar tortuosity. SCA and PCA origins remain within normal limits. Bilateral PCA branches are stable and within normal limits. Anterior circulation: Patent ICA siphons with no plaque or stenosis. Patent carotid termini. Normal MCA and ACA origins. Normal anterior communicating artery. Stable ACA branches, within normal limits. The right ACA A2 is dominant. Left MCA M1 segment and bifurcation are patent without stenosis. Right MCA M1 segment and bifurcation are patent without stenosis.  Bilateral MCA branches appear stable and within normal limits. Venous sinuses: Patent. IJ venous contrast reflux in the lower neck. Anatomic variants: Dominant right ACA A2 segment. Review of the MIP images confirms the above findings IMPRESSION: 1. CTA Head and Neck is stable since last year and negative. No significant atherosclerosis or stenosis. 2. Nondiagnostic CT Perfusion. Electronically Signed   By: Genevie Ann M.D.   On: 04/28/2021 05:57   CT HEAD CODE STROKE WO CONTRAST  Result Date: 04/28/2021 CLINICAL DATA:  Code stroke. EXAM: CT HEAD WITHOUT CONTRAST TECHNIQUE: Contiguous axial images were obtained from the base of the skull through the vertex without intravenous contrast. COMPARISON:  None. FINDINGS: Brain: Cerebral volume within normal limits. No acute intracranial hemorrhage. No acute large vessel territory infarct. Small lipoma at the left quadrigeminal plate cistern again noted. No other mass lesion, mass effect or midline shift. No hydrocephalus or extra-axial fluid collection. Vascular: No hyperdense vessel. Skull: Scalp soft tissues demonstrate no acute finding. Calvarium intact. Sinuses/Orbits: Globes and orbital soft tissues demonstrate no acute finding. Probable remote left orbital floor fracture noted. Scattered mucosal thickening noted within the ethmoidal air cells. Paranasal sinuses and mastoid air cells are otherwise clear. Other: None. ASPECTS [facility] Stroke Program Early CT Score) - Ganglionic level infarction (caudate, lentiform nuclei, internal capsule, insula, M1-M3 cortex): 7 - Supraganglionic infarction (M4-M6 cortex): 3 Total score (0-10 with 10 being normal): 10 IMPRESSION: 1. Negative head CT.  No acute intracranial abnormality. 2. ASPECTS is 10. Critical Value/emergent results were called by telephone at the time of interpretation on 04/28/2021 at 3:56 am to provider [facility] , who verbally acknowledged these results. Electronically Signed   By: Jeannine Boga  M.D.   On: 04/28/2021 03:59   CT ANGIO HEAD NECK W WO CM (CODE STROKE)  Result Date: 04/28/2021 CLINICAL DATA:  61 year old male with left side facial droop and weakness. Headaches. Code stroke presentation. EXAM: CT ANGIOGRAPHY HEAD AND NECK CT PERFUSION BRAIN TECHNIQUE: Multidetector CT imaging of the head and neck was performed using the standard protocol during bolus administration of intravenous contrast. Multiplanar CT image reconstructions and MIPs were obtained to evaluate the vascular anatomy. Carotid stenosis measurements (when applicable) are obtained utilizing NASCET criteria, using the distal internal carotid diameter as the denominator. Multiphase CT imaging of the brain was performed following IV bolus contrast injection. Subsequent parametric perfusion maps were calculated using RAPID software. CONTRAST:  167mL OMNIPAQUE IOHEXOL 350 MG/ML SOLN COMPARISON:  Brain MRI 0450 hours today. CTA head and neck 06/05/2020. FINDINGS: CT Brain Perfusion Findings: Unfortunately CT Perfusion raw images could not be processed, as the circle-of-Willis at the base of the brain was not included in the image acquisition. CTA NECK Skeleton: Scattered dental caries. Chronic left orbital floor and anterior maxilla fracture. Lower cervical disc and endplate degeneration appears stable. No acute osseous abnormality identified. Upper chest: Negative. Other neck: Glottis is closed today. Oropharynx also effaced, but  neck soft tissues appear to remain within normal limits. Aortic arch: 3 vessel arch configuration with minimal arch atherosclerosis. Right carotid system: Negative. Left carotid system: Negative. Vertebral arteries: Stable and negative aside from mild tortuosity. CTA HEAD Posterior circulation: Stable codominant distal vertebral arteries with patent vertebrobasilar junction. Normal PICA origins. Distal right vertebral artery diminutive beyond PICA. But no distal vertebral or basilar artery stenosis. Stable  basilar tortuosity. SCA and PCA origins remain within normal limits. Bilateral PCA branches are stable and within normal limits. Anterior circulation: Patent ICA siphons with no plaque or stenosis. Patent carotid termini. Normal MCA and ACA origins. Normal anterior communicating artery. Stable ACA branches, within normal limits. The right ACA A2 is dominant. Left MCA M1 segment and bifurcation are patent without stenosis. Right MCA M1 segment and bifurcation are patent without stenosis. Bilateral MCA branches appear stable and within normal limits. Venous sinuses: Patent. IJ venous contrast reflux in the lower neck. Anatomic variants: Dominant right ACA A2 segment. Review of the MIP images confirms the above findings IMPRESSION: 1. CTA Head and Neck is stable since last year and negative. No significant atherosclerosis or stenosis. 2. Nondiagnostic CT Perfusion. Electronically Signed   By: Genevie Ann M.D.   On: 04/28/2021 05:57      PROCEDURES:  Critical Care performed: No  Procedures    IMPRESSION / MDM / ASSESSMENT AND PLAN / ED COURSE  I reviewed the triage vital signs and the nursing notes.  61 y.o. male with a history of elevated BMI, cocaine abuse who presents for evaluation of left-sided weakness and facial droop.  Last known normal was 2 AM.  On arrival to the emergency room patient is noted to have a left labial asymmetry, he shows weakness when trying to elevate his left upper and lower extremity however when I try to manipulate his extremities and bend his joints patient has no flaccid paralysis and is actually really strong and preventing me from doing that.  A code stroke was called from the field.  Ddx: Stroke versus cocaine induced cerebral vasospasm versus complex migraine headache   Plan: CT of the head, CBC, chemistry panel, UDS, alcohol level, neurology consult.  Patient placed on telemetry for close monitoring of cardiorespiratory status.   MEDICATIONS GIVEN IN  ED: Medications  sodium chloride flush (NS) 0.9 % injection 3 mL (3 mLs Intravenous Given 04/28/21 0419)  aspirin chewable tablet 324 mg (324 mg Oral Given 04/28/21 0429)  iohexol (OMNIPAQUE) 350 MG/ML injection 100 mL (100 mLs Intravenous Contrast Given 04/28/21 0519)  ketorolac (TORADOL) 30 MG/ML injection 15 mg (15 mg Intravenous Given 04/28/21 0619)  metoCLOPramide (REGLAN) injection 10 mg (10 mg Intravenous Given 04/28/21 4401)     ED COURSE: Patient was evaluated by myself as a code stroke upon arrival to the emergency room.  I consulted teleneurology and after discussion they evaluated patient in the emergency room.  He underwent a CT of the head which was negative for any acute pathology.  He was given an aspirin and sent for an MRI of the brain which was negative for stroke.  Neurologist also recommended CT angio of the head and neck which were all negative.  All imaging was reviewed by me with official confirm read by radiology.  His UDS was positive for cocaine.  His EKG did not show any signs of dysrhythmias.  His chemistry panel was normal so was his CBC.  Patient was treated with IV Toradol and Reglan for possible complicated migraine with full  resolution of his symptoms.  We discussed admission to the hospitalist service but since symptoms fully resolved and patient had a negative work-up he opted to be discharged home.  He does see a neurologist and I recommended close follow-up with his neurologist for further evaluation.  I did review his EMR with his prior visits to [facility] for which he has been seen several times since December 2021.  Has had extensive evaluation with several MRIs and had CTs, echocardiograms and carotid artery imaging all with no acute abnormalities.  He always presents with headache and nausea in the setting of cocaine.  Therefore at this point I believe his presentation is due to either cerebral vasoconstriction from cocaine or complicated migraine headaches.  I did discuss the  dangers associated with cocaine use and recommended stopping it.  I discussed my standard return precautions for any recurrent signs of stroke.   Consults: Neurology   EMR reviewed All charts for admissions to [facility] dating April 05, 2020, June 26, 2020, and at [facility] ED visits including all imaging were reviewed by me          FINAL CLINICAL IMPRESSION(S) / ED DIAGNOSES   Final diagnoses:  Complicated migraine     Rx / DC Orders   ED Discharge Orders     None        Note:  This document was prepared using Dragon voice recognition software and may include unintentional dictation errors.    Alfred Levins, [place], MD 04/28/21 [phone]

## 2021-04-28 NOTE — Consult Note (Addendum)
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 He has had similar presentations, on and off since dec 2021.  Hx from pt, Ed provider, records. He had L face droop 2-3 days ago, and MRI brain negative at that time. Pt reports he was doing well until 2 am, with acute L weakness. He takes ASA at home. He does admit to cocaine use yesterday . Pt reports he had blood in stool and in toilet bowl, red, just within last week or 2.  NIH 6: L face, arm, leg weakness, numbness, dysarthria.  CT head per rads: no acute changes.  Diff Dx: CVA, other, migraine, seizure, discussed abstinence from cocaine with patient.  Not iV thrombolytic candidate as had hx of recent brain bleed, and this recent GI bleed.  Rec:  - CVA work up  - CTA head, neck r/o LVO  - MRI brain  - ASA 81 mg  - EEG  - cocaine abstinence d.w patient  - Neuro fup  d/w pt, RN  dw Dr Alfred Levins in ER the above recs.  Metrics: Last Known Well: 04/28/2020 02:00:00 [facility] Notification Time: 04/28/2021 04:03:54 Arrival Time: 04/28/2021 03:35:21 Stamp Time: 04/28/2021 04:03:54 Initial Response Time: 04/28/2021 04:10:50 Symptoms: L weakness. NIHSS Start Assessment Time: 04/28/2021 04:19:14 Patient is not a candidate for Thrombolytic. Thrombolytic Medical Decision: 04/28/2021 04:26:11 Patient was not deemed candidate for Thrombolytic because of following reasons: Current or Previous ICH. GI Bleeding (Within 21 Days).  CT head showed no acute hemorrhage or acute core  infarct.  ED Physician notified of diagnostic impression and management plan on 04/28/2021 04:45:11  Advanced Imaging: CTA Head and Neck Ordered:  CTP Ordered:   Our recommendations are outlined below.  Recommendations:        Stroke/Telemetry Floor       Neuro Checks       Bedside Swallow Eval       DVT Prophylaxis       IV Fluids, Normal Saline       Head of Bed 30 Degrees       Euglycemia and Avoid Hyperthermia (PRN Acetaminophen)       Aspirin per rectum       Antihypertensives PRN if Blood pressure is greater than 220/120 or there is a concern for End organ damage/contraindications for permissive HTN. If blood pressure is greater than 220/120 give labetalol PO or IV or Vasotec IV with a goal of 15% reduction in BP during the first 24 hours.  Routine Consultation with [facility] for Follow up Care  Sign Out:       Discussed with Emergency Department Provider    ------------------------------------------------------------------------------  History of Present Illness: Patient is a 61 year old Male.  Patient was brought by EMS for symptoms of L weakness. 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 He has had  similar presentations, on and off since dec 2021. Hx from pt, Ed provider, records. He had L face droop 2-3 days ago, and MRI brain negative at that time. Pt reports he was doing well until 2 am, with acute L weakness. He takes ASA at home. He does admit to cocaine use yesterday .   Past Medical History:      Hypertension      Stroke  Medications:  No Anticoagulant use  Antiplatelet use: Yes asa Reviewed EMR for current medications  Allergies:  Reviewed  Social History: Drug Use: Yes  Family History:  There Is Family History Of:rev There is no family history of premature cerebrovascular disease pertinent to this  consultation  ROS : 14 Points Review of Systems was performed and was negative except mentioned in HPI.  Past Surgical History: There Is No Surgical History Contributory To Todays Visit    Examination: BP(180/93), Pulse(88), Blood Glucose(97) 1A: Level of Consciousness - Alert; keenly responsive + 0 1B: Ask Month and Age - Both Questions Right + 0 1C: Blink Eyes & Squeeze Hands - Performs Both Tasks + 0 2: Test Horizontal Extraocular Movements - Normal + 0 3: Test Visual Fields - No Visual Loss + 0 4: Test Facial Palsy (Use Grimace if Obtunded) - Partial paralysis (lower face) + 2 5A: Test Left Arm Motor Drift - Drift, but doesn't hit bed + 1 5B: Test Right Arm Motor Drift - No Drift for 10 Seconds + 0 6A: Test Left Leg Motor Drift - Drift, but doesn't hit bed + 1 6B: Test Right Leg Motor Drift - No Drift for 5 Seconds + 0 7: Test Limb Ataxia (FNF/Heel-Shin) - No Ataxia + 0 8: Test Sensation - Mild-Moderate Loss: Less Sharp/More Dull + 1 9: Test Language/Aphasia - Normal; No aphasia + 0 10: Test Dysarthria - Mild-Moderate Dysarthria: Slurring but can be understood + 1 11: Test Extinction/Inattention - No abnormality + 0  NIHSS Score: 6   Pre-Morbid Modified Rankin Scale: 1 Points = No significant disability despite symptoms; able to carry out all usual duties and activities   Patient/Family was informed the Neurology Consult would occur via TeleHealth consult by way of interactive audio and video telecommunications and consented to receiving care in this manner.   Patient is being evaluated for possible acute neurologic impairment and high probability of imminent or life-threatening deterioration. I spent total of 45 minutes providing care to this patient, including time for face to face visit via telemedicine, review of medical records, imaging studies and discussion of findings with providers, the patient and/or family.   Dr Lloyd Huger   [facility] [phone]   Case [id number] Advanced Imaging:  CTA Head and Neck Completed.  CTP Completed.  LVO:No

## 2021-04-28 NOTE — ED Notes (Addendum)
Pt to MRI via wheelchair. Pt able to stand and ambulate to wheelchair without assistance.

## 2021-04-28 NOTE — ED Notes (Signed)
Pt endorses cocaine use - stating that he used cocaine yesterday evening.

## 2021-04-28 NOTE — ED Notes (Signed)
Pt provided a sandwich box and gingerale. °

## 2021-10-01 ENCOUNTER — Other Ambulatory Visit: Payer: Self-pay

## 2021-10-01 ENCOUNTER — Emergency Department
Admission: EM | Admit: 2021-10-01 | Discharge: 2021-10-01 | Disposition: A | Discharge status: Home / Self Care | Payer: Self-pay | Attending: Emergency Medicine | Admitting: Emergency Medicine

## 2021-10-01 ENCOUNTER — Encounter: Payer: Self-pay | Admitting: Emergency Medicine

## 2021-10-01 ENCOUNTER — Emergency Department: Payer: Self-pay

## 2021-10-01 DIAGNOSIS — M25561 Pain in right knee: Secondary | ICD-10-CM | POA: Diagnosis not present

## 2021-10-01 DIAGNOSIS — M25461 Effusion, right knee: Secondary | ICD-10-CM | POA: Insufficient documentation

## 2021-10-01 MED ORDER — NAPROXEN 500 MG PO TABS: 500.0000 mg | ORAL_TABLET | Freq: Two times a day (BID) | ORAL | 0 refills | Status: DC

## 2021-10-01 MED ORDER — KETOROLAC TROMETHAMINE 10 MG PO TABS
10.0000 mg | ORAL_TABLET | Freq: Once | ORAL | Status: AC
  Administered 2021-10-01: 10 mg via ORAL
  Filled 2021-10-01: qty 1

## 2021-10-01 MED ORDER — OXYCODONE-ACETAMINOPHEN 5-325 MG PO TABS
1.0000 | ORAL_TABLET | Freq: Once | ORAL | Status: DC
  Filled 2021-10-01: qty 1

## 2021-10-01 MED ORDER — GABAPENTIN 300 MG PO CAPS: 300.0000 mg | ORAL_CAPSULE | Freq: Three times a day (TID) | ORAL | 0 refills | Status: DC

## 2021-10-01 MED ORDER — OXYCODONE HCL 5 MG PO TABS
5.0000 mg | ORAL_TABLET | ORAL | Status: AC
  Administered 2021-10-01: 5 mg via ORAL
  Filled 2021-10-01: qty 1

## 2021-10-01 NOTE — ED Triage Notes (Signed)
Pt reports tire his meniscus a couple of months ago and is supposed to follow up for surgery but has not been able to get an appt yet and now his right knee is swelling.

## 2021-10-01 NOTE — ED Provider Notes (Signed)
[facility] Provider Note    Event Date/Time   First MD Initiated Contact with Patient 10/01/21 1559     (approximate)   History   Chief Complaint: Knee Pain   HPI  Jedd Schulenburg is a 61 y.o. male with a history of meniscus tear status post arthroscopy who comes ED complaining of worsening right knee pain and swelling for the past 2 days, severe.  Nonradiating.  Worse with movement, no chest pain or shortness of breath, no fever.  Feels like his prior meniscus injury.  Denies any acute trauma at this time around that he can identify.     Physical Exam   Triage Vital Signs: ED Triage Vitals  Enc Vitals Group     BP 10/01/21 1437 (!) 144/75     Pulse Rate 10/01/21 1437 67     Resp 10/01/21 1437 20     Temp 10/01/21 1437 97.8 F (36.6 C)     Temp Source 10/01/21 1437 Oral     SpO2 10/01/21 1437 98 %     Weight 10/01/21 1434 218 lb 4.1 oz (99 kg)     Height 10/01/21 1434 '6\' 1"'$  (1.854 m)     Head Circumference --      Peak Flow --      Pain Score 10/01/21 1434 10     Pain Loc --      Pain Edu? --      Excl. in [place]? --     Most recent vital signs: Vitals:   10/01/21 1437  BP: (!) 144/75  Pulse: 67  Resp: 20  Temp: 97.8 F (36.6 C)  SpO2: 98%    General: Awake, no distress.  CV:  Good peripheral perfusion.  Normal distal pulses Resp:  Normal effort.  Abd:  No distention.  Other:  Right knee effusion.  No bony tenderness.  Intact range of motion.  No inflammatory changes.  No crepitus.  No wounds.   ED Results / Procedures / Treatments   Labs (all labs ordered are listed, but only abnormal results are displayed) Labs Reviewed - No data to display   EKG    RADIOLOGY MRI right knee pending   PROCEDURES:  Procedures   MEDICATIONS ORDERED IN ED: Medications  ketorolac (TORADOL) tablet 10 mg (10 mg Oral Given 10/01/21 1621)  oxyCODONE (Oxy IR/ROXICODONE) immediate release tablet 5 mg (5 mg Oral Given 10/01/21 1703)      IMPRESSION / MDM / ASSESSMENT AND PLAN / ED COURSE  I reviewed the triage vital signs and the nursing notes.                              Differential diagnosis includes, but is not limited to, meniscus injury, arthritis.  Doubt septic arthritis, fracture, ligament rupture  Patient's presentation is most consistent with acute illness / injury with system symptoms.  Patient presents with severe right knee pain and a joint effusion.  I do not think that there is infection or fracture.  Most likely this is a recurrent meniscus injury versus osteoarthritis.  Discussed outpatient follow-up with orthopedics with the patient, but he is very eager to pursue diagnostics now without waiting for specialist follow-up.  I will obtain an MRI of the knee.  We will treat pain with oxycodone and Toradol p.o.   Clinical Course as of 10/01/21 1922  Sat Oct 01, 2021  1915 Pt ambulates with a steady gait  when he's not aware of being observed, then shifts to a pronounced limp when speaking with me.  [PS]    Clinical Course User Index [PS] Carrie Mew, MD     FINAL CLINICAL IMPRESSION(S) / ED DIAGNOSES   Final diagnoses:  Acute pain of right knee     Rx / DC Orders   ED Discharge Orders     None        Note:  This document was prepared using Dragon voice recognition software and may include unintentional dictation errors.   Carrie Mew, MD 10/01/21 1924

## 2021-10-01 NOTE — ED Notes (Signed)
Patient was belligerent while this writer was giving discharge instructions. Jenise PA-C came to the room to speak with patient about his concerns. Patient refused to sign discharge and refused discharge vital signs. Patient was given Ginger ale at discharge. Patient ran into EVS trash bin after walking rapidly out of room c/o misdiagnosing his pain. Patient did not stop and walked out to the lobby. Patient states he has a ride coming, but refused to elaborate. Security was called at the beginning of discharge, but did not come before discharge was completed.

## 2021-10-01 NOTE — ED Notes (Signed)
Pt asked this RN for [place] ice upon his arrival to ED. Then asked Efraim RN for a second one, remote and warm blanket. Pt also asked Brandy EMT for another [place] ice and another warm blanket. Pt then stepped out of his room asking for more [place] ice, ginger ale and blanket from another staff. Pt then got upset when staff informed him that she will verify with MD if ok to give. Pt started cussing staff and was very demanding. Per PA, notify security when pt comes back to standby.

## 2021-10-01 NOTE — Discharge Instructions (Addendum)
Your knee MRI shows degenerative changes and a joint effusion, but no identifiable acute injuries.  Please follow-up with orthopedics for further evaluation.  Use ice, Ace wrap, leg elevation over the next few days to help decrease swelling.

## 2021-11-07 ENCOUNTER — Emergency Department: Payer: Medicaid Other

## 2021-11-07 ENCOUNTER — Emergency Department: Payer: Self-pay

## 2021-11-07 ENCOUNTER — Emergency Department
Admission: EM | Admit: 2021-11-07 | Discharge: 2021-11-07 | Disposition: A | Discharge status: Home / Self Care | Payer: 59 | Attending: Emergency Medicine | Admitting: Emergency Medicine

## 2021-11-07 ENCOUNTER — Other Ambulatory Visit: Payer: Self-pay

## 2021-11-07 DIAGNOSIS — F1410 Cocaine abuse, uncomplicated: Secondary | ICD-10-CM | POA: Diagnosis present

## 2021-11-07 DIAGNOSIS — L02416 Cutaneous abscess of left lower limb: Secondary | ICD-10-CM | POA: Insufficient documentation

## 2021-11-07 DIAGNOSIS — F332 Major depressive disorder, recurrent severe without psychotic features: Secondary | ICD-10-CM | POA: Diagnosis present

## 2021-11-07 DIAGNOSIS — R69 Illness, unspecified: Secondary | ICD-10-CM | POA: Diagnosis not present

## 2021-11-07 LAB — SALICYLATE LEVEL: Salicylate Lvl: <7 mg/dL — ABNORMAL LOW (ref 7.0–30.0)

## 2021-11-07 LAB — URINE DRUG SCREEN, QUALITATIVE (ARMC ONLY)
Amphetamines, Ur Screen: NOT DETECTED
Barbiturates, Ur Screen: NOT DETECTED
Benzodiazepine, Ur Scrn: NOT DETECTED
Cannabinoid 50 Ng, Ur ~~LOC~~: NOT DETECTED
Cocaine Metabolite,Ur ~~LOC~~: POSITIVE — AB
MDMA (Ecstasy)Ur Screen: NOT DETECTED
Methadone Scn, Ur: NOT DETECTED
Opiate, Ur Screen: NOT DETECTED
Phencyclidine (PCP) Ur S: NOT DETECTED
Tricyclic, Ur Screen: NOT DETECTED

## 2021-11-07 LAB — COMPREHENSIVE METABOLIC PANEL
ALT: 14 U/L (ref 0–44)
AST: 23 U/L (ref 15–41)
Albumin: 3.5 g/dL (ref 3.5–5.0)
Alkaline Phosphatase: 91 U/L (ref 38–126)
Anion gap: 6 (ref 5–15)
BUN: 9 mg/dL (ref 6–20)
CO2: 24 mmol/L (ref 22–32)
Calcium: 9.3 mg/dL (ref 8.9–10.3)
Chloride: 111 mmol/L (ref 98–111)
Creatinine, Ser: 0.99 mg/dL (ref 0.61–1.24)
GFR, Estimated: >60 mL/min (ref >60)
Glucose, Bld: 152 mg/dL — ABNORMAL HIGH (ref 70–99)
Potassium: 3.5 mmol/L (ref 3.5–5.1)
Sodium: 141 mmol/L (ref 135–145)
Total Bilirubin: 0.2 mg/dL — ABNORMAL LOW (ref 0.3–1.2)
Total Protein: 7.3 g/dL (ref 6.5–8.1)

## 2021-11-07 LAB — CBC
HCT: 46.9 % (ref 39.0–52.0)
Hemoglobin: 14.3 g/dL (ref 13.0–17.0)
MCH: 25.6 pg — ABNORMAL LOW (ref 26.0–34.0)
MCHC: 30.5 g/dL (ref 30.0–36.0)
MCV: 83.9 fL (ref 80.0–100.0)
Platelets: 323 10*3/uL (ref 150–400)
RBC: 5.59 MIL/uL (ref 4.22–5.81)
RDW: 14.1 % (ref 11.5–15.5)
WBC: 8.2 10*3/uL (ref 4.0–10.5)
nRBC: 0 % (ref 0.0–0.2)

## 2021-11-07 LAB — ACETAMINOPHEN LEVEL: Acetaminophen (Tylenol), Serum: <10 ug/mL — ABNORMAL LOW (ref 10–30)

## 2021-11-07 LAB — ETHANOL: Alcohol, Ethyl (B): <10 mg/dL (ref <10)

## 2021-11-07 MED ORDER — DOXYCYCLINE MONOHYDRATE 100 MG PO TABS: 100.0000 mg | ORAL_TABLET | Freq: Two times a day (BID) | ORAL | 0 refills | Status: DC

## 2021-11-07 NOTE — BH Assessment (Signed)
Comprehensive Clinical Assessment (CCA) Screening, Triage and Referral Note  11/07/2021 Samuel Molina [id number]  Samuel Molina, 61 year old male who presents to [facility] ED voluntarily for treatment. Per triage note, Pt comes with c/o SI. Pt states the police and his sister brought him here. Pt states he had his nephew's gun and was trying to use it on himself this am. Pt states hx of strokes and he has been being treated differently bc of that. Pt states he used cocaine a couple of days ago. Pt denies any alcohol use.   During TTS assessment pt presents alert and oriented x 4, anxious but cooperative, and mood-congruent with affect. The pt does not appear to be responding to internal or external stimuli. Neither is the pt presenting with any delusional thinking. Pt verified the information provided to triage RN.   Pt identifies his main complaint to be that he is depressed because he feels "mistreated by those around him." Patient would not state who, but he comes to the ED often with similar symptoms complaining people do not like him because he had a stroke and one side of his face droops. Patient has been referred to outpatient resources for additional support; however patient will not follow up for care. Patient states he was at [facility] for 6 months and discharged in May. Patient admits to using cocaine 3 days ago. Pt denies current SI/HI/AH/VH. Patient reports he wants medication for his mood. When explaining outpatient providers are more beneficial to his needs, patient became irritable, upset and started yelling, using profanity.   Per Barbaraann Share, NP pt does not meet criteria for inpatient psychiatric admission.   Chief Complaint:  Chief Complaint  Patient presents with   SI   Visit Diagnosis: Cocaine abuse  Patient Reported Information How did you hear about [place]? Self  What Is the Reason for Your Visit/Call Today? Patient reports having depressed mood.  How Long Has  This Been Causing You Problems? > than 6 months  What Do You Feel Would Help You the Most Today? -- (Assessment)   Have You Recently Had Any Thoughts About Hurting Yourself? Yes  Are You Planning to Commit Suicide/Harm Yourself At This time? No   Have you Recently Had Thoughts About [place]? No  Are You Planning to Harm Someone at This Time? No  Explanation: No data recorded  Have You Used Any Alcohol or Drugs in the Past 24 Hours? Yes  How Long Ago Did You Use Drugs or Alcohol? No data recorded What Did You Use and How Much? Cocaine- unknown   Do You Currently Have a Therapist/Psychiatrist? No  Name of Therapist/Psychiatrist: No data recorded  Have You Been Recently Discharged From Any Office Practice or Programs? No  Explanation of Discharge From Practice/Program: No data recorded   CCA Screening Triage Referral Assessment Type of Contact: Face-to-Face  Telemedicine Service Delivery:   Is this Initial or Reassessment? No data recorded Date Telepsych consult ordered in [facility]:  No data recorded Time Telepsych consult ordered in [facility]:  No data recorded Location of Assessment: [facility] ED  Provider Location: [facility] ED   Collateral Involvement: None provided   Does Patient Have a Hannah? No data recorded Name and Contact of Legal Guardian: No data recorded If Minor and Not Living with Parent(s), Who has Custody? n/a  Is CPS involved or ever been involved? Never  Is APS involved or ever been involved? Never   Patient Determined To Be  At Risk for Harm To Self or Others Based on Review of Patient Reported Information or Presenting Complaint? No  Method: No data recorded Availability of Means: No data recorded Intent: No data recorded Notification Required: No data recorded Additional Information for Danger to Others Potential: No data recorded Additional Comments for Danger to Others Potential: No data recorded Are There Guns or  Other Weapons in Your Home? No data recorded Types of Guns/Weapons: No data recorded Are These Weapons Safely Secured?                            No data recorded Who Could Verify You Are Able To Have These Secured: No data recorded Do You Have any Outstanding Charges, Pending Court Dates, Parole/Probation? No data recorded Contacted To Inform of Risk of Harm To Self or Others: No data recorded  Does Patient Present under Involuntary Commitment? No  IVC Papers Initial File Date: 03/01/21   [place] of Residence: [place]   Patient Currently Receiving the Following Services: Not Receiving Services   Determination of Need: Emergent (2 hours)   Options For Referral: Outpatient Therapy; Chemical Dependency Intensive Outpatient Therapy (CDIOP)   Discharge Disposition:     Eula Fried, Counselor, LCAS-A

## 2021-11-07 NOTE — ED Provider Notes (Signed)
[facility] Provider Note    Event Date/Time   First MD Initiated Contact with Patient 11/07/21 0915     (approximate)   History   SI   HPI  Samuel Molina is a 61 y.o. male who reports since his stroke he has had a lot of difficulty controlling his emotions and his reactions to things.  He says he gets violent very easily.  He said that earlier this morning he had taken a relatives handgun and was going to shoot himself and that person however he could not get the safety to work and had found the gun was empty of bullets.  He was brought in by his sister and the police.      Physical Exam   Triage Vital Signs: ED Triage Vitals  Enc Vitals Group     BP 11/07/21 0915 (!) 158/109     Pulse Rate 11/07/21 0915 70     Resp 11/07/21 0915 18     Temp 11/07/21 0915 98 F (36.7 C)     Temp src --      SpO2 11/07/21 0915 97 %     Weight --      Height --      Head Circumference --      Peak Flow --      Pain Score 11/07/21 0912 3     Pain Loc --      Pain Edu? --      Excl. in [place]? --     Most recent vital signs: Vitals:   11/07/21 0915  BP: (!) 158/109  Pulse: 70  Resp: 18  Temp: 98 F (36.7 C)  SpO2: 97%     General: Awake, no distress.  CV:  Good peripheral perfusion.  Heart regular rate and rhythm no audible murmurs Resp:  Normal effort.  Lungs are clear Abd:  No distention.  Soft minimal periumbilical tenderness. Extremities: No edema.  He does have a 1-1/2 inch abscess on the posterior upper left leg.   ED Results / Procedures / Treatments   Labs (all labs ordered are listed, but only abnormal results are displayed) Labs Reviewed  COMPREHENSIVE METABOLIC PANEL - Abnormal; Notable for the following components:      Result Value   Glucose, Bld 152 (*)    Total Bilirubin 0.2 (*)    All other components within normal limits  SALICYLATE LEVEL - Abnormal; Notable for the following components:   Salicylate Lvl <1.6 (*)    All  other components within normal limits  ACETAMINOPHEN LEVEL - Abnormal; Notable for the following components:   Acetaminophen (Tylenol), Serum <10 (*)    All other components within normal limits  CBC - Abnormal; Notable for the following components:   MCH 25.6 (*)    All other components within normal limits  URINE DRUG SCREEN, QUALITATIVE ([facility] ONLY) - Abnormal; Notable for the following components:   Cocaine Metabolite,Ur [place] POSITIVE (*)    All other components within normal limits  ETHANOL     EKG     RADIOLOGY \  PROCEDURES: Oral consent obtained.  Wound cleaned with Betadine and anesthetized with 2% lidocaine with epi wound was incised with #11 blade pus was expressed wound was irrigated with very dilute baby to dine solution and saline.  This was done twice and was then packed with Nu Gauze patient tolerated very well.  Critical Care performed:   Procedures   MEDICATIONS ORDERED IN ED: Medications - No  data to display   IMPRESSION / MDM / [place] / ED COURSE  I reviewed the triage vital signs and the nursing notes. Patient seen and cleared by psychiatry.  We will let him go.  He will follow-up with [facility] I will give him doxycycline to make sure the wound gets better have him recheck in 2 days.    Patient's presentation is most consistent with exacerbation of chronic illness.    FINAL CLINICAL IMPRESSION(S) / ED DIAGNOSES   Final diagnoses:  Abscess of left leg     Rx / DC Orders   ED Discharge Orders          Ordered    doxycycline (ADOXA) 100 MG tablet  2 times daily        11/07/21 1118             Note:  This document was prepared using Dragon voice recognition software and may include unintentional dictation errors.   Nena Polio, MD 11/07/21 1123

## 2021-11-07 NOTE — Discharge Instructions (Addendum)
Take the doxycycline 1 pill twice a day.  Return for any increasing swelling, redness, fever or pain much worse than what you had before.  Recheck in 2 days to make sure the wound is doing well.  We might be able to remove the packing at that time.  You can shower but try to keep the wound clean and dry.  You can try some blue painters tape around a piece of plastic or plastic bag over the wound o keep the water off while you shower.  Get some gauze and put some gauze on the wound after the shower.  This will help keep it clean and help keep any oozing off your clothes.  Just be careful not to pull the packing out.  If you do pull the packing out you should be okay since we irrigated the wound very carefully but is better if you do not.  You can try to follow-up with [facility] for help managing your mood swings.  Try to avoid any other street drugs.  They will not help your mood swings.

## 2021-11-07 NOTE — ED Notes (Signed)
ED MD is in triage room with patient and this Probation officer.

## 2021-11-07 NOTE — ED Triage Notes (Signed)
Pt comes with c/o SI. Pt states the pOlice and his sister brought him here. Pt states he had his nephews gun and was trying to use it on himself this am.  Pt states hx of strokes and he has been being treated differently bc of that. Pt states he used cocaine couple days ago. Pt denies any alcohol use.

## 2021-11-07 NOTE — Consult Note (Signed)
[facility] Consult   Reason for Consult:  chronic depression/substance use Referring Physician:   EDP Patient Identification: Samuel Molina MRN:  [id number] Principal Diagnosis: Cocaine abuse ([place]) Diagnosis:  Principal Problem:   Cocaine abuse ([place]) Active Problems:   Severe recurrent major depression without psychotic features ([place])   Total Time spent with patient: 45 minutes  Subjective:   Samuel Molina is a 61 y.o. male patient presenting to ED with c/o spider bite and depression.  HPI:  Patient is well-known to this facility. He states what brings him in is his feelings of depression because "everyone treats me differently after my stroke and it makes me depressed." This is not a new complaint for him and he has presented to the ED several times in the past with the same/similar complaints to thi,; passive suicidal thoughts. He fails to follow through with outpatient treatment.   On evaluation, he is very irritable when he is asked if he has followed up with outpatient services that we have recommended. He reports that he has been in [place] "for 6 months," leaving there in May. He is vague about where he is living, but says he lives alone and the police and his sister dropped him off. He was alone in triage and came in voluntarily.   He admits to cocaine use, UDS positive for cocaine. Patient did not endorse suicidal thoughts, intent, or having access to gun to this provider. He stated he wanted to be admitted for his mood. Patient also has a spider bite that he wants the EDP to examine. Discussed with patient that he does not meet criteria for inpatient treatment and he can receive treatment for his depression and substance use on an outpatient basis at [facility]. Patient became extremely irritable, yelling loudly, and security arrived to room. At that point, writer left the room. EDP saw patient for his "spider bite."  Attempted to reach patient's  sister, [phone], Samuel Molina x 2, Left HIPPA compliant messages.   Patient does meet criteria for involuntary commitment at this time. He will be treated for his spider bite by the EDP. He may be psychiatrically discharged. Patient fails to follow up with outpatient services and inpatient psychiatric treatment is unlikely to be of further benefit.    Past Psychiatric History: cocaine abuse  Risk to Self:   Risk to Others:   Prior Inpatient Therapy:   Prior Outpatient Therapy:    Past Medical History:  Past Medical History:  Diagnosis Date   Anxiety    Depression    GERD (gastroesophageal reflux disease)    Obesity    Stroke ([place])    Substance abuse ([place])     Past Surgical History:  Procedure Laterality Date   ESOPHAGOGASTRODUODENOSCOPY (EGD) WITH PROPOFOL N/A 09/20/2017   Procedure: ESOPHAGOGASTRODUODENOSCOPY (EGD) WITH PROPOFOL;  Surgeon: Lin Landsman, MD;  Location: [facility] ENDOSCOPY;  Service: Gastroenterology;  Laterality: N/A;   KNEE SURGERY Right    Family History:  Family History  Problem Relation Age of Onset   Vasculitis Mother    Family Psychiatric  History: unknown Social History:  Social History   Substance and Sexual Activity  Alcohol Use Not Currently     Social History   Substance and Sexual Activity  Drug Use Yes   Types: Cocaine   Comment: used two days ago    Social History   Socioeconomic History   Marital status: Single    Spouse name: Not on file   Number  of children: Not on file   Years of education: Not on file   Highest education level: Not on file  Occupational History   Not on file  Tobacco Use   Smoking status: Every Day    Packs/day: 0.50    Types: Cigarettes   Smokeless tobacco: Never  Vaping Use   Vaping Use: Never used  Substance and Sexual Activity   Alcohol use: Not Currently   Drug use: Yes    Types: Cocaine    Comment: used two days ago   Sexual activity: Not Currently  Other Topics Concern   Not on  file  Social History Narrative   Not on file   Social Determinants of Health   Financial Resource Strain: Not on file  Food Insecurity: Not on file  Transportation Needs: Not on file  Physical Activity: Not on file  Stress: Not on file  Social Connections: Not on file   Additional Social History:    Allergies:   Allergies  Allergen Reactions   Acetaminophen Itching, Nausea And Vomiting and Rash    Labs:  Results for orders placed or performed during the hospital encounter of 11/07/21 (from the past 48 hour(s))  Comprehensive metabolic panel     Status: Abnormal   Collection Time: 11/07/21  9:16 AM  Result Value Ref Range   Sodium 141 135 - 145 mmol/L   Potassium 3.5 3.5 - 5.1 mmol/L   Chloride 111 98 - 111 mmol/L   CO2 24 22 - 32 mmol/L   Glucose, Bld 152 (H) 70 - 99 mg/dL    Comment: Glucose reference range applies only to samples taken after fasting for at least 8 hours.   BUN 9 6 - 20 mg/dL   Creatinine, Ser 0.99 0.61 - 1.24 mg/dL   Calcium 9.3 8.9 - 10.3 mg/dL   Total Protein 7.3 6.5 - 8.1 g/dL   Albumin 3.5 3.5 - 5.0 g/dL   AST 23 15 - 41 U/L   ALT 14 0 - 44 U/L   Alkaline Phosphatase 91 38 - 126 U/L   Total Bilirubin 0.2 (L) 0.3 - 1.2 mg/dL   GFR, Estimated >60 >60 mL/min    Comment: (NOTE) Calculated using the CKD-EPI Creatinine Equation (2021)    Anion gap 6 5 - 15    Comment: Performed at [facility], [address] [place], [place] [address]  Ethanol     Status: None   Collection Time: 11/07/21  9:16 AM  Result Value Ref Range   Alcohol, Ethyl (B) <10 <10 mg/dL    Comment: (NOTE) Lowest detectable limit for serum alcohol is 10 mg/dL.  For medical purposes only. Performed at [facility], [address] [place], [place] [address]   Salicylate level     Status: Abnormal   Collection Time: 11/07/21  9:16 AM  Result Value Ref Range   Salicylate Lvl <2.7 (L) 7.0 - 30.0 mg/dL    Comment: Performed at [facility], [address] [place], [place] [address]  Acetaminophen level     Status: Abnormal   Collection Time: 11/07/21  9:16 AM  Result Value Ref Range   Acetaminophen (Tylenol), Serum <10 (L) 10 - 30 ug/mL    Comment: (NOTE) Therapeutic concentrations vary significantly. A range of 10-30 ug/mL  may be an effective concentration for many patients. However, some  are best treated at concentrations outside of this range. Acetaminophen concentrations >150 ug/mL at 4 hours after ingestion  and >50  ug/mL at 12 hours after ingestion are often associated with  toxic reactions.  Performed at [facility], [address] [place], [place] [address]   cbc     Status: Abnormal   Collection Time: 11/07/21  9:16 AM  Result Value Ref Range   WBC 8.2 4.0 - 10.5 K/uL   RBC 5.59 4.22 - 5.81 MIL/uL   Hemoglobin 14.3 13.0 - 17.0 g/dL   HCT 46.9 39.0 - 52.0 %   MCV 83.9 80.0 - 100.0 fL   MCH 25.6 (L) 26.0 - 34.0 pg   MCHC 30.5 30.0 - 36.0 g/dL   RDW 14.1 11.5 - 15.5 %   Platelets 323 150 - 400 K/uL   nRBC 0.0 0.0 - 0.2 %    Comment: Performed at [facility], [address], [place], [place] [address]  Urine Drug Screen, Qualitative     Status: Abnormal   Collection Time: 11/07/21  9:16 AM  Result Value Ref Range   Tricyclic, Ur Screen NONE DETECTED NONE DETECTED   Amphetamines, Ur Screen NONE DETECTED NONE DETECTED   MDMA (Ecstasy)Ur Screen NONE DETECTED NONE DETECTED   Cocaine Metabolite,Ur [place] POSITIVE (A) NONE DETECTED   Opiate, Ur Screen NONE DETECTED NONE DETECTED   Phencyclidine (PCP) Ur S NONE DETECTED NONE DETECTED   Cannabinoid 50 Ng, Ur [place] NONE DETECTED NONE DETECTED   Barbiturates, Ur Screen NONE DETECTED NONE DETECTED   Benzodiazepine, Ur Scrn NONE DETECTED NONE DETECTED   Methadone Scn, Ur NONE DETECTED NONE DETECTED    Comment: (NOTE) Tricyclics + metabolites, urine    Cutoff 1000 ng/mL Amphetamines + metabolites, urine  Cutoff 1000 ng/mL MDMA (Ecstasy), urine               Cutoff 500 ng/mL Cocaine Metabolite, urine          Cutoff 300 ng/mL Opiate + metabolites, urine        Cutoff 300 ng/mL Phencyclidine (PCP), urine         Cutoff 25 ng/mL Cannabinoid, urine                 Cutoff 50 ng/mL Barbiturates + metabolites, urine  Cutoff 200 ng/mL Benzodiazepine, urine              Cutoff 200 ng/mL Methadone, urine                   Cutoff 300 ng/mL  The urine drug screen provides only a preliminary, unconfirmed analytical test result and should not be used for non-medical purposes. Clinical consideration and professional judgment should be applied to any positive drug screen result due to possible interfering substances. A more specific alternate chemical method must be used in order to obtain a confirmed analytical result. Gas chromatography / mass spectrometry (GC/MS) is the preferred confirm atory method. Performed at [facility], [address] [place], [place] [address]     No current facility-administered medications for this encounter.   Current Outpatient Medications  Medication Sig Dispense Refill   doxycycline (ADOXA) 100 MG tablet Take 1 tablet (100 mg total) by mouth 2 (two) times daily. 14 tablet 0   amoxicillin-clavulanate (AUGMENTIN) 875-125 MG tablet Take 1 tablet by mouth every 12 (twelve) hours. (Patient not taking: Reported on 12/17/2020) 3 tablet 0   aspirin EC 81 MG tablet Take 81 mg by mouth daily. Swallow whole.     atorvastatin (LIPITOR) 80 MG tablet Take 80 mg by mouth every evening.  citalopram (CELEXA) 10 MG tablet Take 1 tablet (10 mg total) by mouth daily. 30 tablet 1   diclofenac Sodium (VOLTAREN) 1 % GEL Apply 4 g topically 4 (four) times daily.     famotidine (PEPCID) 20 MG tablet Take 20 mg by mouth 2 (two) times daily.     gabapentin (NEURONTIN) 300 MG capsule Take 1 capsule (300 mg total) by mouth 3 (three) times daily. 90 capsule 0   gabapentin (NEURONTIN) 400 MG capsule Take 1 capsule (400 mg total)  by mouth 3 (three) times daily. 90 capsule 1   lisinopril (ZESTRIL) 20 MG tablet Take 1 tablet (20 mg total) by mouth daily. 30 tablet 1   naproxen (NAPROSYN) 500 MG tablet Take 1 tablet (500 mg total) by mouth 2 (two) times daily with a meal. 20 tablet 0   QUEtiapine (SEROQUEL) 300 MG tablet Take 1 tablet (300 mg total) by mouth at bedtime. 30 tablet 1    Musculoskeletal: Strength & Muscle Tone: within normal limits Gait & Station:  did not observe Patient leans: N/A            Psychiatric Specialty Exam:  Presentation  General Appearance: Appropriate for Environment  Eye Contact:Good  Speech:Clear and Coherent  Speech Volume:Normal  Handedness:Right   Mood and Affect  Mood:Irritable  Affect:Congruent   Thought Process  Thought Processes:Coherent  Descriptions of Associations:Intact  Orientation:Full (Time, Place and Person)  Thought Content:WDL  History of Schizophrenia/Schizoaffective disorder:No data recorded Duration of Psychotic Symptoms:No data recorded Hallucinations:Hallucinations: None  Ideas of Reference:None  Suicidal Thoughts:Suicidal Thoughts: No  Homicidal Thoughts:Homicidal Thoughts: No   Sensorium  Memory:Immediate Poor  Judgment:Poor  Insight:Poor   Executive Functions  Concentration:Poor  Attention Span:Fair  [place]   Psychomotor Activity  Psychomotor Activity:Psychomotor Activity: Normal   Assets  Assets:Communication Skills; Housing; Resilience   Sleep  Sleep:Sleep: Fair   Physical Exam: Physical Exam Vitals and nursing note reviewed.  HENT:     Head: Normocephalic.     Nose: No congestion or rhinorrhea.  Eyes:     General:        Right eye: No discharge.        Left eye: No discharge.  Cardiovascular:     Rate and Rhythm: Normal rate.  Pulmonary:     Effort: Pulmonary effort is normal.  Musculoskeletal:        General: Normal range of motion.      Cervical back: Normal range of motion.  Skin:    General: Skin is dry.  Neurological:     Mental Status: He is alert and oriented to person, place, and time.  Psychiatric:        Mood and Affect: Affect is angry.        Speech: Speech normal.        Behavior: Behavior is agitated.        Thought Content: Thought content is not paranoid or delusional. Thought content does not include homicidal or suicidal ideation.        Cognition and Memory: Cognition normal.        Judgment: Judgment is impulsive.    Review of Systems  HENT: Negative.    Respiratory: Negative.    Cardiovascular: Negative.   Psychiatric/Behavioral:  Positive for depression (stable/at baseline) and substance abuse. Negative for hallucinations, memory loss and suicidal ideas. The patient is not nervous/anxious and does not have insomnia.    Blood pressure (!) 158/109, pulse 70, temperature 98  F (36.7 C), resp. rate 18, SpO2 97 %. There is no height or weight on file to calculate BMI.  Treatment Plan Summary: Patient does not meet criteria for inpatient psychiatric treatment  Disposition: No evidence of imminent risk to self or others at present.   Patient does not meet criteria for psychiatric inpatient admission. Supportive therapy provided about ongoing stressors. Discussed crisis plan, support from social network, calling 911, coming to the Emergency Department, and calling Suicide Hotline.  Sherlon Handing, NP 11/07/2021 3:17 PM

## 2021-11-07 NOTE — ED Notes (Addendum)
Patient is being dressed out with this Probation officer, hospital attire scrubs (top and bottom) was provided for patient. Patient belonging bag was also provided for patient items. Bag 2 of 2 were labeled with patient sticker and green sticker.   Belongings  Pearline Cables t-shirt Blue jeans [employer] White shoes White socks Brown belt  Personal book bag (bag 2 of 2)  Facilities manager

## 2021-11-08 ENCOUNTER — Other Ambulatory Visit: Payer: Self-pay

## 2021-11-08 ENCOUNTER — Emergency Department: Payer: 59

## 2021-11-08 ENCOUNTER — Inpatient Hospital Stay
Admission: EM | Admit: 2021-11-08 | Discharge: 2021-11-24 | DRG: 603 | Disposition: A | Discharge status: Home Health | Payer: 59 | Attending: Internal Medicine | Admitting: Internal Medicine

## 2021-11-08 ENCOUNTER — Encounter: Payer: Self-pay | Admitting: Emergency Medicine

## 2021-11-08 DIAGNOSIS — L03116 Cellulitis of left lower limb: Secondary | ICD-10-CM | POA: Diagnosis not present

## 2021-11-08 DIAGNOSIS — R45851 Suicidal ideations: Secondary | ICD-10-CM | POA: Diagnosis not present

## 2021-11-08 DIAGNOSIS — F418 Other specified anxiety disorders: Secondary | ICD-10-CM | POA: Diagnosis present

## 2021-11-08 DIAGNOSIS — K224 Dyskinesia of esophagus: Secondary | ICD-10-CM | POA: Diagnosis present

## 2021-11-08 DIAGNOSIS — F32A Depression, unspecified: Secondary | ICD-10-CM | POA: Diagnosis present

## 2021-11-08 DIAGNOSIS — Z91148 Patient's other noncompliance with medication regimen for other reason: Secondary | ICD-10-CM

## 2021-11-08 DIAGNOSIS — B9562 Methicillin resistant Staphylococcus aureus infection as the cause of diseases classified elsewhere: Secondary | ICD-10-CM | POA: Diagnosis present

## 2021-11-08 DIAGNOSIS — K5900 Constipation, unspecified: Secondary | ICD-10-CM | POA: Diagnosis not present

## 2021-11-08 DIAGNOSIS — I161 Hypertensive emergency: Secondary | ICD-10-CM | POA: Diagnosis present

## 2021-11-08 DIAGNOSIS — Z6830 Body mass index (BMI) 30.0-30.9, adult: Secondary | ICD-10-CM

## 2021-11-08 DIAGNOSIS — R2990 Unspecified symptoms and signs involving the nervous system: Principal | ICD-10-CM

## 2021-11-08 DIAGNOSIS — K635 Polyp of colon: Secondary | ICD-10-CM | POA: Diagnosis present

## 2021-11-08 DIAGNOSIS — Z720 Tobacco use: Secondary | ICD-10-CM | POA: Diagnosis present

## 2021-11-08 DIAGNOSIS — I160 Hypertensive urgency: Secondary | ICD-10-CM | POA: Diagnosis not present

## 2021-11-08 DIAGNOSIS — L02416 Cutaneous abscess of left lower limb: Principal | ICD-10-CM | POA: Diagnosis present

## 2021-11-08 DIAGNOSIS — R1013 Epigastric pain: Secondary | ICD-10-CM | POA: Diagnosis present

## 2021-11-08 DIAGNOSIS — K625 Hemorrhage of anus and rectum: Secondary | ICD-10-CM | POA: Diagnosis present

## 2021-11-08 DIAGNOSIS — I10 Essential (primary) hypertension: Secondary | ICD-10-CM | POA: Diagnosis present

## 2021-11-08 DIAGNOSIS — R252 Cramp and spasm: Secondary | ICD-10-CM | POA: Diagnosis not present

## 2021-11-08 DIAGNOSIS — R4781 Slurred speech: Secondary | ICD-10-CM | POA: Diagnosis not present

## 2021-11-08 DIAGNOSIS — K640 First degree hemorrhoids: Secondary | ICD-10-CM | POA: Diagnosis present

## 2021-11-08 DIAGNOSIS — T39395A Adverse effect of other nonsteroidal anti-inflammatory drugs [NSAID], initial encounter: Secondary | ICD-10-CM | POA: Diagnosis present

## 2021-11-08 DIAGNOSIS — E785 Hyperlipidemia, unspecified: Secondary | ICD-10-CM | POA: Diagnosis not present

## 2021-11-08 DIAGNOSIS — K2100 Gastro-esophageal reflux disease with esophagitis, without bleeding: Secondary | ICD-10-CM | POA: Diagnosis present

## 2021-11-08 DIAGNOSIS — K2970 Gastritis, unspecified, without bleeding: Secondary | ICD-10-CM | POA: Diagnosis present

## 2021-11-08 DIAGNOSIS — K5730 Diverticulosis of large intestine without perforation or abscess without bleeding: Secondary | ICD-10-CM | POA: Diagnosis present

## 2021-11-08 DIAGNOSIS — R531 Weakness: Secondary | ICD-10-CM

## 2021-11-08 DIAGNOSIS — F419 Anxiety disorder, unspecified: Secondary | ICD-10-CM | POA: Diagnosis present

## 2021-11-08 DIAGNOSIS — R69 Illness, unspecified: Secondary | ICD-10-CM | POA: Diagnosis not present

## 2021-11-08 DIAGNOSIS — Z8673 Personal history of transient ischemic attack (TIA), and cerebral infarction without residual deficits: Secondary | ICD-10-CM | POA: Diagnosis present

## 2021-11-08 DIAGNOSIS — F4325 Adjustment disorder with mixed disturbance of emotions and conduct: Secondary | ICD-10-CM | POA: Diagnosis not present

## 2021-11-08 DIAGNOSIS — R4689 Other symptoms and signs involving appearance and behavior: Secondary | ICD-10-CM | POA: Diagnosis not present

## 2021-11-08 DIAGNOSIS — E669 Obesity, unspecified: Secondary | ICD-10-CM | POA: Diagnosis present

## 2021-11-08 DIAGNOSIS — Z79899 Other long term (current) drug therapy: Secondary | ICD-10-CM

## 2021-11-08 DIAGNOSIS — R7881 Bacteremia: Secondary | ICD-10-CM | POA: Diagnosis present

## 2021-11-08 DIAGNOSIS — F609 Personality disorder, unspecified: Secondary | ICD-10-CM | POA: Diagnosis present

## 2021-11-08 DIAGNOSIS — B955 Unspecified streptococcus as the cause of diseases classified elsewhere: Secondary | ICD-10-CM

## 2021-11-08 DIAGNOSIS — T63301A Toxic effect of unspecified spider venom, accidental (unintentional), initial encounter: Secondary | ICD-10-CM | POA: Diagnosis present

## 2021-11-08 DIAGNOSIS — F17210 Nicotine dependence, cigarettes, uncomplicated: Secondary | ICD-10-CM | POA: Diagnosis present

## 2021-11-08 DIAGNOSIS — R29810 Facial weakness: Secondary | ICD-10-CM | POA: Diagnosis not present

## 2021-11-08 DIAGNOSIS — F1410 Cocaine abuse, uncomplicated: Secondary | ICD-10-CM | POA: Diagnosis present

## 2021-11-08 DIAGNOSIS — I639 Cerebral infarction, unspecified: Secondary | ICD-10-CM | POA: Diagnosis present

## 2021-11-08 DIAGNOSIS — I69354 Hemiplegia and hemiparesis following cerebral infarction affecting left non-dominant side: Secondary | ICD-10-CM

## 2021-11-08 DIAGNOSIS — R001 Bradycardia, unspecified: Secondary | ICD-10-CM | POA: Diagnosis not present

## 2021-11-08 LAB — CBC
HCT: 44.7 % (ref 39.0–52.0)
Hemoglobin: 13.8 g/dL (ref 13.0–17.0)
MCH: 25.7 pg — ABNORMAL LOW (ref 26.0–34.0)
MCHC: 30.9 g/dL (ref 30.0–36.0)
MCV: 83.4 fL (ref 80.0–100.0)
Platelets: 325 10*3/uL (ref 150–400)
RBC: 5.36 MIL/uL (ref 4.22–5.81)
RDW: 14.2 % (ref 11.5–15.5)
WBC: 8.3 10*3/uL (ref 4.0–10.5)
nRBC: 0 % (ref 0.0–0.2)

## 2021-11-08 LAB — HIV ANTIBODY (ROUTINE TESTING W REFLEX): HIV Screen 4th Generation wRfx: NONREACTIVE

## 2021-11-08 LAB — COMPREHENSIVE METABOLIC PANEL
ALT: 15 U/L (ref 0–44)
AST: 21 U/L (ref 15–41)
Albumin: 3.8 g/dL (ref 3.5–5.0)
Alkaline Phosphatase: 84 U/L (ref 38–126)
Anion gap: 7 (ref 5–15)
BUN: 10 mg/dL (ref 6–20)
CO2: 27 mmol/L (ref 22–32)
Calcium: 9.3 mg/dL (ref 8.9–10.3)
Chloride: 108 mmol/L (ref 98–111)
Creatinine, Ser: 0.85 mg/dL (ref 0.61–1.24)
GFR, Estimated: >60 mL/min (ref >60)
Glucose, Bld: 87 mg/dL (ref 70–99)
Potassium: 3.9 mmol/L (ref 3.5–5.1)
Sodium: 142 mmol/L (ref 135–145)
Total Bilirubin: 0.4 mg/dL (ref 0.3–1.2)
Total Protein: 7.6 g/dL (ref 6.5–8.1)

## 2021-11-08 LAB — URINALYSIS, ROUTINE W REFLEX MICROSCOPIC
Bilirubin Urine: NEGATIVE
Glucose, UA: NEGATIVE mg/dL
Hgb urine dipstick: NEGATIVE
Ketones, ur: NEGATIVE mg/dL
Leukocytes,Ua: NEGATIVE
Nitrite: NEGATIVE
Protein, ur: NEGATIVE mg/dL
Specific Gravity, Urine: 1.043 — ABNORMAL HIGH (ref 1.005–1.030)
pH: 7 (ref 5.0–8.0)

## 2021-11-08 LAB — C-REACTIVE PROTEIN: CRP: <0.5 mg/dL (ref <1.0)

## 2021-11-08 LAB — URINE DRUG SCREEN, QUALITATIVE (ARMC ONLY)
Amphetamines, Ur Screen: NOT DETECTED
Barbiturates, Ur Screen: NOT DETECTED
Benzodiazepine, Ur Scrn: NOT DETECTED
Cannabinoid 50 Ng, Ur ~~LOC~~: NOT DETECTED
Cocaine Metabolite,Ur ~~LOC~~: POSITIVE — AB
MDMA (Ecstasy)Ur Screen: NOT DETECTED
Methadone Scn, Ur: NOT DETECTED
Opiate, Ur Screen: NOT DETECTED
Phencyclidine (PCP) Ur S: NOT DETECTED
Tricyclic, Ur Screen: NOT DETECTED

## 2021-11-08 LAB — DIFFERENTIAL
Abs Immature Granulocytes: 0.03 10*3/uL (ref 0.00–0.07)
Basophils Absolute: 0 10*3/uL (ref 0.0–0.1)
Basophils Relative: 1 %
Eosinophils Absolute: 0.1 10*3/uL (ref 0.0–0.5)
Eosinophils Relative: 1 %
Immature Granulocytes: 0 %
Lymphocytes Relative: 37 %
Lymphs Abs: 3.1 10*3/uL (ref 0.7–4.0)
Monocytes Absolute: 1 10*3/uL (ref 0.1–1.0)
Monocytes Relative: 12 %
Neutro Abs: 4.1 10*3/uL (ref 1.7–7.7)
Neutrophils Relative %: 49 %

## 2021-11-08 LAB — PROTIME-INR
INR: 1 (ref 0.8–1.2)
Prothrombin Time: 13.4 seconds (ref 11.4–15.2)

## 2021-11-08 LAB — ETHANOL: Alcohol, Ethyl (B): <10 mg/dL (ref <10)

## 2021-11-08 LAB — SEDIMENTATION RATE: Sed Rate: 6 mm/hr (ref 0–20)

## 2021-11-08 LAB — CBG MONITORING, ED: Glucose-Capillary: 82 mg/dL (ref 70–99)

## 2021-11-08 MED ORDER — IBUPROFEN 400 MG PO TABS
400.0000 mg | ORAL_TABLET | Freq: Four times a day (QID) | ORAL | Status: DC | PRN
  Administered 2021-11-09 – 2021-11-16 (×9): 400 mg via ORAL
  Filled 2021-11-08 (×9): qty 1

## 2021-11-08 MED ORDER — CITALOPRAM HYDROBROMIDE 20 MG PO TABS
10.0000 mg | ORAL_TABLET | Freq: Every day | ORAL | Status: DC
  Administered 2021-11-08 – 2021-11-24 (×17): 10 mg via ORAL
  Filled 2021-11-08 (×17): qty 1

## 2021-11-08 MED ORDER — QUETIAPINE FUMARATE 300 MG PO TABS
300.0000 mg | ORAL_TABLET | Freq: Every day | ORAL | Status: DC
Start: 2021-11-08 — End: 2021-11-08

## 2021-11-08 MED ORDER — ASPIRIN 81 MG PO TBEC
81.0000 mg | DELAYED_RELEASE_TABLET | Freq: Every day | ORAL | Status: DC
  Administered 2021-11-08 – 2021-11-22 (×15): 81 mg via ORAL
  Filled 2021-11-08 (×15): qty 1

## 2021-11-08 MED ORDER — PANTOPRAZOLE SODIUM 40 MG IV SOLR
40.0000 mg | Freq: Once | INTRAVENOUS | Status: AC
  Administered 2021-11-08: 40 mg via INTRAVENOUS
  Filled 2021-11-08: qty 10

## 2021-11-08 MED ORDER — LORAZEPAM 2 MG/ML IJ SOLN
1.0000 mg | Freq: Once | INTRAMUSCULAR | Status: AC
  Administered 2021-11-08: 1 mg via INTRAVENOUS
  Filled 2021-11-08: qty 1

## 2021-11-08 MED ORDER — VANCOMYCIN HCL 1500 MG/300ML IV SOLN
1500.0000 mg | Freq: Two times a day (BID) | INTRAVENOUS | Status: DC
  Administered 2021-11-09 – 2021-11-23 (×28): 1500 mg via INTRAVENOUS
  Filled 2021-11-08 (×32): qty 300

## 2021-11-08 MED ORDER — LORAZEPAM 2 MG/ML IJ SOLN: 1.0000 mg | Freq: Three times a day (TID) | INTRAMUSCULAR | Status: DC | PRN

## 2021-11-08 MED ORDER — ONDANSETRON HCL 4 MG/2ML IJ SOLN
4.0000 mg | Freq: Three times a day (TID) | INTRAMUSCULAR | Status: DC | PRN
  Administered 2021-11-18 – 2021-11-21 (×3): 4 mg via INTRAVENOUS
  Filled 2021-11-08 (×3): qty 2

## 2021-11-08 MED ORDER — QUETIAPINE FUMARATE 25 MG PO TABS
100.0000 mg | ORAL_TABLET | Freq: Every day | ORAL | Status: DC
  Administered 2021-11-08 – 2021-11-23 (×16): 100 mg via ORAL
  Filled 2021-11-08 (×16): qty 4

## 2021-11-08 MED ORDER — KETOROLAC TROMETHAMINE 30 MG/ML IJ SOLN
30.0000 mg | Freq: Four times a day (QID) | INTRAMUSCULAR | Status: AC | PRN
  Administered 2021-11-08 – 2021-11-13 (×9): 30 mg via INTRAVENOUS
  Filled 2021-11-08 (×9): qty 1

## 2021-11-08 MED ORDER — VANCOMYCIN HCL 2000 MG/400ML IV SOLN
2000.0000 mg | Freq: Once | INTRAVENOUS | Status: AC
  Administered 2021-11-08: 2000 mg via INTRAVENOUS
  Filled 2021-11-08: qty 400

## 2021-11-08 MED ORDER — HALOPERIDOL LACTATE 5 MG/ML IJ SOLN
2.0000 mg | Freq: Once | INTRAMUSCULAR | Status: DC
  Filled 2021-11-08: qty 1

## 2021-11-08 MED ORDER — LISINOPRIL 20 MG PO TABS
20.0000 mg | ORAL_TABLET | Freq: Every day | ORAL | Status: DC
  Administered 2021-11-08 – 2021-11-11 (×4): 20 mg via ORAL
  Filled 2021-11-08: qty 1
  Filled 2021-11-08: qty 2
  Filled 2021-11-08 (×2): qty 1

## 2021-11-08 MED ORDER — SODIUM CHLORIDE 0.9 % IV SOLN
1.0000 g | Freq: Once | INTRAVENOUS | Status: AC
  Administered 2021-11-08: 1 g via INTRAVENOUS
  Filled 2021-11-08: qty 10

## 2021-11-08 MED ORDER — NICOTINE 21 MG/24HR TD PT24
21.0000 mg | MEDICATED_PATCH | Freq: Every day | TRANSDERMAL | Status: DC
  Filled 2021-11-08 (×7): qty 1

## 2021-11-08 MED ORDER — ENOXAPARIN SODIUM 40 MG/0.4ML IJ SOSY
40.0000 mg | PREFILLED_SYRINGE | INTRAMUSCULAR | Status: DC
  Administered 2021-11-08 – 2021-11-21 (×14): 40 mg via SUBCUTANEOUS
  Filled 2021-11-08 (×13): qty 0.4

## 2021-11-08 MED ORDER — ATORVASTATIN CALCIUM 20 MG PO TABS
80.0000 mg | ORAL_TABLET | Freq: Every evening | ORAL | Status: DC
  Administered 2021-11-08 – 2021-11-23 (×16): 80 mg via ORAL
  Filled 2021-11-08 (×16): qty 4

## 2021-11-08 MED ORDER — SODIUM CHLORIDE 0.9 % IV SOLN: 1.0000 g | INTRAVENOUS | Status: DC

## 2021-11-08 MED ORDER — VANCOMYCIN HCL 1500 MG/300ML IV SOLN
1500.0000 mg | Freq: Once | INTRAVENOUS | Status: DC
  Filled 2021-11-08: qty 300

## 2021-11-08 MED ORDER — HYDRALAZINE HCL 20 MG/ML IJ SOLN
5.0000 mg | INTRAMUSCULAR | Status: DC | PRN
Start: 2021-11-08 — End: 2021-11-24

## 2021-11-08 MED ORDER — GABAPENTIN 300 MG PO CAPS
300.0000 mg | ORAL_CAPSULE | Freq: Three times a day (TID) | ORAL | Status: DC
  Administered 2021-11-08 – 2021-11-24 (×48): 300 mg via ORAL
  Filled 2021-11-08 (×48): qty 1

## 2021-11-08 MED ORDER — IOHEXOL 350 MG/ML SOLN
75.0000 mL | Freq: Once | INTRAVENOUS | Status: AC | PRN
  Administered 2021-11-08: 75 mL via INTRAVENOUS

## 2021-11-08 MED ORDER — FAMOTIDINE 20 MG PO TABS
20.0000 mg | ORAL_TABLET | Freq: Two times a day (BID) | ORAL | Status: DC
  Administered 2021-11-08 – 2021-11-12 (×9): 20 mg via ORAL
  Filled 2021-11-08 (×9): qty 1

## 2021-11-08 MED ORDER — HYDRALAZINE HCL 20 MG/ML IJ SOLN
10.0000 mg | Freq: Once | INTRAMUSCULAR | Status: AC
  Administered 2021-11-08: 10 mg via INTRAVENOUS
  Filled 2021-11-08: qty 1

## 2021-11-08 NOTE — ED Provider Notes (Signed)
[facility] Provider Note    Event Date/Time   First MD Initiated Contact with Patient 11/08/21 0510     (approximate)   History   Code Stroke   HPI  Samuel Molina is a 61 y.o. male with history of CVA with previous left-sided weakness and numbness with no chronic deficits, obesity, substance abuse, depression and anxiety who presents to the emergency department with EMS as a code stroke.  Patient states he was walking home from work when he started having left-sided numbness and weakness in the face, arm and leg.  States the leg gave out on him and he fell to the ground.  No loss of consciousness.  He is no longer on blood thinners.  He was seen here yesterday for an abscess to the left lower extremity that he states was from a "spider bite".  He was also suicidal at that time and was discharged from the emergency department.  Denies drug and ETOH use.  Denies headache.  Denies head injury.  States his last stroke was at [facility] in 2021 that he states was due to a "blood clot".  For EMS, BP was 160/101, CBG was 72.    History provided by patient and EMS.    Past Medical History:  Diagnosis Date   Anxiety    Depression    GERD (gastroesophageal reflux disease)    Obesity    Stroke [facility])    Substance abuse ([place])     Past Surgical History:  Procedure Laterality Date   ESOPHAGOGASTRODUODENOSCOPY (EGD) WITH PROPOFOL N/A 09/20/2017   Procedure: ESOPHAGOGASTRODUODENOSCOPY (EGD) WITH PROPOFOL;  Surgeon: Lin Landsman, MD;  Location: [facility] ENDOSCOPY;  Service: Gastroenterology;  Laterality: N/A;   KNEE SURGERY Right     MEDICATIONS:  Prior to Admission medications   Medication Sig Start Date End Date Taking? Authorizing Provider  amoxicillin-clavulanate (AUGMENTIN) 875-125 MG tablet Take 1 tablet by mouth every 12 (twelve) hours. Patient not taking: Reported on 12/17/2020 11/11/20   [name], MD  aspirin EC 81 MG tablet Take 81 mg by mouth  daily. Swallow whole.    [provider]  atorvastatin (LIPITOR) 80 MG tablet Take 80 mg by mouth every evening.    [provider]  citalopram (CELEXA) 10 MG tablet Take 1 tablet (10 mg total) by mouth daily. 11/12/20   [name], MD  diclofenac Sodium (VOLTAREN) 1 % GEL Apply 4 g topically 4 (four) times daily.    [provider]  doxycycline (ADOXA) 100 MG tablet Take 1 tablet (100 mg total) by mouth 2 (two) times daily. 11/07/21   Nena Polio, MD  famotidine (PEPCID) 20 MG tablet Take 20 mg by mouth 2 (two) times daily.    [provider]  gabapentin (NEURONTIN) 300 MG capsule Take 1 capsule (300 mg total) by mouth 3 (three) times daily. 10/01/21 10/31/21  Menshew, Dannielle Karvonen, PA-C  gabapentin (NEURONTIN) 400 MG capsule Take 1 capsule (400 mg total) by mouth 3 (three) times daily. 11/11/20   [name], MD  lisinopril (ZESTRIL) 20 MG tablet Take 1 tablet (20 mg total) by mouth daily. 11/12/20   [name], MD  naproxen (NAPROSYN) 500 MG tablet Take 1 tablet (500 mg total) by mouth 2 (two) times daily with a meal. 10/01/21   Carrie Mew, MD  QUEtiapine (SEROQUEL) 300 MG tablet Take 1 tablet (300 mg total) by mouth at bedtime. 11/11/20   [name], MD  omeprazole (PRILOSEC OTC) 20 MG tablet Take 2 tablets (40 mg total) by mouth 2 (two) times daily before a meal. 09/19/17 01/29/19  Lin Landsman, MD    Physical Exam   Triage Vital Signs: ED Triage Vitals  Enc Vitals Group     BP 11/08/21 0531 (!) 203/112     Pulse Rate 11/08/21 0531 (!) 56     Resp 11/08/21 0531 16     Temp --      Temp src --      SpO2 11/08/21 0531 100 %     Weight 11/08/21 0532 224 lb 13.9 oz (102 kg)     Height --      Head Circumference --      Peak Flow --      Pain Score --      Pain Loc --      Pain Edu? --      Excl. in [place]? --     Most recent vital signs: Vitals:   11/08/21 0532 11/08/21 0548  BP: (!) 188/102 (!) 169/96  Pulse:  60   Resp:  16  Temp:  98.3 F (36.8 C)  SpO2:  100%    CONSTITUTIONAL: Alert and oriented x 3 and responds appropriately to questions.  Chronically ill-appearing HEAD: Normocephalic, atraumatic EYES: Conjunctivae clear, pupils appear equal, sclera nonicteric; EOMI ENT: normal nose; moist mucous membranes NECK: Supple, normal ROM CARD: RRR; S1 and S2 appreciated; no murmurs, no clicks, no rubs, no gallops RESP: Normal chest excursion without splinting or tachypnea; breath sounds clear and equal bilaterally; no wheezes, no rhonchi, no rales, no hypoxia or respiratory distress, speaking full sentences ABD/GI: Normal bowel sounds; non-distended; soft, non-tender, no rebound, no guarding, no peritoneal signs BACK: The back appears normal EXT: Normal ROM in all joints; no deformity noted, no edema; no cyanosis SKIN: Normal color for age and race; warm; no rash on exposed skin NEURO: Patient has left-sided facial droop and difficulty holding left arm and leg up against gravity.  Reports diminished sensation in the left face, arm and leg compared to the right.  Mild dysarthria that seems to improve during the examination.  No aphasia. NIHSS 7.  Exams appear to be fluctuating and inconsistent.  At times patient has normal grip strength in his left hand and then was able to lift his left arm up to put his gown on.  His dysarthria resolved and then came back during history.  His facial asymmetry seems to be more from poor effort. PSYCH: The patient's mood and manner are appropriate.   ED Results / Procedures / Treatments   LABS: (all labs ordered are listed, but only abnormal results are displayed) Labs Reviewed  CBC - Abnormal; Notable for the following components:      Result Value   MCH 25.7 (*)    All other components within normal limits  PROTIME-INR  DIFFERENTIAL  COMPREHENSIVE METABOLIC PANEL  ETHANOL  URINE DRUG SCREEN, QUALITATIVE ([facility] ONLY)  URINALYSIS, ROUTINE W REFLEX MICROSCOPIC   CBG MONITORING, ED     EKG:  EKG Interpretation  Date/Time:  Tuesday November 08 2021 05:45:14 EDT Ventricular Rate:  59 PR Interval:  153 QRS Duration: 107 QT Interval:  480 QTC Calculation: 476 R Axis:   -36 Text Interpretation: Sinus rhythm Left axis deviation Posterior infarct, old Abnormal T, consider ischemia, diffuse leads Minimal ST elevation, anterior leads No significant change since last tracing Confirmed by Eurika Sandy, [name] [phone]) on 11/08/2021 6:31:41 AM  RADIOLOGY: My personal review and interpretation of imaging:  CTA head and neck shows no LVO.  MRI brain shows no CVA.    I have personally reviewed all radiology reports.   MR BRAIN WO CONTRAST  Result Date: 11/08/2021 CLINICAL DATA:  Left-sided weakness with slurred speech. EXAM: MRI HEAD WITHOUT CONTRAST TECHNIQUE: Multiplanar, multiecho pulse sequences of the brain and surrounding structures were obtained without intravenous contrast. COMPARISON:  04/28/2021 FINDINGS: Brain: No acute infarction, hemorrhage, hydrocephalus, extra-axial collection or mass effect. Subcentimeter lipoma posterior to the left inferior colliculus. Vascular: Major flow voids are preserved.  There was preceding CTA. Skull and upper cervical spine: No focal marrow lesion Sinuses/Orbits: Negative IMPRESSION: Stable motion degraded brain MRI.  No acute infarct. Electronically Signed   By: Jorje Guild M.D.   On: 11/08/2021 07:15   CT ANGIO HEAD NECK W WO CM  Result Date: 11/08/2021 CLINICAL DATA:  Stroke follow-up EXAM: CT ANGIOGRAPHY HEAD AND NECK TECHNIQUE: Multidetector CT imaging of the head and neck was performed using the standard protocol during bolus administration of intravenous contrast. Multiplanar CT image reconstructions and MIPs were obtained to evaluate the vascular anatomy. Carotid stenosis measurements (when applicable) are obtained utilizing NASCET criteria, using the distal internal carotid diameter as the denominator.  RADIATION DOSE REDUCTION: This exam was performed according to the departmental dose-optimization program which includes automated exposure control, adjustment of the mA and/or kV according to patient size and/or use of iterative reconstruction technique. CONTRAST:  80m OMNIPAQUE IOHEXOL 350 MG/ML SOLN COMPARISON:  04/28/2021 FINDINGS: CTA NECK FINDINGS Aortic arch: Atheromatous plaque. Right carotid system: No stenosis or ulceration. Left carotid system: No stenosis or ulceration. Vertebral arteries: No proximal subclavian stenosis. The vertebral arteries are smoothly contoured and widely patent to the dura. Skeleton: Cervical spine degeneration.  No acute finding Other neck: Negative Upper chest: Negative Review of the MIP images confirms the above findings CTA HEAD FINDINGS Anterior circulation: No major branch occlusion, beading, or flow limiting stenosis. Negative for aneurysm. Stable appearance of right MCA with mainstem serving numerous branches. More typical bifurcation branching pattern on the left. Posterior circulation: The vertebral and basilar arteries are smoothly contoured and widely patent. No branch occlusion, beading, or aneurysm Venous sinuses: Diffusely patent Anatomic variants: None significant Review of the MIP images confirms the above findings These results were called by telephone at the time of interpretation on 11/08/2021 at 5:36 am [facility], who verbally acknowledged these results. IMPRESSION: Negative CTA.  No large vessel occlusion or stenosis. Electronically Signed   By: JJorje GuildM.D.   On: 11/08/2021 05:37   CT HEAD CODE STROKE WO CONTRAST  Result Date: 11/08/2021 CLINICAL DATA:  Code stroke.  Weakness and confusion EXAM: CT HEAD WITHOUT CONTRAST TECHNIQUE: Contiguous axial images were obtained from the base of the skull through the vertex without intravenous contrast. RADIATION DOSE REDUCTION: This exam was performed according to the departmental  dose-optimization program which includes automated exposure control, adjustment of the mA and/or kV according to patient size and/or use of iterative reconstruction technique. COMPARISON:  04/28/2021 FINDINGS: Brain: No evidence of acute infarction, hemorrhage, hydrocephalus, extra-axial collection or mass lesion/mass effect. Vascular: Dense appearance of the right MCA bifurcation Skull: Normal. Negative for fracture or focal lesion. Sinuses/Orbits: No acute finding. Other: These results were called by telephone at the time of interpretation on 11/08/2021 at 5:21 am [facility], who verb[facility] acknowledged these results. ASPECTS ([facility]) - Ganglionic  level infarction (caudate, lentiform nuclei, internal capsule, insula, M1-M3 cortex): 7 - Supraganglionic infarction (M4-M6 cortex): 3 Total score (0-10 with 10 being normal): 10 IMPRESSION: 1. Dense right MCA bifurcation, CTA is pending. 2. Negative for hemorrhage.  ASPECTS is 10. Electronically Signed   By: Jorje Guild M.D.   On: 11/08/2021 05:22   DG Chest 2 View  Result Date: 11/07/2021 CLINICAL DATA:  Hiccups EXAM: CHEST - 2 VIEW COMPARISON:  Chest x-ray dated June 05, 2020; chest x-ray dated May 05, 2020 FINDINGS: The heart size and mediastinal contours are within normal limits. Elevation of the right hemidiaphragm, similar to prior exams, similar to prior exams. Both lungs are clear. The visualized skeletal structures are unremarkable. IMPRESSION: 1. Lungs are clear. 2. Elevation of the right hemidiaphragm. Electronically Signed   By: Yetta Glassman M.D.   On: 11/07/2021 10:41     PROCEDURES:  Critical Care performed: Yes, see critical care procedure note(s)   CRITICAL CARE Performed by: [name] Joven Mom   Total critical care time: 45 minutes  Critical care time was exclusive of separately billable procedures and treating other patients.  Critical care was necessary to treat or prevent  imminent or life-threatening deterioration.  Critical care was time spent personally by me on the following activities: development of treatment plan with patient and/or surrogate as well as nursing, discussions with consultants, evaluation of patient's response to treatment, examination of patient, obtaining history from patient or surrogate, ordering and performing treatments and interventions, ordering and review of laboratory studies, ordering and review of radiographic studies, pulse oximetry and re-evaluation of patient's condition.   Marland Kitchen1-3 Lead EKG Interpretation  Performed by: Andra Matsuo, Delice [name], DO Authorized by: Karolyne Timmons, Delice [name], DO     Interpretation: normal     ECG rate:  56   ECG rate assessment: bradycardic     Rhythm: sinus bradycardia     Ectopy: none     Conduction: normal       IMPRESSION / MDM / ASSESSMENT AND PLAN / ED COURSE  I reviewed the triage vital signs and the nursing notes.    Patient here with complaints of left-sided numbness and weakness that started at 4:20 PM when walking home from work.  Patient has history of previous CVA in 2021 at [facility] but also history of functional neurologic disorder.  The patient is on the cardiac monitor to evaluate for evidence of arrhythmia and/or significant heart rate changes.   DIFFERENTIAL DIAGNOSIS (includes but not limited to):   CVA, intracranial hemorrhage, TIA, functional neurologic disorder, malingering, substance abuse   Patient's presentation is most consistent with acute presentation with potential threat to life or bodily function.   PLAN: Code stroke activated in route.  Patient protecting airway and taken immediately for CT head and CTA head and neck.  Patient brought back into the room to be evaluated by the tele neurologist.   Blood pressure currently 188/102 on one arm, 203/112 on the other arm with a heart rate in the 50s.  Will give IV hydralazine.  We will hold beta-blockers given history of cocaine  abuse.      MEDICATIONS GIVEN IN ED: Medications  haloperidol lactate (HALDOL) injection 2 mg (2 mg Intravenous Patient Refused/Not Given 11/08/21 0633)  iohexol (OMNIPAQUE) 350 MG/ML injection 75 mL (75 mLs Intravenous Contrast Given 11/08/21 0518)  hydrALAZINE (APRESOLINE) injection 10 mg (10 mg Intravenous Given 11/08/21 0541)  LORazepam (ATIVAN) injection 1 mg (1 mg Intravenous Given 11/08/21 0622)  pantoprazole (PROTONIX) injection  40 mg (40 mg Intravenous Given 11/08/21 0620)     ED COURSE:  6:30 AM  Pt becoming increasingly aggressive with MRI technician, cursing.  History of similar behavior documented at [facility] and [place].  States he is having acid reflux.  Nursing staff went over to the MRI machine to give patient IV Protonix as well as medications to help with agitation, anxiety.  We will attempt to get MRI to evaluate for acute CVA.   CONSULTS:  Tele neuro consulted.  No TNK recommended due to complaints of recent rectal bleeding and reported history of a previous intracranial hemorrhage.  Given inconsistent exam I also suspect that this is more functional neurologic symptoms rather than a true CVA.  Neurologist agrees with obtaining MRI of the brain without contrast.   MIR brain reviewed and interpreted by myself and radiology.  No CVA.  Urine pending to rule out UTI as cause of recurrence of old stroke symptoms.  Signed out to oncoming EDP at [place]: Reviewed patient's last admission to [facility] on 06/26/2020 for similar symptoms and was "found to have recurrence of functional neurologic disorder with question of malingering".  During that admission, "patient repeatedly requested oxycodone for his chronic pain and expressed threats to staff."       FINAL CLINICAL IMPRESSION(S) / ED DIAGNOSES   Final diagnoses:  Stroke-like symptoms  Hypertension, unspecified type  Aggressive behavior     Rx / DC Orders   ED Discharge Orders      None        Note:  This document was prepared using Dragon voice recognition software and may include unintentional dictation errors.   Mikhael Hendriks, Delice [name], DO 11/08/21 [phone]

## 2021-11-08 NOTE — Assessment & Plan Note (Addendum)
Patient has history of stroke - On aspirin and Lipitor

## 2021-11-08 NOTE — ED Triage Notes (Signed)
Pt arrived via [facility] from outside of apartment where pt stated he was walking to work at approx 0420 this AM when his left leg became numb and gave way, causing pt to fall. Pt reported left sided weakness, numbness and facial droop more than pts normal deficit to left side. Pt A&O x4 on arrival, able to answer all questions appropriately. Pt with left sided facial droop, even sensation, no slurred speech, no drift and pt able to grip with left hand. Pt taken straight to CT with assigned nurse.

## 2021-11-08 NOTE — ED Notes (Signed)
Called to MRI. Patient reports severe gastric reflux and states he felt like he couldn't breathe while in MRI. Patient vehemently refuses to take IV Haldol but states he will take protonix and ativan. Patient using profanity at staff. States he needs a few minutes before getting back into MRI.

## 2021-11-08 NOTE — Consult Note (Signed)
Addendum from note of 11/07/21, in last paragraph of body of note (HPI), first sentence: The sentence should read: "Patient does not meet criteria for involuntary commitment at this time."

## 2021-11-08 NOTE — ED Notes (Signed)
Patient endorsing SI thoughts at this time. Pt states he was here yesterday for same. Cinda Quest, MD and Sam, Charge RN made aware.

## 2021-11-08 NOTE — ED Notes (Signed)
Patient currently on MRI table. Resp even, unlabored on RA. No distress noted at this time.

## 2021-11-08 NOTE — Progress Notes (Signed)
Pharmacy Antibiotic Note  Samuel Molina is a 61 y.o. male w/ PMH of HTN, stroke and functional left leg weakness admitted on 11/08/2021 with cellulitis.  Pharmacy has been consulted for vancomycin dosing.  Plan:  start vancomycin 2000 mg IV x 1 then 1500 mg IV every 12 hours Goal AUC 400-550. Expected AUC: 443.2 SCr used: 0.85 mg/dL Ke: 0.092 h-1, T1/2 7.5 h   Weight: 102 kg (224 lb 13.9 oz)  Temp (24hrs), Avg:98.3 F (36.8 C), Min:98.3 F (36.8 C), Max:98.3 F (36.8 C)  Recent Labs  Lab 11/07/21 0916 11/08/21 0514  WBC 8.2 8.3  CREATININE 0.99 0.85    Estimated Creatinine Clearance: 117.4 mL/min (by C-G formula based on SCr of 0.85 mg/dL).    Allergies  Allergen Reactions   Acetaminophen Itching, Nausea And Vomiting and Rash    Antimicrobials this admission: 07/18 vancomycin >>  07/18 ceftriaxone >>   Microbiology results: 07/18 BCx: pending 07/18 UCx: pending   Thank you for allowing pharmacy to be a part of this patient's care.  Dallie Piles 11/08/2021 9:39 AM

## 2021-11-08 NOTE — Assessment & Plan Note (Signed)
See above

## 2021-11-08 NOTE — Progress Notes (Signed)
Patient arrives to room 120 with suicide precautions 1:1 ordered.  Patient was seen and cleared by behavior medicine yesterday.  Per secure chat with admitting physician, suicide precaution order discontinued.  Will continue to monitor behaviors and follow up with behavior medicine if needed.

## 2021-11-08 NOTE — ED Notes (Signed)
Belongings include:  Ria Bush [facility]  tee shirt  Two shoes  Phone  Charger  Wallet Gray cloth  Denture case White socks Change

## 2021-11-08 NOTE — Progress Notes (Addendum)
Code stroke activated at [place], patient in [place]. Returned at [phone]. MRS 1 Dr. Ria Bush connected at [phone].

## 2021-11-08 NOTE — Consult Note (Signed)
[facility] TeleNeurology Consult Services   Patient Name:   Alberta, Cairns Date of Birth:   October 06, 1960 Identification Number:   MRN - [id number] Date of Service:   11/08/2021 05:21:27  Diagnosis:       R53.1 - Weakness  Impression:      61 y/o man with history of HTN, stroke and functional left leg weakness. Not a thrombolytic candidate as he admits to blood in stool in the last week, as well as history of ICH. Will need MRI of the brain to evaluate whether today's presentation represents a repeat ischemic infarct, or more likely another episode of left sided functional weakness.  Our recommendations are outlined below.  Recommendations:   Per facility request will defer further work up, management, and referrals to inpatient service, inclusive of inpatient neurology consult.  Sign Out:       Discussed with Emergency Department Provider    ------------------------------------------------------------------------------  Advanced Imaging: CTA Head and Neck Completed.  LVO:No  Patient doesn't meet criteria for emergent NIR consideration   Metrics: Last Known Well: 11/08/2021 04:20:00 [facility] Notification Time: 11/08/2021 05:21:27 Arrival Time: 11/08/2021 05:05:00 Stamp Time: 11/08/2021 05:21:27 Initial Response Time: 11/08/2021 05:22:46 Symptoms: left leg weakness. Initial patient interaction: 11/08/2021 05:25:06 NIHSS Assessment Completed: 11/08/2021 05:35:43 Patient is not a candidate for Thrombolytic. Thrombolytic Medical Decision: 11/08/2021 05:35:48 Patient was not deemed candidate for Thrombolytic because of following reasons: Current or Previous ICH. GI Bleeding (Within 21 Days).  CT head showed no acute hemorrhage or acute core infarct.  Primary Provider Notified of Diagnostic Impression and Management Plan on: 11/08/2021 05:44:16    ------------------------------------------------------------------------------  History  of Present Illness: Patient is a 61 year old Male.  Patient was brought by EMS for symptoms of left leg weakness. 61 y/o man with history of HTN, stroke as well as multiple evaluations for functional left sided weakness. Presenting today again with left leg numbness and weakness while going to work today. States that he had sudden onset of left leg numbness and weakness at around 0420 today.   Past Medical History:      Hypertension      Stroke  Medications:  No Anticoagulant use  Antiplatelet use: Yes ASA 81 Reviewed EMR for current medications  Allergies:  Reviewed  Social History: Drug Use: No  Family History:  There is no family history of premature cerebrovascular disease pertinent to this consultation  ROS : 14 Points Review of Systems was performed and was negative except mentioned in HPI.  Past Surgical History: There Is No Surgical History Contributory To Today's Visit    Examination: BP(188/102), Pulse(56), 1A: Level of Consciousness - Alert; keenly responsive + 0 1B: Ask Month and Age - Both Questions Right + 0 1C: Blink Eyes & Squeeze Hands - Performs Both Tasks + 0 2: Test Horizontal Extraocular Movements - Normal + 0 3: Test Visual Fields - No Visual Loss + 0 4: Test Facial Palsy (Use Grimace if Obtunded) - Minor paralysis (flat nasolabial fold, smile asymmetry) + 1 5A: Test Left Arm Motor Drift - Drift, but doesn't hit bed + 1 5B: Test Right Arm Motor Drift - No Drift for 10 Seconds + 0 6A: Test Left Leg Motor Drift - Some Effort Against Gravity + 2 6B: Test Right Leg Motor Drift - No Drift for 5 Seconds + 0 7: Test Limb Ataxia (FNF/Heel-Shin) - No Ataxia + 0 8: Test Sensation - Mild-Moderate Loss: Can Sense Being Touched + 1 9: Test Language/Aphasia - Normal; No  aphasia + 0 10: Test Dysarthria - Mild-Moderate Dysarthria: Slurring but can be understood + 1 11: Test Extinction/Inattention - No abnormality + 0  NIHSS Score: 6   Pre-Morbid Modified  Rankin Scale: 1 Points = No significant disability despite symptoms; able to carry out all usual duties and activities   Patient/Family was informed the Neurology Consult would occur via TeleHealth consult by way of interactive audio and video telecommunications and consented to receiving care in this manner.   Patient is being evaluated for possible acute neurologic impairment and high probability of imminent or life-threatening deterioration. I spent total of 28 minutes providing care to this patient, including time for face to face visit via telemedicine, review of medical records, imaging studies and discussion of findings with providers, the patient and/or family.   Dr Shari Prows   [facility] For Inpatient follow-up with [facility] physician please call [facility] [phone]. This is not an outpatient service. Post hospital discharge, please contact hospital directly.

## 2021-11-08 NOTE — ED Provider Notes (Signed)
Patient's MRI is negative.  He reports his symptoms are essentially gone now.  Possibly he had a TIA.  Also he had an I&D of an abscess on the back of his left leg yesterday he was unable to get his antibiotics yesterday because [facility] did not have any and they told him to come back but he never did go back.  Now he has an area about 6 cm in diameter that is red and firm and tender.  Looks like he is developing cellulitis in the area.  I will see if we can get him in the hospital to a treat the cellulitis and be further evaluate this apparent TIA.  Additionally patient says he is feeling suicidal again.   Nena Polio, MD 11/08/21 0800

## 2021-11-08 NOTE — ED Notes (Signed)
Teleneurology exam activated

## 2021-11-08 NOTE — Assessment & Plan Note (Addendum)
Blood pressure remained mildly elevated. Blood pressure 203/112 on admission.  This is due to medication noncompliance.  Patient is not taking blood pressure medications currently. -Increase the dose of amlodipine to 10 mg -Continue lisinopril -IV hydralazine as needed

## 2021-11-08 NOTE — ED Notes (Signed)
Pt is wanting to pull out the IV in his L hand.  Pt stated he is going to pull that line out after the medicines finished

## 2021-11-08 NOTE — ED Notes (Signed)
Patient given ginger ale as requested.

## 2021-11-08 NOTE — Assessment & Plan Note (Addendum)
-   UDS done on 7/21 was negative. -Counseling was provided

## 2021-11-08 NOTE — Consult Note (Signed)
[facility] Nurse Consult Note: Patient receiving care in [facility] ED30. Reason for Consult: "left leg abscess and s/p of I&D" Wound type: infectious I&D site.  See photos from today. Pressure Injury POA: Yes/No/NA Measurement: Wound bed: Drainage (amount, consistency, odor) sanginous Periwound: erythematous, TTP Dressing procedure/placement/frequency: Remove the packing from the abscess I&D site of the left thigh. Replace it with a segment of Iodoform packing strip Kellie Simmering [phone], or Kellie Simmering [phone]). Leave a piece hanging out to ease removal. Cover with dry gauze, tape in place.  Of note, patient is with SI intermittently, also refusing care, threatening to remove IV. Patient is known to become violent per record. [facility] nurse will not follow at this time.  Please re-consult the [facility] team if needed.  Val Riles, RN, MSN, CWOCN, CNS-BC, pager [phone]

## 2021-11-08 NOTE — ED Notes (Signed)
[facility] initiated  Code Stroke/ Care Link activated page @ [phone]

## 2021-11-08 NOTE — Assessment & Plan Note (Addendum)
Seen by psychiatrist-stable - Continue Celexa -Continue Seroquel 100 mg daily -Currently denies homicidal and homicidal ideations -No need for IVC or inpatient psych admission per psychiatry

## 2021-11-08 NOTE — Assessment & Plan Note (Addendum)
Etiology is not clear.  MRI of brain is negative for new stroke.  CTA negative for LVO.  Differential diagnosis include TIA versus malingering. -Aspirin, Lipitor

## 2021-11-08 NOTE — Progress Notes (Signed)
Admission profile updated. ?

## 2021-11-08 NOTE — Progress Notes (Signed)
Pt has been verbally aggressive and was mad about his wound being open. RN tried to cover the wound on his L leg but pt refused (packing was seen on the wound), wants wound nurse to do it. Pt refused Telemetry, MD made-aware.

## 2021-11-08 NOTE — Assessment & Plan Note (Addendum)
-   Denies any suicidal ideation at this time

## 2021-11-08 NOTE — Assessment & Plan Note (Addendum)
S/p of I&D by ED physician.  Patient does not have fever and chills.  Does not meets criteria for sepsis. Wound cultures with MRSA, sensitive to clindamycin, tetracycline and vancomycin. General surgery was also consulted due to worsening pain but there was no indication for another I&D. -Continue with wound care

## 2021-11-08 NOTE — Assessment & Plan Note (Signed)
Lipitor 

## 2021-11-08 NOTE — ED Notes (Signed)
Pt states he does not want the cords and wires on him

## 2021-11-08 NOTE — H&P (Signed)
History and Physical    Samuel Molina RSW:[id number] DOB: September 29, 1960 DOA: 11/08/2021  Referring MD/NP/PA:   PCP: Pcp, No   Patient coming from:  The patient is coming from home.    Chief Complaint: Left-sided weakness, numbness, suicidal ideation, left leg pain due to abscess  HPI: Samuel Molina is a 61 y.o. male with medical history significant of hypertension, hyperlipidemia, stroke, TIA, GERD, multiple psych issue (depression, anxiety, personal disorder, malingering, adjustment disorder), tobacco abuse, cocaine abuse, Bell's palsy, who presents with left-sided weakness, numbness, suicidal ideation, left leg pain due to abscess.  Patient was seen yesterday in ED due to suicidal ideation.  Psychiatry NP, Joesphine Bare was consulted and evaluate the patient.  Patient was cleared to be discharged home since his suicidal ideation resolved.  This morning, patient came back to hospital due to left-sided numbness and weakness.  When I saw patient in ED, his symptoms have resolved.  Currently patient does not have unilateral numbness or tinglings in extremities.  No facial droop or slurred speech.  Patient also had suicidal ideation this morning which has resolved.  He told me that he feels better, no suicidal or homicidal ideations.  No hallucination.  He was seen here yesterday for an abscess to the posterior left lower extremity that he states was from a "spider bite".  I&D was performed by EDP.  Patient was given prescription of amoxicillin and doxycycline, but he did not pick up the medication. Patient has leg pain due to abscess, which is constant, sharp, severe, nonradiating.  Denies fever or chills. Patient does not have chest pain, cough, shortness breath, nausea, vomiting, diarrhea or abdominal pain.  Denies symptoms of UTI.  Data reviewed independently and ED Course: pt was found to have WBC 8.3, INR 1.0, UDS positive for cocaine yesterday, alcohol level less than 10, electrolytes  renal function okay, temperature normal, elevated blood pressure 2 3/112, heart rate 50-70s, RR 18, oxygen saturation 100% on room air.  Chest x-ray negative for infiltration.  CT of head showed dense right MCA bifurcation without hemorrhage.  MRI of brain is negative for stroke.  CTA of head and neck is negative for LVO.  Patient is placed on telemetry bed for observation.  Psychiatry NP, Waldon Merl is consulted   EKG: I have personally reviewed.  Sinus rhythm, QTc 476, LAD, T wave inversion in inferior leads and V5-V6.   Review of Systems:   General: no fevers, chills, no body weight gain, has fatigue HEENT: no blurry vision, hearing changes or sore throat Respiratory: no dyspnea, coughing, wheezing CV: no chest pain, no palpitations GI: no nausea, vomiting, abdominal pain, diarrhea, constipation GU: no dysuria, burning on urination, increased urinary frequency, hematuria  Ext: no leg edema Neuro: no vision change or hearing loss. Has left-sided weakness and numbness. Skin: Has a draining abscess in left lower extremity MSK: No muscle spasm, no deformity, no limitation of range of movement in spin Heme: No easy bruising.  Travel history: No recent long distant travel. Psychiatry: Had suicidal ideation   Allergy:  Allergies  Allergen Reactions   Acetaminophen Itching, Nausea And Vomiting and Rash    Past Medical History:  Diagnosis Date   Anxiety    Depression    GERD (gastroesophageal reflux disease)    Obesity    Stroke [facility])    Substance abuse ([place])     Past Surgical History:  Procedure Laterality Date   ESOPHAGOGASTRODUODENOSCOPY (EGD) WITH PROPOFOL N/A 09/20/2017   Procedure: ESOPHAGOGASTRODUODENOSCOPY (EGD) WITH  PROPOFOL;  Surgeon: Lin Landsman, MD;  Location: [facility] ENDOSCOPY;  Service: Gastroenterology;  Laterality: N/A;   KNEE SURGERY Right     Social History:  reports that he has been smoking cigarettes. He has been smoking an average of .5 packs per  day. He has never used smokeless tobacco. He reports that he does not currently use alcohol. He reports current drug use. Drug: Cocaine.  Family History:  Family History  Problem Relation Age of Onset   Vasculitis Mother      Prior to Admission medications   Medication Sig Start Date End Date Taking? Authorizing Provider  amoxicillin-clavulanate (AUGMENTIN) 875-125 MG tablet Take 1 tablet by mouth every 12 (twelve) hours. Patient not taking: Reported on 12/17/2020 11/11/20   [name], MD  aspirin EC 81 MG tablet Take 81 mg by mouth daily. Swallow whole. Patient not taking: Reported on 11/08/2021    [provider]  atorvastatin (LIPITOR) 80 MG tablet Take 80 mg by mouth every evening. Patient not taking: Reported on 11/08/2021    [provider]  citalopram (CELEXA) 10 MG tablet Take 1 tablet (10 mg total) by mouth daily. Patient not taking: Reported on 11/08/2021 11/12/20   [name], MD  diclofenac Sodium (VOLTAREN) 1 % GEL Apply 4 g topically 4 (four) times daily. Patient not taking: Reported on 11/08/2021    [provider]  doxycycline (ADOXA) 100 MG tablet Take 1 tablet (100 mg total) by mouth 2 (two) times daily. Patient not taking: Reported on 11/08/2021 11/07/21   Nena Polio, MD  famotidine (PEPCID) 20 MG tablet Take 20 mg by mouth 2 (two) times daily. Patient not taking: Reported on 11/08/2021    [provider]  gabapentin (NEURONTIN) 300 MG capsule Take 1 capsule (300 mg total) by mouth 3 (three) times daily. 10/01/21 10/31/21  Menshew, Dannielle Karvonen, PA-C  gabapentin (NEURONTIN) 400 MG capsule Take 1 capsule (400 mg total) by mouth 3 (three) times daily. Patient not taking: Reported on 11/08/2021 11/11/20   [name], MD  lisinopril (ZESTRIL) 20 MG tablet Take 1 tablet (20 mg total) by mouth daily. Patient not taking: Reported on 11/08/2021 11/12/20   [name], MD  naproxen (NAPROSYN) 500 MG tablet Take 1 tablet (500 mg  total) by mouth 2 (two) times daily with a meal. Patient not taking: Reported on 11/08/2021 10/01/21   Carrie Mew, MD  QUEtiapine (SEROQUEL) 300 MG tablet Take 1 tablet (300 mg total) by mouth at bedtime. Patient not taking: Reported on 11/08/2021 11/11/20   [name], MD  omeprazole (PRILOSEC OTC) 20 MG tablet Take 2 tablets (40 mg total) by mouth 2 (two) times daily before a meal. 09/19/17 01/29/19  Lin Landsman, MD    Physical Exam: Vitals:   11/08/21 1021 11/08/21 1300 11/08/21 1543 11/08/21 1809  BP: (!) 133/53 (!) 119/50 (!) 110/54 (!) 100/51  Pulse:  61 (!) 53 (!) 59  Resp: _0 Temp: (!) 96.8 F (36 C) (!) 97.5 F (36.4 C) 98.7 F (37.1 C) 98 F (36.7 C)  TempSrc: Oral Axillary Axillary   SpO2:  100% 99% 100%  Weight:       General: Not in acute distress HEENT:       Eyes: PERRL, EOMI, no scleral icterus.       ENT: No discharge from the ears and nose, no pharynx injection, no tonsillar enlargement.  Neck: No JVD, no bruit, no mass felt. Heme: No neck lymph node enlargement. Cardiac: S1/S2, RRR, No murmurs, No gallops or rubs. Respiratory: No rales, wheezing, rhonchi or rubs. GI: Soft, nondistended, nontender, no rebound pain, no organomegaly, BS present. GU: No hematuria Ext: No pitting leg edema bilaterally. 1+DP/PT pulse bilaterally. Musculoskeletal: No joint deformities, No joint redness or warmth, no limitation of ROM in spin. Skin: Has a draining abscess in left lower extremity with surrounding erythema and tenderness   Neuro: Alert, oriented X3, cranial nerves II-XII grossly intact, moves all extremities normally. Muscle strength 5/5 in all extremities, sensation to light touch intact.  Psych: Patient had suicidal ideation earlier which has resolved.  Currently denies suicidal homicidal ideations.  No hallucination.  Labs on Admission: I have personally reviewed following labs and imaging studies  CBC: Recent Labs  Lab  11/07/21 0916 11/08/21 0514  WBC 8.2 8.3  NEUTROABS  --  4.1  HGB 14.3 13.8  HCT 46.9 44.7  MCV 83.9 83.4  PLT 323 532   Basic Metabolic Panel: Recent Labs  Lab 11/07/21 0916 11/08/21 0514  NA 141 142  K 3.5 3.9  CL 111 108  CO2 24 27  GLUCOSE 152* 87  BUN 9 10  CREATININE 0.99 0.85  CALCIUM 9.3 9.3   GFR: Estimated Creatinine Clearance: 117.4 mL/min (by C-G formula based on SCr of 0.85 mg/dL). Liver Function Tests: Recent Labs  Lab 11/07/21 0916 11/08/21 0514  AST 23 21  ALT 14 15  ALKPHOS 91 84  BILITOT 0.2* 0.4  PROT 7.3 7.6  ALBUMIN 3.5 3.8   No results for input(s): "LIPASE", "AMYLASE" in the last 168 hours. No results for input(s): "AMMONIA" in the last 168 hours. Coagulation Profile: Recent Labs  Lab 11/08/21 0514  INR 1.0   Cardiac Enzymes: No results for input(s): "CKTOTAL", "CKMB", "CKMBINDEX", "TROPONINI" in the last 168 hours. BNP (last 3 results) No results for input(s): "PROBNP" in the last 8760 hours. HbA1C: No results for input(s): "HGBA1C" in the last 72 hours. CBG: Recent Labs  Lab 11/08/21 0507  GLUCAP 82   Lipid Profile: No results for input(s): "CHOL", "HDL", "LDLCALC", "TRIG", "CHOLHDL", "LDLDIRECT" in the last 72 hours. Thyroid Function Tests: No results for input(s): "TSH", "T4TOTAL", "FREET4", "T3FREE", "THYROIDAB" in the last 72 hours. Anemia Panel: No results for input(s): "VITAMINB12", "FOLATE", "FERRITIN", "TIBC", "IRON", "RETICCTPCT" in the last 72 hours. Urine analysis:    Component Value Date/Time   COLORURINE YELLOW (A) 11/08/2021 0514   APPEARANCEUR CLEAR (A) 11/08/2021 0514   LABSPEC 1.043 (H) 11/08/2021 0514   PHURINE 7.0 11/08/2021 [place] 11/08/2021 0514   HGBUR NEGATIVE 11/08/2021 0514   BILIRUBINUR NEGATIVE 11/08/2021 [place] 11/08/2021 0514   PROTEINUR NEGATIVE 11/08/2021 0514   NITRITE NEGATIVE 11/08/2021 0514   LEUKOCYTESUR NEGATIVE 11/08/2021 0514   Sepsis  Labs: _0 (procalcitonin:4,lacticidven:4) )No results found for this or any previous visit (from the past 240 hour(s)).   Radiological Exams on Admission: MR BRAIN WO CONTRAST  Result Date: 11/08/2021 CLINICAL DATA:  Left-sided weakness with slurred speech. EXAM: MRI HEAD WITHOUT CONTRAST TECHNIQUE: Multiplanar, multiecho pulse sequences of the brain and surrounding structures were obtained without intravenous contrast. COMPARISON:  04/28/2021 FINDINGS: Brain: No acute infarction, hemorrhage, hydrocephalus, extra-axial collection or mass effect. Subcentimeter lipoma posterior to the left inferior colliculus. Vascular: Major flow voids are preserved.  There was preceding CTA. Skull and upper cervical spine: No focal marrow lesion Sinuses/Orbits: Negative IMPRESSION: Stable  motion degraded brain MRI.  No acute infarct. Electronically Signed   By: Jorje Guild M.D.   On: 11/08/2021 07:15   CT ANGIO HEAD NECK W WO CM  Result Date: 11/08/2021 CLINICAL DATA:  Stroke follow-up EXAM: CT ANGIOGRAPHY HEAD AND NECK TECHNIQUE: Multidetector CT imaging of the head and neck was performed using the standard protocol during bolus administration of intravenous contrast. Multiplanar CT image reconstructions and MIPs were obtained to evaluate the vascular anatomy. Carotid stenosis measurements (when applicable) are obtained utilizing NASCET criteria, using the distal internal carotid diameter as the denominator. RADIATION DOSE REDUCTION: This exam was performed according to the departmental dose-optimization program which includes automated exposure control, adjustment of the mA and/or kV according to patient size and/or use of iterative reconstruction technique. CONTRAST:  58m OMNIPAQUE IOHEXOL 350 MG/ML SOLN COMPARISON:  04/28/2021 FINDINGS: CTA NECK FINDINGS Aortic arch: Atheromatous plaque. Right carotid system: No stenosis or ulceration. Left carotid system: No stenosis or ulceration. Vertebral arteries: No  proximal subclavian stenosis. The vertebral arteries are smoothly contoured and widely patent to the dura. Skeleton: Cervical spine degeneration.  No acute finding Other neck: Negative Upper chest: Negative Review of the MIP images confirms the above findings CTA HEAD FINDINGS Anterior circulation: No major branch occlusion, beading, or flow limiting stenosis. Negative for aneurysm. Stable appearance of right MCA with mainstem serving numerous branches. More typical bifurcation branching pattern on the left. Posterior circulation: The vertebral and basilar arteries are smoothly contoured and widely patent. No branch occlusion, beading, or aneurysm Venous sinuses: Diffusely patent Anatomic variants: None significant Review of the MIP images confirms the above findings These results were called by telephone at the time of interpretation on 11/08/2021 at 5:36 am to provider [facility], who verbally acknowledged these results. IMPRESSION: Negative CTA.  No large vessel occlusion or stenosis. Electronically Signed   By: JJorje GuildM.D.   On: 11/08/2021 05:37   CT HEAD CODE STROKE WO CONTRAST  Result Date: 11/08/2021 CLINICAL DATA:  Code stroke.  Weakness and confusion EXAM: CT HEAD WITHOUT CONTRAST TECHNIQUE: Contiguous axial images were obtained from the base of the skull through the vertex without intravenous contrast. RADIATION DOSE REDUCTION: This exam was performed according to the departmental dose-optimization program which includes automated exposure control, adjustment of the mA and/or kV according to patient size and/or use of iterative reconstruction technique. COMPARISON:  04/28/2021 FINDINGS: Brain: No evidence of acute infarction, hemorrhage, hydrocephalus, extra-axial collection or mass lesion/mass effect. Vascular: Dense appearance of the right MCA bifurcation Skull: Normal. Negative for fracture or focal lesion. Sinuses/Orbits: No acute finding. Other: These results were called by telephone at  the time of interpretation on 11/08/2021 at 5:21 am to provider [facility], who verbally acknowledged these results. ASPECTS ([facility] Program Early CT Score) - Ganglionic level infarction (caudate, lentiform nuclei, internal capsule, insula, M1-M3 cortex): 7 - Supraganglionic infarction (M4-M6 cortex): 3 Total score (0-10 with 10 being normal): 10 IMPRESSION: 1. Dense right MCA bifurcation, CTA is pending. 2. Negative for hemorrhage.  ASPECTS is 10. Electronically Signed   By: JJorje GuildM.D.   On: 11/08/2021 05:22   DG Chest 2 View  Result Date: 11/07/2021 CLINICAL DATA:  Hiccups EXAM: CHEST - 2 VIEW COMPARISON:  Chest x-ray dated June 05, 2020; chest x-ray dated May 05, 2020 FINDINGS: The heart size and mediastinal contours are within normal limits. Elevation of the right hemidiaphragm, similar to prior exams, similar to prior exams. Both lungs are clear. The  visualized skeletal structures are unremarkable. IMPRESSION: 1. Lungs are clear. 2. Elevation of the right hemidiaphragm. Electronically Signed   By: Yetta Glassman M.D.   On: 11/07/2021 10:41      Assessment/Plan Principal Problem:   Cellulitis and abscess of left leg Active Problems:   Left-sided weakness   Stroke ([place])   Hypertensive urgency   HLD (hyperlipidemia)   Adjustment disorder with mixed disturbance of emotions and conduct   Personality disorder ([place])   Depression with anxiety   Suicidal ideation   Cocaine abuse ([place])   Tobacco abuse   Assessment and Plan: * Cellulitis and abscess of left leg S/p of I&D by ED physician.  Patient does not have fever and chills.  Does not meets criteria for sepsis.  - will place on tele bed for obs - Empiric antimicrobial treatment with vancomycin, Rocephin - Blood cultures x 2  - ESR and CRP - wound care consult -Pain control: As needed ibuprofen and IV ketorolac, avoid narcotic medications   Left-sided weakness Etiology is not clear.  MRI of brain is  negative for new stroke.  CTA negative for LVO.  Differential diagnosis include TIA versus malingering. -Aspirin, Lipitor -Check A1c, FLP  Stroke ([facility]) - On aspirin and Lipitor  Hypertensive urgency Blood pressure 203/112.  This is due to medication noncompliance.  Patient is not taking blood pressure medications currently. -Start home lisinopril 20 mg daily -IV hydralazine as needed  HLD (hyperlipidemia) - Lipitor  Adjustment disorder with mixed disturbance of emotions and conduct - Continue Celexa -Restart Seroquel 100 mg daily -Consulted psychiatry NP, Waldon Merl -Currently denies homicidal and homicidal ideations  Personality disorder ([place]) - See above  Depression with anxiety - See above  Suicidal ideation - Suicidal ideation has resolved  Cocaine abuse ([place]) - Data counseling about importance of quitting cocaine  Tobacco abuse - Nicotine patch          DVT ppx: SQ Lovenox  Code Status: Full code  Family Communication: not done, no family member is at bed side.   Disposition Plan:  Anticipate discharge back to previous environment  Consults called:  Psychiatry NP, Waldon Merl is consulted  Admission status and Level of care: Telemetry Medical:    for obs     Severity of Illness:  The appropriate patient status for this patient is OBSERVATION. Observation status is judged to be reasonable and necessary in order to provide the required intensity of service to ensure the patient's safety. The patient's presenting symptoms, physical exam findings, and initial radiographic and laboratory data in the context of their medical condition is felt to place them at decreased risk for further clinical deterioration. Furthermore, it is anticipated that the patient will be medically stable for discharge from the hospital within 2 midnights of admission.        Date of Service 11/08/2021    [facility]   If 7PM-7AM, please  contact night-coverage [email or web address] 11/08/2021, 6:12 PM

## 2021-11-08 NOTE — Assessment & Plan Note (Signed)
-  Nicotine patch 

## 2021-11-09 ENCOUNTER — Inpatient Hospital Stay: Admit: 2021-11-09 | Payer: 59

## 2021-11-09 DIAGNOSIS — L02416 Cutaneous abscess of left lower limb: Secondary | ICD-10-CM | POA: Diagnosis not present

## 2021-11-09 DIAGNOSIS — K625 Hemorrhage of anus and rectum: Secondary | ICD-10-CM | POA: Diagnosis not present

## 2021-11-09 DIAGNOSIS — Z1624 Resistance to multiple antibiotics: Secondary | ICD-10-CM | POA: Diagnosis not present

## 2021-11-09 DIAGNOSIS — F418 Other specified anxiety disorders: Secondary | ICD-10-CM | POA: Diagnosis not present

## 2021-11-09 DIAGNOSIS — T63301A Toxic effect of unspecified spider venom, accidental (unintentional), initial encounter: Secondary | ICD-10-CM | POA: Diagnosis not present

## 2021-11-09 DIAGNOSIS — B955 Unspecified streptococcus as the cause of diseases classified elsewhere: Secondary | ICD-10-CM | POA: Diagnosis not present

## 2021-11-09 DIAGNOSIS — Z79899 Other long term (current) drug therapy: Secondary | ICD-10-CM | POA: Diagnosis not present

## 2021-11-09 DIAGNOSIS — I639 Cerebral infarction, unspecified: Secondary | ICD-10-CM | POA: Diagnosis not present

## 2021-11-09 DIAGNOSIS — F17210 Nicotine dependence, cigarettes, uncomplicated: Secondary | ICD-10-CM | POA: Diagnosis not present

## 2021-11-09 DIAGNOSIS — K5730 Diverticulosis of large intestine without perforation or abscess without bleeding: Secondary | ICD-10-CM | POA: Diagnosis not present

## 2021-11-09 DIAGNOSIS — L089 Local infection of the skin and subcutaneous tissue, unspecified: Secondary | ICD-10-CM | POA: Diagnosis not present

## 2021-11-09 DIAGNOSIS — R29810 Facial weakness: Secondary | ICD-10-CM | POA: Diagnosis not present

## 2021-11-09 DIAGNOSIS — Z720 Tobacco use: Secondary | ICD-10-CM | POA: Diagnosis not present

## 2021-11-09 DIAGNOSIS — I161 Hypertensive emergency: Secondary | ICD-10-CM | POA: Diagnosis not present

## 2021-11-09 DIAGNOSIS — K640 First degree hemorrhoids: Secondary | ICD-10-CM | POA: Diagnosis not present

## 2021-11-09 DIAGNOSIS — R7881 Bacteremia: Secondary | ICD-10-CM

## 2021-11-09 DIAGNOSIS — R1319 Other dysphagia: Secondary | ICD-10-CM | POA: Diagnosis not present

## 2021-11-09 DIAGNOSIS — M25561 Pain in right knee: Secondary | ICD-10-CM | POA: Diagnosis not present

## 2021-11-09 DIAGNOSIS — K2901 Acute gastritis with bleeding: Secondary | ICD-10-CM | POA: Diagnosis not present

## 2021-11-09 DIAGNOSIS — R4689 Other symptoms and signs involving appearance and behavior: Secondary | ICD-10-CM | POA: Diagnosis not present

## 2021-11-09 DIAGNOSIS — F1910 Other psychoactive substance abuse, uncomplicated: Secondary | ICD-10-CM | POA: Diagnosis not present

## 2021-11-09 DIAGNOSIS — E785 Hyperlipidemia, unspecified: Secondary | ICD-10-CM | POA: Diagnosis not present

## 2021-11-09 DIAGNOSIS — L03116 Cellulitis of left lower limb: Secondary | ICD-10-CM | POA: Diagnosis not present

## 2021-11-09 DIAGNOSIS — I361 Nonrheumatic tricuspid (valve) insufficiency: Secondary | ICD-10-CM | POA: Diagnosis not present

## 2021-11-09 DIAGNOSIS — R1013 Epigastric pain: Secondary | ICD-10-CM | POA: Diagnosis not present

## 2021-11-09 DIAGNOSIS — F4325 Adjustment disorder with mixed disturbance of emotions and conduct: Secondary | ICD-10-CM | POA: Diagnosis not present

## 2021-11-09 DIAGNOSIS — K635 Polyp of colon: Secondary | ICD-10-CM | POA: Diagnosis not present

## 2021-11-09 DIAGNOSIS — A4902 Methicillin resistant Staphylococcus aureus infection, unspecified site: Secondary | ICD-10-CM | POA: Diagnosis not present

## 2021-11-09 DIAGNOSIS — B9562 Methicillin resistant Staphylococcus aureus infection as the cause of diseases classified elsewhere: Secondary | ICD-10-CM | POA: Diagnosis not present

## 2021-11-09 DIAGNOSIS — K2100 Gastro-esophageal reflux disease with esophagitis, without bleeding: Secondary | ICD-10-CM | POA: Diagnosis not present

## 2021-11-09 DIAGNOSIS — W19XXXA Unspecified fall, initial encounter: Secondary | ICD-10-CM | POA: Diagnosis not present

## 2021-11-09 DIAGNOSIS — D123 Benign neoplasm of transverse colon: Secondary | ICD-10-CM | POA: Diagnosis not present

## 2021-11-09 DIAGNOSIS — B9561 Methicillin susceptible Staphylococcus aureus infection as the cause of diseases classified elsewhere: Secondary | ICD-10-CM | POA: Diagnosis not present

## 2021-11-09 DIAGNOSIS — R109 Unspecified abdominal pain: Secondary | ICD-10-CM | POA: Diagnosis not present

## 2021-11-09 DIAGNOSIS — F419 Anxiety disorder, unspecified: Secondary | ICD-10-CM | POA: Diagnosis not present

## 2021-11-09 DIAGNOSIS — R45851 Suicidal ideations: Secondary | ICD-10-CM | POA: Diagnosis present

## 2021-11-09 DIAGNOSIS — F1410 Cocaine abuse, uncomplicated: Secondary | ICD-10-CM | POA: Diagnosis present

## 2021-11-09 DIAGNOSIS — E669 Obesity, unspecified: Secondary | ICD-10-CM | POA: Diagnosis not present

## 2021-11-09 DIAGNOSIS — K2970 Gastritis, unspecified, without bleeding: Secondary | ICD-10-CM | POA: Diagnosis not present

## 2021-11-09 DIAGNOSIS — R2990 Unspecified symptoms and signs involving the nervous system: Secondary | ICD-10-CM | POA: Diagnosis not present

## 2021-11-09 DIAGNOSIS — F32A Depression, unspecified: Secondary | ICD-10-CM | POA: Diagnosis present

## 2021-11-09 DIAGNOSIS — R69 Illness, unspecified: Secondary | ICD-10-CM | POA: Diagnosis not present

## 2021-11-09 DIAGNOSIS — R252 Cramp and spasm: Secondary | ICD-10-CM | POA: Diagnosis not present

## 2021-11-09 DIAGNOSIS — I340 Nonrheumatic mitral (valve) insufficiency: Secondary | ICD-10-CM | POA: Diagnosis not present

## 2021-11-09 DIAGNOSIS — I69354 Hemiplegia and hemiparesis following cerebral infarction affecting left non-dominant side: Secondary | ICD-10-CM | POA: Diagnosis not present

## 2021-11-09 DIAGNOSIS — I10 Essential (primary) hypertension: Secondary | ICD-10-CM | POA: Diagnosis not present

## 2021-11-09 LAB — BLOOD CULTURE ID PANEL (REFLEXED) - BCID2
A.calcoaceticus-baumannii: NOT DETECTED
Bacteroides fragilis: NOT DETECTED
Candida albicans: NOT DETECTED
Candida auris: NOT DETECTED
Candida glabrata: NOT DETECTED
Candida krusei: NOT DETECTED
Candida parapsilosis: NOT DETECTED
Candida tropicalis: NOT DETECTED
Cryptococcus neoformans/gattii: NOT DETECTED
Enterobacter cloacae complex: NOT DETECTED
Enterobacterales: NOT DETECTED
Enterococcus Faecium: NOT DETECTED
Enterococcus faecalis: NOT DETECTED
Escherichia coli: NOT DETECTED
Haemophilus influenzae: NOT DETECTED
Klebsiella aerogenes: NOT DETECTED
Klebsiella oxytoca: NOT DETECTED
Klebsiella pneumoniae: NOT DETECTED
Listeria monocytogenes: NOT DETECTED
Meth resistant mecA/C and MREJ: DETECTED — AB
Methicillin resistance mecA/C: DETECTED — AB
Neisseria meningitidis: NOT DETECTED
Proteus species: NOT DETECTED
Pseudomonas aeruginosa: NOT DETECTED
Salmonella species: NOT DETECTED
Serratia marcescens: NOT DETECTED
Staphylococcus aureus (BCID): DETECTED — AB
Staphylococcus epidermidis: DETECTED — AB
Staphylococcus lugdunensis: NOT DETECTED
Staphylococcus species: DETECTED — AB
Stenotrophomonas maltophilia: NOT DETECTED
Streptococcus agalactiae: NOT DETECTED
Streptococcus pneumoniae: NOT DETECTED
Streptococcus pyogenes: NOT DETECTED
Streptococcus species: DETECTED — AB

## 2021-11-09 LAB — CREATININE, SERUM
Creatinine, Ser: 0.96 mg/dL (ref 0.61–1.24)
GFR, Estimated: >60 mL/min (ref >60)

## 2021-11-09 LAB — CBC
HCT: 45 % (ref 39.0–52.0)
Hemoglobin: 14 g/dL (ref 13.0–17.0)
MCH: 26.2 pg (ref 26.0–34.0)
MCHC: 31.1 g/dL (ref 30.0–36.0)
MCV: 84.3 fL (ref 80.0–100.0)
Platelets: 291 10*3/uL (ref 150–400)
RBC: 5.34 MIL/uL (ref 4.22–5.81)
RDW: 14.4 % (ref 11.5–15.5)
WBC: 7.3 10*3/uL (ref 4.0–10.5)
nRBC: 0 % (ref 0.0–0.2)

## 2021-11-09 MED ORDER — OFLOXACIN 0.3 % OP SOLN
1.0000 [drp] | Freq: Four times a day (QID) | OPHTHALMIC | Status: DC
  Administered 2021-11-09 – 2021-11-12 (×8): 1 [drp] via OPHTHALMIC
  Filled 2021-11-09 (×2): qty 5

## 2021-11-09 MED ORDER — OFLOXACIN 0.3 % OP SOLN
1.0000 [drp] | Freq: Four times a day (QID) | OPHTHALMIC | Status: DC
Start: 2021-11-09 — End: 2021-11-09
  Filled 2021-11-09: qty 5

## 2021-11-09 NOTE — Progress Notes (Signed)
Pt keep refusing cardiac monitoring, pt educated about it but pt said he is alright

## 2021-11-09 NOTE — Progress Notes (Signed)
PROGRESS NOTE Samuel Molina  ZMO:[id number] DOB: [birth year] DOA: 11/08/2021 PCP: Pcp, No   Brief Narrative/Hospital Course: 64M, hx of HTN, HLD, TIA, GERD, multiple psych issue (depression, anxiety, personal disorder, malingering, adjustment disorder), tobacco abuse, cocaine abuse, Bell's palsy, presented with multiple complaints including left-sided weakness numbness, suicidal ideation and left leg wound. Patient seen in the ED underwent lab work essentially unremarkable chest x-ray no acute finding CT head dense right MCA bifurcation without hemorrhage MRI brain negative for acute stroke CTA head and neck negative for LVO, psychiatry consulted patient was admitted.   Subjective: seen and examined this morning alert awake.  No complaints. Overnight afebrile  Assessment and Plan: Principal Problem:   Cellulitis and abscess of left leg Active Problems:   Left-sided weakness   Stroke ([place])   Hypertensive urgency   HLD (hyperlipidemia)   Adjustment disorder with mixed disturbance of emotions and conduct   Personality disorder (HCC)   Depression with anxiety   Suicidal ideation   Cocaine abuse (HCC)   Tobacco abuse   Left leg pain abscess/ WOUND:S/p I&D by ED physician wound care consult on board, continue with antibiotics as below. Bacteremia blood culture positive for Streptococcus Staphylococcus: continue antibiotics-on vancomycin-pharmacy dosing. Will consult ID-notified Dr. Steva Ready. Getting TTE.  left-sided weakness and numbness History of stroke: MRI-negative for stroke, CTA negative for LVO, TIA vs. Malingering .  PT OT neurochecks.  Continue home aspirin statin  Left eye blurring/redness- add eye drops.able to see but with some blurriness and appears red.  Suicidal ideation: Resolved, consulted psychiatry-felt no need for inpatient hospitalization or IVC  Hypertensive urgency: BP - 203/112 in ED.due to medication noncompliance-not taking blood pressure medications  currently.started home lisinopril 20 mg daily.  BP is controlled now.  HLD: Continue Lipitor  Adjustment disorder with mixed disturbance of emotions and conduct Personality disorder Anxiety/depression Suicidal ideation-resolved: Seen by psychiatry continue home Celexa, Neurontin, Seroquel.  No need for IVC Patient psychiatric admission per psychiatry Cocaine abuse :counseling about importance of quitting cocaine Tobacco abuse: cont- Nicotine patch  DVT prophylaxis: enoxaparin (LOVENOX) injection 40 mg Start: 11/08/21 1000 Code Status:   Code Status: Full Code Family Communication: plan of care discussed with patient at bedside. Patient status is: Admitted under observation but remains hospitalized due to bacteremia and wound infection Level of care: Telemetry Medical   Dispo: The patient is from: home            Anticipated disposition:  TBD 2 days  Mobility Assessment (last 72 hours)     Mobility Assessment     Row Name 11/09/21 0917 11/08/21 2030 11/08/21 1800       Does patient have an order for bedrest or is patient medically unstable No - Continue assessment No - Continue assessment No - Continue assessment     What is the highest level of mobility based on the progressive mobility assessment? Level 6 (Walks independently in room and hall) - Balance while walking in room without assist - Complete Level 6 (Walks independently in room and hall) - Balance while walking in room without assist - Complete Level 5 (Walks with assist in room/hall) - Balance while stepping forward/back and can walk in room with assist - Complete               Objective: Vitals last 24 hrs: Vitals:   11/09/21 0015 11/09/21 0433 11/09/21 0808 11/09/21 1118  BP: (!) 102/51 139/68 (!) 152/87 (!) 109/51  Pulse: (!) 50 (!) 49 (!) 58 64  Resp: '18 16 20 16  '$ Temp: 98 F (36.7 C) 97.8 F (36.6 C) 98.1 F (36.7 C) 98.1 F (36.7 C)  TempSrc: Oral Oral    SpO2: 100% 100% 100% 97%  Weight:        Weight change:   Physical Examination: General exam: alert awake,older than stated age, weak appearing. HEENT:Oral mucosa moist, Ear/Nose WNL grossly, dentition normal. Respiratory system: bilaterally diminished BS, no use of accessory muscle Cardiovascular system: S1 & S2 +, No JVD. Gastrointestinal system: Abdomen soft,NT,ND, BS+ Nervous System:Alert, awake, moving extremities and grossly nonfocal Extremities: LE edema neg, left posterior thigh with wound with dressing in place, wet/drainage present. Skin: No rashes,no icterus. MSK: Normal muscle bulk,tone, power  Medications reviewed:  Scheduled Meds:  aspirin EC  81 mg Oral Daily   atorvastatin  80 mg Oral QPM   citalopram  10 mg Oral Daily   enoxaparin (LOVENOX) injection  40 mg Subcutaneous Q24H   famotidine  20 mg Oral BID   gabapentin  300 mg Oral TID   haloperidol lactate  2 mg Intravenous Once   lisinopril  20 mg Oral Daily   nicotine  21 mg Transdermal Daily   QUEtiapine  100 mg Oral QHS   Continuous Infusions:  vancomycin 1,500 mg (11/09/21 0912)      Diet Order             Diet regular Room service appropriate? Yes; Fluid consistency: Thin  Diet effective now                            Intake/Output Summary (Last 24 hours) at 11/09/2021 1138 Last data filed at 11/08/2021 1931 Gross per 24 hour  Intake --  Output 200 ml  Net -200 ml   Net IO Since Admission: -100 mL [11/09/21 1138]  Wt Readings from Last 3 Encounters:  11/08/21 102 kg  10/01/21 99 kg  04/28/21 99.6 kg     Unresulted Labs (From admission, onward)     Start     Ordered   11/10/21 0500  Culture, blood (Routine X 2) w Reflex to ID Panel  BLOOD CULTURE X 2,   TIMED      11/09/21 0933          Data Reviewed: I have personally reviewed following labs and imaging studies CBC: Recent Labs  Lab 11/07/21 0916 11/08/21 0514 11/09/21 0545  WBC 8.2 8.3 7.3  NEUTROABS  --  4.1  --   HGB 14.3 13.8 14.0  HCT 46.9 44.7  45.0  MCV 83.9 83.4 84.3  PLT 323 325 937   Basic Metabolic Panel: Recent Labs  Lab 11/07/21 0916 11/08/21 0514 11/09/21 0545  NA 141 142  --   K 3.5 3.9  --   CL 111 108  --   CO2 24 27  --   GLUCOSE 152* 87  --   BUN 9 10  --   CREATININE 0.99 0.85 0.96  CALCIUM 9.3 9.3  --    GFR: Estimated Creatinine Clearance: 103.9 mL/min (by C-G formula based on SCr of 0.96 mg/dL). Liver Function Tests: Recent Labs  Lab 11/07/21 0916 11/08/21 0514  AST 23 21  ALT 14 15  ALKPHOS 91 84  BILITOT 0.2* 0.4  PROT 7.3 7.6  ALBUMIN 3.5 3.8   No results for input(s): "LIPASE", "AMYLASE" in the last 168 hours. No results for input(s): "AMMONIA" in the last 168 hours. Coagulation Profile: Recent  Labs  Lab 11/08/21 0514  INR 1.0   BNP (last 3 results) No results for input(s): "PROBNP" in the last 8760 hours. HbA1C: No results for input(s): "HGBA1C" in the last 72 hours. CBG: Recent Labs  Lab 11/08/21 0507  GLUCAP 82   Lipid Profile: No results for input(s): "CHOL", "HDL", "LDLCALC", "TRIG", "CHOLHDL", "LDLDIRECT" in the last 72 hours. Thyroid Function Tests: No results for input(s): "TSH", "T4TOTAL", "FREET4", "T3FREE", "THYROIDAB" in the last 72 hours. Sepsis Labs: No results for input(s): "PROCALCITON", "LATICACIDVEN" in the last 168 hours.  Recent Results (from the past 240 hour(s))  Culture, blood (Routine X 2) w Reflex to ID Panel     Status: None (Preliminary result)   Collection Time: 11/08/21 10:07 AM   Specimen: BLOOD  Result Value Ref Range Status   Specimen Description   Final    BLOOD LEFT HAND Performed at [facility], [address], [place], [place] [address]    Special Requests   Final    BOTTLES DRAWN AEROBIC AND ANAEROBIC Blood Culture results may not be optimal due to an excessive volume of blood received in culture bottles Performed at [facility], [address] [place], [place] [address]    Culture  Setup Time   Final     Organism ID to follow [place] TO, READ BACK BY AND VERIFIED WITH: CARISSA DOLAN @ [id number] ON 11/09/2021.Marland KitchenMarland KitchenTKR GRAM STAIN REVIEWED-AGREE WITH RESULT Performed at [facility], [place] [address], [place], [place] [address]    Culture GRAM POSITIVE COCCI  Final   Report Status PENDING  Incomplete  Culture, blood (Routine X 2) w Reflex to ID Panel     Status: None (Preliminary result)   Collection Time: 11/08/21 10:07 AM   Specimen: BLOOD  Result Value Ref Range Status   Specimen Description   Final    BLOOD RIGHT [facility] Performed at [facility], [address] [place], [place] [address]    Special Requests   Final    BOTTLES DRAWN AEROBIC AND ANAEROBIC Blood Culture results may not be optimal due to an excessive volume of blood received in culture bottles Performed at [facility], [address] [place], [place] [address]    Culture  Setup Time   Final    AEROBIC BOTTLE ONLY GRAM POSITIVE COCCI CRITICAL VALUE NOTED.  VALUE IS CONSISTENT WITH PREVIOUSLY REPORTED AND CALLED VALUE. GRAM STAIN REVIEWED-AGREE WITH RESULT Performed at [facility], [place] [address], [place], [place] [address]    Culture GRAM POSITIVE COCCI  Final   Report Status PENDING  Incomplete  Blood Culture ID Panel (Reflexed)     Status: Abnormal   Collection Time: 11/08/21 10:07 AM  Result Value Ref Range Status   Enterococcus faecalis NOT DETECTED NOT DETECTED Final   Enterococcus Faecium NOT DETECTED NOT DETECTED Final   Listeria monocytogenes NOT DETECTED NOT DETECTED Final   Staphylococcus species DETECTED (A) NOT DETECTED Final    Comment: CRITICAL RESULT CALLED TO, READ BACK BY AND VERIFIED WITH: CARISSA DOLAN @ [id number] ON 11/09/2021.Marland KitchenMarland KitchenTKR    Staphylococcus aureus (BCID) DETECTED (A) NOT DETECTED Final    Comment: Methicillin (oxacillin)-resistant Staphylococcus aureus (MRSA). MRSA is predictably resistant to beta-lactam  antibiotics (except ceftaroline). Preferred therapy is vancomycin unless clinically contraindicated. Patient requires contact precautions if  hospitalized. CRITICAL RESULT CALLED TO, READ BACK BY AND VERIFIED WITH: CARISSA DOLAN @ [id number] ON 11/09/2021.Marland KitchenMarland KitchenTKR  Staphylococcus epidermidis DETECTED (A) NOT DETECTED Final    Comment: CRITICAL RESULT CALLED TO, READ BACK BY AND VERIFIED WITH: CARISSA DOLAN @ 1962 ON 11/09/2021.Marland KitchenMarland KitchenTKR    Staphylococcus lugdunensis NOT DETECTED NOT DETECTED Final   Streptococcus species DETECTED (A) NOT DETECTED Final    Comment: Not Enterococcus species, Streptococcus agalactiae, Streptococcus pyogenes, or Streptococcus pneumoniae. CRITICAL RESULT CALLED TO, READ BACK BY AND VERIFIED WITH: CARISSA DOLAN @ 2297 ON 11/09/2021.Marland KitchenMarland KitchenTKR    Streptococcus agalactiae NOT DETECTED NOT DETECTED Final   Streptococcus pneumoniae NOT DETECTED NOT DETECTED Final   Streptococcus pyogenes NOT DETECTED NOT DETECTED Final   A.calcoaceticus-baumannii NOT DETECTED NOT DETECTED Final   Bacteroides fragilis NOT DETECTED NOT DETECTED Final   Enterobacterales NOT DETECTED NOT DETECTED Final   Enterobacter cloacae complex NOT DETECTED NOT DETECTED Final   Escherichia coli NOT DETECTED NOT DETECTED Final   Klebsiella aerogenes NOT DETECTED NOT DETECTED Final   Klebsiella oxytoca NOT DETECTED NOT DETECTED Final   Klebsiella pneumoniae NOT DETECTED NOT DETECTED Final   Proteus species NOT DETECTED NOT DETECTED Final   Salmonella species NOT DETECTED NOT DETECTED Final   Serratia marcescens NOT DETECTED NOT DETECTED Final   Haemophilus influenzae NOT DETECTED NOT DETECTED Final   Neisseria meningitidis NOT DETECTED NOT DETECTED Final   Pseudomonas aeruginosa NOT DETECTED NOT DETECTED Final   Stenotrophomonas maltophilia NOT DETECTED NOT DETECTED Final   Candida albicans NOT DETECTED NOT DETECTED Final   Candida auris NOT DETECTED NOT DETECTED Final   Candida glabrata NOT DETECTED NOT  DETECTED Final   Candida krusei NOT DETECTED NOT DETECTED Final   Candida parapsilosis NOT DETECTED NOT DETECTED Final   Candida tropicalis NOT DETECTED NOT DETECTED Final   Cryptococcus neoformans/gattii NOT DETECTED NOT DETECTED Final   Methicillin resistance mecA/C DETECTED (A) NOT DETECTED Final    Comment: CRITICAL RESULT CALLED TO, READ BACK BY AND VERIFIED WITH: CARISSA DOLAN @ [id number] ON 11/09/2021.Marland KitchenMarland KitchenTKR    Meth resistant mecA/C and MREJ DETECTED (A) NOT DETECTED Final    Comment: CRITICAL RESULT CALLED TO, READ BACK BY AND VERIFIED WITH: CARISSA DOLAN @ 1194 ON 11/09/2021.Marland KitchenMarland KitchenTKR Performed at [facility], [address] [place], [place] [address]     Antimicrobials: Anti-infectives (From admission, onward)    Start     Dose/Rate Route Frequency Ordered Stop   11/09/21 1200  cefTRIAXone (ROCEPHIN) 1 g in sodium chloride 0.9 % 100 mL IVPB  Status:  Discontinued        1 g 200 mL/hr over 30 Minutes Intravenous Every 24 hours 11/08/21 1457 11/09/21 0155   11/08/21 2200  vancomycin (VANCOREADY) IVPB 1500 mg/300 mL        1,500 mg 150 mL/hr over 120 Minutes Intravenous Every 12 hours 11/08/21 1459     11/08/21 1000  vancomycin (VANCOREADY) IVPB 2000 mg/400 mL        2,000 mg 200 mL/hr over 120 Minutes Intravenous  Once 11/08/21 0947 11/08/21 1256   11/08/21 0815  cefTRIAXone (ROCEPHIN) 1 g in sodium chloride 0.9 % 100 mL IVPB        1 g 200 mL/hr over 30 Minutes Intravenous  Once 11/08/21 0803 11/08/21 1057   11/08/21 0815  vancomycin (VANCOREADY) IVPB 1500 mg/300 mL  Status:  Discontinued        1,500 mg 150 mL/hr over 120 Minutes Intravenous  Once 11/08/21 0803 11/08/21 0947      Culture/Microbiology    Component Value Date/Time   SDES  11/08/2021 1007  BLOOD LEFT HAND Performed at [facility], [address] [place], [place] [address]    SDES  11/08/2021 1007    BLOOD RIGHT [facility] Performed at [facility], [place],  [place] [address]    [facility]  11/08/2021 1007    BOTTLES DRAWN AEROBIC AND ANAEROBIC Blood Culture results may not be optimal due to an excessive volume of blood received in culture bottles Performed at [facility], [address] [place], [place] [address]    [facility]  11/08/2021 1007    BOTTLES DRAWN AEROBIC AND ANAEROBIC Blood Culture results may not be optimal due to an excessive volume of blood received in culture bottles Performed at [facility], [address] [place], [place] [address]    [place] 11/08/2021 1007   CULT GRAM POSITIVE COCCI 11/08/2021 1007   REPTSTATUS PENDING 11/08/2021 1007   REPTSTATUS PENDING 11/08/2021 1007  Other culture-see note  Radiology Studies: MR BRAIN WO CONTRAST  Result Date: 11/08/2021 CLINICAL DATA:  Left-sided weakness with slurred speech. EXAM: MRI HEAD WITHOUT CONTRAST TECHNIQUE: Multiplanar, multiecho pulse sequences of the brain and surrounding structures were obtained without intravenous contrast. COMPARISON:  04/28/2021 FINDINGS: Brain: No acute infarction, hemorrhage, hydrocephalus, extra-axial collection or mass effect. Subcentimeter lipoma posterior to the left inferior colliculus. Vascular: Major flow voids are preserved.  There was preceding CTA. Skull and upper cervical spine: No focal marrow lesion Sinuses/Orbits: Negative IMPRESSION: Stable motion degraded brain MRI.  No acute infarct. Electronically Signed   By: Jorje Guild M.D.   On: 11/08/2021 07:15   CT ANGIO HEAD NECK W WO CM  Result Date: 11/08/2021 CLINICAL DATA:  Stroke follow-up EXAM: CT ANGIOGRAPHY HEAD AND NECK TECHNIQUE: Multidetector CT imaging of the head and neck was performed using the standard protocol during bolus administration of intravenous contrast. Multiplanar CT image reconstructions and MIPs were obtained to evaluate the vascular anatomy. Carotid stenosis measurements (when applicable) are obtained utilizing NASCET criteria,  using the distal internal carotid diameter as the denominator. RADIATION DOSE REDUCTION: This exam was performed according to the departmental dose-optimization program which includes automated exposure control, adjustment of the mA and/or kV according to patient size and/or use of iterative reconstruction technique. CONTRAST:  4m OMNIPAQUE IOHEXOL 350 MG/ML SOLN COMPARISON:  04/28/2021 FINDINGS: CTA NECK FINDINGS Aortic arch: Atheromatous plaque. Right carotid system: No stenosis or ulceration. Left carotid system: No stenosis or ulceration. Vertebral arteries: No proximal subclavian stenosis. The vertebral arteries are smoothly contoured and widely patent to the dura. Skeleton: Cervical spine degeneration.  No acute finding Other neck: Negative Upper chest: Negative Review of the MIP images confirms the above findings CTA HEAD FINDINGS Anterior circulation: No major branch occlusion, beading, or flow limiting stenosis. Negative for aneurysm. Stable appearance of right MCA with mainstem serving numerous branches. More typical bifurcation branching pattern on the left. Posterior circulation: The vertebral and basilar arteries are smoothly contoured and widely patent. No branch occlusion, beading, or aneurysm Venous sinuses: Diffusely patent Anatomic variants: None significant Review of the MIP images confirms the above findings These results were called by telephone at the time of interpretation on 11/08/2021 at 5:36 am [facility], who verbally acknowledged these results. IMPRESSION: Negative CTA.  No large vessel occlusion or stenosis. Electronically Signed   By: JJorje GuildM.D.   On: 11/08/2021 05:37   CT HEAD CODE STROKE WO CONTRAST  Result Date: 11/08/2021 CLINICAL DATA:  Code stroke.  Weakness and  confusion EXAM: CT HEAD WITHOUT CONTRAST TECHNIQUE: Contiguous axial images were obtained from the base of the skull through the vertex without intravenous contrast. RADIATION DOSE REDUCTION:  This exam was performed according to the departmental dose-optimization program which includes automated exposure control, adjustment of the mA and/or kV according to patient size and/or use of iterative reconstruction technique. COMPARISON:  04/28/2021 FINDINGS: Brain: No evidence of acute infarction, hemorrhage, hydrocephalus, extra-axial collection or mass lesion/mass effect. Vascular: Dense appearance of the right MCA bifurcation Skull: Normal. Negative for fracture or focal lesion. Sinuses/Orbits: No acute finding. Other: These results were called by telephone at the time of interpretation on 11/08/2021 at 5:21 am to provider [facility] , who verbally acknowledged these results. ASPECTS [facility] Stroke Program Early CT Score) - Ganglionic level infarction (caudate, lentiform nuclei, internal capsule, insula, M1-M3 cortex): 7 - Supraganglionic infarction (M4-M6 cortex): 3 Total score (0-10 with 10 being normal): 10 IMPRESSION: 1. Dense right MCA bifurcation, CTA is pending. 2. Negative for hemorrhage.  ASPECTS is 10. Electronically Signed   By: Jorje Guild M.D.   On: 11/08/2021 05:22     LOS: 0 days   Antonieta Pert, MD [facility]  11/09/2021, 11:38 AM

## 2021-11-09 NOTE — Hospital Course (Addendum)
64M, hx of HTN, HLD, TIA, GERD, multiple psych issue (depression, anxiety, personal disorder, malingering, adjustment disorder), tobacco abuse, cocaine abuse, Bell's palsy, presented with multiple complaints including left-sided weakness numbness, suicidal ideation and left leg wound. Patient seen in the ED underwent lab work essentially unremarkable chest x-ray no acute finding CT head dense right MCA bifurcation without hemorrhage MRI brain negative for acute stroke CTA head and neck negative for LVO, psychiatry consulted patient was admitted.  Wound and blood cultures with MRSA.  Blood cultures also grew coagulase-negative staph and streptococcal species which is thought to be due to contaminant per ID. Repeat blood cultures on 11/10/2021 remain negative.  7/24: Patient had echocardiogram today which was found to be normal, TEE is scheduled for tomorrow.  7/25: Patient had normal TEE today, no sign of endocarditis.  He wants to stay in hospital to complete IV antibiotics.  Will appreciate ID recommendations.  7/26: ID is recommending 2 weeks of IV antibiotics and for which patient will remain in the hospital, Followed by 2 weeks of p.o. antibiotics.  Remained stable. Had a small fall overnight while walking independently, apparently hurt left knee but imaging was negative for any acute abnormality.  No other injuries.  7/27: Patient remained stable.  Patient will need 2 weeks of vancomycin until 11/23/2021 followed by 2 weeks of p.o. linezolid per ID recommendations.  7/28: Patient remained stable, vancomycin till 11/23/2021 followed by 2 weeks of linezolid.  7/29: Remains stable, having some indigestion, refusing MiraLAX, we will try some Bentyl. Patient also saying that he was vomiting the whole night and nursing staff were giving him medications.  No record of any of those incidents.  Patient only received 1 dose of Zofran around 9 PM.  No witnessed vomiting.  7/30: Abdominal x-ray was obtained  with his persistent complaint of abdominal pain and distention, no noted abnormality noted except significant amount of stool and gas in colon and rectum.  Started on MiraLAX.  7/31: Patient continued to complain about indigestion and saying that he vomited multiple times, no witnessed vomit and he never received any thing to be seen by nursing staff. He was also not convinced about abdominal x-ray with concern of constipation. Keep saying that he wants to have scopes done while in the hospital, again explained that issue needs to be addressed as an outpatient as there is no emergent need. Hemoglobin improving and stable.  Labs are stable. Refused last night dose of vancomycin stating that his arm is hurting. Plan is to still complete vancomycin on 11/23/2021 and discharged on linezolid for another 2 weeks.  8/1: Patient seems very upset that GI has not been consulted.  Per patient he was having hematemesis and passing bloody stools the whole night, no witnessed episodes. CBC this morning remained stable with hemoglobin of 13.7. Patient with multiple different complaints every day and demanding extensive investigation, despite reassurance he continued to get upset. GI was consulted- Dr. Allen Norris will see him later today. Made the patient n.p.o.-he does not want to hold diet now either.

## 2021-11-09 NOTE — Consult Note (Signed)
NAME: Samuel Molina  DOB: 02/22/1961  MRN: [id number]  Date/Time: 11/09/2021 8:52 AM  REQUESTING PROVIDER Dr.KC RAmesh Subjective:  REASON FOR CONSULT: MRSA bacteremia ? Samuel Molina is a 61 y.o. with a history  HTN, CVAof substance use, cva, anxiety Presented with left sided weakness. 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 Had minimal arm weakness. HE had a cva 2 years ago and has had TIA s Vitals in the ED BP 158/109, temp 98, pulse 70 and stas 97# Cbc N CTA head normal MRI brain no acute infarct Urine tox positive for cocaine He had an abscess loeft loer extremity and thought it was a spider bite- he had come to the ED a day befor efor that and EDP did I/D and put him on amoxicillin and doxy Blood culture sent this presentaiton is positive for MRSA  strep Pt is currently on vanco and I am seeing him for the same  Past Medical History:  Diagnosis Date   Anxiety    Depression    GERD (gastroesophageal reflux disease)    Obesity    Stroke [facility])    Substance abuse ([place])     Past Surgical History:  Procedure Laterality Date   ESOPHAGOGASTRODUODENOSCOPY (EGD) WITH PROPOFOL N/A 09/20/2017   Procedure: ESOPHAGOGASTRODUODENOSCOPY (EGD) WITH PROPOFOL;  Surgeon: Lin Landsman, MD;  Location: [facility] ENDOSCOPY;  Service: Gastroenterology;  Laterality: N/A;   KNEE SURGERY Right     Social History   Socioeconomic History   Marital status: Single    Spouse name: Not on file   Number of children: Not on file   Years of education: Not on file   Highest education level: Not on file  Occupational History   Not on file  Tobacco Use   Smoking status: Every Day    Packs/day: 0.50    Types: Cigarettes   Smokeless tobacco: Never  Vaping Use   Vaping Use: Never used  Substance and Sexual Activity   Alcohol use: Not Currently   Drug use: Yes    Types: Cocaine    Comment: used two days ago   Sexual  activity: Not Currently  Other Topics Concern   Not on file  Social History Narrative   Not on file   Social Determinants of Health   Financial Resource Strain: Not on file  Food Insecurity: Not on file  Transportation Needs: Not on file  Physical Activity: Not on file  Stress: Not on file  Social Connections: Not on file  Intimate Partner Violence: Not on file    Family History  Problem Relation Age of Onset   Vasculitis Mother    Allergies  Allergen Reactions   Acetaminophen Itching, Nausea And Vomiting and Rash   I? Current Facility-Administered Medications  Medication Dose Route Frequency Provider Last Rate Last Admin   aspirin EC tablet 81 mg  81 mg Oral Daily Ivor Costa, MD   81 mg at 11/08/21 1256   atorvastatin (LIPITOR) tablet 80 mg  80 mg Oral QPM Ivor Costa, MD   80 mg at 11/08/21 1705   citalopram (CELEXA) tablet 10 mg  10 mg Oral Daily Ivor Costa, MD   10 mg at 11/08/21 1256   enoxaparin (LOVENOX) injection 40 mg  40 mg Subcutaneous Q24H Ivor Costa, MD   40 mg at 11/08/21 1029   famotidine (PEPCID) tablet 20 mg  20 mg Oral BID Ivor Costa, MD  20 mg at 11/08/21 2049   gabapentin (NEURONTIN) capsule 300 mg  300 mg Oral TID Ivor Costa, MD   300 mg at 11/08/21 2049   haloperidol lactate (HALDOL) injection 2 mg  2 mg Intravenous Once Ward, Kristen N, DO       hydrALAZINE (APRESOLINE) injection 5 mg  5 mg Intravenous Q2H PRN Ivor Costa, MD       ibuprofen (ADVIL) tablet 400 mg  400 mg Oral Q6H PRN Ivor Costa, MD       ketorolac (TORADOL) 30 MG/ML injection 30 mg  30 mg Intravenous Q6H PRN Ivor Costa, MD   30 mg at 11/09/21 0444   lisinopril (ZESTRIL) tablet 20 mg  20 mg Oral Daily Ivor Costa, MD   20 mg at 11/08/21 1256   LORazepam (ATIVAN) injection 1 mg  1 mg Intravenous Q8H PRN Ivor Costa, MD       nicotine (NICODERM CQ - dosed in mg/24 hours) patch 21 mg  21 mg Transdermal Daily Ivor Costa, MD       ondansetron [facility]) injection 4 mg  4 mg Intravenous Q8H PRN Ivor Costa, MD       QUEtiapine (SEROQUEL) tablet 100 mg  100 mg Oral QHS Ivor Costa, MD   100 mg at 11/08/21 2049   vancomycin (VANCOREADY) IVPB 1500 mg/300 mL  1,500 mg Intravenous Q12H Dallie Piles, RPH 150 mL/hr at 11/09/21 0019 1,500 mg at 11/09/21 0019     Abtx:  Anti-infectives (From admission, onward)    Start     Dose/Rate Route Frequency Ordered Stop   11/09/21 1200  cefTRIAXone (ROCEPHIN) 1 g in sodium chloride 0.9 % 100 mL IVPB  Status:  Discontinued        1 g 200 mL/hr over 30 Minutes Intravenous Every 24 hours 11/08/21 1457 11/09/21 0155   11/08/21 2200  vancomycin (VANCOREADY) IVPB 1500 mg/300 mL        1,500 mg 150 mL/hr over 120 Minutes Intravenous Every 12 hours 11/08/21 1459     11/08/21 1000  vancomycin (VANCOREADY) IVPB 2000 mg/400 mL        2,000 mg 200 mL/hr over 120 Minutes Intravenous  Once 11/08/21 0947 11/08/21 1256   11/08/21 0815  cefTRIAXone (ROCEPHIN) 1 g in sodium chloride 0.9 % 100 mL IVPB        1 g 200 mL/hr over 30 Minutes Intravenous  Once 11/08/21 0803 11/08/21 1057   11/08/21 0815  vancomycin (VANCOREADY) IVPB 1500 mg/300 mL  Status:  Discontinued        1,500 mg 150 mL/hr over 120 Minutes Intravenous  Once 11/08/21 0803 11/08/21 0947       REVIEW OF SYSTEMS:  Const: negative fever, negative chills, negative weight loss Eyes: negative diplopia or visual changes, negative eye pain ENT: negative coryza, negative sore throat Resp: negative cough, hemoptysis, dyspnea Cards: negative for chest pain, palpitations, lower extremity edema GU: negative for frequency, dysuria and hematuria GI: acid reflux, hiccups Skin:abscess left thigh posteriorlys Heme: negative for easy bruising and gum/nose bleeding MS: weakness Neurolo:numbness left leg  Psych: anxiety Endocrine: negative for thyroid, diabetes Allergy/Immunology- as above Objective:  VITALS:  BP (!) 152/87 (BP Location: Left Arm)   Pulse (!) 58   Temp 98.1 F (36.7 C)   Resp 20   Wt  102 kg   SpO2 100%   BMI 29.67 kg/m   PHYSICAL EXAM:  General: Alert, cooperative, no distress, appears stated age.  Head: Normocephalic, without  obvious abnormality, atraumatic. Eyes: Conjunctivae clear, anicteric sclerae. Pupils are equal ENT Nares normal. No drainage or sinus tenderness. Lips, mucosa, and tongue normal. No Thrush Neck: Supple, symmetrical, no adenopathy, thyroid: non tender no carotid bruit and no JVD. Back: No CVA tenderness. Lungs: Clear to auscultation bilaterally. No Wheezing or Rhonchi. No rales. Heart: Regular rate and rhythm, no murmur, rub or gallop. Abdomen: Soft, non-tender,not distended. Bowel sounds normal. No masses Extremities: posterior aspect of left thigh there is an abscess with tissue induration, it has been I/D and there is a wick in the center culture taken Skin: No rashes or lesions. Or bruising Lymph: Cervical, supraclavicular normal. Neurologic: Grossly non-focal Pertinent Labs Lab Results CBC    Component Value Date/Time   WBC 7.3 11/09/2021 0545   RBC 5.34 11/09/2021 0545   HGB 14.0 11/09/2021 0545   HCT 45.0 11/09/2021 0545   PLT 291 11/09/2021 0545   MCV 84.3 11/09/2021 0545   MCH 26.2 11/09/2021 0545   MCHC 31.1 11/09/2021 0545   RDW 14.4 11/09/2021 0545   LYMPHSABS 3.1 11/08/2021 0514   MONOABS 1.0 11/08/2021 0514   EOSABS 0.1 11/08/2021 0514   BASOSABS 0.0 11/08/2021 0514       Latest Ref Rng & Units 11/09/2021    5:45 AM 11/08/2021    5:14 AM 11/07/2021    9:16 AM  CMP  Glucose 70 - 99 mg/dL  87  152   BUN 6 - 20 mg/dL  10  9   Creatinine 0.61 - 1.24 mg/dL 0.96  0.85  0.99   Sodium 135 - 145 mmol/L  142  141   Potassium 3.5 - 5.1 mmol/L  3.9  3.5   Chloride 98 - 111 mmol/L  108  111   CO2 22 - 32 mmol/L  27  24   Calcium 8.9 - 10.3 mg/dL  9.3  9.3   Total Protein 6.5 - 8.1 g/dL  7.6  7.3   Total Bilirubin 0.3 - 1.2 mg/dL  0.4  0.2   Alkaline Phos 38 - 126 U/L  84  91   AST 15 - 41 U/L  21  23   ALT 0 - 44 U/L   15  14       Microbiology: Recent Results (from the past 240 hour(s))  Culture, blood (Routine X 2) w Reflex to ID Panel     Status: None (Preliminary result)   Collection Time: 11/08/21 10:07 AM   Specimen: BLOOD  Result Value Ref Range Status   Specimen Description   Final    BLOOD LEFT HAND Performed at [facility], [address] [place], [place] [address]    Special Requests   Final    BOTTLES DRAWN AEROBIC AND ANAEROBIC Blood Culture results may not be optimal due to an excessive volume of blood received in culture bottles Performed at [facility], [address] [place], [place] [address]    Culture  Setup Time   Final    Organism ID to follow [place] TO, READ BACK BY AND VERIFIED WITH: CARISSA DOLAN @ [id number] ON 11/09/2021.Marland KitchenMarland KitchenTKR GRAM STAIN REVIEWED-AGREE WITH RESULT Performed at [facility], [place] [address], [place], [place] [address]    Culture GRAM POSITIVE COCCI  Final   Report Status PENDING  Incomplete  Culture, blood (Routine X 2) w Reflex to ID Panel     Status: None (Preliminary result)   Collection Time: 11/08/21  10:07 AM   Specimen: BLOOD  Result Value Ref Range Status   Specimen Description   Final    BLOOD RIGHT [facility] Performed at [facility], [place] [place], [place] [address]    Special Requests   Final    BOTTLES DRAWN AEROBIC AND ANAEROBIC Blood Culture results may not be optimal due to an excessive volume of blood received in culture bottles Performed at [facility], [address] [place], [place] [address]    Culture  Setup Time   Final    AEROBIC BOTTLE ONLY GRAM POSITIVE COCCI CRITICAL VALUE NOTED.  VALUE IS CONSISTENT WITH PREVIOUSLY REPORTED AND CALLED VALUE. GRAM STAIN REVIEWED-AGREE WITH RESULT Performed at [facility], [place] [address], [place], [place] [address]    Culture GRAM POSITIVE COCCI  Final   Report Status  PENDING  Incomplete  Blood Culture ID Panel (Reflexed)     Status: Abnormal   Collection Time: 11/08/21 10:07 AM  Result Value Ref Range Status   Enterococcus faecalis NOT DETECTED NOT DETECTED Final   Enterococcus Faecium NOT DETECTED NOT DETECTED Final   Listeria monocytogenes NOT DETECTED NOT DETECTED Final   Staphylococcus species DETECTED (A) NOT DETECTED Final    Comment: CRITICAL RESULT CALLED TO, READ BACK BY AND VERIFIED WITH: CARISSA DOLAN @ 0762 ON 11/09/2021.Marland KitchenMarland KitchenTKR    Staphylococcus aureus (BCID) DETECTED (A) NOT DETECTED Final    Comment: Methicillin (oxacillin)-resistant Staphylococcus aureus (MRSA). MRSA is predictably resistant to beta-lactam antibiotics (except ceftaroline). Preferred therapy is vancomycin unless clinically contraindicated. Patient requires contact precautions if  hospitalized. CRITICAL RESULT CALLED TO, READ BACK BY AND VERIFIED WITH: CARISSA DOLAN @ 2633 ON 11/09/2021.Marland KitchenMarland KitchenTKR    Staphylococcus epidermidis DETECTED (A) NOT DETECTED Final    Comment: CRITICAL RESULT CALLED TO, READ BACK BY AND VERIFIED WITH: CARISSA DOLAN @ 3545 ON 11/09/2021.Marland KitchenMarland KitchenTKR    Staphylococcus lugdunensis NOT DETECTED NOT DETECTED Final   Streptococcus species DETECTED (A) NOT DETECTED Final    Comment: Not Enterococcus species, Streptococcus agalactiae, Streptococcus pyogenes, or Streptococcus pneumoniae. CRITICAL RESULT CALLED TO, READ BACK BY AND VERIFIED WITH: CARISSA DOLAN @ [phone] ON 11/09/2021.Marland KitchenMarland KitchenTKR    Streptococcus agalactiae NOT DETECTED NOT DETECTED Final   Streptococcus pneumoniae NOT DETECTED NOT DETECTED Final   Streptococcus pyogenes NOT DETECTED NOT DETECTED Final   A.calcoaceticus-baumannii NOT DETECTED NOT DETECTED Final   Bacteroides fragilis NOT DETECTED NOT DETECTED Final   Enterobacterales NOT DETECTED NOT DETECTED Final   Enterobacter cloacae complex NOT DETECTED NOT DETECTED Final   Escherichia coli NOT DETECTED NOT DETECTED Final   Klebsiella aerogenes NOT  DETECTED NOT DETECTED Final   Klebsiella oxytoca NOT DETECTED NOT DETECTED Final   Klebsiella pneumoniae NOT DETECTED NOT DETECTED Final   Proteus species NOT DETECTED NOT DETECTED Final   Salmonella species NOT DETECTED NOT DETECTED Final   Serratia marcescens NOT DETECTED NOT DETECTED Final   Haemophilus influenzae NOT DETECTED NOT DETECTED Final   Neisseria meningitidis NOT DETECTED NOT DETECTED Final   Pseudomonas aeruginosa NOT DETECTED NOT DETECTED Final   Stenotrophomonas maltophilia NOT DETECTED NOT DETECTED Final   Candida albicans NOT DETECTED NOT DETECTED Final   Candida auris NOT DETECTED NOT DETECTED Final   Candida glabrata NOT DETECTED NOT DETECTED Final   Candida krusei NOT DETECTED NOT DETECTED Final   Candida parapsilosis NOT DETECTED NOT DETECTED Final   Candida tropicalis NOT DETECTED NOT DETECTED Final   Cryptococcus neoformans/gattii NOT DETECTED NOT DETECTED Final   Methicillin resistance mecA/C  DETECTED (A) NOT DETECTED Final    Comment: CRITICAL RESULT CALLED TO, READ BACK BY AND VERIFIED WITH: CARISSA DOLAN @ 8403 ON 11/09/2021.Marland KitchenMarland KitchenTKR    Meth resistant mecA/C and MREJ DETECTED (A) NOT DETECTED Final    Comment: CRITICAL RESULT CALLED TO, READ BACK BY AND VERIFIED WITH: CARISSA DOLAN @ 7543 ON 11/09/2021.Marland KitchenMarland KitchenTKR Performed at [facility], [place], [place] [address]     IMAGING RESULTS: CT head =no acute infact MRI okay CTA okay I have personally reviewed the films ? Impression/Recommendation ? ?MRSA and strep bacteremia likely source left thigh abscess/soft issue infection S/p I/D Will send cultures from it Continue with vanco Will need 2 d echo Repeat blood cultures  Left leg numbness  ? TIA  Hypertensive emergency due to non compliance Could be precipitated by cocaine as well Management as per primary team  HLD  Tobacco use  Discussed the management with the patient and the care  team ? ___________________________________________________ Discussed with patient, requesting provider Note:  This document was prepared using Dragon voice recognition software and may include unintentional dictation errors.

## 2021-11-09 NOTE — Plan of Care (Signed)

## 2021-11-09 NOTE — Progress Notes (Signed)
PHARMACY - PHYSICIAN COMMUNICATION CRITICAL VALUE ALERT - BLOOD CULTURE IDENTIFICATION (BCID)  Samuel Molina is an 61 y.o. male who presented to [facility] on 11/08/2021 with a chief complaint of , left leg pain due to abscess  Assessment:  2/4 (aerobic bottles) GPC: MRSA, MRSE, and streptococcus species suspected source of left leg abscess.   Name of physician (or Provider) Contacted: Dr. Sidney Ace  Current antibiotics: Ceftriaxone, Vancomycin  Changes to prescribed antibiotics recommended: continuing Vancomycin therapy at this time and discontinuing ceftriaxone  Recommendations accepted by provider  Results for orders placed or performed during the hospital encounter of 11/08/21  Blood Culture ID Panel (Reflexed) (Collected: 11/08/2021 10:07 AM)  Result Value Ref Range   Enterococcus faecalis NOT DETECTED NOT DETECTED   Enterococcus Faecium NOT DETECTED NOT DETECTED   Listeria monocytogenes NOT DETECTED NOT DETECTED   Staphylococcus species DETECTED (A) NOT DETECTED   Staphylococcus aureus (BCID) DETECTED (A) NOT DETECTED   Staphylococcus epidermidis DETECTED (A) NOT DETECTED   Staphylococcus lugdunensis NOT DETECTED NOT DETECTED   Streptococcus species DETECTED (A) NOT DETECTED   Streptococcus agalactiae NOT DETECTED NOT DETECTED   Streptococcus pneumoniae NOT DETECTED NOT DETECTED   Streptococcus pyogenes NOT DETECTED NOT DETECTED   A.calcoaceticus-baumannii NOT DETECTED NOT DETECTED   Bacteroides fragilis NOT DETECTED NOT DETECTED   Enterobacterales NOT DETECTED NOT DETECTED   Enterobacter cloacae complex NOT DETECTED NOT DETECTED   Escherichia coli NOT DETECTED NOT DETECTED   Klebsiella aerogenes NOT DETECTED NOT DETECTED   Klebsiella oxytoca NOT DETECTED NOT DETECTED   Klebsiella pneumoniae NOT DETECTED NOT DETECTED   Proteus species NOT DETECTED NOT DETECTED   Salmonella species NOT DETECTED NOT DETECTED   Serratia marcescens NOT DETECTED NOT DETECTED   Haemophilus  influenzae NOT DETECTED NOT DETECTED   Neisseria meningitidis NOT DETECTED NOT DETECTED   Pseudomonas aeruginosa NOT DETECTED NOT DETECTED   Stenotrophomonas maltophilia NOT DETECTED NOT DETECTED   Candida albicans NOT DETECTED NOT DETECTED   Candida auris NOT DETECTED NOT DETECTED   Candida glabrata NOT DETECTED NOT DETECTED   Candida krusei NOT DETECTED NOT DETECTED   Candida parapsilosis NOT DETECTED NOT DETECTED   Candida tropicalis NOT DETECTED NOT DETECTED   Cryptococcus neoformans/gattii NOT DETECTED NOT DETECTED   Methicillin resistance mecA/C DETECTED (A) NOT DETECTED   Meth resistant mecA/C and MREJ DETECTED (A) NOT [place] E Rethel Sebek, PharmD, BCPS Clinical Pharmacist   11/09/2021  1:40 AM

## 2021-11-09 NOTE — Progress Notes (Signed)
Pt refusing cardiac monitoring at this time. Pt educated on purpose of cardiac monitoring but continues to refuse.

## 2021-11-10 ENCOUNTER — Inpatient Hospital Stay: Admit: 2021-11-10 | Payer: 59

## 2021-11-10 DIAGNOSIS — L02416 Cutaneous abscess of left lower limb: Principal | ICD-10-CM

## 2021-11-10 DIAGNOSIS — R7881 Bacteremia: Secondary | ICD-10-CM | POA: Diagnosis not present

## 2021-11-10 DIAGNOSIS — L03116 Cellulitis of left lower limb: Secondary | ICD-10-CM

## 2021-11-10 DIAGNOSIS — B955 Unspecified streptococcus as the cause of diseases classified elsewhere: Secondary | ICD-10-CM | POA: Insufficient documentation

## 2021-11-10 DIAGNOSIS — B9562 Methicillin resistant Staphylococcus aureus infection as the cause of diseases classified elsewhere: Secondary | ICD-10-CM

## 2021-11-10 LAB — CBC
HCT: 42.1 % (ref 39.0–52.0)
Hemoglobin: 13.2 g/dL (ref 13.0–17.0)
MCH: 25.9 pg — ABNORMAL LOW (ref 26.0–34.0)
MCHC: 31.4 g/dL (ref 30.0–36.0)
MCV: 82.7 fL (ref 80.0–100.0)
Platelets: 267 10*3/uL (ref 150–400)
RBC: 5.09 MIL/uL (ref 4.22–5.81)
RDW: 14.3 % (ref 11.5–15.5)
WBC: 8.2 10*3/uL (ref 4.0–10.5)
nRBC: 0 % (ref 0.0–0.2)

## 2021-11-10 LAB — BASIC METABOLIC PANEL
Anion gap: 9 (ref 5–15)
BUN: 13 mg/dL (ref 6–20)
CO2: 23 mmol/L (ref 22–32)
Calcium: 8.9 mg/dL (ref 8.9–10.3)
Chloride: 109 mmol/L (ref 98–111)
Creatinine, Ser: 1 mg/dL (ref 0.61–1.24)
GFR, Estimated: >60 mL/min (ref >60)
Glucose, Bld: 156 mg/dL — ABNORMAL HIGH (ref 70–99)
Potassium: 4 mmol/L (ref 3.5–5.1)
Sodium: 141 mmol/L (ref 135–145)

## 2021-11-10 MED ORDER — METHOCARBAMOL 500 MG PO TABS
500.0000 mg | ORAL_TABLET | Freq: Three times a day (TID) | ORAL | Status: DC | PRN
  Administered 2021-11-10 – 2021-11-23 (×14): 500 mg via ORAL
  Filled 2021-11-10 (×15): qty 1

## 2021-11-10 NOTE — Progress Notes (Signed)
PROGRESS NOTE Samuel Molina  QBH:[id number] DOB: 02-14-1961 DOA: 11/08/2021 PCP: Pcp, No   Brief Narrative/Hospital Course: 6M, hx of HTN, HLD, TIA, GERD, multiple psych issue (depression, anxiety, personal disorder, malingering, adjustment disorder), tobacco abuse, cocaine abuse, Bell's palsy, presented with multiple complaints including left-sided weakness numbness, suicidal ideation and left leg wound. Patient seen in the ED underwent lab work essentially unremarkable chest x-ray no acute finding CT head dense right MCA bifurcation without hemorrhage MRI brain negative for acute stroke CTA head and neck negative for LVO, psychiatry consulted patient was admitted.   Subjective: Seen and examined.  Complains of pain cramps on his left thigh. Overnight afebrile, BP stable. Nursing reports has been refusing to put on telemetry.  Has been asking for double tray meal  Assessment and Plan: Principal Problem:   Cellulitis and abscess of left leg Active Problems:   Left-sided weakness   Stroke ([place])   Hypertensive urgency   HLD (hyperlipidemia)   Adjustment disorder with mixed disturbance of emotions and conduct   Personality disorder ([place])   Depression with anxiety   Suicidal ideation   Cocaine abuse ([place])   Tobacco abuse   MRSA bacteremia   Streptococcal bacteremia   Left thigh abscess:S/p I&D by ED physician wound care consult on board, continue with antibiotics as below.  Wound culture growing few staph, sensitivity pending  MRSA bacteremia Streptococcal bacteremia: Source likely from thigh abscess from above.ID consult appreciated-continue vancomycin, follow-up echocardiogram, repeat blood culture 7/20. Recent Labs  Lab 11/07/21 0916 11/08/21 0514 11/09/21 0545 11/10/21 1004  WBC 8.2 8.3 7.3 8.2     Left-sided weakness and numbness History of stroke: MRI-negative for stroke,CTA neg for LVO,TIA vs.Malingering.PT-OT neuro-checks. Cont home aspirin statin  Left eye  blurring/redness- addED eye drops.able to see but with some blurriness and appears red. Mri Brain neg. denies any complaints today  Hypertensive urgency: BP - 203/112 in ED. BP stabilized now.this is due to medication noncompliance-not taking blood pressure medications currently.Started home lisinopril 20 mg daily.    HLD: Continue Lipitor  Adjustment disorder with mixed disturbance of emotions and conduct Personality disorder Anxiety/depression Suicidal ideation-resolved: Seen by psychiatry continue home Celexa, Neurontin, Seroquel.  No need for IVC or inpatient psychiatric admission per psychiatry  Cocaine abuse :counseling about importance of quitting cocaine Tobacco abuse: cont- Nicotine patch  DVT prophylaxis: enoxaparin (LOVENOX) injection 40 mg Start: 11/08/21 1000 Code Status:   Code Status: Full Code Family Communication: plan of care discussed with patient at bedside. Patient status is: Admitted under observation but remains hospitalized due to bacteremia and wound infection Level of care: Med-Surg   Dispo: The patient is from: home            Anticipated disposition:  TBD 2 days  Mobility Assessment (last 72 hours)     Mobility Assessment     Row Name 11/09/21 1905 11/09/21 0917 11/08/21 2030 11/08/21 1800     Does patient have an order for bedrest or is patient medically unstable No - Continue assessment No - Continue assessment No - Continue assessment No - Continue assessment    What is the highest level of mobility based on the progressive mobility assessment? Level 6 (Walks independently in room and hall) - Balance while walking in room without assist - Complete Level 6 (Walks independently in room and hall) - Balance while walking in room without assist - Complete Level 6 (Walks independently in room and hall) - Balance while walking in room without assist - Complete  Level 5 (Walks with assist in room/hall) - Balance while stepping forward/back and can walk in room  with assist - Complete              Objective: Vitals last 24 hrs: Vitals:   11/09/21 1643 11/09/21 2100 11/10/21 0431 11/10/21 0841  BP: (!) 135/47 129/79 (!) 155/67 (!) 141/58  Pulse: 64 (!) 59 66 60  Resp: '18 20 19 18  '$ Temp: 98.2 F (36.8 C) 98.2 F (36.8 C) 98 F (36.7 C) 98.4 F (36.9 C)  TempSrc: Oral Oral Oral Oral  SpO2: 98% 99% 100% 97%  Weight:       Weight change:   Physical Examination: General exam: AA OX3, older than stated age, weak appearing. HEENT:Oral mucosa moist, Ear/Nose WNL grossly, dentition normal. Respiratory system: bilaterally diminished, no use of accessory muscle Cardiovascular system: S1 & S2 +, No JVD,. Gastrointestinal system: Abdomen soft,NT,ND,BS+ Nervous System:Alert, awake, moving extremities and grossly nonfocal Extremities: Left post thigh wound with dressing in place, distal peripheral pulses palpable.  Skin: No rashes,no icterus. MSK: Normal muscle bulk,tone, power   Medications reviewed:  Scheduled Meds:  aspirin EC  81 mg Oral Daily   atorvastatin  80 mg Oral QPM   citalopram  10 mg Oral Daily   enoxaparin (LOVENOX) injection  40 mg Subcutaneous Q24H   famotidine  20 mg Oral BID   gabapentin  300 mg Oral TID   haloperidol lactate  2 mg Intravenous Once   lisinopril  20 mg Oral Daily   nicotine  21 mg Transdermal Daily   ofloxacin  1 drop Left Eye QID   QUEtiapine  100 mg Oral QHS  Continuous Infusions:  vancomycin 1,500 mg (11/10/21 1015)    Diet Order             Diet regular Room service appropriate? Yes; Fluid consistency: Thin  Diet effective now                  Intake/Output Summary (Last 24 hours) at 11/10/2021 1117 Last data filed at 11/10/2021 0433 Gross per 24 hour  Intake 1100 ml  Output 200 ml  Net 900 ml   Net IO Since Admission: 800 mL [11/10/21 1117]  Wt Readings from Last 3 Encounters:  11/08/21 102 kg  10/01/21 99 kg  04/28/21 99.6 kg     Unresulted Labs (From admission, onward)      Start     Ordered   11/10/21 0500  Culture, blood (Routine X 2) w Reflex to ID Panel  BLOOD CULTURE X 2,   TIMED      11/09/21 0933   11/10/21 6195  Basic metabolic panel  Daily at 5am,   R     Question:  Specimen collection method  Answer:  Lab=Lab collect   11/09/21 1241   11/10/21 0500  CBC  Daily at 5am,   R     Question:  Specimen collection method  Answer:  Lab=Lab collect   11/09/21 1241          Data Reviewed: I have personally reviewed following labs and imaging studies CBC: Recent Labs  Lab 11/07/21 0916 11/08/21 0514 11/09/21 0545 11/10/21 1004  WBC 8.2 8.3 7.3 8.2  NEUTROABS  --  4.1  --   --   HGB 14.3 13.8 14.0 13.2  HCT 46.9 44.7 45.0 42.1  MCV 83.9 83.4 84.3 82.7  PLT 323 325 291 093   Basic Metabolic Panel: Recent Labs  Lab 11/07/21  4010 11/08/21 0514 11/09/21 0545 11/10/21 1004  NA 141 142  --  141  K 3.5 3.9  --  4.0  CL 111 108  --  109  CO2 24 27  --  23  GLUCOSE 152* 87  --  156*  BUN 9 10  --  13  CREATININE 0.99 0.85 0.96 1.00  CALCIUM 9.3 9.3  --  8.9   GFR: Estimated Creatinine Clearance: 99.8 mL/min (by C-G formula based on SCr of 1 mg/dL). Liver Function Tests: Recent Labs  Lab 11/07/21 0916 11/08/21 0514  AST 23 21  ALT 14 15  ALKPHOS 91 84  BILITOT 0.2* 0.4  PROT 7.3 7.6  ALBUMIN 3.5 3.8   No results for input(s): "LIPASE", "AMYLASE" in the last 168 hours. No results for input(s): "AMMONIA" in the last 168 hours. Coagulation Profile: Recent Labs  Lab 11/08/21 0514  INR 1.0   BNP (last 3 results) No results for input(s): "PROBNP" in the last 8760 hours. HbA1C: No results for input(s): "HGBA1C" in the last 72 hours. CBG: Recent Labs  Lab 11/08/21 0507  GLUCAP 82   Lipid Profile: No results for input(s): "CHOL", "HDL", "LDLCALC", "TRIG", "CHOLHDL", "LDLDIRECT" in the last 72 hours. Thyroid Function Tests: No results for input(s): "TSH", "T4TOTAL", "FREET4", "T3FREE", "THYROIDAB" in the last 72 hours. Sepsis  Labs: No results for input(s): "PROCALCITON", "LATICACIDVEN" in the last 168 hours.  Recent Results (from the past 240 hour(s))  Culture, blood (Routine X 2) w Reflex to ID Panel     Status: None (Preliminary result)   Collection Time: 11/08/21 10:07 AM   Specimen: BLOOD  Result Value Ref Range Status   Specimen Description   Final    BLOOD LEFT HAND Performed at [facility], [address], [place], [place] [address]    Special Requests   Final    BOTTLES DRAWN AEROBIC AND ANAEROBIC Blood Culture results may not be optimal due to an excessive volume of blood received in culture bottles Performed at [facility], [address] [place], [place] [address]    Culture  Setup Time   Final    Organism ID to follow [place] TO, READ BACK BY AND VERIFIED WITH: CARISSA DOLAN @ [id number] ON 11/09/2021.Marland KitchenMarland KitchenTKR GRAM STAIN REVIEWED-AGREE WITH RESULT    Culture   Final    GRAM POSITIVE COCCI CULTURE REINCUBATED FOR BETTER GROWTH Performed at [facility], [place] [address], [place], [place] [address]    Report Status PENDING  Incomplete  Culture, blood (Routine X 2) w Reflex to ID Panel     Status: Abnormal (Preliminary result)   Collection Time: 11/08/21 10:07 AM   Specimen: BLOOD  Result Value Ref Range Status   Specimen Description   Final    BLOOD RIGHT [facility] Performed at [facility], [address] [place], [place] [address]    Special Requests   Final    BOTTLES DRAWN AEROBIC AND ANAEROBIC Blood Culture results may not be optimal due to an excessive volume of blood received in culture bottles Performed at [facility], [address], [place], [place] [address]    Culture  Setup Time   Final    AEROBIC BOTTLE ONLY GRAM POSITIVE COCCI CRITICAL VALUE NOTED.  VALUE IS CONSISTENT WITH PREVIOUSLY REPORTED AND CALLED VALUE. GRAM STAIN REVIEWED-AGREE WITH RESULT    Culture (A)  Final     STAPHYLOCOCCUS AUREUS SUSCEPTIBILITIES TO FOLLOW  Performed at [facility], [place] [address] [place], [place] [address]    Report Status PENDING  Incomplete  Blood Culture ID Panel (Reflexed)     Status: Abnormal   Collection Time: 11/08/21 10:07 AM  Result Value Ref Range Status   Enterococcus faecalis NOT DETECTED NOT DETECTED Final   Enterococcus Faecium NOT DETECTED NOT DETECTED Final   Listeria monocytogenes NOT DETECTED NOT DETECTED Final   Staphylococcus species DETECTED (A) NOT DETECTED Final    Comment: CRITICAL RESULT CALLED TO, READ BACK BY AND VERIFIED WITH: CARISSA DOLAN @ [phone] ON 11/09/2021.Marland KitchenMarland KitchenTKR    Staphylococcus aureus (BCID) DETECTED (A) NOT DETECTED Final    Comment: Methicillin (oxacillin)-resistant Staphylococcus aureus (MRSA). MRSA is predictably resistant to beta-lactam antibiotics (except ceftaroline). Preferred therapy is vancomycin unless clinically contraindicated. Patient requires contact precautions if  hospitalized. CRITICAL RESULT CALLED TO, READ BACK BY AND VERIFIED WITH: CARISSA DOLAN @ [phone] ON 11/09/2021.Marland KitchenMarland KitchenTKR    Staphylococcus epidermidis DETECTED (A) NOT DETECTED Final    Comment: CRITICAL RESULT CALLED TO, READ BACK BY AND VERIFIED WITH: CARISSA DOLAN @ 1017 ON 11/09/2021.Marland KitchenMarland KitchenTKR    Staphylococcus lugdunensis NOT DETECTED NOT DETECTED Final   Streptococcus species DETECTED (A) NOT DETECTED Final    Comment: Not Enterococcus species, Streptococcus agalactiae, Streptococcus pyogenes, or Streptococcus pneumoniae. CRITICAL RESULT CALLED TO, READ BACK BY AND VERIFIED WITH: CARISSA DOLAN @ 5102 ON 11/09/2021.Marland KitchenMarland KitchenTKR    Streptococcus agalactiae NOT DETECTED NOT DETECTED Final   Streptococcus pneumoniae NOT DETECTED NOT DETECTED Final   Streptococcus pyogenes NOT DETECTED NOT DETECTED Final   A.calcoaceticus-baumannii NOT DETECTED NOT DETECTED Final   Bacteroides fragilis NOT DETECTED NOT DETECTED Final   Enterobacterales NOT DETECTED NOT DETECTED Final    Enterobacter cloacae complex NOT DETECTED NOT DETECTED Final   Escherichia coli NOT DETECTED NOT DETECTED Final   Klebsiella aerogenes NOT DETECTED NOT DETECTED Final   Klebsiella oxytoca NOT DETECTED NOT DETECTED Final   Klebsiella pneumoniae NOT DETECTED NOT DETECTED Final   Proteus species NOT DETECTED NOT DETECTED Final   Salmonella species NOT DETECTED NOT DETECTED Final   Serratia marcescens NOT DETECTED NOT DETECTED Final   Haemophilus influenzae NOT DETECTED NOT DETECTED Final   Neisseria meningitidis NOT DETECTED NOT DETECTED Final   Pseudomonas aeruginosa NOT DETECTED NOT DETECTED Final   Stenotrophomonas maltophilia NOT DETECTED NOT DETECTED Final   Candida albicans NOT DETECTED NOT DETECTED Final   Candida auris NOT DETECTED NOT DETECTED Final   Candida glabrata NOT DETECTED NOT DETECTED Final   Candida krusei NOT DETECTED NOT DETECTED Final   Candida parapsilosis NOT DETECTED NOT DETECTED Final   Candida tropicalis NOT DETECTED NOT DETECTED Final   Cryptococcus neoformans/gattii NOT DETECTED NOT DETECTED Final   Methicillin resistance mecA/C DETECTED (A) NOT DETECTED Final    Comment: CRITICAL RESULT CALLED TO, READ BACK BY AND VERIFIED WITH: CARISSA DOLAN @ 5852 ON 11/09/2021.Marland KitchenMarland KitchenTKR    Meth resistant mecA/C and MREJ DETECTED (A) NOT DETECTED Final    Comment: CRITICAL RESULT CALLED TO, READ BACK BY AND VERIFIED WITH: CARISSA DOLAN @ 7782 ON 11/09/2021.Marland KitchenMarland KitchenTKR Performed at [facility], [place], [place] [address]   Aerobic Culture w Gram Stain (superficial specimen)     Status: None (Preliminary result)   Collection Time: 11/09/21 12:39 PM   Specimen: Thigh; Wound  Result Value Ref Range Status   Specimen Description   Final    THIGH Performed at [facility], [address] [place], [place] [address]  Special Requests   Final    NONE Performed at [facility], [place], [place] [address]    Gram Stain NO  WBC SEEN RARE GRAM POSITIVE COCCI   Final   Culture   Final    FEW STAPHYLOCOCCUS AUREUS SUSCEPTIBILITIES TO FOLLOW Performed at [facility], [place] [address], [place], [place] [address]    Report Status PENDING  Incomplete    Antimicrobials: Anti-infectives (From admission, onward)    Start     Dose/Rate Route Frequency Ordered Stop   11/09/21 1200  cefTRIAXone (ROCEPHIN) 1 g in sodium chloride 0.9 % 100 mL IVPB  Status:  Discontinued        1 g 200 mL/hr over 30 Minutes Intravenous Every 24 hours 11/08/21 1457 11/09/21 0155   11/08/21 2200  vancomycin (VANCOREADY) IVPB 1500 mg/300 mL        1,500 mg 150 mL/hr over 120 Minutes Intravenous Every 12 hours 11/08/21 1459     11/08/21 1000  vancomycin (VANCOREADY) IVPB 2000 mg/400 mL        2,000 mg 200 mL/hr over 120 Minutes Intravenous  Once 11/08/21 0947 11/08/21 1256   11/08/21 0815  cefTRIAXone (ROCEPHIN) 1 g in sodium chloride 0.9 % 100 mL IVPB        1 g 200 mL/hr over 30 Minutes Intravenous  Once 11/08/21 0803 11/08/21 1057   11/08/21 0815  vancomycin (VANCOREADY) IVPB 1500 mg/300 mL  Status:  Discontinued        1,500 mg 150 mL/hr over 120 Minutes Intravenous  Once 11/08/21 0803 11/08/21 0947      Culture/Microbiology    Component Value Date/Time   SDES  11/09/2021 1239    THIGH Performed at [facility], [address], [place], [place] [address]    [facility]  11/09/2021 1239    NONE Performed at [facility], [address] [place], [place] [address]    CULT  11/09/2021 1239    FEW STAPHYLOCOCCUS AUREUS SUSCEPTIBILITIES TO FOLLOW Performed at [facility] [address], [place], [place] [address]    REPTSTATUS PENDING 11/09/2021 1239  Other culture-see note  Radiology Studies: No results found.   LOS: 1 day   Antonieta Pert, MD [facility]  11/10/2021, 11:17 AM

## 2021-11-10 NOTE — Progress Notes (Signed)
   11/09/21 2030  Mobility  HOB Elevated/Bed Position Self regulated  Activity Ambulated independently in room;Ambulated independently to bathroom  Range of Motion/Exercises Active;All extremities  Level of Assistance Independent  Assistive Device None  Distance Ambulated (ft) 50 ft  Activity Response Tolerated well

## 2021-11-10 NOTE — Progress Notes (Signed)
ID Pt c/o more pain left posterior thigh No fever  O/e awake No distress Patient Vitals for the past 24 hrs:  BP Temp Temp src Pulse Resp SpO2  11/10/21 2102 (!) 162/75 97.6 F (36.4 C) Oral 63 18 99 %  11/10/21 1547 (!) 149/71 98.2 F (36.8 C) -- 60 18 100 %  11/10/21 0841 (!) 141/58 98.4 F (36.9 C) Oral 60 18 97 %  11/10/21 0431 (!) 155/67 98 F (36.7 C) Oral 66 19 100 %    Chest b/l air entry Heart sound S1-S2 Abdomen soft Left thigh posterior aspect there is a soft tissue infection with central small opening with packing   Lab    Latest Ref Rng & Units 11/10/2021   10:04 AM 11/09/2021    5:45 AM 11/08/2021    5:14 AM  CBC  WBC 4.0 - 10.5 K/uL 8.2  7.3  8.3   Hemoglobin 13.0 - 17.0 g/dL 13.2  14.0  13.8   Hematocrit 39.0 - 52.0 % 42.1  45.0  44.7   Platelets 150 - 400 K/uL 267  291  325        Latest Ref Rng & Units 11/10/2021   10:04 AM 11/09/2021    5:45 AM 11/08/2021    5:14 AM  CMP  Glucose 70 - 99 mg/dL 156   87   BUN 6 - 20 mg/dL 13   10   Creatinine 0.61 - 1.24 mg/dL 1.00  0.96  0.85   Sodium 135 - 145 mmol/L 141   142   Potassium 3.5 - 5.1 mmol/L 4.0   3.9   Chloride 98 - 111 mmol/L 109   108   CO2 22 - 32 mmol/L 23   27   Calcium 8.9 - 10.3 mg/dL 8.9   9.3   Total Protein 6.5 - 8.1 g/dL   7.6   Total Bilirubin 0.3 - 1.2 mg/dL   0.4   Alkaline Phos 38 - 126 U/L   84   AST 15 - 41 U/L   21   ALT 0 - 44 U/L   15     Micro Blood culture 11/08/2021 MRSA in both sets There is also was Streptococcus and Staph epidermidis in 1 bottle and 1 set.  The Staph epidermidis is a contaminant  Impression/recommendation MRSA bacteremia Now it is present in both sets Patient has a soft tissue infection and that is also positive for Staph aureus Because of worsening pain and increased bioburden would recommend surgical consult to evaluate the infection of the soft tissue to see whether he needs formal surgery Continue vancomycin 2D echo done today. May need TEE  because of increased bioburden Repeat blood culture has been sent  Left leg numbness results TIA  Hypertensive emergency due to noncompliance.  Better managed now.  HLD  Tobacco use  Cocaine use  Discussed the management with the patient

## 2021-11-11 ENCOUNTER — Inpatient Hospital Stay: Admit: 2021-11-11 | Payer: 59

## 2021-11-11 DIAGNOSIS — R7881 Bacteremia: Secondary | ICD-10-CM | POA: Diagnosis not present

## 2021-11-11 DIAGNOSIS — L03116 Cellulitis of left lower limb: Secondary | ICD-10-CM | POA: Diagnosis not present

## 2021-11-11 DIAGNOSIS — L02416 Cutaneous abscess of left lower limb: Secondary | ICD-10-CM | POA: Diagnosis not present

## 2021-11-11 DIAGNOSIS — B9562 Methicillin resistant Staphylococcus aureus infection as the cause of diseases classified elsewhere: Secondary | ICD-10-CM | POA: Diagnosis not present

## 2021-11-11 DIAGNOSIS — Z1624 Resistance to multiple antibiotics: Secondary | ICD-10-CM

## 2021-11-11 DIAGNOSIS — A4902 Methicillin resistant Staphylococcus aureus infection, unspecified site: Secondary | ICD-10-CM | POA: Diagnosis not present

## 2021-11-11 LAB — CULTURE, BLOOD (ROUTINE X 2)

## 2021-11-11 LAB — BASIC METABOLIC PANEL
Anion gap: 8 (ref 5–15)
BUN: 10 mg/dL (ref 6–20)
CO2: 26 mmol/L (ref 22–32)
Calcium: 9.3 mg/dL (ref 8.9–10.3)
Chloride: 108 mmol/L (ref 98–111)
Creatinine, Ser: 0.94 mg/dL (ref 0.61–1.24)
GFR, Estimated: >60 mL/min (ref >60)
Glucose, Bld: 71 mg/dL (ref 70–99)
Potassium: 3.8 mmol/L (ref 3.5–5.1)
Sodium: 142 mmol/L (ref 135–145)

## 2021-11-11 LAB — URINE DRUG SCREEN, QUALITATIVE (ARMC ONLY)
Amphetamines, Ur Screen: NOT DETECTED
Barbiturates, Ur Screen: NOT DETECTED
Benzodiazepine, Ur Scrn: NOT DETECTED
Cannabinoid 50 Ng, Ur ~~LOC~~: NOT DETECTED
Cocaine Metabolite,Ur ~~LOC~~: NOT DETECTED
MDMA (Ecstasy)Ur Screen: NOT DETECTED
Methadone Scn, Ur: NOT DETECTED
Opiate, Ur Screen: NOT DETECTED
Phencyclidine (PCP) Ur S: NOT DETECTED
Tricyclic, Ur Screen: NOT DETECTED

## 2021-11-11 LAB — AEROBIC CULTURE W GRAM STAIN (SUPERFICIAL SPECIMEN): Gram Stain: NONE SEEN

## 2021-11-11 LAB — CBC
HCT: 47.9 % (ref 39.0–52.0)
Hemoglobin: 14.7 g/dL (ref 13.0–17.0)
MCH: 25.8 pg — ABNORMAL LOW (ref 26.0–34.0)
MCHC: 30.7 g/dL (ref 30.0–36.0)
MCV: 84.2 fL (ref 80.0–100.0)
Platelets: 319 10*3/uL (ref 150–400)
RBC: 5.69 MIL/uL (ref 4.22–5.81)
RDW: 14.6 % (ref 11.5–15.5)
WBC: 8.4 10*3/uL (ref 4.0–10.5)
nRBC: 0 % (ref 0.0–0.2)

## 2021-11-11 LAB — VANCOMYCIN, TROUGH: Vancomycin Tr: 12 ug/mL — ABNORMAL LOW (ref 15–20)

## 2021-11-11 NOTE — Progress Notes (Signed)
PROGRESS NOTE Samuel Molina  PJA:[id number] DOB: 22-Oct-1960 DOA: 11/08/2021 PCP: Pcp, No   Brief Narrative/Hospital Course: 89M, hx of HTN, HLD, TIA, GERD, multiple psych issue (depression, anxiety, personal disorder, malingering, adjustment disorder), tobacco abuse, cocaine abuse, Bell's palsy, presented with multiple complaints including left-sided weakness numbness, suicidal ideation and left leg wound. Patient seen in the ED underwent lab work essentially unremarkable chest x-ray no acute finding CT head dense right MCA bifurcation without hemorrhage MRI brain negative for acute stroke CTA head and neck negative for LVO, psychiatry consulted patient was admitted.   Subjective: Seen and examined this morning Complains of pain on the left thigh abscess area.  Reports echo tech was here earlier in the morning but he was not ready for scan  Assessment and Plan: Principal Problem:   Cellulitis and abscess of left leg Active Problems:   Left-sided weakness   Stroke ([place])   Hypertensive urgency   HLD (hyperlipidemia)   Adjustment disorder with mixed disturbance of emotions and conduct   Personality disorder ([place])   Depression with anxiety   Suicidal ideation   Cocaine abuse ([place])   Tobacco abuse   MRSA bacteremia   Streptococcal bacteremia   Left thigh abscess:S/p I&D by ED physician wound care consult on board, continue with antibiotics as below.  Wound culture MRSA ( sensitive to clindamycin, tetracycline)-requested general surgery to evaluate down to see patient next repeat I&D, continue pain control   MRSA bacteremia Streptococcal bacteremia: Source likely from thigh abscess from above.ID consult appreciated-continue vancomycin, follow-up echocardiogram pending and  repeat blood culture 7/20 pending.  If TTE negative will need TEE given MRSA bacteremia as per ID.  Recheck UDS last was positive on 7/18. Recent Labs  Lab 11/07/21 0916 11/08/21 0514 11/09/21 0545 11/10/21 1004  11/11/21 0857  WBC 8.2 8.3 7.3 8.2 8.4    Left-sided weakness and numbness in legs History of stroke: MRI-negative for stroke,CTA neg for LVO,TIA vs.Malingering.PT-OT neuro-checks. Cont home aspirin statin  Left eye blurring/redness-he feels overall better.  He has chronic droopy eyes on the left eye and the left face since his previous stroke.  Cont eye drops.   Hypertensive urgency: BP - 203/112 in ED. BP has stabilized at times high, continue with lisinopril 20 mg.  HLD: Continue Lipitor  Adjustment disorder with mixed disturbance of emotions and conduct Personality disorder Anxiety/depression Suicidal ideation-resolved: Seen by psychiatry continue home Celexa, Neurontin, Seroquel.  No need for IVC or inpatient psychiatric admission per psychiatry  Cocaine abuse :counseling about importance of quitting cocaine Tobacco abuse: cont- Nicotine patch  DVT prophylaxis: enoxaparin (LOVENOX) injection 40 mg Start: 11/08/21 1000 Code Status:   Code Status: Full Code Family Communication: plan of care discussed with patient at bedside. Patient status is: Inpatient for sepsis infection of the left thigh  Level of care: Med-Surg  Dispo: The patient is from: home            Anticipated disposition:  TBD  Mobility Assessment (last 72 hours)     Mobility Assessment     Row Name 11/10/21 2000 11/09/21 1905 11/09/21 0917 11/08/21 2030 11/08/21 1800   Does patient have an order for bedrest or is patient medically unstable No - Continue assessment No - Continue assessment No - Continue assessment No - Continue assessment No - Continue assessment   What is the highest level of mobility based on the progressive mobility assessment? Level 6 (Walks independently in room and hall) - Balance while walking in room without assist -  Complete Level 6 (Walks independently in room and hall) - Balance while walking in room without assist - Complete Level 6 (Walks independently in room and hall) - Balance  while walking in room without assist - Complete Level 6 (Walks independently in room and hall) - Balance while walking in room without assist - Complete Level 5 (Walks with assist in room/hall) - Balance while stepping forward/back and can walk in room with assist - Complete             Objective: Vitals last 24 hrs: Vitals:   11/10/21 1547 11/10/21 2102 11/11/21 0533 11/11/21 0811  BP: (!) 149/71 (!) 162/75 (!) 180/80 (!) 157/93  Pulse: 60 63 76 68  Resp: '18 18 18 18  '$ Temp: 98.2 F (36.8 C) 97.6 F (36.4 C) 97.9 F (36.6 C) 98.4 F (36.9 C)  TempSrc:  Oral    SpO2: 100% 99% 99% 97%  Weight:       Weight change:   Physical Examination: General exam: AAOX3, older than stated age, weak appearing. HEENT:Oral mucosa moist, Ear/Nose WNL grossly, dentition normal. Respiratory system: bilaterally diminished, no use of accessory muscle Cardiovascular system: S1 & S2 +, No JVD,. Gastrointestinal system: Abdomen soft,NT,ND,BS+ Nervous System:Alert, awake, moving extremities and grossly nonfocal Extremities: LE ankle edema neg-left posterior thigh with abscess.  Tender with dressing in place Skin: No rashes,no icterus. MSK: Normal muscle bulk,tone, power   Medications reviewed:  Scheduled Meds:  aspirin EC  81 mg Oral Daily   atorvastatin  80 mg Oral QPM   citalopram  10 mg Oral Daily   enoxaparin (LOVENOX) injection  40 mg Subcutaneous Q24H   famotidine  20 mg Oral BID   gabapentin  300 mg Oral TID   haloperidol lactate  2 mg Intravenous Once   lisinopril  20 mg Oral Daily   nicotine  21 mg Transdermal Daily   ofloxacin  1 drop Left Eye QID   QUEtiapine  100 mg Oral QHS  Continuous Infusions:  vancomycin 1,500 mg (11/11/21 1018)    Diet Order             Diet regular Room service appropriate? Yes; Fluid consistency: Thin  Diet effective now                 No intake or output data in the 24 hours ending 11/11/21 1049  Net IO Since Admission: 800 mL [11/11/21  1049]  Wt Readings from Last 3 Encounters:  11/08/21 102 kg  10/01/21 99 kg  04/28/21 99.6 kg     Unresulted Labs (From admission, onward)     Start     Ordered   11/12/21 0500  CBC  Daily at 5am,   R     Question:  Specimen collection method  Answer:  Lab=Lab collect   11/10/21 1550   11/12/21 1610  Basic metabolic panel  Daily at 5am,   R     Question:  Specimen collection method  Answer:  Lab=Lab collect   11/10/21 1550   11/11/21 0500  Urine Drug Screen, Qualitative ([place] only)  Tomorrow morning,   R        11/10/21 2141          Data Reviewed: I have personally reviewed following labs and imaging studies CBC: Recent Labs  Lab 11/07/21 0916 11/08/21 0514 11/09/21 0545 11/10/21 1004 11/11/21 0857  WBC 8.2 8.3 7.3 8.2 8.4  NEUTROABS  --  4.1  --   --   --  HGB 14.3 13.8 14.0 13.2 14.7  HCT 46.9 44.7 45.0 42.1 47.9  MCV 83.9 83.4 84.3 82.7 84.2  PLT 323 325 291 267 973   Basic Metabolic Panel: Recent Labs  Lab 11/07/21 0916 11/08/21 0514 11/09/21 0545 11/10/21 1004 11/11/21 0857  NA 141 142  --  141 142  K 3.5 3.9  --  4.0 3.8  CL 111 108  --  109 108  CO2 24 27  --  23 26  GLUCOSE 152* 87  --  156* 71  BUN 9 10  --  13 10  CREATININE 0.99 0.85 0.96 1.00 0.94  CALCIUM 9.3 9.3  --  8.9 9.3   GFR: Estimated Creatinine Clearance: 106.2 mL/min (by C-G formula based on SCr of 0.94 mg/dL). Liver Function Tests: Recent Labs  Lab 11/07/21 0916 11/08/21 0514  AST 23 21  ALT 14 15  ALKPHOS 91 84  BILITOT 0.2* 0.4  PROT 7.3 7.6  ALBUMIN 3.5 3.8   No results for input(s): "LIPASE", "AMYLASE" in the last 168 hours. No results for input(s): "AMMONIA" in the last 168 hours. Coagulation Profile: Recent Labs  Lab 11/08/21 0514  INR 1.0   BNP (last 3 results) No results for input(s): "PROBNP" in the last 8760 hours. HbA1C: No results for input(s): "HGBA1C" in the last 72 hours. CBG: Recent Labs  Lab 11/08/21 0507  GLUCAP 82   Lipid Profile: No  results for input(s): "CHOL", "HDL", "LDLCALC", "TRIG", "CHOLHDL", "LDLDIRECT" in the last 72 hours. Thyroid Function Tests: No results for input(s): "TSH", "T4TOTAL", "FREET4", "T3FREE", "THYROIDAB" in the last 72 hours. Sepsis Labs: No results for input(s): "PROCALCITON", "LATICACIDVEN" in the last 168 hours.  Recent Results (from the past 240 hour(s))  Culture, blood (Routine X 2) w Reflex to ID Panel     Status: Abnormal (Preliminary result)   Collection Time: 11/08/21 10:07 AM   Specimen: BLOOD  Result Value [place] Status   Specimen Description   Final    BLOOD LEFT HAND Performed at [facility], [address] [place], [place] [address]    Special Requests   Final    BOTTLES DRAWN AEROBIC AND ANAEROBIC Blood Culture results may not be optimal due to an excessive volume of blood received in culture bottles Performed at [facility], [address], [place], [place] [address]    Culture  Setup Time   Final    Organism ID to follow [place] TO, READ BACK BY AND VERIFIED WITH: CARISSA DOLAN @ [id number] ON 11/09/2021.Marland KitchenMarland KitchenTKR GRAM STAIN REVIEWED-AGREE WITH RESULT    Culture (A)  Final    STAPHYLOCOCCUS AUREUS CULTURE REINCUBATED FOR BETTER GROWTH Performed at [facility], [place] [address], [place],  [address]    Report Status PENDING  Incomplete  Culture, blood (Routine X 2) w Reflex to ID Panel     Status: Abnormal   Collection Time: 11/08/21 10:07 AM   Specimen: BLOOD  Result Value [place] Status   Specimen Description BLOOD RIGHT [facility]  Final   Special Requests   Final    BOTTLES DRAWN AEROBIC AND ANAEROBIC Blood Culture results may not be optimal due to an excessive volume of blood received in culture bottles   Culture  Setup Time   Final    AEROBIC BOTTLE ONLY GRAM POSITIVE COCCI CRITICAL VALUE NOTED.  VALUE IS CONSISTENT WITH PREVIOUSLY REPORTED AND CALLED VALUE. GRAM STAIN  REVIEWED-AGREE WITH RESULT  Culture STAPHYLOCOCCUS AUREUS (A)  Final   Report Status 11/11/2021 FINAL  Final   Organism ID, Bacteria STAPHYLOCOCCUS AUREUS  Final      Susceptibility   Staphylococcus aureus - MIC*    CIPROFLOXACIN <=0.5 SENSITIVE Sensitive     ERYTHROMYCIN >=8 RESISTANT Resistant     GENTAMICIN <=0.5 SENSITIVE Sensitive     OXACILLIN <=0.25 SENSITIVE Sensitive     TETRACYCLINE <=1 SENSITIVE Sensitive     VANCOMYCIN <=0.5 SENSITIVE Sensitive     TRIMETH/SULFA <=10 SENSITIVE Sensitive     CLINDAMYCIN RESISTANT Resistant     RIFAMPIN <=0.5 SENSITIVE Sensitive     Inducible Clindamycin POSITIVE Resistant     * STAPHYLOCOCCUS AUREUS  Blood Culture ID Panel (Reflexed)     Status: Abnormal   Collection Time: 11/08/21 10:07 AM  Result Value [place] Status   Enterococcus faecalis NOT DETECTED NOT DETECTED Final   Enterococcus Faecium NOT DETECTED NOT DETECTED Final   Listeria monocytogenes NOT DETECTED NOT DETECTED Final   Staphylococcus species DETECTED (A) NOT DETECTED Final    Comment: CRITICAL RESULT CALLED TO, READ BACK BY AND VERIFIED WITH: CARISSA DOLAN @ [id number] ON 11/09/2021.Marland KitchenMarland KitchenTKR    Staphylococcus aureus (BCID) DETECTED (A) NOT DETECTED Final    Comment: Methicillin (oxacillin)-resistant Staphylococcus aureus (MRSA). MRSA is predictably resistant to beta-lactam antibiotics (except ceftaroline). Preferred therapy is vancomycin unless clinically contraindicated. Patient requires contact precautions if  hospitalized. CRITICAL RESULT CALLED TO, READ BACK BY AND VERIFIED WITH: CARISSA DOLAN @ [id number] ON 11/09/2021.Marland KitchenMarland KitchenTKR    Staphylococcus epidermidis DETECTED (A) NOT DETECTED Final    Comment: CRITICAL RESULT CALLED TO, READ BACK BY AND VERIFIED WITH: CARISSA DOLAN @ [id number] ON 11/09/2021.Marland KitchenMarland KitchenTKR    Staphylococcus lugdunensis NOT DETECTED NOT DETECTED Final   Streptococcus species DETECTED (A) NOT DETECTED Final    Comment: Not Enterococcus species, Streptococcus agalactiae,  Streptococcus pyogenes, or Streptococcus pneumoniae. CRITICAL RESULT CALLED TO, READ BACK BY AND VERIFIED WITH: CARISSA DOLAN @ [id number] ON 11/09/2021.Marland KitchenMarland KitchenTKR    Streptococcus agalactiae NOT DETECTED NOT DETECTED Final   Streptococcus pneumoniae NOT DETECTED NOT DETECTED Final   Streptococcus pyogenes NOT DETECTED NOT DETECTED Final   A.calcoaceticus-baumannii NOT DETECTED NOT DETECTED Final   Bacteroides fragilis NOT DETECTED NOT DETECTED Final   Enterobacterales NOT DETECTED NOT DETECTED Final   Enterobacter cloacae complex NOT DETECTED NOT DETECTED Final   Escherichia coli NOT DETECTED NOT DETECTED Final   Klebsiella aerogenes NOT DETECTED NOT DETECTED Final   Klebsiella oxytoca NOT DETECTED NOT DETECTED Final   Klebsiella pneumoniae NOT DETECTED NOT DETECTED Final   Proteus species NOT DETECTED NOT DETECTED Final   Salmonella species NOT DETECTED NOT DETECTED Final   Serratia marcescens NOT DETECTED NOT DETECTED Final   Haemophilus influenzae NOT DETECTED NOT DETECTED Final   Neisseria meningitidis NOT DETECTED NOT DETECTED Final   Pseudomonas aeruginosa NOT DETECTED NOT DETECTED Final   Stenotrophomonas maltophilia NOT DETECTED NOT DETECTED Final   Candida albicans NOT DETECTED NOT DETECTED Final   Candida auris NOT DETECTED NOT DETECTED Final   Candida glabrata NOT DETECTED NOT DETECTED Final   Candida krusei NOT DETECTED NOT DETECTED Final   Candida parapsilosis NOT DETECTED NOT DETECTED Final   Candida tropicalis NOT DETECTED NOT DETECTED Final   Cryptococcus neoformans/gattii NOT DETECTED NOT DETECTED Final   Methicillin resistance mecA/C DETECTED (A) NOT DETECTED Final    Comment: CRITICAL RESULT CALLED TO, READ BACK BY AND VERIFIED WITH: CARISSA DOLAN @ [id number] ON 11/09/2021.Marland KitchenMarland KitchenTKR    Meth resistant  mecA/C and MREJ DETECTED (A) NOT DETECTED Final    Comment: CRITICAL RESULT CALLED TO, READ BACK BY AND VERIFIED WITH: CARISSA DOLAN @ [id number] ON 11/09/2021.Marland KitchenMarland KitchenTKR Performed at [facility], [place], [place] [address]   Aerobic Culture w Gram Stain (superficial specimen)     Status: None   Collection Time: 11/09/21 12:39 PM   Specimen: Thigh; Wound  Result Value [place] Status   Specimen Description   Final    THIGH Performed at [facility], [address], [place], [place] [address]    Special Requests   Final    NONE Performed at [facility], [address] [place], [place] [address]    Gram Stain   Final    NO WBC SEEN RARE GRAM POSITIVE COCCI Performed at [facility], [place] [address], [place], [place] [address]    Culture FEW METHICILLIN RESISTANT STAPHYLOCOCCUS AUREUS  Final   Report Status 11/11/2021 FINAL  Final   Organism ID, Bacteria METHICILLIN RESISTANT STAPHYLOCOCCUS AUREUS  Final      Susceptibility   Methicillin resistant staphylococcus aureus - MIC*    CIPROFLOXACIN >=8 RESISTANT Resistant     ERYTHROMYCIN >=8 RESISTANT Resistant     GENTAMICIN <=0.5 SENSITIVE Sensitive     OXACILLIN >=4 RESISTANT Resistant     TETRACYCLINE <=1 SENSITIVE Sensitive     VANCOMYCIN <=0.5 SENSITIVE Sensitive     TRIMETH/SULFA <=10 SENSITIVE Sensitive     CLINDAMYCIN <=0.25 SENSITIVE Sensitive     RIFAMPIN <=0.5 SENSITIVE Sensitive     Inducible Clindamycin NEGATIVE Sensitive     * FEW METHICILLIN RESISTANT STAPHYLOCOCCUS AUREUS  Culture, blood (Routine X 2) w Reflex to ID Panel     Status: None (Preliminary result)   Collection Time: 11/10/21 10:04 AM   Specimen: BLOOD  Result Value [place] Status   Specimen Description BLOOD LEFT AC  Final   Special Requests   Final    BOTTLES DRAWN AEROBIC AND ANAEROBIC Blood Culture adequate volume   Culture   Final    NO GROWTH < 24 HOURS Performed at [facility], [address], [place], [place] [address]    Report Status PENDING  Incomplete  Culture, blood (Routine X 2) w Reflex to ID Panel     Status: None (Preliminary result)   Collection Time:  11/10/21 10:04 AM   Specimen: BLOOD  Result Value [place] Status   Specimen Description BLOOD LEFT HAND  Final   Special Requests   Final    BOTTLES DRAWN AEROBIC AND ANAEROBIC Blood Culture adequate volume   Culture   Final    NO GROWTH < 24 HOURS Performed at [facility], [address] [place], [place] [address]    Report Status PENDING  Incomplete    Antimicrobials: Anti-infectives (From admission, onward)    Start     Dose/Rate Route Frequency Ordered Stop   11/09/21 1200  cefTRIAXone (ROCEPHIN) 1 g in sodium chloride 0.9 % 100 mL IVPB  Status:  Discontinued        1 g 200 mL/hr over 30 Minutes Intravenous Every 24 hours 11/08/21 1457 11/09/21 0155   11/08/21 2200  vancomycin (VANCOREADY) IVPB 1500 mg/300 mL        1,500 mg 150 mL/hr over 120 Minutes Intravenous Every 12 hours 11/08/21 1459     11/08/21 1000  vancomycin (VANCOREADY) IVPB 2000 mg/400 mL        2,000 mg 200  mL/hr over 120 Minutes Intravenous  Once 11/08/21 0947 11/08/21 1256   11/08/21 0815  cefTRIAXone (ROCEPHIN) 1 g in sodium chloride 0.9 % 100 mL IVPB        1 g 200 mL/hr over 30 Minutes Intravenous  Once 11/08/21 0803 11/08/21 1057   11/08/21 0815  vancomycin (VANCOREADY) IVPB 1500 mg/300 mL  Status:  Discontinued        1,500 mg 150 mL/hr over 120 Minutes Intravenous  Once 11/08/21 0803 11/08/21 0947      Culture/Microbiology    Component Value Date/Time   SDES BLOOD LEFT AC 11/10/2021 1004   SDES BLOOD LEFT HAND 11/10/2021 1004   SPECREQUEST  11/10/2021 1004    BOTTLES DRAWN AEROBIC AND ANAEROBIC Blood Culture adequate volume   SPECREQUEST  11/10/2021 1004    BOTTLES DRAWN AEROBIC AND ANAEROBIC Blood Culture adequate volume   CULT  11/10/2021 1004    NO GROWTH < 24 HOURS Performed at [facility], [address] [place], [place] [address]    CULT  11/10/2021 1004    NO GROWTH < 24 HOURS Performed at [facility], [address] [place], [place] [address]     REPTSTATUS PENDING 11/10/2021 1004   REPTSTATUS PENDING 11/10/2021 1004  Other culture-see note  Radiology Studies: No results found.   LOS: 2 days   Antonieta Pert, MD [facility]  11/11/2021, 10:49 AM

## 2021-11-11 NOTE — Progress Notes (Addendum)
ID Pt c/o more pain left posterior thigh No fever  O/e awake No distress Patient Vitals for the past 24 hrs:  BP Temp Temp src Pulse Resp SpO2  11/11/21 0811 (!) 157/93 98.4 F (36.9 C) -- 68 18 97 %  11/11/21 0533 (!) 180/80 97.9 F (36.6 C) -- 76 18 99 %  11/10/21 2102 (!) 162/75 97.6 F (36.4 C) Oral 63 18 99 %  11/10/21 1547 (!) 149/71 98.2 F (36.8 C) -- 60 18 100 %    Chest b/l air entry Heart sound S1-S2 Abdomen soft Left thigh posterior aspect there is a soft tissue infection with central small opening with packing   Lab    Latest Ref Rng & Units 11/11/2021    8:57 AM 11/10/2021   10:04 AM 11/09/2021    5:45 AM  CBC  WBC 4.0 - 10.5 K/uL 8.4  8.2  7.3   Hemoglobin 13.0 - 17.0 g/dL 14.7  13.2  14.0   Hematocrit 39.0 - 52.0 % 47.9  42.1  45.0   Platelets 150 - 400 K/uL 319  267  291        Latest Ref Rng & Units 11/11/2021    8:57 AM 11/10/2021   10:04 AM 11/09/2021    5:45 AM  CMP  Glucose 70 - 99 mg/dL 71  156    BUN 6 - 20 mg/dL 10  13    Creatinine 0.61 - 1.24 mg/dL 0.94  1.00  0.96   Sodium 135 - 145 mmol/L 142  141    Potassium 3.5 - 5.1 mmol/L 3.8  4.0    Chloride 98 - 111 mmol/L 108  109    CO2 22 - 32 mmol/L 26  23    Calcium 8.9 - 10.3 mg/dL 9.3  8.9      Micro Blood culture 11/08/2021 Reported MSSA  There is also was Streptococcus and Staph epidermidis in 1 bottle and 1 set.  The Staph epidermidis is a contaminant  Impression/recommendation MRSA bacteremia or is it MSSa bacteremia ( as reported by lab) - some confusion with Lab ID wound culture MRSA Asked lab to check the susceptibility again Continue vancomycin ( Also there is strep and coag neg staph in blood culture- they could all be contaminants)  Patient has a soft tissue infection and that is also positive for Staph aureus Because of worsening pain and increased bioburden would recommend surgical consult to evaluate the infection of the soft tissue to see whether he needs formal  surgery Continue vancomycin 2D echo  today May need TEE  Repeat blood culture has been sent  Left leg numbness r- resolved- TIA  Hypertensive emergency due to noncompliance.  Better managed now.  HLD  Tobacco use  Cocaine use  Discussed the management with the patient and the hospitalist ID will follow him peripherally this weekend- call if needed

## 2021-11-11 NOTE — Consult Note (Signed)
Patient ID: Samuel Molina, male   DOB: Mar 22, 1961, 61 y.o.   MRN: [id number]  HPI Samuel Molina is a 61 y.o. male in consultation at the request of Dr. Delaine Lame.He was admnitted 3 days ago for numbness on his leg. He haswith a history of  HTN, CVAof substance use ( cocaine) , cva, anxiety Presented with left sided weakness. He developed sudden numbness left leg. Had minimal arm weakness. HE had a cva 2 years ago and has had TIA s He had an abscess loeft loer extremity and thought it was a spider bite- he had come to the ED a day befor efor that and EDP did I/D and put him on amoxicillin and doxy Blood culture sent this presentaiton is positive for MRSA  strep. HE feels pain in posterior thigh that is sharp but not necessarily worsening.  No fevers no chills. He is currently on vancomycin.  CTA head normal MRI brain no acute infarct Urine tox positive for cocaine   HPI  Past Medical History:  Diagnosis Date   Anxiety    Depression    GERD (gastroesophageal reflux disease)    Obesity    Stroke [facility])    Substance abuse ([place])     Past Surgical History:  Procedure Laterality Date   ESOPHAGOGASTRODUODENOSCOPY (EGD) WITH PROPOFOL N/A 09/20/2017   Procedure: ESOPHAGOGASTRODUODENOSCOPY (EGD) WITH PROPOFOL;  Surgeon: Lin Landsman, MD;  Location: [facility] ENDOSCOPY;  Service: Gastroenterology;  Laterality: N/A;   KNEE SURGERY Right     Family History  Problem Relation Age of Onset   Vasculitis Mother     Social History Social History   Tobacco Use   Smoking status: Every Day    Packs/day: 0.50    Types: Cigarettes   Smokeless tobacco: Never  Vaping Use   Vaping Use: Never used  Substance Use Topics   Alcohol use: Not Currently   Drug use: Yes    Types: Cocaine    Comment: used two days ago    Allergies  Allergen Reactions   Acetaminophen Itching, Nausea And Vomiting and Rash    Current Facility-Administered Medications  Medication Dose Route Frequency  Provider Last Rate Last Admin   aspirin EC tablet 81 mg  81 mg Oral Daily Ivor Costa, MD   81 mg at 11/11/21 1013   atorvastatin (LIPITOR) tablet 80 mg  80 mg Oral QPM Ivor Costa, MD   80 mg at 11/10/21 1701   citalopram (CELEXA) tablet 10 mg  10 mg Oral Daily Ivor Costa, MD   10 mg at 11/11/21 1012   enoxaparin (LOVENOX) injection 40 mg  40 mg Subcutaneous Q24H Ivor Costa, MD   40 mg at 11/11/21 1013   famotidine (PEPCID) tablet 20 mg  20 mg Oral BID Ivor Costa, MD   20 mg at 11/11/21 1013   gabapentin (NEURONTIN) capsule 300 mg  300 mg Oral TID Ivor Costa, MD   300 mg at 11/11/21 1013   haloperidol lactate (HALDOL) injection 2 mg  2 mg Intravenous Once Ward, Kristen N, DO       hydrALAZINE (APRESOLINE) injection 5 mg  5 mg Intravenous Q2H PRN Ivor Costa, MD       ibuprofen (ADVIL) tablet 400 mg  400 mg Oral Q6H PRN Ivor Costa, MD   400 mg at 11/09/21 0909   ketorolac (TORADOL) 30 MG/ML injection 30 mg  30 mg Intravenous Q6H PRN Ivor Costa, MD   30 mg at 11/11/21 1230   lisinopril (ZESTRIL) tablet 20  mg  20 mg Oral Daily Ivor Costa, MD   20 mg at 11/11/21 1012   LORazepam (ATIVAN) injection 1 mg  1 mg Intravenous Q8H PRN Ivor Costa, MD       methocarbamol (ROBAXIN) tablet 500 mg  500 mg Oral Q8H PRN Kc, Ramesh, MD   500 mg at 11/10/21 2247   nicotine (NICODERM CQ - dosed in mg/24 hours) patch 21 mg  21 mg Transdermal Daily Ivor Costa, MD       ofloxacin (OCUFLOX) 0.3 % ophthalmic solution 1 drop  1 drop Left Eye QID Kc, Ramesh, MD   1 drop at 11/11/21 1014   ondansetron (ZOFRAN) injection 4 mg  4 mg Intravenous Q8H PRN Ivor Costa, MD       QUEtiapine (SEROQUEL) tablet 100 mg  100 mg Oral QHS Ivor Costa, MD   100 mg at 11/10/21 2247   vancomycin (VANCOREADY) IVPB 1500 mg/300 mL  1,500 mg Intravenous Q12H Dallie Piles, RPH 150 mL/hr at 11/11/21 1018 1,500 mg at 11/11/21 1018     Review of Systems Full ROS  was asked and was negative except for the information on the HPI  Physical  Exam Blood pressure (!) 157/93, pulse 68, temperature 98.4 F (36.9 C), resp. rate 18, weight 102 kg, SpO2 97 %. CONSTITUTIONAL: NAD. EYES: Pupils are equal, round, , Sclera are non-icteric. EARS, NOSE, MOUTH AND THROAT:  The oral mucosa is pink and moist. Hearing is intact to voice. LYMPH NODES:  Lymph nodes in the neck are normal. RESPIRATORY:  Lungs are clear. There is normal respiratory effort, with equal breath sounds bilaterally, and without pathologic use of accessory muscles. CARDIOVASCULAR: Heart is regular without murmurs, gallops, or rubs. GI: The abdomen is  soft, nontender, and nondistended. There are no palpable masses. There is no hepatosplenomegaly. There are normal bowel sounds in all quadrants. GU: Rectal deferred.   MUSCULOSKELETAL: There is evidence of a open wound from a prior I&D posterior left thigh.  There is minimal induration and there is no evidence of necrotizing infection or undrained collections.  SKIN: Turgor is good and there are no pathologic skin lesions or ulcers. NEUROLOGIC: Motor and sensation is grossly normal. Cranial nerves are grossly intact. PSYCH:  Oriented to person, place and time. Affect is normal.  Data Reviewed Lab     Latest Ref Rng & Units 11/10/2021   10:04 AM 11/09/2021    5:45 AM 11/08/2021    5:14 AM  CBC  WBC 4.0 - 10.5 K/uL 8.2  7.3  8.3   Hemoglobin 13.0 - 17.0 g/dL 13.2  14.0  13.8   Hematocrit 39.0 - 52.0 % 42.1  45.0  44.7   Platelets 150 - 400 K/uL 267  291  325         Latest Ref Rng & Units 11/10/2021   10:04 AM 11/09/2021    5:45 AM 11/08/2021    5:14 AM  CMP  Glucose 70 - 99 mg/dL 156    87   BUN 6 - 20 mg/dL 13    10   Creatinine 0.61 - 1.24 mg/dL 1.00  0.96  0.85   Sodium 135 - 145 mmol/L 141    142   Potassium 3.5 - 5.1 mmol/L 4.0    3.9   Chloride 98 - 111 mmol/L 109    108   CO2 22 - 32 mmol/L 23    27   Calcium 8.9 - 10.3 mg/dL 8.9    9.3  Total Protein 6.5 - 8.1 g/dL     7.6   Total Bilirubin 0.3 - 1.2  mg/dL     0.4   Alkaline Phos 38 - 126 U/L     84   AST 15 - 41 U/L     21   ALT 0 - 44 U/L     15    I have personally reviewed the patient's imaging, laboratory findings and medical records.    Assessment/Plan 61 year old male with MRSA infection and resolving abscess  of the left thigh.  No need surgical intervention or debridement.  No evidence of necrotizing infection or undrained collections.  Recommend to continue appropriate antibiotics  and wound care. I spent 55 minutes in this encounter including coordination of his care, counseling the patient and performing appropriate mentation   Caroleen Hamman, MD [place] Surgeon 11/11/2021, 3:39 PM

## 2021-11-11 NOTE — Progress Notes (Signed)
Pharmacy Antibiotic Note  Samuel Molina is a 61 y.o. male w/ PMH of HTN, stroke and functional left leg weakness admitted on 11/08/2021 with bacteremia related to cellulitis.  Pharmacy has been consulted for vancomycin dosing. EDP drained leg abscess but no culture sent.  Today, 11/11/2021 Renal: SCr stable WBC WNL Afebrile 7/18 Bcx 2/2 S aureus - Meth-susceptible 7/19 Thigh: MRSA 7/20 Bcx NGTD  Vancomycin level 7/21 (Note - patient refused vancomycin peak at 0200 7/21 Vancomycin '1500mg'$  IV q12h @ 22:47 Vancomycin trough = 12 mcg/ml @ 4782  Plan:   Based on trough today, vancomycin 1500 mg IV every 12 hours  Unable to calculate AUC as patient refused peak but he looks to be matching the calculated numbers below.   Goal AUC 400-550. Expected AUC: 443.2 SCr used: 0.85 mg/dL Ke: 0.092 h-1, T1/2 7.5 h  Monitor renal functon  F/u antibiotic plan with MSSA in blood cx and MRSA in wound cx   Weight: 102 kg (224 lb 13.9 oz)  Temp (24hrs), Avg:98 F (36.7 C), Min:97.6 F (36.4 C), Max:98.4 F (36.9 C)  Recent Labs  Lab 11/07/21 0916 11/08/21 0514 11/09/21 0545 11/10/21 1004 11/11/21 0857  WBC 8.2 8.3 7.3 8.2 8.4  CREATININE 0.99 0.85 0.96 1.00 0.94  VANCOTROUGH  --   --   --   --  12*     Estimated Creatinine Clearance: 106.2 mL/min (by C-G formula based on SCr of 0.94 mg/dL).    Allergies  Allergen Reactions   Acetaminophen Itching, Nausea And Vomiting and Rash    Antimicrobials this admission: 07/18 vancomycin x1 07/18 ceftriaxone >>   Microbiology results: 7/18 Bcx 2/2 S aureus - Meth-susceptible 7/19 Thigh: MRSA 7/20 Bcx NGTD  Thank you for allowing pharmacy to be a part of this patient's care.  Doreene Eland, PharmD, BCPS, BCIDP Work Cell: [phone] 11/11/2021 11:39 AM

## 2021-11-12 ENCOUNTER — Other Ambulatory Visit: Payer: Self-pay

## 2021-11-12 LAB — CBC
HCT: 43.2 % (ref 39.0–52.0)
Hemoglobin: 13.3 g/dL (ref 13.0–17.0)
MCH: 25.6 pg — ABNORMAL LOW (ref 26.0–34.0)
MCHC: 30.8 g/dL (ref 30.0–36.0)
MCV: 83.1 fL (ref 80.0–100.0)
Platelets: 299 10*3/uL (ref 150–400)
RBC: 5.2 MIL/uL (ref 4.22–5.81)
RDW: 14.6 % (ref 11.5–15.5)
WBC: 10.2 10*3/uL (ref 4.0–10.5)
nRBC: 0 % (ref 0.0–0.2)

## 2021-11-12 LAB — BASIC METABOLIC PANEL
Anion gap: 7 (ref 5–15)
BUN: 12 mg/dL (ref 6–20)
CO2: 23 mmol/L (ref 22–32)
Calcium: 8.8 mg/dL — ABNORMAL LOW (ref 8.9–10.3)
Chloride: 111 mmol/L (ref 98–111)
Creatinine, Ser: 0.97 mg/dL (ref 0.61–1.24)
GFR, Estimated: >60 mL/min (ref >60)
Glucose, Bld: 136 mg/dL — ABNORMAL HIGH (ref 70–99)
Potassium: 3.7 mmol/L (ref 3.5–5.1)
Sodium: 141 mmol/L (ref 135–145)

## 2021-11-12 LAB — TROPONIN I (HIGH SENSITIVITY)
Troponin I (High Sensitivity): 20 ng/L — ABNORMAL HIGH
Troponin I (High Sensitivity): 20 ng/L — ABNORMAL HIGH (ref <18)

## 2021-11-12 MED ORDER — ALUM & MAG HYDROXIDE-SIMETH 200-200-20 MG/5ML PO SUSP: 30.0000 mL | Freq: Four times a day (QID) | ORAL | Status: DC | PRN

## 2021-11-12 MED ORDER — LISINOPRIL 20 MG PO TABS
40.0000 mg | ORAL_TABLET | Freq: Every day | ORAL | Status: DC
  Administered 2021-11-12 – 2021-11-24 (×13): 40 mg via ORAL
  Filled 2021-11-12 (×13): qty 2

## 2021-11-12 MED ORDER — PANTOPRAZOLE SODIUM 40 MG PO TBEC
40.0000 mg | DELAYED_RELEASE_TABLET | Freq: Every day | ORAL | Status: DC
  Administered 2021-11-12 – 2021-11-22 (×11): 40 mg via ORAL
  Filled 2021-11-12 (×11): qty 1

## 2021-11-12 NOTE — Progress Notes (Signed)
   [facility] has been requested to perform a transesophageal echocardiogram on Samuel Molina for bacteremia.  After careful review of history and examination, the risks and benefits of transesophageal echocardiogram have been explained including risks of esophageal damage, perforation (1:10,000 risk), bleeding, pharyngeal hematoma as well as other potential complications associated with conscious sedation including aspiration, arrhythmia, respiratory failure and death. Alternatives to treatment were discussed, questions were answered. Patient is willing to proceed.   The patient requested I call and speak to his sister, Daran Favaro, but I was unable to contact her. Phone: [phone].  Kylon Philbrook Ninfa Meeker, PA-C  11/12/2021 11:53 AM

## 2021-11-12 NOTE — Progress Notes (Signed)
PROGRESS NOTE Samuel Molina  ZWC:[id number] DOB: 06-20-1960 DOA: 11/08/2021 PCP: Pcp, No   Brief Narrative/Hospital Course: 68M, hx of HTN, HLD, TIA, GERD, multiple psych issue (depression, anxiety, personal disorder, malingering, adjustment disorder), tobacco abuse, cocaine abuse, Bell's palsy, presented with multiple complaints including left-sided weakness numbness, suicidal ideation and left leg wound. Patient seen in the ED underwent lab work essentially unremarkable chest x-ray no acute finding CT head dense right MCA bifurcation without hemorrhage MRI brain negative for acute stroke CTA head and neck negative for LVO, psychiatry consulted patient was admitted.   Subjective: Seen and examined this morning. C/o some chest pain, voice discomfort was c/o heartburn and throat pain yesterday Some pain on left thigh  Assessment and Plan: Principal Problem:   Cellulitis and abscess of left leg Active Problems:   Left-sided weakness   Stroke ([place])   Hypertensive urgency   HLD (hyperlipidemia)   Adjustment disorder with mixed disturbance of emotions and conduct   Personality disorder (HCC)   Depression with anxiety   Suicidal ideation   Cocaine abuse ([place])   Tobacco abuse   MRSA bacteremia   Streptococcal bacteremia   Left thigh abscess:S/p I&D by ED physician wound care consult on board, continue with antibiotics as below.  Wound culture MRSA ( sensitive to clindamycin, tetracycline)-requested general surgery to evaluate given complaint of more pain on the left thigh, no indication for I&D but will be reevaluated again 11/12/21.  Continue wound care pain control.   MRSA bacteremia Streptococcal bacteremia: Source from thigh abscess from above.ID on board.Awaiting TTE if negative will need TEE as per ID. Repeat blood culture 7/20  NGTD. Continue vancomycin, continue source control with wound care. Repeat UDS neg 7/21. 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 BP has stabilized at times high-borderline controlled increase lisinopril to 40 mg HLD: Continue Lipitor Mild chest discomfort/throat pain/heart burn: symptoms feels like gi related or HTN related change pepcid to protonix, add maalox check ekg done-ST-T changes in inferior leads, ordered troponins.  Increasing his lisinopril to optimize hypertension  Adjustment disorder with mixed disturbance of emotions and conduct Personality disorder Anxiety/depression Suicidal ideation-resolved: Seen by psychiatry-stable, continue home Celexa, Neurontin, Seroquel.  No need for IVC or inpatient psychiatric admission per psychiatry  Cocaine abuse :counseling about importance of quitting cocaine Tobacco abuse: cont- Nicotine patch  DVT prophylaxis: enoxaparin (LOVENOX) injection 40 mg Start: 11/08/21 1000 Code Status:   Code Status: Full Code Family Communication: plan of care discussed with patient at bedside. Patient status is: Inpatient for sepsis infection of the left thigh  Level of care: Med-Surg  Dispo: The patient is from: home            Anticipated disposition:  TBD  Mobility Assessment (last 72 hours)     Mobility Assessment     Row Name 11/10/21 2000 11/09/21 1905         Does patient have an order for bedrest or is patient medically unstable No - Continue assessment No - Continue assessment      What is the highest level of mobility based on the progressive mobility assessment? Level 6 (Walks independently in room and hall) - Balance while walking  in room without assist - Complete Level 6 (Walks independently in room and hall) - Balance while walking in room without assist - Complete                 Objective: Vitals last 24 hrs: Vitals:   11/11/21 1622 11/11/21 2107 11/12/21 0513 11/12/21 0918  BP: (!) 156/70 (!) 186/80 (!) 150/72 (!) 171/85  Pulse: 70 67 65 64  Resp: '18 18 18 20  '$ Temp: 98.6 F (37 C) 97.9 F (36.6 C) 97.8 F (36.6 C) 98.2 F (36.8 C)  TempSrc: Oral Oral Oral Oral  SpO2: 96% 100% 97% 100%  Weight:       Weight change:   Physical Examination: General exam: AAox3, older than stated age, weak appearing. HEENT:Oral mucosa moist, Ear/Nose WNL grossly, dentition normal. Respiratory system: bilaterally diminished, no use of accessory muscle Cardiovascular system: S1 & S2 +, No JVD,. Gastrointestinal system: Abdomen soft, mildy tender epigastric Nervous System:Alert, awake, left facial and left eye droopiness present Extremities: LE ankle edema, left post thigh wound w/ dressing  Skin: No rashes,no icterus. MSK: Normal muscle bulk,tone, power   Medications reviewed:  Scheduled Meds:  aspirin EC  81 mg Oral Daily   atorvastatin  80 mg Oral QPM   citalopram  10 mg Oral Daily   enoxaparin (LOVENOX) injection  40 mg Subcutaneous Q24H   gabapentin  300 mg Oral TID   haloperidol lactate  2 mg Intravenous Once   lisinopril  40 mg Oral Daily   nicotine  21 mg Transdermal Daily   ofloxacin  1 drop Left Eye QID   pantoprazole  40 mg Oral Daily   QUEtiapine  100 mg Oral QHS  Continuous Infusions:  vancomycin 1,500 mg (11/12/21 0945)    Diet Order             Diet regular Room service appropriate? Yes; Fluid consistency: Thin  Diet effective now                  Intake/Output Summary (Last 24 hours) at 11/12/2021 1004 Last data filed at 11/11/2021 2327 Gross per 24 hour  Intake 300 ml  Output --  Net 300 ml    Net IO Since Admission: 1,100 mL [11/12/21 1004]  Wt Readings from Last 3 Encounters:  11/08/21 102 kg  10/01/21 99 kg  04/28/21 99.6 kg     Unresulted Labs (From admission, onward)     Start     Ordered   11/12/21 0500   CBC  Daily at 5am,   R     Question:  Specimen collection method  Answer:  Lab=Lab collect   11/10/21 1550   11/12/21 1610  Basic metabolic panel  Daily at 5am,   R     Question:  Specimen collection method  Answer:  Lab=Lab collect   11/10/21 1550          Data Reviewed: I have personally reviewed following labs and imaging studies CBC: Recent Labs  Lab 11/08/21 0514 11/09/21 0545 11/10/21 1004 11/11/21 0857 11/12/21 0615  WBC 8.3 7.3 8.2 8.4 10.2  NEUTROABS 4.1  --   --   --   --   HGB 13.8 14.0 13.2 14.7 13.3  HCT 44.7 45.0 42.1 47.9 43.2  MCV 83.4 84.3 82.7 84.2 83.1  PLT 325 291 267 319 960   Basic Metabolic Panel: Recent Labs  Lab 11/07/21 0916 11/08/21 0514 11/09/21 0545 11/10/21 1004 11/11/21 0857 11/12/21 0615  NA 141  142  --  141 142 141  K 3.5 3.9  --  4.0 3.8 3.7  CL 111 108  --  109 108 111  CO2 24 27  --  '23 26 23  '$ GLUCOSE 152* 87  --  156* 71 136*  BUN 9 10  --  '13 10 12  '$ CREATININE 0.99 0.85 0.96 1.00 0.94 0.97  CALCIUM 9.3 9.3  --  8.9 9.3 8.8*   GFR: Estimated Creatinine Clearance: 102.9 mL/min (by C-G formula based on SCr of 0.97 mg/dL). Liver Function Tests: Recent Labs  Lab 11/07/21 0916 11/08/21 0514  AST 23 21  ALT 14 15  ALKPHOS 91 84  BILITOT 0.2* 0.4  PROT 7.3 7.6  ALBUMIN 3.5 3.8   No results for input(s): "LIPASE", "AMYLASE" in the last 168 hours. No results for input(s): "AMMONIA" in the last 168 hours. Coagulation Profile: Recent Labs  Lab 11/08/21 0514  INR 1.0   BNP (last 3 results) No results for input(s): "PROBNP" in the last 8760 hours. HbA1C: No results for input(s): "HGBA1C" in the last 72 hours. CBG: Recent Labs  Lab 11/08/21 0507  GLUCAP 82   Lipid Profile: No results for input(s): "CHOL", "HDL", "LDLCALC", "TRIG", "CHOLHDL", "LDLDIRECT" in the last 72 hours. Thyroid Function Tests: No results for input(s): "TSH", "T4TOTAL", "FREET4", "T3FREE", "THYROIDAB" in the last 72 hours. Sepsis Labs: No  results for input(s): "PROCALCITON", "LATICACIDVEN" in the last 168 hours.  Recent Results (from the past 240 hour(s))  Culture, blood (Routine X 2) w Reflex to ID Panel     Status: Abnormal (Preliminary result)   Collection Time: 11/08/21 10:07 AM   Specimen: BLOOD  Result Value Ref Range Status   Specimen Description   Final    BLOOD LEFT HAND Performed at [facility], [address] [place], [place] [address]    Special Requests   Final    BOTTLES DRAWN AEROBIC AND ANAEROBIC Blood Culture results may not be optimal due to an excessive volume of blood received in culture bottles Performed at [facility], [address] [place], [place] [address]    Culture  Setup Time   Final    AEROBIC BOTTLE ONLY GRAM POSITIVE COCCI CRITICAL RESULT CALLED TO, READ BACK BY AND VERIFIED WITH: CARISSA DOLAN @ [phone] ON 11/09/2021.Marland KitchenMarland KitchenTKR GRAM STAIN REVIEWED-AGREE WITH RESULT    Culture (A)  Final    STAPHYLOCOCCUS AUREUS CULTURE REINCUBATED FOR BETTER GROWTH Performed at [facility], [place] [address], [place], [place] [address]    Report Status PENDING  Incomplete  Culture, blood (Routine X 2) w Reflex to ID Panel     Status: Abnormal   Collection Time: 11/08/21 10:07 AM   Specimen: BLOOD  Result Value Ref Range Status   Specimen Description BLOOD RIGHT [facility]  Final   Special Requests   Final    BOTTLES DRAWN AEROBIC AND ANAEROBIC Blood Culture results may not be optimal due to an excessive volume of blood received in culture bottles   Culture  Setup Time   Final    AEROBIC BOTTLE ONLY GRAM POSITIVE COCCI CRITICAL VALUE NOTED.  VALUE IS CONSISTENT WITH PREVIOUSLY REPORTED AND CALLED VALUE. GRAM STAIN REVIEWED-AGREE WITH RESULT    Culture STAPHYLOCOCCUS AUREUS (A)  Final   Report Status 11/11/2021 FINAL  Final   Organism ID, Bacteria STAPHYLOCOCCUS AUREUS  Final      Susceptibility   Staphylococcus aureus - MIC*    CIPROFLOXACIN <=0.5 SENSITIVE Sensitive  ERYTHROMYCIN >=8 RESISTANT Resistant     GENTAMICIN <=0.5 SENSITIVE Sensitive     OXACILLIN <=0.25 SENSITIVE Sensitive     TETRACYCLINE <=1 SENSITIVE Sensitive     VANCOMYCIN <=0.5 SENSITIVE Sensitive     TRIMETH/SULFA <=10 SENSITIVE Sensitive     CLINDAMYCIN RESISTANT Resistant     RIFAMPIN <=0.5 SENSITIVE Sensitive     Inducible Clindamycin POSITIVE Resistant     * STAPHYLOCOCCUS AUREUS  Blood Culture ID Panel (Reflexed)     Status: Abnormal   Collection Time: 11/08/21 10:07 AM  Result Value Ref Range Status   Enterococcus faecalis NOT DETECTED NOT DETECTED Final   Enterococcus Faecium NOT DETECTED NOT DETECTED Final   Listeria monocytogenes NOT DETECTED NOT DETECTED Final   Staphylococcus species DETECTED (A) NOT DETECTED Final    Comment: CRITICAL RESULT CALLED TO, READ BACK BY AND VERIFIED WITH: CARISSA DOLAN @ 4132 ON 11/09/2021.Marland KitchenMarland KitchenTKR    Staphylococcus aureus (BCID) DETECTED (A) NOT DETECTED Final    Comment: Methicillin (oxacillin)-resistant Staphylococcus aureus (MRSA). MRSA is predictably resistant to beta-lactam antibiotics (except ceftaroline). Preferred therapy is vancomycin unless clinically contraindicated. Patient requires contact precautions if  hospitalized. CRITICAL RESULT CALLED TO, READ BACK BY AND VERIFIED WITH: CARISSA DOLAN @ 4401 ON 11/09/2021.Marland KitchenMarland KitchenTKR    Staphylococcus epidermidis DETECTED (A) NOT DETECTED Final    Comment: CRITICAL RESULT CALLED TO, READ BACK BY AND VERIFIED WITH: CARISSA DOLAN @ 0272 ON 11/09/2021.Marland KitchenMarland KitchenTKR    Staphylococcus lugdunensis NOT DETECTED NOT DETECTED Final   Streptococcus species DETECTED (A) NOT DETECTED Final    Comment: Not Enterococcus species, Streptococcus agalactiae, Streptococcus pyogenes, or Streptococcus pneumoniae. CRITICAL RESULT CALLED TO, READ BACK BY AND VERIFIED WITH: CARISSA DOLAN @ 5366 ON 11/09/2021.Marland KitchenMarland KitchenTKR    Streptococcus agalactiae NOT DETECTED NOT DETECTED Final   Streptococcus pneumoniae NOT DETECTED NOT DETECTED  Final   Streptococcus pyogenes NOT DETECTED NOT DETECTED Final   A.calcoaceticus-baumannii NOT DETECTED NOT DETECTED Final   Bacteroides fragilis NOT DETECTED NOT DETECTED Final   Enterobacterales NOT DETECTED NOT DETECTED Final   Enterobacter cloacae complex NOT DETECTED NOT DETECTED Final   Escherichia coli NOT DETECTED NOT DETECTED Final   Klebsiella aerogenes NOT DETECTED NOT DETECTED Final   Klebsiella oxytoca NOT DETECTED NOT DETECTED Final   Klebsiella pneumoniae NOT DETECTED NOT DETECTED Final   Proteus species NOT DETECTED NOT DETECTED Final   Salmonella species NOT DETECTED NOT DETECTED Final   Serratia marcescens NOT DETECTED NOT DETECTED Final   Haemophilus influenzae NOT DETECTED NOT DETECTED Final   Neisseria meningitidis NOT DETECTED NOT DETECTED Final   Pseudomonas aeruginosa NOT DETECTED NOT DETECTED Final   Stenotrophomonas maltophilia NOT DETECTED NOT DETECTED Final   Candida albicans NOT DETECTED NOT DETECTED Final   Candida auris NOT DETECTED NOT DETECTED Final   Candida glabrata NOT DETECTED NOT DETECTED Final   Candida krusei NOT DETECTED NOT DETECTED Final   Candida parapsilosis NOT DETECTED NOT DETECTED Final   Candida tropicalis NOT DETECTED NOT DETECTED Final   Cryptococcus neoformans/gattii NOT DETECTED NOT DETECTED Final   Methicillin resistance mecA/C DETECTED (A) NOT DETECTED Final    Comment: CRITICAL RESULT CALLED TO, READ BACK BY AND VERIFIED WITH: CARISSA DOLAN @ 4403 ON 11/09/2021.Marland KitchenMarland KitchenTKR    Meth resistant mecA/C and MREJ DETECTED (A) NOT DETECTED Final    Comment: CRITICAL RESULT CALLED TO, READ BACK BY AND VERIFIED WITH: CARISSA DOLAN @ 4742 ON 11/09/2021.Marland KitchenMarland KitchenTKR Performed at [facility] [address], [place], [place] [address]   Aerobic Culture w Gram  Stain (superficial specimen)     Status: None   Collection Time: 11/09/21 12:39 PM   Specimen: Thigh; Wound  Result Value Ref Range Status   Specimen Description   Final     THIGH Performed at [facility], [address], [place], [place] [address]    Special Requests   Final    NONE Performed at [facility], [address] [place], [place] [address]    Gram Stain   Final    NO WBC SEEN RARE GRAM POSITIVE COCCI Performed at [facility], [place] [address] [place], [place] [address]    Culture FEW METHICILLIN RESISTANT STAPHYLOCOCCUS AUREUS  Final   Report Status 11/11/2021 FINAL  Final   Organism ID, Bacteria METHICILLIN RESISTANT STAPHYLOCOCCUS AUREUS  Final      Susceptibility   Methicillin resistant staphylococcus aureus - MIC*    CIPROFLOXACIN >=8 RESISTANT Resistant     ERYTHROMYCIN >=8 RESISTANT Resistant     GENTAMICIN <=0.5 SENSITIVE Sensitive     OXACILLIN >=4 RESISTANT Resistant     TETRACYCLINE <=1 SENSITIVE Sensitive     VANCOMYCIN <=0.5 SENSITIVE Sensitive     TRIMETH/SULFA <=10 SENSITIVE Sensitive     CLINDAMYCIN <=0.25 SENSITIVE Sensitive     RIFAMPIN <=0.5 SENSITIVE Sensitive     Inducible Clindamycin NEGATIVE Sensitive     * FEW METHICILLIN RESISTANT STAPHYLOCOCCUS AUREUS  Culture, blood (Routine X 2) w Reflex to ID Panel     Status: None (Preliminary result)   Collection Time: 11/10/21 10:04 AM   Specimen: BLOOD  Result Value Ref Range Status   Specimen Description BLOOD LEFT AC  Final   Special Requests   Final    BOTTLES DRAWN AEROBIC AND ANAEROBIC Blood Culture adequate volume   Culture   Final    NO GROWTH 2 DAYS Performed at [facility], [address] [place], [place] [address]    Report Status PENDING  Incomplete  Culture, blood (Routine X 2) w Reflex to ID Panel     Status: None (Preliminary result)   Collection Time: 11/10/21 10:04 AM   Specimen: BLOOD  Result Value Ref Range Status   Specimen Description BLOOD LEFT HAND  Final   Special Requests   Final    BOTTLES DRAWN AEROBIC AND ANAEROBIC Blood Culture adequate volume   Culture   Final    NO GROWTH 2 DAYS Performed at  [facility], [address] [place], [place] [address]    Report Status PENDING  Incomplete    Antimicrobials: Anti-infectives (From admission, onward)    Start     Dose/Rate Route Frequency Ordered Stop   11/09/21 1200  cefTRIAXone (ROCEPHIN) 1 g in sodium chloride 0.9 % 100 mL IVPB  Status:  Discontinued        1 g 200 mL/hr over 30 Minutes Intravenous Every 24 hours 11/08/21 1457 11/09/21 0155   11/08/21 2200  vancomycin (VANCOREADY) IVPB 1500 mg/300 mL        1,500 mg 150 mL/hr over 120 Minutes Intravenous Every 12 hours 11/08/21 1459     11/08/21 1000  vancomycin (VANCOREADY) IVPB 2000 mg/400 mL        2,000 mg 200 mL/hr over 120 Minutes Intravenous  Once 11/08/21 0947 11/08/21 1256   11/08/21 0815  cefTRIAXone (ROCEPHIN) 1 g in sodium chloride 0.9 % 100 mL IVPB        1 g 200 mL/hr over 30 Minutes Intravenous  Once 11/08/21 0803 11/08/21  1057   11/08/21 0815  vancomycin (VANCOREADY) IVPB 1500 mg/300 mL  Status:  Discontinued        1,500 mg 150 mL/hr over 120 Minutes Intravenous  Once 11/08/21 0803 11/08/21 0947      Culture/Microbiology    Component Value Date/Time   SDES BLOOD LEFT AC 11/10/2021 1004   SDES BLOOD LEFT HAND 11/10/2021 1004   SPECREQUEST  11/10/2021 1004    BOTTLES DRAWN AEROBIC AND ANAEROBIC Blood Culture adequate volume   SPECREQUEST  11/10/2021 1004    BOTTLES DRAWN AEROBIC AND ANAEROBIC Blood Culture adequate volume   CULT  11/10/2021 1004    NO GROWTH 2 DAYS Performed at [facility], [address] [place], [place] [address]    CULT  11/10/2021 1004    NO GROWTH 2 DAYS Performed at [facility], [address] [place], [place] [address]    REPTSTATUS PENDING 11/10/2021 1004   REPTSTATUS PENDING 11/10/2021 1004  Other culture-see note  Radiology Studies: No results found.   LOS: 3 days   Antonieta Pert, MD [facility]  11/12/2021, 10:04 AM

## 2021-11-13 LAB — CBC
HCT: 42.4 % (ref 39.0–52.0)
Hemoglobin: 13.2 g/dL (ref 13.0–17.0)
MCH: 25.4 pg — ABNORMAL LOW (ref 26.0–34.0)
MCHC: 31.1 g/dL (ref 30.0–36.0)
MCV: 81.7 fL (ref 80.0–100.0)
Platelets: 293 10*3/uL (ref 150–400)
RBC: 5.19 MIL/uL (ref 4.22–5.81)
RDW: 14.9 % (ref 11.5–15.5)
WBC: 9.6 10*3/uL (ref 4.0–10.5)
nRBC: 0 % (ref 0.0–0.2)

## 2021-11-13 LAB — BASIC METABOLIC PANEL
Anion gap: 8 (ref 5–15)
BUN: 15 mg/dL (ref 6–20)
CO2: 25 mmol/L (ref 22–32)
Calcium: 8.8 mg/dL — ABNORMAL LOW (ref 8.9–10.3)
Chloride: 106 mmol/L (ref 98–111)
Creatinine, Ser: 0.84 mg/dL (ref 0.61–1.24)
GFR, Estimated: >60 mL/min (ref >60)
Glucose, Bld: 101 mg/dL — ABNORMAL HIGH (ref 70–99)
Potassium: 4.1 mmol/L (ref 3.5–5.1)
Sodium: 139 mmol/L (ref 135–145)

## 2021-11-13 MED ORDER — AMLODIPINE BESYLATE 5 MG PO TABS
5.0000 mg | ORAL_TABLET | Freq: Every day | ORAL | Status: DC
  Administered 2021-11-13: 5 mg via ORAL
  Filled 2021-11-13: qty 1

## 2021-11-13 NOTE — Progress Notes (Addendum)
Progress Note   Patient: Samuel Molina RDE:[id number] DOB: January 10, 1961 DOA: 11/08/2021     4 DOS: the patient was seen and examined on 11/13/2021   Brief hospital course: 65M, hx of HTN, HLD, TIA, GERD, multiple psych issue (depression, anxiety, personal disorder, malingering, adjustment disorder), tobacco abuse, cocaine abuse, Bell's palsy, presented with multiple complaints including left-sided weakness numbness, suicidal ideation and left leg wound. Patient seen in the ED underwent lab work essentially unremarkable chest x-ray no acute finding CT head dense right MCA bifurcation without hemorrhage MRI brain negative for acute stroke CTA head and neck negative for LVO, psychiatry consulted patient was admitted.  Assessment and Plan:  Left thigh abscess:S/p I&D by ED physician wound care consult on board, continue with antibiotics as below.  Wound culture MRSA ( sensitive to clindamycin, tetracycline)-requested general surgery to evaluate given complaint of more pain on the left thigh, no indication for I&D at this time.  -Continue wound care pain control.  -vanc as below    MRSA bacteremia Streptococcal bacteremia: Source from thigh abscess from above. ID on board. Awaiting TTE if negative will need TEE as per ID. Repeat blood culture 7/20  NGTD.  Repeat UDS neg 7/21. -TTE still pending today, if positive may cancel TEE -vanc  Mild chest discomfort/throat pain/heart burn:  symptoms feels like gi related or HTN related change pepcid to protonix, add maalox check ekg done-ST-T changes in inferior leads, ordered troponins. Troponin flat 20 ->20, no longer complaining of chest pain today.  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 Malingering. -ontinue home aspirin and statin    Hypertensive urgency: BP - 203/112 in ED. BP at times high-borderline  controlled  -continue lisinopril to 40 mg -add amlodipine 5 mg  HLD: Continue Lipitor    Adjustment disorder with mixed disturbance of emotions and conduct Personality disorder Anxiety/depression Suicidal ideation-resolved: Seen by psychiatry-stable, continue home Celexa, Neurontin, Seroquel.  No need for IVC or inpatient psychiatric admission per psychiatry   Cocaine abuse :counseling about importance of quitting cocaine Tobacco abuse: cont- Nicotine patch   DVT prophylaxis: enoxaparin (LOVENOX) injection 40 mg Start: 11/08/21 1000 Code Status:   Code Status: Full Code Family Communication: plan of care discussed with patient at bedside. Patient status is: Inpatient for sepsis infection of the left thigh  Level of care: Med-Surg  Dispo: The patient is from: home            Anticipated disposition:  TBD     Subjective: Patient reports is doing well. Reports occasional muscle cramps.   Physical Exam: Vitals:   11/12/21 1546 11/12/21 2009 11/13/21 0454 11/13/21 0813  BP: (!) 168/73 (!) 155/71 (!) 144/83 (!) 155/93  Pulse: 66 68 78 (!) 59  Resp: '20 20 19 18  '$ Temp: 98.1 F (36.7 C) 97.7 F (36.5 C)    TempSrc:  Oral    SpO2: 100% 98% 100% 100%  Weight:       Physical Exam Vitals and nursing note reviewed.  Constitutional:      General: He is not in acute distress.    Appearance: He is not diaphoretic.  HENT:     Head: Atraumatic.  Eyes:     Pupils: Pupils are equal, round, and reactive to light.  Cardiovascular:     Rate and Rhythm: Normal rate and regular rhythm.  Pulses: Normal pulses.     Heart sounds: No murmur heard. Pulmonary:     Effort: Pulmonary effort is normal. No respiratory distress.     Breath sounds: Normal breath sounds. No rales.  Abdominal:     General: Abdomen is flat. There is no distension.     Palpations: Abdomen is soft.     Tenderness: There is no abdominal tenderness. There is no rebound.  Musculoskeletal:     Right lower leg: No  edema.     Left lower leg: No edema.  Skin:    General: Skin is warm and dry.     Capillary Refill: Capillary refill takes less than 2 seconds.     Findings: Lesion present.     Comments: Left upper thigh with bandage  Neurological:     Mental Status: He is alert and oriented to person, place, and time. Mental status is at baseline.  Psychiatric:        Mood and Affect: Mood normal.     Data Reviewed:     Latest Ref Rng & Units 11/13/2021    6:39 AM 11/12/2021    6:15 AM 11/11/2021    8:57 AM  CBC  WBC 4.0 - 10.5 K/uL 9.6  10.2  8.4   Hemoglobin 13.0 - 17.0 g/dL 13.2  13.3  14.7   Hematocrit 39.0 - 52.0 % 42.4  43.2  47.9   Platelets 150 - 400 K/uL 293  299  319       Latest Ref Rng & Units 11/13/2021    6:39 AM 11/12/2021    6:15 AM 11/11/2021    8:57 AM  BMP  Glucose 70 - 99 mg/dL 101  136  71   BUN 6 - 20 mg/dL '15  12  10   '$ Creatinine 0.61 - 1.24 mg/dL 0.84  0.97  0.94   Sodium 135 - 145 mmol/L 139  141  142   Potassium 3.5 - 5.1 mmol/L 4.1  3.7  3.8   Chloride 98 - 111 mmol/L 106  111  108   CO2 22 - 32 mmol/L '25  23  26   '$ Calcium 8.9 - 10.3 mg/dL 8.8  8.8  9.3      Family Communication: not present at bedside  Disposition: Status is: Inpatient Remains inpatient appropriate because: MRSA bacteremia  Planned Discharge Destination: Home    Time spent: 40 minutes  Author: Lorelei Pont, MD 11/13/2021 3:06 PM  For on call review [email or web address].

## 2021-11-14 ENCOUNTER — Inpatient Hospital Stay
Admit: 2021-11-14 | Discharge: 2021-11-14 | Disposition: A | Discharge status: Home / Self Care | Payer: 59 | Attending: Infectious Diseases | Admitting: Infectious Diseases

## 2021-11-14 DIAGNOSIS — R2990 Unspecified symptoms and signs involving the nervous system: Secondary | ICD-10-CM

## 2021-11-14 DIAGNOSIS — B9561 Methicillin susceptible Staphylococcus aureus infection as the cause of diseases classified elsewhere: Secondary | ICD-10-CM | POA: Diagnosis not present

## 2021-11-14 DIAGNOSIS — A4902 Methicillin resistant Staphylococcus aureus infection, unspecified site: Secondary | ICD-10-CM | POA: Diagnosis not present

## 2021-11-14 DIAGNOSIS — L03116 Cellulitis of left lower limb: Secondary | ICD-10-CM | POA: Diagnosis not present

## 2021-11-14 DIAGNOSIS — L02416 Cutaneous abscess of left lower limb: Secondary | ICD-10-CM | POA: Diagnosis not present

## 2021-11-14 LAB — CBC
HCT: 42.4 % (ref 39.0–52.0)
Hemoglobin: 13.4 g/dL (ref 13.0–17.0)
MCH: 26 pg (ref 26.0–34.0)
MCHC: 31.6 g/dL (ref 30.0–36.0)
MCV: 82.2 fL (ref 80.0–100.0)
Platelets: 289 10*3/uL (ref 150–400)
RBC: 5.16 MIL/uL (ref 4.22–5.81)
RDW: 14.9 % (ref 11.5–15.5)
WBC: 8.5 10*3/uL (ref 4.0–10.5)
nRBC: 0 % (ref 0.0–0.2)

## 2021-11-14 LAB — ECHOCARDIOGRAM COMPLETE
AR max vel: 4.3 cm2
AV Area VTI: 4.31 cm2
AV Area mean vel: 3.81 cm2
AV Mean grad: 3 mmHg
AV Peak grad: 5.3 mmHg
Ao pk vel: 1.15 m/s
Area-P 1/2: 2.94 cm2
S' Lateral: 2.8 cm
Weight: 3597.91 oz

## 2021-11-14 LAB — BASIC METABOLIC PANEL
Anion gap: 8 (ref 5–15)
BUN: 12 mg/dL (ref 6–20)
CO2: 28 mmol/L (ref 22–32)
Calcium: 8.9 mg/dL (ref 8.9–10.3)
Chloride: 106 mmol/L (ref 98–111)
Creatinine, Ser: 0.79 mg/dL (ref 0.61–1.24)
GFR, Estimated: >60 mL/min (ref >60)
Glucose, Bld: 85 mg/dL (ref 70–99)
Potassium: 4.3 mmol/L (ref 3.5–5.1)
Sodium: 142 mmol/L (ref 135–145)

## 2021-11-14 MED ORDER — AMLODIPINE BESYLATE 10 MG PO TABS
10.0000 mg | ORAL_TABLET | Freq: Every day | ORAL | Status: DC
  Administered 2021-11-14 – 2021-11-24 (×11): 10 mg via ORAL
  Filled 2021-11-14 (×11): qty 1

## 2021-11-14 NOTE — Clinical Social Work Note (Signed)
  Transition of Care [facility]) Screening Note   Patient Details  Name: Samuel Molina Date of Birth: 1961-02-21   Transition of Care [facility]) CM/SW Contact:    Eileen Stanford, LCSW Phone [place] 11/14/2021, 2:09 PM    Transition of Care Department [facility]) has reviewed patient and no TOC needs have been identified at this time. We will continue to monitor patient advancement through interdisciplinary progression rounds. If new patient transition needs arise, please place a TOC consult.

## 2021-11-14 NOTE — Progress Notes (Signed)
*  PRELIMINARY RESULTS* Echocardiogram 2D Echocardiogram has been performed.  Samuel Molina 11/14/2021, 8:18 AM

## 2021-11-14 NOTE — Assessment & Plan Note (Signed)
Initial blood cultures positive for MRSA most likely secondary to wound infection.  Also found to have Streptococcus species and coagulase-negative staph which was thought to be due to contaminant.  Repeat blood cultures on 11/10/2021 remain negative. Patient never met sepsis criteria. Echocardiogram without any abnormality. TEE tomorrow. ID is on board -Continue with vancomycin for now.

## 2021-11-14 NOTE — Progress Notes (Signed)
Progress Note   Patient: Samuel Molina FYT:[id number] DOB: [birth year] DOA: 11/08/2021     5 DOS: the patient was seen and examined on 11/14/2021   Brief hospital course: 34M, hx of HTN, HLD, TIA, GERD, multiple psych issue (depression, anxiety, personal disorder, malingering, adjustment disorder), tobacco abuse, cocaine abuse, Bell's palsy, presented with multiple complaints including left-sided weakness numbness, suicidal ideation and left leg wound. Patient seen in the ED underwent lab work essentially unremarkable chest x-ray no acute finding CT head dense right MCA bifurcation without hemorrhage MRI brain negative for acute stroke CTA head and neck negative for LVO, psychiatry consulted patient was admitted.  Wound and blood cultures with MRSA.  Blood cultures also grew coagulase-negative staph and streptococcal species which is thought to be due to contaminant per ID. Repeat blood cultures on 11/10/2021 remain negative.  7/24: Patient had echocardiogram today which was found to be normal, TEE is scheduled for tomorrow.   Assessment and Plan: * Cellulitis and abscess of left leg S/p of I&D by ED physician.  Patient does not have fever and chills.  Does not meets criteria for sepsis. Wound cultures with MRSA, sensitive to clindamycin, tetracycline and vancomycin. General surgery was also consulted due to worsening pain but there was no indication for another I&D. -Continue with wound care   MRSA bacteremia Initial blood cultures positive for MRSA most likely secondary to wound infection.  Also found to have Streptococcus species and coagulase-negative staph which was thought to be due to contaminant.  Repeat blood cultures on 11/10/2021 remain negative. Patient never met sepsis criteria. Echocardiogram without any abnormality. TEE tomorrow. ID is on board -Continue with vancomycin for now.  Left-sided weakness Etiology is not clear.  MRI of brain is negative for new stroke.  CTA  negative for LVO.  Differential diagnosis include TIA versus malingering. -Aspirin, Lipitor   Stroke [facility]) Patient has history of stroke - On aspirin and Lipitor  Hypertensive urgency Blood pressure remained mildly elevated. Blood pressure 203/112 on admission.  This is due to medication noncompliance.  Patient is not taking blood pressure medications currently. -Increase the dose of amlodipine to 10 mg -Continue lisinopril -IV hydralazine as needed  HLD (hyperlipidemia) - Lipitor  Adjustment disorder with mixed disturbance of emotions and conduct Seen by psychiatrist-stable - Continue Celexa -Continue Seroquel 100 mg daily -Currently denies homicidal and homicidal ideations -No need for IVC or inpatient psych admission per psychiatry  Personality disorder ([place]) - See above  Depression with anxiety - See above  Suicidal ideation - Denies any suicidal ideation at this time  Cocaine abuse ([place]) - UDS done on 7/21 was negative. -Counseling was provided  Tobacco abuse - Nicotine patch   Subjective: Patient was seen and examined today.  Denies any new complaints.  He thinks he is improving and would like to go home.  Physical Exam: Vitals:   11/13/21 2154 11/14/21 0516 11/14/21 0817 11/14/21 1543  BP: (!) 162/91 122/68 (!) 161/96 (!) 143/83  Pulse: 73 69 61 72  Resp: 20 16 (!) 21   Temp: 98.4 F (36.9 C) 97.8 F (36.6 C) 98.6 F (37 C) 98.1 F (36.7 C)  TempSrc: Oral     SpO2: 96% 97% 100% 100%  Weight:       General.     In no acute distress. Pulmonary.  Lungs clear bilaterally, normal respiratory effort. CV.  Regular rate and rhythm, no JVD, rub or murmur. Abdomen.  Soft, nontender, nondistended, BS positive. CNS.  Alert and  oriented .  No focal neurologic deficit. Extremities.  No edema, no cyanosis, pulses intact and symmetrical.  Left upper thigh with clean bandages. Psychiatry.  Judgment and insight appears normal.  Data Reviewed: Prior data and  images reviewed  Family Communication: Discussed with patient  Disposition: Status is: Inpatient Remains inpatient appropriate because: Severity of illness   Planned Discharge Destination: Home  DVT prophylaxis.  Lovenox Time spent: 50 minutes  This record has been created using Systems analyst. Errors have been sought and corrected,but may not always be located. Such creation errors do not reflect on the standard of care.  Author: Lorella Nimrod, MD 11/14/2021 3:58 PM  For on call review [email or web address].

## 2021-11-14 NOTE — Progress Notes (Signed)
Id Pt c/o weakness left side of face Left eye blurred vision  O/e awake and alert Patient Vitals for the past 24 hrs:  BP Temp Temp src Pulse Resp SpO2  11/14/21 1543 (!) 143/83 98.1 F (36.7 C) -- 72 -- 100 %  11/14/21 0817 (!) 161/96 98.6 F (37 C) -- 61 (!) 21 100 %  11/14/21 0516 122/68 97.8 F (36.6 C) -- 69 16 97 %  11/13/21 2154 (!) 162/91 98.4 F (36.9 C) Oral 73 20 96 %  11/13/21 1614 (!) 134/54 97.7 F (36.5 C) Oral 64 17 100 %   Chest b/l air entry [id number] And soft Left thigh posteriorly- covered with dressing CBS- left facial palsy  Labs    Latest Ref Rng & Units 11/14/2021    6:21 AM 11/13/2021    6:39 AM 11/12/2021    6:15 AM  CBC  WBC 4.0 - 10.5 K/uL 8.5  9.6  10.2   Hemoglobin 13.0 - 17.0 g/dL 13.4  13.2  13.3   Hematocrit 39.0 - 52.0 % 42.4  42.4  43.2   Platelets 150 - 400 K/uL 289  293  299        Latest Ref Rng & Units 11/14/2021    6:21 AM 11/13/2021    6:39 AM 11/12/2021    6:15 AM  CMP  Glucose 70 - 99 mg/dL 85  101  136   BUN 6 - 20 mg/dL '12  15  12   '$ Creatinine 0.61 - 1.24 mg/dL 0.79  0.84  0.97   Sodium 135 - 145 mmol/L 142  139  141   Potassium 3.5 - 5.1 mmol/L 4.3  4.1  3.7   Chloride 98 - 111 mmol/L 106  106  111   CO2 22 - 32 mmol/L '28  25  23   '$ Calcium 8.9 - 10.3 mg/dL 8.9  8.8  8.8      Micro 11/08/21 BC- MSSA Strep Wound culture MRSA 11/10/21 BC - Ng  Impression/recommendation Staph aureus bacteremia and MRSA wound infection Asked lab to repeat the susceptibility in Blood Still pending Continue vanco 2 d echo done Will need TEE if echo N  Left facial palsy-  Has baseline weakness and says it is worse today  Substance use disorder

## 2021-11-15 ENCOUNTER — Inpatient Hospital Stay (HOSPITAL_COMMUNITY)
Admit: 2021-11-15 | Discharge: 2021-11-15 | Disposition: A | Discharge status: Home / Self Care | Payer: 59 | Attending: Nurse Practitioner | Admitting: Nurse Practitioner

## 2021-11-15 ENCOUNTER — Encounter
Admission: EM | Disposition: A | Discharge status: Home Health | Payer: Self-pay | Source: Home / Self Care | Attending: Internal Medicine

## 2021-11-15 DIAGNOSIS — I340 Nonrheumatic mitral (valve) insufficiency: Secondary | ICD-10-CM | POA: Diagnosis not present

## 2021-11-15 DIAGNOSIS — I361 Nonrheumatic tricuspid (valve) insufficiency: Secondary | ICD-10-CM | POA: Diagnosis not present

## 2021-11-15 DIAGNOSIS — R7881 Bacteremia: Secondary | ICD-10-CM | POA: Diagnosis not present

## 2021-11-15 HISTORY — PX: TEE WITHOUT CARDIOVERSION: SHX5443

## 2021-11-15 LAB — CULTURE, BLOOD (ROUTINE X 2)
Culture: NO GROWTH
Culture: NO GROWTH
Special Requests: ADEQUATE
Special Requests: ADEQUATE

## 2021-11-15 LAB — CREATININE, SERUM
Creatinine, Ser: 0.85 mg/dL (ref 0.61–1.24)
GFR, Estimated: >60 mL/min (ref >60)

## 2021-11-15 SURGERY — ECHOCARDIOGRAM, TRANSESOPHAGEAL
Anesthesia: Moderate Sedation

## 2021-11-15 MED ORDER — FENTANYL CITRATE (PF) 100 MCG/2ML IJ SOLN
INTRAMUSCULAR | Status: AC
  Filled 2021-11-15: qty 2

## 2021-11-15 MED ORDER — MIDAZOLAM HCL 2 MG/2ML IJ SOLN
INTRAMUSCULAR | Status: AC
  Filled 2021-11-15: qty 2

## 2021-11-15 MED ORDER — MIDAZOLAM HCL 5 MG/5ML IJ SOLN
INTRAMUSCULAR | Status: AC | PRN
  Administered 2021-11-15: 2 mg via INTRAVENOUS

## 2021-11-15 MED ORDER — SODIUM CHLORIDE 0.9 % IV SOLN
INTRAVENOUS | Status: DC
  Administered 2021-11-15: 1000 mL via INTRAVENOUS

## 2021-11-15 MED ORDER — MIDAZOLAM HCL 2 MG/2ML IJ SOLN
INTRAMUSCULAR | Status: AC | PRN
  Administered 2021-11-15: 1 mg via INTRAVENOUS

## 2021-11-15 MED ORDER — LIDOCAINE VISCOUS HCL 2 % MT SOLN
OROMUCOSAL | Status: AC
  Administered 2021-11-15: 15 mL
  Filled 2021-11-15: qty 15

## 2021-11-15 MED ORDER — FENTANYL CITRATE (PF) 100 MCG/2ML IJ SOLN
INTRAMUSCULAR | Status: AC | PRN
  Administered 2021-11-15 (×2): 50 ug via INTRAVENOUS

## 2021-11-15 MED ORDER — BUTAMBEN-TETRACAINE-BENZOCAINE 2-2-14 % EX AERO
INHALATION_SPRAY | CUTANEOUS | Status: AC
  Filled 2021-11-15: qty 5

## 2021-11-15 NOTE — Progress Notes (Signed)
Progress Note   Patient: Samuel Molina AXK:[id number] DOB: 11/24/1960 DOA: 11/08/2021     6 DOS: the patient was seen and examined on 11/15/2021   Brief hospital course: 66M, hx of HTN, HLD, TIA, GERD, multiple psych issue (depression, anxiety, personal disorder, malingering, adjustment disorder), tobacco abuse, cocaine abuse, Bell's palsy, presented with multiple complaints including left-sided weakness numbness, suicidal ideation and left leg wound. Patient seen in the ED underwent lab work essentially unremarkable chest x-ray no acute finding CT head dense right MCA bifurcation without hemorrhage MRI brain negative for acute stroke CTA head and neck negative for LVO, psychiatry consulted patient was admitted.  Wound and blood cultures with MRSA.  Blood cultures also grew coagulase-negative staph and streptococcal species which is thought to be due to contaminant per ID. Repeat blood cultures on 11/10/2021 remain negative.  7/24: Patient had echocardiogram today which was found to be normal, TEE is scheduled for tomorrow.  7/25: Patient had normal TEE today, no sign of endocarditis.  He wants to stay in hospital to complete IV antibiotics.  Will appreciate ID recommendations   Assessment and Plan: * Cellulitis and abscess of left leg S/p of I&D by ED physician.  Patient does not have fever and chills.  Does not meets criteria for sepsis. Wound cultures with MRSA, sensitive to clindamycin, tetracycline and vancomycin. General surgery was also consulted due to worsening pain but there was no indication for another I&D. -Continue with wound care   MRSA bacteremia Initial blood cultures positive for MRSA most likely secondary to wound infection.  Also found to have Streptococcus species and coagulase-negative staph which was thought to be due to contaminant.  Repeat blood cultures on 11/10/2021 remain negative. Patient never met sepsis criteria. Echocardiogram without any abnormality. TEE  was negative for endocarditis ID is on board -Continue with vancomycin for now.  Left-sided weakness Etiology is not clear.  MRI of brain is negative for new stroke.  CTA negative for LVO.  Differential diagnosis include TIA versus malingering. -Aspirin, Lipitor   Stroke [facility]) Patient has history of stroke - On aspirin and Lipitor  Hypertensive urgency Blood pressure remained mildly elevated. Blood pressure 203/112 on admission.  This is due to medication noncompliance.  Patient is not taking blood pressure medications currently. -Increase the dose of amlodipine to 10 mg -Continue lisinopril -IV hydralazine as needed  HLD (hyperlipidemia) - Lipitor  Adjustment disorder with mixed disturbance of emotions and conduct Seen by psychiatrist-stable - Continue Celexa -Continue Seroquel 100 mg daily -Currently denies homicidal and homicidal ideations -No need for IVC or inpatient psych admission per psychiatry  Personality disorder ([place]) - See above  Depression with anxiety - See above  Suicidal ideation - Denies any suicidal ideation at this time  Cocaine abuse ([place]) - UDS done on 7/21 was negative. -Counseling was provided  Tobacco abuse - Nicotine patch     Subjective: Patient was seen and examined today.  He was waiting for his TEE.  No new complaints.  Patient was very hesitant to do IV antibiotics at home and would like to stay in the hospital to complete the course.  Physical Exam: Vitals:   11/15/21 1345 11/15/21 1350 11/15/21 1400 11/15/21 1423  BP: 120/67 (!) 149/81 134/90 (!) 162/87  Pulse: 67 66 66 63  Resp: '11 12 14 17  ' Temp:    (!) 97.4 F (36.3 C)  TempSrc:      SpO2:    97%  Weight:      Height:  General.     In no acute distress. Pulmonary.  Lungs clear bilaterally, normal respiratory effort. CV.  Regular rate and rhythm, no JVD, rub or murmur. Abdomen.  Soft, nontender, nondistended, BS positive. CNS.  Alert and oriented .  No focal  neurologic deficit. Extremities.  No edema, no cyanosis, pulses intact and symmetrical. Psychiatry.  Judgment and insight appears normal.  Data Reviewed: Prior data reviewed  Family Communication:   Disposition: Status is: Inpatient Remains inpatient appropriate because: Severity of illness   Planned Discharge Destination: Home  DVT prophylaxis.  Lovenox Time spent: 40 minutes  This record has been created using Systems analyst. Errors have been sought and corrected,but may not always be located. Such creation errors do not reflect on the standard of care.  Author: Lorella Nimrod, MD 11/15/2021 4:47 PM  For on call review [email or web address].

## 2021-11-15 NOTE — Progress Notes (Signed)
*  PRELIMINARY RESULTS* Echocardiogram Echocardiogram Transesophageal has been performed.  Samuel Molina 11/15/2021, 1:50 PM

## 2021-11-15 NOTE — Assessment & Plan Note (Signed)
Initial blood cultures positive for MRSA most likely secondary to wound infection.  Also found to have Streptococcus species and coagulase-negative staph which was thought to be due to contaminant.  Repeat blood cultures on 11/10/2021 remain negative. Patient never met sepsis criteria. Echocardiogram without any abnormality. TEE was negative for endocarditis ID is on board -Continue with vancomycin for now.

## 2021-11-15 NOTE — Assessment & Plan Note (Signed)
S/p of I&D by ED physician.  Patient does not have fever and chills.  Does not meets criteria for sepsis. Wound cultures with MRSA, sensitive to clindamycin, tetracycline and vancomycin. General surgery was also consulted due to worsening pain but there was no indication for another I&D. -Continue with wound care

## 2021-11-15 NOTE — TOC Progression Note (Signed)
Transition of Care [facility]) - Progression Note    Patient Details  Name: Samuel Molina MRN: [id number] Date of Birth: 10-20-1960  [facility]) CM/SW Contact  Eileen Stanford, LCSW Phone Number: 11/15/2021, 2:29 PM  Clinical Narrative:   Pending TEE. Pt will have to remain in patient if needs 2 weeks of IV antibiotics as he has hx of SA.         Expected Discharge Plan and Services                                                 Social Determinants of Health (SDOH) Interventions    Readmission Risk Interventions    06/08/2020    1:33 PM  Readmission Risk Prevention Plan  Transportation Screening Complete  Medication Review (RN Care Manager) Complete  PCP or Specialist appointment within 3-5 days of discharge Not Complete  PCP/Specialist Appt Not Complete comments patient moving to [facility] or [place] Not Complete  HRI or Home Care Consult Pt Refusal Comments NA  SW Recovery Care/Counseling Consult Complete  Palliative Care Screening Not [place] Not Applicable

## 2021-11-16 ENCOUNTER — Inpatient Hospital Stay: Payer: 59

## 2021-11-16 ENCOUNTER — Encounter: Payer: Self-pay | Admitting: Cardiovascular Disease

## 2021-11-16 DIAGNOSIS — L089 Local infection of the skin and subcutaneous tissue, unspecified: Secondary | ICD-10-CM

## 2021-11-16 DIAGNOSIS — R7881 Bacteremia: Secondary | ICD-10-CM | POA: Diagnosis not present

## 2021-11-16 DIAGNOSIS — B9562 Methicillin resistant Staphylococcus aureus infection as the cause of diseases classified elsewhere: Secondary | ICD-10-CM | POA: Diagnosis not present

## 2021-11-16 LAB — VANCOMYCIN, TROUGH: Vancomycin Tr: 12 ug/mL — ABNORMAL LOW (ref 15–20)

## 2021-11-16 LAB — CREATININE, SERUM
Creatinine, Ser: 0.86 mg/dL (ref 0.61–1.24)
GFR, Estimated: >60 mL/min (ref >60)

## 2021-11-16 NOTE — Progress Notes (Signed)
Patient pressed call bell to notify staff that he had fallen. Found pt seated in front of bed on buttocks, patient c/o right knee pain and swelling. Pt states "I was walking back from throwing something away in the trash can and my knee just gave out. Now it hurts and it ain't ever swollen like this." Denies hitting his head, dizziness or LOC. Notified Neomia Glass, NP of above. Patient upgraded from moderate to high falls risk. Educated patient on safety interventions for high falls risk patient, including activating the bed alarm. Pt verbalized understanding. Bed low and in locked position. Bed alarm on. Pt refused to put non-slip socks on while in bed.     11/16/21 0414  What Happened  Was fall witnessed? No  Was patient injured? Yes  Patient found on floor  Found by Staff-comment Silva Bandy RN)  Stated prior activity ambulating-unassisted  Follow Up  MD notified Neomia Glass NP  Time MD notified [phone]  Family notified No - patient refusal  Additional tests Yes-comment (xray of right knee)  Simple treatment Other (comment) (elevated)  Progress note created (see row info) Yes  Adult Fall Risk Assessment  Risk Factor Category (scoring not indicated) Fall has occurred during this admission (document High fall risk)  Patient Fall Risk Level High fall risk  Adult Fall Risk Interventions  Required Bundle Interventions *See Row Information* High fall risk - low, moderate, and high requirements implemented  Screening for Fall Injury Risk (To be completed on HIGH fall risk patients) - Assessing Need for Floor Mats  Risk For Fall Injury- Criteria for Floor Mats Previous fall this admission  Will Implement Floor Mats Yes  Vitals  Temp 97.8 F (36.6 C)  BP (!) 144/76  MAP (mmHg) 96  BP Location Left Arm  BP Method Automatic  Patient Position (if appropriate) Sitting  Pulse Rate 70  Pulse Rate Source Monitor  Resp 18  Oxygen Therapy  SpO2 100 %  O2 Device Room Air  Pain Assessment  Pain  Scale 0-10  Pain Score 8  Pain Type Acute pain  Pain Location Knee  Pain Orientation Right  Pain Intervention(s) Cold applied;Elevated extremity  Neurological  Neuro (WDL) WDL  Level of Consciousness Alert  Orientation Level Oriented X4  Cognition Appropriate at baseline  Speech Clear  R Pupil Size (mm) 3  R Pupil Shape Round  R Pupil Reaction Brisk  L Pupil Size (mm) 3  L Pupil Shape Round  L Pupil Reaction Brisk  Facial Symmetry Symmetrical  R Hand Grip Strong  L Hand Grip Strong  Right Pronator Drift Absent  Left Pronator Drift Absent  R Foot Dorsiflexion Moderate  L Foot Dorsiflexion Moderate  R Foot Plantar Flexion Moderate  L Foot Plantar Flexion Moderate  RUE Motor Response Purposeful movement  RUE Sensation Full sensation  RUE Motor Strength 5  LUE Motor Response Purposeful movement  LUE Sensation Full sensation  LUE Motor Strength 5  RLE Motor Response Purposeful movement  RLE Sensation Full sensation;Pain  RLE Motor Strength 5  LLE Motor Response Purposeful movement  LLE Sensation Full sensation;Pain  LLE Motor Strength 4  Neuro Symptoms None  Glasgow Coma Scale  Eye Opening 4  Best Verbal Response (NON-intubated) 5  Best Motor Response 6  Glasgow Coma Scale Score 15  Musculoskeletal  Musculoskeletal (WDL) X  Assistive Device None  Generalized Weakness Yes  Weight Bearing Restrictions No  Musculoskeletal Details  LLE Weakness  Right Knee Limited movement;Swelling  Integumentary  Integumentary (WDL)  X  Skin Integrity  (see LDA)

## 2021-11-16 NOTE — Progress Notes (Signed)
Progress Note   Patient: Samuel Molina ZLD:[id number] DOB: 10/05/1960 DOA: 11/08/2021     7 DOS: the patient was seen and examined on 11/16/2021   Brief hospital course: 25M, hx of HTN, HLD, TIA, GERD, multiple psych issue (depression, anxiety, personal disorder, malingering, adjustment disorder), tobacco abuse, cocaine abuse, Bell's palsy, presented with multiple complaints including left-sided weakness numbness, suicidal ideation and left leg wound. Patient seen in the ED underwent lab work essentially unremarkable chest x-ray no acute finding CT head dense right MCA bifurcation without hemorrhage MRI brain negative for acute stroke CTA head and neck negative for LVO, psychiatry consulted patient was admitted.  Wound and blood cultures with MRSA.  Blood cultures also grew coagulase-negative staph and streptococcal species which is thought to be due to contaminant per ID. Repeat blood cultures on 11/10/2021 remain negative.  7/24: Patient had echocardiogram today which was found to be normal, TEE is scheduled for tomorrow.  7/25: Patient had normal TEE today, no sign of endocarditis.  He wants to stay in hospital to complete IV antibiotics.  Will appreciate ID recommendations.  7/26: ID is recommending 2 weeks of IV antibiotics and for which patient will remain in the hospital, Followed by 2 weeks of p.o. antibiotics.  Remained stable. Had a small fall overnight while walking independently, apparently hurt left knee but imaging was negative for any acute abnormality.  No other injuries   Assessment and Plan: * Cellulitis and abscess of left leg S/p of I&D by ED physician.  Patient does not have fever and chills.  Does not meets criteria for sepsis. Wound cultures with MRSA, sensitive to clindamycin, tetracycline and vancomycin. General surgery was also consulted due to worsening pain but there was no indication for another I&D. -Continue with wound care   MRSA bacteremia Initial  blood cultures positive for MRSA most likely secondary to wound infection.  Also found to have Streptococcus species and coagulase-negative staph which was thought to be due to contaminant.  Repeat blood cultures on 11/10/2021 remain negative. Patient never met sepsis criteria. TTE and TEE was negative for endocarditis -2 weeks of IV vancomycin followed by 2 weeks of p.o. antibiotics per ID  Left-sided weakness Etiology is not clear.  MRI of brain is negative for new stroke.  CTA negative for LVO.  Differential diagnosis include TIA versus malingering. -Aspirin, Lipitor   Stroke [facility]) Patient has history of stroke - On aspirin and Lipitor  Hypertensive urgency Blood pressure remained mildly elevated. Blood pressure 203/112 on admission.  This is due to medication noncompliance.  Patient is not taking blood pressure medications currently. -Increase the dose of amlodipine to 10 mg -Continue lisinopril -IV hydralazine as needed  HLD (hyperlipidemia) - Lipitor  Adjustment disorder with mixed disturbance of emotions and conduct Seen by psychiatrist-stable - Continue Celexa -Continue Seroquel 100 mg daily -Currently denies homicidal and homicidal ideations -No need for IVC or inpatient psych admission per psychiatry  Personality disorder ([place]) - See above  Depression with anxiety - See above  Suicidal ideation - Denies any suicidal ideation at this time  Cocaine abuse ([place]) - UDS done on 7/21 was negative. -Counseling was provided  Tobacco abuse - Nicotine patch  Subjective: Patient was seen and examined today.  Had a fall overnight while walking independently, no significant injuries.  Left knee with mild discomfort.  Physical Exam: Vitals:   11/15/21 2039 11/16/21 0414 11/16/21 0834 11/16/21 1631  BP: 120/74 (!) 144/76 (!) 155/71 (!) 136/111  Pulse: 74 70 70  74  Resp: '16 18 18 18  ' Temp: 97.6 F (36.4 C) 97.8 F (36.6 C) (!) 97.5 F (36.4 C) 98.7 F (37.1 C)   TempSrc: Oral     SpO2: 99% 100% 100% 95%  Weight:      Height:       General.  Well-developed gentleman, in no acute distress. Pulmonary.  Lungs clear bilaterally, normal respiratory effort. CV.  Regular rate and rhythm, no JVD, rub or murmur. Abdomen.  Soft, nontender, nondistended, BS positive. CNS.  Alert and oriented .  No focal neurologic deficit. Extremities.  No edema, no cyanosis, pulses intact and symmetrical. Psychiatry.  Judgment and insight appears normal.  Data Reviewed: Prior data reviewed  Family Communication: Discussed with patient  Disposition: Status is: Inpatient Remains inpatient appropriate because: Severity of illness, requiring prolonged IV antibiotics.   Planned Discharge Destination: Home  PT prophylaxis.  Lovenox Time spent: 40 minutes  This record has been created using Systems analyst. Errors have been sought and corrected,but may not always be located. Such creation errors do not reflect on the standard of care.  Author: Lorella Nimrod, MD 11/16/2021 5:04 PM  For on call review [email or web address].

## 2021-11-16 NOTE — Progress Notes (Signed)
Pharmacy Antibiotic Note  Samuel Molina is a 61 y.o. male w/ PMH of HTN, stroke and functional left leg weakness admitted on 11/08/2021 with bacteremia related to cellulitis.  Pharmacy has been consulted for vancomycin dosing. EDP drained leg abscess but no culture sent.  Today, 11/16/2021 Renal: SCr stable WBC WNL Afebrile 7/18 Bcx: 1 set with MSSA, MRSA and strep species.  2nd set with MSSA 7/19 Thigh: MRSA 7/20 Bcx NGTD 7/25 TEE: no evidence of endocarditis  Vancomycin level 7/26  on '1500mg'$  IV q12h Vancomycin trough = 12 mcg/mL '@09'$ :45  Vancomycin level 7/21 (Note - patient refused vancomycin peak at 0200 7/21 Vancomycin '1500mg'$  IV q12h @ 22:47 Vancomycin trough = 12 mcg/ml @ 0857  Plan:   Based on trough today, vancomycin 1500 mg IV every 12 hours  Unable to calculate AUC as patient refused peak but he looks to be matching the calculated numbers below.   Goal AUC 400-600. Expected AUC: 443.2 SCr used: 0.85 mg/dL Ke: 0.092 h-1, T1/2 7.5 h  Monitor renal functon and recheck trough every 5-7 days F/u antibiotic plan with MSSA in blood cx and MRSA in wound cx   Height: '6\' 1"'$  (185.4 cm) Weight: 104.3 kg (230 lb) IBW/kg (Calculated) : 79.9  Temp (24hrs), Avg:97.6 F (36.4 C), Min:97.4 F (36.3 C), Max:97.8 F (36.6 C)  Recent Labs  Lab 11/10/21 1004 11/11/21 0857 11/12/21 0615 11/13/21 0639 11/14/21 0621 11/15/21 0614 11/16/21 0945  WBC 8.2 8.4 10.2 9.6 8.5  --   --   CREATININE 1.00 0.94 0.97 0.84 0.79 0.85 0.86  VANCOTROUGH  --  12*  --   --   --   --  12*     Estimated Creatinine Clearance: 117.3 mL/min (by C-G formula based on SCr of 0.86 mg/dL).    Allergies  Allergen Reactions   Acetaminophen Itching, Nausea And Vomiting and Rash    Antimicrobials this admission: 07/18 vancomycin >> 07/18 ceftriaxone x1   Microbiology results: 7/18 Bcx: 1 set with MSSA, MRSA and strep species. 2nd set with MSSA 7/19 Thigh: MRSA 7/20 Bcx NGTD  Thank you for  allowing pharmacy to be a part of this patient's care.  Doreene Eland, PharmD, BCPS, BCIDP Work Cell: [phone] 11/16/2021 2:22 PM

## 2021-11-16 NOTE — Assessment & Plan Note (Signed)
Initial blood cultures positive for MRSA most likely secondary to wound infection.  Also found to have Streptococcus species and coagulase-negative staph which was thought to be due to contaminant.  Repeat blood cultures on 11/10/2021 remain negative. Patient never met sepsis criteria. TTE and TEE was negative for endocarditis -2 weeks of IV vancomycin followed by 2 weeks of p.o. antibiotics per ID

## 2021-11-16 NOTE — Progress Notes (Signed)
Date of Admission:  11/08/2021      ID: Samuel Molina is a 61 y.o. male with   Principal Problem:   Cellulitis and abscess of left leg Active Problems:   Cocaine abuse (HCC)   Personality disorder (HCC)   Stroke (HCC)   HLD (hyperlipidemia)   Tobacco abuse   Left-sided weakness   Adjustment disorder with mixed disturbance of emotions and conduct   Depression with anxiety   Hypertensive urgency   Suicidal ideation   MRSA bacteremia    Subjective: Pain left thigh improved He fell last night on his rt knee and says the knee was swollen- has been using cold compress  Medications:   amLODipine  10 mg Oral Daily   aspirin EC  81 mg Oral Daily   atorvastatin  80 mg Oral QPM   citalopram  10 mg Oral Daily   enoxaparin (LOVENOX) injection  40 mg Subcutaneous Q24H   gabapentin  300 mg Oral TID   haloperidol lactate  2 mg Intravenous Once   lisinopril  40 mg Oral Daily   nicotine  21 mg Transdermal Daily   pantoprazole  40 mg Oral Daily   QUEtiapine  100 mg Oral QHS    Objective: Vital signs in last 24 hours: Temp:  [97.4 F (36.3 C)-97.8 F (36.6 C)] 97.5 F (36.4 C) (07/26 0834) Pulse Rate:  [59-74] 70 (07/26 0834) Resp:  [11-18] 18 (07/26 0834) BP: (101-178)/(57-100) 155/71 (07/26 0834) SpO2:  [97 %-100 %] 100 % (07/26 0834) Weight:  [104.3 kg] 104.3 kg (07/25 1251)   PHYSICAL EXAM:  General: Alert, cooperative,    Neck: Supple, symmetrical, no adenopathy, thyroid: non tender no carotid bruit and no JVD. Lungs: Clear to auscultation bilaterally. No Wheezing or Rhonchi. No rales. Heart: Regular rate and rhythm, no murmur, rub or gallop. Abdomen: Soft, non-tender,not distended. Bowel sounds normal. No masses Extremities: left thigh   Skin: No rashes or lesions. Or bruising Lymph: Cervical, supraclavicular normal. Neurologic: Grossly non-focal  Lab Results Recent Labs    11/14/21 0621 11/15/21 0614 11/16/21 0945  WBC 8.5  --   --   HGB 13.4  --    --   HCT 42.4  --   --   NA 142  --   --   K 4.3  --   --   CL 106  --   --   CO2 28  --   --   BUN 12  --   --   CREATININE 0.79 0.85 0.86   Microbiology: BC- MRSA WC- MRSA Studies/Results: DG Knee 1-2 Views Right  Result Date: 11/16/2021 CLINICAL DATA:  Fall with right knee pain. EXAM: RIGHT KNEE - 1-2 VIEW COMPARISON:  Study of 10/20/2019. FINDINGS: There are moderately prominent tricompartmental marginal osteophytes, relatively mild joint space loss except in the patellofemoral joint where the joint narrowing is moderate and increased. There is normal bone mineralization with no evidence of fractures. There is a small suprapatellar bursal effusion and small loose body in the fluid. The superficial soft tissues are unremarkable. IMPRESSION: 1. Degenerative changes without evidence of fractures. 2. Small suprapatellar bursal fluid with small loose body in the fluid. Electronically Signed   By: Telford Nab M.D.   On: 11/16/2021 06:21   ECHO TEE  Result Date: 11/15/2021    TRANSESOPHOGEAL ECHO REPORT   Patient Name:   Samuel Molina Date of Exam: 11/15/2021 Medical Rec #:  [id number]  Height:       73.0 in Accession #:    [id number]        Weight:       230.0 lb Date of Birth:  1960-11-14         BSA:          2.283 m Patient Age:    13 years          BP:           124/80 mmHg Patient Gender: M                 HR:           72 bpm. Exam Location:  [facility] Procedure: Transesophageal Echo Indications:    bacteremia, r/o endocarditis  History:        Patient has prior history of Echocardiogram examinations.                 Bacteremia.  Sonographer:    Sherrie Sport Referring Phys: [id number] CHRISTOPHER RONALD BERGE PROCEDURE: TEE procedure time was 25 minutes. The transesophogeal probe was passed without difficulty through the esophogus of the patient. Imaged were obtained with the patient in a left lateral decubitus position. Local oropharyngeal anesthetic was provided with Cetacaine and viscous  lidocaine. Sedation performed by performing physician. Patients was under conscious sedation during this procedure. Anesthetic administered: 111mg of Fentanyl, 4.'0mg'$  of Versed. The patient's vital signs; including heart rate, blood pressure, and oxygen saturation; remained stable throughout the procedure. The patient developed no complications during the procedure. IMPRESSIONS  1. No valve vegetation noted concerning for endocarditis  2. Left ventricular ejection fraction, by estimation, is 60 to 65%. The left ventricle has normal function. The left ventricle has no regional wall motion abnormalities.  3. Right ventricular systolic function is normal. The right ventricular size is normal.  4. No left atrial/left atrial appendage thrombus was detected.  5. The mitral valve is normal in structure. Mild mitral valve regurgitation. No evidence of mitral stenosis.  6. The aortic valve is normal in structure. Aortic valve regurgitation is not visualized. No aortic stenosis is present.  7. The inferior vena cava is normal in size with greater than 50% respiratory variability, suggesting right atrial pressure of 3 mmHg.  8. Agitated saline contrast bubble study was negative, with no evidence of any interatrial shunt. Conclusion(s)/Recommendation(s): Normal biventricular function without evidence of hemodynamically significant valvular heart disease. FINDINGS  Left Ventricle: Left ventricular ejection fraction, by estimation, is 60 to 65%. The left ventricle has normal function. The left ventricle has no regional wall motion abnormalities. The left ventricular internal cavity size was normal in size. There is  no left ventricular hypertrophy. Right Ventricle: The right ventricular size is normal. No increase in right ventricular wall thickness. Right ventricular systolic function is normal. Left Atrium: Left atrial size was normal in size. No left atrial/left atrial appendage thrombus was detected. Right Atrium: Right  atrial size was normal in size. Pericardium: There is no evidence of pericardial effusion. Mitral Valve: The mitral valve is normal in structure. Mild mitral valve regurgitation. No evidence of mitral valve stenosis. Tricuspid Valve: The tricuspid valve is normal in structure. Tricuspid valve regurgitation is mild . No evidence of tricuspid stenosis. Aortic Valve: The aortic valve is normal in structure. Aortic valve regurgitation is not visualized. No aortic stenosis is present. Pulmonic Valve: The pulmonic valve was normal in structure. Pulmonic valve regurgitation is not visualized. No evidence of pulmonic stenosis. Aorta: The aortic  root is normal in size and structure. There is minimal (Grade I) atheroma plaque involving the descending aorta. Venous: The inferior vena cava is normal in size with greater than 50% respiratory variability, suggesting right atrial pressure of 3 mmHg. IAS/Shunts: No atrial level shunt detected by color flow Doppler. Agitated saline contrast was given intravenously to evaluate for intracardiac shunting. Agitated saline contrast bubble study was negative, with no evidence of any interatrial shunt. Ida Rogue MD Electronically signed by Ida Rogue MD Signature Date/Time: 11/15/2021/1:44:16 PM    Final      Assessment/Plan: MRSA bacteremia ( Also MSSA and Streptococcus in blood culture) TEE neg Repeat culture neg  Pt will need 2 weeks of IV vanco until 11/23/21 and then PO linezolid  MRSA soft tissue infection left thigh S/p I/D in the ED Improving  Fall in the room and hurt his knee- no fracture  Substance use disorder  Discussed the management with patient and care team

## 2021-11-16 NOTE — Assessment & Plan Note (Signed)
S/p of I&D by ED physician.  Patient does not have fever and chills.  Does not meets criteria for sepsis. Wound cultures with MRSA, sensitive to clindamycin, tetracycline and vancomycin. General surgery was also consulted due to worsening pain but there was no indication for another I&D. -Continue with wound care

## 2021-11-16 NOTE — Progress Notes (Signed)
       CROSS COVER NOTE  NAME: Samuel Molina MRN: [id number] DOB : Mar 26, 1961   Notified by nursing that patient has fallen while walking independently in room. Mr Hammar reports his knee gave out and he fell with his buttocks being the point of impact. He reports (R) knee pain and swelling. No loss of consciousness or change in sensorium that precipitated the fall. The fall was unwitnessed and nursing reports patient did not hit his head. Xray of (R) Knee ordered. We will continue to monitor and consider additional testing if patient begins to exhibit any additional symptoms of pain or change from neurological baseline.  This document was prepared using Dragon voice recognition software and may include unintentional dictation errors.  Neomia Glass DNP, MHA, FNP-BC Nurse Practitioner [facility] Pager [phone]

## 2021-11-16 NOTE — Progress Notes (Signed)
Patient had unwitnessed fall last night. Patient had bed alarm on and was upset that it was on. Patient is A/O x4 and refuses the bed alarm. I educated patient on the importance of bed alarm and he still refused.

## 2021-11-17 NOTE — Assessment & Plan Note (Signed)
Initial blood cultures positive for MRSA most likely secondary to wound infection.  Also found to have Streptococcus species and coagulase-negative staph which was thought to be due to contaminant.  Repeat blood cultures on 11/10/2021 remain negative. Patient never met sepsis criteria. TTE and TEE was negative for endocarditis -2 weeks of IV vancomycin followed by 2 weeks of p.o. antibiotics per ID, vancomycin will be continued until 11/23/2021 followed by 2 weeks of linezolid

## 2021-11-17 NOTE — Progress Notes (Signed)
   11/17/21 2110  Mobility  HOB Elevated/Bed Position Self regulated  Activity Ambulated independently in room;Ambulated independently to bathroom  Range of Motion/Exercises Active;All extremities  Level of Assistance Independent  Assistive Device None  Distance Ambulated (ft) 25 ft  Activity Response Tolerated well

## 2021-11-17 NOTE — Assessment & Plan Note (Signed)
S/p of I&D by ED physician.  Patient does not have fever and chills.  Does not meets criteria for sepsis. Wound cultures with MRSA, sensitive to clindamycin, tetracycline and vancomycin. General surgery was also consulted due to worsening pain but there was no indication for another I&D. -Continue with wound care

## 2021-11-17 NOTE — Progress Notes (Signed)
Progress Note   Patient: Samuel Molina JFH:[id number] DOB: Dec 21, 1960 DOA: 11/08/2021     8 DOS: the patient was seen and examined on 11/17/2021   Brief hospital course: 45M, hx of HTN, HLD, TIA, GERD, multiple psych issue (depression, anxiety, personal disorder, malingering, adjustment disorder), tobacco abuse, cocaine abuse, Bell's palsy, presented with multiple complaints including left-sided weakness numbness, suicidal ideation and left leg wound. Patient seen in the ED underwent lab work essentially unremarkable chest x-ray no acute finding CT head dense right MCA bifurcation without hemorrhage MRI brain negative for acute stroke CTA head and neck negative for LVO, psychiatry consulted patient was admitted.  Wound and blood cultures with MRSA.  Blood cultures also grew coagulase-negative staph and streptococcal species which is thought to be due to contaminant per ID. Repeat blood cultures on 11/10/2021 remain negative.  7/24: Patient had echocardiogram today which was found to be normal, TEE is scheduled for tomorrow.  7/25: Patient had normal TEE today, no sign of endocarditis.  He wants to stay in hospital to complete IV antibiotics.  Will appreciate ID recommendations.  7/26: ID is recommending 2 weeks of IV antibiotics and for which patient will remain in the hospital, Followed by 2 weeks of p.o. antibiotics.  Remained stable. Had a small fall overnight while walking independently, apparently hurt left knee but imaging was negative for any acute abnormality.  No other injuries.  7/27: Patient remained stable.  Patient will need 2 weeks of vancomycin until 11/23/2021 followed by 2 weeks of p.o. linezolid per ID recommendations.   Assessment and Plan: * Cellulitis and abscess of left leg S/p of I&D by ED physician.  Patient does not have fever and chills.  Does not meets criteria for sepsis. Wound cultures with MRSA, sensitive to clindamycin, tetracycline and vancomycin. General  surgery was also consulted due to worsening pain but there was no indication for another I&D. -Continue with wound care   MRSA bacteremia Initial blood cultures positive for MRSA most likely secondary to wound infection.  Also found to have Streptococcus species and coagulase-negative staph which was thought to be due to contaminant.  Repeat blood cultures on 11/10/2021 remain negative. Patient never met sepsis criteria. TTE and TEE was negative for endocarditis -2 weeks of IV vancomycin followed by 2 weeks of p.o. antibiotics per ID, vancomycin will be continued until 11/23/2021 followed by 2 weeks of linezolid  Left-sided weakness Etiology is not clear.  MRI of brain is negative for new stroke.  CTA negative for LVO.  Differential diagnosis include TIA versus malingering. -Aspirin, Lipitor   Stroke [facility]) Patient has history of stroke - On aspirin and Lipitor  Hypertensive urgency Blood pressure remained mildly elevated. Blood pressure 203/112 on admission.  This is due to medication noncompliance.  Patient is not taking blood pressure medications currently. -Increase the dose of amlodipine to 10 mg -Continue lisinopril -IV hydralazine as needed  HLD (hyperlipidemia) - Lipitor  Adjustment disorder with mixed disturbance of emotions and conduct Seen by psychiatrist-stable - Continue Celexa -Continue Seroquel 100 mg daily -Currently denies homicidal and homicidal ideations -No need for IVC or inpatient psych admission per psychiatry  Personality disorder ([place]) - See above  Depression with anxiety - See above  Suicidal ideation - Denies any suicidal ideation at this time  Cocaine abuse ([place]) - UDS done on 7/21 was negative. -Counseling was provided  Tobacco abuse - Nicotine patch   Subjective: Patient was seen and examined today.  No new complaints or concerns.  Physical Exam: Vitals:   11/16/21 1631 11/16/21 2045 11/17/21 0517 11/17/21 0800  BP: (!) 136/111  137/82 130/77 (!) 155/84  Pulse: 74 76 66 66  Resp: '18 18 18 16  ' Temp: 98.7 F (37.1 C) (!) 97.5 F (36.4 C) 98 F (36.7 C) 98.1 F (36.7 C)  TempSrc:      SpO2: 95% 100% 100% 100%  Weight:      Height:       General.     In no acute distress. Pulmonary.  Lungs clear bilaterally, normal respiratory effort. CV.  Regular rate and rhythm, no JVD, rub or murmur. Abdomen.  Soft, nontender, nondistended, BS positive. CNS.  Alert and oriented .  No focal neurologic deficit. Extremities.  No edema, no cyanosis, pulses intact and symmetrical. Psychiatry.  Judgment and insight appears normal.  Data Reviewed: Prior notes and labs reviewed  Family Communication: Discussed with patient  Disposition: Status is: Inpatient Remains inpatient appropriate because: To complete IV antibiotic   Planned Discharge Destination: Home  DVT prophylaxis.  Lovenox Time spent: 38 minutes  This record has been created using Systems analyst. Errors have been sought and corrected,but may not always be located. Such creation errors do not reflect on the standard of care.  Author: Lorella Nimrod, MD 11/17/2021 1:11 PM  For on call review [email or web address].

## 2021-11-18 DIAGNOSIS — K2100 Gastro-esophageal reflux disease with esophagitis, without bleeding: Secondary | ICD-10-CM

## 2021-11-18 DIAGNOSIS — B955 Unspecified streptococcus as the cause of diseases classified elsewhere: Secondary | ICD-10-CM | POA: Diagnosis not present

## 2021-11-18 DIAGNOSIS — B9561 Methicillin susceptible Staphylococcus aureus infection as the cause of diseases classified elsewhere: Secondary | ICD-10-CM | POA: Diagnosis not present

## 2021-11-18 DIAGNOSIS — F1910 Other psychoactive substance abuse, uncomplicated: Secondary | ICD-10-CM | POA: Diagnosis not present

## 2021-11-18 DIAGNOSIS — A4902 Methicillin resistant Staphylococcus aureus infection, unspecified site: Secondary | ICD-10-CM | POA: Diagnosis not present

## 2021-11-18 DIAGNOSIS — R2990 Unspecified symptoms and signs involving the nervous system: Secondary | ICD-10-CM | POA: Diagnosis not present

## 2021-11-18 DIAGNOSIS — R7881 Bacteremia: Secondary | ICD-10-CM | POA: Diagnosis not present

## 2021-11-18 DIAGNOSIS — B9562 Methicillin resistant Staphylococcus aureus infection as the cause of diseases classified elsewhere: Secondary | ICD-10-CM | POA: Diagnosis not present

## 2021-11-18 DIAGNOSIS — W19XXXA Unspecified fall, initial encounter: Secondary | ICD-10-CM

## 2021-11-18 MED ORDER — ONDANSETRON HCL 4 MG/2ML IJ SOLN: 4.0000 mg | Freq: Once | INTRAMUSCULAR | Status: DC

## 2021-11-18 MED ORDER — VANCOMYCIN HCL 1250 MG/250ML IV SOLN
1250.0000 mg | Freq: Once | INTRAVENOUS | Status: AC
  Administered 2021-11-18: 1250 mg via INTRAVENOUS
  Filled 2021-11-18: qty 250

## 2021-11-18 NOTE — Progress Notes (Signed)
   Date of Admission:  11/08/2021      ID: Samuel Molina is a 61 y.o. male with   Principal Problem:   Cellulitis and abscess of left leg Active Problems:   Cocaine abuse (HCC)   Personality disorder (HCC)   Stroke (HCC)   HLD (hyperlipidemia)   Tobacco abuse   Left-sided weakness   Adjustment disorder with mixed disturbance of emotions and conduct   Depression with anxiety   Hypertensive urgency   Suicidal ideation   MRSA bacteremia    Subjective: Patient has pain epigastrium Bilious vomiting Acid reflux Medications:   amLODipine  10 mg Oral Daily   aspirin EC  81 mg Oral Daily   atorvastatin  80 mg Oral QPM   citalopram  10 mg Oral Daily   enoxaparin (LOVENOX) injection  40 mg Subcutaneous Q24H   gabapentin  300 mg Oral TID   haloperidol lactate  2 mg Intravenous Once   lisinopril  40 mg Oral Daily   nicotine  21 mg Transdermal Daily   pantoprazole  40 mg Oral Daily   QUEtiapine  100 mg Oral QHS    Objective: Vital signs in last 24 hours: Temp:  [97.5 F (36.4 C)-98.6 F (37 C)] 98.6 F (37 C) (07/28 0807) Pulse Rate:  [62-77] 67 (07/28 0807) Resp:  [16-18] 18 (07/28 0453) BP: (131-156)/(71-87) 147/84 (07/28 0807) SpO2:  [96 %-100 %] 96 % (07/28 0807)   PHYSICAL EXAM:  General: Alert, cooperative,    Throat NAD Lungs: Clear to auscultation bilaterally. No Wheezing or Rhonchi. No rales. Heart: Regular rate and rhythm, no murmur, rub or gallop. Abdomen: Soft, epigastric tenderness Extremities: left thigh   Skin: No rashes or lesions. Or bruising Lymph: Cervical, supraclavicular normal. Neurologic: Grossly non-focal  Lab Results Recent Labs    11/16/21 0945  CREATININE 0.86   Microbiology: BC- MRSA WC- MRSA Studies/Results: No results found.   Assessment/Plan:  Gastritis and GERD- likely related to NSAID ibuprofen- also on aspirin Would DC ibuprofen If no improvement may then look at [facility] bladder with [place]  MRSA bacteremia ( Also  MSSA and Streptococcus in blood culture) TEE neg Repeat culture neg  Pt will need 2 weeks of IV vanco until 11/23/21 and then needs PO linezolid -Pt on celexa and seroquel- will have to monitor closely May have to do weekly dalbavancin X2  MRSA soft tissue infection left thigh S/p I/D in the ED Improving  Fall in the room and hurt his knee- no fracture  Substance use disorder  Discussed the management with patient and care team ID will follow him peripherally this weekend- call if needed

## 2021-11-18 NOTE — Progress Notes (Signed)
Progress Note   Patient: Samuel Molina FYB:[id number] DOB: [birth year] DOA: 11/08/2021     9 DOS: the patient was seen and examined on 11/18/2021   Brief hospital course: 6M, hx of HTN, HLD, TIA, GERD, multiple psych issue (depression, anxiety, personal disorder, malingering, adjustment disorder), tobacco abuse, cocaine abuse, Bell's palsy, presented with multiple complaints including left-sided weakness numbness, suicidal ideation and left leg wound. Patient seen in the ED underwent lab work essentially unremarkable chest x-ray no acute finding CT head dense right MCA bifurcation without hemorrhage MRI brain negative for acute stroke CTA head and neck negative for LVO, psychiatry consulted patient was admitted.  Wound and blood cultures with MRSA.  Blood cultures also grew coagulase-negative staph and streptococcal species which is thought to be due to contaminant per ID. Repeat blood cultures on 11/10/2021 remain negative.  7/24: Patient had echocardiogram today which was found to be normal, TEE is scheduled for tomorrow.  7/25: Patient had normal TEE today, no sign of endocarditis.  He wants to stay in hospital to complete IV antibiotics.  Will appreciate ID recommendations.  7/26: ID is recommending 2 weeks of IV antibiotics and for which patient will remain in the hospital, Followed by 2 weeks of p.o. antibiotics.  Remained stable. Had a small fall overnight while walking independently, apparently hurt left knee but imaging was negative for any acute abnormality.  No other injuries.  7/27: Patient remained stable.  Patient will need 2 weeks of vancomycin until 11/23/2021 followed by 2 weeks of p.o. linezolid per ID recommendations.  7/28: Patient remained stable, vancomycin till 11/23/2021 followed by 2 weeks of linezolid.   Assessment and Plan: * Cellulitis and abscess of left leg S/p of I&D by ED physician.  Patient does not have fever and chills.  Does not meets criteria for  sepsis. Wound cultures with MRSA, sensitive to clindamycin, tetracycline and vancomycin. General surgery was also consulted due to worsening pain but there was no indication for another I&D. -Continue with wound care   MRSA bacteremia Initial blood cultures positive for MRSA most likely secondary to wound infection.  Also found to have Streptococcus species and coagulase-negative staph which was thought to be due to contaminant.  Repeat blood cultures on 11/10/2021 remain negative. Patient never met sepsis criteria. TTE and TEE was negative for endocarditis -2 weeks of IV vancomycin followed by 2 weeks of p.o. antibiotics per ID, vancomycin will be continued until 11/23/2021 followed by 2 weeks of linezolid  Left-sided weakness Etiology is not clear.  MRI of brain is negative for new stroke.  CTA negative for LVO.  Differential diagnosis include TIA versus malingering. -Aspirin, Lipitor   Stroke [facility]) Patient has history of stroke - On aspirin and Lipitor  Hypertensive urgency Blood pressure remained mildly elevated. Blood pressure 203/112 on admission.  This is due to medication noncompliance.  Patient is not taking blood pressure medications currently. -Increase the dose of amlodipine to 10 mg -Continue lisinopril -IV hydralazine as needed  HLD (hyperlipidemia) - Lipitor  Adjustment disorder with mixed disturbance of emotions and conduct Seen by psychiatrist-stable - Continue Celexa -Continue Seroquel 100 mg daily -Currently denies homicidal and homicidal ideations -No need for IVC or inpatient psych admission per psychiatry  Personality disorder ([place]) - See above  Depression with anxiety - See above  Suicidal ideation - Denies any suicidal ideation at this time  Cocaine abuse ([place]) - UDS done on 7/21 was negative. -Counseling was provided  Tobacco abuse - Nicotine patch  Subjective: Patient was resting comfortably when seen today.  Coming up with different  complaints and wanted to have an EGD done while in the hospital, explained that he need to have it done as an outpatient if needed as there is no acute need.  Physical Exam: Vitals:   11/17/21 1602 11/17/21 2037 11/18/21 0453 11/18/21 0807  BP: (!) 144/87 (!) 156/71 131/87 (!) 147/84  Pulse: 62 77 71 67  Resp: _0 Temp: 97.9 F (36.6 C) (!) 97.5 F (36.4 C) 97.6 F (36.4 C) 98.6 F (37 C)  TempSrc: Oral Oral Oral   SpO2: 99% 100% 97% 96%  Weight:      Height:       General.     In no acute distress. Pulmonary.  Lungs clear bilaterally, normal respiratory effort. CV.  Regular rate and rhythm, no JVD, rub or murmur. Abdomen.  Soft, nontender, nondistended, BS positive. CNS.  Alert and oriented .  No focal neurologic deficit. Extremities.  No edema, no cyanosis, pulses intact and symmetrical. Psychiatry.  Judgment and insight appears normal.  Data Reviewed: Prior data reviewed.  Family Communication: Discussed with patient  Disposition: Status is: Inpatient Remains inpatient appropriate because: Need IV antibiotics until 11/23/2021   Planned Discharge Destination: Home  DVT prophylaxis.  Lovenox Time spent: 40 minutes  This record has been created using Systems analyst. Errors have been sought and corrected,but may not always be located. Such creation errors do not reflect on the standard of care.  Author: Lorella Nimrod, MD 11/18/2021 2:33 PM  For on call review [email or web address].

## 2021-11-18 NOTE — Progress Notes (Signed)
Pharmacy Antibiotic Note  Samuel Molina is a 61 y.o. male w/ PMH of HTN, stroke and functional left leg weakness admitted on 11/08/2021 with bacteremia related to cellulitis.  Pharmacy has been consulted for vancomycin dosing. EDP drained leg abscess but no culture sent.  Today, 11/18/2021 Day #11 vancomycin Renal: SCr stable (last check 7/26) WBC WNL Afebrile 7/18 Bcx: 1 set with MSSA, MRSA and strep species.  2nd set with MSSA 7/19 Thigh: MRSA 7/20 Bcx NGTD 7/25 TEE: no evidence of endocarditis  Vancomycin level 7/26  on '1500mg'$  IV q12h Vancomycin trough = 12 mcg/mL '@09'$ :45  Vancomycin level 7/21 (Note - patient refused vancomycin peak at 0200 7/21 Vancomycin '1500mg'$  IV q12h @ 22:47 Vancomycin trough = 12 mcg/ml @ 2956  Plan:   Based on most recent trough, continue vancomycin 1500 mg IV every 12 hours  Unable to calculate AUC as patient refused peak but he looks to be matching the calculated numbers below.   Goal AUC 400-600. Expected AUC: 443.2 SCr used: 0.85 mg/dL Ke: 0.092 h-1, T1/2 7.5 h  Monitor renal functon and recheck trough every 5-7 days F/u antibiotic plan with MSSA in blood cx and MRSA in wound cx Check SCr 7/29 am - ordered Follow plans to complete antibiotic course   Height: '6\' 1"'$  (185.4 cm) Weight: 104.3 kg (230 lb) IBW/kg (Calculated) : 79.9  Temp (24hrs), Avg:97.9 F (36.6 C), Min:97.5 F (36.4 C), Max:98.6 F (37 C)  Recent Labs  Lab 11/12/21 0615 11/13/21 0639 11/14/21 0621 11/15/21 0614 11/16/21 0945  WBC 10.2 9.6 8.5  --   --   CREATININE 0.97 0.84 0.79 0.85 0.86  VANCOTROUGH  --   --   --   --  12*     Estimated Creatinine Clearance: 117.3 mL/min (by C-G formula based on SCr of 0.86 mg/dL).    Allergies  Allergen Reactions   Acetaminophen Itching, Nausea And Vomiting and Rash    Antimicrobials this admission: 07/18 vancomycin >> 07/18 ceftriaxone x1   Microbiology results: 7/18 Bcx: 1 set with MSSA, MRSA and strep species. 2nd  set with MSSA 7/19 Thigh: MRSA 7/20 Bcx NGTD  Thank you for allowing pharmacy to be a part of this patient's care.  Doreene Eland, PharmD, BCPS, BCIDP Work Cell: [phone] 11/18/2021 9:22 AM

## 2021-11-19 DIAGNOSIS — B9561 Methicillin susceptible Staphylococcus aureus infection as the cause of diseases classified elsewhere: Secondary | ICD-10-CM | POA: Diagnosis not present

## 2021-11-19 DIAGNOSIS — R7881 Bacteremia: Secondary | ICD-10-CM | POA: Diagnosis not present

## 2021-11-19 DIAGNOSIS — R2990 Unspecified symptoms and signs involving the nervous system: Secondary | ICD-10-CM | POA: Diagnosis not present

## 2021-11-19 DIAGNOSIS — A4902 Methicillin resistant Staphylococcus aureus infection, unspecified site: Secondary | ICD-10-CM | POA: Diagnosis not present

## 2021-11-19 LAB — CBC
HCT: 40.5 % (ref 39.0–52.0)
Hemoglobin: 12.9 g/dL — ABNORMAL LOW (ref 13.0–17.0)
MCH: 26.1 pg (ref 26.0–34.0)
MCHC: 31.9 g/dL (ref 30.0–36.0)
MCV: 82 fL (ref 80.0–100.0)
Platelets: 238 10*3/uL (ref 150–400)
RBC: 4.94 MIL/uL (ref 4.22–5.81)
RDW: 15 % (ref 11.5–15.5)
WBC: 9.4 10*3/uL (ref 4.0–10.5)
nRBC: 0 % (ref 0.0–0.2)

## 2021-11-19 LAB — CREATININE, SERUM
Creatinine, Ser: 0.8 mg/dL (ref 0.61–1.24)
GFR, Estimated: >60 mL/min (ref >60)

## 2021-11-19 MED ORDER — DICYCLOMINE HCL 10 MG PO CAPS
10.0000 mg | ORAL_CAPSULE | Freq: Once | ORAL | Status: AC
  Administered 2021-11-19: 10 mg via ORAL
  Filled 2021-11-19: qty 1

## 2021-11-19 NOTE — Progress Notes (Signed)
Progress Note   Patient: Samuel Molina CHE:[id number] DOB: 01-Oct-1960 DOA: 11/08/2021     10 DOS: the patient was seen and examined on 11/19/2021   Brief hospital course: 73M, hx of HTN, HLD, TIA, GERD, multiple psych issue (depression, anxiety, personal disorder, malingering, adjustment disorder), tobacco abuse, cocaine abuse, Bell's palsy, presented with multiple complaints including left-sided weakness numbness, suicidal ideation and left leg wound. Patient seen in the ED underwent lab work essentially unremarkable chest x-ray no acute finding CT head dense right MCA bifurcation without hemorrhage MRI brain negative for acute stroke CTA head and neck negative for LVO, psychiatry consulted patient was admitted.  Wound and blood cultures with MRSA.  Blood cultures also grew coagulase-negative staph and streptococcal species which is thought to be due to contaminant per ID. Repeat blood cultures on 11/10/2021 remain negative.  7/24: Patient had echocardiogram today which was found to be normal, TEE is scheduled for tomorrow.  7/25: Patient had normal TEE today, no sign of endocarditis.  He wants to stay in hospital to complete IV antibiotics.  Will appreciate ID recommendations.  7/26: ID is recommending 2 weeks of IV antibiotics and for which patient will remain in the hospital, Followed by 2 weeks of p.o. antibiotics.  Remained stable. Had a small fall overnight while walking independently, apparently hurt left knee but imaging was negative for any acute abnormality.  No other injuries.  7/27: Patient remained stable.  Patient will need 2 weeks of vancomycin until 11/23/2021 followed by 2 weeks of p.o. linezolid per ID recommendations.  7/28: Patient remained stable, vancomycin till 11/23/2021 followed by 2 weeks of linezolid.  7/29: Remains stable, having some indigestion, refusing MiraLAX, we will try some Bentyl. Patient also saying that he was vomiting the whole night and nursing staff  were giving him medications.  No record of any of those incidents.  Patient only received 1 dose of Zofran around 9 PM.  No witnessed vomiting.   Assessment and Plan: * Cellulitis and abscess of left leg S/p of I&D by ED physician.  Patient does not have fever and chills.  Does not meets criteria for sepsis. Wound cultures with MRSA, sensitive to clindamycin, tetracycline and vancomycin. General surgery was also consulted due to worsening pain but there was no indication for another I&D. -Continue with wound care   MRSA bacteremia Initial blood cultures positive for MRSA most likely secondary to wound infection.  Also found to have Streptococcus species and coagulase-negative staph which was thought to be due to contaminant.  Repeat blood cultures on 11/10/2021 remain negative. Patient never met sepsis criteria. TTE and TEE was negative for endocarditis -2 weeks of IV vancomycin followed by 2 weeks of p.o. antibiotics per ID, vancomycin will be continued until 11/23/2021 followed by 2 weeks of linezolid  Left-sided weakness Etiology is not clear.  MRI of brain is negative for new stroke.  CTA negative for LVO.  Differential diagnosis include TIA versus malingering. -Aspirin, Lipitor   Stroke [facility]) Patient has history of stroke - On aspirin and Lipitor  Hypertensive urgency Blood pressure remained mildly elevated. Blood pressure 203/112 on admission.  This is due to medication noncompliance.  Patient is not taking blood pressure medications currently. -Increase the dose of amlodipine to 10 mg -Continue lisinopril -IV hydralazine as needed  HLD (hyperlipidemia) - Lipitor  Adjustment disorder with mixed disturbance of emotions and conduct Seen by psychiatrist-stable - Continue Celexa -Continue Seroquel 100 mg daily -Currently denies homicidal and homicidal ideations -No need for  IVC or inpatient psych admission per psychiatry  Personality disorder [facility]) - See above  Depression  with anxiety - See above  Suicidal ideation - Denies any suicidal ideation at this time  Cocaine abuse ([place]) - UDS done on 7/21 was negative. -Counseling was provided  Tobacco abuse - Nicotine patch   Subjective: Patient was seen and examined today.  He is complaining of some belching and indigestion.  Per patient he was vomiting the whole night, no witnessed vomiting.  Physical Exam: Vitals:   11/18/21 2115 11/19/21 0455 11/19/21 0746 11/19/21 1528  BP: (!) 152/66 (!) 144/91 135/62 137/65  Pulse: 74 70 66 65  Resp: '20 18 19 19  ' Temp: 98.2 F (36.8 C) 97.6 F (36.4 C) 98 F (36.7 C) 98.4 F (36.9 C)  TempSrc: Oral Oral    SpO2: 100% 100% 100% 100%  Weight:      Height:       General.     In no acute distress. Pulmonary.  Lungs clear bilaterally, normal respiratory effort. CV.  Regular rate and rhythm, no JVD, rub or murmur. Abdomen.  Soft, nontender, nondistended, BS positive. CNS.  Alert and oriented .  No focal neurologic deficit. Extremities.  No edema, no cyanosis, pulses intact and symmetrical. Psychiatry.  Judgment and insight appears normal.  Data Reviewed: Prior data reviewed.  Family Communication: Discussed with patient  Disposition: Status is: Inpatient Remains inpatient appropriate because: Severity of illness, need to complete IV antibiotics until 11/23/2021   Planned Discharge Destination: Home  DVT prophylaxis.  Lovenox Time spent: 42 minutes  This record has been created using Systems analyst. Errors have been sought and corrected,but may not always be located. Such creation errors do not reflect on the standard of care.  Author: Lorella Nimrod, MD 11/19/2021 4:42 PM  For on call review [email or web address].

## 2021-11-20 ENCOUNTER — Inpatient Hospital Stay: Payer: 59

## 2021-11-20 DIAGNOSIS — B9561 Methicillin susceptible Staphylococcus aureus infection as the cause of diseases classified elsewhere: Secondary | ICD-10-CM | POA: Diagnosis not present

## 2021-11-20 DIAGNOSIS — R7881 Bacteremia: Secondary | ICD-10-CM | POA: Diagnosis not present

## 2021-11-20 DIAGNOSIS — A4902 Methicillin resistant Staphylococcus aureus infection, unspecified site: Secondary | ICD-10-CM | POA: Diagnosis not present

## 2021-11-20 DIAGNOSIS — R2990 Unspecified symptoms and signs involving the nervous system: Secondary | ICD-10-CM | POA: Diagnosis not present

## 2021-11-20 MED ORDER — POLYETHYLENE GLYCOL 3350 17 G PO PACK
17.0000 g | PACK | Freq: Every day | ORAL | Status: DC
  Administered 2021-11-20 – 2021-11-22 (×3): 17 g via ORAL
  Filled 2021-11-20 (×4): qty 1

## 2021-11-20 MED ORDER — PHENOL 1.4 % MT LIQD
1.0000 | OROMUCOSAL | Status: DC | PRN
  Administered 2021-11-20: 1 via OROMUCOSAL
  Filled 2021-11-20: qty 177

## 2021-11-20 MED ORDER — GUAIFENESIN-DM 100-10 MG/5ML PO SYRP
5.0000 mL | ORAL_SOLUTION | ORAL | Status: DC | PRN
  Administered 2021-11-20 – 2021-11-24 (×5): 5 mL via ORAL
  Filled 2021-11-20 (×5): qty 10

## 2021-11-20 MED ORDER — LORATADINE 10 MG PO TABS
10.0000 mg | ORAL_TABLET | Freq: Every day | ORAL | Status: DC | PRN
  Administered 2021-11-20 – 2021-11-24 (×5): 10 mg via ORAL
  Filled 2021-11-20 (×5): qty 1

## 2021-11-20 NOTE — Progress Notes (Signed)
       CROSS COVER NOTE  NAME: Samuel Molina MRN: [id number] DOB : 02-[name]-1960  Notified by nursing of RED MED refusal by patient. [name] has declined to receive his scheduled 2200 IV Vancomycin dose tonight.  This document was prepared using Dragon voice recognition software and may include unintentional dictation errors.  Neomia Glass DNP, MHA, FNP-BC Nurse Practitioner [facility] Pager [phone]

## 2021-11-20 NOTE — Progress Notes (Signed)
Progress Note   Patient: Samuel Molina MWN:[id number] DOB: 01/08/1961 DOA: 11/08/2021     11 DOS: the patient was seen and examined on 11/20/2021   Brief hospital course: 55M, hx of HTN, HLD, TIA, GERD, multiple psych issue (depression, anxiety, personal disorder, malingering, adjustment disorder), tobacco abuse, cocaine abuse, Bell's palsy, presented with multiple complaints including left-sided weakness numbness, suicidal ideation and left leg wound. Patient seen in the ED underwent lab work essentially unremarkable chest x-ray no acute finding CT head dense right MCA bifurcation without hemorrhage MRI brain negative for acute stroke CTA head and neck negative for LVO, psychiatry consulted patient was admitted.  Wound and blood cultures with MRSA.  Blood cultures also grew coagulase-negative staph and streptococcal species which is thought to be due to contaminant per ID. Repeat blood cultures on 11/10/2021 remain negative.  7/24: Patient had echocardiogram today which was found to be normal, TEE is scheduled for tomorrow.  7/25: Patient had normal TEE today, no sign of endocarditis.  He wants to stay in hospital to complete IV antibiotics.  Will appreciate ID recommendations.  7/26: ID is recommending 2 weeks of IV antibiotics and for which patient will remain in the hospital, Followed by 2 weeks of p.o. antibiotics.  Remained stable. Had a small fall overnight while walking independently, apparently hurt left knee but imaging was negative for any acute abnormality.  No other injuries.  7/27: Patient remained stable.  Patient will need 2 weeks of vancomycin until 11/23/2021 followed by 2 weeks of p.o. linezolid per ID recommendations.  7/28: Patient remained stable, vancomycin till 11/23/2021 followed by 2 weeks of linezolid.  7/29: Remains stable, having some indigestion, refusing MiraLAX, we will try some Bentyl. Patient also saying that he was vomiting the whole night and nursing staff  were giving him medications.  No record of any of those incidents.  Patient only received 1 dose of Zofran around 9 PM.  No witnessed vomiting.  7/30: Abdominal x-ray was obtained with his persistent complaint of abdominal pain and distention, no noted abnormality noted except significant amount of stool and gas in colon and rectum.  Started on MiraLAX.   Assessment and Plan: * Cellulitis and abscess of left leg S/p of I&D by ED physician.  Patient does not have fever and chills.  Does not meets criteria for sepsis. Wound cultures with MRSA, sensitive to clindamycin, tetracycline and vancomycin. General surgery was also consulted due to worsening pain but there was no indication for another I&D. -Continue with wound care   MRSA bacteremia Initial blood cultures positive for MRSA most likely secondary to wound infection.  Also found to have Streptococcus species and coagulase-negative staph which was thought to be due to contaminant.  Repeat blood cultures on 11/10/2021 remain negative. Patient never met sepsis criteria. TTE and TEE was negative for endocarditis -2 weeks of IV vancomycin followed by 2 weeks of p.o. antibiotics per ID, vancomycin will be continued until 11/23/2021 followed by 2 weeks of linezolid  Left-sided weakness Etiology is not clear.  MRI of brain is negative for new stroke.  CTA negative for LVO.  Differential diagnosis include TIA versus malingering. -Aspirin, Lipitor   Stroke [facility]) Patient has history of stroke - On aspirin and Lipitor  Hypertensive urgency Blood pressure remained mildly elevated. Blood pressure 203/112 on admission.  This is due to medication noncompliance.  Patient is not taking blood pressure medications currently. -Increase the dose of amlodipine to 10 mg -Continue lisinopril -IV hydralazine as needed  HLD (hyperlipidemia) - Lipitor  Adjustment disorder with mixed disturbance of emotions and conduct Seen by psychiatrist-stable -  Continue Celexa -Continue Seroquel 100 mg daily -Currently denies homicidal and homicidal ideations -No need for IVC or inpatient psych admission per psychiatry  Personality disorder ([place]) - See above  Depression with anxiety - See above  Suicidal ideation - Denies any suicidal ideation at this time  Cocaine abuse ([place]) - UDS done on 7/21 was negative. -Counseling was provided  Tobacco abuse - Nicotine patch   Subjective: Patient is complaining of belly pain and some distention.  Again asking for EGD.  Physical Exam: Vitals:   11/19/21 0746 11/19/21 1528 11/20/21 0524 11/20/21 0841  BP: 135/62 137/65 (!) 114/59 (!) 154/101  Pulse: 66 65 72 60  Resp: _0 Temp: 98 F (36.7 C) 98.4 F (36.9 C) 98.2 F (36.8 C) 98.1 F (36.7 C)  TempSrc:   Oral   SpO2: 100% 100% 100% 100%  Weight:      Height:       General.  Obese gentleman, in no acute distress. Pulmonary.  Lungs clear bilaterally, normal respiratory effort. CV.  Regular rate and rhythm, no JVD, rub or murmur. Abdomen.  Soft, nontender, nondistended, BS positive. CNS.  Alert and oriented .  No focal neurologic deficit. Extremities.  No edema, no cyanosis, pulses intact and symmetrical. Psychiatry.  Judgment and insight appears normal.  Data Reviewed: Prior data reviewed  Family Communication: Discussed with patient  Disposition: Status is: Inpatient Remains inpatient appropriate because: Severity of illness, need to complete IV antibiotics until 11/23/2021   Planned Discharge Destination: Home  DVT prophylaxis.  Lovenox Time spent: 38 minutes  This record has been created using Systems analyst. Errors have been sought and corrected,but may not always be located. Such creation errors do not reflect on the standard of care.  Author: Lorella Nimrod, MD 11/20/2021 2:42 PM  For on call review [email or web address].

## 2021-11-20 NOTE — Progress Notes (Signed)
   11/19/21 1945  Mobility  HOB Elevated/Bed Position Self regulated  Activity Ambulated independently in room;Ambulated independently to bathroom  Range of Motion/Exercises Active;All extremities  Level of Assistance Independent  Assistive Device None  Distance Ambulated (ft) 25 ft  Activity Response Tolerated well

## 2021-11-20 NOTE — Progress Notes (Addendum)
11/20/2021 at 2121:  Pt refused scheduled 2200 IV Vancomycin. Pt stated, "my arm is sore and I don't want that antibiotic." Pt educated. Pt is resting and call bell within reach. Provider on call notified.

## 2021-11-21 DIAGNOSIS — B9562 Methicillin resistant Staphylococcus aureus infection as the cause of diseases classified elsewhere: Secondary | ICD-10-CM | POA: Diagnosis not present

## 2021-11-21 DIAGNOSIS — L02416 Cutaneous abscess of left lower limb: Secondary | ICD-10-CM | POA: Diagnosis not present

## 2021-11-21 DIAGNOSIS — R7881 Bacteremia: Secondary | ICD-10-CM | POA: Diagnosis not present

## 2021-11-21 DIAGNOSIS — L03116 Cellulitis of left lower limb: Secondary | ICD-10-CM | POA: Diagnosis not present

## 2021-11-21 LAB — CBC
HCT: 43.8 % (ref 39.0–52.0)
Hemoglobin: 13.7 g/dL (ref 13.0–17.0)
MCH: 25.7 pg — ABNORMAL LOW (ref 26.0–34.0)
MCHC: 31.3 g/dL (ref 30.0–36.0)
MCV: 82 fL (ref 80.0–100.0)
Platelets: 288 10*3/uL (ref 150–400)
RBC: 5.34 MIL/uL (ref 4.22–5.81)
RDW: 14.7 % (ref 11.5–15.5)
WBC: 10.4 10*3/uL (ref 4.0–10.5)
nRBC: 0 % (ref 0.0–0.2)

## 2021-11-21 LAB — BASIC METABOLIC PANEL
Anion gap: 11 (ref 5–15)
BUN: 15 mg/dL (ref 6–20)
CO2: 27 mmol/L (ref 22–32)
Calcium: 9.1 mg/dL (ref 8.9–10.3)
Chloride: 101 mmol/L (ref 98–111)
Creatinine, Ser: 0.72 mg/dL (ref 0.61–1.24)
GFR, Estimated: >60 mL/min (ref >60)
Glucose, Bld: 134 mg/dL — ABNORMAL HIGH (ref 70–99)
Potassium: 4 mmol/L (ref 3.5–5.1)
Sodium: 139 mmol/L (ref 135–145)

## 2021-11-21 MED ORDER — ENOXAPARIN SODIUM 60 MG/0.6ML IJ SOSY
0.5000 mg/kg | PREFILLED_SYRINGE | INTRAMUSCULAR | Status: DC
  Administered 2021-11-22: 52.5 mg via SUBCUTANEOUS
  Filled 2021-11-21: qty 0.6

## 2021-11-21 NOTE — Progress Notes (Signed)
Progress Note   Patient: Samuel Molina FIE:[id number] DOB: 04/20/1961 DOA: 11/08/2021     12 DOS: the patient was seen and examined on 11/21/2021   Brief hospital course: 13M, hx of HTN, HLD, TIA, GERD, multiple psych issue (depression, anxiety, personal disorder, malingering, adjustment disorder), tobacco abuse, cocaine abuse, Bell's palsy, presented with multiple complaints including left-sided weakness numbness, suicidal ideation and left leg wound. Patient seen in the ED underwent lab work essentially unremarkable chest x-ray no acute finding CT head dense right MCA bifurcation without hemorrhage MRI brain negative for acute stroke CTA head and neck negative for LVO, psychiatry consulted patient was admitted.  Wound and blood cultures with MRSA.  Blood cultures also grew coagulase-negative staph and streptococcal species which is thought to be due to contaminant per ID. Repeat blood cultures on 11/10/2021 remain negative.  7/24: Patient had echocardiogram today which was found to be normal, TEE is scheduled for tomorrow.  7/25: Patient had normal TEE today, no sign of endocarditis.  He wants to stay in hospital to complete IV antibiotics.  Will appreciate ID recommendations.  7/26: ID is recommending 2 weeks of IV antibiotics and for which patient will remain in the hospital, Followed by 2 weeks of p.o. antibiotics.  Remained stable. Had a small fall overnight while walking independently, apparently hurt left knee but imaging was negative for any acute abnormality.  No other injuries.  7/27: Patient remained stable.  Patient will need 2 weeks of vancomycin until 11/23/2021 followed by 2 weeks of p.o. linezolid per ID recommendations.  7/28: Patient remained stable, vancomycin till 11/23/2021 followed by 2 weeks of linezolid.  7/29: Remains stable, having some indigestion, refusing MiraLAX, we will try some Bentyl. Patient also saying that he was vomiting the whole night and nursing staff  were giving him medications.  No record of any of those incidents.  Patient only received 1 dose of Zofran around 9 PM.  No witnessed vomiting.  7/30: Abdominal x-ray was obtained with his persistent complaint of abdominal pain and distention, no noted abnormality noted except significant amount of stool and gas in colon and rectum.  Started on MiraLAX.  7/31: Patient continued to complain about indigestion and saying that he vomited multiple times, no witnessed vomit and he never received any thing to be seen by nursing staff. He was also not convinced about abdominal x-ray with concern of constipation. Keep saying that he wants to have scopes done while in the hospital, again explained that issue needs to be addressed as an outpatient as there is no emergent need. Hemoglobin improving and stable.  Labs are stable. Refused last night dose of vancomycin stating that his arm is hurting. Plan is to still complete vancomycin on 11/23/2021 and discharged on linezolid for another 2 weeks.   Assessment and Plan: * Cellulitis and abscess of left leg S/p of I&D by ED physician.  Patient does not have fever and chills.  Does not meets criteria for sepsis. Wound cultures with MRSA, sensitive to clindamycin, tetracycline and vancomycin. General surgery was also consulted due to worsening pain but there was no indication for another I&D. -Continue with wound care   MRSA bacteremia Initial blood cultures positive for MRSA most likely secondary to wound infection.  Also found to have Streptococcus species and coagulase-negative staph which was thought to be due to contaminant.  Repeat blood cultures on 11/10/2021 remain negative. Patient never met sepsis criteria. TTE and TEE was negative for endocarditis -2 weeks of IV vancomycin followed  by 2 weeks of p.o. antibiotics per ID, vancomycin will be continued until 11/23/2021 followed by 2 weeks of linezolid  Left-sided weakness Etiology is not clear.  MRI of  brain is negative for new stroke.  CTA negative for LVO.  Differential diagnosis include TIA versus malingering. -Aspirin, Lipitor   Stroke [facility]) Patient has history of stroke - On aspirin and Lipitor  Hypertensive urgency Blood pressure remained mildly elevated. Blood pressure 203/112 on admission.  This is due to medication noncompliance.  Patient is not taking blood pressure medications currently. -Increase the dose of amlodipine to 10 mg -Continue lisinopril -IV hydralazine as needed  HLD (hyperlipidemia) - Lipitor  Adjustment disorder with mixed disturbance of emotions and conduct Seen by psychiatrist-stable - Continue Celexa -Continue Seroquel 100 mg daily -Currently denies homicidal and homicidal ideations -No need for IVC or inpatient psych admission per psychiatry  Personality disorder ([place]) - See above  Depression with anxiety - See above  Suicidal ideation - Denies any suicidal ideation at this time  Cocaine abuse ([place]) - UDS done on 7/21 was negative. -Counseling was provided  Tobacco abuse - Nicotine patch   Subjective: Patient was seen and examined today.  He seems very agitated when explained abdominal x-ray shows constipation.  Physical Exam: Vitals:   11/20/21 2032 11/21/21 0532 11/21/21 0843 11/21/21 1623  BP: (!) 126/112 126/82 (!) 143/82 132/64  Pulse: 73 74 71 69  Resp: '16 16 18 18  ' Temp: 98 F (36.7 C) 97.9 F (36.6 C) 98.5 F (36.9 C) 98.1 F (36.7 C)  TempSrc:      SpO2: 100% 100% 100% 100%  Weight:      Height:       General.     In no acute distress. Pulmonary.  Lungs clear bilaterally, normal respiratory effort. CV.  Regular rate and rhythm, no JVD, rub or murmur. Abdomen.  Soft, nontender, nondistended, BS positive. CNS.  Alert and oriented .  No focal neurologic deficit. Extremities.  No edema, no cyanosis, pulses intact and symmetrical. Psychiatry.  Judgment and insight appears normal.  Data Reviewed: Prior data  reviewed.  Family Communication:   Disposition: Status is: Inpatient Remains inpatient appropriate because: Severity of illness, need to complete IV antibiotics for 2 more days.   Planned Discharge Destination: Home  Time spent: 40 minutes  This record has been created using Systems analyst. Errors have been sought and corrected,but may not always be located. Such creation errors do not reflect on the standard of care.  Author: Lorella Nimrod, MD 11/21/2021 4:31 PM  For on call review [email or web address].

## 2021-11-21 NOTE — Assessment & Plan Note (Signed)
S/p of I&D by ED physician.  Patient does not have fever and chills.  Does not meets criteria for sepsis. Wound cultures with MRSA, sensitive to clindamycin, tetracycline and vancomycin. General surgery was also consulted due to worsening pain but there was no indication for another I&D. -Continue with wound care

## 2021-11-21 NOTE — Progress Notes (Signed)
Pharmacy Antibiotic Note  Samuel Molina is a 61 y.o. male w/ PMH of HTN, stroke and functional left leg weakness admitted on 11/08/2021 with bacteremia related to cellulitis.  Pharmacy has been consulted for vancomycin dosing. EDP drained leg abscess but no culture sent.  Today, 11/21/2021 Day #14 vancomycin Renal: SCr stable (last check 7/31) WBC WNL Afebrile 7/18 Bcx: 1 set with MSSA, MRSA and strep species.  2nd set with MSSA 7/19 Thigh: MRSA 7/20 Bcx NGTD 7/25 TEE: no evidence of endocarditis  Vancomycin level 7/26  on '1500mg'$  IV q12h Vancomycin trough = 12 mcg/mL '@09'$ :45  Vancomycin level 7/21 (Note - patient refused vancomycin peak at 0200 7/21 Vancomycin '1500mg'$  IV q12h @ 22:47 Vancomycin trough = 12 mcg/ml @ 9977  Plan:   Based on most recent trough, continue vancomycin 1500 mg IV every 12 hours  Unable to calculate AUC as patient refused peak but he looks to be matching the calculated numbers below.   Goal AUC 400-600. Expected AUC: 443.2 SCr used: 0.85 mg/dL Ke: 0.092 h-1, T1/2 7.5 h  Monitor renal functon and recheck trough every 5-7 days F/u antibiotic plan with MSSA in blood cx and MRSA in wound cx Check SCr 7/29 am - ordered Follow plans to complete antibiotic course   Height: '6\' 1"'$  (185.4 cm) Weight: 104.3 kg (230 lb) IBW/kg (Calculated) : 79.9  Temp (24hrs), Avg:98.4 F (36.9 C), Min:97.9 F (36.6 C), Max:99.1 F (37.3 C)  Recent Labs  Lab 11/15/21 0614 11/16/21 0945 11/19/21 0702 11/21/21 0852  WBC  --   --  9.4 10.4  CREATININE 0.85 0.86 0.80 0.72  VANCOTROUGH  --  12*  --   --      Estimated Creatinine Clearance: 126.1 mL/min (by C-G formula based on SCr of 0.72 mg/dL).    Allergies  Allergen Reactions   Acetaminophen Itching, Nausea And Vomiting and Rash    Antimicrobials this admission: 07/18 vancomycin >> 07/18 ceftriaxone x1   Microbiology results: 7/18 Bcx: 1 set with MSSA, MRSA and strep species. 2nd set with MSSA 7/19 Thigh:  MRSA 7/20 Bcx NGTD  Thank you for allowing pharmacy to be a part of this patient's care.  Doreene Eland, PharmD, BCPS, BCIDP Work Cell: [phone] 11/21/2021 1:34 PM

## 2021-11-21 NOTE — Plan of Care (Signed)

## 2021-11-21 NOTE — Progress Notes (Signed)
PHARMACIST - PHYSICIAN COMMUNICATION  CONCERNING:  Enoxaparin (Lovenox) for DVT Prophylaxis    RECOMMENDATION: Patient was prescribed enoxaprin '40mg'$  q24 hours for VTE prophylaxis.   Filed Weights   11/08/21 0532 11/15/21 1251  Weight: 102 kg (224 lb 13.9 oz) 104.3 kg (230 lb)    Body mass index is 30.34 kg/m.  Estimated Creatinine Clearance: 126.1 mL/min (by C-G formula based on SCr of 0.72 mg/dL).   Based on [place] patient is candidate for enoxaparin 0.'5mg'$ /kg TBW SQ every 24 hours based on BMI being >30.   DESCRIPTION: Pharmacy has adjusted enoxaparin dose per [facility] policy.  Patient is now receiving enoxaparin 0.5 mg/kg every 24 hours    Wynelle Cleveland, PharmD Clinical Pharmacist  11/21/2021 12:10 PM

## 2021-11-21 NOTE — Assessment & Plan Note (Signed)
Initial blood cultures positive for MRSA most likely secondary to wound infection.  Also found to have Streptococcus species and coagulase-negative staph which was thought to be due to contaminant.  Repeat blood cultures on 11/10/2021 remain negative. Patient never met sepsis criteria. TTE and TEE was negative for endocarditis -2 weeks of IV vancomycin followed by 2 weeks of p.o. antibiotics per ID, vancomycin will be continued until 11/23/2021 followed by 2 weeks of linezolid

## 2021-11-21 NOTE — Progress Notes (Signed)
   Date of Admission:  11/08/2021      ID: Samuel Molina is a 61 y.o. male with   Principal Problem:   Cellulitis and abscess of left leg Active Problems:   Cocaine abuse (HCC)   Personality disorder (HCC)   Stroke (HCC)   HLD (hyperlipidemia)   Tobacco abuse   Left-sided weakness   Adjustment disorder with mixed disturbance of emotions and conduct   Depression with anxiety   Hypertensive urgency   Suicidal ideation   MRSA bacteremia    Subjective: C/o abdominal distension Passing blood and voming blood he says Has been burping a lot Medications:   amLODipine  10 mg Oral Daily   aspirin EC  81 mg Oral Daily   atorvastatin  80 mg Oral QPM   citalopram  10 mg Oral Daily   [START ON 11/22/2021] enoxaparin (LOVENOX) injection  0.5 mg/kg Subcutaneous Q24H   gabapentin  300 mg Oral TID   haloperidol lactate  2 mg Intravenous Once   lisinopril  40 mg Oral Daily   nicotine  21 mg Transdermal Daily   pantoprazole  40 mg Oral Daily   polyethylene glycol  17 g Oral Daily   QUEtiapine  100 mg Oral QHS    Objective: Vital signs in last 24 hours: Temp:  [97.9 F (36.6 C)-99.1 F (37.3 C)] 98.5 F (36.9 C) (07/31 0843) Pulse Rate:  [71-81] 71 (07/31 0843) Resp:  [16-19] 18 (07/31 0843) BP: (126-144)/(71-112) 143/82 (07/31 0843) SpO2:  [96 %-100 %] 100 % (07/31 0843)   PHYSICAL EXAM:  General: Alert, cooperative,    Throat NAD Lungs: Clear to auscultation bilaterally. No Wheezing or Rhonchi. No rales. Heart: Regular rate and rhythm, no murmur, rub or gallop. Abdomen: Soft,obese  Extremities: left thigh   Skin: No rashes or lesions. Or bruising Lymph: Cervical, supraclavicular normal. Neurologic: Grossly non-focal  Lab Results Recent Labs    11/19/21 0702 11/21/21 0852  WBC 9.4 10.4  HGB 12.9* 13.7  HCT 40.5 43.8  NA  --  139  K  --  4.0  CL  --  101  CO2  --  27  BUN  --  15  CREATININE 0.80 0.72   Microbiology: BC- MRSA WC-  MRSA Studies/Results: DG Abd 2 Views  Result Date: 11/20/2021 CLINICAL DATA:  Acute abdominal pain. EXAM: ABDOMEN - 2 VIEW COMPARISON:  09/04/2017 radiographs FINDINGS: No dilated bowel loops are noted. Gas and stool within the colon and rectum are noted. Nondistended small bowel is noted. No suspicious calcifications are present. There is no evidence of pneumoperitoneum. No acute bony abnormalities are present. IMPRESSION: No acute abnormality.  Unremarkable bowel gas pattern. Electronically Signed   By: Margarette Canada M.D.   On: 11/20/2021 14:32     Assessment/Plan:  Gastritis and GERD- likely related to NSAID ibuprofen- also on aspirin DC ibuprofen Has frequent burping= wonder whether he swallows air  Wants GI to see him and do a scope Hb stable   MRSA bacteremia ( Also MSSA and Streptococcus in blood culture) TEE neg Repeat culture neg  Pt will need 2 weeks of IV vanco until 11/23/21 and then needs PO linezolid -Pt on celexa and seroquel- hence will not do linezolid  May have to do weekly dalbavancin '1500mg'$ /X2  MRSA soft tissue infection left thigh S/p I/D in the ED Improving   Substance use disorder  Discussed the management with patient and hospitalist

## 2021-11-22 DIAGNOSIS — R7881 Bacteremia: Secondary | ICD-10-CM | POA: Diagnosis not present

## 2021-11-22 DIAGNOSIS — R1013 Epigastric pain: Secondary | ICD-10-CM

## 2021-11-22 DIAGNOSIS — B9562 Methicillin resistant Staphylococcus aureus infection as the cause of diseases classified elsewhere: Secondary | ICD-10-CM | POA: Diagnosis not present

## 2021-11-22 DIAGNOSIS — K2901 Acute gastritis with bleeding: Secondary | ICD-10-CM | POA: Diagnosis not present

## 2021-11-22 DIAGNOSIS — K625 Hemorrhage of anus and rectum: Secondary | ICD-10-CM

## 2021-11-22 DIAGNOSIS — L03116 Cellulitis of left lower limb: Secondary | ICD-10-CM | POA: Diagnosis not present

## 2021-11-22 LAB — BASIC METABOLIC PANEL
Anion gap: 8 (ref 5–15)
BUN: 18 mg/dL (ref 6–20)
CO2: 27 mmol/L (ref 22–32)
Calcium: 8.8 mg/dL — ABNORMAL LOW (ref 8.9–10.3)
Chloride: 103 mmol/L (ref 98–111)
Creatinine, Ser: 0.83 mg/dL (ref 0.61–1.24)
GFR, Estimated: >60 mL/min (ref >60)
Glucose, Bld: 163 mg/dL — ABNORMAL HIGH (ref 70–99)
Potassium: 4.2 mmol/L (ref 3.5–5.1)
Sodium: 138 mmol/L (ref 135–145)

## 2021-11-22 LAB — CBC
HCT: 43.8 % (ref 39.0–52.0)
Hemoglobin: 13.7 g/dL (ref 13.0–17.0)
MCH: 25.9 pg — ABNORMAL LOW (ref 26.0–34.0)
MCHC: 31.3 g/dL (ref 30.0–36.0)
MCV: 82.8 fL (ref 80.0–100.0)
Platelets: 280 10*3/uL (ref 150–400)
RBC: 5.29 MIL/uL (ref 4.22–5.81)
RDW: 14.6 % (ref 11.5–15.5)
WBC: 9.4 10*3/uL (ref 4.0–10.5)
nRBC: 0 % (ref 0.0–0.2)

## 2021-11-22 MED ORDER — PEG 3350-KCL-NA BICARB-NACL 420 G PO SOLR
4000.0000 mL | Freq: Once | ORAL | Status: AC
  Administered 2021-11-22: 4000 mL via ORAL
  Filled 2021-11-22: qty 4000

## 2021-11-22 MED ORDER — DEXTROSE 5 % IV SOLN: 1000.0000 mg | Freq: Once | INTRAVENOUS | 0 refills | Status: AC

## 2021-11-22 MED ORDER — DEXTROSE 5 % IV SOLN: 1500.0000 mg | Freq: Once | INTRAVENOUS | 0 refills | Status: AC

## 2021-11-22 MED ORDER — DEXTROSE 5 % IV SOLN: 1500.0000 mg | INTRAVENOUS | 0 refills | Status: DC

## 2021-11-22 MED ORDER — TRAMADOL HCL 50 MG PO TABS: 50.0000 mg | ORAL_TABLET | Freq: Four times a day (QID) | ORAL | Status: DC | PRN

## 2021-11-22 MED ORDER — SODIUM CHLORIDE 0.9 % IV SOLN: INTRAVENOUS | Status: DC

## 2021-11-22 MED ORDER — PANTOPRAZOLE SODIUM 40 MG PO TBEC
40.0000 mg | DELAYED_RELEASE_TABLET | Freq: Two times a day (BID) | ORAL | Status: DC
  Administered 2021-11-22 – 2021-11-24 (×3): 40 mg via ORAL
  Filled 2021-11-22 (×3): qty 1

## 2021-11-22 NOTE — Progress Notes (Signed)
Patient did just have a episode of emesis. It was a large amount of mucous, undigested food and it was pink in color with slight tinges of red. Made MD aware of same.

## 2021-11-22 NOTE — Treatment Plan (Signed)
Diagnosis: MRSA bacteremia and MRSA soft tissue infection Baseline Creatinine <1    Allergies  Allergen Reactions   Acetaminophen Itching, Nausea And Vomiting and Rash    OPAT Orders Discharge antibiotics: 1) Dalbavancin '1500mg'$  IVPB on 11/24/21 thru peripheral line  CBC with diff CMP on 11/29/21  2) Dalbavancin 2 nd dose '1000mg'$  IV PB on 12/01/21   Fax lab results  promptly to Dr.Javonnie Illescas [phone]  Clinic Follow Up Appt: 12/06/21 at 9.30 am   Call [phone] with any critical values

## 2021-11-22 NOTE — Progress Notes (Signed)
Progress Note   Patient: Samuel Molina VOJ:[id number] DOB: February 19, 1961 DOA: 11/08/2021     13 DOS: the patient was seen and examined on 11/22/2021   Brief hospital course: 68M, hx of HTN, HLD, TIA, GERD, multiple psych issue (depression, anxiety, personal disorder, malingering, adjustment disorder), tobacco abuse, cocaine abuse, Bell's palsy, presented with multiple complaints including left-sided weakness numbness, suicidal ideation and left leg wound. Patient seen in the ED underwent lab work essentially unremarkable chest x-ray no acute finding CT head dense right MCA bifurcation without hemorrhage MRI brain negative for acute stroke CTA head and neck negative for LVO, psychiatry consulted patient was admitted.  Wound and blood cultures with MRSA.  Blood cultures also grew coagulase-negative staph and streptococcal species which is thought to be due to contaminant per ID. Repeat blood cultures on 11/10/2021 remain negative.  7/24: Patient had echocardiogram today which was found to be normal, TEE is scheduled for tomorrow.  7/25: Patient had normal TEE today, no sign of endocarditis.  He wants to stay in hospital to complete IV antibiotics.  Will appreciate ID recommendations.  7/26: ID is recommending 2 weeks of IV antibiotics and for which patient will remain in the hospital, Followed by 2 weeks of p.o. antibiotics.  Remained stable. Had a small fall overnight while walking independently, apparently hurt left knee but imaging was negative for any acute abnormality.  No other injuries.  7/27: Patient remained stable.  Patient will need 2 weeks of vancomycin until 11/23/2021 followed by 2 weeks of p.o. linezolid per ID recommendations.  7/28: Patient remained stable, vancomycin till 11/23/2021 followed by 2 weeks of linezolid.  7/29: Remains stable, having some indigestion, refusing MiraLAX, we will try some Bentyl. Patient also saying that he was vomiting the whole night and nursing staff  were giving him medications.  No record of any of those incidents.  Patient only received 1 dose of Zofran around 9 PM.  No witnessed vomiting.  7/30: Abdominal x-ray was obtained with his persistent complaint of abdominal pain and distention, no noted abnormality noted except significant amount of stool and gas in colon and rectum.  Started on MiraLAX.  7/31: Patient continued to complain about indigestion and saying that he vomited multiple times, no witnessed vomit and he never received any thing to be seen by nursing staff. He was also not convinced about abdominal x-ray with concern of constipation. Keep saying that he wants to have scopes done while in the hospital, again explained that issue needs to be addressed as an outpatient as there is no emergent need. Hemoglobin improving and stable.  Labs are stable. Refused last night dose of vancomycin stating that his arm is hurting. Plan is to still complete vancomycin on 11/23/2021 and discharged on linezolid for another 2 weeks.     Assessment and Plan: * Cellulitis and abscess of left leg S/p of I&D by ED physician.  Patient does not have fever and chills.  Does not meets criteria for sepsis. Wound cultures with MRSA, sensitive to clindamycin, tetracycline and vancomycin. General surgery was also consulted due to worsening pain but there was no indication for another I&D. -Continue with wound care   MRSA bacteremia Initial blood cultures positive for MRSA most likely secondary to wound infection.  Also found to have Streptococcus species and coagulase-negative staph which was thought to be due to contaminant.  Repeat blood cultures on 11/10/2021 remain negative. Patient never met sepsis criteria. TTE and TEE was negative for endocarditis -2 weeks of IV  vancomycin followed by 2 weeks of p.o. antibiotics per ID, vancomycin will be continued until 11/23/2021 followed by 2 weeks of linezolid  Left-sided weakness Etiology is not clear.  MRI  of brain is negative for new stroke.  CTA negative for LVO.  Differential diagnosis include TIA versus malingering. -Aspirin, Lipitor   Stroke [facility]) Patient has history of stroke - On aspirin and Lipitor  Hypertensive urgency Blood pressure remained mildly elevated. Blood pressure 203/112 on admission.  This is due to medication noncompliance.  Patient is not taking blood pressure medications currently. -Increase the dose of amlodipine to 10 mg -Continue lisinopril -IV hydralazine as needed  HLD (hyperlipidemia) - Lipitor  Adjustment disorder with mixed disturbance of emotions and conduct Seen by psychiatrist-stable - Continue Celexa -Continue Seroquel 100 mg daily -Currently denies homicidal and homicidal ideations -No need for IVC or inpatient psych admission per psychiatry  Personality disorder ([place]) - See above  Depression with anxiety - See above  Suicidal ideation - Denies any suicidal ideation at this time  Cocaine abuse ([place]) - UDS done on 7/21 was negative. -Counseling was provided  Tobacco abuse - Nicotine patch  Gastroesophageal reflux disease with esophagitis - Continue PPI-increase the frequency to twice daily    Subjective: Patient was complaining of having hematemesis and bloody stools the whole night.  Discussed with nursing staff and they were not aware of any episodes. He was also complaining of abdominal pain and wants to see a GI. Patient was also refusing to be discharged tomorrow stating that he wants to complete 4 weeks of antibiotics even if they are p.o. in the hospital.  Physical Exam: Vitals:   11/21/21 1623 11/21/21 2006 11/22/21 0556 11/22/21 0831  BP: 132/64 131/72 116/61 (!) 156/79  Pulse: 69 70 67 66  Resp: '18 20 16 16  ' Temp: 98.1 F (36.7 C) (!) 97.5 F (36.4 C) 98.1 F (36.7 C) 97.8 F (36.6 C)  TempSrc:  Oral    SpO2: 100% 96% 97% 100%  Weight:      Height:       General.  Obese gentleman, in no acute  distress. Pulmonary.  Lungs clear bilaterally, normal respiratory effort. CV.  Regular rate and rhythm, no JVD, rub or murmur. Abdomen.  Soft, nontender, nondistended, BS positive. CNS.  Alert and oriented .  No focal neurologic deficit. Extremities.  No edema, no cyanosis, pulses intact and symmetrical. Psychiatry.  Judgment and insight appears normal.  Data Reviewed: Prior notes, labs and images reviewed  Family Communication: Called sister with no response.  Disposition: Status is: Inpatient Remains inpatient appropriate because: To complete IV antibiotics until tomorrow.   Planned Discharge Destination: Home  Time spent: 40 minutes  This record has been created using Systems analyst. Errors have been sought and corrected,but may not always be located. Such creation errors do not reflect on the standard of care.  Author: Lorella Nimrod, MD 11/22/2021 12:35 PM  For on call review [email or web address].

## 2021-11-22 NOTE — Assessment & Plan Note (Signed)
-   Continue PPI-increase the frequency to twice daily

## 2021-11-22 NOTE — Progress Notes (Signed)
Pharmacy Antibiotic Note  Samuel Molina is a 61 y.o. male w/ PMH of HTN, stroke and functional left leg weakness admitted on 11/08/2021 with bacteremia related to cellulitis.  Pharmacy has been consulted for vancomycin dosing. EDP drained leg abscess but no culture sent.  Assessment: Today, 11/22/2021 Day #15 vancomycin Renal: SCr stable (last check 7/31) Afebrile WBC and vital signs WNL 7/18 Bcx: 1 set with MSSA, MRSA and strep species.  2nd set with MSSA 7/19 Thigh: MRSA 7/20 Bcx NGTD 7/25 TEE: no evidence of endocarditis  Vancomycin level 7/26 on '1500mg'$  IV q12h Vancomycin trough = 12 mcg/mL '@09'$ :45  Vancomycin level 7/21 (Note - patient refused vancomycin peak at 0200 7/21 Vancomycin '1500mg'$  IV q12h @ 22:47 Vancomycin trough = 12 mcg/ml @ 8889  Plan:   Based on most recent trough, continue vancomycin 1500 mg IV every 12 hours Unable to calculate AUC as patient refused peak but he looks to be matching the calculated numbers below.   Goal AUC 400-600. Expected AUC: 443.2 SCr used: 0.85 mg/dL Ke: 0.092 h-1, T1/2 7.5 h Per 7/31 ID note, vanco will complete 2 weeks of IV vancomycin on 8/2 before possibly transitioning to linezolid for 2 weeks (although patient takes citalopram and quetiapine)  Height: '6\' 1"'$  (185.4 cm) Weight: 104.3 kg (230 lb) IBW/kg (Calculated) : 79.9  Temp (24hrs), Avg:97.9 F (36.6 C), Min:97.5 F (36.4 C), Max:98.1 F (36.7 C)  Recent Labs  Lab 11/16/21 0945 11/19/21 0702 11/21/21 0852  WBC  --  9.4 10.4  CREATININE 0.86 0.80 0.72  VANCOTROUGH 12*  --   --     Estimated Creatinine Clearance: 126.1 mL/min (by C-G formula based on SCr of 0.72 mg/dL).    Allergies  Allergen Reactions   Acetaminophen Itching, Nausea And Vomiting and Rash    Antimicrobials this admission: Ceftriaxone x1 07/18 Vancomycin 07/18 >>  Microbiology results: 7/18 Bcx: 1 set with MSSA, MRSA and strep species. 2nd set with MSSA 7/19 Thigh: MRSA 7/20 Bcx  NGTD  Thank you for allowing pharmacy to be a part of this patient's care.  Dara Hoyer, PharmD PGY-1 Pharmacy Resident 11/22/2021 9:22 AM

## 2021-11-22 NOTE — Progress Notes (Signed)
Care team notified of concerns brought to me by patient. Awaiting GI input. Orma Flaming, RN

## 2021-11-22 NOTE — Progress Notes (Signed)
ID Pt c/o vomiting with blood streaked fluid Some abdominal discomfort Other wise doing okay Walking  in corridor Has been kept NPO GI consulted  O/e awake and alert Patient Vitals for the past 24 hrs:  BP Temp Temp src Pulse Resp SpO2  11/22/21 0831 (!) 156/79 97.8 F (36.6 C) -- 66 16 100 %  11/22/21 0556 116/61 98.1 F (36.7 C) -- 67 16 97 %  11/21/21 2006 131/72 (!) 97.5 F (36.4 C) Oral 70 20 96 %   Chest b/l air entry Hs1s2 Abd soft Some distension Cns non focal  Labs    Latest Ref Rng & Units 11/22/2021   11:18 AM 11/21/2021    8:52 AM 11/19/2021    7:02 AM  CBC  WBC 4.0 - 10.5 K/uL 9.4  10.4  9.4   Hemoglobin 13.0 - 17.0 g/dL 13.7  13.7  12.9   Hematocrit 39.0 - 52.0 % 43.8  43.8  40.5   Platelets 150 - 400 K/uL 280  288  238        Latest Ref Rng & Units 11/22/2021   12:16 PM 11/21/2021    8:52 AM 11/19/2021    7:02 AM  CMP  Glucose 70 - 99 mg/dL 163  134    BUN 6 - 20 mg/dL 18  15    Creatinine 0.61 - 1.24 mg/dL 0.83  0.72  0.80   Sodium 135 - 145 mmol/L 138  139    Potassium 3.5 - 5.1 mmol/L 4.2  4.0    Chloride 98 - 111 mmol/L 103  101    CO2 22 - 32 mmol/L 27  27    Calcium 8.9 - 10.3 mg/dL 8.8  9.1      Impression/recommendation  MRSA bacteremia and MRSA soft tisse infection S/p I/D TEE neg On vancomycin  which will be 2 weeks tomorrow HE will need total of 4 weeks of antibitoic- the initial plan was to give him IV dalbavancin ( long acting antibiotic) as Op on 8/3 and 12/01/21 H/o SUD  and hence not a candidate for Home PICC and also being on SSRI linezolid can increase risk of serotonin syndrome Pt is agreeable to having Iv dalba at home thru peripheral line-   blood in stool and vomit Frequent vomiting Likely NSAID induced gastritis Ibuprofen has been stopped He is on aspirin still- hold?will let primary team decide   Discussed the management with the patient and hospitalist

## 2021-11-22 NOTE — Consult Note (Signed)
Lucilla Lame, MD [facility]  [address] [place], [place] [address] Phone: [phone] Fax : [phone]  Consultation  Referring Provider:     Dr. Reesa Chew Primary Care Physician:  Pcp, No Primary Gastroenterologist:  Dr. Marius Ditch         Reason for Consultation:     Dysphagia abdominal bloating and dyspepsia with a report of rectal bleeding  Date of Admission:  11/08/2021 Date of Consultation:  11/22/2021         HPI:   Samuel Molina is a 61 y.o. male who was admitted for cellulitis and is being evaluated and treated by infectious disease.  The patient reports that he has had dyspepsia with constant burping and he also reports that he had rectal bleeding.  The patient had a KUB that showed some retained stool in his colon.  He reports that his abdomen is more distended than it has been usually.  He also reports that he has had some rectal bleeding.  The patient has had a normal hemoglobin at 13.7 for the last 2 days.  He denies any hematemesis.  The patient does report the blood to be bright red in color and on the toilet paper and also in the toilet water.  There is no report of any clots being passed during his bowel movements.  He also states that he had a similar episode in the past with a upper endoscopy in 2019 not showing any cause for his symptoms at that time.  The patient has a history of reflux and reports that he is quite concerned about the rectal bleeding.  Past Medical History:  Diagnosis Date   Anxiety    Depression    GERD (gastroesophageal reflux disease)    Obesity    Stroke [facility])    Substance abuse ([place])     Past Surgical History:  Procedure Laterality Date   ESOPHAGOGASTRODUODENOSCOPY (EGD) WITH PROPOFOL N/A 09/20/2017   Procedure: ESOPHAGOGASTRODUODENOSCOPY (EGD) WITH PROPOFOL;  Surgeon: Lin Landsman, MD;  Location: [facility] ENDOSCOPY;  Service: Gastroenterology;  Laterality: N/A;   KNEE SURGERY Right    TEE WITHOUT CARDIOVERSION N/A 11/15/2021    Procedure: TRANSESOPHAGEAL ECHOCARDIOGRAM (TEE);  Surgeon: Minna Merritts, MD;  Location: [facility] ORS;  Service: Cardiovascular;  Laterality: N/A;    Prior to Admission medications   Medication Sig Start Date End Date Taking? Authorizing Provider  dalbavancin 1,000 mg in dextrose 5 % 500 mL Inject 1,000 mg into the vein once for 1 dose. Indication: MRSA thigh abscess and Bacteremia Give Dalbavancin '1500mg'$  IV x 1 on 8/3 and Dalbavancin '1000mg'$  IV x 1 on 8/10 Labs - Once on 11/29/2021:  CBC/D, CMP Fax lab results  promptly to Dr.Ravishankar [phone] Method of administration: IVBP Method of administration may be changed at the discretion of home infusion pharmacist based upon assessment of the patient and/or caregiver's ability to self-administer the medication ordered. 12/01/21 12/01/21 Yes Tsosie Billing, MD  amoxicillin-clavulanate (AUGMENTIN) 875-125 MG tablet Take 1 tablet by mouth every 12 (twelve) hours. Patient not taking: Reported on 12/17/2020 11/11/20   [name], MD  aspirin EC 81 MG tablet Take 81 mg by mouth daily. Swallow whole. Patient not taking: Reported on 11/08/2021    [provider]  atorvastatin (LIPITOR) 80 MG tablet Take 80 mg by mouth every evening. Patient not taking: Reported on 11/08/2021    [provider]  citalopram (CELEXA) 10 MG tablet Take 1 tablet (10 mg total) by mouth daily. Patient not  taking: Reported on 11/08/2021 11/12/20   [name], MD  dalbavancin 1,500 mg in dextrose 5 % 500 mL Inject 1,500 mg into the vein once for 1 dose. Indication: MRSA thigh abscess and Bacteremia Give Dalbavancin '1500mg'$  IV x 1 on 8/3 and Dalbavancin '1000mg'$  IV x 1 on 8/10 Labs - Once on 11/29/2021:  CBC/D, CMP Fax lab results  promptly to Dr.Ravishankar [phone] Method of administration: IVBP Method of administration may be changed at the discretion of home infusion pharmacist based upon assessment of the patient and/or caregiver's ability to  self-administer the medication ordered. 11/22/21 11/22/21  Tsosie Billing, MD  diclofenac Sodium (VOLTAREN) 1 % GEL Apply 4 g topically 4 (four) times daily. Patient not taking: Reported on 11/08/2021    [provider]  doxycycline (ADOXA) 100 MG tablet Take 1 tablet (100 mg total) by mouth 2 (two) times daily. Patient not taking: Reported on 11/08/2021 11/07/21   Nena Polio, MD  famotidine (PEPCID) 20 MG tablet Take 20 mg by mouth 2 (two) times daily. Patient not taking: Reported on 11/08/2021    [provider]  gabapentin (NEURONTIN) 300 MG capsule Take 1 capsule (300 mg total) by mouth 3 (three) times daily. 10/01/21 10/31/21  Menshew, Dannielle Karvonen, PA-C  gabapentin (NEURONTIN) 400 MG capsule Take 1 capsule (400 mg total) by mouth 3 (three) times daily. Patient not taking: Reported on 11/08/2021 11/11/20   [name], MD  lisinopril (ZESTRIL) 20 MG tablet Take 1 tablet (20 mg total) by mouth daily. Patient not taking: Reported on 11/08/2021 11/12/20   [name], MD  naproxen (NAPROSYN) 500 MG tablet Take 1 tablet (500 mg total) by mouth 2 (two) times daily with a meal. Patient not taking: Reported on 11/08/2021 10/01/21   Carrie Mew, MD  QUEtiapine (SEROQUEL) 300 MG tablet Take 1 tablet (300 mg total) by mouth at bedtime. Patient not taking: Reported on 11/08/2021 11/11/20   [name], MD  omeprazole (PRILOSEC OTC) 20 MG tablet Take 2 tablets (40 mg total) by mouth 2 (two) times daily before a meal. 09/19/17 01/29/19  Lin Landsman, MD    Family History  Problem Relation Age of Onset   Vasculitis Mother      Social History   Tobacco Use   Smoking status: Every Day    Packs/day: 0.50    Types: Cigarettes   Smokeless tobacco: Never  Vaping Use   Vaping Use: Never used  Substance Use Topics   Alcohol use: Not Currently   Drug use: Yes    Types: Cocaine    Comment: used two days ago    Allergies as of 11/08/2021 - Review Complete  11/08/2021  Allergen Reaction Noted   Acetaminophen Itching, Nausea And Vomiting, and Rash 11/04/2015    Review of Systems:    All systems reviewed and negative except where noted in HPI.   Physical Exam:  Vital signs in last 24 hours: Temp:  [97.5 F (36.4 C)-98.7 F (37.1 C)] 98.7 F (37.1 C) (08/01 1746) Pulse Rate:  [66-71] 71 (08/01 1746) Resp:  [16-20] 16 (08/01 1746) BP: (116-156)/(61-79) 151/79 (08/01 1746) SpO2:  [96 %-100 %] 99 % (08/01 1746) Last BM Date : 11/21/21 General:   Pleasant, cooperative in NAD Head:  Normocephalic and atraumatic. Eyes:   No icterus.   Conjunctiva pink. PERRLA. Ears:  Normal auditory acuity. Neck:  Supple; no masses or thyroidomegaly Rectal:  Not performed. Msk:  Symmetrical without gross deformities.  Neurologic:  Alert and oriented x3;  grossly normal neurologically. Skin:  Intact without significant lesions or rashes. Psych:  Alert and cooperative. Normal affect.  LAB RESULTS: Recent Labs    11/21/21 0852 11/22/21 1118  WBC 10.4 9.4  HGB 13.7 13.7  HCT 43.8 43.8  PLT 288 280   BMET Recent Labs    11/21/21 0852 11/22/21 1216  NA 139 138  K 4.0 4.2  CL 101 103  CO2 27 27  GLUCOSE 134* 163*  BUN 15 18  CREATININE 0.72 0.83  CALCIUM 9.1 8.8*   LFT No results for input(s): "PROT", "ALBUMIN", "AST", "ALT", "ALKPHOS", "BILITOT", "BILIDIR", "IBILI" in the last 72 hours. PT/INR No results for input(s): "LABPROT", "INR" in the last 72 hours.  STUDIES: No results found.    Impression / Plan:   Assessment: Principal Problem:   Cellulitis and abscess of left leg Active Problems:   Gastroesophageal reflux disease with esophagitis   Cocaine abuse ([facility])   Personality disorder ([facility])   Stroke ([facility])   HLD (hyperlipidemia)   Tobacco abuse   Left-sided weakness   Adjustment disorder with mixed disturbance of emotions and conduct   Depression with anxiety   Hypertensive urgency   Suicidal ideation   MRSA  bacteremia   Samuel Molina is a 61 y.o. y/o male with with a report of dysphagia with dyspepsia and burping and abdominal distention.  The patient also reports that he has had some rectal bleeding.  The patient had an upper endoscopy in 2019 without a cause for his symptoms at that time being found.  He is now concerned about the rectal bleeding bloating and burping.  Plan:  This patient will be set up for an EGD and colonoscopy by Dr. Vicente Males for tomorrow.  He has been explained that the excessive burping is likely caused by aerophagia and that the stomach does not make air so if he continues to burp up Barrett's because he is swallowing it someway.  He denies chewing gum but is drinking with a straw.  It also may be related to his dysphagia causing him to swallow more frequently with accompanied air.  The patient will be given a prep for today and a clear liquid diet and will be kept n.p.o. after midnight for an EGD and colonoscopy.  The patient has been explained the plan and agrees with it.  Thank you for involving me in the care of this patient.      LOS: 89 days   Lucilla Lame, MD, [facility] 11/22/2021, 7:09 PM,  Pager [phone] 7am-5pm  Check AMION for 5pm -7am coverage and on weekends   Note: This dictation was prepared with Dragon dictation along with smaller phrase technology. Any transcriptional errors that result from this process are unintentional.

## 2021-11-22 NOTE — Progress Notes (Addendum)
Patient stated that the other night he and yesterday; he was vomiting blood and also having bloody stool. His stomach is firm and distended as well as tender to touch. I made the MD aware today.

## 2021-11-22 NOTE — Progress Notes (Addendum)
PHARMACY CONSULT NOTE FOR:  OUTPATIENT  PARENTERAL ANTIBIOTIC THERAPY (OPAT)  Indication: MRSA thigh abscess and bacteremia Regimen: Give Dalbavancin '1500mg'$  IV x 1 on 8/3 and repeat '1000mg'$  IV x 1 on 8/10 End date: as above  IV antibiotic discharge orders are pended. To discharging provider:  please sign these orders via discharge navigator,  Select New Orders & click on the button choice - Manage This Unsigned Work.     Thank you for allowing pharmacy to be a part of this patient's care.  Doreene Eland, PharmD, BCPS, BCIDP Work Cell: [phone] 11/22/2021 2:53 PM

## 2021-11-23 ENCOUNTER — Encounter: Payer: Self-pay | Admitting: Internal Medicine

## 2021-11-23 ENCOUNTER — Inpatient Hospital Stay: Payer: 59 | Admitting: Anesthesiology

## 2021-11-23 ENCOUNTER — Encounter
Admission: EM | Disposition: A | Discharge status: Home Health | Payer: Self-pay | Source: Home / Self Care | Attending: Internal Medicine

## 2021-11-23 DIAGNOSIS — L02416 Cutaneous abscess of left lower limb: Secondary | ICD-10-CM | POA: Diagnosis not present

## 2021-11-23 DIAGNOSIS — D123 Benign neoplasm of transverse colon: Secondary | ICD-10-CM

## 2021-11-23 DIAGNOSIS — R7881 Bacteremia: Secondary | ICD-10-CM | POA: Diagnosis not present

## 2021-11-23 DIAGNOSIS — R1319 Other dysphagia: Secondary | ICD-10-CM

## 2021-11-23 DIAGNOSIS — E669 Obesity, unspecified: Secondary | ICD-10-CM

## 2021-11-23 DIAGNOSIS — L03116 Cellulitis of left lower limb: Secondary | ICD-10-CM | POA: Diagnosis not present

## 2021-11-23 DIAGNOSIS — B9562 Methicillin resistant Staphylococcus aureus infection as the cause of diseases classified elsewhere: Secondary | ICD-10-CM | POA: Diagnosis not present

## 2021-11-23 DIAGNOSIS — K625 Hemorrhage of anus and rectum: Secondary | ICD-10-CM

## 2021-11-23 HISTORY — PX: COLONOSCOPY WITH PROPOFOL: SHX5780

## 2021-11-23 HISTORY — PX: ESOPHAGOGASTRODUODENOSCOPY: SHX5428

## 2021-11-23 SURGERY — COLONOSCOPY WITH PROPOFOL
Anesthesia: General

## 2021-11-23 MED ORDER — PROPOFOL 10 MG/ML IV BOLUS
INTRAVENOUS | Status: DC | PRN
  Administered 2021-11-23: 30 mg via INTRAVENOUS
  Administered 2021-11-23: 50 mg via INTRAVENOUS

## 2021-11-23 MED ORDER — LIDOCAINE HCL (CARDIAC) PF 100 MG/5ML IV SOSY
PREFILLED_SYRINGE | INTRAVENOUS | Status: DC | PRN
  Administered 2021-11-23: 50 mg via INTRAVENOUS

## 2021-11-23 MED ORDER — PROPOFOL 500 MG/50ML IV EMUL
INTRAVENOUS | Status: DC | PRN
  Administered 2021-11-23: 150 ug/kg/min via INTRAVENOUS

## 2021-11-23 NOTE — Assessment & Plan Note (Signed)
  S/p of I&D by ED physician.  Patient does not have fever and chills.  Does not meets criteria for sepsis and sepsis ruled out. Wound cultures with MRSA, sensitive to clindamycin, tetracycline and vancomycin.  General surgery was also consulted due to worsening pain but there was no indication for another I&D. -Continue with wound care, even on discharge

## 2021-11-23 NOTE — H&P (Signed)
[name], MD [address] [place], [place], [place], [address] [address], [place], [place], [place], [address] Phone: [phone]  Fax: [phone]  Primary Care Physician:  Pcp, No   Pre-Procedure History & Physical: HPI:  Samuel Molina is a 61 y.o. male is here for an endoscopy and colonoscopy    Past Medical History:  Diagnosis Date   Anxiety    Depression    GERD (gastroesophageal reflux disease)    Obesity    Stroke [facility])    Substance abuse ([place])     Past Surgical History:  Procedure Laterality Date   ESOPHAGOGASTRODUODENOSCOPY (EGD) WITH PROPOFOL N/A 09/20/2017   Procedure: ESOPHAGOGASTRODUODENOSCOPY (EGD) WITH PROPOFOL;  Surgeon: Lin Landsman, MD;  Location: [facility] ENDOSCOPY;  Service: Gastroenterology;  Laterality: N/A;   KNEE SURGERY Right    TEE WITHOUT CARDIOVERSION N/A 11/15/2021   Procedure: TRANSESOPHAGEAL ECHOCARDIOGRAM (TEE);  Surgeon: Minna Merritts, MD;  Location: [facility] ORS;  Service: Cardiovascular;  Laterality: N/A;    Prior to Admission medications   Medication Sig Start Date End Date Taking? Authorizing Provider  dalbavancin 1,000 mg in dextrose 5 % 500 mL Inject 1,000 mg into the vein once for 1 dose. Indication: MRSA thigh abscess and Bacteremia Give Dalbavancin '1500mg'$  IV x 1 on 8/3 and Dalbavancin '1000mg'$  IV x 1 on 8/10 Labs - Once on 11/29/2021:  CBC/D, CMP Fax lab results  promptly to Dr.Ravishankar [phone] Method of administration: IVBP Method of administration may be changed at the discretion of home infusion pharmacist based upon assessment of the patient and/or caregiver's ability to self-administer the medication ordered. 12/01/21 12/01/21 Yes Tsosie Billing, MD  amoxicillin-clavulanate (AUGMENTIN) 875-125 MG tablet Take 1 tablet by mouth every 12 (twelve) hours. Patient not taking: Reported on 12/17/2020 11/11/20   [name], MD  aspirin EC 81 MG tablet Take 81 mg by mouth daily. Swallow whole. Patient  not taking: Reported on 11/08/2021    [provider]  atorvastatin (LIPITOR) 80 MG tablet Take 80 mg by mouth every evening. Patient not taking: Reported on 11/08/2021    [provider]  citalopram (CELEXA) 10 MG tablet Take 1 tablet (10 mg total) by mouth daily. Patient not taking: Reported on 11/08/2021 11/12/20   [name], MD  diclofenac Sodium (VOLTAREN) 1 % GEL Apply 4 g topically 4 (four) times daily. Patient not taking: Reported on 11/08/2021    [provider]  doxycycline (ADOXA) 100 MG tablet Take 1 tablet (100 mg total) by mouth 2 (two) times daily. Patient not taking: Reported on 11/08/2021 11/07/21   Nena Polio, MD  famotidine (PEPCID) 20 MG tablet Take 20 mg by mouth 2 (two) times daily. Patient not taking: Reported on 11/08/2021    [provider]  gabapentin (NEURONTIN) 300 MG capsule Take 1 capsule (300 mg total) by mouth 3 (three) times daily. 10/01/21 10/31/21  Menshew, Dannielle Karvonen, PA-C  gabapentin (NEURONTIN) 400 MG capsule Take 1 capsule (400 mg total) by mouth 3 (three) times daily. Patient not taking: Reported on 11/08/2021 11/11/20   [name], MD  lisinopril (ZESTRIL) 20 MG tablet Take 1 tablet (20 mg total) by mouth daily. Patient not taking: Reported on 11/08/2021 11/12/20   [name], MD  naproxen (NAPROSYN) 500 MG tablet Take 1 tablet (500 mg total) by mouth 2 (two) times daily with a meal. Patient not taking: Reported on 11/08/2021 10/01/21   Carrie Mew, MD  QUEtiapine (SEROQUEL) 300  MG tablet Take 1 tablet (300 mg total) by mouth at bedtime. Patient not taking: Reported on 11/08/2021 11/11/20   [name], MD  omeprazole (PRILOSEC OTC) 20 MG tablet Take 2 tablets (40 mg total) by mouth 2 (two) times daily before a meal. 09/19/17 01/29/19  Lin Landsman, MD    Allergies as of 11/08/2021 - Review Complete 11/08/2021  Allergen Reaction Noted   Acetaminophen Itching, Nausea And Vomiting, and Rash  11/04/2015    Family History  Problem Relation Age of Onset   Vasculitis Mother     Social History   Socioeconomic History   Marital status: Single    Spouse name: Not on file   Number of children: Not on file   Years of education: Not on file   Highest education level: Not on file  Occupational History   Not on file  Tobacco Use   Smoking status: Every Day    Packs/day: 0.50    Types: Cigarettes   Smokeless tobacco: Never  Vaping Use   Vaping Use: Never used  Substance and Sexual Activity   Alcohol use: Not Currently   Drug use: Yes    Types: Cocaine    Comment: used two days ago   Sexual activity: Not Currently  Other Topics Concern   Not on file  Social History Narrative   Not on file   Social Determinants of Health   Financial Resource Strain: Not on file  Food Insecurity: Not on file  Transportation Needs: Not on file  Physical Activity: Not on file  Stress: Not on file  Social Connections: Not on file  Intimate Partner Violence: Not on file    Review of Systems: See HPI, otherwise negative ROS  Physical Exam: BP (!) 141/77 (BP Location: Right Arm)   Pulse 61   Temp (!) 97 F (36.1 C) (Temporal)   Resp 20   Ht '6\' 1"'$  (1.854 m)   Wt 104.3 kg   SpO2 100%   BMI 30.34 kg/m  General:   Alert,  pleasant and cooperative in NAD Head:  Normocephalic and atraumatic. Neck:  Supple; no masses or thyromegaly. Lungs:  Clear throughout to auscultation, normal respiratory effort.    Heart:  +S1, +S2, Regular rate and rhythm, No edema. Abdomen:  Soft, nontender and nondistended. Normal bowel sounds, without guarding, and without rebound.   Neurologic:  Alert and  oriented x4;  grossly normal neurologically.  Impression/Plan: Samuel Molina is here for an endoscopy and colonoscopy  to be performed for  evaluation of dysphagia and rectal bleeding     Risks, benefits, limitations, and alternatives regarding endoscopy have been reviewed with the patient.   Questions have been answered.  All parties agreeable.   [name], MD  11/23/2021, 1:43 PM

## 2021-11-23 NOTE — Assessment & Plan Note (Signed)
-   Continue PPI-increase the frequency to twice daily

## 2021-11-23 NOTE — Assessment & Plan Note (Signed)
-   UDS done on 7/21 was negative. -Counseling was provided

## 2021-11-23 NOTE — Anesthesia Procedure Notes (Signed)
Procedure Name: MAC Date/Time: 11/23/2021 1:57 PM  Performed by: Jerrye Noble, CRNAPre-anesthesia Checklist: Patient identified, Emergency Drugs available, Suction available and Patient being monitored Patient Re-evaluated:Patient Re-evaluated prior to induction Oxygen Delivery Method: Nasal cannula

## 2021-11-23 NOTE — Assessment & Plan Note (Signed)
Meets criteria BMI greater than 30 

## 2021-11-23 NOTE — Progress Notes (Signed)
ID Pt had endoscopy and colonoscopy today Je is feeling fine and is planning to eat  O/e looks well Patient Vitals for the past 24 hrs:  BP Temp Temp src Pulse Resp SpO2  11/23/21 2036 (!) 127/55 -- -- 81 -- --  11/23/21 2034 -- (!) 97.4 F (36.3 C) -- 80 17 100 %  11/23/21 1714 (!) 152/69 98.8 F (37.1 C) Oral 79 -- 100 %  11/23/21 1435 118/81 -- -- -- -- --  11/23/21 1418 110/71 (!) 97 F (36.1 C) Temporal -- -- --  11/23/21 1336 -- (!) 97 F (36.1 C) Temporal 61 20 100 %  11/23/21 0908 (!) 141/77 98 F (36.7 C) Oral 92 18 99 %  11/23/21 0523 124/68 98 F (36.7 C) -- 61 18 100 %   Chest b/l air entry Hs1s2 Abd soft Cns non focal Left thigh abscess/wound healing well  Labs    Latest Ref Rng & Units 11/22/2021   11:18 AM 11/21/2021    8:52 AM 11/19/2021    7:02 AM  CBC  WBC 4.0 - 10.5 K/uL 9.4  10.4  9.4   Hemoglobin 13.0 - 17.0 g/dL 13.7  13.7  12.9   Hematocrit 39.0 - 52.0 % 43.8  43.8  40.5   Platelets 150 - 400 K/uL 280  288  238        Latest Ref Rng & Units 11/22/2021   12:16 PM 11/21/2021    8:52 AM 11/19/2021    7:02 AM  CMP  Glucose 70 - 99 mg/dL 163  134    BUN 6 - 20 mg/dL 18  15    Creatinine 0.61 - 1.24 mg/dL 0.83  0.72  0.80   Sodium 135 - 145 mmol/L 138  139    Potassium 3.5 - 5.1 mmol/L 4.2  4.0    Chloride 98 - 111 mmol/L 103  101    CO2 22 - 32 mmol/L 27  27    Calcium 8.9 - 10.3 mg/dL 8.8  9.1      Impression/recommendation  MRSA bacteremia and MRSA soft tisse infection S/p I/D TEE neg On vancomycin  completes 14 days today HE will need total of 4 weeks of antibitoic- he will get   IV dalbavancin ( long acting antibiotic) at home  on 8/3 and 12/01/21 H/o SUD  and hence not a candidate for Home PICC and also being on SSRI ,linezolid can increase risk of serotonin syndrome   blood in stool and vomit Frequent vomiting Endoscopy showed esophageal dysmotility Colonoscopy- few polyps removed   Discussed the management with the patient and  hospitalist ID will follow him as OP

## 2021-11-23 NOTE — Assessment & Plan Note (Signed)
Initial blood cultures positive for MRSA most likely secondary to wound infection.  Also found to have Streptococcus species and coagulase-negative staph which was thought to be due to contaminant.  Repeat blood cultures on 11/10/2021 remain negative as well as TEE and TTE.  Patient completed 2 weeks of IV vancomycin on 8/2 with plans for outpatient dalbavancin on 8/3 and 8/10 at home.

## 2021-11-23 NOTE — Progress Notes (Signed)
[facility] Progress Note  Patient: Samuel Molina    VOH:[id number]  DOA: 11/08/2021    Date of Service: the patient was seen and examined on 11/23/2021  Brief hospital course: 68M, hx of HTN, HLD, TIA, GERD, multiple psych issue (depression, anxiety, personal disorder, malingering, adjustment disorder), tobacco abuse, cocaine abuse, Bell's palsy, presented with multiple complaints including left-sided weakness numbness, suicidal ideation and left leg wound. Patient seen in the ED underwent lab work essentially unremarkable chest x-ray no acute finding CT head dense right MCA bifurcation without hemorrhage MRI brain negative for acute stroke CTA head and neck negative for LVO, psychiatry consulted patient was admitted.  Wound and blood cultures with MRSA.  Blood cultures also grew coagulase-negative staph and streptococcal species which is thought to be due to contaminant per ID.  Repeat blood cultures, 2D echo and TEE unremarkable and patient completed 2 weeks of IV antibiotics as of 8/2.  Patient had reports of some bloody stool as well as indigestion and seen by GI and taken for EGD and colonoscopy on 8/2.  EGD noted some mild decrease in esophageal motility along with single colon polyp which was removed and biopsies pending.  Diverticulosis also noted.  Plan is for patient to be discharged home on 8/3 with OPAT dalbavancin on 8/3 and 8/10.  Assessment and Plan: Assessment and Plan: * Cellulitis and abscess of left leg S/p of I&D by ED physician.  Patient does not have fever and chills.  Does not meets criteria for sepsis and sepsis ruled out. Wound cultures with MRSA, sensitive to clindamycin, tetracycline and vancomycin.  General surgery was also consulted due to worsening pain but there was no indication for another I&D. -Continue with wound care   MRSA bacteremia Initial blood cultures positive for MRSA most likely secondary to wound infection.  Also found to have Streptococcus  species and coagulase-negative staph which was thought to be due to contaminant.  Repeat blood cultures on 11/10/2021 remain negative as well as TEE and TTE.  Patient completed 2 weeks of IV vancomycin on 8/2 with plans for outpatient dalbavancin on 8/3 and 8/10 at home.  Left-sided weakness Etiology is not clear.  MRI of brain is negative for new stroke.  CTA negative for LVO.  Differential diagnosis include TIA versus malingering. -Aspirin, Lipitor   Stroke [facility]) Patient has history of stroke - On aspirin and Lipitor  Hypertensive urgency Blood pressure remained mildly elevated. Blood pressure 203/112 on admission.  This is due to medication noncompliance.  Patient is not taking blood pressure medications currently. -Increase the dose of amlodipine to 10 mg -Continue lisinopril -IV hydralazine as needed  HLD (hyperlipidemia) - Lipitor  Adjustment disorder with mixed disturbance of emotions and conduct Seen by psychiatrist-stable - Continue Celexa -Continue Seroquel 100 mg daily -Currently denies homicidal and homicidal ideations -No need for IVC or inpatient psych admission per psychiatry  Personality disorder ([place]) - See above  Depression with anxiety - See above  Suicidal ideation - Denies any suicidal ideation at this time  Cocaine abuse ([place]) - UDS done on 7/21 was negative. -Counseling was provided  Tobacco abuse - Nicotine patch  Gastroesophageal reflux disease with esophagitis - Continue PPI-increase the frequency to twice daily  Obesity (BMI 30-39.9) Meets criteria BMI greater than 30  Rectal bleeding Status post colonoscopy.  Felt to be secondary to internal hemorrhoids  Dyspepsia Noted decreased esophageal motility.  Biopsies taken and patient with outpatient follow-up       Body mass index  is 30.34 kg/m.        Consultants: Infectious disease Gastroenterology Psychiatry Neurology General surgery Wound  care Cardiology  Procedures: TEE 2D echo Colonoscopy EGD Placement of PICC line  Antimicrobials: IV vancomycin x2 weeks  Code Status: Full code   Subjective: Patient doing well, no complaints  Objective: Vital signs were reviewed and unremarkable. Vitals:   11/23/21 1435 11/23/21 1714  BP: 118/81 (!) 152/69  Pulse:  79  Resp:    Temp:  98.8 F (37.1 C)  SpO2:  100%    Intake/Output Summary (Last 24 hours) at 11/23/2021 1725 Last data filed at 11/23/2021 0544 Gross per 24 hour  Intake 4388.3 ml  Output 8 ml  Net 4380.3 ml   Filed Weights   11/08/21 0532 11/15/21 1251  Weight: 102 kg 104.3 kg   Body mass index is 30.34 kg/m.  Exam:  General: Alert and oriented x3, no acute distress HEENT: Normocephalic, atraumatic, mucous membranes are moist Cardiovascular: Regular rate and rhythm, S1-S2 Respiratory: Clear to auscultation bilaterally Abdomen: Soft, obese, nontender, positive bowel sounds Musculoskeletal: Clubbing or cyanosis or edema Psychiatry: Appropriate, no evidence of psychoses  Data Reviewed: No labs today  Disposition:  Status is: Inpatient Remains inpatient appropriate because: Family available tomorrow to let patient into house    Anticipated discharge date: 8/3 Family Communication: Left message for family DVT Prophylaxis:   Lovenox    Author: Annita Brod ,MD 11/23/2021 5:25 PM  To reach On-call, see care teams to locate the attending and reach out via [email or web address]. Between 7PM-7AM, please contact night-coverage If you still have difficulty reaching the attending provider, please page the [facility] (Director on Call) for [facility] on amion for assistance.

## 2021-11-23 NOTE — Progress Notes (Signed)
11/21/2021 at 1930:  No episodes of nausea or vomiting reported by day shift RN. Pt reports no episodes of nausea or vomiting upon assessment. This RN will continue to monitor.

## 2021-11-23 NOTE — Progress Notes (Signed)
11/22/2021 at 1949:  Bowel prep (NuLYTELY) administered to pt by this RN. Pt instructed on how to properly take bowel prep. Pt requested two mango [place] ice pops. Ice pops given to pt by NT. Pt states, "I am so hungry, I hate this bowel prep and that I can't eat." This RN educates pt on clear liquid diet and informs pt that he will be NPO at midnight in preparation for upcoming GI procedure. Pt states, "Good. I'll be happy when I can eat again." Pt does not report any episodes of nausea/vomiting. This RN will continue to educate pt and monitor for signs/symptoms of GI distress.

## 2021-11-23 NOTE — Assessment & Plan Note (Signed)
Etiology is not clear.  MRI of brain is negative for new stroke.  CTA negative for LVO.  Differential diagnosis include TIA versus malingering. -Aspirin, Lipitor

## 2021-11-23 NOTE — Assessment & Plan Note (Signed)
Noted decreased esophageal motility.  Biopsies taken and patient with outpatient follow-up

## 2021-11-23 NOTE — Op Note (Signed)
[facility] Gastroenterology Patient Name: Samuel Molina Procedure Date: 11/23/2021 1:42 PM MRN: [id number] Account #: [id number] Date of Birth: Feb 28, 1961 Admit Type: Inpatient Age: 61 Room: [facility] ENDO ROOM 3 Gender: Male Note Status: Finalized Instrument Name: Upper Endoscope [id number] Procedure:             Upper GI endoscopy Indications:           Dysphagia Providers:             [name] MD, MD Medicines:             Monitored Anesthesia Care Complications:         No immediate complications. Procedure:             Pre-Anesthesia Assessment:                        - Prior to the procedure, a History and Physical was                         performed, and patient medications, allergies and                         sensitivities were reviewed. The patient's tolerance                         of previous anesthesia was reviewed.                        - The risks and benefits of the procedure and the                         sedation options and risks were discussed with the                         patient. All questions were answered and informed                         consent was obtained.                        - ASA Grade Assessment: II - A patient with mild                         systemic disease.                        After obtaining informed consent, the endoscope was                         passed under direct vision. Throughout the procedure,                         the patient's blood pressure, pulse, and oxygen                         saturations were monitored continuously. The Endoscope                         was introduced through the mouth, and advanced to the  third part of duodenum. The upper GI endoscopy was                         accomplished with ease. The patient tolerated the                         procedure well. Findings:      The examined duodenum was normal.      The stomach was normal.      The cardia  and gastric fundus were normal on retroflexion.      Abnormal motility was noted in the esophagus. The cricopharyngeus was       abnormal. There is a decrease in motility of the esophageal body. The       distal esophagus/lower esophageal sphincter is open. Normal peristalsis       not noted. Biopsies were taken with a cold forceps for histology. Impression:            - Normal examined duodenum.                        - Normal stomach.                        - Abnormal esophageal motility. Biopsied. Recommendation:        - Await pathology results.                        - Perform a colonoscopy today. Procedure Code(s):     --- Professional ---                        [phone], Esophagogastroduodenoscopy, flexible,                         transoral; with biopsy, single or multiple Diagnosis Code(s):     --- Professional ---                        K22.4, Dyskinesia of esophagus                        R13.10, Dysphagia, unspecified CPT copyright 2019 American Medical Association. All rights reserved. The codes documented in this report are preliminary and upon coder review may  be revised to meet current compliance requirements. [name], MD [name] MD, MD 11/23/2021 2:03:13 PM This report has been signed electronically. Number of Addenda: 0 Note Initiated On: 11/23/2021 1:42 PM Estimated Blood Loss:  Estimated blood loss: none.      [facility]

## 2021-11-23 NOTE — Progress Notes (Signed)
11/22/2021 at 1930:  Day shift RN reported that pt had episodes of pink-tinged vomit during shift. Day shift RN reported that pt will receive bowel prep and diet change to clear liquid for upcoming GI procedure. Upon assessment, pt is resting. Pt does not report feeling nauseous to this RN when inquired. Pt is resting and call bell within reach. This RN will continue to monitor.

## 2021-11-23 NOTE — Op Note (Signed)
[facility] Gastroenterology Patient Name: Samuel Molina Procedure Date: 11/23/2021 1:42 PM MRN: [id number] Account #: [id number] Date of Birth: February 17, 1961 Admit Type: Inpatient Age: 61 Room: [facility] ENDO ROOM 3 Gender: Male Note Status: Finalized Instrument Name: Jasper Riling [id number] Procedure:             Colonoscopy Indications:           Rectal bleeding Providers:             [name] MD, MD Medicines:             Monitored Anesthesia Care Complications:         No immediate complications. Procedure:             Pre-Anesthesia Assessment:                        - Prior to the procedure, a History and Physical was                         performed, and patient medications, allergies and                         sensitivities were reviewed. The patient's tolerance                         of previous anesthesia was reviewed.                        - The risks and benefits of the procedure and the                         sedation options and risks were discussed with the                         patient. All questions were answered and informed                         consent was obtained.                        - ASA Grade Assessment: II - A patient with mild                         systemic disease.                        After obtaining informed consent, the colonoscope was                         passed under direct vision. Throughout the procedure,                         the patient's blood pressure, pulse, and oxygen                         saturations were monitored continuously. The                         Colonoscope was introduced through the anus and  advanced to the the cecum, identified by the                         appendiceal orifice. The colonoscopy was performed                         with ease. The patient tolerated the procedure well.                         The quality of the bowel preparation was poor. Findings:       The perianal and digital rectal examinations were normal.      A 8 mm polyp was found in the transverse colon. The polyp was sessile.       The polyp was removed with a cold snare. Resection and retrieval were       complete.      Multiple small and large-mouthed diverticula were found in the sigmoid       colon.      Non-bleeding internal hemorrhoids were found during retroflexion. The       hemorrhoids were large and Grade I (internal hemorrhoids that do not       prolapse). Impression:            - Preparation of the colon was poor.                        - One 8 mm polyp in the transverse colon, removed with                         a cold snare. Resected and retrieved.                        - Diverticulosis in the sigmoid colon.                        - Non-bleeding internal hemorrhoids. Recommendation:        - Return patient to hospital ward for ongoing care.                        - Advance diet as tolerated.                        - Continue present medications.                        - Await pathology results.                        - Repeat colonoscopy in 4 weeks because the bowel                         preparation was suboptimal.                        - Rectal bleeding likely from internal hemorroids: my                         office will schedule him for banding at the office.  GI will sign off Procedure Code(s):     --- Professional ---                        [id number], Colonoscopy, flexible; with removal of                         tumor(s), polyp(s), or other lesion(s) by snare                         technique Diagnosis Code(s):     --- Professional ---                        K64.0, First degree hemorrhoids                        K63.5, Polyp of colon                        K62.5, Hemorrhage of anus and rectum                        K57.30, Diverticulosis of large intestine without                         perforation or abscess without  bleeding CPT copyright 2019 [employer]. All rights reserved. The codes documented in this report are preliminary and upon coder review may  be revised to meet current compliance requirements. [name], MD [name] MD, MD 11/23/2021 2:17:13 PM This report has been signed electronically. Number of Addenda: 0 Note Initiated On: 11/23/2021 1:42 PM Scope Withdrawal Time: 0 hours 6 minutes 18 seconds  Total Procedure Duration: 0 hours 9 minutes 39 seconds  Estimated Blood Loss:  Estimated blood loss: none.      [facility]

## 2021-11-23 NOTE — Progress Notes (Signed)
11/22/2021 at 2226:  New IV placed by IV team. Fluids restarted by this RN. Will continue to monitor for signs/symptoms of infiltration.

## 2021-11-23 NOTE — Transfer of Care (Signed)
Immediate Anesthesia Transfer of Care Note  Patient: Samuel Molina  Procedure(s) Performed: COLONOSCOPY WITH PROPOFOL ESOPHAGOGASTRODUODENOSCOPY (EGD)  Patient Location: PACU  Anesthesia Type:General  Level of Consciousness: awake, alert  and oriented  Airway & Oxygen Therapy: Patient Spontanous Breathing  Post-op Assessment: Report given to RN and Post -op Vital signs reviewed and stable  Post vital signs: Reviewed and stable  Last Vitals:  Vitals Value Taken Time  BP 110/71 11/23/21 1418  Temp 36.1 C 11/23/21 1418  Pulse 75 11/23/21 1419  Resp 16 11/23/21 1419  SpO2 100 % 11/23/21 1419  Vitals shown include unvalidated device data.  Last Pain:  Vitals:   11/23/21 1418  TempSrc: Temporal  PainSc:          Complications: No notable events documented.

## 2021-11-23 NOTE — Progress Notes (Addendum)
Pharmacy Antibiotic Note  Samuel Molina is a 61 y.o. male w/ PMH of HTN, stroke and functional left leg weakness admitted on 11/08/2021 with bacteremia related to cellulitis.  Pharmacy has been consulted for vancomycin dosing. EDP drained leg abscess but no culture sent.  Assessment: Today, 11/23/2021 Day #16 vancomycin Patient is afebrile. Vital signs and WBCs are WNL.  Plan: One dose of vanco 1.5 mg IV today before transitioning to dalbavancin tomorrow, as detailed below OPAT dalbavancin 1500 mg IV on 08/03 thru peripheral line OPAT dalbavancin second dose of 1000 mg IV on 08/10   Height: '6\' 1"'$  (185.4 cm) Weight: 104.3 kg (230 lb) IBW/kg (Calculated) : 79.9  Temp (24hrs), Avg:98.2 F (36.8 C), Min:98 F (36.7 C), Max:98.7 F (37.1 C)  Recent Labs  Lab 11/19/21 0702 11/21/21 0852 11/22/21 1118 11/22/21 1216  WBC 9.4 10.4 9.4  --   CREATININE 0.80 0.72  --  0.83    Estimated Creatinine Clearance: 121.6 mL/min (by C-G formula based on SCr of 0.83 mg/dL).    Allergies  Allergen Reactions   Acetaminophen Itching, Nausea And Vomiting and Rash    Antimicrobials this admission: Ceftriaxone x1 07/18 Vancomycin 1.5 g IV q12H 07/18 >>  Microbiology results: 7/18 Bcx: 1 set with MSSA, MRSA and strep species. 2nd set with MSSA 7/19 Thigh: MRSA 7/20 Bcx NGTD  Thank you for allowing pharmacy to be a part of this patient's care.  Dara Hoyer, PharmD PGY-1 Pharmacy Resident 11/23/2021 12:19 PM

## 2021-11-23 NOTE — Progress Notes (Signed)
11/21/2021 at 0630:  No episodes of vomiting reported by pt. Pt mentation is A&Ox4. Pt calls appropriately. No PRN anti-nausea meds requested by pt or given by this RN. RN will continue to monitor.

## 2021-11-23 NOTE — Assessment & Plan Note (Signed)
See above

## 2021-11-23 NOTE — Assessment & Plan Note (Signed)
Status post colonoscopy.  Felt to be secondary to internal hemorrhoids

## 2021-11-23 NOTE — Progress Notes (Signed)
11/22/2021 at 0630:  Pt reported no episodes of nausea or vomiting throughout entirety of shift to this RN. No PRN anti-nausea meds requested by pt. RN will continue to monitor.

## 2021-11-23 NOTE — Anesthesia Preprocedure Evaluation (Signed)
Anesthesia Evaluation  Patient identified by MRN, date of birth, ID band Patient awake    Reviewed: Allergy & Precautions, NPO status , Patient's Chart, lab work & pertinent test results  Airway Mallampati: III  TM Distance: <3 FB Neck ROM: Full    Dental  (+) Missing, Dental Advisory Given   Pulmonary neg pulmonary ROS, Current Smoker and Patient abstained from smoking.,    Pulmonary exam normal  + decreased breath sounds      Cardiovascular hypertension, negative cardio ROS Normal cardiovascular exam Rhythm:Regular     Neuro/Psych Anxiety TIAnegative neurological ROS  negative psych ROS   GI/Hepatic negative GI ROS, Neg liver ROS, GERD  Medicated,  Endo/Other  negative endocrine ROSMorbid obesity  Renal/GU negative Renal ROS  negative genitourinary   Musculoskeletal   Abdominal (+) + obese,   Peds negative pediatric ROS (+)  Hematology negative hematology ROS (+)   Anesthesia Other Findings Past Medical History: No date: Anxiety No date: Depression No date: GERD (gastroesophageal reflux disease) No date: Obesity No date: Stroke [facility]) No date: Substance abuse [facility])  Past Surgical History: 09/20/2017: ESOPHAGOGASTRODUODENOSCOPY (EGD) WITH PROPOFOL; N/A     Comment:  Procedure: ESOPHAGOGASTRODUODENOSCOPY (EGD) WITH               PROPOFOL;  Surgeon: Lin Landsman, MD;  Location:               [facility] ENDOSCOPY;  Service: Gastroenterology;  Laterality:               N/A; No date: KNEE SURGERY; Right 11/15/2021: TEE WITHOUT CARDIOVERSION; N/A     Comment:  Procedure: TRANSESOPHAGEAL ECHOCARDIOGRAM (TEE);                Surgeon: Minna Merritts, MD;  Location: [facility] ORS;                Service: Cardiovascular;  Laterality: N/A;  BMI    Body Mass Index: 30.34 kg/m      Reproductive/Obstetrics negative OB ROS                             Anesthesia Physical Anesthesia Plan  ASA:  3  Anesthesia Plan: General   Post-op Pain Management:    Induction: Intravenous  PONV Risk Score and Plan: Propofol infusion and TIVA  Airway Management Planned: Natural Airway  Additional Equipment:   Intra-op Plan:   Post-operative Plan:   Informed Consent: I have reviewed the patients History and Physical, chart, labs and discussed the procedure including the risks, benefits and alternatives for the proposed anesthesia with the patient or authorized representative who has indicated his/her understanding and acceptance.     Dental Advisory Given  Plan Discussed with: CRNA and Surgeon  Anesthesia Plan Comments:         Anesthesia Quick Evaluation

## 2021-11-23 NOTE — Anesthesia Postprocedure Evaluation (Signed)
Anesthesia Post Note  Patient: Samuel Molina  Procedure(s) Performed: COLONOSCOPY WITH PROPOFOL ESOPHAGOGASTRODUODENOSCOPY (EGD)  Patient location during evaluation: PACU Anesthesia Type: General Level of consciousness: awake and oriented Pain management: pain level controlled Vital Signs Assessment: post-procedure vital signs reviewed and stable Respiratory status: spontaneous breathing and respiratory function stable Cardiovascular status: stable Anesthetic complications: no   No notable events documented.   Last Vitals:  Vitals:   11/23/21 1418 11/23/21 1435  BP: 110/71 118/81  Pulse:    Resp:    Temp: (!) 36.1 C   SpO2:      Last Pain:  Vitals:   11/23/21 1435  TempSrc:   PainSc: 0-No pain                 VAN STAVEREN,Maudry Zeidan

## 2021-11-23 NOTE — Progress Notes (Signed)
11/22/2021 at 2058:  IV team consult placed as pt's IV appears infiltrated. Pt stated that he alerted day shift RN. IV pulled by this RN and infusion paused. This RN will continue to monitor.

## 2021-11-23 NOTE — Assessment & Plan Note (Signed)
Lipitor 

## 2021-11-23 NOTE — Progress Notes (Signed)
11/23/2021 at 0534:  Pt reports that stools are "clear." No episodes of bloody stools, pink or blood-tinged emesis reported by pt.

## 2021-11-23 NOTE — Assessment & Plan Note (Signed)
Seen by psychiatrist-stable - Continue Celexa -Continue Seroquel 100 mg daily -Currently denies homicidal and homicidal ideations -No need for IVC or inpatient psych admission per psychiatry Throughout hospitalization, patient has exhibited signs of manipulative behavior, at times refusing to take medication and then denying that he did so.  Stating that nurses refused to give him his medicines when he asked for it, but the nurses documenting that patient refused to take his medicines.

## 2021-11-23 NOTE — Assessment & Plan Note (Signed)
Patient has history of stroke - On aspirin and Lipitor

## 2021-11-23 NOTE — Progress Notes (Signed)
11/20/2021 at 1930:  Day shift RN reported to this RN that pt stated he had multiple vomiting episodes the previous night. No vomiting episodes reported during day shift. Will continue to monitor pt for episodes of nausea/vomiting.

## 2021-11-23 NOTE — Progress Notes (Signed)
Pt refusing scheduled dose of IV Vancomycin. Pt educated about importance of antibiotic regimen, verbalizes understanding, but continues to refuse.

## 2021-11-24 ENCOUNTER — Encounter: Payer: Self-pay | Admitting: Gastroenterology

## 2021-11-24 MED ORDER — ATORVASTATIN CALCIUM 80 MG PO TABS
80.0000 mg | ORAL_TABLET | Freq: Every evening | ORAL | 1 refills | Status: AC
Start: 2021-11-24 — End: ?

## 2021-11-24 MED ORDER — LISINOPRIL 40 MG PO TABS
40.0000 mg | ORAL_TABLET | Freq: Every day | ORAL | 1 refills | Status: AC
Start: 2021-11-24 — End: ?

## 2021-11-24 MED ORDER — QUETIAPINE FUMARATE 100 MG PO TABS: 100.0000 mg | ORAL_TABLET | Freq: Every day | ORAL | 1 refills | Status: DC

## 2021-11-24 MED ORDER — AMLODIPINE BESYLATE 10 MG PO TABS: 10.0000 mg | ORAL_TABLET | Freq: Every day | ORAL | 1 refills | Status: AC

## 2021-11-24 MED ORDER — PANTOPRAZOLE SODIUM 40 MG PO TBEC: 40.0000 mg | DELAYED_RELEASE_TABLET | Freq: Two times a day (BID) | ORAL | 1 refills | Status: AC

## 2021-11-24 MED ORDER — CITALOPRAM HYDROBROMIDE 10 MG PO TABS
10.0000 mg | ORAL_TABLET | Freq: Every day | ORAL | 1 refills | Status: DC
Start: 2021-11-24 — End: 2022-01-15

## 2021-11-24 NOTE — Discharge Summary (Signed)
Physician Discharge Summary   Patient: Samuel Molina MRN: [id number] DOB: 16-Apr-1961  Admit date:     11/08/2021  Discharge date: 11/24/21  Discharge Physician: Annita Brod   PCP: Pcp, No   Recommendations at discharge:   New medication: IV dalbavancin 1500 mg 1 dose on 8/3 and 1 dose on 8/10 Patient given new prescription for Lipitor 80 mg p.o. daily.  He had taken himself off this medication. Medication change: Lisinopril increased to 40 mg p.o. daily Medication change: Seroquel decreased to 100 mg p.o. nightly New medication: Protonix 40 mg p.o. twice daily Patient will follow-up with Dr. Vicente Males, gastroenterology for colonoscopy and endoscopy biopsy results  Discharge Diagnoses: Principal Problem:   Cellulitis and abscess of left leg Active Problems:   MRSA bacteremia   Stroke [facility])   Left-sided weakness   Hypertensive urgency   HLD (hyperlipidemia)   Adjustment disorder with mixed disturbance of emotions and conduct   Personality disorder ([place])   Depression with anxiety   Suicidal ideation   Cocaine abuse ([place])   Tobacco abuse   Gastroesophageal reflux disease with esophagitis   Dyspepsia   Rectal bleeding   Obesity (BMI 30-39.9)  Resolved Problems:   * No resolved hospital problems. [facility] Course: 61M, hx of HTN, HLD, TIA, GERD, multiple psych issue (depression, anxiety, personal disorder, malingering, adjustment disorder), tobacco abuse, cocaine abuse, Bell's palsy, presented with multiple complaints including left-sided weakness numbness, suicidal ideation and left leg wound. Patient seen in the ED underwent lab work essentially unremarkable chest x-ray no acute finding CT head dense right MCA bifurcation without hemorrhage MRI brain negative for acute stroke CTA head and neck negative for LVO, psychiatry consulted patient was admitted.  Wound and blood cultures with MRSA.  Blood cultures also grew coagulase-negative staph and streptococcal species  which is thought to be due to contaminant per ID.  Repeat blood cultures, 2D echo and TEE unremarkable and patient completed 2 weeks of IV antibiotics as of 8/2.  Patient had reports of some bloody stool as well as indigestion and seen by GI and taken for EGD and colonoscopy on 8/2.  EGD noted some mild decrease in esophageal motility along with single colon polyp which was removed and biopsies pending.  Diverticulosis also noted.  Patient was discharged home on 8/3 with OPAT dalbavancin on 8/3 and 8/10.  Assessment and Plan: * Cellulitis and abscess of left leg  S/p of I&D by ED physician.  Patient does not have fever and chills.  Does not meets criteria for sepsis and sepsis ruled out. Wound cultures with MRSA, sensitive to clindamycin, tetracycline and vancomycin.  General surgery was also consulted due to worsening pain but there was no indication for another I&D. -Continue with wound care, even on discharge  MRSA bacteremia Initial blood cultures positive for MRSA most likely secondary to wound infection.  Also found to have Streptococcus species and coagulase-negative staph which was thought to be due to contaminant.  Repeat blood cultures on 11/10/2021 remain negative as well as TEE and TTE.  Patient completed 2 weeks of IV vancomycin on 8/2 with plans for outpatient dalbavancin on 8/3 and 8/10 at home.  Left-sided weakness Etiology is not clear.  MRI of brain is negative for new stroke.  CTA negative for LVO.  Differential diagnosis include TIA versus malingering. -Aspirin, Lipitor   Stroke [facility]) Patient has history of stroke - On aspirin and Lipitor  Hypertensive urgency Blood pressure remained mildly elevated. Blood pressure 203/112 on  admission.  This is due to medication noncompliance.  Patient is not taking blood pressure medications currently. -Increase the dose of amlodipine to 10 mg -Continue lisinopril -IV hydralazine as needed  HLD (hyperlipidemia) -  Lipitor  Adjustment disorder with mixed disturbance of emotions and conduct Seen by psychiatrist-stable - Continue Celexa -Continue Seroquel 100 mg daily -Currently denies homicidal and homicidal ideations -No need for IVC or inpatient psych admission per psychiatry Throughout hospitalization, patient has exhibited signs of manipulative behavior, at times refusing to take medication and then denying that he did so.  Stating that nurses refused to give him his medicines when he asked for it, but the nurses documenting that patient refused to take his medicines.  Personality disorder ([place] [facility]) - See above  Depression with anxiety - See above  Suicidal ideation - Denies any suicidal ideation at this time  Cocaine abuse ([place]) - UDS done on 7/21 was negative. -Counseling was provided  Tobacco abuse - Nicotine patch  Gastroesophageal reflux disease with esophagitis - Continue PPI-increase the frequency to twice daily  Obesity (BMI 30-39.9) Meets criteria BMI greater than 30  Rectal bleeding Status post colonoscopy.  Felt to be secondary to internal hemorrhoids  Dyspepsia Noted decreased esophageal motility.  Biopsies taken and patient with outpatient follow-up         Consultants: Infectious disease Gastroenterology Psychiatry Neurology General surgery Wound care Cardiology   Procedures: TEE 2D echo Colonoscopy EGD Placement of PICC line  Disposition: Home health Diet recommendation:  Discharge Diet Orders (From admission, onward)     Start     Ordered   11/24/21 0000  Diet - low sodium heart healthy        11/24/21 0822           Carb modified diet DISCHARGE MEDICATION: Allergies as of 11/24/2021       Reactions   Acetaminophen Itching, Nausea And Vomiting, Rash        Medication List     STOP taking these medications    aspirin EC 81 MG tablet   famotidine 20 MG tablet Commonly known as: PEPCID   gabapentin 300 MG capsule Commonly  known as: Neurontin       TAKE these medications    amLODipine 10 MG tablet Commonly known as: NORVASC Take 1 tablet (10 mg total) by mouth daily.   atorvastatin 80 MG tablet Commonly known as: LIPITOR Take 1 tablet (80 mg total) by mouth every evening.   citalopram 10 MG tablet Commonly known as: CELEXA Take 1 tablet (10 mg total) by mouth daily.   dalbavancin 1,000 mg in dextrose 5 % 500 mL Inject 1,000 mg into the vein once for 1 dose. Indication: MRSA thigh abscess and Bacteremia Give Dalbavancin '1500mg'$  IV x 1 on 8/3 and Dalbavancin '1000mg'$  IV x 1 on 8/10 Labs - Once on 11/29/2021:  CBC/D, CMP Fax lab results  promptly to Dr.Ravishankar [phone] Method of administration: IVBP Method of administration may be changed at the discretion of home infusion pharmacist based upon assessment of the patient and/or caregiver's ability to self-administer the medication ordered. Start taking on: December 01, 2021   lisinopril 40 MG tablet Commonly known as: ZESTRIL Take 1 tablet (40 mg total) by mouth daily. What changed:  medication strength how much to take   pantoprazole 40 MG tablet Commonly known as: PROTONIX Take 1 tablet (40 mg total) by mouth 2 (two) times daily.   QUEtiapine 100 MG tablet Commonly known as: SEROQUEL Take 1 tablet (  100 mg total) by mouth at bedtime. What changed:  medication strength how much to take       ASK your doctor about these medications    dalbavancin 1,500 mg in dextrose 5 % 500 mL Inject 1,500 mg into the vein once for 1 dose. Indication: MRSA thigh abscess and Bacteremia Give Dalbavancin '1500mg'$  IV x 1 on 8/3 and Dalbavancin '1000mg'$  IV x 1 on 8/10 Labs - Once on 11/29/2021:  CBC/D, CMP Fax lab results  promptly to Dr.Ravishankar [phone] Method of administration: IVBP Method of administration may be changed at the discretion of home infusion pharmacist based upon assessment of the patient and/or caregiver's ability to self-administer  the medication ordered. Ask about: Should I take this medication?               Discharge Care Instructions  (From admission, onward)           Start     Ordered   11/24/21 0000  Discharge wound care:       Comments: Every day: Remove the packing from the abscess I&D site of the left thigh. Replace it with a segment of Iodoform packing strip Kellie Simmering [phone], or Kellie Simmering [phone]). Leave a piece hanging out to ease removal. Cover with dry gauze, tape in place.   11/24/21 0822            Discharge Exam: Danley Danker Weights   11/08/21 0532 11/15/21 1251  Weight: 102 kg 104.3 kg   General: Alert and oriented x3, no acute distress Cardiovascular: Regular rate and rhythm, S1-S2  Condition at discharge: good  The results of significant diagnostics from this hospitalization (including imaging, microbiology, ancillary and laboratory) are listed below for reference.   Imaging Studies: DG Abd 2 Views  Result Date: 11/20/2021 CLINICAL DATA:  Acute abdominal pain. EXAM: ABDOMEN - 2 VIEW COMPARISON:  09/04/2017 radiographs FINDINGS: No dilated bowel loops are noted. Gas and stool within the colon and rectum are noted. Nondistended small bowel is noted. No suspicious calcifications are present. There is no evidence of pneumoperitoneum. No acute bony abnormalities are present. IMPRESSION: No acute abnormality.  Unremarkable bowel gas pattern. Electronically Signed   By: Margarette Canada M.D.   On: 11/20/2021 14:32   DG Knee 1-2 Views Right  Result Date: 11/16/2021 CLINICAL DATA:  Fall with right knee pain. EXAM: RIGHT KNEE - 1-2 VIEW COMPARISON:  Study of 10/20/2019. FINDINGS: There are moderately prominent tricompartmental marginal osteophytes, relatively mild joint space loss except in the patellofemoral joint where the joint narrowing is moderate and increased. There is normal bone mineralization with no evidence of fractures. There is a small suprapatellar bursal effusion and small loose body in  the fluid. The superficial soft tissues are unremarkable. IMPRESSION: 1. Degenerative changes without evidence of fractures. 2. Small suprapatellar bursal fluid with small loose body in the fluid. Electronically Signed   By: Telford Nab M.D.   On: 11/16/2021 06:21   ECHO TEE  Result Date: 11/15/2021    TRANSESOPHOGEAL ECHO REPORT   Patient Name:   Samuel Molina Date of Exam: 11/15/2021 Medical Rec #:  [id number]         Height:       73.0 in Accession #:    [id number]        Weight:       230.0 lb Date of Birth:  1960-10-18         BSA:          2.283 m Patient  Age:    28 years          BP:           124/80 mmHg Patient Gender: M                 HR:           72 bpm. Exam Location:  [facility] Procedure: Transesophageal Echo Indications:    bacteremia, r/o endocarditis  History:        Patient has prior history of Echocardiogram examinations.                 Bacteremia.  Sonographer:    Sherrie Sport Referring Phys: [id number] CHRISTOPHER RONALD BERGE PROCEDURE: TEE procedure time was 25 minutes. The transesophogeal probe was passed without difficulty through the esophogus of the patient. Imaged were obtained with the patient in a left lateral decubitus position. Local oropharyngeal anesthetic was provided with Cetacaine and viscous lidocaine. Sedation performed by performing physician. Patients was under conscious sedation during this procedure. Anesthetic administered: 162mg of Fentanyl, 4.'0mg'$  of Versed. The patient's vital signs; including heart rate, blood pressure, and oxygen saturation; remained stable throughout the procedure. The patient developed no complications during the procedure. IMPRESSIONS  1. No valve vegetation noted concerning for endocarditis  2. Left ventricular ejection fraction, by estimation, is 60 to 65%. The left ventricle has normal function. The left ventricle has no regional wall motion abnormalities.  3. Right ventricular systolic function is normal. The right ventricular size is normal.  4.  No left atrial/left atrial appendage thrombus was detected.  5. The mitral valve is normal in structure. Mild mitral valve regurgitation. No evidence of mitral stenosis.  6. The aortic valve is normal in structure. Aortic valve regurgitation is not visualized. No aortic stenosis is present.  7. The inferior vena cava is normal in size with greater than 50% respiratory variability, suggesting right atrial pressure of 3 mmHg.  8. Agitated saline contrast bubble study was negative, with no evidence of any interatrial shunt. Conclusion(s)/Recommendation(s): Normal biventricular function without evidence of hemodynamically significant valvular heart disease. FINDINGS  Left Ventricle: Left ventricular ejection fraction, by estimation, is 60 to 65%. The left ventricle has normal function. The left ventricle has no regional wall motion abnormalities. The left ventricular internal cavity size was normal in size. There is  no left ventricular hypertrophy. Right Ventricle: The right ventricular size is normal. No increase in right ventricular wall thickness. Right ventricular systolic function is normal. Left Atrium: Left atrial size was normal in size. No left atrial/left atrial appendage thrombus was detected. Right Atrium: Right atrial size was normal in size. Pericardium: There is no evidence of pericardial effusion. Mitral Valve: The mitral valve is normal in structure. Mild mitral valve regurgitation. No evidence of mitral valve stenosis. Tricuspid Valve: The tricuspid valve is normal in structure. Tricuspid valve regurgitation is mild . No evidence of tricuspid stenosis. Aortic Valve: The aortic valve is normal in structure. Aortic valve regurgitation is not visualized. No aortic stenosis is present. Pulmonic Valve: The pulmonic valve was normal in structure. Pulmonic valve regurgitation is not visualized. No evidence of pulmonic stenosis. Aorta: The aortic root is normal in size and structure. There is minimal (Grade  I) atheroma plaque involving the descending aorta. Venous: The inferior vena cava is normal in size with greater than 50% respiratory variability, suggesting right atrial pressure of 3 mmHg. IAS/Shunts: No atrial level shunt detected by color flow Doppler. Agitated saline contrast was given intravenously to  evaluate for intracardiac shunting. Agitated saline contrast bubble study was negative, with no evidence of any interatrial shunt. Ida Rogue MD Electronically signed by Ida Rogue MD Signature Date/Time: 11/15/2021/1:44:16 PM    Final    ECHOCARDIOGRAM COMPLETE  Result Date: 11/14/2021    ECHOCARDIOGRAM REPORT   Patient Name:   Samuel Molina Date of Exam: 11/14/2021 Medical Rec #:  [id number]         Height:       73.0 in Accession #:    [id number]        Weight:       224.9 lb Date of Birth:  [birth year]         BSA:          2.262 m Patient Age:    72 years          BP:           122/68 mmHg Patient Gender: M                 HR:           59 bpm. Exam Location:  [facility] Procedure: 2D Echo, Color Doppler and Cardiac Doppler Indications:     R78.81 Bacteremia  History:         Patient has no prior history of Echocardiogram examinations.                  Stroke.  Sonographer:     Charmayne Sheer Referring Phys:  [id number] Tsosie Billing Diagnosing Phys: Neoma Laming  Sonographer Comments: Suboptimal subcostal window. IMPRESSIONS  1. Left ventricular ejection fraction, by estimation, is 60 to 65%. The left ventricle has normal function. The left ventricle has no regional wall motion abnormalities. There is severe concentric left ventricular hypertrophy. Left ventricular diastolic  parameters were normal.  2. Right ventricular systolic function is normal. The right ventricular size is normal.  3. The mitral valve is normal in structure. No evidence of mitral valve regurgitation. No evidence of mitral stenosis.  4. The aortic valve is normal in structure. Aortic valve regurgitation is not visualized. No  aortic stenosis is present.  5. The inferior vena cava is normal in size with greater than 50% respiratory variability, suggesting right atrial pressure of 3 mmHg. FINDINGS  Left Ventricle: Left ventricular ejection fraction, by estimation, is 60 to 65%. The left ventricle has normal function. The left ventricle has no regional wall motion abnormalities. The left ventricular internal cavity size was normal in size. There is  severe concentric left ventricular hypertrophy. Left ventricular diastolic parameters were normal. Right Ventricle: The right ventricular size is normal. No increase in right ventricular wall thickness. Right ventricular systolic function is normal. Left Atrium: Left atrial size was normal in size. Right Atrium: Right atrial size was normal in size. Pericardium: There is no evidence of pericardial effusion. Mitral Valve: The mitral valve is normal in structure. No evidence of mitral valve regurgitation. No evidence of mitral valve stenosis. Tricuspid Valve: The tricuspid valve is normal in structure. Tricuspid valve regurgitation is not demonstrated. No evidence of tricuspid stenosis. Aortic Valve: The aortic valve is normal in structure. Aortic valve regurgitation is not visualized. No aortic stenosis is present. Aortic valve mean gradient measures 3.0 mmHg. Aortic valve peak gradient measures 5.3 mmHg. Aortic valve area, by VTI measures 4.31 cm. Pulmonic Valve: The pulmonic valve was normal in structure. Pulmonic valve regurgitation is not visualized. No evidence of pulmonic stenosis. Aorta: The aortic root is normal in  size and structure. Venous: The inferior vena cava is normal in size with greater than 50% respiratory variability, suggesting right atrial pressure of 3 mmHg. IAS/Shunts: No atrial level shunt detected by color flow Doppler.  LEFT VENTRICLE PLAX 2D LVIDd:         4.80 cm   Diastology LVIDs:         2.80 cm   LV e' medial:    6.96 cm/s LV PW:         1.40 cm   LV E/e' medial:   11.0 LV IVS:        0.90 cm   LV e' lateral:   10.90 cm/s LVOT diam:     2.20 cm   LV E/e' lateral: 7.0 LV SV:         103 LV SV Index:   45 LVOT Area:     3.80 cm  RIGHT VENTRICLE RV Basal diam:  3.50 cm TAPSE (M-mode): 2.5 cm LEFT ATRIUM             Index        RIGHT ATRIUM          Index LA diam:        4.30 cm 1.90 cm/m   RA Area:     9.59 cm LA Vol (A2C):   48.8 ml 21.58 ml/m  RA Volume:   18.20 ml 8.05 ml/m LA Vol (A4C):   47.8 ml 21.14 ml/m LA Biplane Vol: 49.5 ml 21.89 ml/m  AORTIC VALVE                    PULMONIC VALVE AV Area (Vmax):    4.30 cm     PV Vmax:       1.08 m/s AV Area (Vmean):   3.81 cm     PV Peak grad:  4.7 mmHg AV Area (VTI):     4.31 cm AV Vmax:           115.00 cm/s AV Vmean:          81.700 cm/s AV VTI:            0.238 m AV Peak Grad:      5.3 mmHg AV Mean Grad:      3.0 mmHg LVOT Vmax:         130.00 cm/s LVOT Vmean:        81.900 cm/s LVOT VTI:          0.270 m LVOT/AV VTI ratio: 1.13  AORTA Ao Root diam: 3.30 cm MITRAL VALVE MV Area (PHT): 2.94 cm    SHUNTS MV Decel Time: 258 msec    Systemic VTI:  0.27 m MV E velocity: 76.70 cm/s  Systemic Diam: 2.20 cm MV A velocity: 71.60 cm/s MV E/A ratio:  1.07 Shaukat Khan Electronically signed by Neoma Laming Signature Date/Time: 11/14/2021/10:28:44 AM    Final    MR BRAIN WO CONTRAST  Result Date: 11/08/2021 CLINICAL DATA:  Left-sided weakness with slurred speech. EXAM: MRI HEAD WITHOUT CONTRAST TECHNIQUE: Multiplanar, multiecho pulse sequences of the brain and surrounding structures were obtained without intravenous contrast. COMPARISON:  04/28/2021 FINDINGS: Brain: No acute infarction, hemorrhage, hydrocephalus, extra-axial collection or mass effect. Subcentimeter lipoma posterior to the left inferior colliculus. Vascular: Major flow voids are preserved.  There was preceding CTA. Skull and upper cervical spine: No focal marrow lesion Sinuses/Orbits: Negative IMPRESSION: Stable motion degraded brain MRI.  No acute infarct.  Electronically Signed   By: Roderic Palau  Watts M.D.   On: 11/08/2021 07:15   CT ANGIO HEAD NECK W WO CM  Result Date: 11/08/2021 CLINICAL DATA:  Stroke follow-up EXAM: CT ANGIOGRAPHY HEAD AND NECK TECHNIQUE: Multidetector CT imaging of the head and neck was performed using the standard protocol during bolus administration of intravenous contrast. Multiplanar CT image reconstructions and MIPs were obtained to evaluate the vascular anatomy. Carotid stenosis measurements (when applicable) are obtained utilizing NASCET criteria, using the distal internal carotid diameter as the denominator. RADIATION DOSE REDUCTION: This exam was performed according to the departmental dose-optimization program which includes automated exposure control, adjustment of the mA and/or kV according to patient size and/or use of iterative reconstruction technique. CONTRAST:  81m OMNIPAQUE IOHEXOL 350 MG/ML SOLN COMPARISON:  04/28/2021 FINDINGS: CTA NECK FINDINGS Aortic arch: Atheromatous plaque. Right carotid system: No stenosis or ulceration. Left carotid system: No stenosis or ulceration. Vertebral arteries: No proximal subclavian stenosis. The vertebral arteries are smoothly contoured and widely patent to the dura. Skeleton: Cervical spine degeneration.  No acute finding Other neck: Negative Upper chest: Negative Review of the MIP images confirms the above findings CTA HEAD FINDINGS Anterior circulation: No major branch occlusion, beading, or flow limiting stenosis. Negative for aneurysm. Stable appearance of right MCA with mainstem serving numerous branches. More typical bifurcation branching pattern on the left. Posterior circulation: The vertebral and basilar arteries are smoothly contoured and widely patent. No branch occlusion, beading, or aneurysm Venous sinuses: Diffusely patent Anatomic variants: None significant Review of the MIP images confirms the above findings These results were called by telephone at the time of  interpretation on 11/08/2021 at 5:36 am to [facility], who verbally acknowledged these results. IMPRESSION: Negative CTA.  No large vessel occlusion or stenosis. Electronically Signed   By: JJorje GuildM.D.   On: 11/08/2021 05:37   CT HEAD CODE STROKE WO CONTRAST  Result Date: 11/08/2021 CLINICAL DATA:  Code stroke.  Weakness and confusion EXAM: CT HEAD WITHOUT CONTRAST TECHNIQUE: Contiguous axial images were obtained from the base of the skull through the vertex without intravenous contrast. RADIATION DOSE REDUCTION: This exam was performed according to the departmental dose-optimization program which includes automated exposure control, adjustment of the mA and/or kV according to patient size and/or use of iterative reconstruction technique. COMPARISON:  04/28/2021 FINDINGS: Brain: No evidence of acute infarction, hemorrhage, hydrocephalus, extra-axial collection or mass lesion/mass effect. Vascular: Dense appearance of the right MCA bifurcation Skull: Normal. Negative for fracture or focal lesion. Sinuses/Orbits: No acute finding. Other: These results were called by telephone at the time of interpretation on 11/08/2021 at 5:21 am [facility], who verbally acknowledged these results. ASPECTS ([facility] Early CT Score) - Ganglionic level infarction (caudate, lentiform nuclei, internal capsule, insula, M1-M3 cortex): 7 - Supraganglionic infarction (M4-M6 cortex): 3 Total score (0-10 with 10 being normal): 10 IMPRESSION: 1. Dense right MCA bifurcation, CTA is pending. 2. Negative for hemorrhage.  ASPECTS is 10. Electronically Signed   By: JJorje GuildM.D.   On: 11/08/2021 05:22   DG Chest 2 View  Result Date: 11/07/2021 CLINICAL DATA:  Hiccups EXAM: CHEST - 2 VIEW COMPARISON:  Chest x-ray dated June 05, 2020; chest x-ray dated May 05, 2020 FINDINGS: The heart size and mediastinal contours are within normal limits. Elevation of the right hemidiaphragm,  similar to prior exams, similar to prior exams. Both lungs are clear. The visualized skeletal structures are unremarkable. IMPRESSION: 1. Lungs are clear. 2. Elevation of the right  hemidiaphragm. Electronically Signed   By: Yetta Glassman M.D.   On: 11/07/2021 10:41    Microbiology: Results for orders placed or performed during the hospital encounter of 11/08/21  Culture, blood (Routine X 2) w Reflex to ID Panel     Status: Abnormal   Collection Time: 11/08/21 10:07 AM   Specimen: BLOOD LEFT HAND  Result Value Ref Range Status   Specimen Description   Final    BLOOD LEFT HAND Performed at [facility], [place] [address] [place], [place] [address]    Special Requests   Final    BOTTLES DRAWN AEROBIC AND ANAEROBIC Blood Culture results may not be optimal due to an excessive volume of blood received in culture bottles Performed at [facility], [address] [place], [place] [address]    Culture  Setup Time   Final    AEROBIC BOTTLE ONLY GRAM POSITIVE COCCI CRITICAL RESULT CALLED TO, READ BACK BY AND VERIFIED WITH: CARISSA DOLAN @ [id number] ON 11/09/2021.Marland KitchenMarland KitchenTKR GRAM STAIN REVIEWED-AGREE WITH RESULT Performed at [facility], [place] [address], [place], [place] [address]    Culture (A)  Final    STAPHYLOCOCCUS AUREUS STREPTOCOCCUS SPECIES METHICILLIN RESISTANT STAPHYLOCOCCUS AUREUS    Report Status 11/15/2021 FINAL  Final   Organism ID, Bacteria STREPTOCOCCUS SPECIES  Final   Organism ID, Bacteria STAPHYLOCOCCUS AUREUS  Final   Organism ID, Bacteria METHICILLIN RESISTANT STAPHYLOCOCCUS AUREUS  Final      Susceptibility   Methicillin resistant staphylococcus aureus - MIC*    CIPROFLOXACIN >=8 RESISTANT Resistant     ERYTHROMYCIN >=8 RESISTANT Resistant     GENTAMICIN <=0.5 SENSITIVE Sensitive     OXACILLIN >=4 RESISTANT Resistant     TETRACYCLINE <=1 SENSITIVE Sensitive     VANCOMYCIN <=0.5 SENSITIVE Sensitive     TRIMETH/SULFA <=10 SENSITIVE Sensitive      CLINDAMYCIN <=0.25 SENSITIVE Sensitive     RIFAMPIN <=0.5 SENSITIVE Sensitive     Inducible Clindamycin NEGATIVE Sensitive     * METHICILLIN RESISTANT STAPHYLOCOCCUS AUREUS   Staphylococcus aureus - MIC*    CIPROFLOXACIN <=0.5 SENSITIVE Sensitive     ERYTHROMYCIN >=8 RESISTANT Resistant     GENTAMICIN <=0.5 SENSITIVE Sensitive     OXACILLIN <=0.25 SENSITIVE Sensitive     TETRACYCLINE <=1 SENSITIVE Sensitive     VANCOMYCIN 1 SENSITIVE Sensitive     TRIMETH/SULFA <=10 SENSITIVE Sensitive     CLINDAMYCIN RESISTANT Resistant     RIFAMPIN <=0.5 SENSITIVE Sensitive     Inducible Clindamycin POSITIVE Resistant     * STAPHYLOCOCCUS AUREUS   Streptococcus species - MIC*    PENICILLIN <=0.06 SENSITIVE Sensitive     CEFTRIAXONE <=0.12 SENSITIVE Sensitive     ERYTHROMYCIN >=8 RESISTANT Resistant     LEVOFLOXACIN 0.5 SENSITIVE Sensitive     VANCOMYCIN 0.25 SENSITIVE Sensitive     * STREPTOCOCCUS SPECIES  Culture, blood (Routine X 2) w Reflex to ID Panel     Status: Abnormal   Collection Time: 11/08/21 10:07 AM   Specimen: BLOOD  Result Value Ref Range Status   Specimen Description BLOOD RIGHT AC  Final   Special Requests   Final    BOTTLES DRAWN AEROBIC AND ANAEROBIC Blood Culture results may not be optimal due to an excessive volume of blood received in culture bottles   Culture  Setup Time   Final    AEROBIC BOTTLE ONLY GRAM POSITIVE COCCI CRITICAL VALUE NOTED.  VALUE IS CONSISTENT WITH PREVIOUSLY REPORTED AND CALLED  VALUE. GRAM STAIN REVIEWED-AGREE WITH RESULT    Culture STAPHYLOCOCCUS AUREUS (A)  Final   Report Status 11/11/2021 FINAL  Final   Organism ID, Bacteria STAPHYLOCOCCUS AUREUS  Final      Susceptibility   Staphylococcus aureus - MIC*    CIPROFLOXACIN <=0.5 SENSITIVE Sensitive     ERYTHROMYCIN >=8 RESISTANT Resistant     GENTAMICIN <=0.5 SENSITIVE Sensitive     OXACILLIN <=0.25 SENSITIVE Sensitive     TETRACYCLINE <=1 SENSITIVE Sensitive     VANCOMYCIN <=0.5 SENSITIVE  Sensitive     TRIMETH/SULFA <=10 SENSITIVE Sensitive     CLINDAMYCIN RESISTANT Resistant     RIFAMPIN <=0.5 SENSITIVE Sensitive     Inducible Clindamycin POSITIVE Resistant     * STAPHYLOCOCCUS AUREUS  Blood Culture ID Panel (Reflexed)     Status: Abnormal   Collection Time: 11/08/21 10:07 AM  Result Value Ref Range Status   Enterococcus faecalis NOT DETECTED NOT DETECTED Final   Enterococcus Faecium NOT DETECTED NOT DETECTED Final   Listeria monocytogenes NOT DETECTED NOT DETECTED Final   Staphylococcus species DETECTED (A) NOT DETECTED Final    Comment: CRITICAL RESULT CALLED TO, READ BACK BY AND VERIFIED WITH: CARISSA DOLAN @ [phone] ON 11/09/2021.Marland KitchenMarland KitchenTKR    Staphylococcus aureus (BCID) DETECTED (A) NOT DETECTED Final    Comment: Methicillin (oxacillin)-resistant Staphylococcus aureus (MRSA). MRSA is predictably resistant to beta-lactam antibiotics (except ceftaroline). Preferred therapy is vancomycin unless clinically contraindicated. Patient requires contact precautions if  hospitalized. CRITICAL RESULT CALLED TO, READ BACK BY AND VERIFIED WITH: CARISSA DOLAN @ [phone] ON 11/09/2021.Marland KitchenMarland KitchenTKR    Staphylococcus epidermidis DETECTED (A) NOT DETECTED Final    Comment: CRITICAL RESULT CALLED TO, READ BACK BY AND VERIFIED WITH: CARISSA DOLAN @ [phone] ON 11/09/2021.Marland KitchenMarland KitchenTKR    Staphylococcus lugdunensis NOT DETECTED NOT DETECTED Final   Streptococcus species DETECTED (A) NOT DETECTED Final    Comment: Not Enterococcus species, Streptococcus agalactiae, Streptococcus pyogenes, or Streptococcus pneumoniae. CRITICAL RESULT CALLED TO, READ BACK BY AND VERIFIED WITH: CARISSA DOLAN @ [phone] ON 11/09/2021.Marland KitchenMarland KitchenTKR    Streptococcus agalactiae NOT DETECTED NOT DETECTED Final   Streptococcus pneumoniae NOT DETECTED NOT DETECTED Final   Streptococcus pyogenes NOT DETECTED NOT DETECTED Final   A.calcoaceticus-baumannii NOT DETECTED NOT DETECTED Final   Bacteroides fragilis NOT DETECTED NOT DETECTED Final    Enterobacterales NOT DETECTED NOT DETECTED Final   Enterobacter cloacae complex NOT DETECTED NOT DETECTED Final   Escherichia coli NOT DETECTED NOT DETECTED Final   Klebsiella aerogenes NOT DETECTED NOT DETECTED Final   Klebsiella oxytoca NOT DETECTED NOT DETECTED Final   Klebsiella pneumoniae NOT DETECTED NOT DETECTED Final   Proteus species NOT DETECTED NOT DETECTED Final   Salmonella species NOT DETECTED NOT DETECTED Final   Serratia marcescens NOT DETECTED NOT DETECTED Final   Haemophilus influenzae NOT DETECTED NOT DETECTED Final   Neisseria meningitidis NOT DETECTED NOT DETECTED Final   Pseudomonas aeruginosa NOT DETECTED NOT DETECTED Final   Stenotrophomonas maltophilia NOT DETECTED NOT DETECTED Final   Candida albicans NOT DETECTED NOT DETECTED Final   Candida auris NOT DETECTED NOT DETECTED Final   Candida glabrata NOT DETECTED NOT DETECTED Final   Candida krusei NOT DETECTED NOT DETECTED Final   Candida parapsilosis NOT DETECTED NOT DETECTED Final   Candida tropicalis NOT DETECTED NOT DETECTED Final   Cryptococcus neoformans/gattii NOT DETECTED NOT DETECTED Final   Methicillin resistance mecA/C DETECTED (A) NOT DETECTED Final    Comment: CRITICAL RESULT CALLED TO, READ BACK BY AND VERIFIED WITH: CARISSA  DOLAN @ 0039 ON 11/09/2021.Marland KitchenMarland KitchenTKR    Meth resistant mecA/C and MREJ DETECTED (A) NOT DETECTED Final    Comment: CRITICAL RESULT CALLED TO, READ BACK BY AND VERIFIED WITH: CARISSA DOLAN @ 5956 ON 11/09/2021.Marland KitchenMarland KitchenTKR Performed at [facility], [place], [place] [address]   Aerobic Culture w Gram Stain (superficial specimen)     Status: None   Collection Time: 11/09/21 12:39 PM   Specimen: Thigh; Wound  Result Value Ref Range Status   Specimen Description   Final    THIGH Performed at [facility], [address] [place], [place] [address]    Special Requests   Final    NONE Performed at [facility], [address]  [place], [place] [address]    Gram Stain   Final    NO WBC SEEN RARE GRAM POSITIVE COCCI Performed at [facility], [place] [address] [place], [place] [address]    Culture FEW METHICILLIN RESISTANT STAPHYLOCOCCUS AUREUS  Final   Report Status 11/11/2021 FINAL  Final   Organism ID, Bacteria METHICILLIN RESISTANT STAPHYLOCOCCUS AUREUS  Final      Susceptibility   Methicillin resistant staphylococcus aureus - MIC*    CIPROFLOXACIN >=8 RESISTANT Resistant     ERYTHROMYCIN >=8 RESISTANT Resistant     GENTAMICIN <=0.5 SENSITIVE Sensitive     OXACILLIN >=4 RESISTANT Resistant     TETRACYCLINE <=1 SENSITIVE Sensitive     VANCOMYCIN <=0.5 SENSITIVE Sensitive     TRIMETH/SULFA <=10 SENSITIVE Sensitive     CLINDAMYCIN <=0.25 SENSITIVE Sensitive     RIFAMPIN <=0.5 SENSITIVE Sensitive     Inducible Clindamycin NEGATIVE Sensitive     * FEW METHICILLIN RESISTANT STAPHYLOCOCCUS AUREUS  Culture, blood (Routine X 2) w Reflex to ID Panel     Status: None   Collection Time: 11/10/21 10:04 AM   Specimen: BLOOD  Result Value Ref Range Status   Specimen Description BLOOD LEFT AC  Final   Special Requests   Final    BOTTLES DRAWN AEROBIC AND ANAEROBIC Blood Culture adequate volume   Culture   Final    NO GROWTH 5 DAYS Performed at [facility], [address] [place], [place] [address]    Report Status 11/15/2021 FINAL  Final  Culture, blood (Routine X 2) w Reflex to ID Panel     Status: None   Collection Time: 11/10/21 10:04 AM   Specimen: BLOOD  Result Value Ref Range Status   Specimen Description BLOOD LEFT HAND  Final   Special Requests   Final    BOTTLES DRAWN AEROBIC AND ANAEROBIC Blood Culture adequate volume   Culture   Final    NO GROWTH 5 DAYS Performed at [facility], [address] [place], [place] [address]    Report Status 11/15/2021 FINAL  Final    Labs: CBC: Recent Labs  Lab 11/19/21 0702 11/21/21 0852 11/22/21 1118  WBC 9.4 10.4 9.4  HGB 12.9*  13.7 13.7  HCT 40.5 43.8 43.8  MCV 82.0 82.0 82.8  PLT 238 288 355   Basic Metabolic Panel: Recent Labs  Lab 11/19/21 0702 11/21/21 0852 11/22/21 1216  NA  --  139 138  K  --  4.0 4.2  CL  --  101 103  CO2  --  27 27  GLUCOSE  --  134* 163*  BUN  --  15 18  CREATININE 0.80 0.72 0.83  CALCIUM  --  9.1 8.8*   Liver  Function Tests: No results for input(s): "AST", "ALT", "ALKPHOS", "BILITOT", "PROT", "ALBUMIN" in the last 168 hours. CBG: No results for input(s): "GLUCAP" in the last 168 hours.  Discharge time spent: less than 30 minutes.  Signed: Annita Brod, MD [facility] 11/24/2021

## 2021-11-25 DIAGNOSIS — B9562 Methicillin resistant Staphylococcus aureus infection as the cause of diseases classified elsewhere: Secondary | ICD-10-CM | POA: Diagnosis not present

## 2021-11-25 DIAGNOSIS — R7881 Bacteremia: Secondary | ICD-10-CM | POA: Diagnosis not present

## 2021-11-25 LAB — SURGICAL PATHOLOGY

## 2021-11-30 DIAGNOSIS — B9562 Methicillin resistant Staphylococcus aureus infection as the cause of diseases classified elsewhere: Secondary | ICD-10-CM | POA: Diagnosis not present

## 2021-11-30 DIAGNOSIS — R7881 Bacteremia: Secondary | ICD-10-CM | POA: Diagnosis not present

## 2021-12-02 DIAGNOSIS — R7881 Bacteremia: Secondary | ICD-10-CM | POA: Diagnosis not present

## 2021-12-02 DIAGNOSIS — B9562 Methicillin resistant Staphylococcus aureus infection as the cause of diseases classified elsewhere: Secondary | ICD-10-CM | POA: Diagnosis not present

## 2021-12-06 ENCOUNTER — Ambulatory Visit: Payer: 59 | Attending: Infectious Diseases | Admitting: Infectious Diseases

## 2021-12-06 ENCOUNTER — Telehealth: Payer: Self-pay

## 2021-12-06 ENCOUNTER — Encounter: Payer: Self-pay | Admitting: Infectious Diseases

## 2021-12-06 ENCOUNTER — Other Ambulatory Visit
Admission: RE | Admit: 2021-12-06 | Discharge: 2021-12-06 | Disposition: A | Discharge status: Home / Self Care | Payer: 59 | Source: Ambulatory Visit | Attending: Infectious Diseases | Admitting: Infectious Diseases

## 2021-12-06 VITALS — BP 142/80 | HR 71 | Temp 97.0°F | Ht 73.0 in | Wt 236.0 lb

## 2021-12-06 DIAGNOSIS — F32A Depression, unspecified: Secondary | ICD-10-CM | POA: Insufficient documentation

## 2021-12-06 DIAGNOSIS — B9562 Methicillin resistant Staphylococcus aureus infection as the cause of diseases classified elsewhere: Secondary | ICD-10-CM | POA: Diagnosis not present

## 2021-12-06 DIAGNOSIS — F419 Anxiety disorder, unspecified: Secondary | ICD-10-CM | POA: Insufficient documentation

## 2021-12-06 DIAGNOSIS — Z79899 Other long term (current) drug therapy: Secondary | ICD-10-CM | POA: Insufficient documentation

## 2021-12-06 DIAGNOSIS — R7881 Bacteremia: Secondary | ICD-10-CM | POA: Diagnosis not present

## 2021-12-06 DIAGNOSIS — Z8673 Personal history of transient ischemic attack (TIA), and cerebral infarction without residual deficits: Secondary | ICD-10-CM | POA: Diagnosis not present

## 2021-12-06 DIAGNOSIS — Z8614 Personal history of Methicillin resistant Staphylococcus aureus infection: Secondary | ICD-10-CM | POA: Diagnosis not present

## 2021-12-06 DIAGNOSIS — I10 Essential (primary) hypertension: Secondary | ICD-10-CM | POA: Insufficient documentation

## 2021-12-06 DIAGNOSIS — R69 Illness, unspecified: Secondary | ICD-10-CM | POA: Diagnosis not present

## 2021-12-06 LAB — CBC WITH DIFFERENTIAL/PLATELET
Abs Immature Granulocytes: 0.04 10*3/uL (ref 0.00–0.07)
Basophils Absolute: 0 10*3/uL (ref 0.0–0.1)
Basophils Relative: 1 %
Eosinophils Absolute: 0.1 10*3/uL (ref 0.0–0.5)
Eosinophils Relative: 1 %
HCT: 42.4 % (ref 39.0–52.0)
Hemoglobin: 13.6 g/dL (ref 13.0–17.0)
Immature Granulocytes: 1 %
Lymphocytes Relative: 29 %
Lymphs Abs: 2.6 10*3/uL (ref 0.7–4.0)
MCH: 25.4 pg — ABNORMAL LOW (ref 26.0–34.0)
MCHC: 32.1 g/dL (ref 30.0–36.0)
MCV: 79.3 fL — ABNORMAL LOW (ref 80.0–100.0)
Monocytes Absolute: 1.4 10*3/uL — ABNORMAL HIGH (ref 0.1–1.0)
Monocytes Relative: 16 %
Neutro Abs: 4.7 10*3/uL (ref 1.7–7.7)
Neutrophils Relative %: 52 %
Platelets: 346 10*3/uL (ref 150–400)
RBC: 5.35 MIL/uL (ref 4.22–5.81)
RDW: 14.4 % (ref 11.5–15.5)
WBC: 8.8 10*3/uL (ref 4.0–10.5)
nRBC: 0 % (ref 0.0–0.2)

## 2021-12-06 LAB — COMPREHENSIVE METABOLIC PANEL
ALT: 17 U/L (ref 0–44)
AST: 21 U/L (ref 15–41)
Albumin: 4.4 g/dL (ref 3.5–5.0)
Alkaline Phosphatase: 97 U/L (ref 38–126)
Anion gap: 9 (ref 5–15)
BUN: 18 mg/dL (ref 6–20)
CO2: 25 mmol/L (ref 22–32)
Calcium: 9.3 mg/dL (ref 8.9–10.3)
Chloride: 105 mmol/L (ref 98–111)
Creatinine, Ser: 1 mg/dL (ref 0.61–1.24)
GFR, Estimated: >60 mL/min (ref >60)
Glucose, Bld: 83 mg/dL (ref 70–99)
Potassium: 3.3 mmol/L — ABNORMAL LOW (ref 3.5–5.1)
Sodium: 139 mmol/L (ref 135–145)
Total Bilirubin: 0.8 mg/dL (ref 0.3–1.2)
Total Protein: 8.6 g/dL — ABNORMAL HIGH (ref 6.5–8.1)

## 2021-12-06 NOTE — Telephone Encounter (Signed)
-----   Message from Tsosie Billing, MD sent at 12/06/2021  1:57 PM EDT ----- Please let him know that his labs drawn tday showed normal white count, platelet, normal kidney function and liver function- Potassium was low at 3.3. HE should continue with K rich food like banana /tomatoes. Follow up with PCP. thx

## 2021-12-06 NOTE — Progress Notes (Signed)
NAME: Samuel Molina  DOB: 1960/05/26  MRN: [id number]  Date/Time: 12/06/2021 10:39 AM  ? Samuel Molina is a 61 y.o. male with a history of HTN, CVA, anxiety,SUD is here for follow up after recent hospitalization for MRSA bacteremia and MRSA skin and soft tissue infection posterior left thigh. HE was in the hospital 7/18-8/3  Had I/D in the ED Was treated with Iv vancomycin TEE on 11/15/21 neg for Endocarditis. Repeat blood culture on 11/09/21 neg  While in the hospital he ws also c/o abdominal distension, pain, diarrhea and underwent EGD for blood stain vomitus and found to have esophageal dysmotility. The colonoscopy was not adequate due to poor preparation After 2 weeks of IV vanco , he was sent home to complete 2 more doses of weekly dalbavancin HE is here for follow up with his sister Says he feels bloated, diarrhea alternating with constipation which were there in the hospital. HE went to [facility] ED yesterday for dizziness /heat exhaustion and got IV fluids. HE says he has gained close to 10 pounds since discharge No fever / chills/rash  He has completed 2 dose sof dalbavancin 8/3 and 8/11  Past Medical History:  Diagnosis Date   Anxiety    Depression    GERD (gastroesophageal reflux disease)    Obesity    Stroke [facility])    Substance abuse ([place])     Past Surgical History:  Procedure Laterality Date   COLONOSCOPY WITH PROPOFOL N/A 11/23/2021   Procedure: COLONOSCOPY WITH PROPOFOL;  Surgeon: [name], MD;  Location: [facility] ENDOSCOPY;  Service: Gastroenterology;  Laterality: N/A;   ESOPHAGOGASTRODUODENOSCOPY N/A 11/23/2021   Procedure: ESOPHAGOGASTRODUODENOSCOPY (EGD);  Surgeon: [name], MD;  Location: [facility] ENDOSCOPY;  Service: Gastroenterology;  Laterality: N/A;   ESOPHAGOGASTRODUODENOSCOPY (EGD) WITH PROPOFOL N/A 09/20/2017   Procedure: ESOPHAGOGASTRODUODENOSCOPY (EGD) WITH PROPOFOL;  Surgeon: Lin Landsman, MD;  Location: [facility] ENDOSCOPY;  Service: Gastroenterology;   Laterality: N/A;   KNEE SURGERY Right    TEE WITHOUT CARDIOVERSION N/A 11/15/2021   Procedure: TRANSESOPHAGEAL ECHOCARDIOGRAM (TEE);  Surgeon: Minna Merritts, MD;  Location: [facility] ORS;  Service: Cardiovascular;  Laterality: N/A;    Social History   Socioeconomic History   Marital status: Single    Spouse name: Not on file   Number of children: Not on file   Years of education: Not on file   Highest education level: Not on file  Occupational History   Not on file  Tobacco Use   Smoking status: Every Day    Packs/day: 0.50    Types: Cigarettes   Smokeless tobacco: Never  Vaping Use   Vaping Use: Never used  Substance and Sexual Activity   Alcohol use: Not Currently   Drug use: Yes    Types: Cocaine    Comment: used two days ago   Sexual activity: Not Currently  Other Topics Concern   Not on file  Social History Narrative   Not on file   Social Determinants of Health   Financial Resource Strain: Not on file  Food Insecurity: Not on file  Transportation Needs: Not on file  Physical Activity: Not on file  Stress: Not on file  Social Connections: Not on file  Intimate Partner Violence: Not on file    Family History  Problem Relation Age of Onset   Vasculitis Mother    Allergies  Allergen Reactions   Acetaminophen Itching, Nausea And Vomiting and Rash   I? Current Outpatient Medications  Medication Sig Dispense Refill   amLODipine (NORVASC)  10 MG tablet Take 1 tablet (10 mg total) by mouth daily. 30 tablet 1   atorvastatin (LIPITOR) 80 MG tablet Take 1 tablet (80 mg total) by mouth every evening. 30 tablet 1   citalopram (CELEXA) 10 MG tablet Take 1 tablet (10 mg total) by mouth daily. 30 tablet 1   lisinopril (ZESTRIL) 40 MG tablet Take 1 tablet (40 mg total) by mouth daily. 30 tablet 1   pantoprazole (PROTONIX) 40 MG tablet Take 1 tablet (40 mg total) by mouth 2 (two) times daily. 60 tablet 1   QUEtiapine (SEROQUEL) 100 MG tablet Take 1 tablet (100 mg total) by  mouth at bedtime. 100 tablet 1   No current facility-administered medications for this visit.     Abtx:  Anti-infectives (From admission, onward)    None       REVIEW OF SYSTEMS:  Const: negative fever, negative chills, + weight gain Eyes: negative diplopia or visual changes, negative eye pain ENT: negative coryza, negative sore throat Resp: negative cough, hemoptysis, dyspnea Cards: negative for chest pain, palpitations, lower extremity edema GU: negative for frequency, dysuria and hematuria GI: as above Skin: negative for rash and pruritus Heme: negative for easy bruising and gum/nose bleeding MS: fatigue Neurolo:negative for headaches, +dizziness, vertigo, memory problems  Psych: anxiety, depression  Endocrine: negative for thyroid, diabetes Allergy/Immunology- as above Objective:  VITALS:  BP (!) 142/80   Pulse 71   Temp (!) 97 F (36.1 C) (Temporal)   Ht '6\' 1"'$  (1.854 m)   Wt 236 lb (107 kg)   BMI 31.14 kg/m   PHYSICAL EXAM:  General: Alert, cooperative, no distress, appears stated age.  Head: Normocephalic, without obvious abnormality, atraumatic. Eyes: left eye smaller and closed that rt eye ENT Nares normal. No drainage or sinus tenderness. Lips, mucosa, and tongue normal. No Thrush Neck: Supple, symmetrical, no adenopathy, thyroid: non tender no carotid bruit and no JVD. Back: No CVA tenderness. Lungs: Clear to auscultation bilaterally. No Wheezing or Rhonchi. No rales. Heart: Regular rate and rhythm, no murmur, rub or gallop. Abdomen: Soft, obese. Bowel sounds normal. No masses Extremities: left thigh abscess /soft tissue infection- healed completely- just scab 12/06/21   11/08/21   Skin: as above Lymph: Cervical, supraclavicular normal. Neurologic: Grossly non-focal Pertinent Labs Lab Results 8/9 WBC 7.9, HB 13.5, PLT 315 K 3.2, glucose 142 Cr 1.1   Impression/Recommendation ?MRSA bacteremia and MRSA skin and soft tissue infection resolved-  completed 4 week of Iv antibiotic HE took 2 weeks of vanco IV in the hospital- could not send him with PICC due to SUD.positive tox screen After discharge he got 2 weekly dose of dalbavancin. Last dose was 12/02/21 Explained to patient the medicine has worked well and cleared infection- reassured him that dalbavancin was not an experiment drug and  that we send people home once stable and will not kneep them for 4 weeks in the hospital because of IV. Also explained to him that his GI symptoms have been there even before dalba was started  Labs from [facility] good except K 3.2 Will do labs today Asked him to increase K thru food- banana etc  He will follow up with GI for colonoscopy and abdominal symptoms P>S 12/06/21 Cr 1.1 K 3.3 LFTS N  Anxiety , depression on celexa and seroquel Weight gain- could be fluid- follow with PCP ___________________________________________________ Discussed with patient,and sister Follow PRN Note:  This document was prepared using Dragon voice recognition software and may include unintentional dictation errors.

## 2021-12-06 NOTE — Patient Instructions (Addendum)
You are here for follow up of the MRSA infection in the skin and blood. You have completed 2 doses of dlabavancin- today the skin abscess has healed completely- today will do labs

## 2021-12-12 ENCOUNTER — Telehealth: Payer: Self-pay

## 2021-12-12 NOTE — Telephone Encounter (Signed)
Patient lvm requesting call back asap.  I returned patients call but he did not answer.  I was unable to leave voicemail because there was not a voicemail.  No indication on his voicemail what this call was in regards to.  Thanks,  [place], [place]

## 2021-12-13 ENCOUNTER — Other Ambulatory Visit: Payer: Self-pay

## 2021-12-13 ENCOUNTER — Telehealth: Payer: Self-pay

## 2021-12-13 DIAGNOSIS — K625 Hemorrhage of anus and rectum: Secondary | ICD-10-CM

## 2021-12-13 MED ORDER — PEG 3350-KCL-NA BICARB-NACL 420 G PO SOLR: 4000.0000 mL | Freq: Once | ORAL | 0 refills | Status: AC

## 2021-12-13 NOTE — Telephone Encounter (Signed)
Patient has contacted office to schedule his repeat colonoscopy.  Colonoscopy last performed on 11/23/21 by Dr. Vicente Males noted "repeat colonoscopy in 4 weeks because the bowel prep was suboptimal".  Also noted "rectal bleeding likely from internal hemorrhoid-my office will schedule banding at office".  Pt stated that he was not aware of internal hemorrhoids being present.  Informed him that we needed to schedule him an office visit to treat hemorrhoids and he declined as our new patient appts are being scheduled in January.  He has asked to just have his colonoscopy repeated.  He requested to see Dr. Allen Norris however since Dr. Allen Norris does not treat hemorrhoids-he has been scheduled with Dr. Vicente Males on 01/17/22.

## 2021-12-14 ENCOUNTER — Emergency Department: Payer: 59

## 2021-12-14 ENCOUNTER — Other Ambulatory Visit: Payer: Self-pay

## 2021-12-14 ENCOUNTER — Encounter: Payer: Self-pay | Admitting: Emergency Medicine

## 2021-12-14 ENCOUNTER — Emergency Department
Admission: EM | Admit: 2021-12-14 | Discharge: 2021-12-14 | Disposition: A | Discharge status: Home / Self Care | Payer: 59 | Attending: Emergency Medicine | Admitting: Emergency Medicine

## 2021-12-14 DIAGNOSIS — I10 Essential (primary) hypertension: Secondary | ICD-10-CM | POA: Diagnosis not present

## 2021-12-14 DIAGNOSIS — M25561 Pain in right knee: Secondary | ICD-10-CM | POA: Diagnosis not present

## 2021-12-14 DIAGNOSIS — Z8616 Personal history of COVID-19: Secondary | ICD-10-CM | POA: Insufficient documentation

## 2021-12-14 NOTE — ED Notes (Signed)
Pt verbalized understanding of discharge instructions. Opportunity for questions provided.  

## 2021-12-14 NOTE — Discharge Instructions (Addendum)
You may wear the brace during the day when walking, but you must remove it at night to decrease your risk for developing blood clots.  Please follow-up with your orthopedic doctor.  Your x-ray shows severe arthritis.  Return for any new, worsening, or change in symptoms or other concerns.

## 2021-12-14 NOTE — ED Notes (Signed)
Pt reports history of torn meniscus at R knee; pt denies recent injury; pt unsure if pain came on slowly or suddenly; pt reports pain is "constant"; pt cannot find specific descriptor word other than "pain"; pt able to bend knee to same distance as L knee; pt feels there is a knot at R medial knee that isn't same as L medial knee; no significant swelling or deformity noted to R medial knee currently. Pt in NAD.

## 2021-12-14 NOTE — ED Provider Notes (Signed)
[facility] Provider Note    Event Date/Time   First MD Initiated Contact with Patient 12/14/21 1204     (approximate)   History   Knee Pain   HPI  Samuel Molina is a 61 y.o. male with a past medical history of obesity, depression, hypertension who presents today for evaluation of left knee pain.  Patient reports that this is ongoing for approximately 1 week.  He denies any injury.  He has not noticed any swelling, redness, or warmth.  He is still able to ambulate.  He denies any paresthesias.  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    Cocaine use disorder, severe, dependence (HCC)    TIA (transient ischemic attack) 06/05/2020   Stroke ([place])    Depression    HLD (hyperlipidemia)    Tobacco abuse    Chest pain    Left-sided weakness    COVID-19 virus infection    Bell's palsy 04/26/2020   Malingering 03/25/2020   Personality disorder ([place]) 02/13/2020   Bursitis 02/13/2020   Substance induced mood disorder ([place]) 02/12/2020   Severe recurrent major depression without psychotic features ([place]) 01/21/2020   Cocaine abuse ([place]) 01/21/2020   Hip pain 01/21/2020   Major depression 01/20/2020   Gastroesophageal reflux disease with esophagitis    Morbid obesity ([place]) 09/19/2017   Arthritis of knee 11/03/2015          Physical Exam   Triage Vital Signs: ED Triage Vitals  Enc Vitals Group     BP 12/14/21 1146 (!) 174/90      Pulse Rate 12/14/21 1146 62     Resp 12/14/21 1146 18     Temp 12/14/21 1146 98 F (36.7 C)     Temp Source 12/14/21 1146 Oral     SpO2 12/14/21 1146 98 %     Weight 12/14/21 1146 236 lb (107 kg)     Height 12/14/21 1146 '6\' 1"'$  (1.854 m)     Head Circumference --      Peak Flow --      Pain Score 12/14/21 1145 8     Pain Loc --      Pain Edu? --      Excl. in [place]? --     Most recent vital signs: Vitals:   12/14/21 1146 12/14/21 1332  BP: (!) 174/90 (!) 162/92  Pulse: 62 61  Resp: 18 18  Temp: 98 F (36.7 C)   SpO2: 98% 97%    Physical Exam Vitals and nursing note reviewed.  Constitutional:      General: Awake and alert. No acute distress.    Appearance: Normal appearance. The patient is normal weight.  HENT:     Head: Normocephalic and atraumatic.     Mouth: Mucous membranes are moist.  Eyes:     General: PERRL. Normal EOMs        Right eye: No discharge.        Left eye: No discharge.  Conjunctiva/sclera: Conjunctivae normal.  Cardiovascular:     Rate and Rhythm: Normal rate and regular rhythm.     Pulses: Normal pulses.     Heart sounds: Normal heart sounds Pulmonary:     Effort: Pulmonary effort is normal. No respiratory distress.     Breath sounds: Normal breath sounds.  Abdominal:     Abdomen is soft. There is no abdominal tenderness. No rebound or guarding. No distention. Musculoskeletal:        General: No swelling. Normal range of motion.     Cervical back: Normal range of motion and neck supple.  Right knee: No deformity or rash. No joint line tenderness. No patellar tenderness, no ballotment Warm and well perfused extremity with 2+ pedal pulses 5/5 strength to dorsiflexion and plantarflexion at the ankle with intact sensation throughout extremity Normal range of motion of the knee, with intact flexion and extension to active and passive range of motion. Extensor mechanism intact. No ligamentous laxity. Negative anterior/posterior drawer/negative  lachman, negative mcmurrays No effusion or warmth Intact quadriceps, hamstring function, patellar tendon function Pelvis stable Full ROM of ankle without pain or swelling Foot warm and well perfused Skin:    General: Skin is warm and dry.     Capillary Refill: Capillary refill takes less than 2 seconds.     Findings: No rash.  Neurological:     Mental Status: The patient is awake and alert.      ED Results / Procedures / Treatments   Labs (all labs ordered are listed, but only abnormal results are displayed) Labs Reviewed - No data to display   EKG     RADIOLOGY I independently reviewed and interpreted imaging and agree with radiologists findings.     PROCEDURES:  Critical Care performed:   Procedures   MEDICATIONS ORDERED IN ED: Medications - No data to display   IMPRESSION / MDM / [place] / ED COURSE  I reviewed the triage vital signs and the nursing notes.   Differential diagnosis includes, but is not limited to, arthritis, effusion, sprain, contusion, dislocation, fracture, joint infection, ligament injury, meniscus injury.  No evidence of neurological deficit or vascular compromise on exam.  He has full active and passive range of motion of his knee, normal distal pulses, sensation intact light touch throughout the leg, normal capillary refill, foot is warm and well-perfused, not consistent with arterial occlusion.  No warmth, erythema, effusion, or constitutional symptoms to suggest septic knee, and patient is able to range normally and has no pain with axial loading.  No fracture/dislocation on X-Ray. No deformity or obvious ligamentous laxity on exam. No constitutional symptoms or effusion to suggest septic joint. No history of immunosuppression. Overall well appearing, vital signs stable. No indication for diagnostic or therapeutic procedure such as arthrocentesis.  X-ray does reveal moderate tricompartmental knee osteoarthritis prominent in the  patellofemoral compartment.  No large joint effusion noted.  These findings were discussed with the patient.  He was advised to follow-up with orthopedics for further management.  He was given an Ace wrap for extra support.  Return precautions and care instructions discussed. Outpatient follow-up advised. Patient agrees with plan of care.   Patient's presentation is most consistent with acute complicated illness / injury requiring diagnostic workup.      FINAL CLINICAL IMPRESSION(S) / ED DIAGNOSES   Final diagnoses:  Acute pain of right knee     Rx / DC Orders   ED Discharge Orders     None  Note:  This document was prepared using Dragon voice recognition software and may include unintentional dictation errors.   Emeline Gins 12/14/21 1344    Arta Silence, MD 12/14/21 [phone]

## 2021-12-14 NOTE — ED Triage Notes (Signed)
Patient arrives ambulatory c/o right knee pain x 1 week. Patient denies any injury.

## 2021-12-21 DIAGNOSIS — M25561 Pain in right knee: Secondary | ICD-10-CM | POA: Diagnosis not present

## 2021-12-22 ENCOUNTER — Other Ambulatory Visit: Payer: Self-pay | Admitting: Orthopedic Surgery

## 2021-12-22 DIAGNOSIS — M25561 Pain in right knee: Secondary | ICD-10-CM

## 2021-12-23 ENCOUNTER — Encounter: Payer: Self-pay | Admitting: Gastroenterology

## 2022-01-04 ENCOUNTER — Other Ambulatory Visit: Payer: Self-pay

## 2022-01-04 ENCOUNTER — Emergency Department: Payer: 59

## 2022-01-04 ENCOUNTER — Encounter: Payer: Self-pay | Admitting: Emergency Medicine

## 2022-01-04 ENCOUNTER — Emergency Department
Admission: EM | Admit: 2022-01-04 | Discharge: 2022-01-04 | Disposition: A | Discharge status: Home / Self Care | Payer: 59 | Attending: Emergency Medicine | Admitting: Emergency Medicine

## 2022-01-04 DIAGNOSIS — M25561 Pain in right knee: Secondary | ICD-10-CM | POA: Insufficient documentation

## 2022-01-04 DIAGNOSIS — U071 COVID-19: Secondary | ICD-10-CM | POA: Diagnosis not present

## 2022-01-04 DIAGNOSIS — R0981 Nasal congestion: Secondary | ICD-10-CM | POA: Diagnosis not present

## 2022-01-04 DIAGNOSIS — B349 Viral infection, unspecified: Secondary | ICD-10-CM

## 2022-01-04 LAB — RESP PANEL BY RT-PCR (FLU A&B, COVID) ARPGX2
Influenza A by PCR: NEGATIVE
Influenza B by PCR: NEGATIVE
SARS Coronavirus 2 by RT PCR: POSITIVE — AB

## 2022-01-04 MED ORDER — FLUTICASONE PROPIONATE 50 MCG/ACT NA SUSP: 1.0000 | Freq: Two times a day (BID) | NASAL | 0 refills | Status: DC

## 2022-01-04 MED ORDER — KETOROLAC TROMETHAMINE 30 MG/ML IJ SOLN
30.0000 mg | Freq: Once | INTRAMUSCULAR | Status: AC
  Administered 2022-01-04: 30 mg via INTRAMUSCULAR
  Filled 2022-01-04: qty 1

## 2022-01-04 MED ORDER — KETOROLAC TROMETHAMINE 10 MG PO TABS: 10.0000 mg | ORAL_TABLET | Freq: Four times a day (QID) | ORAL | 0 refills | Status: DC | PRN

## 2022-01-04 MED ORDER — BENZONATATE 100 MG PO CAPS: 100.0000 mg | ORAL_CAPSULE | Freq: Three times a day (TID) | ORAL | 0 refills | Status: AC | PRN

## 2022-01-04 MED ORDER — PSEUDOEPH-BROMPHEN-DM 30-2-10 MG/5ML PO SYRP: 10.0000 mL | ORAL_SOLUTION | Freq: Four times a day (QID) | ORAL | 0 refills | Status: DC | PRN

## 2022-01-04 NOTE — ED Provider Notes (Signed)
[facility] Provider Note  Patient Contact: 7:56 PM (approximate)   History   Knee Pain   HPI  Samuel Molina is a 61 y.o. male who presents the emergency department complaining of right knee pain.  Patient states that roughly 6 to 8 weeks ago he was playing ball with his grandson when he injured his right knee.  Unsure exact mechanism.  Said some ongoing pain.  Was seen once in this department with reassuring x-rays.  Patient did have bacteremia secondary to MRSA in his left thigh in July.  Patient has no concerning fevers, chills, increased edema, erythema of the extremity.  He is here for increased pain to the right knee.  He has seen orthopedics after his initial visit here in the emergency department for this knee pain and is currently scheduled for MRI in 2 days.  Based off of exam, orthopedics is concerned that the patient may have a meniscal tear.  Patient is not wearing a brace.  He does use ibuprofen for symptom relief.  Patient is also taking tramadol for pain.  All patient is here primarily for his right knee he would also like to be tested for COVID.  He states that he has close contact with 2 people that are positive for COVID.  He has congestion, cough, body aches.  No visual changes, neck pain or stiffness, chest pain, shortness of breath, GI complaints.     Physical Exam   Triage Vital Signs: ED Triage Vitals  Enc Vitals Group     BP 01/04/22 1706 (!) 165/91     Pulse Rate 01/04/22 1703 60     Resp 01/04/22 1703 18     Temp 01/04/22 1703 98 F (36.7 C)     Temp Source 01/04/22 1703 Oral     SpO2 01/04/22 1703 97 %     Weight 01/04/22 1703 235 lb (106.6 kg)     Height 01/04/22 1703 '6\' 1"'$  (1.854 m)     Head Circumference --      Peak Flow --      Pain Score 01/04/22 1703 10     Pain Loc --      Pain Edu? --      Excl. in [place]? --     Most recent vital signs: Vitals:   01/04/22 1703 01/04/22 1706  BP:  (!) 165/91  Pulse: 60   Resp:  18   Temp: 98 F (36.7 C)   SpO2: 97%      General: Alert and in no acute distress. ENT:      Ears:       Nose: Mild congestion/rhinnorhea.      Mouth/Throat: Mucous membranes are moist. Neck: No stridor. No cervical spine tenderness to palpation. Hematological/Lymphatic/Immunilogical: No cervical lymphadenopathy. Cardiovascular:  Good peripheral perfusion Respiratory: Normal respiratory effort without tachypnea or retractions. Lungs CTAB. Good air entry to the bases with no decreased or absent breath sounds. Musculoskeletal: Full range of motion to all extremities.  Visualization of the right knee reveals no gross edema, erythema.  No warmth to palpation.  Patient is still able to flex and extend the knee.  Negative for Lachman's, varus and valgus.  Positive McMurray's.  Tender along the medial joint line.  No ballottement.  Pulses sensation intact distally. Neurologic:  No gross focal neurologic deficits are appreciated.  Skin:   No rash noted Other:   ED Results / Procedures / Treatments   Labs (all labs ordered are listed, but  only abnormal results are displayed) Labs Reviewed  RESP PANEL BY RT-PCR (FLU A&B, COVID) [id number]     EKG     RADIOLOGY  I personally viewed, evaluated, and interpreted these images as part of my medical decision making, as well as reviewing the written report by the radiologist.  ED Provider Interpretation: No acute traumatic findings to the right knee.  No joint effusion  DG Knee Complete 4 Views Right  Result Date: 01/04/2022 CLINICAL DATA:  Right knee pain EXAM: RIGHT KNEE - COMPLETE 4+ VIEW COMPARISON:  12/14/2021 FINDINGS: Degenerative changes with joint space narrowing and spurring, most pronounced in the patellofemoral compartment. No joint effusion. No acute bony abnormality. Specifically, no fracture, subluxation, or dislocation. IMPRESSION: Moderate degenerative changes. No acute bony abnormality. Electronically Signed   By: Rolm Baptise M.D.   On: 01/04/2022 18:16    PROCEDURES:  Critical Care performed: No  Procedures   MEDICATIONS ORDERED IN ED: Medications  ketorolac (TORADOL) 30 MG/ML injection 30 mg (has no administration in time range)     IMPRESSION / MDM / ASSESSMENT AND PLAN / ED COURSE  I reviewed the triage vital signs and the nursing notes.                              Differential diagnosis includes, but is not limited to, joint pain, septic arthritis, osteoarthritis, meniscal tear, ligament injury, COVID, flu, virus  Patient's presentation is most consistent with acute presentation with potential threat to life or bodily function.   Patient's diagnosis is consistent with right knee pain, viral illness.  Patient presents to the ED for ongoing right knee pain.  Patient injured his knee 6 to 8 weeks ago.  He was seen in this department for knee pain, had reassuring x-ray.  Patient was then referred to orthopedics for who is concerned that patient could have a meniscal tear and is scheduled the patient for MRI.  This should be performed in 2 days as it is already scheduled.  No new trauma.  No concerning signs and symptoms on physical exam to warrant further investigation with labs or advanced imaging at this time.  Patient does have a history of bacteremia and large abscess to the left leg earlier this year but again patient has no concerning signs of infection to the right leg at this time.  Patient left Toradol shot, Toradol tablets at home for symptom relief.  We will apply a brace as patient is not currently wearing 1.  Patient is to have his MRI performed in 2 days.  Patient is also concerned he may have COVID as he has exposure with symptoms.  We will COVID test the patient patient will follow results on MyChart.  Patient will have symptom control prescriptions at this time for his viral symptoms..  Follow-up primary care and orthopedics as needed.  Patient is given ED precautions to return to the ED  for any worsening or new symptoms.        FINAL CLINICAL IMPRESSION(S) / ED DIAGNOSES   Final diagnoses:  Acute pain of right knee  Viral illness     Rx / DC Orders   ED Discharge Orders          Ordered    ketorolac (TORADOL) 10 MG tablet  Every 6 hours PRN        01/04/22 2010    benzonatate (TESSALON PERLES) 100 MG capsule  3 times daily  PRN        01/04/22 2010    brompheniramine-pseudoephedrine-DM 30-2-10 MG/5ML syrup  4 times daily PRN        01/04/22 2010    fluticasone (FLONASE) 50 MCG/ACT nasal spray  2 times daily        01/04/22 2010             Note:  This document was prepared using Dragon voice recognition software and may include unintentional dictation errors.   Brynda Peon 01/04/22 2015    Harvest Dark, MD 01/04/22 2329

## 2022-01-04 NOTE — ED Triage Notes (Signed)
Pt via POV from home. Pt c/o R knee pain, states he tore his menscus couple of months ago and the past week he has some pain and swelling. No new injury. Pt is A&Ox4 and NAD. Ambulatory to triage.

## 2022-01-06 ENCOUNTER — Other Ambulatory Visit: Payer: 59

## 2022-01-14 ENCOUNTER — Other Ambulatory Visit: Payer: Self-pay

## 2022-01-14 ENCOUNTER — Emergency Department
Admission: EM | Admit: 2022-01-14 | Discharge: 2022-01-15 | Disposition: A | Discharge status: Home / Self Care | Payer: 59 | Attending: Emergency Medicine | Admitting: Emergency Medicine

## 2022-01-14 ENCOUNTER — Encounter: Payer: Self-pay | Admitting: Intensive Care

## 2022-01-14 DIAGNOSIS — F32A Depression, unspecified: Secondary | ICD-10-CM | POA: Insufficient documentation

## 2022-01-14 DIAGNOSIS — Z8616 Personal history of COVID-19: Secondary | ICD-10-CM | POA: Insufficient documentation

## 2022-01-14 DIAGNOSIS — R45851 Suicidal ideations: Secondary | ICD-10-CM | POA: Diagnosis not present

## 2022-01-14 DIAGNOSIS — F17210 Nicotine dependence, cigarettes, uncomplicated: Secondary | ICD-10-CM | POA: Diagnosis not present

## 2022-01-14 DIAGNOSIS — F1414 Cocaine abuse with cocaine-induced mood disorder: Secondary | ICD-10-CM | POA: Diagnosis present

## 2022-01-14 DIAGNOSIS — M25561 Pain in right knee: Secondary | ICD-10-CM | POA: Diagnosis not present

## 2022-01-14 DIAGNOSIS — Y900 Blood alcohol level of less than 20 mg/100 ml: Secondary | ICD-10-CM | POA: Insufficient documentation

## 2022-01-14 DIAGNOSIS — M25461 Effusion, right knee: Secondary | ICD-10-CM | POA: Diagnosis not present

## 2022-01-14 LAB — CBC
HCT: 45.1 % (ref 39.0–52.0)
Hemoglobin: 14 g/dL (ref 13.0–17.0)
MCH: 25.2 pg — ABNORMAL LOW (ref 26.0–34.0)
MCHC: 31 g/dL (ref 30.0–36.0)
MCV: 81.3 fL (ref 80.0–100.0)
Platelets: 321 10*3/uL (ref 150–400)
RBC: 5.55 MIL/uL (ref 4.22–5.81)
RDW: 14.6 % (ref 11.5–15.5)
WBC: 8.4 10*3/uL (ref 4.0–10.5)
nRBC: 0 % (ref 0.0–0.2)

## 2022-01-14 LAB — COMPREHENSIVE METABOLIC PANEL
ALT: 18 U/L (ref 0–44)
AST: 33 U/L (ref 15–41)
Albumin: 4.3 g/dL (ref 3.5–5.0)
Alkaline Phosphatase: 99 U/L (ref 38–126)
Anion gap: 10 (ref 5–15)
BUN: 14 mg/dL (ref 6–20)
CO2: 21 mmol/L — ABNORMAL LOW (ref 22–32)
Calcium: 9.3 mg/dL (ref 8.9–10.3)
Chloride: 109 mmol/L (ref 98–111)
Creatinine, Ser: 0.97 mg/dL (ref 0.61–1.24)
GFR, Estimated: >60 mL/min (ref >60)
Glucose, Bld: 127 mg/dL — ABNORMAL HIGH (ref 70–99)
Potassium: 3.4 mmol/L — ABNORMAL LOW (ref 3.5–5.1)
Sodium: 140 mmol/L (ref 135–145)
Total Bilirubin: 0.7 mg/dL (ref 0.3–1.2)
Total Protein: 8.1 g/dL (ref 6.5–8.1)

## 2022-01-14 LAB — ETHANOL: Alcohol, Ethyl (B): <10 mg/dL (ref <10)

## 2022-01-14 LAB — URINE DRUG SCREEN, QUALITATIVE (ARMC ONLY)
Amphetamines, Ur Screen: NOT DETECTED
Barbiturates, Ur Screen: NOT DETECTED
Benzodiazepine, Ur Scrn: NOT DETECTED
Cannabinoid 50 Ng, Ur ~~LOC~~: NOT DETECTED
Cocaine Metabolite,Ur ~~LOC~~: POSITIVE — AB
MDMA (Ecstasy)Ur Screen: NOT DETECTED
Methadone Scn, Ur: NOT DETECTED
Opiate, Ur Screen: NOT DETECTED
Phencyclidine (PCP) Ur S: NOT DETECTED
Tricyclic, Ur Screen: NOT DETECTED

## 2022-01-14 MED ORDER — CITALOPRAM HYDROBROMIDE 20 MG PO TABS
10.0000 mg | ORAL_TABLET | Freq: Every day | ORAL | Status: DC
  Administered 2022-01-14: 10 mg via ORAL
  Filled 2022-01-14: qty 1

## 2022-01-14 MED ORDER — ATORVASTATIN CALCIUM 20 MG PO TABS
80.0000 mg | ORAL_TABLET | Freq: Every evening | ORAL | Status: DC
  Administered 2022-01-14: 80 mg via ORAL
  Filled 2022-01-14: qty 4

## 2022-01-14 MED ORDER — IBUPROFEN 600 MG PO TABS
600.0000 mg | ORAL_TABLET | Freq: Once | ORAL | Status: AC
  Administered 2022-01-14: 600 mg via ORAL
  Filled 2022-01-14: qty 1

## 2022-01-14 MED ORDER — AMLODIPINE BESYLATE 5 MG PO TABS
10.0000 mg | ORAL_TABLET | Freq: Every day | ORAL | Status: DC
  Administered 2022-01-14: 10 mg via ORAL
  Filled 2022-01-14: qty 2

## 2022-01-14 MED ORDER — PANTOPRAZOLE SODIUM 40 MG PO TBEC
40.0000 mg | DELAYED_RELEASE_TABLET | Freq: Two times a day (BID) | ORAL | Status: DC
  Administered 2022-01-14: 40 mg via ORAL
  Filled 2022-01-14: qty 1

## 2022-01-14 MED ORDER — QUETIAPINE FUMARATE 25 MG PO TABS
100.0000 mg | ORAL_TABLET | Freq: Every day | ORAL | Status: DC
  Administered 2022-01-14: 100 mg via ORAL
  Filled 2022-01-14: qty 4

## 2022-01-14 NOTE — ED Provider Notes (Signed)
[facility] Provider Note    Event Date/Time   First MD Initiated Contact with Patient 01/14/22 1556     (approximate)   History   Suicidal   HPI  Samuel Molina is a 61 y.o. male   with a history of previous stroke causing left-sided facial weakness, polysubstance abuse depression  Patient he reports that he has been using cocaine quite heavily for the last several days.  He reports that he started noticing his mood is become very labile.  He called EMS because he was having some thoughts about suicide and felt at one point like he was homicidal towards people around him but does not have a specific person in mind  He started to feel better now does not feel acutely suicidal or homicidal at this time, but reports he is very worried that he is going to need to go through "detox" as he comes off of cocaine which she uses daily.  Medically he was diagnosed with coronavirus and received results on Monday.  He got sick about 2 weeks ago and had a cough runny nose and chills, but now just has a little bit of a runny nose.  No shortness of breath.  He is also had occasional loose stools for the last week since being diagnosed with coronavirus      Physical Exam   Triage Vital Signs: ED Triage Vitals  Enc Vitals Group     BP 01/14/22 1512 (!) 141/108     Pulse Rate 01/14/22 1512 85     Resp 01/14/22 1512 20     Temp 01/14/22 1512 98.2 F (36.8 C)     Temp Source 01/14/22 1512 Oral     SpO2 01/14/22 1512 95 %     Weight 01/14/22 1507 235 lb (106.6 kg)     Height 01/14/22 1507 '6\' 1"'$  (1.854 m)     Head Circumference --      Peak Flow --      Pain Score 01/14/22 1507 10     Pain Loc --      Pain Edu? --      Excl. in [place]? --     Most recent vital signs: Vitals:   01/14/22 1512  BP: (!) 141/108  Pulse: 85  Resp: 20  Temp: 98.2 F (36.8 C)  SpO2: 95%     General: Awake, no distress.  Sitting comfortably in chair.  Calm. Slight weakness  left side of his face patient reports this is chronic from previous stroke CV:  Good peripheral perfusion.  Normal heart tones. Resp:  Normal effort.  Clear bilateral. Abd:  No distention.  Other:  Reports he has had soreness in his right knee now for quite some time, wound right knee appears normal to visual inspection good range of motion but has a slight knee effusion.  No erythema or warmth.   ED Results / Procedures / Treatments   Labs (all labs ordered are listed, but only abnormal results are displayed) Labs Reviewed  COMPREHENSIVE METABOLIC PANEL - Abnormal; Notable for the following components:      Result Value   Potassium 3.4 (*)    CO2 21 (*)    Glucose, Bld 127 (*)    All other components within normal limits  CBC - Abnormal; Notable for the following components:   MCH 25.2 (*)    All other components within normal limits  URINE DRUG SCREEN, QUALITATIVE ([facility] ONLY) - Abnormal; Notable for the following  components:   Cocaine Metabolite,Ur [place] POSITIVE (*)    All other components within normal limits  ETHANOL    RADIOLOGY     PROCEDURES:  Critical Care performed: No  Procedures   MEDICATIONS ORDERED IN ED: Medications  amLODipine (NORVASC) tablet 10 mg (has no administration in time range)  atorvastatin (LIPITOR) tablet 80 mg (has no administration in time range)  citalopram (CELEXA) tablet 10 mg (has no administration in time range)  pantoprazole (PROTONIX) EC tablet 40 mg (has no administration in time range)  ibuprofen (ADVIL) tablet 600 mg (has no administration in time range)     IMPRESSION / MDM / ASSESSMENT AND PLAN / ED COURSE  I reviewed the triage vital signs and the nursing notes.                              Differential diagnosis includes, but is not limited to, polysubstance abuse induced etiologies, and underlying psychiatric illness, depression, etc.  He does report a positive coronavirus test but reports his symptoms started  approximately 2 weeks ago and are largely improving he has no evidence of respiratory distress hypoxia or evidence of decompensation related to his coronavirus diagnosis.  He also has chronic right knee pain and discomfort, will utilize ibuprofen but no evidence of acute change in condition regarding the knee today.  Patient's presentation is most consistent with acute complicated illness / injury requiring diagnostic workup.  He denies use of ethanol, reports he has not a drinker.  At this point seems to withdrawal based on what he states would be most likely related to cocaine use, and in this particular setting we will treat symptomatically if symptoms arise at the present time he reports he last used cocaine this morning and I do not see evidence of acute withdrawal   Clinical Course as of 01/14/22 1622  Sat Jan 14, 2022  1614 Notified Waylan Boga of psychiatry consult [MQ]    Clinical Course User Index [MQ] Delman Kitten, MD   Labs interpreted as normal CBC and metabolic panel.  Drug screen positive for cocaine as anticipated   ----------------------------------------- 5:41 PM on 01/14/2022 ----------------------------------------- Psychiatry advises reassessment and patient to stay in the ER overnight.  Psychiatry does not feel the patient needs under [place] IVC but remain voluntary.  I am in agreement at this time.  Patient calm compliant with care requests and seems to strongly desire to be at the ER for the evening as well.    FINAL CLINICAL IMPRESSION(S) / ED DIAGNOSES   Final diagnoses:  Cocaine abuse with cocaine-induced mood disorder ([place])     Rx / DC Orders   ED Discharge Orders     None        Note:  This document was prepared using Dragon voice recognition software and may include unintentional dictation errors.   Delman Kitten, MD 01/14/22 [phone]

## 2022-01-14 NOTE — BH Assessment (Signed)
Comprehensive Clinical Assessment (CCA) Note  01/14/2022 Samuel Molina [id number]  Chief Complaint:  Chief Complaint  Patient presents with   Suicidal   Mr. Liuzzi arrived to the ED by way of EMS.  He reports, "I used an excessive amount of drugs and got real depressed." He reports to using Cocaine and Heroin.  "I'm having a lot of suicidal thoughts and then everybody started getting on my nerves. I got fed up and I thought about doing something to my own sister.  I never felt like that before.  This was just the last point.  I am hearing a lot of shit in my head that I Shouldn't be."  He reports that he is very depressed. He reports feeling hopeless and worthless. He shared that he had a stroke and that people treat him differently and that depresses him.  He reports symptoms of anxiety. He could not identify the triggers or symptoms.  He denied having auditory or visual hallucinations, but stated, "I can hear people talking in my head". He reports having thoughts of hurting multiple people. He reports that he had a plan to harm himself until his son got involved. He reports increased stress from health concerns and substance use.   Visit Diagnosis:  Cocaine Abuse with Cocaine induced Mood Disorder   CCA Screening, Triage and Referral (STR)  Patient Reported Information How did you hear about [place]? Self  Referral name: No data recorded Referral phone number: No data recorded  Whom do you see for routine medical problems? No data recorded Practice/Facility Name: No data recorded Practice/Facility Phone Number: No data recorded Name of Contact: No data recorded Contact Number: No data recorded Contact Fax Number: No data recorded Prescriber Name: No data recorded Prescriber Address (if known): No data recorded  What Is the Reason for Your Visit/Call Today? Patient reports having depressed mood.  How Long Has This Been Causing You Problems? <Week  What Do You Feel Would Help You  the Most Today? Treatment for Depression or other mood problem; Housing Assistance; Stress Management   Have You Recently Been in Any Inpatient Treatment (Hospital/Detox/Crisis Center/28-Day Program)? No data recorded Name/Location of Program/Hospital:No data recorded How Long Were You There? No data recorded When Were You Discharged? No data recorded  Have You Ever Received Services From [facility] Before? No data recorded Who Do You See at [facility]? No data recorded  Have You Recently Had Any Thoughts About Hurting Yourself? Yes  Are You Planning to Commit Suicide/Harm Yourself At This time? Yes   Have you Recently Had Thoughts About Hurting Someone Guadalupe Dawn? Yes  Explanation: No data recorded  Have You Used Any Alcohol or Drugs in the Past 24 Hours? Yes  How Long Ago Did You Use Drugs or Alcohol? No data recorded What Did You Use and How Much? Cocaine- unknown   Do You Currently Have a Therapist/Psychiatrist? No  Name of Therapist/Psychiatrist: No data recorded  Have You Been Recently Discharged From Any Office Practice or Programs? No  Explanation of Discharge From Practice/Program: No data recorded    CCA Screening Triage Referral Assessment Type of Contact: Face-to-Face  Is this Initial or Reassessment? No data recorded Date Telepsych consult ordered in [facility]:  No data recorded Time Telepsych consult ordered in [facility]:  No data recorded  Patient Reported Information Reviewed? No data recorded Patient Left Without Being Seen? No data recorded Reason for Not Completing Assessment: No data recorded  Collateral Involvement: None provided   Does Patient  Have a Stage manager Guardian? No data recorded Name and Contact of Legal Guardian: No data recorded If Minor and Not Living with Parent(s), Who has Custody? n/a  Is CPS involved or ever been involved? Never  Is APS involved or ever been involved? Never   Patient Determined To Be At Risk for Harm To Self  or Others Based on Review of Patient Reported Information or Presenting Complaint? No  Method: No data recorded Availability of Means: No data recorded Intent: No data recorded Notification Required: No data recorded Additional Information for Danger to Others Potential: No data recorded Additional Comments for Danger to Others Potential: No data recorded Are There Guns or Other Weapons in Your Home? No data recorded Types of Guns/Weapons: No data recorded Are These Weapons Safely Secured?                            No data recorded Who Could Verify You Are Able To Have These Secured: No data recorded Do You Have any Outstanding Charges, Pending Court Dates, Parole/Probation? No data recorded Contacted To Inform of Risk of Harm To Self or Others: No data recorded  Location of Assessment: [facility] ED   Does Patient Present under Involuntary Commitment? No  IVC Papers Initial File Date: 03/01/21   [place] of Residence: [place]   Patient Currently Receiving the Following Services: Not Receiving Services   Determination of Need: Emergent (2 hours)   Options For Referral: Chemical Dependency Intensive Outpatient Therapy (CDIOP)     CCA Biopsychosocial Intake/Chief Complaint:  No data recorded Current Symptoms/Problems: No data recorded  Patient Reported Schizophrenia/Schizoaffective Diagnosis in Past: No data recorded  Strengths: Patient states that he is an honest and loyal person  Preferences: No data recorded Abilities: No data recorded  Type of Services Patient Feels are Needed: No data recorded  Initial Clinical Notes/Concerns: No data recorded  Mental Health Symptoms Depression:   Difficulty Concentrating; Hopelessness; Change in energy/activity; Sleep (too much or little); Tearfulness; Weight gain/loss; Fatigue; Worthlessness; Irritability   Duration of Depressive symptoms:  Greater than two weeks   Mania:   Irritability   Anxiety:    Difficulty  concentrating; Fatigue; Irritability; Restlessness; Sleep; Worrying; Tension   Psychosis:   Hallucinations   Duration of Psychotic symptoms: No data recorded  Trauma:   None   Obsessions:   None   Compulsions:   None   Inattention:   None   Hyperactivity/Impulsivity:   N/A   Oppositional/Defiant Behaviors:   None   Emotional Irregularity:   None   Other Mood/Personality Symptoms:  No data recorded   Mental Status Exam Appearance and self-care  Stature:   Average   Weight:   Average weight   Clothing:   Disheveled   Grooming:   Normal   Cosmetic use:   None   Posture/gait:   Normal   Motor activity:   Slowed   Sensorium  Attention:   Normal   Concentration:   Normal   Orientation:   X5   Recall/memory:   Normal   Affect and Mood  Affect:   Appropriate   Mood:   Depressed   Relating  Eye contact:   None   Facial expression:   Depressed   Attitude toward examiner:   Cooperative   Thought and Language  Speech flow:  Normal   Thought content:   Appropriate to Mood and Circumstances   Preoccupation:   None  Hallucinations:   None   Organization:  No data recorded  [employer] of Knowledge:   Average   Intelligence:   Average   Abstraction:   Normal   Judgement:   Normal   Reality Testing:   Adequate   Insight:   Denial   Decision Making:   Normal   Social Functioning  Social Maturity:   Impulsive   Social Judgement:   Normal   Stress  Stressors:   Other (Comment) (patient denies any current stressors)   Coping Ability:   Normal   Skill Deficits:   None   Supports:   Family (sister)     Religion: Religion/Spirituality Are You A Religious Person?: No How Might This Affect Treatment?: not assessed  Leisure/Recreation: Leisure / Recreation Do You Have Hobbies?: Yes  Exercise/Diet: Exercise/Diet Do You Exercise?: No Have You Gained or Lost A Significant Amount of  Weight in the Past Six Months?: No Do You Follow a Special Diet?: No Do You Have Any Trouble Sleeping?: No   CCA Employment/Education Employment/Work Situation: Employment / Work Situation Employment Situation: Unemployed Patient's Job has Been Impacted by Current Illness: No Has Patient ever Been in Passenger transport manager?: No  Education: Education Is Patient Currently Attending School?: No Did Physicist, medical?: Yes Did You Have An Individualized Education Program (IIEP): No Did You Have Any Difficulty At [employer]?: No   CCA Family/Childhood History Family and Relationship History: Family history Does patient have children?: Yes  Childhood History:  Childhood History By whom was/is the patient raised?: Both parents Did patient suffer any verbal/emotional/physical/sexual abuse as a child?: No Has patient ever been sexually abused/assaulted/raped as an adolescent or adult?: No Witnessed domestic violence?: Yes Has patient been affected by domestic violence as an adult?: No  Child/Adolescent Assessment:     CCA Substance Use Alcohol/Drug Use: Alcohol / Drug Use Pain Medications: See PTA Prescriptions: See PTA Over the Counter: See PTA History of alcohol / drug use?: Yes Longest period of sobriety (when/how long): NA Negative Consequences of Use: Financial, Personal relationships Withdrawal Symptoms: Other (Comment) (none reported) Substance #1 Name of Substance 1: Heroin 1 - Age of First Use: Unknown 1 - Amount (size/oz): Unknown 1 - Frequency: Unknown 1 - Last Use / Amount: Unknown Substance #2 Name of Substance 2: Cocaine 2 - Age of First Use: Unknown 2 - Amount (size/oz): Unknown 2 - Frequency: Unknown 2 - Last Use / Amount: Unknown                     ASAM's:  Six Dimensions of Multidimensional Assessment  Dimension 1:  Acute Intoxication and/or Withdrawal Potential:   Dimension 1:  Description of individual's past and current experiences of substance  use and withdrawal: Patient has no current withdrawal symptoms or complaints  Dimension 2:  Biomedical Conditions and Complications:   Dimension 2:  Description of patient's biomedical conditions and  complications: Patient had a stroke in December most likely contributed to his stroke  Dimension 3:  Emotional, Behavioral, or Cognitive Conditions and Complications:  Dimension 3:  Description of emotional, behavioral, or cognitive conditions and complications: Patient states that he is severly depressed and self-medicating with cocaine  Dimension 4:  Readiness to Change:  Dimension 4:  Description of Readiness to Change criteria: Patient states that he is not ready to stop using cocaine and states that his use is not a problem for him  Dimension 5:  Relapse, Continued use, or Continued Problem  Potential:     Dimension 6:  Recovery/Living Environment:  Dimension 6:  Recovery/Iiving environment criteria description: Patient states that he lives independently and has the support of his sister  ASAM Severity Score: ASAM's Severity Rating Score: 13  ASAM Recommended Level of Treatment: ASAM Recommended Level of Treatment: Level III Residential Treatment   Substance use Disorder (SUD) Substance Use Disorder (SUD)  Checklist Symptoms of Substance Use: Continued use despite having a persistent/recurrent physical/psychological problem caused/exacerbated by use, Continued use despite persistent or recurrent social, interpersonal problems, caused or exacerbated by use, Recurrent use that results in a failure to fulfill major role obligations (work, school, home), Social, occupational, recreational activities given up or reduced due to use  Recommendations for Services/Supports/Treatments: Recommendations for Services/Supports/Treatments Recommendations For Services/Supports/Treatments: Residential-Level 3  DSM5 Diagnoses: Patient Active Problem List   Diagnosis Date Noted   Cocaine abuse with  cocaine-induced mood disorder ([place]) 01/14/2022   Obesity (BMI 30-39.9) 11/23/2021   Dyspepsia    Rectal bleeding    MRSA bacteremia 11/10/2021   Streptococcal bacteremia 11/10/2021   Bacteremia 11/09/2021   Depression with anxiety 11/08/2021   Hypertensive urgency 11/08/2021   Suicidal ideation 11/08/2021   Cellulitis and abscess of left leg 11/08/2021   Aggressive behavior    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   Ear drainage 11/07/2020   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    Cocaine use disorder, severe, dependence (HCC)    TIA (transient ischemic attack) 06/05/2020   Stroke ([place])    Depression    HLD (hyperlipidemia)    Tobacco abuse    Chest pain    Left-sided weakness    COVID-19 virus infection    Bell's palsy 04/26/2020   Malingering 03/25/2020   Personality disorder ([place]) 02/13/2020   Bursitis 02/13/2020   Substance induced mood disorder ([place]) 02/12/2020   Severe recurrent major depression without psychotic features ([place]) 01/21/2020   Cocaine abuse ([place]) 01/21/2020   Hip pain 01/21/2020   Major depression 01/20/2020   Gastroesophageal reflux disease with esophagitis    Morbid obesity ([place]) 09/19/2017   Arthritis of knee 11/03/2015     '@[email or web address]'$ @  Elmer Bales, Counselor

## 2022-01-14 NOTE — ED Notes (Signed)
VOL/ re-assess in the AM

## 2022-01-14 NOTE — Consult Note (Signed)
[facility] Consult   Reason for Consult:  cocaine abuse with homicidal ideations Referring Physician:  EDP Patient Identification: Samuel Molina MRN:  [id number] Principal Diagnosis: Cocaine abuse with cocaine-induced mood disorder ([place]) Diagnosis:  Principal Problem:   Cocaine abuse with cocaine-induced mood disorder ([place])   Total Time spent with patient: 45 minutes  Subjective:   Samuel Molina is a 61 y.o. male patient admitted with homicidal ideations. "Fear I'm going to do something to my little sister and I lover her, so this bothers me"  HPI:   61 y/o male here for thoughts of harming his sister after using cocaine/heroin today; states that he fears he is going to do something to his little sister and this thought bothers him as he feels very close to her; denies having any plan to do so and denies suicidal ideation; states that he has used heroin and cocaine in the past; denies ETOH use; denies hallucinations; pt laying in bed, eating popsicles and eating meal tray, watching TV, in no distress; speaking in complete sentences, answering questions appropriately; A/O x 4; calm, medications restarted and will re-assess in the AM  Past Psychiatric History: cocaine abuse, depression, anxiety  Risk to Self:  none Risk to Others:  none Prior Inpatient Therapy:  multiple Prior Outpatient Therapy:  [facility]  Past Medical History:  Past Medical History:  Diagnosis Date   Anxiety    Depression    GERD (gastroesophageal reflux disease)    Obesity    Stroke (Samuel)    Substance abuse ([place])     Past Surgical History:  Procedure Laterality Date   COLONOSCOPY WITH PROPOFOL N/A 11/23/2021   Procedure: COLONOSCOPY WITH PROPOFOL;  Surgeon: [name], MD;  Location: [facility] ENDOSCOPY;  Service: Gastroenterology;  Laterality: N/A;   ESOPHAGOGASTRODUODENOSCOPY N/A 11/23/2021   Procedure: ESOPHAGOGASTRODUODENOSCOPY (EGD);  Surgeon: [name], MD;  Location: [facility] ENDOSCOPY;   Service: Gastroenterology;  Laterality: N/A;   ESOPHAGOGASTRODUODENOSCOPY (EGD) WITH PROPOFOL N/A 09/20/2017   Procedure: ESOPHAGOGASTRODUODENOSCOPY (EGD) WITH PROPOFOL;  Surgeon: Lin Landsman, MD;  Location: [facility] ENDOSCOPY;  Service: Gastroenterology;  Laterality: N/A;   KNEE SURGERY Right    TEE WITHOUT CARDIOVERSION N/A 11/15/2021   Procedure: TRANSESOPHAGEAL ECHOCARDIOGRAM (TEE);  Surgeon: Minna Merritts, MD;  Location: [facility] ORS;  Service: Cardiovascular;  Laterality: N/A;   Family History:  Family History  Problem Relation Age of Onset   Vasculitis Mother    Family Psychiatric  History: none Social History:  Social History   Substance and Sexual Activity  Alcohol Use Not Currently     Social History   Substance and Sexual Activity  Drug Use Yes   Types: Cocaine   Comment: heroin    Social History   Socioeconomic History   Marital status: Single    Spouse name: Not on file   Number of children: Not on file   Years of education: Not on file   Highest education level: Not on file  Occupational History   Not on file  Tobacco Use   Smoking status: Every Day    Packs/day: 0.50    Types: Cigarettes   Smokeless tobacco: Never  Vaping Use   Vaping Use: Never used  Substance and Sexual Activity   Alcohol use: Not Currently   Drug use: Yes    Types: Cocaine    Comment: heroin   Sexual activity: Not Currently  Other Topics Concern   Not on file  Social History Narrative   Not on file  Social Determinants of Health   Financial Resource Strain: Not on file  Food Insecurity: Not on file  Transportation Needs: Not on file  Physical Activity: Not on file  Stress: Not on file  Social Connections: Not on file   Additional Social History:    Allergies:   Allergies  Allergen Reactions   Acetaminophen Itching, Nausea And Vomiting and Rash    Labs:  Results for orders placed or performed during the hospital encounter of 01/14/22 (from the past 48  hour(s))  Comprehensive metabolic panel     Status: Abnormal   Collection Time: 01/14/22  3:10 PM  Result Value Ref Range   Sodium 140 135 - 145 mmol/L   Potassium 3.4 (L) 3.5 - 5.1 mmol/L   Chloride 109 98 - 111 mmol/L   CO2 21 (L) 22 - 32 mmol/L   Glucose, Bld 127 (H) 70 - 99 mg/dL    Comment: Glucose reference range applies only to samples taken after fasting for at least 8 hours.   BUN 14 6 - 20 mg/dL   Creatinine, Ser 0.97 0.61 - 1.24 mg/dL   Calcium 9.3 8.9 - 10.3 mg/dL   Total Protein 8.1 6.5 - 8.1 g/dL   Albumin 4.3 3.5 - 5.0 g/dL   AST 33 15 - 41 U/L   ALT 18 0 - 44 U/L   Alkaline Phosphatase 99 38 - 126 U/L   Total Bilirubin 0.7 0.3 - 1.2 mg/dL   GFR, Estimated >60 >60 mL/min    Comment: (NOTE) Calculated using the CKD-EPI Creatinine Equation (2021)    Anion gap 10 5 - 15    Comment: Performed at [facility], [address] [place], [place] [address]  Ethanol     Status: None   Collection Time: 01/14/22  3:10 PM  Result Value Ref Range   Alcohol, Ethyl (B) <10 <10 mg/dL    Comment: (NOTE) Lowest detectable limit for serum alcohol is 10 mg/dL.  For medical purposes only. Performed at [facility], [address] [place], [place] [address]   cbc     Status: Abnormal   Collection Time: 01/14/22  3:10 PM  Result Value Ref Range   WBC 8.4 4.0 - 10.5 K/uL   RBC 5.55 4.22 - 5.81 MIL/uL   Hemoglobin 14.0 13.0 - 17.0 g/dL   HCT 45.1 39.0 - 52.0 %   MCV 81.3 80.0 - 100.0 fL   MCH 25.2 (L) 26.0 - 34.0 pg   MCHC 31.0 30.0 - 36.0 g/dL   RDW 14.6 11.5 - 15.5 %   Platelets 321 150 - 400 K/uL   nRBC 0.0 0.0 - 0.2 %    Comment: Performed at [facility], [address] [place], [place] [address]  Urine Drug Screen, Qualitative     Status: Abnormal   Collection Time: 01/14/22  3:10 PM  Result Value Ref Range   Tricyclic, Ur Screen NONE DETECTED NONE DETECTED   Amphetamines, Ur Screen NONE DETECTED NONE DETECTED   MDMA (Ecstasy)Ur Screen  NONE DETECTED NONE DETECTED   Cocaine Metabolite,Ur [place] POSITIVE (A) NONE DETECTED   Opiate, Ur Screen NONE DETECTED NONE DETECTED   Phencyclidine (PCP) Ur S NONE DETECTED NONE DETECTED   Cannabinoid 50 Ng, Ur [place] NONE DETECTED NONE DETECTED   Barbiturates, Ur Screen NONE DETECTED NONE DETECTED   Benzodiazepine, Ur Scrn NONE DETECTED NONE DETECTED   Methadone Scn, Ur NONE DETECTED NONE DETECTED    Comment: (NOTE) Tricyclics + metabolites, urine  Cutoff 1000 ng/mL Amphetamines + metabolites, urine  Cutoff 1000 ng/mL MDMA (Ecstasy), urine              Cutoff 500 ng/mL Cocaine Metabolite, urine          Cutoff 300 ng/mL Opiate + metabolites, urine        Cutoff 300 ng/mL Phencyclidine (PCP), urine         Cutoff 25 ng/mL Cannabinoid, urine                 Cutoff 50 ng/mL Barbiturates + metabolites, urine  Cutoff 200 ng/mL Benzodiazepine, urine              Cutoff 200 ng/mL Methadone, urine                   Cutoff 300 ng/mL  The urine drug screen provides only a preliminary, unconfirmed analytical test result and should not be used for non-medical purposes. Clinical consideration and professional judgment should be applied to any positive drug screen result due to possible interfering substances. A more specific alternate chemical method must be used in order to obtain a confirmed analytical result. Gas chromatography / mass spectrometry (GC/MS) is the preferred confirm atory method. Performed at [facility], [address] [place], [place] [address]     Current Facility-Administered Medications  Medication Dose Route Frequency Provider Last Rate Last Admin   amLODipine (NORVASC) tablet 10 mg  10 mg Oral Daily Delman Kitten, MD       atorvastatin (LIPITOR) tablet 80 mg  80 mg Oral QPM Delman Kitten, MD       citalopram (CELEXA) tablet 10 mg  10 mg Oral Daily Delman Kitten, MD       ibuprofen (ADVIL) tablet 600 mg  600 mg Oral Once Delman Kitten, MD       pantoprazole  (PROTONIX) EC tablet 40 mg  40 mg Oral BID Delman Kitten, MD       QUEtiapine (SEROQUEL) tablet 100 mg  100 mg Oral QHS Patrecia Pour, NP       Current Outpatient Medications  Medication Sig Dispense Refill   amLODipine (NORVASC) 10 MG tablet Take 1 tablet (10 mg total) by mouth daily. 30 tablet 1   atorvastatin (LIPITOR) 80 MG tablet Take 1 tablet (80 mg total) by mouth every evening. 30 tablet 1   benzonatate (TESSALON PERLES) 100 MG capsule Take 1 capsule (100 mg total) by mouth 3 (three) times daily as needed for cough. 30 capsule 0   brompheniramine-pseudoephedrine-DM 30-2-10 MG/5ML syrup Take 10 mLs by mouth 4 (four) times daily as needed. 200 mL 0   citalopram (CELEXA) 10 MG tablet Take 1 tablet (10 mg total) by mouth daily. 30 tablet 1   fluticasone (FLONASE) 50 MCG/ACT nasal spray Place 1 spray into both nostrils 2 (two) times daily. 16 g 0   ibuprofen (ADVIL) 800 MG tablet Take 800 mg by mouth every 8 (eight) hours as needed for pain.     ketorolac (TORADOL) 10 MG tablet Take 1 tablet (10 mg total) by mouth every 6 (six) hours as needed. (Patient not taking: Reported on 01/14/2022) 20 tablet 0   lisinopril (ZESTRIL) 40 MG tablet Take 1 tablet (40 mg total) by mouth daily. 30 tablet 1   pantoprazole (PROTONIX) 40 MG tablet Take 1 tablet (40 mg total) by mouth 2 (two) times daily. 60 tablet 1   QUEtiapine (SEROQUEL) 100 MG tablet Take 1 tablet (100 mg total)  by mouth at bedtime. 100 tablet 1   traMADol (ULTRAM) 50 MG tablet Take 50 mg by mouth every 6 (six) hours as needed for pain.      Musculoskeletal: Strength & Muscle Tone: within normal limits Gait & Station: normal Patient leans: N/A  Psychiatric Specialty Exam: Physical Exam Vitals and nursing note reviewed.  Constitutional:      Appearance: Normal appearance.  HENT:     Head: Normocephalic.     Nose: Nose normal.  Pulmonary:     Effort: Pulmonary effort is normal.  Musculoskeletal:        General: Normal range of  motion.     Cervical back: Normal range of motion.  Neurological:     General: No focal deficit present.     Mental Status: He is alert and oriented to person, place, and time.  Psychiatric:        Attention and Perception: Attention and perception normal.        Mood and Affect: Mood is depressed.        Speech: Speech normal.        Behavior: Behavior normal. Behavior is cooperative.        Thought Content: Thought content includes suicidal ideation.        Cognition and Memory: Cognition and memory normal.        Judgment: Judgment normal.     Review of Systems  Psychiatric/Behavioral:  Positive for depression and substance abuse.   All other systems reviewed and are negative.   Blood pressure (!) 141/108, pulse 85, temperature 98.2 F (36.8 C), temperature source Oral, resp. rate 20, height '6\' 1"'$  (1.854 m), weight 106.6 kg, SpO2 95 %.Body mass index is 31 kg/m.  General Appearance: Casual  Eye Contact:  Good  Speech:  Normal Rate  Volume:  Normal  Mood:  Depressed  Affect:  Blunt  Thought Process:  Coherent and Descriptions of Associations: Intact  Orientation:  Full (Time, Place, and Person)  Thought Content:  WDL and Logical  Suicidal Thoughts:  No  Homicidal Thoughts:  yes, no intent or plan  Memory:  Immediate;   Fair Recent;   Fair Remote;   Fair  Judgement:  Fair  Insight:  Fair  Psychomotor Activity:  Normal  Concentration:  Concentration: Fair and Attention Span: Fair  Recall:  Good  Fund of Knowledge:  Good  Language:  Good  Akathisia:  No  Handed:  Right  AIMS (if indicated):     Assets:  Housing Leisure Time Physical Health Resilience Social Support  ADL's:  Intact  Cognition:  WNL  Sleep:       Physical Exam: Physical Exam Vitals and nursing note reviewed.  Constitutional:      Appearance: Normal appearance.  HENT:     Head: Normocephalic.     Nose: Nose normal.  Pulmonary:     Effort: Pulmonary effort is normal.  Musculoskeletal:         General: Normal range of motion.     Cervical back: Normal range of motion.  Neurological:     General: No focal deficit present.     Mental Status: He is alert and oriented to person, place, and time.  Psychiatric:        Attention and Perception: Attention and perception normal.        Mood and Affect: Mood is depressed.        Speech: Speech normal.        Behavior: Behavior normal.  Behavior is cooperative.        Thought Content: Thought content includes suicidal ideation.        Cognition and Memory: Cognition and memory normal.        Judgment: Judgment normal.    Review of Systems  Psychiatric/Behavioral:  Positive for depression and substance abuse.   All other systems reviewed and are negative.  Blood pressure (!) 141/108, pulse 85, temperature 98.2 F (36.8 C), temperature source Oral, resp. rate 20, height '6\' 1"'$  (1.854 m), weight 106.6 kg, SpO2 95 %. Body mass index is 31 kg/m.  Treatment Plan Summary: Daily contact with patient to assess and evaluate symptoms and progress in treatment, Medication management, and Plan : Cocaine induced mood disorder: Celexa 10 mg daily Seroquel 100 mg at bedtime   Disposition:  Reassess in the AM for dispo Waylan Boga, NP 01/14/2022 4:40 PM

## 2022-01-14 NOTE — ED Triage Notes (Signed)
Patient c/o suicidal thoughts with no plan.  Patient reports needing to detox from heroin and coke. Last used last night per patient.   Tested positive for covid on Monday 01/04/22

## 2022-01-15 MED ORDER — QUETIAPINE FUMARATE 100 MG PO TABS: 100.0000 mg | ORAL_TABLET | Freq: Every day | ORAL | 0 refills | Status: AC

## 2022-01-15 MED ORDER — CITALOPRAM HYDROBROMIDE 10 MG PO TABS: 10.0000 mg | ORAL_TABLET | Freq: Every day | ORAL | 0 refills | Status: AC

## 2022-01-15 NOTE — ED Notes (Signed)
VOL/pending reassessment in the AM 

## 2022-01-15 NOTE — ED Notes (Signed)
E-signature not working at this time. Pt verbalized understanding of D/C instructions, prescriptions and follow up care with no further questions at this time. Pt in NAD and ambulatory at time of D/C. Belongings bag 2/2 returned to patient at time of departure.

## 2022-01-15 NOTE — ED Notes (Signed)
Pt given breakfast tray at this time. 

## 2022-01-15 NOTE — Consult Note (Signed)
[facility] Consult   Reason for Consult:  cocaine abuse with homicidal ideations Referring Physician:  EDP Patient Identification: Samuel Molina MRN:  [id number] Principal Diagnosis: Cocaine abuse with cocaine-induced mood disorder ([place]) Diagnosis:  Principal Problem:   Cocaine abuse with cocaine-induced mood disorder ([place])   Total Time spent with patient: 45 minutes  Subjective:   Samuel Molina is a 61 y.o. male patient admitted with homicidal ideations after using cocaine and other substances.  Today, he is clear and coherent with no threats to self or others.  Discussed resources for [facility] IP for substance abuse and mental health treatment.  He tolerated the Celexa and Seroquel, Rx sent to his pharmacy.  No suicidal/homicidal ideations, hallucinations, or withdrawal symptoms.  [facility] resources provided.  HPI:   61 y/o male here for thoughts of harming his sister after using cocaine/heroin today; states that he fears he is going to do something to his little sister and this thought bothers him as he feels very close to her; denies having any plan to do so and denies suicidal ideation; states that he has used heroin and cocaine in the past; denies ETOH use; denies hallucinations; pt laying in bed, eating popsicles and eating meal tray, watching TV, in no distress; speaking in complete sentences, answering questions appropriately; A/O x 4; calm, medications restarted and will re-assess in the AM  Past Psychiatric History: cocaine abuse, depression, anxiety  Risk to Self:  none Risk to Others:  none Prior Inpatient Therapy:  multiple Prior Outpatient Therapy:  [facility]  Past Medical History:  Past Medical History:  Diagnosis Date   Anxiety    Depression    GERD (gastroesophageal reflux disease)    Obesity    Stroke ([place])    Substance abuse ([place])     Past Surgical History:  Procedure Laterality Date   COLONOSCOPY WITH PROPOFOL N/A 11/23/2021   Procedure: COLONOSCOPY  WITH PROPOFOL;  Surgeon: [name], MD;  Location: [facility] ENDOSCOPY;  Service: Gastroenterology;  Laterality: N/A;   ESOPHAGOGASTRODUODENOSCOPY N/A 11/23/2021   Procedure: ESOPHAGOGASTRODUODENOSCOPY (EGD);  Surgeon: [name], MD;  Location: [facility] ENDOSCOPY;  Service: Gastroenterology;  Laterality: N/A;   ESOPHAGOGASTRODUODENOSCOPY (EGD) WITH PROPOFOL N/A 09/20/2017   Procedure: ESOPHAGOGASTRODUODENOSCOPY (EGD) WITH PROPOFOL;  Surgeon: Lin Landsman, MD;  Location: [facility] ENDOSCOPY;  Service: Gastroenterology;  Laterality: N/A;   KNEE SURGERY Right    TEE WITHOUT CARDIOVERSION N/A 11/15/2021   Procedure: TRANSESOPHAGEAL ECHOCARDIOGRAM (TEE);  Surgeon: Minna Merritts, MD;  Location: [facility] ORS;  Service: Cardiovascular;  Laterality: N/A;   Family History:  Family History  Problem Relation Age of Onset   Vasculitis Mother    Family Psychiatric  History: none Social History:  Social History   Substance and Sexual Activity  Alcohol Use Not Currently     Social History   Substance and Sexual Activity  Drug Use Yes   Types: Cocaine   Comment: heroin    Social History   Socioeconomic History   Marital status: Single    Spouse name: Not on file   Number of children: Not on file   Years of education: Not on file   Highest education level: Not on file  Occupational History   Not on file  Tobacco Use   Smoking status: Every Day    Packs/day: 0.50    Types: Cigarettes   Smokeless tobacco: Never  Vaping Use   Vaping Use: Never used  Substance and Sexual Activity   Alcohol use: Not Currently  Drug use: Yes    Types: Cocaine    Comment: heroin   Sexual activity: Not Currently  Other Topics Concern   Not on file  Social History Narrative   Not on file   Social Determinants of Health   Financial Resource Strain: Not on file  Food Insecurity: Not on file  Transportation Needs: Not on file  Physical Activity: Not on file  Stress: Not on file  Social Connections: Not on  file   Additional Social History:    Allergies:   Allergies  Allergen Reactions   Acetaminophen Itching, Nausea And Vomiting and Rash    Labs:  Results for orders placed or performed during the hospital encounter of 01/14/22 (from the past 48 hour(s))  Comprehensive metabolic panel     Status: Abnormal   Collection Time: 01/14/22  3:10 PM  Result Value Ref Range   Sodium 140 135 - 145 mmol/L   Potassium 3.4 (L) 3.5 - 5.1 mmol/L   Chloride 109 98 - 111 mmol/L   CO2 21 (L) 22 - 32 mmol/L   Glucose, Bld 127 (H) 70 - 99 mg/dL    Comment: Glucose reference range applies only to samples taken after fasting for at least 8 hours.   BUN 14 6 - 20 mg/dL   Creatinine, Ser 0.97 0.61 - 1.24 mg/dL   Calcium 9.3 8.9 - 10.3 mg/dL   Total Protein 8.1 6.5 - 8.1 g/dL   Albumin 4.3 3.5 - 5.0 g/dL   AST 33 15 - 41 U/L   ALT 18 0 - 44 U/L   Alkaline Phosphatase 99 38 - 126 U/L   Total Bilirubin 0.7 0.3 - 1.2 mg/dL   GFR, Estimated >60 >60 mL/min    Comment: (NOTE) Calculated using the CKD-EPI Creatinine Equation (2021)    Anion gap 10 5 - 15    Comment: Performed at [facility], [address] [place], [place] [address]  Ethanol     Status: None   Collection Time: 01/14/22  3:10 PM  Result Value Ref Range   Alcohol, Ethyl (B) <10 <10 mg/dL    Comment: (NOTE) Lowest detectable limit for serum alcohol is 10 mg/dL.  For medical purposes only. Performed at [facility], [address] [place], [place] [address]   cbc     Status: Abnormal   Collection Time: 01/14/22  3:10 PM  Result Value Ref Range   WBC 8.4 4.0 - 10.5 K/uL   RBC 5.55 4.22 - 5.81 MIL/uL   Hemoglobin 14.0 13.0 - 17.0 g/dL   HCT 45.1 39.0 - 52.0 %   MCV 81.3 80.0 - 100.0 fL   MCH 25.2 (L) 26.0 - 34.0 pg   MCHC 31.0 30.0 - 36.0 g/dL   RDW 14.6 11.5 - 15.5 %   Platelets 321 150 - 400 K/uL   nRBC 0.0 0.0 - 0.2 %    Comment: Performed at [facility], [address], [place], [place]  [address]  Urine Drug Screen, Qualitative     Status: Abnormal   Collection Time: 01/14/22  3:10 PM  Result Value Ref Range   Tricyclic, Ur Screen NONE DETECTED NONE DETECTED   Amphetamines, Ur Screen NONE DETECTED NONE DETECTED   MDMA (Ecstasy)Ur Screen NONE DETECTED NONE DETECTED   Cocaine Metabolite,Ur [place] POSITIVE (A) NONE DETECTED   Opiate, Ur Screen NONE DETECTED NONE DETECTED   Phencyclidine (PCP) Ur S NONE DETECTED NONE DETECTED   Cannabinoid 50 Ng, Ur [place] NONE  DETECTED NONE DETECTED   Barbiturates, Ur Screen NONE DETECTED NONE DETECTED   Benzodiazepine, Ur Scrn NONE DETECTED NONE DETECTED   Methadone Scn, Ur NONE DETECTED NONE DETECTED    Comment: (NOTE) Tricyclics + metabolites, urine    Cutoff 1000 ng/mL Amphetamines + metabolites, urine  Cutoff 1000 ng/mL MDMA (Ecstasy), urine              Cutoff 500 ng/mL Cocaine Metabolite, urine          Cutoff 300 ng/mL Opiate + metabolites, urine        Cutoff 300 ng/mL Phencyclidine (PCP), urine         Cutoff 25 ng/mL Cannabinoid, urine                 Cutoff 50 ng/mL Barbiturates + metabolites, urine  Cutoff 200 ng/mL Benzodiazepine, urine              Cutoff 200 ng/mL Methadone, urine                   Cutoff 300 ng/mL  The urine drug screen provides only a preliminary, unconfirmed analytical test result and should not be used for non-medical purposes. Clinical consideration and professional judgment should be applied to any positive drug screen result due to possible interfering substances. A more specific alternate chemical method must be used in order to obtain a confirmed analytical result. Gas chromatography / mass spectrometry (GC/MS) is the preferred confirm atory method. Performed at [facility], [address] [place], [place] [address]     Current Facility-Administered Medications  Medication Dose Route Frequency Provider Last Rate Last Admin   amLODipine (NORVASC) tablet 10 mg  10 mg Oral Daily Delman Kitten, MD   10 mg at 01/14/22 1736   atorvastatin (LIPITOR) tablet 80 mg  80 mg Oral QPM Delman Kitten, MD   80 mg at 01/14/22 1736   citalopram (CELEXA) tablet 10 mg  10 mg Oral Daily Delman Kitten, MD   10 mg at 01/14/22 1736   pantoprazole (PROTONIX) EC tablet 40 mg  40 mg Oral BID Delman Kitten, MD   40 mg at 01/14/22 2112   QUEtiapine (SEROQUEL) tablet 100 mg  100 mg Oral QHS Patrecia Pour, NP   100 mg at 01/14/22 2112   Current Outpatient Medications  Medication Sig Dispense Refill   amLODipine (NORVASC) 10 MG tablet Take 1 tablet (10 mg total) by mouth daily. 30 tablet 1   atorvastatin (LIPITOR) 80 MG tablet Take 1 tablet (80 mg total) by mouth every evening. 30 tablet 1   citalopram (CELEXA) 10 MG tablet Take 1 tablet (10 mg total) by mouth daily. 30 tablet 1   lisinopril (ZESTRIL) 40 MG tablet Take 1 tablet (40 mg total) by mouth daily. 30 tablet 1   QUEtiapine (SEROQUEL) 100 MG tablet Take 1 tablet (100 mg total) by mouth at bedtime. 100 tablet 1   benzonatate (TESSALON PERLES) 100 MG capsule Take 1 capsule (100 mg total) by mouth 3 (three) times daily as needed for cough. 30 capsule 0   brompheniramine-pseudoephedrine-DM 30-2-10 MG/5ML syrup Take 10 mLs by mouth 4 (four) times daily as needed. 200 mL 0   fluticasone (FLONASE) 50 MCG/ACT nasal spray Place 1 spray into both nostrils 2 (two) times daily. 16 g 0   ibuprofen (ADVIL) 800 MG tablet Take 800 mg by mouth every 8 (eight) hours as needed for pain.     ketorolac (TORADOL) 10 MG tablet  Take 1 tablet (10 mg total) by mouth every 6 (six) hours as needed. (Patient not taking: Reported on 01/14/2022) 20 tablet 0   pantoprazole (PROTONIX) 40 MG tablet Take 1 tablet (40 mg total) by mouth 2 (two) times daily. 60 tablet 1   traMADol (ULTRAM) 50 MG tablet Take 50 mg by mouth every 6 (six) hours as needed for pain.      Musculoskeletal: Strength & Muscle Tone: within normal limits Gait & Station: normal Patient leans: N/A  Psychiatric  Specialty Exam: Physical Exam Vitals and nursing note reviewed.  Constitutional:      Appearance: Normal appearance.  HENT:     Head: Normocephalic.     Nose: Nose normal.  Pulmonary:     Effort: Pulmonary effort is normal.  Musculoskeletal:        General: Normal range of motion.     Cervical back: Normal range of motion.  Neurological:     General: No focal deficit present.     Mental Status: He is alert and oriented to person, place, and time.  Psychiatric:        Attention and Perception: Attention and perception normal.        Mood and Affect: Mood is depressed.        Speech: Speech normal.        Behavior: Behavior normal. Behavior is cooperative.        Thought Content: Thought content includes suicidal ideation.        Cognition and Memory: Cognition and memory normal.        Judgment: Judgment normal.     Review of Systems  Psychiatric/Behavioral:  Positive for depression and substance abuse.   All other systems reviewed and are negative.   Blood pressure (!) 114/59, pulse (!) 54, temperature 98.2 F (36.8 C), resp. rate 19, height '6\' 1"'$  (1.854 m), weight 106.6 kg, SpO2 99 %.Body mass index is 31 kg/m.  General Appearance: Casual  Eye Contact:  Good  Speech:  Normal Rate  Volume:  Normal  Mood:  Depressed, mild  Affect:  Blunt  Thought Process:  Coherent and Descriptions of Associations: Intact  Orientation:  Full (Time, Place, and Person)  Thought Content:  WDL and Logical  Suicidal Thoughts:  No  Homicidal Thoughts:  No  Memory:  Immediate;   Fair Recent;   Fair Remote;   Fair  Judgement:  Fair  Insight:  Fair  Psychomotor Activity:  Normal  Concentration:  Concentration: Fair and Attention Span: Fair  Recall:  Good  Fund of Knowledge:  Good  Language:  Good  Akathisia:  No  Handed:  Right  AIMS (if indicated):     Assets:  Housing Leisure Time Physical Health Resilience Social Support  ADL's:  Intact  Cognition:  WNL  Sleep:   good     Physical Exam: Physical Exam Vitals and nursing note reviewed.  Constitutional:      Appearance: Normal appearance.  HENT:     Head: Normocephalic.     Nose: Nose normal.  Pulmonary:     Effort: Pulmonary effort is normal.  Musculoskeletal:        General: Normal range of motion.     Cervical back: Normal range of motion.  Neurological:     General: No focal deficit present.     Mental Status: He is alert and oriented to person, place, and time.  Psychiatric:        Attention and Perception: Attention and perception normal.  Mood and Affect: Mood is depressed.        Speech: Speech normal.        Behavior: Behavior normal. Behavior is cooperative.        Thought Content: Thought content includes suicidal ideation.        Cognition and Memory: Cognition and memory normal.        Judgment: Judgment normal.    Review of Systems  Psychiatric/Behavioral:  Positive for depression and substance abuse.   All other systems reviewed and are negative.  Blood pressure (!) 114/59, pulse (!) 54, temperature 98.2 F (36.8 C), resp. rate 19, height '6\' 1"'$  (1.854 m), weight 106.6 kg, SpO2 99 %. Body mass index is 31 kg/m.  Treatment Plan Summary: Daily contact with patient to assess and evaluate symptoms and progress in treatment, Medication management, and Plan : Cocaine induced mood disorder: Celexa 10 mg daily Seroquel 100 mg at bedtime Resources for [facility]  Disposition:  Discharge   Waylan Boga, NP 01/15/2022 9:55 AM

## 2022-01-16 ENCOUNTER — Telehealth: Payer: Self-pay

## 2022-01-16 NOTE — Telephone Encounter (Signed)
Per Dr. Georgeann Oppenheim advice-I've attempted to contact patient to inform him that we will need to reschedule his colonoscopy  or have a UDS due to his recent cocaine use noted on his ER visit.  Anesthesia will not sedate if he test positive for Cocaine.  I've been unable to contact patient.  Unable to lvm for him due to voicemail has not been set up.  I will try to contact him via mychart.  Thank you,  Sharyn Lull, CMA

## 2022-01-17 ENCOUNTER — Ambulatory Visit: Admission: RE | Admit: 2022-01-17 | Payer: 59 | Source: Home / Self Care | Admitting: Gastroenterology

## 2022-01-17 ENCOUNTER — Encounter: Admission: RE | Payer: Self-pay | Source: Home / Self Care

## 2022-01-17 SURGERY — COLONOSCOPY WITH PROPOFOL
Anesthesia: General

## 2022-03-22 IMAGING — CR DG LUMBAR SPINE 2-3V
1 series · 3 of 3 positions shown · non-contrast
Comparison: Abdomen 09/04/2017.

CLINICAL DATA: Left hip pain radiating to left knee.

EXAM:
LUMBAR SPINE - 2-3 VIEW

[Series 1: dg lumbar spine 2-3 views · 0.14mm/px · 3 of 3 slices shown]
[im 1/3]
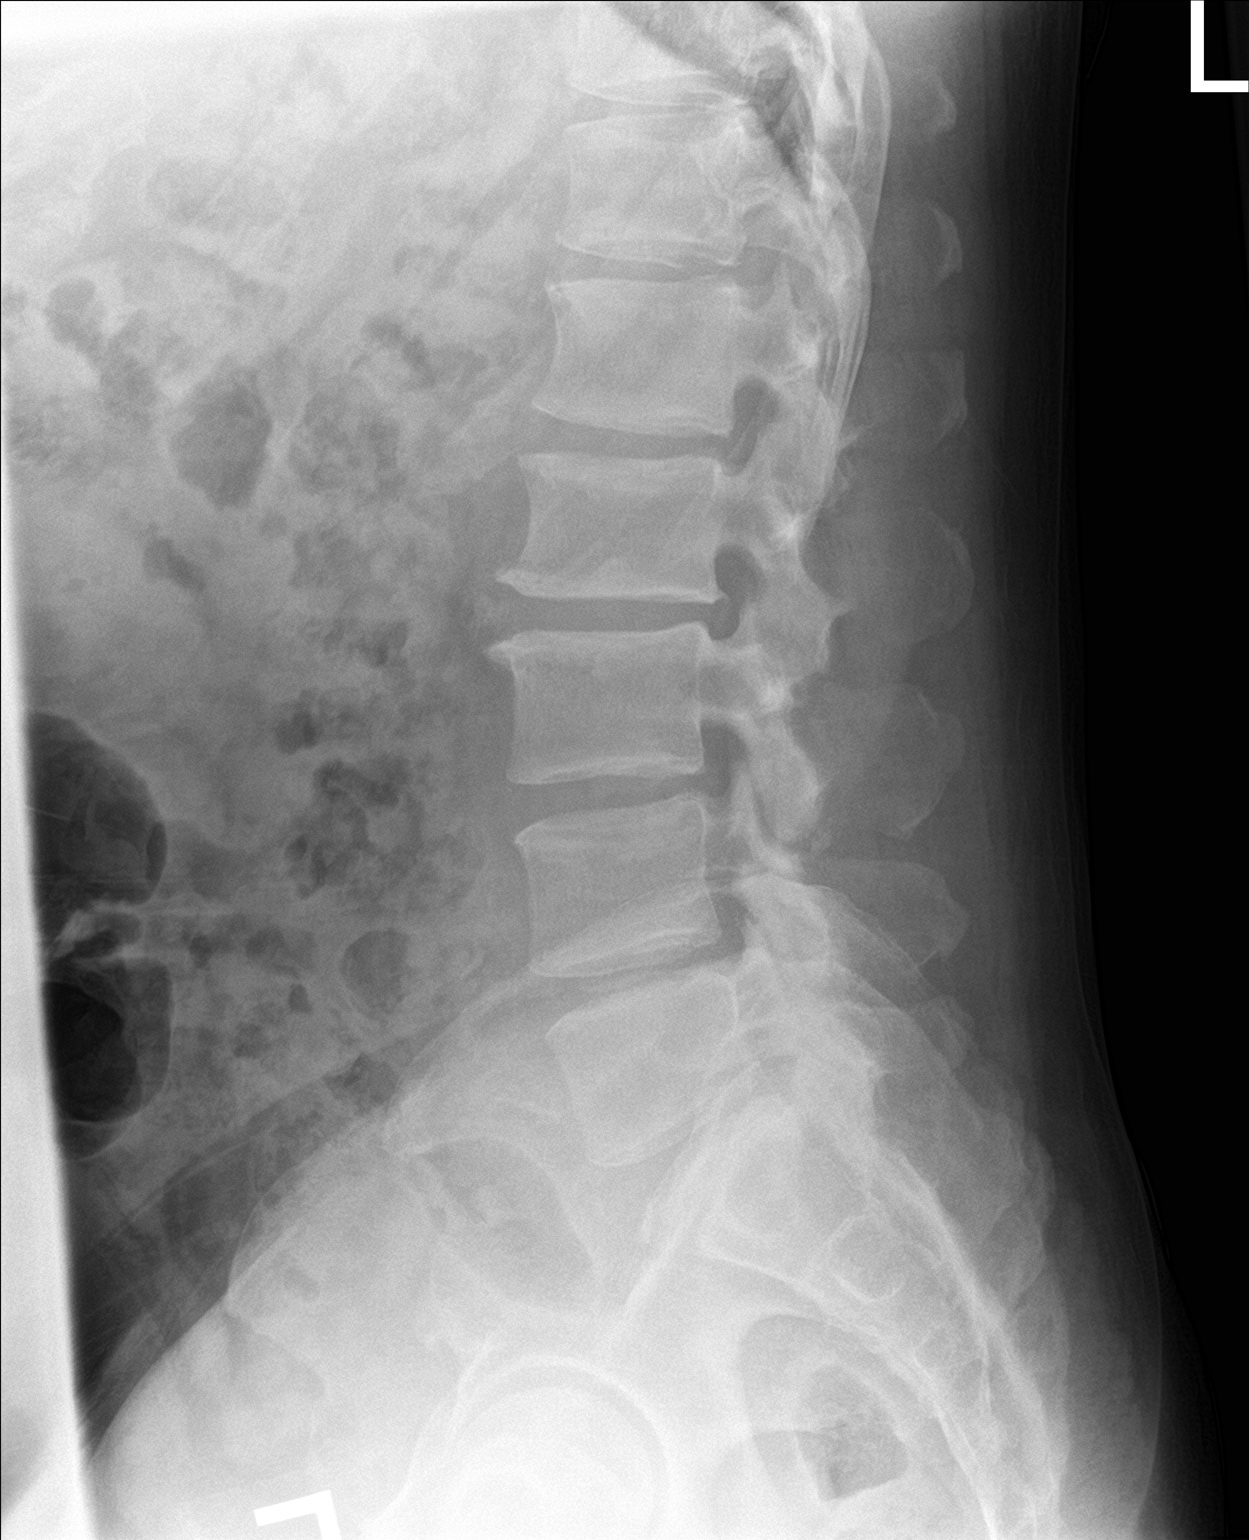
[im 2/3]
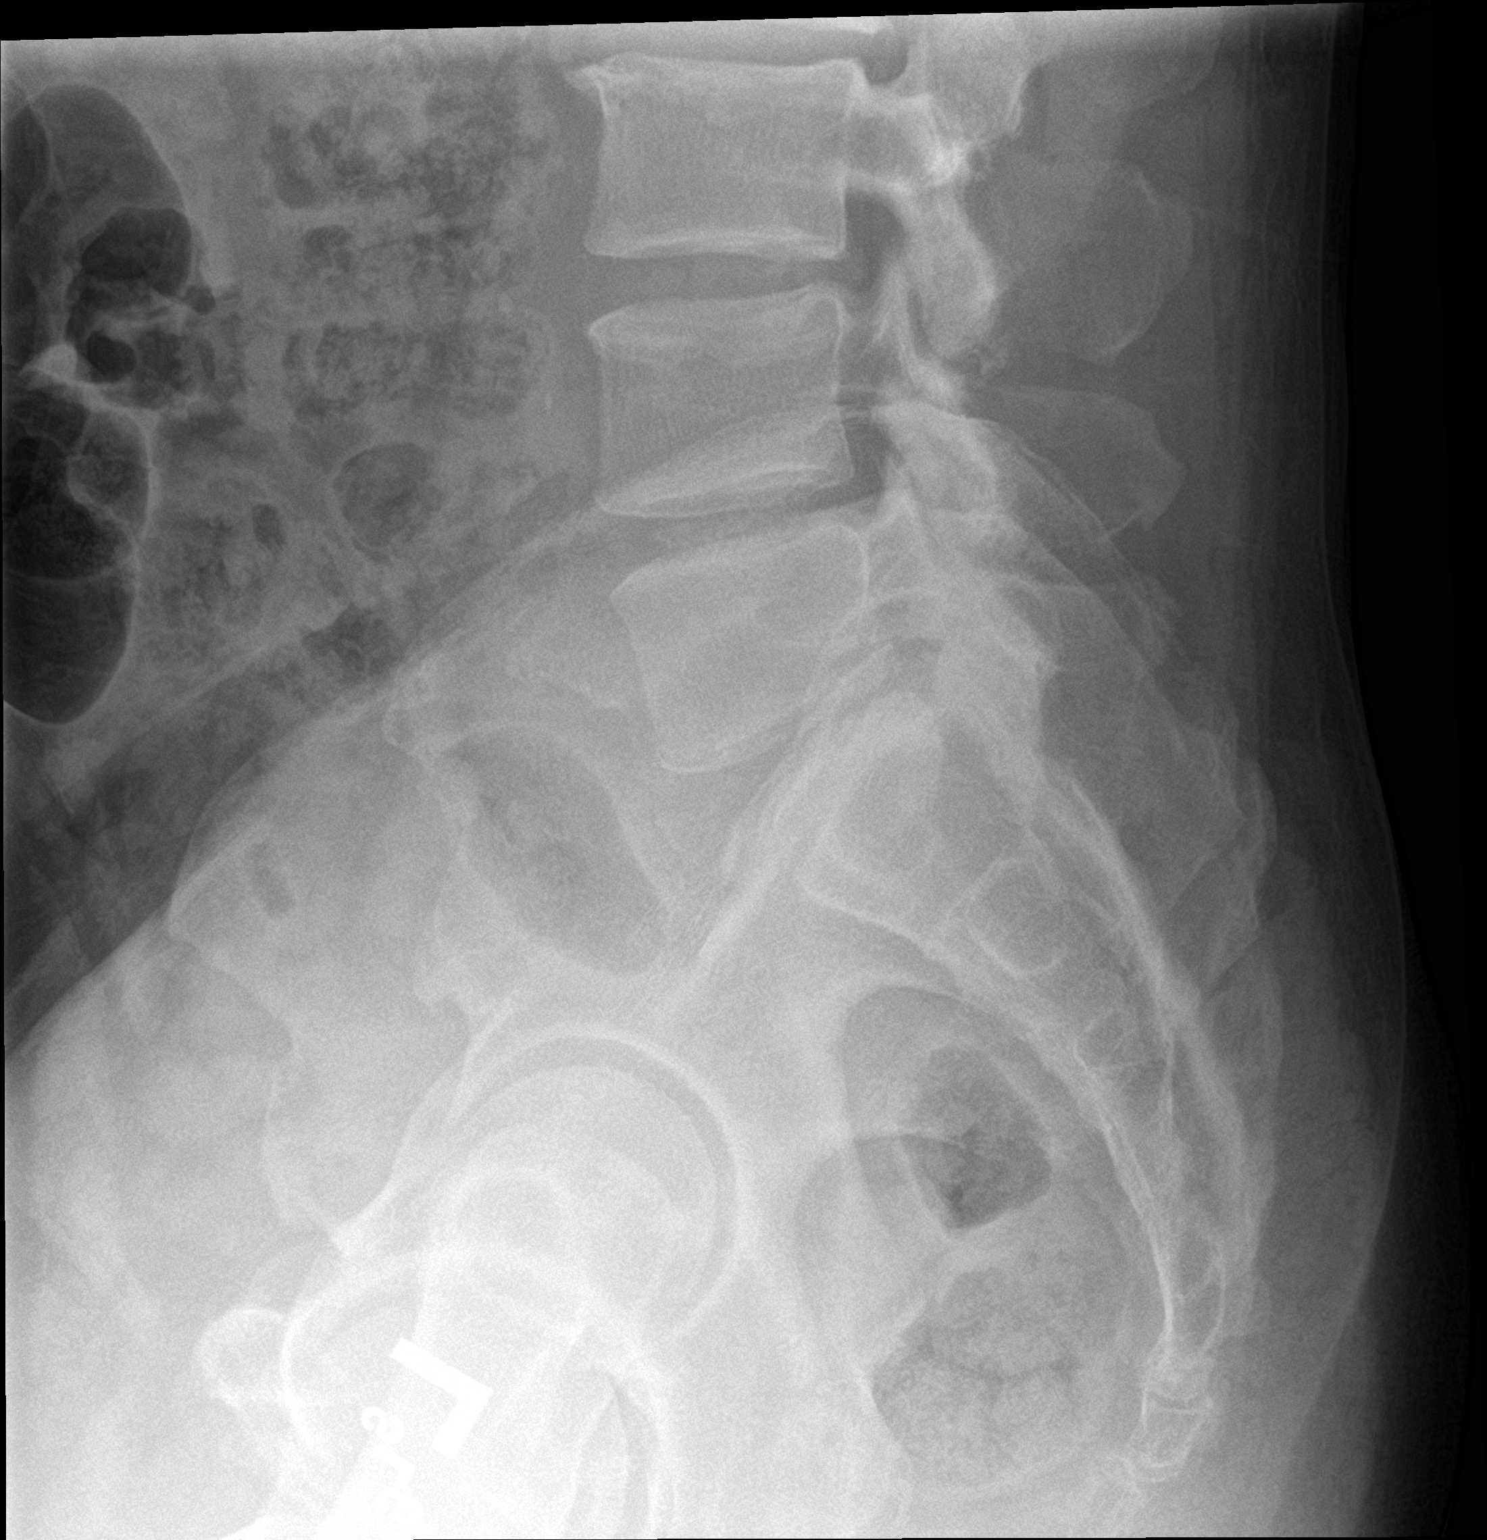
[im 3/3]
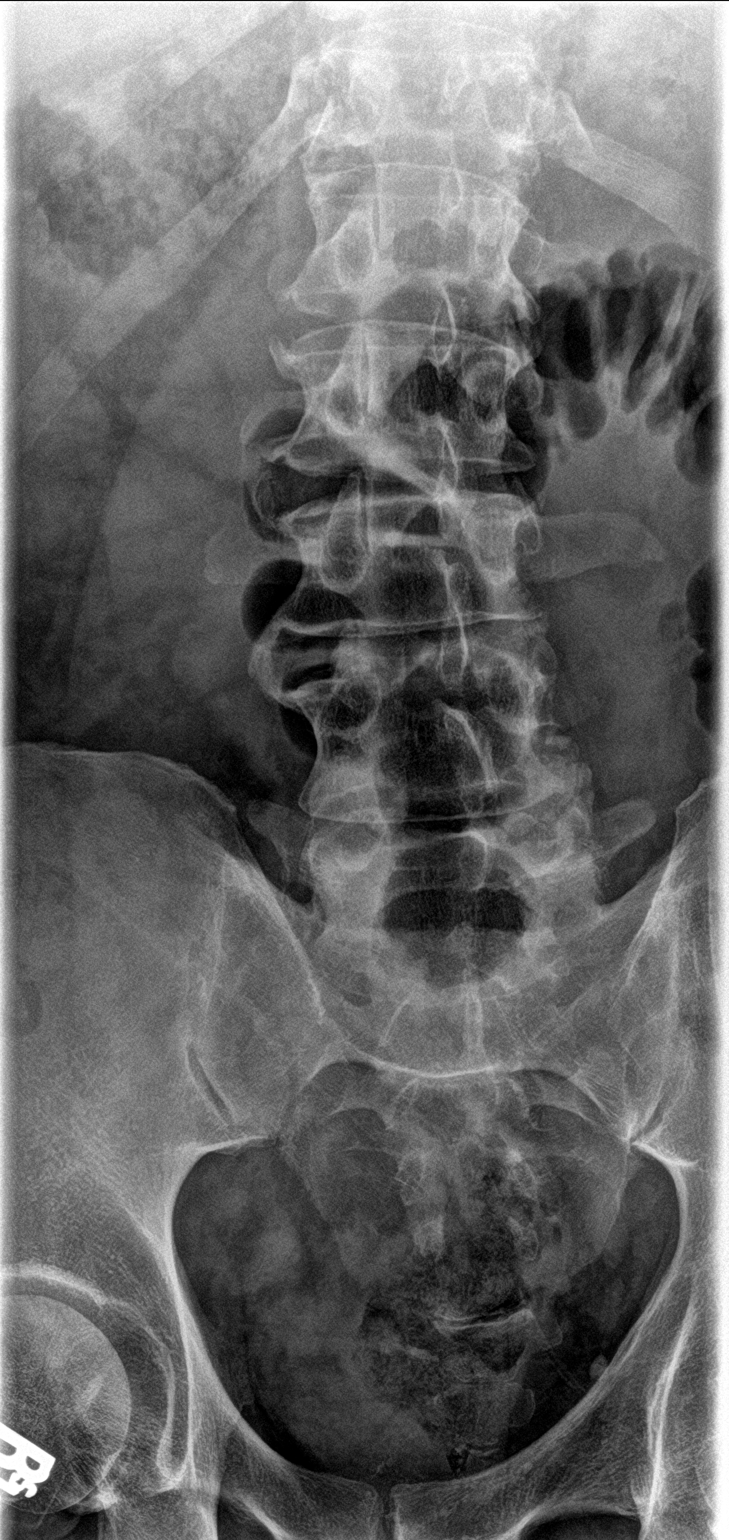

[3 of 3 positions shown; findings below may reference images not displayed]

FINDINGS: Mild scoliosis concave left. Diffuse multilevel degenerative change.
No acute bony abnormality. No evidence of fracture. Pelvic
calcifications consistent phleboliths.
IMPRESSION: Mild scoliosis concave left. Diffuse multilevel degenerative change.
No acute bony abnormality.

## 2022-03-22 IMAGING — CR DG HIP (WITH OR WITHOUT PELVIS) 2-3V*L*
1 series · 3 of 3 positions shown · non-contrast
Comparison: 01/01/2020.

CLINICAL DATA: Left hip pain.

EXAM:
DG HIP (WITH OR WITHOUT PELVIS) 2-3V LEFT

[Series 1: dg hip unilat w or w/o pelvis 2-3 views  · non-contrast · 0.14mm/px · 3 of 3 slices shown]
[im 1/3]
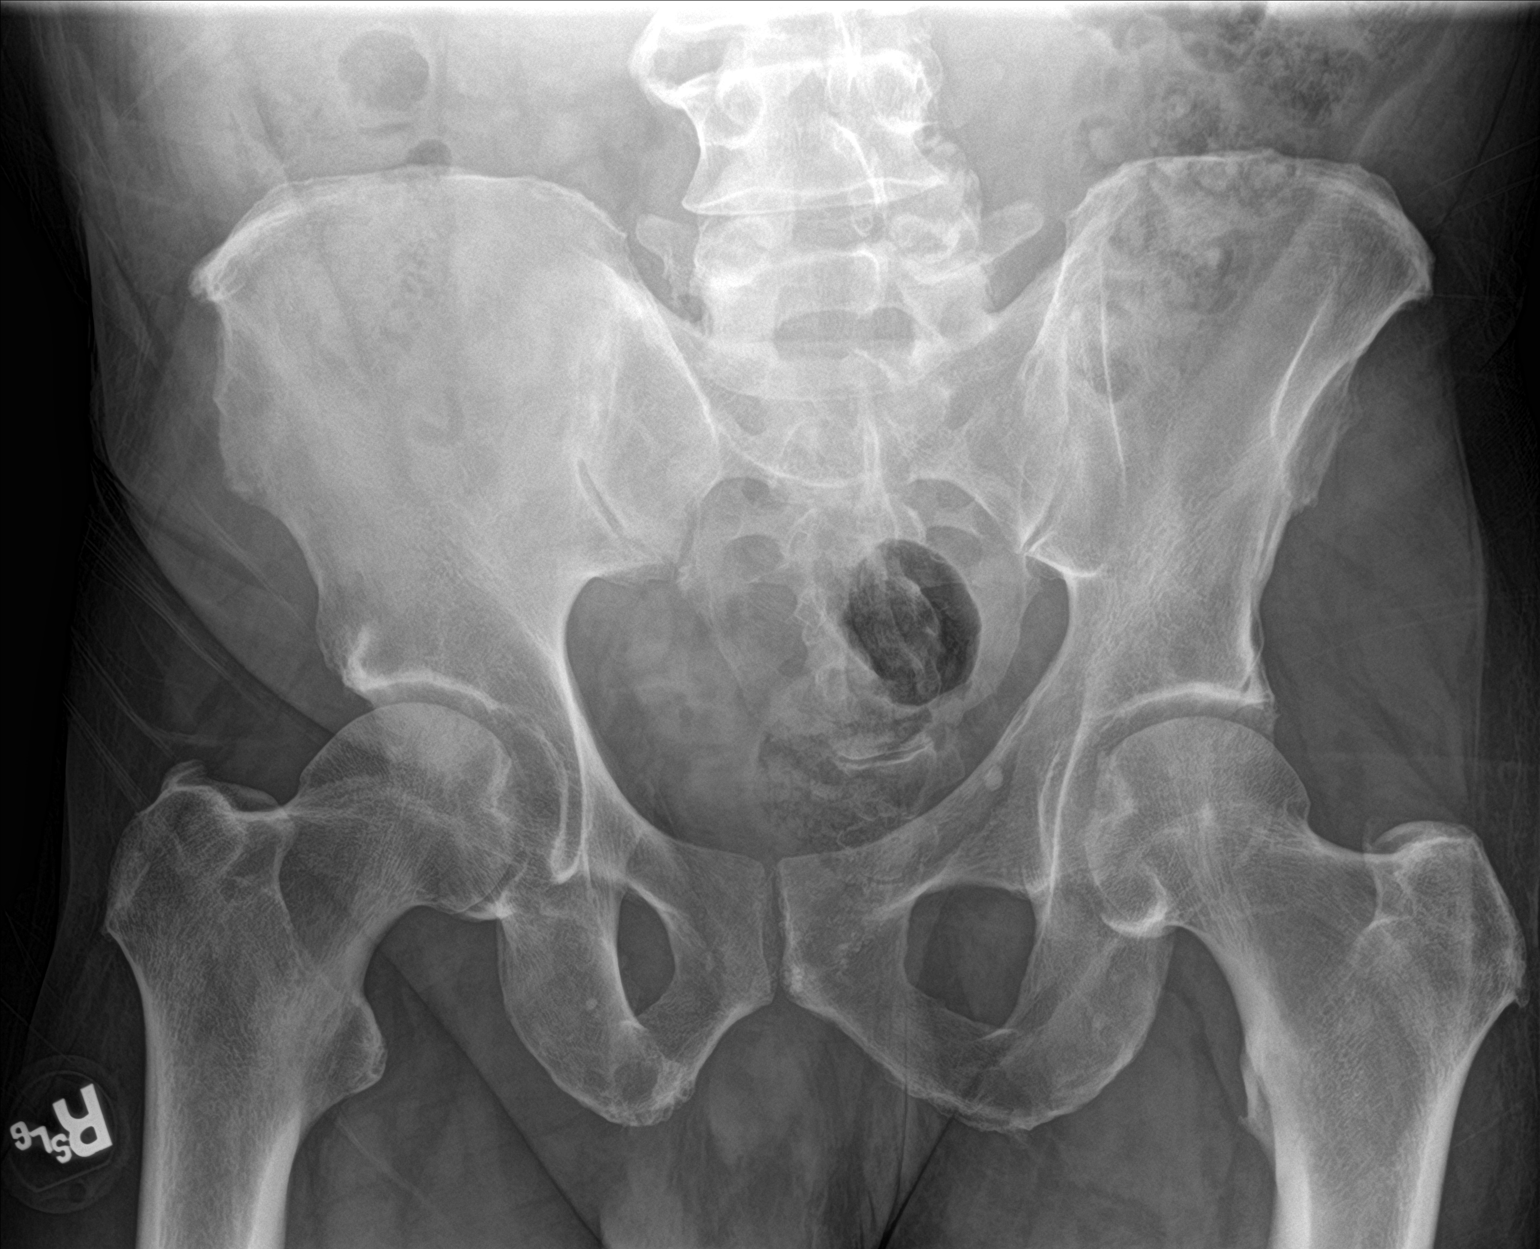
[im 2/3]
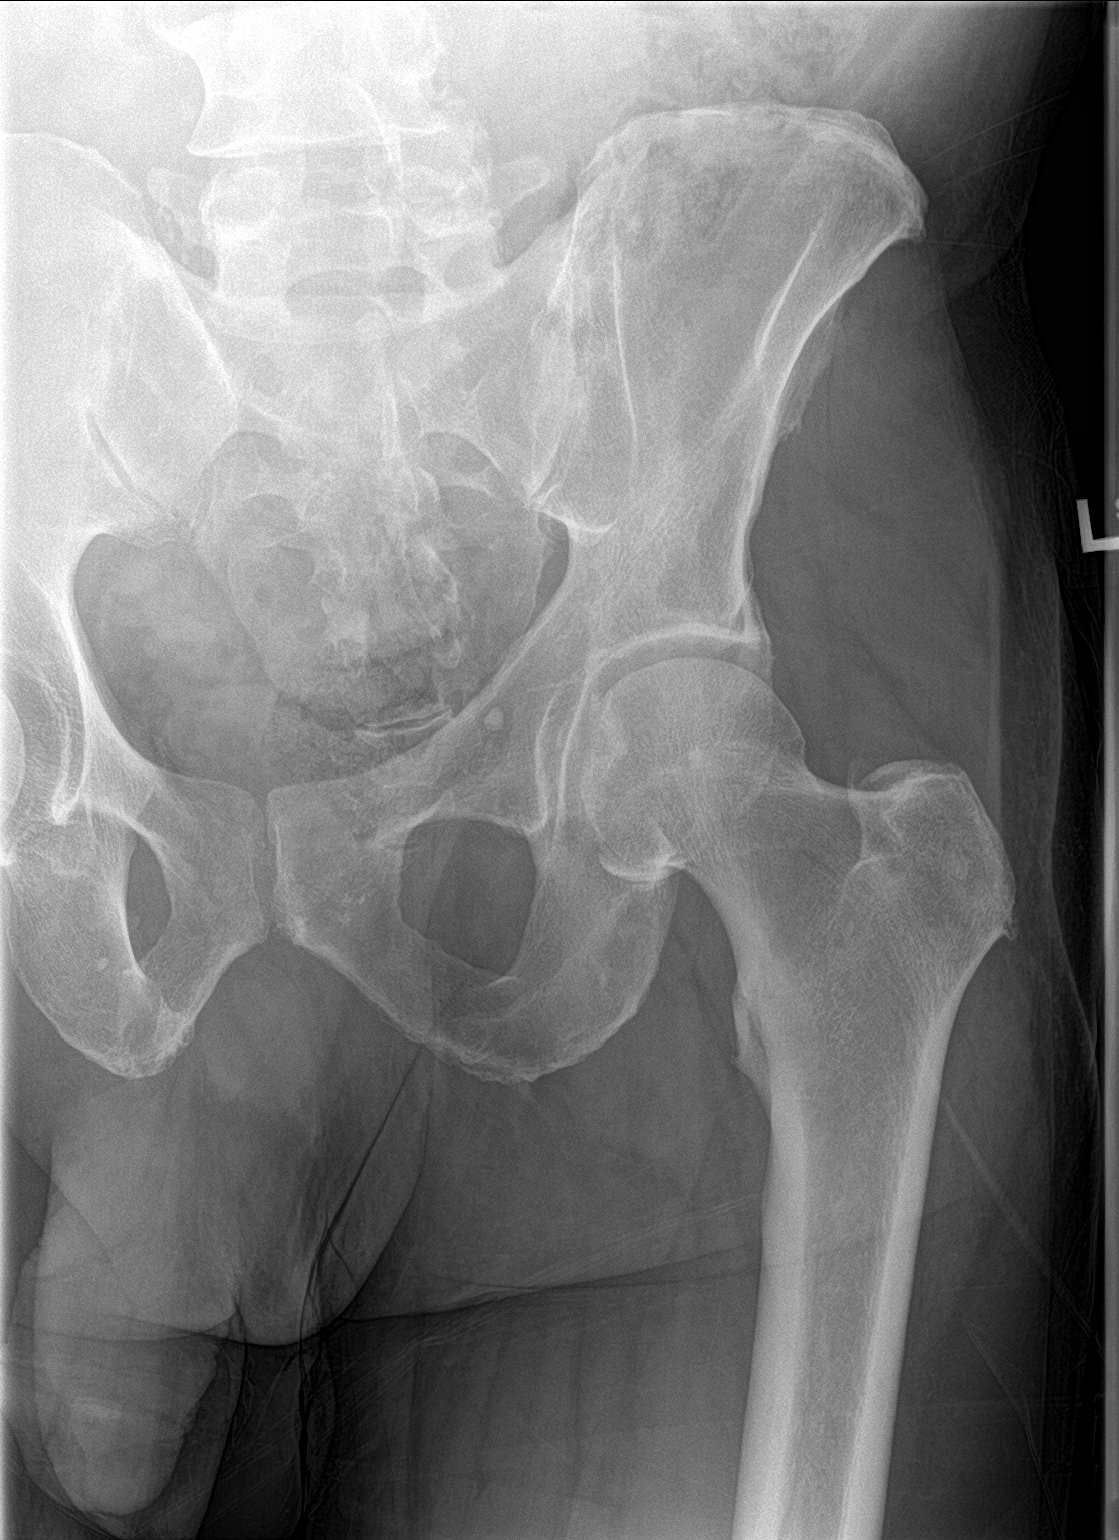
[im 3/3]
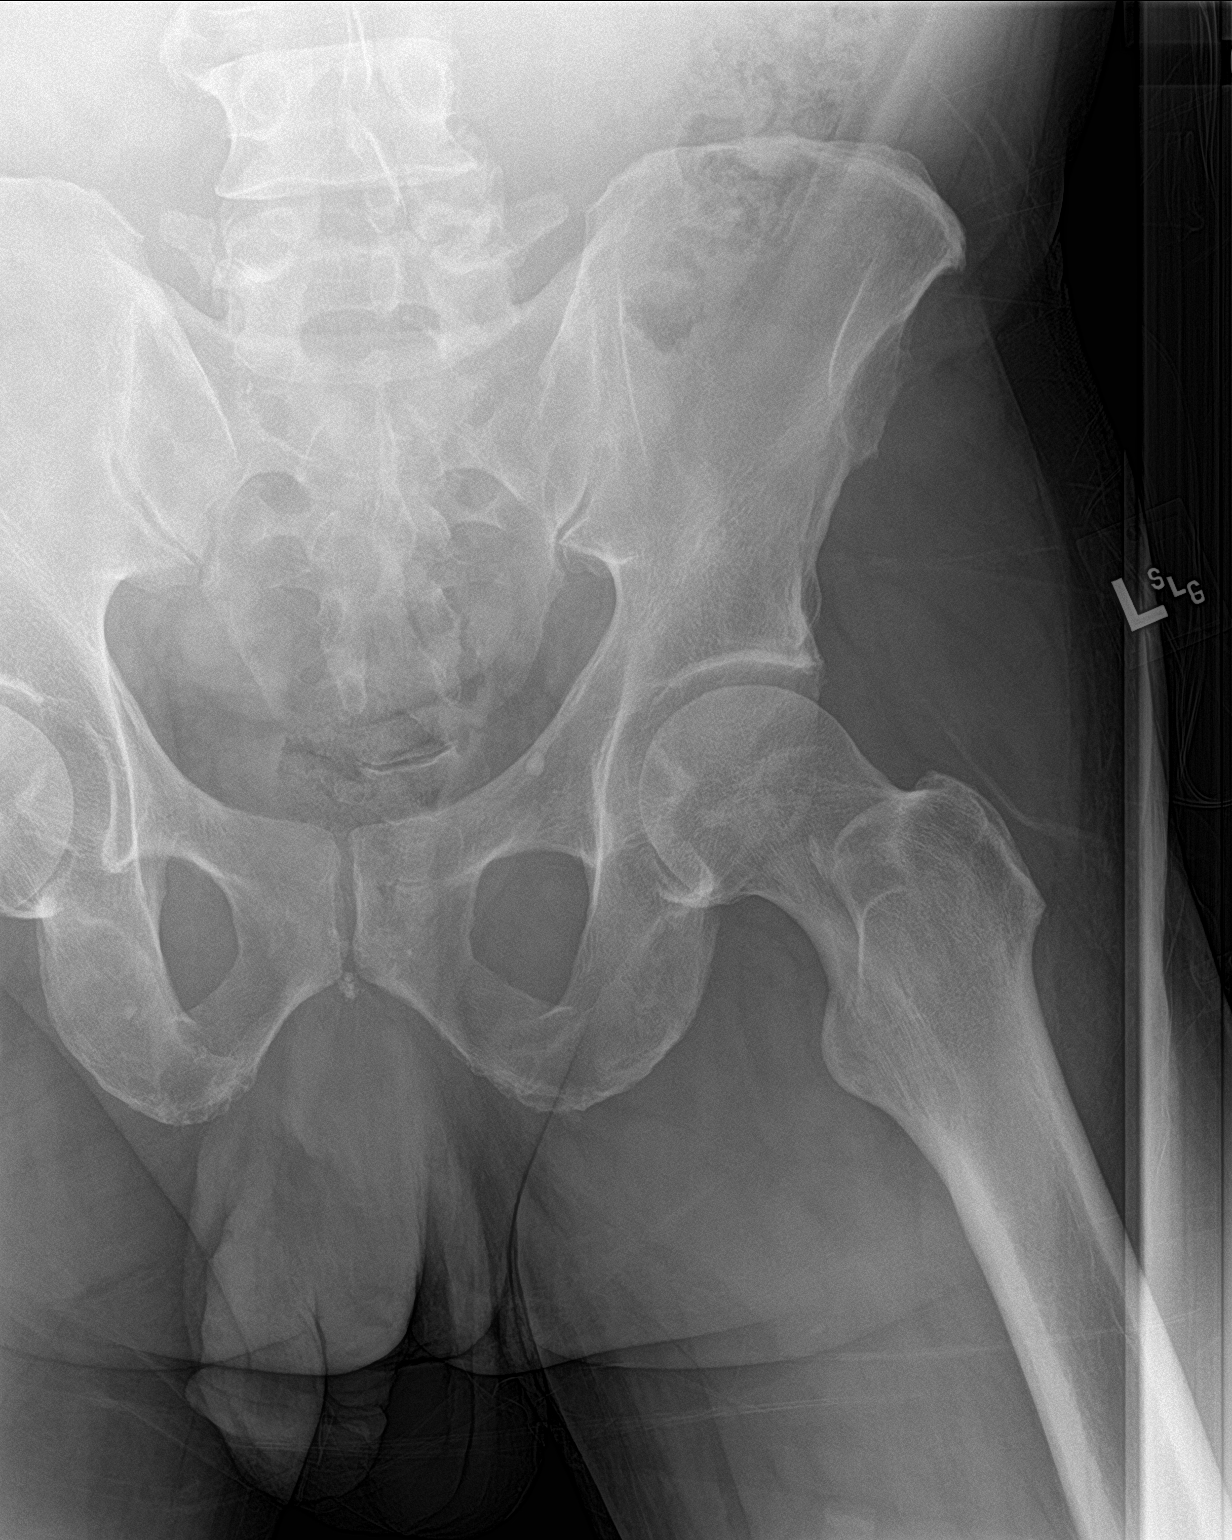

[3 of 3 positions shown; findings below may reference images not displayed]

FINDINGS: Degenerative changes lumbar spine and both hips. No acute bony or
joint abnormality. No evidence of fracture or dislocation. Sclerotic
changes noted the right femoral head. Avascular necrosis cannot be
excluded. Pelvic calcifications consistent phleboliths.
IMPRESSION: 1. Degenerative changes lumbar spine and both hips. No acute
abnormality identified.

2. Sclerotic changes noted the right femoral head. Avascular
necrosis cannot be excluded.

## 2022-05-19 ENCOUNTER — Emergency Department: Payer: BLUE CROSS/BLUE SHIELD

## 2022-05-19 ENCOUNTER — Other Ambulatory Visit: Payer: Self-pay

## 2022-05-19 ENCOUNTER — Observation Stay
Admission: EM | Admit: 2022-05-19 | Discharge: 2022-05-21 | Disposition: A | Discharge status: Home / Self Care | Payer: BLUE CROSS/BLUE SHIELD | Attending: Internal Medicine | Admitting: Internal Medicine

## 2022-05-19 ENCOUNTER — Encounter: Payer: Self-pay | Admitting: Internal Medicine

## 2022-05-19 DIAGNOSIS — F17210 Nicotine dependence, cigarettes, uncomplicated: Secondary | ICD-10-CM | POA: Diagnosis not present

## 2022-05-19 DIAGNOSIS — Z8673 Personal history of transient ischemic attack (TIA), and cerebral infarction without residual deficits: Secondary | ICD-10-CM | POA: Diagnosis not present

## 2022-05-19 DIAGNOSIS — I639 Cerebral infarction, unspecified: Secondary | ICD-10-CM | POA: Diagnosis not present

## 2022-05-19 DIAGNOSIS — T63304A Toxic effect of unspecified spider venom, undetermined, initial encounter: Secondary | ICD-10-CM

## 2022-05-19 DIAGNOSIS — F1414 Cocaine abuse with cocaine-induced mood disorder: Secondary | ICD-10-CM | POA: Diagnosis not present

## 2022-05-19 DIAGNOSIS — Z720 Tobacco use: Secondary | ICD-10-CM | POA: Diagnosis present

## 2022-05-19 DIAGNOSIS — I10 Essential (primary) hypertension: Secondary | ICD-10-CM | POA: Diagnosis not present

## 2022-05-19 DIAGNOSIS — X58XXXA Exposure to other specified factors, initial encounter: Secondary | ICD-10-CM | POA: Insufficient documentation

## 2022-05-19 DIAGNOSIS — F418 Other specified anxiety disorders: Secondary | ICD-10-CM | POA: Diagnosis present

## 2022-05-19 DIAGNOSIS — F609 Personality disorder, unspecified: Secondary | ICD-10-CM | POA: Diagnosis present

## 2022-05-19 DIAGNOSIS — D72829 Elevated white blood cell count, unspecified: Secondary | ICD-10-CM | POA: Diagnosis not present

## 2022-05-19 DIAGNOSIS — S80861A Insect bite (nonvenomous), right lower leg, initial encounter: Secondary | ICD-10-CM | POA: Insufficient documentation

## 2022-05-19 DIAGNOSIS — F449 Dissociative and conversion disorder, unspecified: Secondary | ICD-10-CM

## 2022-05-19 DIAGNOSIS — G459 Transient cerebral ischemic attack, unspecified: Secondary | ICD-10-CM | POA: Diagnosis not present

## 2022-05-19 DIAGNOSIS — F444 Conversion disorder with motor symptom or deficit: Secondary | ICD-10-CM

## 2022-05-19 DIAGNOSIS — Z79899 Other long term (current) drug therapy: Secondary | ICD-10-CM | POA: Insufficient documentation

## 2022-05-19 DIAGNOSIS — R531 Weakness: Secondary | ICD-10-CM

## 2022-05-19 DIAGNOSIS — E669 Obesity, unspecified: Secondary | ICD-10-CM | POA: Diagnosis present

## 2022-05-19 DIAGNOSIS — F1410 Cocaine abuse, uncomplicated: Secondary | ICD-10-CM | POA: Diagnosis present

## 2022-05-19 DIAGNOSIS — E785 Hyperlipidemia, unspecified: Secondary | ICD-10-CM | POA: Diagnosis present

## 2022-05-19 DIAGNOSIS — T63301A Toxic effect of unspecified spider venom, accidental (unintentional), initial encounter: Secondary | ICD-10-CM | POA: Diagnosis present

## 2022-05-19 LAB — DIFFERENTIAL
Abs Immature Granulocytes: 0.03 10*3/uL (ref 0.00–0.07)
Basophils Absolute: 0 10*3/uL (ref 0.0–0.1)
Basophils Relative: 0 %
Eosinophils Absolute: 0 10*3/uL (ref 0.0–0.5)
Eosinophils Relative: 0 %
Immature Granulocytes: 0 %
Lymphocytes Relative: 22 %
Lymphs Abs: 2.6 10*3/uL (ref 0.7–4.0)
Monocytes Absolute: 1.3 10*3/uL — ABNORMAL HIGH (ref 0.1–1.0)
Monocytes Relative: 11 %
Neutro Abs: 8.2 10*3/uL — ABNORMAL HIGH (ref 1.7–7.7)
Neutrophils Relative %: 67 %

## 2022-05-19 LAB — ETHANOL: Alcohol, Ethyl (B): <10 mg/dL (ref <10)

## 2022-05-19 LAB — URINALYSIS, ROUTINE W REFLEX MICROSCOPIC
Bacteria, UA: NONE SEEN
Bilirubin Urine: NEGATIVE
Glucose, UA: NEGATIVE mg/dL
Ketones, ur: NEGATIVE mg/dL
Leukocytes,Ua: NEGATIVE
Nitrite: NEGATIVE
Protein, ur: NEGATIVE mg/dL
Specific Gravity, Urine: 1.025 (ref 1.005–1.030)
pH: 5 (ref 5.0–8.0)

## 2022-05-19 LAB — URINE DRUG SCREEN, QUALITATIVE (ARMC ONLY)
Amphetamines, Ur Screen: NOT DETECTED
Barbiturates, Ur Screen: NOT DETECTED
Benzodiazepine, Ur Scrn: NOT DETECTED
Cannabinoid 50 Ng, Ur ~~LOC~~: NOT DETECTED
Cocaine Metabolite,Ur ~~LOC~~: POSITIVE — AB
MDMA (Ecstasy)Ur Screen: NOT DETECTED
Methadone Scn, Ur: NOT DETECTED
Opiate, Ur Screen: NOT DETECTED
Phencyclidine (PCP) Ur S: NOT DETECTED
Tricyclic, Ur Screen: NOT DETECTED

## 2022-05-19 LAB — COMPREHENSIVE METABOLIC PANEL
ALT: 19 U/L (ref 0–44)
AST: 26 U/L (ref 15–41)
Albumin: 4.2 g/dL (ref 3.5–5.0)
Alkaline Phosphatase: 75 U/L (ref 38–126)
Anion gap: 11 (ref 5–15)
BUN: 23 mg/dL — ABNORMAL HIGH (ref 6–20)
CO2: 23 mmol/L (ref 22–32)
Calcium: 9.4 mg/dL (ref 8.9–10.3)
Chloride: 108 mmol/L (ref 98–111)
Creatinine, Ser: 0.97 mg/dL (ref 0.61–1.24)
GFR, Estimated: >60 mL/min (ref >60)
Glucose, Bld: 100 mg/dL — ABNORMAL HIGH (ref 70–99)
Potassium: 3.8 mmol/L (ref 3.5–5.1)
Sodium: 142 mmol/L (ref 135–145)
Total Bilirubin: 0.9 mg/dL (ref 0.3–1.2)
Total Protein: 8 g/dL (ref 6.5–8.1)

## 2022-05-19 LAB — CBC
HCT: 43.9 % (ref 39.0–52.0)
Hemoglobin: 14.1 g/dL (ref 13.0–17.0)
MCH: 25.6 pg — ABNORMAL LOW (ref 26.0–34.0)
MCHC: 32.1 g/dL (ref 30.0–36.0)
MCV: 79.8 fL — ABNORMAL LOW (ref 80.0–100.0)
Platelets: 306 10*3/uL (ref 150–400)
RBC: 5.5 MIL/uL (ref 4.22–5.81)
RDW: 13.6 % (ref 11.5–15.5)
WBC: 12.2 10*3/uL — ABNORMAL HIGH (ref 4.0–10.5)
nRBC: 0 % (ref 0.0–0.2)

## 2022-05-19 LAB — PROTIME-INR
INR: 1.1 (ref 0.8–1.2)
Prothrombin Time: 13.6 seconds (ref 11.4–15.2)

## 2022-05-19 LAB — APTT: aPTT: 26 seconds (ref 24–36)

## 2022-05-19 LAB — HEMOGLOBIN A1C
Hgb A1c MFr Bld: 5.7 % — ABNORMAL HIGH (ref 4.8–5.6)
Mean Plasma Glucose: 116.89 mg/dL

## 2022-05-19 LAB — C-REACTIVE PROTEIN: CRP: 0.8 mg/dL (ref <1.0)

## 2022-05-19 LAB — SEDIMENTATION RATE: Sed Rate: 19 mm/hr (ref 0–20)

## 2022-05-19 LAB — CBG MONITORING, ED: Glucose-Capillary: 103 mg/dL — ABNORMAL HIGH (ref 70–99)

## 2022-05-19 MED ORDER — AMLODIPINE BESYLATE 10 MG PO TABS
10.0000 mg | ORAL_TABLET | Freq: Every day | ORAL | Status: DC
  Administered 2022-05-19 – 2022-05-21 (×2): 10 mg via ORAL
  Filled 2022-05-19 (×2): qty 1

## 2022-05-19 MED ORDER — METOCLOPRAMIDE HCL 5 MG/ML IJ SOLN
10.0000 mg | Freq: Once | INTRAMUSCULAR | Status: AC
  Administered 2022-05-19: 10 mg via INTRAVENOUS
  Filled 2022-05-19: qty 2

## 2022-05-19 MED ORDER — DOXYCYCLINE HYCLATE 100 MG PO TABS
100.0000 mg | ORAL_TABLET | Freq: Two times a day (BID) | ORAL | Status: DC
  Administered 2022-05-19 – 2022-05-20 (×3): 100 mg via ORAL
  Filled 2022-05-19 (×3): qty 1

## 2022-05-19 MED ORDER — ENOXAPARIN SODIUM 60 MG/0.6ML IJ SOSY
0.5000 mg/kg | PREFILLED_SYRINGE | INTRAMUSCULAR | Status: DC
  Administered 2022-05-19 – 2022-05-21 (×3): 57.5 mg via SUBCUTANEOUS
  Filled 2022-05-19 (×3): qty 0.6

## 2022-05-19 MED ORDER — NICOTINE 21 MG/24HR TD PT24
21.0000 mg | MEDICATED_PATCH | Freq: Every day | TRANSDERMAL | Status: DC
  Filled 2022-05-19 (×3): qty 1

## 2022-05-19 MED ORDER — SENNOSIDES-DOCUSATE SODIUM 8.6-50 MG PO TABS
1.0000 | ORAL_TABLET | Freq: Every evening | ORAL | Status: DC | PRN
  Filled 2022-05-19: qty 1

## 2022-05-19 MED ORDER — ASPIRIN 81 MG PO TBEC
81.0000 mg | DELAYED_RELEASE_TABLET | Freq: Every morning | ORAL | Status: DC
  Administered 2022-05-20 – 2022-05-21 (×2): 81 mg via ORAL
  Filled 2022-05-19 (×2): qty 1

## 2022-05-19 MED ORDER — PANTOPRAZOLE SODIUM 40 MG PO TBEC
40.0000 mg | DELAYED_RELEASE_TABLET | Freq: Two times a day (BID) | ORAL | Status: DC
  Administered 2022-05-19 – 2022-05-21 (×5): 40 mg via ORAL
  Filled 2022-05-19 (×5): qty 1

## 2022-05-19 MED ORDER — BENZONATATE 100 MG PO CAPS: 100.0000 mg | ORAL_CAPSULE | Freq: Three times a day (TID) | ORAL | Status: DC | PRN

## 2022-05-19 MED ORDER — HYDROXYZINE HCL 50 MG PO TABS: 100.0000 mg | ORAL_TABLET | Freq: Three times a day (TID) | ORAL | Status: DC | PRN

## 2022-05-19 MED ORDER — MIRTAZAPINE 15 MG PO TABS
15.0000 mg | ORAL_TABLET | Freq: Every day | ORAL | Status: DC
  Administered 2022-05-19 – 2022-05-20 (×2): 15 mg via ORAL
  Filled 2022-05-19 (×2): qty 1

## 2022-05-19 MED ORDER — TRAMADOL HCL 50 MG PO TABS
50.0000 mg | ORAL_TABLET | Freq: Four times a day (QID) | ORAL | Status: DC | PRN
  Administered 2022-05-19: 50 mg via ORAL
  Filled 2022-05-19: qty 1

## 2022-05-19 MED ORDER — CITALOPRAM HYDROBROMIDE 20 MG PO TABS
10.0000 mg | ORAL_TABLET | Freq: Every day | ORAL | Status: DC
  Administered 2022-05-19 – 2022-05-21 (×3): 10 mg via ORAL
  Filled 2022-05-19 (×3): qty 1

## 2022-05-19 MED ORDER — QUETIAPINE FUMARATE 25 MG PO TABS
100.0000 mg | ORAL_TABLET | Freq: Every day | ORAL | Status: DC
  Administered 2022-05-19 – 2022-05-20 (×2): 100 mg via ORAL
  Filled 2022-05-19 (×2): qty 4

## 2022-05-19 MED ORDER — ONDANSETRON HCL 4 MG/2ML IJ SOLN: 4.0000 mg | Freq: Three times a day (TID) | INTRAMUSCULAR | Status: DC | PRN

## 2022-05-19 MED ORDER — HYDRALAZINE HCL 20 MG/ML IJ SOLN: 5.0000 mg | INTRAMUSCULAR | Status: DC | PRN

## 2022-05-19 MED ORDER — ATORVASTATIN CALCIUM 20 MG PO TABS
80.0000 mg | ORAL_TABLET | Freq: Every evening | ORAL | Status: DC
  Administered 2022-05-19 – 2022-05-20 (×2): 80 mg via ORAL
  Filled 2022-05-19 (×2): qty 4

## 2022-05-19 MED ORDER — STROKE: EARLY STAGES OF RECOVERY BOOK: Freq: Once | Status: AC

## 2022-05-19 MED ORDER — IBUPROFEN 400 MG PO TABS: 200.0000 mg | ORAL_TABLET | Freq: Four times a day (QID) | ORAL | Status: DC | PRN

## 2022-05-19 MED ORDER — LISINOPRIL 20 MG PO TABS
40.0000 mg | ORAL_TABLET | Freq: Every day | ORAL | Status: DC
  Administered 2022-05-19 – 2022-05-21 (×2): 40 mg via ORAL
  Filled 2022-05-19 (×2): qty 2

## 2022-05-19 MED ORDER — METHOCARBAMOL 500 MG PO TABS
500.0000 mg | ORAL_TABLET | Freq: Three times a day (TID) | ORAL | Status: DC | PRN
  Administered 2022-05-19 – 2022-05-21 (×2): 500 mg via ORAL
  Filled 2022-05-19 (×2): qty 1

## 2022-05-19 NOTE — ED Notes (Signed)
Dr. Blaine Hamper notified that pt would like to speak with him and does not want a heart health diet, but a regular one with double portions. Pt allowed this RN to do an NIHSS which was 11. Provider notified of this and that the night shift had an NIHSS of 10

## 2022-05-19 NOTE — Procedures (Signed)
Patient came to eeg room -refused eeg due his migraine.  Notified Dr.Kirkpatrick

## 2022-05-19 NOTE — Consult Note (Signed)
[facility] Tele[facility]rology Consult Services   Patient Name:   Samuel Molina, Samuel Molina Date of Birth:   06/30/1960 Identification Number:   MRN - [id number] Date of Service:   05/19/2022 05:29:53  Diagnosis:       I63.9 - Cerebrovascular accident (CVA), unspecified mechanism ([place])  Impression:       62 y/o man with history of htn, afib on eliquis, anxiety, crug use, tobacco use, prior ICH, prior stroke w/ residual left side vision loss, who presents with left side weakness.    Patient is not eligible for thrombolytic due to use of eliquis and history of ICH. He has recurrent episodes of left side weakness since 2021 and not clear if these are all recrudescence in setting of illness vs cocaine use, subclinical seizure, or conversion.    Recommend CTA head/neck and MRI brain w/o. If negative for ischemia, would obtain EEG. If negative, would consider Psych consult to address this as possible conversion disorder.  Our recommendations are outlined below.  Recommendations:        Stroke/Telemetry Floor       Neuro Checks       Bedside Swallow Eval       DVT Prophylaxis       IV Fluids, Normal Saline       Head of Bed 30 Degrees       Euglycemia and Avoid Hyperthermia (PRN Acetaminophen)       Hold Anticoagulation for Now       Initiate or continue Aspirin 81 MG daily       Antihypertensives PRN if Blood pressure is greater than 220/120 or there is a concern for End organ damage/contraindications for permissive HTN. If blood pressure is greater than 220/120 give labetalol PO or IV or Vasotec IV with a goal of 15% reduction in BP during the first 24 hours.  Sign Out:       Discussed with Emergency Department Provider    ------------------------------------------------------------------------------  Advanced Imaging: Advanced Imaging Deferred because:  Non-disabling symptoms as verified by the patient; no cortical signs so not consistent with LVO   Metrics: Last  Known Well: 05/19/2022 04:00:00 [facility] Notification Time: 05/19/2022 05:29:53 Arrival Time: 05/19/2022 05:10:00 Stamp Time: 05/19/2022 05:29:53 Initial Response Time: 05/19/2022 05:32:00 Symptoms: left side weakness. Initial patient interaction: 05/19/2022 05:35:00 NIHSS Assessment Completed: 05/19/2022 05:46:10 Patient is not a candidate for Thrombolytic. Thrombolytic Medical Decision: 05/19/2022 05:46:12 Patient was not deemed candidate for Thrombolytic because of following reasons: Use of NOAs within 48 hours.  CT head showed no acute hemorrhage or acute core infarct.  Primary Provider Notified of Diagnostic Impression and Management Plan on: 05/19/2022 06:04:25    ------------------------------------------------------------------------------  History of Present Illness: Patient is a 62 year old Male.  Patient was brought by EMS for symptoms of left side weakness. Samuel Molina is a 62 y/o man with history of htn, afib on eliquis, anxiety, crug use, tobacco use, prior ICH, prior stroke w/ residual left side vision loss, who presents with left side weakness.  Patient tells me he was up all night grieving the loss of his friend who died. He was at his friend's house. He left and was walking home and left leg gave out on the walk home and a car drove by and called 911. He states at baseline, he has no left side weakness.  He did use cocaine on Wednesday, the first time in 43month. He took an edible Thursday.  He tells me he has had multiple episodes of  left side weakness since a stroke in 2021. Notes that he was initially thought to have bells palsy but then went to another facility and received TPA.    Past Medical History:      Hypertension      Atrial Fibrillation      Stroke  Medications:  Anticoagulant use:  Yes eliquis last dose last night Antiplatelet use: Yes asa 81 Reviewed EMR for current medications  Allergies:  Reviewed  Social  History: Smoking: Yes Alcohol Use: Yes Drug Use: Yes  Family History:  There is no family history of premature cerebrovascular disease pertinent to this consultation  ROS : 14 Points Review of Systems was performed and was negative except mentioned in HPI.  Past Surgical History: There Is No Surgical History Contributory To Today's Visit     Examination: BP(165/93), Pulse(73), Blood Glucose(103) 1A: Level of Consciousness - Alert; keenly responsive + 0 1B: Ask Month and Age - Both Questions Right + 0 1C: Blink Eyes & Squeeze Hands - Performs Both Tasks + 0 2: Test Horizontal Extraocular Movements - Normal + 0 3: Test Visual Fields - Partial Hemianopia + 1 4: Test Facial Palsy (Use [place]imace if Obtunded) - Normal symmetry + 0 5A: Test Left Arm Motor Drift - Some Effort Against Gravity + 2 5B: Test Right Arm Motor Drift - No Drift for 10 Seconds + 0 6A: Test Left Leg Motor Drift - Some Effort Against Gravity + 2 6B: Test Right Leg Motor Drift - No Drift for 5 Seconds + 0 7: Test Limb Ataxia (FNF/Heel-Shin) - No Ataxia + 0 8: Test Sensation - Mild-Moderate Loss: Can Sense Being Touched + 1 9: Test Language/Aphasia - Normal; No aphasia + 0 10: Test Dysarthria - Normal + 0 11: Test Extinction/Inattention - No abnormality + 0  NIHSS Score: 6  NIHSS Free Text : left reduced sensation  left upper field cut  Pre-Morbid Modified Rankin Scale: 1 Points = No significant disability despite symptoms; able to carry out all usual duties and activities  Spoke with : Beather Arbour  Patient/Family was informed the Neurology Consult would occur via TeleHealth consult by way of interactive audio and video telecommunications and consented to receiving care in this manner.   Patient is being evaluated for possible acute neurologic impairment and high probability of imminent or life-threatening deterioration. I spent total of 30 minutes providing care to this patient, including time for face to face  visit via telemedicine, review of medical records, imaging studies and discussion of findings with providers, the patient and/or family.   Dr Courtney Paris   [facility] For Inpatient follow-up with [facility] physician please call [facility] [phone]. This is not an outpatient service. Post hospital discharge, please contact hospital directly.  Please do not communicate with [facility] physicians via secure chat. If you have any questions, Please contact [facility]. Please call or reconsult our service if there are any clinical or diagnostic changes.

## 2022-05-19 NOTE — Progress Notes (Addendum)
SLP Screen Note  Patient Details Name: Samuel Molina MRN: [id number] DOB: 01-29-1961   Cancelled treatment:       Reason Eval/Treat Not Completed: SLP screened, no needs identified, will sign off (chart reviewed; consulted NSG)  Met w/ pt this morning in room. He was standing at his bedside unable to fully stand/walk d/t c/o "cramp in my Left leg". Strongly encouraged pt to sit down instead of attempting to walk; NSG called to room d/t pt's c/o pain, which she addressed.  Pt also requested multiple juices but decline water when offered. He consumed these during the bedside screening. Pt denied any difficulty swallowing and is currently on a regular diet; tolerates swallowing pills w/ water per NSG.  Pt conversed in conversation w/out expressive/receptive deficits noted; pt denied any speech-language deficits stating he could talk "fine". Speech clear, intelligible. Pt gave his Lunch order to this SLP asking for double portions of various foods stating "I'm a fat guy". OF NOTE: pt exhibited slight-min decreased facial tone at rest and during speech -- this did not appear to impact his speech intelligibility nor his swallowing(No anterior leakage occurred during drinking liquids). Per Neurology note, "There was some question of abnormal signal in the 7th cranial nerve on a previous hospitalization which could be related to Bell's Palsy, so it is possible there is a history of this, but it is unclear"; also, "His UDS is consistently positive for cocaine.".   Also per Neurology note, "negative MRI. With his exam being very consistent with nonorganic presentation, coupled with the fact that he has had repeated similar presentations with negative workup, are strongly suggestive for nonorganic etiology. I discussed with him the fact that I felt that this was conversion disorder and he was not very receptive to this diagnosis initially, though did seem to acquiesce some. I think mainstay of treatment will be  psychotherapy to identify the underlying stressors that are contributing to his recurrent episodes.".  No further acute skilled ST services indicated as pt appears at a functional baseline for communication and swallowing. Recommend f/u on an Outpatient basis is needs indicate post Discharge. Pt agreed. MD/NSG to reconsult if any change in status while admitted.       Orinda Kenner, MS, CCC-SLP Speech Language Pathologist Rehab Services; [place] [phone] (ascom) Khamora Karan 05/19/2022, 11:30 AM

## 2022-05-19 NOTE — ED Notes (Signed)
Pt refusing to be hooked back up to monitoring cords once returned from MRI.  Only wants call bell in place.

## 2022-05-19 NOTE — ED Triage Notes (Signed)
Pt to ED via EMS pick up from street called out for stroke like symptoms.  Pt states was walking back from a friend's house when suddenly had left side weakness, numbness, slurred speech and facial droop.  States last known well 0430.  EMS CBG 037, 955 systolic pressure, 72 HR.  Pt has hx of stroke in 2021 and given TPA, has had TIA's x4 since, and another stroke in 2023.  Pt is A&Ox4, left grip weak, still reports numbness, left facial droop.  Pt cleared to go to CT scan upon arrival by Dr. Beather Arbour on EMS stretcher and back room 19,  tele neuro contacted.    States used edible around 1300 yesterday, and last used cocaine on Wednesday.

## 2022-05-19 NOTE — Progress Notes (Signed)
   05/19/22 0600  Spiritual Encounters  Type of Visit Initial  Care provided to: Patient  Conversation partners present during encounter Nurse  Referral source Code page  Reason for visit Code  OnCall Visit Yes  Spiritual Framework  Presenting Themes Significant life change;Impactful experiences and emotions  Spiritual Care Plan  Spiritual Care Issues Still Outstanding No further spiritual care needs at this time (see row info)   Chap visited with patient in response to a code stroke. Patient had no family at the moment and Melven Sartorius requested the nurse to page once the family comes or as need be.

## 2022-05-19 NOTE — Consult Note (Signed)
Neurology Consultation Reason for Consult: Left sided weakness Referring Physician: Mora Bellman  CC: Left sided weakness  History is obtained from:patient  HPI: Samuel Molina is a 62 y.o. male with a history of cocaine use, recent loss of a friend, multiple presentations with left-sided weakness with negative workup.  He was admitted and December 2021 with left-sided weakness to [facility] where he received IV thrombolytics.  He did not have an MRI done without hospitalization, and was discharged with a diagnosis of ischemic stroke, however he returned to the ER within a week of discharge and his MRI was negative. He reports it took a couple of weeks to improve at that time.  He has since had several other similar presentations, all with negative workup. There was some question of abnormal signal in the 7th cranial nerve on a previous hospitalization which could be related to bell's palsy, so it is possible there is a history of this, but I thin kthis is unclear. He did have a marginal increase in CSF WBC of 8 at that time of unclear significance with normal protein and glucose. He had extensive serology and csf testing at that time which was otherwise negative. His UDS is consistently positive for cocaine.   Of note whenI ask if he had headache with previous presentation he denies this, but does have a headache today. In the notes, however, it seems headache is a prominent complaint.    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  It has been subsequently more clearly delineated on MRI is here and is consistent with an incidental lipoma.  He is fixated on the fact that he had "hemorrhage into my brain" in the past.  He is unwilling to discuss the possibility that he did not previously have hemorrhage or ischemic stroke.    LKW: 4:30 am tnk given?: no,  anticoagulation     Past Medical History:  Diagnosis Date   Anxiety    Depression    GERD (gastroesophageal reflux disease)    Obesity    Stroke ([place])    Substance abuse ([place])      Family History  Problem Relation Age of Onset   Vasculitis Mother      Social History:  reports that he has been smoking cigarettes. He has been smoking an average of .5 packs per day. He has never used smokeless tobacco. He reports that he does not currently use alcohol. He reports current drug use. Drug: Cocaine.   Exam: Current vital signs: BP (!) 143/78   Pulse (!) 55   Temp 98 F (36.7 C) (Oral)   Resp 16   Ht '6\' 1"'$  (1.854 m)   Wt 115.2 kg   SpO2 94%   BMI 33.51 kg/m  Vital signs in last 24 hours: Temp:  [98 F (36.7 C)] 98 F (36.7 C) (01/26 0530) Pulse Rate:  [55-73] 55 (01/26 0600) Resp:  [14-20] 16 (01/26 0600) BP: (143-164)/(78-91) 143/78 (01/26 0600) SpO2:  [94 %-97 %] 94 % (01/26 0600) Weight:  [115.2 kg] 115.2 kg (01/26 0532)   Physical Exam  Appears well-developed and well-nourished.   Neuro: Mental Status: Patient is awake, alert, oriented to person, place, month, year, and situation. Patient is able to give a clear and coherent history. No signs of aphasia or neglect Cranial Nerves: II: Visual Fields are full. Pupils are equal, round, and  reactive to light.   III,IV, VI: EOMI without ptosis or diploplia.  V: He splits midline to pin on the forehead VII: Facial movement is weak on the left, when he puts air in his cheeks, his right side puffs out without his left puffing.  Motor: Motor exam is markedly inconsistent with initial plegia of the left arm that he is then able to perform FNF with without drift.  Sensory: Sensation is dimininshed on the left  Cerebellar: FNF intact bilaterally   I have reviewed labs in epic and the results pertinent to this consultation are: Cmp - unremarkable  I have reviewed the images obtained:MRI - no acute  findings  Impression: 62 year old male with markedly inconsistent exam, negative MRI.  With his exam being very consistent with nonorganic presentation, coupled with the fact that he has had repeated similar presentations with negative workup, are strongly suggestive for nonorganic etiology.  I discussed with him the fact that I felt that this was conversion disorder and he was not very receptive to this diagnosis initially, though did seem to acquiesce some.  I think mainstay of treatment will be psychotherapy to identify the underlying stressors that are contributing to his recurrent episodes.  Though I think the yield is very low, the last diagnostic test that may be of some benefit would be EEG and therefore it is reasonable to pursue this.  This is negative then no further neurodiagnostic testing is needed.  He also complains of unilateral photophobic headache, this was present with previous admissions as well though the patient apparently does not remember this.  Migraine may be playing some role, and I will treat with Reglan.  Recommendations: 1) EEG 2) Reglan 10 mg IV x 1 3) if EEG is negative, no further neurodiagnostic testing is needed 4) could consider PT/OT, psychiatric evaluation   Roland Rack, MD [facility] [phone]  If 7pm- 7am, please page neurology on call as listed in [place].

## 2022-05-19 NOTE — ED Notes (Signed)
Report given to Ashley, RN

## 2022-05-19 NOTE — ED Provider Notes (Signed)
[facility] Provider Note    Event Date/Time   First MD Initiated Contact with Patient 05/19/22 [phone]     (approximate)   History   Code stroke   HPI  Samuel Molina is a 62 y.o. male brought to the ED via EMS from the street for strokelike symptoms.  Patient with a history of cocaine abuse, prior CVA who was walking back from his friend's house where he was grieving the loss of another friend when he experienced left-sided weakness/numbness, slurred speech and facial droop.  Onset of symptoms 30 minutes prior to arrival.  Admits to cocaine use as well as edibles yesterday.  Denies headache, vision changes, chest pain, shortness of breath, abdominal pain, nausea or vomiting.     Past Medical History   Past Medical History:  Diagnosis Date   Anxiety    Depression    GERD (gastroesophageal reflux disease)    Obesity    Stroke [facility])    Substance abuse [facility])      Active Problem List   Patient Active Problem List   Diagnosis Date Noted   CVA (cerebral vascular accident) ([place]) 05/19/2022   Cocaine abuse with cocaine-induced mood disorder ([place]) 01/14/2022   Obesity (BMI 30-39.9) 11/23/2021   Dyspepsia    Rectal bleeding    MRSA bacteremia 11/10/2021   Streptococcal bacteremia 11/10/2021   Bacteremia 11/09/2021   Depression with anxiety 11/08/2021   Hypertensive urgency 11/08/2021   Suicidal ideation 11/08/2021   Cellulitis and abscess of left leg 11/08/2021   Aggressive behavior    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)    Depression    HLD (hyperlipidemia)    Tobacco abuse    Chest pain    Left-sided weakness    COVID-19 virus infection    Bell's palsy 04/26/2020   Personality disorder ([place])  02/13/2020   Bursitis 02/13/2020   Substance induced mood disorder ([place]) 02/12/2020   Severe recurrent major depression without psychotic features ([place]) 01/21/2020   Cocaine abuse ([place]) 01/21/2020   Hip pain 01/21/2020   Major depression 01/20/2020   Gastroesophageal reflux disease with esophagitis    Morbid obesity ([place]) 09/19/2017   Arthritis of knee 11/03/2015     Past Surgical History   Past Surgical History:  Procedure Laterality Date   COLONOSCOPY WITH PROPOFOL N/A 11/23/2021   Procedure: COLONOSCOPY WITH PROPOFOL;  Surgeon: [name], MD;  Location: [facility] ENDOSCOPY;  Service: Gastroenterology;  Laterality: N/A;   ESOPHAGOGASTRODUODENOSCOPY N/A 11/23/2021   Procedure: ESOPHAGOGASTRODUODENOSCOPY (EGD);  Surgeon: [name], MD;  Location: [facility] ENDOSCOPY;  Service: Gastroenterology;  Laterality: N/A;   ESOPHAGOGASTRODUODENOSCOPY (EGD) WITH PROPOFOL N/A 09/20/2017   Procedure: ESOPHAGOGASTRODUODENOSCOPY (EGD) WITH PROPOFOL;  Surgeon: Lin Landsman, MD;  Location: [facility] ENDOSCOPY;  Service: Gastroenterology;  Laterality: N/A;   KNEE SURGERY Right    TEE WITHOUT CARDIOVERSION N/A 11/15/2021   Procedure: TRANSESOPHAGEAL ECHOCARDIOGRAM (TEE);  Surgeon: Minna Merritts, MD;  Location: [facility] ORS;  Service: Cardiovascular;  Laterality: N/A;     Home Medications   Prior to Admission medications   Medication Sig Start Date End Date Taking? Authorizing Provider  amLODipine (NORVASC) 10 MG tablet Take 1 tablet (10 mg total) by mouth daily. 11/24/21   Annita Brod, MD  atorvastatin (  LIPITOR) 80 MG tablet Take 1 tablet (80 mg total) by mouth every evening. 11/24/21   Annita Brod, MD  benzonatate (TESSALON PERLES) 100 MG capsule Take 1 capsule (100 mg total) by mouth 3 (three) times daily as needed for cough. 01/04/22 01/04/23  Cuthriell, Charline Bills, PA-C  brompheniramine-pseudoephedrine-DM 30-2-10 MG/5ML syrup Take 10 mLs by mouth 4 (four) times daily as needed. 01/04/22    Cuthriell, Charline Bills, PA-C  citalopram (CELEXA) 10 MG tablet Take 1 tablet (10 mg total) by mouth daily. 01/15/22 02/14/22  Patrecia Pour, NP  fluticasone (FLONASE) 50 MCG/ACT nasal spray Place 1 spray into both nostrils 2 (two) times daily. 01/04/22   Cuthriell, Charline Bills, PA-C  ibuprofen (ADVIL) 800 MG tablet Take 800 mg by mouth every 8 (eight) hours as needed for pain. 12/27/21   [provider]  ketorolac (TORADOL) 10 MG tablet Take 1 tablet (10 mg total) by mouth every 6 (six) hours as needed. Patient not taking: Reported on 01/14/2022 01/04/22   Cuthriell, Charline Bills, PA-C  lisinopril (ZESTRIL) 40 MG tablet Take 1 tablet (40 mg total) by mouth daily. 11/24/21   Annita Brod, MD  pantoprazole (PROTONIX) 40 MG tablet Take 1 tablet (40 mg total) by mouth 2 (two) times daily. 11/24/21   Annita Brod, MD  QUEtiapine (SEROQUEL) 100 MG tablet Take 1 tablet (100 mg total) by mouth at bedtime. 01/15/22 02/14/22  Patrecia Pour, NP  traMADol (ULTRAM) 50 MG tablet Take 50 mg by mouth every 6 (six) hours as needed for pain. 12/27/21   [provider]  omeprazole (PRILOSEC OTC) 20 MG tablet Take 2 tablets (40 mg total) by mouth 2 (two) times daily before a meal. 09/19/17 01/29/19  Vanga, Tally Due, MD     Allergies  Acetaminophen   Family History   Family History  Problem Relation Age of Onset   Vasculitis Mother      Physical Exam  Triage Vital Signs: ED Triage Vitals  Enc Vitals Group     BP      Pulse      Resp      Temp      Temp src      SpO2      Weight      Height      Head Circumference      Peak Flow      Pain Score      Pain Loc      Pain Edu?      Excl. in [place]?     Updated Vital Signs: BP (!) 149/87   Pulse 73   Temp 98 F (36.7 C) (Oral)   Resp 14   Ht '6\' 1"'$  (1.854 m)   Wt 115.2 kg   SpO2 97%   BMI 33.51 kg/m    General: Awake, mild distress.  CV:  RRR.  Good peripheral perfusion.  Resp:  Normal effort.  CTAB. Abd:  No  distention.  Other:  Left-sided weakness.   ED Results / Procedures / Treatments  Labs (all labs ordered are listed, but only abnormal results are displayed) Labs Reviewed  CBC - Abnormal; Notable for the following components:      Result Value   WBC 12.2 (*)    MCV 79.8 (*)    MCH 25.6 (*)    All other components within normal limits  DIFFERENTIAL - Abnormal; Notable for the following components:   Neutro Abs 8.2 (*)    Monocytes  Absolute 1.3 (*)    All other components within normal limits  COMPREHENSIVE METABOLIC PANEL - Abnormal; Notable for the following components:   Glucose, Bld 100 (*)    BUN 23 (*)    All other components within normal limits  CBG MONITORING, ED - Abnormal; Notable for the following components:   Glucose-Capillary 103 (*)    All other components within normal limits  ETHANOL  PROTIME-INR  APTT  URINE DRUG SCREEN, QUALITATIVE ([facility] ONLY)  URINALYSIS, ROUTINE W REFLEX MICROSCOPIC     EKG  ED ECG REPORT I, Everton Bertha J, the attending physician, personally viewed and interpreted this ECG.   Date: 05/19/2022  EKG Time: 0526  Rate: 72  Rhythm: normal sinus rhythm  Axis: Normal  Intervals:none  ST&T Change: Nonspecific T wave inversion No significant change from 11/12/2021   RADIOLOGY I have independently visualized and interpreted patient's CT as well as noted the radiology interpretation:  CT head discussed with radiologist: No ICH  Official radiology report(s): CT HEAD CODE STROKE WO CONTRAST  Addendum Date: 05/19/2022   ADDENDUM REPORT: 05/19/2022 05:38 ADDENDUM: Study discussed by telephone with Dr. Luvenia Starch Khori Rosevear on 05/19/2022 at 0531 hours. Electronically Signed   By: Genevie Ann M.D.   On: 05/19/2022 05:38   Result Date: 05/19/2022 CLINICAL DATA:  Code stroke. 62 year old male with left side weakness, slurred speech, facial droop. EXAM: CT HEAD WITHOUT CONTRAST TECHNIQUE: Contiguous axial images were obtained from the base of the skull through  the vertex without intravenous contrast. RADIATION DOSE REDUCTION: This exam was performed according to the departmental dose-optimization program which includes automated exposure control, adjustment of the mA and/or kV according to patient size and/or use of iterative reconstruction technique. COMPARISON:  Brain MRI and head CT 11/08/2021. FINDINGS: Brain: Cerebral volume remains normal for age. No midline shift, ventriculomegaly, mass effect, evidence of mass lesion, intracranial hemorrhage or evidence of cortically based acute infarction. Gray-white matter differentiation is within normal limits throughout the brain. Vascular: Stable. Unchanged asymmetric density of a right MCA branch in the sylvian fissure on series 3, image 12, was negative last year. No suspicious intracranial vascular hyperdensity. Skull: Chronic appearing deformity of the left maxillary sinus. No acute osseous abnormality identified. Sinuses/Orbits: Visualized paranasal sinuses and mastoids are clear. Other: Orbit and scalp soft tissues remain negative. ASPECTS [facility] Stroke Program Early CT Score) Total score (0-10 with 10 being normal): 10 IMPRESSION: Stable and normal for age noncontrast CT appearance of the brain. ASPECTS 10. Electronically Signed: By: Genevie Ann M.D. On: 05/19/2022 05:28     PROCEDURES:  Critical Care performed: Yes, see critical care procedure note(s)  NIH Stroke Scale  Interval: Baseline Time: On arrival Person Administering Scale: Maheen Cwikla J  Administer stroke scale items in the order listed. Record performance in each category after each subscale exam. Do not go back and change scores. Follow directions provided for each exam technique. Scores should reflect what the patient does, not what the clinician thinks the patient can do. The clinician should record answers while administering the exam and work quickly. Except where indicated, the patient should not be coached (i.e., repeated requests to  patient to make a special effort).   1a  Level of consciousness: 0=alert; keenly responsive  1b. LOC questions:  0=Performs both tasks correctly  1c. LOC commands: 0=Performs both tasks correctly  2.  Best Gaze: 0=normal  3.  Visual: 0=No visual loss  4. Facial Palsy: 1=Minor paralysis (flattened nasolabial fold, asymmetric on smiling)  5a.  Motor left arm: 2=Some effort against gravity, limb cannot get to or maintain (if cured) 90 (or 45) degrees, drifts down to bed, but has some effort against gravity  5b.  Motor right arm: 0=No drift, limb holds 90 (or 45) degrees for full 10 seconds  6a. motor left leg: 2=Some effort against gravity, limb cannot get to or maintain (if cured) 90 (or 45) degrees, drifts down to bed, but has some effort against gravity  6b  Motor right leg:  0=No drift, limb holds 90 (or 45) degrees for full 10 seconds  7. Limb Ataxia: 0=Absent  8.  Sensory: 1=Mild to moderate sensory loss; patient feels pinprick is less sharp or is dull on the affected side; there is a loss of superficial pain with pinprick but patient is aware He is being touched  9. Best Language:  0=No aphasia, normal  10. Dysarthria: 0=Normal  11. Extinction and Inattention: 0=No abnormality  12. Distal motor function: 0=Normal   Total:   6     CRITICAL CARE Performed by: Paulette Blanch   Total critical care time: 30 minutes  Critical care time was exclusive of separately billable procedures and treating other patients.  Critical care was necessary to treat or prevent imminent or life-threatening deterioration.  Critical care was time spent personally by me on the following activities: development of treatment plan with patient and/or surrogate as well as nursing, discussions with consultants, evaluation of patient's response to treatment, examination of patient, obtaining history from patient or surrogate, ordering and performing treatments and interventions, ordering and review of laboratory  studies, ordering and review of radiographic studies, pulse oximetry and re-evaluation of patient's condition.  Marland Kitchen1-3 Lead EKG Interpretation  Performed by: Paulette Blanch, MD Authorized by: Paulette Blanch, MD     Interpretation: normal     ECG rate:  70   ECG rate assessment: normal     Rhythm: sinus rhythm     Ectopy: none     Conduction: normal   Comments:     Placed on cardiac monitor to evaluate for arrhythmias    MEDICATIONS ORDERED IN ED: Medications - No data to display   IMPRESSION / MDM / [place] / ED COURSE  I reviewed the triage vital signs and the nursing notes.                             62 year old male presenting with left-sided deficits, code stroke initiated in the field.  Differential diagnosis includes but is not limited to [place], CVA, TIA, etc.  I have personally reviewed patient's records and note his last ED visit for left-sided weakness on 01/19/2022.  Patient's presentation is most consistent with acute presentation with potential threat to life or bodily function.  The patient is on the cardiac monitor to evaluate for evidence of arrhythmia and/or significant heart rate changes.  Patient was met in the ambulance today upon his arrival to the emergency department and sent emergently for CT head.  Teleneurology set up in the room for code stroke evaluation.  Clinical Course as of 05/19/22 [id number]  Fri May 19, 2022  0600 Appreciate teleneurology evaluation; recommends MRI brain.  If negative, recommend EEG and psychiatry consultation given patient is grieving the loss of a friend.  Hold Eliquis for now.  Have consulted hospitalist services for evaluation and admission. [JS]    Clinical Course User Index [JS] Paulette Blanch, MD  FINAL CLINICAL IMPRESSION(S) / ED DIAGNOSES   Final diagnoses:  Cerebrovascular accident (CVA), unspecified mechanism ([place])     Rx / DC Orders   ED Discharge Orders     None        Note:  This document was  prepared using Dragon voice recognition software and may include unintentional dictation errors.   Paulette Blanch, MD 05/19/22 [phone]

## 2022-05-19 NOTE — ED Notes (Signed)
Agricultural consultant at bedside. Pt provided with some ginger-ale

## 2022-05-19 NOTE — ED Notes (Signed)
Lab at bedside

## 2022-05-19 NOTE — ED Notes (Addendum)
As per Dr. Blaine Hamper, pt can eat and drink a heart health diet. Pt given ginger-ale as that is the one request I can fulfill at this time. Pt also provided mango icey as requested.

## 2022-05-19 NOTE — Progress Notes (Signed)
Patient was refusing EEG due to his headache, I indicated that if he were to not get his EEG now, it would not be done until Monday. He asked to see my supervisor to which I indicated I had no supervisor on site, and he indicated that he would like another neurologist.   I indicated ot him that I am the only neurologist here over the weekend, and there would not be another neurologist on site until Monday to which he indicated that he would not accept me to see him again.   He continues to complain of headache and I would continue to treat as migraine. Could try compazine '10mg'$ /benadryl 12.'5mg'$ /toradol '150mg'$  all IV. If this provides no relief, could use depacon '1000mg'$  and magnesium 2g x 1.   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 If he remains here until Monday, would continue to recommend EEG.   Roland Rack, MD [facility] [phone]  If 7pm- 7am, please page neurology on call as listed in [place].

## 2022-05-19 NOTE — ED Notes (Signed)
Patient transported to MRI 

## 2022-05-19 NOTE — Evaluation (Signed)
Occupational Therapy Evaluation Patient Details Name: Samuel Molina MRN: [id number] DOB: 07/18/1960 Today's Date: 05/19/2022   History of Present Illness Samuel Molina is a 64yoM who comes to [facility] on 1/26 c Left hemi weakness. History of cocaine use, recent loss of a friend, multiple presentations with left-sided weakness with negative workup. Per neurology 1/26 "He is fixated on the fact that he had "hemorrhage into my brain" in the past.  He is unwilling to discuss the possibility that he did not previously have hemorrhage or ischemic stroke." UDS is positive for cocaine.   Clinical Impression   Samuel Molina was seen for OT evaluation this date. Prior to hospital admission, pt was IND. Pt lives alone with 3 dogs in 2 level home c 5 STE. Pt presents to acute OT demonstrating impaired ADL performance and functional mobility 2/2 decreased activity tolerance and functional strength deficits. Pt currently requires MIN A + HHA sit<>stand x3 at EOB and ~2 steps along EOB (pt slides L foot). Pt demonstrates inconsistent LUE/LLE weakness. SETUP seated feeding tasks using dominant RUE. Upon hospital discharge, recommend HHOT with assistance for all mobility, if unable to have assistance then STR is appropriate (pt stated he will not d/c to STR and will not utilize any AD for mobility).    Recommendations for follow up therapy are one component of a multi-disciplinary discharge planning process, led by the attending physician.  Recommendations may be updated based on patient status, additional functional criteria and insurance authorization.   Follow Up Recommendations  Home health OT     Assistance Recommended at Discharge Intermittent Supervision/Assistance  Patient can return home with the following A lot of help with bathing/dressing/bathroom;Help with stairs or ramp for entrance;A little help with walking and/or transfers    Functional Status Assessment  Patient has had a recent decline in  their functional status and demonstrates the ability to make significant improvements in function in a reasonable and predictable amount of time.  Equipment Recommendations  BSC/3in1    Recommendations for Other Services       Precautions / Restrictions Precautions Precautions: Fall Restrictions Weight Bearing Restrictions: No      Mobility Bed Mobility Overal bed mobility: Independent                  Transfers Overall transfer level: Needs assistance Equipment used: 1 person hand held assist Transfers: Sit to/from Stand Sit to Stand: Min assist                  Balance Overall balance assessment: Needs assistance Sitting-balance support: No upper extremity supported, Feet supported Sitting balance-Leahy Scale: Normal     Standing balance support: No upper extremity supported, During functional activity Standing balance-Leahy Scale: Fair                             ADL either performed or assessed with clinical judgement   ADL Overall ADL's : Needs assistance/impaired                                       General ADL Comments: MIN A simumlated BSC t/f. SETUP self-drinking seated EOB      Pertinent Vitals/Pain Pain Assessment Pain Assessment: No/denies pain     Hand Dominance Right   Extremity/Trunk Assessment Upper Extremity Assessment Upper Extremity Assessment: LUE deficits/detail LUE Deficits / Details: inconsistent weakness  in LUE . 3/5 grip. 5 digit opposition intact LUE Sensation: decreased light touch   Lower Extremity Assessment Lower Extremity Assessment: LLE deficits/detail LLE Deficits / Details: inconsistent weakness       Communication Communication Communication: No difficulties   Cognition Arousal/Alertness: Awake/alert Behavior During Therapy: WFL for tasks assessed/performed Overall Cognitive Status: No family/caregiver present to determine baseline cognitive functioning                                  General Comments: states he will not go to a STR nor would he use any AD at d/c                Home Living Family/patient expects to be discharged to:: Private residence Living Arrangements: Alone Available Help at Discharge: Available PRN/intermittently Type of Home: House Home Access: Stairs to enter [employer] of Steps: 5 Entrance Stairs-Rails: Can reach both Home Layout: Two level;Able to live on main level with bedroom/bathroom Alternate Level Stairs-Number of Steps: 9             Home Equipment: None   Additional Comments: kitchen upstairs      Prior Functioning/Environment Prior Level of Function : Independent/Modified Independent                        OT Problem List: Decreased strength;Decreased range of motion;Decreased activity tolerance;Impaired balance (sitting and/or standing);Decreased safety awareness;Impaired UE functional use;Impaired sensation      OT Treatment/Interventions: Self-care/ADL training;Therapeutic exercise;Neuromuscular education;Energy conservation;DME and/or AE instruction;Therapeutic activities;Patient/family education;Balance training    OT Goals(Current goals can be found in the care plan section) Acute Rehab OT Goals Patient Stated Goal: go home OT Goal Formulation: With patient Time For Goal Achievement: 06/02/22 Potential to Achieve Goals: Good ADL Goals Pt Will Perform Grooming: with modified independence;standing Pt Will Perform Lower Body Dressing: with modified independence;sit to/from stand Pt Will Transfer to Toilet: with modified independence;ambulating;regular height toilet  OT Frequency: Min 2X/week    Co-evaluation              AM-PAC OT "6 Clicks" Daily Activity     Outcome Measure Help from another person eating meals?: None Help from another person taking care of personal grooming?: A Little Help from another person toileting, which includes using  toliet, bedpan, or urinal?: A Little Help from another person bathing (including washing, rinsing, drying)?: A Little Help from another person to put on and taking off regular upper body clothing?: A Little Help from another person to put on and taking off regular lower body clothing?: A Lot 6 Click Score: 18   End of Session    Activity Tolerance: Patient tolerated treatment well Patient left: in bed;with call bell/phone within reach  OT Visit Diagnosis: Other abnormalities of gait and mobility (R26.89);Muscle weakness (generalized) (M62.81)                Time: 3474-2595 OT Time Calculation (min): 20 min Charges:  OT General Charges $OT Visit: 1 Visit OT Evaluation $OT Eval Low Complexity: 1 Low OT Treatments $Self Care/Home Management : 8-22 mins  Dessie Coma, M.S. OTR/L  05/19/22, 3:50 PM  ascom [phone]

## 2022-05-19 NOTE — Progress Notes (Signed)
PT Cancellation Note  Patient Details Name: Samuel Molina MRN: [id number] DOB: [birth year]   Cancelled Treatment:    Reason Eval/Treat Not Completed: Patient at procedure or test/unavailable (Evaluation twice attempted. Pt OTF at first attempt, then later still getting oriented to 1C with NSG assessment. Will continue to follow, inititate evaluation at later date/time.)  1:51 PM, 05/19/22 Etta Grandchild, PT, DPT Physical Therapist - [facility]  [phone] ([place])    [name] 05/19/2022, 1:50 PM

## 2022-05-19 NOTE — Consult Note (Signed)
Telestroke cart was activated at 0523. Per treatment team, pt's LKW was at 0400 with c/o L sided weakness, slurred speech, and facial droop EDP provider assessed pt prior to cart activation. Pt was transported to CT and from CT prior to cart activation.TSMD was paged for code stroke at 67. Dr, Berneda Rose, TSMD appeared on telestroke cart at 0532 to assess the patient. Based on TSMD's assessment, pt does not meet criteria for emergent interventions at this time 2/2 anticoagulant within 48hrs. TSMD to f/u with EDP regarding recommendations. No further needs from Telestroke RN at this time. Telestroke cart disconnected at [phone].Marland Kitchen

## 2022-05-19 NOTE — ED Notes (Signed)
Dr. Tomie China notified "Hello Dr. Blaine Hamper- I am wondering if this pt can eat and drink? He is getting upset and would like ginger ale to start. He also wants a bed. He came in for a stroke. he told me if he does not get his way, he can become "an asshole and aggressive." He is also refusing cardiac monitoring and NIHSS

## 2022-05-19 NOTE — ED Notes (Addendum)
Pt refusing to complete NIHSS. Pt states he has done it several times, and he does not want to do it anymore. Pt stated "I can be an asshole, and I can. I tell you this, so you can help me. I can also get aggressive, if I dont get my way but I dont want to get aggressive." RN informed him that I would try to get him something to eat and drink, but that I cannot expedite him getting a room. Pt currently calm in room.  Pt noted to be moving the right side, but not the left arm or leg. Left face noted to have a slight droop and lips do not move on the left side, like the right. Pt states numbness still to the right side of the body.

## 2022-05-19 NOTE — ED Notes (Signed)
RN aware of bed assignment 

## 2022-05-19 NOTE — ED Notes (Signed)
Lab called and informed them that the providers have ordered blood cultures and asked if they get the blood. Lab took the room number.

## 2022-05-19 NOTE — ED Notes (Signed)
Pt is requesting a bed. RN informed him that I an unable to get him a bed as fast as we can. Pt stated that is unacceptable behavior and he would like to see my supervisor. ED charge RN notified.

## 2022-05-19 NOTE — Progress Notes (Addendum)
Pt refused to put tele on. MD made aware

## 2022-05-19 NOTE — H&P (Signed)
History and Physical    Samuel Molina:[id number] DOB: 1961-01-14 DOA: 05/19/2022  Referring MD/NP/PA:   PCP: System, Provider Not In   Patient coming from:  The patient is coming from home.     Chief Complaint: Left-sided weakness, slurred speech, facial droop  HPI: Samuel Molina is a 62 y.o. male with medical history significant of stroke, TIA, hypertension, hyperlipidemia, depression with anxiety, personality disorder, tobacco abuse, cocaine abuse, rectal bleeding, left side Bell's palsy, who presents with left sided weakness, slurred speech, facial droop.    Per report,  pt was brought to the ED via EMS from the street due to strokelike symptoms. Pt was walking back from his friend's house where he was grieving the loss of another friend. Pt was last known normal at about 430, he developed left-sided weakness, numbness, slurred speech, left facial droop.  His symptoms has been persistent in ED.  Patient has a chest congestion, mild shortness breath, no cough. No nausea vomiting, diarrhea or abdominal pain.  No symptoms of UTI. Pt states that he had spider bite to the lateral side of her right lower leg 2 days ago. He has a small painful knot in that area, no drainage.  No fever or chills.  Patient denies any suicidal homicidal ideations.  No rectal bleeding or dark stool. Pt reports headache.   Data reviewed independently and ED Course: pt was found to have WBC 12.2, GFR> 60, temperature normal, blood pressure 143/78, heart rate 55, 73, RR 20, oxygen saturation 94-99% on room air.  CT of head and MRI of the brain is negative for acute intracranial issues.  Patient is placed on TeleMed follow-up patient, Dr. Leonel Ramsay of neurology is consulted.  Consulted psychiatry, Gust Rung   EKG: I have personally reviewed.  Sinus rhythm, QTc 398, LAD, T wave inversion in inferior leads and V4-V6.   Review of Systems:   General: no fevers, chills, no body weight gain, fatigue HEENT: no  blurry vision, hearing changes or sore throat Respiratory: has mild dyspnea, no coughing, wheezing CV: no chest pain, no palpitations GI: no nausea, vomiting, abdominal pain, diarrhea, constipation GU: no dysuria, burning on urination, increased urinary frequency, hematuria  Ext: no leg edema Neuro:  no vision change or hearing loss. Has sided weakness, slurred speech, facial droop.  Skin: no rash, no skin tear. MSK: No muscle spasm, no deformity, no limitation of range of movement in spin Heme: No easy bruising.  Travel history: No recent long distant travel.   Allergy:  Allergies  Allergen Reactions   Acetaminophen Itching, Nausea And Vomiting and Rash    Past Medical History:  Diagnosis Date   Anxiety    Depression    GERD (gastroesophageal reflux disease)    Obesity    Stroke [facility])    Substance abuse ([place])     Past Surgical History:  Procedure Laterality Date   COLONOSCOPY WITH PROPOFOL N/A 11/23/2021   Procedure: COLONOSCOPY WITH PROPOFOL;  Surgeon: [name], MD;  Location: [facility] ENDOSCOPY;  Service: Gastroenterology;  Laterality: N/A;   ESOPHAGOGASTRODUODENOSCOPY N/A 11/23/2021   Procedure: ESOPHAGOGASTRODUODENOSCOPY (EGD);  Surgeon: [name], MD;  Location: [facility] ENDOSCOPY;  Service: Gastroenterology;  Laterality: N/A;   ESOPHAGOGASTRODUODENOSCOPY (EGD) WITH PROPOFOL N/A 09/20/2017   Procedure: ESOPHAGOGASTRODUODENOSCOPY (EGD) WITH PROPOFOL;  Surgeon: Lin Landsman, MD;  Location: [facility] ENDOSCOPY;  Service: Gastroenterology;  Laterality: N/A;   KNEE SURGERY Right    TEE WITHOUT CARDIOVERSION N/A 11/15/2021   Procedure: TRANSESOPHAGEAL ECHOCARDIOGRAM (TEE);  Surgeon: Rockey Situ,  Kathlene November, MD;  Location: [facility] ORS;  Service: Cardiovascular;  Laterality: N/A;    Social History:  reports that he has been smoking cigarettes. He has been smoking an average of .5 packs per day. He has never used smokeless tobacco. He reports that he does not currently use alcohol. He reports  current drug use. Drug: Cocaine.  Family History:  Family History  Problem Relation Age of Onset   Vasculitis Mother      Prior to Admission medications   Medication Sig Start Date End Date Taking? Authorizing Provider  amLODipine (NORVASC) 10 MG tablet Take 1 tablet (10 mg total) by mouth daily. 11/24/21   Annita Brod, MD  atorvastatin (LIPITOR) 80 MG tablet Take 1 tablet (80 mg total) by mouth every evening. 11/24/21   Annita Brod, MD  benzonatate (TESSALON PERLES) 100 MG capsule Take 1 capsule (100 mg total) by mouth 3 (three) times daily as needed for cough. 01/04/22 01/04/23  Cuthriell, Charline Bills, PA-C  brompheniramine-pseudoephedrine-DM 30-2-10 MG/5ML syrup Take 10 mLs by mouth 4 (four) times daily as needed. 01/04/22   Cuthriell, Charline Bills, PA-C  citalopram (CELEXA) 10 MG tablet Take 1 tablet (10 mg total) by mouth daily. 01/15/22 02/14/22  Patrecia Pour, NP  fluticasone (FLONASE) 50 MCG/ACT nasal spray Place 1 spray into both nostrils 2 (two) times daily. 01/04/22   Cuthriell, Charline Bills, PA-C  ibuprofen (ADVIL) 800 MG tablet Take 800 mg by mouth every 8 (eight) hours as needed for pain. 12/27/21   [provider]  ketorolac (TORADOL) 10 MG tablet Take 1 tablet (10 mg total) by mouth every 6 (six) hours as needed. Patient not taking: Reported on 01/14/2022 01/04/22   Cuthriell, Charline Bills, PA-C  lisinopril (ZESTRIL) 40 MG tablet Take 1 tablet (40 mg total) by mouth daily. 11/24/21   Annita Brod, MD  pantoprazole (PROTONIX) 40 MG tablet Take 1 tablet (40 mg total) by mouth 2 (two) times daily. 11/24/21   Annita Brod, MD  QUEtiapine (SEROQUEL) 100 MG tablet Take 1 tablet (100 mg total) by mouth at bedtime. 01/15/22 02/14/22  Patrecia Pour, NP  traMADol (ULTRAM) 50 MG tablet Take 50 mg by mouth every 6 (six) hours as needed for pain. 12/27/21   [provider]  omeprazole (PRILOSEC OTC) 20 MG tablet Take 2 tablets (40 mg total) by mouth 2 (two) times daily  before a meal. 09/19/17 01/29/19  Lin Landsman, MD    Physical Exam: Vitals:   05/19/22 0547 05/19/22 0600 05/19/22 1229 05/19/22 1259  BP: (!) 149/87 (!) 143/78 136/69 (!) 158/91  Pulse: 73 (!) 55 62 (!) 59  Resp: '14 16 18 20  '$ Temp:   97.9 F (36.6 C) 98.8 F (37.1 C)  TempSrc:   Oral Oral  SpO2: 97% 94% 97% 94%  Weight:      Height:       General: Not in acute distress HEENT:       Eyes: PERRL, EOMI, no scleral icterus.       ENT: No discharge from the ears and nose, no pharynx injection, no tonsillar enlargement.        Neck: No JVD, no bruit, no mass felt. Heme: No neck lymph node enlargement. Cardiac: S1/S2, RRR, No murmurs, No gallops or rubs. Respiratory: No rales, wheezing, rhonchi or rubs. GI: Soft, nondistended, nontender, no rebound pain, no organomegaly, BS present. GU: No hematuria Ext: No pitting leg edema bilaterally. 1+DP/PT pulse bilaterally. Musculoskeletal: No  joint deformities, No joint redness or warmth, no limitation of ROM in spin. Skin: has He has a small painful indurated area to the right lateral lower leg, no drainage, no skin Neuro: Alert, oriented X3, cranial nerves II-XII grossly intact, except for left facial droop. Muscle strength 1-2 in left arm and leg and 5/5 in right extremities, sensation to light touch intact.  Psych: Patient is not psychotic, no suicidal or hemocidal ideation.  Labs on Admission: I have personally reviewed following labs and imaging studies  CBC: Recent Labs  Lab 05/19/22 0523  WBC 12.2*  NEUTROABS 8.2*  HGB 14.1  HCT 43.9  MCV 79.8*  PLT 269   Basic Metabolic Panel: Recent Labs  Lab 05/19/22 0523  NA 142  K 3.8  CL 108  CO2 23  GLUCOSE 100*  BUN 23*  CREATININE 0.97  CALCIUM 9.4   GFR: Estimated Creatinine Clearance: 107.7 mL/min (by C-G formula based on SCr of 0.97 mg/dL). Liver Function Tests: Recent Labs  Lab 05/19/22 0523  AST 26  ALT 19  ALKPHOS 75  BILITOT 0.9  PROT 8.0  ALBUMIN  4.2   No results for input(s): "LIPASE", "AMYLASE" in the last 168 hours. No results for input(s): "AMMONIA" in the last 168 hours. Coagulation Profile: Recent Labs  Lab 05/19/22 0523  INR 1.1   Cardiac Enzymes: No results for input(s): "CKTOTAL", "CKMB", "CKMBINDEX", "TROPONINI" in the last 168 hours. BNP (last 3 results) No results for input(s): "PROBNP" in the last 8760 hours. HbA1C: No results for input(s): "HGBA1C" in the last 72 hours. CBG: Recent Labs  Lab 05/19/22 0525  GLUCAP 103*   Lipid Profile: No results for input(s): "CHOL", "HDL", "LDLCALC", "TRIG", "CHOLHDL", "LDLDIRECT" in the last 72 hours. Thyroid Function Tests: No results for input(s): "TSH", "T4TOTAL", "FREET4", "T3FREE", "THYROIDAB" in the last 72 hours. Anemia Panel: No results for input(s): "VITAMINB12", "FOLATE", "FERRITIN", "TIBC", "IRON", "RETICCTPCT" in the last 72 hours. Urine analysis:    Component Value Date/Time   COLORURINE YELLOW (A) 05/19/2022 1300   APPEARANCEUR CLEAR (A) 05/19/2022 1300   LABSPEC 1.025 05/19/2022 1300   PHURINE 5.0 05/19/2022 1300   GLUCOSEU NEGATIVE 05/19/2022 1300   HGBUR SMALL (A) 05/19/2022 1300   BILIRUBINUR NEGATIVE 05/19/2022 1300   KETONESUR NEGATIVE 05/19/2022 1300   PROTEINUR NEGATIVE 05/19/2022 1300   NITRITE NEGATIVE 05/19/2022 1300   LEUKOCYTESUR NEGATIVE 05/19/2022 1300   Sepsis Labs: '@LABRCNTIP'$ (procalcitonin:4,lacticidven:4) )No results found for this or any previous visit (from the past 240 hour(s)).   Radiological Exams on Admission: MR BRAIN WO CONTRAST  Result Date: 05/19/2022 CLINICAL DATA:  62 year old male code stroke presentation with left side weakness, slurred speech, facial droop. EXAM: MRI HEAD WITHOUT CONTRAST TECHNIQUE: Multiplanar, multiecho pulse sequences of the brain and surrounding structures were obtained without intravenous contrast. COMPARISON:  Plain head CT 0518 hours today.  Brain MRI 11/08/2021. FINDINGS: Brain: Cerebral  volume remains normal for age. No restricted diffusion to suggest acute infarction. No midline shift, mass effect, evidence of mass lesion, ventriculomegaly, extra-axial collection or acute intracranial hemorrhage. Cervicomedullary junction and pituitary are within normal limits. Pearline Cables and white matter signal remains within normal limits throughout the brain. No convincing encephalomalacia. No chronic cerebral blood products on SWI. 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 Deep gray matter nuclei, brainstem, and cerebellum appear negative. Vascular: Major intracranial vascular flow  voids are stable. Skull and upper cervical spine: Negative, visualized bone marrow signal is within normal limits. Sinuses/Orbits: Negative orbits. Mild paranasal sinus mucosal thickening appears stable or improved from last year. Other: Mastoids remain well aerated. Grossly normal visible internal auditory structures. Negative stylomastoid foramina, visible scalp and face. IMPRESSION: Continued normal for age noncontrast MRI appearance of the brain. Electronically Signed   By: Genevie Ann M.D.   On: 05/19/2022 07:04   CT HEAD CODE STROKE WO CONTRAST  Addendum Date: 05/19/2022   ADDENDUM REPORT: 05/19/2022 05:38 ADDENDUM: Study discussed by telephone with Dr. Luvenia Starch SUNG on 05/19/2022 at 0531 hours. Electronically Signed   By: Genevie Ann M.D.   On: 05/19/2022 05:38   Result Date: 05/19/2022 CLINICAL DATA:  Code stroke. 62 year old male with left side weakness, slurred speech, facial droop. EXAM: CT HEAD WITHOUT CONTRAST TECHNIQUE: Contiguous axial images were obtained from the base of the skull through the vertex without intravenous contrast. RADIATION DOSE REDUCTION: This exam was performed according to the departmental dose-optimization program which includes automated exposure  control, adjustment of the mA and/or kV according to patient size and/or use of iterative reconstruction technique. COMPARISON:  Brain MRI and head CT 11/08/2021. FINDINGS: Brain: Cerebral volume remains normal for age. No midline shift, ventriculomegaly, mass effect, evidence of mass lesion, intracranial hemorrhage or evidence of cortically based acute infarction. Gray-white matter differentiation is within normal limits throughout the brain. Vascular: Stable. Unchanged asymmetric density of a right MCA branch in the sylvian fissure on series 3, image 12, was negative last year. No suspicious intracranial vascular hyperdensity. Skull: Chronic appearing deformity of the left maxillary sinus. No acute osseous abnormality identified. Sinuses/Orbits: Visualized paranasal sinuses and mastoids are clear. Other: Orbit and scalp soft tissues remain negative. ASPECTS [facility] Stroke Program Early CT Score) Total score (0-10 with 10 being normal): 10 IMPRESSION: Stable and normal for age noncontrast CT appearance of the brain. ASPECTS 10. Electronically Signed: By: Genevie Ann M.D. On: 05/19/2022 05:28      Assessment/Plan Principal Problem:   Left-sided weakness Active Problems:   History of stroke   HLD (hyperlipidemia)   HTN (hypertension)   Personality disorder (HCC)   Depression with anxiety   Cocaine abuse (HCC)   Tobacco abuse   Spider bite_to right lower lateral leg   Obesity (BMI 30-39.9)   Assessment and Plan:  Left-sided weakness: Etiology is not clear.  CT of the head and MRI for brain is negative for acute intracranial issues.  Consulted Dr. Leonel Ramsay of neurology. He suspects nonorganic etiology, such as conversion disorder.   -will place in tele bed as inpt -Frequent neurochecks -PT/OT -Will consult psychiatrist: Message sent to Dr. Louis Meckel and Gust Rung -f/u Dr. Cecil Cobbs recommendations as follows: Recommendations: 1) EEG 2) Reglan 10 mg IV x 1 3) if EEG is negative, no  further neurodiagnostic testing is needed 4) could consider PT/OT, psychiatric evaluation  History of stroke: pt had tPA treatment in [facility] -lipitor, ASA  HLD (hyperlipidemia) -Lipitor  Hypertension: -IV hydralazine as needed -Amlodipine, lisinopril,  Personality disorder, depression with anxiety: Denies suicidal homicidal ideations. -continue home meds  Cocaine abuse, and Tobacco abuse -Did counseling about importance of quitting substance use -Check UDS -Nicotine patch  Spider bite_to right lower lateral leg:  pt has a small painful indurated area to the right lateral lower leg, no drainage. Pt has mild leukocytosis with WBC 12.2.  No fever. -Start doxycycline empirically  Obesity (BMI 30-39.9): Body weight by 15.2 kg, BMI 33.51 -  Exercise and healthy diet -Encouraged losing weight    DVT ppx:   SQ Lovenox  Code Status: Full code  Family Communication: not done, no family member is at bed side.    Disposition Plan:  Anticipate discharge back to previous environment  Consults called:  Dr. Leonel Ramsay of neurology. Message sent to Dr. Louis Meckel and Gust Rung of [facility]  Admission status and Level of care: Telemetry Medical:     for obs   Dispo: The patient is from: Home              Anticipated d/c is to: Home              Anticipated d/c date is: 1 day              Patient currently is not medically stable to d/c.    Severity of Illness:  The appropriate patient status for this patient is OBSERVATION. Observation status is judged to be reasonable and necessary in order to provide the required intensity of service to ensure the patient's safety. The patient's presenting symptoms, physical exam findings, and initial radiographic and laboratory data in the context of their medical condition is felt to place them at decreased risk for further clinical deterioration. Furthermore, it is anticipated that the patient will be medically stable for discharge from the hospital within  2 midnights of admission.        Date of Service 05/19/2022    [facility]   If 7PM-7AM, please contact night-coverage [email or web address] 05/19/2022, 2:53 PM

## 2022-05-20 ENCOUNTER — Encounter: Payer: Self-pay | Admitting: Internal Medicine

## 2022-05-20 DIAGNOSIS — I10 Essential (primary) hypertension: Secondary | ICD-10-CM | POA: Diagnosis not present

## 2022-05-20 DIAGNOSIS — R531 Weakness: Secondary | ICD-10-CM

## 2022-05-20 DIAGNOSIS — E785 Hyperlipidemia, unspecified: Secondary | ICD-10-CM | POA: Diagnosis not present

## 2022-05-20 DIAGNOSIS — F1414 Cocaine abuse with cocaine-induced mood disorder: Secondary | ICD-10-CM | POA: Diagnosis not present

## 2022-05-20 LAB — LIPID PANEL
Cholesterol: 172 mg/dL (ref 0–200)
HDL: 38 mg/dL — ABNORMAL LOW (ref >40)
LDL Cholesterol: 108 mg/dL — ABNORMAL HIGH (ref 0–99)
Total CHOL/HDL Ratio: 4.5 RATIO
Triglycerides: 130 mg/dL (ref <150)
VLDL: 26 mg/dL (ref 0–40)

## 2022-05-20 MED ORDER — DOXYCYCLINE HYCLATE 100 MG PO TABS
100.0000 mg | ORAL_TABLET | Freq: Two times a day (BID) | ORAL | Status: DC
  Administered 2022-05-20 – 2022-05-21 (×2): 100 mg via ORAL
  Filled 2022-05-20 (×2): qty 1

## 2022-05-20 NOTE — Consult Note (Signed)
[facility] Face-to-Face Psychiatry Consult   Reason for Consult:  cocaine abuse, depression Referring Physician:  EDP Patient Identification: Samuel Molina MRN:  [id number] Principal Diagnosis: Cocaine abuse with cocaine induced mood disorder Diagnosis:  Active Problems:   Cocaine abuse with cocaine-induced mood disorder ([name])   Cocaine abuse ([name])   Personality disorder ([name])   History of stroke   HLD (hyperlipidemia)   Tobacco abuse   Left-sided weakness   Depression with anxiety   Obesity (BMI 30-39.9)   Spider bite_to right lower lateral leg   HTN (hypertension)   Total Time spent with patient: 45 minutes  Subjective:   Samuel Molina is a 62 y.o. male patient admitted with rule out stroke.Marland Kitchen  HPI:  62 yo male admitted for questionable stroke after cocaine use.  On assessment, he reported "I'm ok."  Depression is "somewhat" with no suicidal ideations or homicidal ideations.  No anxiety.  Denies psychosis.  He is interested in inpatient detox, explained there is not one for cocaine and will double check.  There is not.  Resources for [name] for substance abuse.  Psych cleared for outpatient follow up.  Past Psychiatric History: cocaine use disorder, depression, anxiety  Risk to Self:  none Risk to Others:  none Prior Inpatient Therapy:  several Prior Outpatient Therapy:  none  Past Medical History:  Past Medical History:  Diagnosis Date   Anxiety    Depression    GERD (gastroesophageal reflux disease)    Obesity    Stroke ([name])    Substance abuse ([name])     Past Surgical History:  Procedure Laterality Date   COLONOSCOPY WITH PROPOFOL N/A 11/23/2021   Procedure: COLONOSCOPY WITH PROPOFOL;  Surgeon: [name], MD;  Location: [facility] ENDOSCOPY;  Service: Gastroenterology;  Laterality: N/A;   ESOPHAGOGASTRODUODENOSCOPY N/A 11/23/2021   Procedure: ESOPHAGOGASTRODUODENOSCOPY (EGD);  Surgeon: [name], MD;  Location[facility] ENDOSCOPY;  Service: Gastroenterology;   Laterality: N/A;   ESOPHAGOGASTRODUODENOSCOPY (EGD) WITH PROPOFOL N/A 09/20/2017   Procedure: ESOPHAGOGASTRODUODENOSCOPY (EGD) WITH PROPOFOL;  Surgeon: Lin Landsman, MD;  Location: [facility] ENDOSCOPY;  Service: Gastroenterology;  Laterality: N/A;   KNEE SURGERY Right    TEE WITHOUT CARDIOVERSION N/A 11/15/2021   Procedure: TRANSESOPHAGEAL ECHOCARDIOGRAM (TEE);  Surgeon: Minna Merritts, MD;  Location: [facility] ORS;  Service: Cardiovascular;  Laterality: N/A;   Family History:  Family History  Problem Relation Age of Onset   Vasculitis Mother    Family Psychiatric  History: none Social History:  Social History   Substance and Sexual Activity  Alcohol Use Not Currently     Social History   Substance and Sexual Activity  Drug Use Yes   Types: Cocaine   Comment: heroin    Social History   Socioeconomic History   Marital status: Single    Spouse name: Not on file   Number of children: Not on file   Years of education: Not on file   Highest education level: Not on file  Occupational History   Not on file  Tobacco Use   Smoking status: Every Day    Packs/day: 0.50    Types: Cigarettes   Smokeless tobacco: Never  Vaping Use   Vaping Use: Never used  Substance and Sexual Activity   Alcohol use: Not Currently   Drug use: Yes    Types: Cocaine    Comment: heroin   Sexual activity: Not Currently  Other Topics Concern   Not on file  Social History Narrative   Not on file  Social Determinants of Health   Financial Resource Strain: Not on file  Food Insecurity: Unknown (05/19/2022)   Hunger Vital Sign    Worried About Running Out of Food in the Last Year: Patient refused    [place] in the Last Year: Patient refused  Transportation Needs: Unknown (05/19/2022)   PRAPARE - Hydrologist (Medical): Patient refused    Lack of Transportation (Non-Medical): Patient refused  Physical Activity: Not on file  Stress: Not on file  Social  Connections: Not on file   Additional Social History:    Allergies:   Allergies  Allergen Reactions   Acetaminophen Itching, Nausea And Vomiting and Rash    Labs:  Results for orders placed or performed during the hospital encounter of 05/19/22 (from the past 48 hour(s))  Ethanol     Status: None   Collection Time: 05/19/22  5:23 AM  Result Value Ref Range   Alcohol, Ethyl (B) <10 <10 mg/dL    Comment: (NOTE) Lowest detectable limit for serum alcohol is 10 mg/dL.  For medical purposes only. Performed at [facility], [address] [place], [place] [address]   Protime-INR     Status: None   Collection Time: 05/19/22  5:23 AM  Result Value Ref Range   Prothrombin Time 13.6 11.4 - 15.2 seconds   INR 1.1 0.8 - 1.2    Comment: (NOTE) INR goal varies based on device and disease states. Performed at [facility], [address] [place], [place] [address]   APTT     Status: None   Collection Time: 05/19/22  5:23 AM  Result Value Ref Range   aPTT 26 24 - 36 seconds    Comment: Performed at [facility], [address] [place], [place] [address]  CBC     Status: Abnormal   Collection Time: 05/19/22  5:23 AM  Result Value Ref Range   WBC 12.2 (H) 4.0 - 10.5 K/uL   RBC 5.50 4.22 - 5.81 MIL/uL   Hemoglobin 14.1 13.0 - 17.0 g/dL   HCT 43.9 39.0 - 52.0 %   MCV 79.8 (L) 80.0 - 100.0 fL   MCH 25.6 (L) 26.0 - 34.0 pg   MCHC 32.1 30.0 - 36.0 g/dL   RDW 13.6 11.5 - 15.5 %   Platelets 306 150 - 400 K/uL   nRBC 0.0 0.0 - 0.2 %    Comment: Performed at [facility], [address] [place], [place] [address]  Differential     Status: Abnormal   Collection Time: 05/19/22  5:23 AM  Result Value Ref Range   Neutrophils Relative % 67 %   Neutro Abs 8.2 (H) 1.7 - 7.7 K/uL   Lymphocytes Relative 22 %   Lymphs Abs 2.6 0.7 - 4.0 K/uL   Monocytes Relative 11 %   Monocytes Absolute 1.3 (H) 0.1 - 1.0 K/uL   Eosinophils Relative 0 %    Eosinophils Absolute 0.0 0.0 - 0.5 K/uL   Basophils Relative 0 %   Basophils Absolute 0.0 0.0 - 0.1 K/uL   Immature Granulocytes 0 %   Abs Immature Granulocytes 0.03 0.00 - 0.07 K/uL    Comment: Performed at [facility], [address] [place],  [address]  Comprehensive metabolic panel     Status: Abnormal   Collection Time: 05/19/22  5:23 AM  Result Value Ref Range   Sodium 142 135 - 145 mmol/L   Potassium 3.8 3.5 -  5.1 mmol/L   Chloride 108 98 - 111 mmol/L   CO2 23 22 - 32 mmol/L   Glucose, Bld 100 (H) 70 - 99 mg/dL    Comment: Glucose reference range applies only to samples taken after fasting for at least 8 hours.   BUN 23 (H) 6 - 20 mg/dL   Creatinine, Ser 0.97 0.61 - 1.24 mg/dL   Calcium 9.4 8.9 - 10.3 mg/dL   Total Protein 8.0 6.5 - 8.1 g/dL   Albumin 4.2 3.5 - 5.0 g/dL   AST 26 15 - 41 U/L   ALT 19 0 - 44 U/L   Alkaline Phosphatase 75 38 - 126 U/L   Total Bilirubin 0.9 0.3 - 1.2 mg/dL   GFR, Estimated >60 >60 mL/min    Comment: (NOTE) Calculated using the CKD-EPI Creatinine Equation (2021)    Anion gap 11 5 - 15    Comment: Performed at [facility], [address] [place], [place] [address]  Hemoglobin A1c     Status: Abnormal   Collection Time: 05/19/22  5:23 AM  Result Value Ref Range   Hgb A1c MFr Bld 5.7 (H) 4.8 - 5.6 %    Comment: (NOTE) Pre diabetes:          5.7%-6.4%  Diabetes:              >6.4%  Glycemic control for   <7.0% adults with diabetes    Mean Plasma Glucose 116.89 mg/dL    Comment: Performed at [facility] [address], [place], [place] [address]  Sedimentation rate     Status: None   Collection Time: 05/19/22  5:23 AM  Result Value Ref Range   Sed Rate 19 0 - 20 mm/hr    Comment: Performed at [facility], [address] [place], [place] [address]  CBG monitoring, ED     Status: Abnormal   Collection Time: 05/19/22  5:25 AM  Result Value Ref Range   Glucose-Capillary 103 (H) 70 - 99  mg/dL    Comment: Glucose reference range applies only to samples taken after fasting for at least 8 hours.  Culture, blood (Routine X 2) w Reflex to ID Panel     Status: None (Preliminary result)   Collection Time: 05/19/22 10:00 AM   Specimen: BLOOD  Result Value Ref Range   Specimen Description BLOOD BLOOD LEFT ARM    Special Requests      BOTTLES DRAWN AEROBIC AND ANAEROBIC Blood Culture adequate volume   Culture      NO GROWTH < 24 HOURS Performed at [facility], [address], [place], [place] [address]    Report Status PENDING   Culture, blood (Routine X 2) w Reflex to ID Panel     Status: None (Preliminary result)   Collection Time: 05/19/22 10:00 AM   Specimen: BLOOD  Result Value Ref Range   Specimen Description BLOOD BLOOD LEFT HAND    Special Requests      BOTTLES DRAWN AEROBIC AND ANAEROBIC Blood Culture adequate volume   Culture      NO GROWTH < 24 HOURS Performed at [facility], [address] [place], [place] [address]    Report Status PENDING   C-reactive protein     Status: None   Collection Time: 05/19/22 10:00 AM  Result Value Ref Range   CRP 0.8 <1.0 mg/dL    Comment: Performed at [facility], [place] [address], [place], [place]  [address]  Urine Drug Screen, Qualitative     Status: Abnormal   Collection Time: 05/19/22  1:00 PM  Result Value Ref Range   Tricyclic, Ur Screen NONE DETECTED NONE DETECTED   Amphetamines, Ur Screen NONE DETECTED NONE DETECTED   MDMA (Ecstasy)Ur Screen NONE DETECTED NONE DETECTED   Cocaine Metabolite,Ur [place] POSITIVE (A) NONE DETECTED   Opiate, Ur Screen NONE DETECTED NONE DETECTED   Phencyclidine (PCP) Ur S NONE DETECTED NONE DETECTED   Cannabinoid 50 Ng, Ur [place] NONE DETECTED NONE DETECTED   Barbiturates, Ur Screen NONE DETECTED NONE DETECTED   Benzodiazepine, Ur Scrn NONE DETECTED NONE DETECTED   Methadone Scn, Ur NONE DETECTED NONE DETECTED    Comment: (NOTE) Tricyclics + metabolites, urine     Cutoff 1000 ng/mL Amphetamines + metabolites, urine  Cutoff 1000 ng/mL MDMA (Ecstasy), urine              Cutoff 500 ng/mL Cocaine Metabolite, urine          Cutoff 300 ng/mL Opiate + metabolites, urine        Cutoff 300 ng/mL Phencyclidine (PCP), urine         Cutoff 25 ng/mL Cannabinoid, urine                 Cutoff 50 ng/mL Barbiturates + metabolites, urine  Cutoff 200 ng/mL Benzodiazepine, urine              Cutoff 200 ng/mL Methadone, urine                   Cutoff 300 ng/mL  The urine drug screen provides only a preliminary, unconfirmed analytical test result and should not be used for non-medical purposes. Clinical consideration and professional judgment should be applied to any positive drug screen result due to possible interfering substances. A more specific alternate chemical method must be used in order to obtain a confirmed analytical result. Gas chromatography / mass spectrometry (GC/MS) is the preferred confirm atory method. Performed at [facility], [address] [place], [place] [address]   Urinalysis, Routine w reflex microscopic -Urine, Clean Catch     Status: Abnormal   Collection Time: 05/19/22  1:00 PM  Result Value Ref Range   Color, Urine YELLOW (A) YELLOW   APPearance CLEAR (A) CLEAR   Specific Gravity, Urine 1.025 1.005 - 1.030   pH 5.0 5.0 - 8.0   Glucose, UA NEGATIVE NEGATIVE mg/dL   Hgb urine dipstick SMALL (A) NEGATIVE   Bilirubin Urine NEGATIVE NEGATIVE   Ketones, ur NEGATIVE NEGATIVE mg/dL   Protein, ur NEGATIVE NEGATIVE mg/dL   Nitrite NEGATIVE NEGATIVE   Leukocytes,Ua NEGATIVE NEGATIVE   RBC / HPF 0-5 0 - 5 RBC/hpf   WBC, UA 0-5 0 - 5 WBC/hpf   Bacteria, UA NONE SEEN NONE SEEN   Squamous Epithelial / HPF 0-5 0 - 5 /HPF   Mucus PRESENT     Comment: Performed at [facility], [address] [place], [place] [address]  Lipid panel     Status: Abnormal   Collection Time: 05/20/22  5:44 AM  Result Value Ref Range    Cholesterol 172 0 - 200 mg/dL   Triglycerides 130 <150 mg/dL   HDL 38 (L) >40 mg/dL   Total CHOL/HDL Ratio 4.5 RATIO   VLDL 26 0 - 40 mg/dL   LDL Cholesterol 108 (H) 0 - 99 mg/dL    Comment:        Total Cholesterol/HDL:CHD Risk  Coronary Heart Disease Risk Table                     Men   Women  1/2 Average Risk   3.4   3.3  Average Risk       5.0   4.4  2 X Average Risk   9.6   7.1  3 X Average Risk  23.4   11.0        Use the calculated Patient Ratio above and the CHD Risk Table to determine the patient's CHD Risk.        ATP III CLASSIFICATION (LDL):  <100     mg/dL   Optimal  100-129  mg/dL   Near or Above                    Optimal  130-159  mg/dL   Borderline  160-189  mg/dL   High  >190     mg/dL   Very High Performed at [facility], [address] [place], [place] [address]     Current Facility-Administered Medications  Medication Dose Route Frequency Provider Last Rate Last Admin   amLODipine (NORVASC) tablet 10 mg  10 mg Oral Daily Ivor Costa, MD   10 mg at 05/19/22 1639   aspirin EC tablet 81 mg  81 mg Oral q morning Ivor Costa, MD   81 mg at 05/20/22 0109   atorvastatin (LIPITOR) tablet 80 mg  80 mg Oral QPM Ivor Costa, MD   80 mg at 05/19/22 1639   benzonatate (TESSALON) capsule 100 mg  100 mg Oral TID PRN Ivor Costa, MD       citalopram (CELEXA) tablet 10 mg  10 mg Oral Daily Ivor Costa, MD   10 mg at 05/20/22 0834   doxycycline (VIBRA-TABS) tablet 100 mg  100 mg Oral Q12H Fritzi Mandes, MD       enoxaparin (LOVENOX) injection 57.5 mg  0.5 mg/kg Subcutaneous Q24H Ivor Costa, MD   57.5 mg at 05/20/22 3235   hydrOXYzine (ATARAX) tablet 100 mg  100 mg Oral Q8H PRN Ivor Costa, MD       ibuprofen (ADVIL) tablet 200 mg  200 mg Oral Q6H PRN Ivor Costa, MD       lisinopril (ZESTRIL) tablet 40 mg  40 mg Oral Daily Ivor Costa, MD   40 mg at 05/19/22 1639   methocarbamol (ROBAXIN) tablet 500 mg  500 mg Oral Q8H PRN Ivor Costa, MD   500 mg at 05/19/22 2031    mirtazapine (REMERON) tablet 15 mg  15 mg Oral QHS Ivor Costa, MD   15 mg at 05/19/22 2031   nicotine (NICODERM CQ - dosed in mg/24 hours) patch 21 mg  21 mg Transdermal Daily Ivor Costa, MD       ondansetron [facility]) injection 4 mg  4 mg Intravenous Q8H PRN Ivor Costa, MD       pantoprazole (PROTONIX) EC tablet 40 mg  40 mg Oral BID Ivor Costa, MD   40 mg at 05/20/22 5732   QUEtiapine (SEROQUEL) tablet 100 mg  100 mg Oral QHS Ivor Costa, MD   100 mg at 05/19/22 2031   senna-docusate (Senokot-S) tablet 1 tablet  1 tablet Oral QHS PRN Ivor Costa, MD       traMADol Veatrice Bourbon) tablet 50 mg  50 mg Oral Q6H PRN Ivor Costa, MD   50 mg at 05/19/22 2031    Musculoskeletal: Strength & Muscle Tone:  decreased Gait & Station:  did not witness Patient leans: N/A  Psychiatric Specialty Exam: Physical Exam Vitals and nursing note reviewed.  Constitutional:      Appearance: Normal appearance.  HENT:     Head: Normocephalic.     Nose: Nose normal.  Pulmonary:     Effort: Pulmonary effort is normal.  Musculoskeletal:     Cervical back: Normal range of motion.  Neurological:     General: No focal deficit present.     Mental Status: He is alert and oriented to person, place, and time.  Psychiatric:        Attention and Perception: Attention and perception normal.        Mood and Affect: Mood is depressed.        Speech: Speech normal.        Behavior: Behavior normal. Behavior is cooperative.        Thought Content: Thought content normal.        Judgment: Judgment normal.     Review of Systems  Psychiatric/Behavioral:  Positive for depression and substance abuse.   All other systems reviewed and are negative.   Blood pressure 114/62, pulse (!) 54, temperature 97.6 F (36.4 C), resp. rate 16, height '6\' 1"'$  (1.854 m), weight 115.2 kg, SpO2 97 %.Body mass index is 33.51 kg/m.  General Appearance: Casual  Eye Contact:  Good  Speech:  Normal Rate  Volume:  Normal  Mood:  Depressed  Affect:   Congruent  Thought Process:  Coherent  Orientation:  Full (Time, Place, and Person)  Thought Content:  WDL and Logical  Suicidal Thoughts:  No  Homicidal Thoughts:  No  Memory:  Immediate;   Good Recent;   Good Remote;   Good  Judgement:  Fair  Insight:  Fair  Psychomotor Activity:  Decreased  Concentration:  Concentration: Good and Attention Span: Good  Recall:  Good  Fund of Knowledge:  Good  Language:  Good  Akathisia:  No  Handed:  Right  AIMS (if indicated):     Assets:  Leisure Time Resilience  ADL's:  Intact  Cognition:  WNL  Sleep:        Physical Exam: Physical Exam Vitals and nursing note reviewed.  Constitutional:      Appearance: Normal appearance.  HENT:     Head: Normocephalic.     Nose: Nose normal.  Pulmonary:     Effort: Pulmonary effort is normal.  Musculoskeletal:     Cervical back: Normal range of motion.  Neurological:     General: No focal deficit present.     Mental Status: He is alert and oriented to person, place, and time.  Psychiatric:        Attention and Perception: Attention and perception normal.        Mood and Affect: Mood is depressed.        Speech: Speech normal.        Behavior: Behavior normal. Behavior is cooperative.        Thought Content: Thought content normal.        Judgment: Judgment normal.    Review of Systems  Psychiatric/Behavioral:  Positive for depression and substance abuse.   All other systems reviewed and are negative.  Blood pressure 114/62, pulse (!) 54, temperature 97.6 F (36.4 C), resp. rate 16, height '6\' 1"'$  (1.854 m), weight 115.2 kg, SpO2 97 %. Body mass index is 33.51 kg/m.  Treatment Plan Summary: Daily contact with patient to assess and evaluate  symptoms and progress in treatment, Medication management, and Plan : Cocaine abuse with cocaine induced mood disorder: Resources for [facility] and [facility] in Discharge instrustions  Disposition: No evidence of imminent risk to self or others at  present.   Patient does not meet criteria for psychiatric inpatient admission. Supportive therapy provided about ongoing stressors.  Waylan Boga, NP 05/20/2022 9:24 AM

## 2022-05-20 NOTE — Progress Notes (Signed)
[facility] [place] NAME: Samuel Molina    MR#:  [id number]  DATE OF BIRTH:  1960/12/04  SUBJECTIVE:  per staff patient has been denying to where telemetry, refused physical therapy. Tells me he has stroke and everyone is saying he doesn't have it.  No headache today. Wants a new neurologist. Told him we have only one neurologist on call. He says he doesn't want it. Agreeable to get EEG on Monday.    VITALS:  Blood pressure 114/62, pulse (!) 54, temperature 97.6 F (36.4 C), resp. rate 16, height '6\' 1"'$  (1.854 m), weight 115.2 kg, SpO2 97 %.  PHYSICAL EXAMINATION:   GENERAL:  62 y.o.-year-old patient with no acute distress. Chronic left facial palsy LUNGS: Normal breath sounds bilaterally, no wheezing CARDIOVASCULAR: S1, S2 normal. No murmur   ABDOMEN: Soft, nontender, nondistended. Bowel sounds present.  EXTREMITIES: No  edema b/l.    NEUROLOGIC:  patient is alert and awake. Appears to tighten his left upper and lower extremity and resist when trying to do neuro- exam. Able to ambulate in the room using walker without any difficulty.   LABORATORY PANEL:  CBC Recent Labs  Lab 05/19/22 0523  WBC 12.2*  HGB 14.1  HCT 43.9  PLT 306    Chemistries  Recent Labs  Lab 05/19/22 0523  NA 142  K 3.8  CL 108  CO2 23  GLUCOSE 100*  BUN 23*  CREATININE 0.97  CALCIUM 9.4  AST 26  ALT 19  ALKPHOS 75  BILITOT 0.9   Cardiac Enzymes No results for input(s): "TROPONINI" in the last 168 hours. RADIOLOGY:  MR BRAIN WO CONTRAST  Result Date: 05/19/2022 CLINICAL DATA:  62 year old male code stroke presentation with left side weakness, slurred speech, facial droop. EXAM: MRI HEAD WITHOUT CONTRAST TECHNIQUE: Multiplanar, multiecho pulse sequences of the brain and surrounding structures were obtained without intravenous contrast. COMPARISON:  Plain head CT 0518 hours today.  Brain MRI 11/08/2021. FINDINGS: Brain: Cerebral volume  remains normal for age. No restricted diffusion to suggest acute infarction. No midline shift, mass effect, evidence of mass lesion, ventriculomegaly, extra-axial collection or acute intracranial hemorrhage. Cervicomedullary junction and pituitary are within normal limits. Pearline Cables and white matter signal remains within normal limits throughout the brain. No convincing encephalomalacia. No chronic cerebral blood products on SWI. 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 Deep gray matter nuclei, brainstem, and cerebellum appear negative. Vascular: Major intracranial vascular flow voids are stable. Skull and upper cervical spine: Negative, visualized bone marrow signal is within normal limits. Sinuses/Orbits: Negative orbits. Mild paranasal sinus mucosal thickening appears stable or improved from last year. Other: Mastoids remain well aerated. Grossly normal visible internal auditory structures. Negative stylomastoid foramina, visible scalp and face. IMPRESSION: Continued normal for age noncontrast MRI appearance of the brain. Electronically Signed   By: Genevie Ann M.D.   On: 05/19/2022 07:04   CT HEAD CODE STROKE WO CONTRAST  Addendum Date: 05/19/2022   ADDENDUM REPORT: 05/19/2022 05:38 ADDENDUM: Study discussed by telephone with Dr. Luvenia Starch SUNG on 05/19/2022 at 0531 hours. Electronically Signed   By: Genevie Ann M.D.   On: 05/19/2022 05:38   Result Date: 05/19/2022 CLINICAL DATA:  Code stroke. 62 year old male with left side weakness, slurred speech, facial droop. EXAM: CT HEAD WITHOUT CONTRAST TECHNIQUE: Contiguous axial images  were obtained from the base of the skull through the vertex without intravenous contrast. RADIATION DOSE REDUCTION: This exam was performed according to the departmental dose-optimization program which includes automated exposure control,  adjustment of the mA and/or kV according to patient size and/or use of iterative reconstruction technique. COMPARISON:  Brain MRI and head CT 11/08/2021. FINDINGS: Brain: Cerebral volume remains normal for age. No midline shift, ventriculomegaly, mass effect, evidence of mass lesion, intracranial hemorrhage or evidence of cortically based acute infarction. Gray-white matter differentiation is within normal limits throughout the brain. Vascular: Stable. Unchanged asymmetric density of a right MCA branch in the sylvian fissure on series 3, image 12, was negative last year. No suspicious intracranial vascular hyperdensity. Skull: Chronic appearing deformity of the left maxillary sinus. No acute osseous abnormality identified. Sinuses/Orbits: Visualized paranasal sinuses and mastoids are clear. Other: Orbit and scalp soft tissues remain negative. ASPECTS [facility] Stroke Program Early CT Score) Total score (0-10 with 10 being normal): 10 IMPRESSION: Stable and normal for age noncontrast CT appearance of the brain. ASPECTS 10. Electronically Signed: By: Genevie Ann M.D. On: 05/19/2022 05:28    Assessment and Plan   [name] is a 62 y.o. male with medical history significant of stroke, TIA, hypertension, hyperlipidemia, depression with anxiety, personality disorder, tobacco abuse, cocaine abuse, rectal bleeding, left side Bell's palsy, who presents with left sided weakness, slurred speech, facial droop.    Left-sided weakness: chronic.  -- Patient has had extensive workup here and at [facility] with multiple imaging studies revealing no stroke. Patient is fixated he has hemorrhage in the brain. He even told me he had tPA in 2022 at [facility] which was incorrect after I reviewed [facility] records. -- CT of the head and MRI for brain is negative for acute intracranial issues.  -- Consulted Dr. Leonel Ramsay of neurology. He suspects nonorganic etiology, such as conversion disorder. pt does not want to follow up with  Neurology --EEG--low yield per Neuro. Defer as out pt -- does not want to participate with PT OT. Ambulates using walker in the room. --pt does not have CVA on imaging studies. Per [facility] notes pt did not receive TPA in 06/2020. --pt received TPA in 2021 at [facility]-- CT head at that time did not show CVA    HLD (hyperlipidemia) -Lipitor   Hypertension: -IV hydralazine as needed -Amlodipine, lisinopril   Personality disorder, depression with anxiety: Denies suicidal homicidal ideations. -continue home meds  -- seen by psychiatry. ---- Has underlying issue with malingering/conversion disorder.(Old records)  Cocaine abuse, and Tobacco abuse -Did counseling about importance of quitting substance use   Spider bite_to right lower lateral leg:  pt has a small painful indurated area to the right lateral lower leg, no drainage. Pt has mild leukocytosis with WBC 12.2.  No fever. -Start doxycycline empirically   Obesity (BMI 30-39.9): Body weight by 15.2 kg, BMI 33.51 -Exercise and healthy diet -Encouraged losing weight       DVT ppx:   SQ Lovenox   Code Status: Full code  Procedures: Family communication : none Consults : neurology CODE STATUS full code DVT Prophylaxis : Lovenox Level of care: Telemetry Medical Status is: Observation The patient remains OBS appropriate and will d/c before 2 midnights.   Discussed with patient stroke workup is negative. Will get EEG done on Monday if he complies to it and if remains negative he will be discharged home with home health.  TOTAL TIME TAKING CARE OF THIS PATIENT: 35 minutes.  >50% time  spent on counselling and coordination of care  Note: This dictation was prepared with Dragon dictation along with smaller phrase technology. Any transcriptional errors that result from this process are unintentional.  Fritzi Mandes M.D    [facility]   CC: Primary care physician; System, Provider Not In

## 2022-05-20 NOTE — Plan of Care (Signed)
  Problem: Coping: Goal: Will verbalize positive feelings about self Outcome: Progressing Goal: Will identify appropriate support needs Outcome: Progressing   Problem: Health Behavior/Discharge Planning: Goal: Ability to manage health-related needs will improve Outcome: Progressing   Problem: Self-Care: Goal: Ability to participate in self-care as condition permits will improve Outcome: Progressing   Problem: Nutrition: Goal: Risk of aspiration will decrease Outcome: Progressing   Problem: Education: Goal: Knowledge of General Education information will improve Description: Including pain rating scale, medication(s)/side effects and non-pharmacologic comfort measures Outcome: Progressing

## 2022-05-20 NOTE — Progress Notes (Signed)
PT Cancellation Note  Patient Details Name: Samuel Molina MRN: [id number] DOB: 05/21/1960   Cancelled Treatment:    Reason Eval/Treat Not Completed:  (Consult received and chart reviewed.  L sidelying upon arrival to room.  Introduced self and role; per patient, "is it possible that we could do this later? Maybe after I eat breakfast?" Offered OOB to chair in prep for breakfast, but declined.)  Will re-attempt at later time/date as appropriate and available.  [name], PT, DPT, NCS 05/20/22, 8:53 AM [phone]

## 2022-05-20 NOTE — Progress Notes (Signed)
PT Cancellation Note  Patient Details Name: Samuel Molina MRN: [id number] DOB: 1961/04/19   Cancelled Treatment:    Reason Eval/Treat Not Completed:  (Evaluation re-attempted after breakfast per patient request.  Upon arrival to room, patient immediately stating "No.  Everything is 'no' today. You said you weren't going to be here today"  Unable to reason with and redirect.  Will re-attempt next date.)   Everardo Voris H. Owens Shark, PT, DPT, NCS 05/20/22, 9:56 AM [phone]

## 2022-05-20 NOTE — Discharge Instructions (Signed)
[facility] in [place], [place]  Address: [address], [place], [place] [address] Hours:  Closed ? Opens 8?AM Mon Phone: [phone] Sherrian Divers:  Substance abuse specialist cell number is [phone]  [place] [facility] in [place], [facility] Directions Call  Address: [address], [place], [place] [address] Hours:  Closed ? Opens 8:30?AM Mon Phone: [phone]  Pt advised to f/u with PCP as out pt Keep log of BP at home Take your meds as directed Abstain from Cocaine

## 2022-05-20 NOTE — Progress Notes (Signed)
Pt insists to take IV out since it has not been using, educated him about purpose of PIV during hospitalization. PIV out. MD aware

## 2022-05-20 NOTE — Progress Notes (Signed)
Pt is refusing tele monitoring at this time. Pt educated on tele monitoring system. MD aware.

## 2022-05-21 DIAGNOSIS — E785 Hyperlipidemia, unspecified: Secondary | ICD-10-CM | POA: Diagnosis not present

## 2022-05-21 DIAGNOSIS — F1414 Cocaine abuse with cocaine-induced mood disorder: Secondary | ICD-10-CM | POA: Diagnosis not present

## 2022-05-21 DIAGNOSIS — R531 Weakness: Secondary | ICD-10-CM | POA: Diagnosis not present

## 2022-05-21 DIAGNOSIS — I10 Essential (primary) hypertension: Secondary | ICD-10-CM | POA: Diagnosis not present

## 2022-05-21 MED ORDER — DOXYCYCLINE HYCLATE 100 MG PO TABS: 100.0000 mg | ORAL_TABLET | Freq: Two times a day (BID) | ORAL | 0 refills | Status: AC

## 2022-05-21 NOTE — Progress Notes (Cosign Needed)
Occupational Therapy * Physical Therapy * Speech Therapy  DATE 05/21/22 PATIENT NAME Samuel Molina PATIENT MRN [id number]  DIAGNOSIS/DIAGNOSIS CODE R53.1 DATE OF DISCHARGE 05/21/22  PRIMARY CARE PHYSICIAN None PCP PHONE/FAX   Dear Provider (Name: [facility] Fax: [phone]):   I certify that I have examined this patient and that occupational/physical/speech therapy is necessary on an outpatient basis.    The patient has expressed interest in completing their recommended course of therapy at your location.  Once a formal order from the patient's primary care physician has been obtained, please contact him/her to schedule an appointment for evaluation at your earliest convenience.  [  X ]  Physical Therapy Evaluate and Treat  The patient's primary care physician (listed above) must furnish and be responsible for a formal order such that the recommended services may be furnished while under the primary physician's care, and that the plan of care will be established and reviewed every 30 days (or more often if condition necessitates).

## 2022-05-21 NOTE — Discharge Summary (Addendum)
Physician Discharge Summary   Patient: Samuel Molina MRN: [id number] DOB: 09-30-1960  Admit date:     05/19/2022  Discharge date: 05/21/22  Discharge Physician: Fritzi Mandes   PCP: System, Provider Not In   Recommendations at discharge:    Pt advised to f/u with PCP as out pt Keep log of BP at home Take your meds as directed Abstain from Cocaine  Discharge Diagnoses: Active Problems:   Left-sided weakness   Cocaine abuse with cocaine-induced mood disorder (HCC)   HLD (hyperlipidemia)   HTN (hypertension)   Personality disorder (HCC)   Depression with anxiety   Cocaine abuse (HCC)   Tobacco abuse   Spider bite_to right lower lateral leg   Obesity (BMI 30-39.9)   Hospital Course:  Samuel Molina is a 62 y.o. male with medical history significant of stroke, TIA, hypertension, hyperlipidemia, depression with anxiety, personality disorder, tobacco abuse, cocaine abuse, rectal bleeding, left side Bell's palsy, who presents with left sided weakness, slurred speech, facial droop.    Left-sided weakness: chronic.  -- Patient has had extensive workup here and at [facility] with multiple imaging studies revealing no stroke. Patient is fixated he has hemorrhage in the brain. He even told me he had tPA in 2022 at [facility] which was incorrect after I reviewed [facility] records. -- CT of the head and MRI for brain is negative for acute intracranial issues.  -- Consulted Dr. Leonel Ramsay of neurology. He suspects nonorganic etiology, such as possible  conversion disorder. pt does not want to follow up with Neurology. Low yield for EEG per Neuro. Can be done as outpt -- Ambulates using walker in the room.PT/OT--recommends out pt  --pt does not have CVA on imaging studies. Per [facility] notes pt did not receive TPA in 06/2020. --pt received TPA in 2021 at [facility]-- CT head at that time did not show CVA    HLD (hyperlipidemia) -Lipitor   Hypertension: -IV hydralazine as needed -Amlodipine, lisinopril    Personality disorder, depression with anxiety: Denies suicidal homicidal ideations. -continue home meds  -- seen by psychiatry. --Has underlying issue with malingering/conversion disorder.(Old records)   Cocaine abuse, and Tobacco abuse -Did counseling about importance of quitting substance use   Spider bite_to right lower lateral leg:  pt has a small painful indurated area to the right lateral lower leg, no drainage. Pt has mild leukocytosis with WBC 12.2.  No fever. -Start doxycycline empirically for 5 days   Obesity (BMI 30-39.9): Body weight by 15.2 kg, BMI 33.51 -Exercise and healthy diet -Encouraged losing weight    overall stable.will discharge to home. D/c pt   DVT ppx:   SQ Lovenox   Code Status: Full code  Family communication : none Consults : neurology CODE STATUS full code DVT Prophylaxis : Lovenox     Diet recommendation:  Discharge Diet Orders (From admission, onward)     Start     Ordered   05/21/22 0000  Diet - low sodium heart healthy        05/21/22 1041           Cardiac diet DISCHARGE MEDICATION: Allergies as of 05/21/2022       Reactions   Acetaminophen Itching, Nausea And Vomiting, Rash        Medication List     STOP taking these medications    brompheniramine-pseudoephedrine-DM 30-2-10 MG/5ML syrup   fluticasone 50 MCG/ACT nasal spray Commonly known as: FLONASE   ketorolac 10 MG tablet Commonly known as: TORADOL  TAKE these medications    amLODipine 10 MG tablet Commonly known as: NORVASC Take 1 tablet (10 mg total) by mouth daily.   Aspirin Low Dose 81 MG tablet Generic drug: aspirin EC Take 81 mg by mouth every morning.   atorvastatin 80 MG tablet Commonly known as: LIPITOR Take 1 tablet (80 mg total) by mouth every evening.   benzonatate 100 MG capsule Commonly known as: Tessalon Perles Take 1 capsule (100 mg total) by mouth 3 (three) times daily as needed for cough.   citalopram 10 MG  tablet Commonly known as: CELEXA Take 1 tablet (10 mg total) by mouth daily.   doxycycline 100 MG tablet Commonly known as: VIBRA-TABS Take 1 tablet (100 mg total) by mouth every 12 (twelve) hours for 3 days.   hydrOXYzine 50 MG capsule Commonly known as: VISTARIL Take 100 mg by mouth every 8 (eight) hours as needed for anxiety.   ibuprofen 800 MG tablet Commonly known as: ADVIL Take 800 mg by mouth every 8 (eight) hours as needed for pain.   lisinopril 40 MG tablet Commonly known as: ZESTRIL Take 1 tablet (40 mg total) by mouth daily.   methocarbamol 500 MG tablet Commonly known as: ROBAXIN Take 500 mg by mouth every 8 (eight) hours as needed for muscle spasms.   mirtazapine 15 MG tablet Commonly known as: REMERON Take 15 mg by mouth at bedtime.   pantoprazole 40 MG tablet Commonly known as: PROTONIX Take 1 tablet (40 mg total) by mouth 2 (two) times daily.   QUEtiapine 100 MG tablet Commonly known as: SEROQUEL Take 1 tablet (100 mg total) by mouth at bedtime.   traMADol 50 MG tablet Commonly known as: ULTRAM Take 50 mg by mouth every 6 (six) hours as needed for pain.               Durable Medical Equipment  (From admission, onward)           Start     Ordered   05/21/22 1042  DME Walker  Once       Question Answer Comment  Walker: With 5 Inch Wheels   Patient needs a walker to treat with the following condition Weakness      05/21/22 1041            Discharge Exam: Filed Weights   05/19/22 0532  Weight: 115.2 kg   Chronic left sided weakness with chronic left bells palsy  Condition at discharge: fair  The results of significant diagnostics from this hospitalization (including imaging, microbiology, ancillary and laboratory) are listed below for reference.   Imaging Studies: MR BRAIN WO CONTRAST  Result Date: 05/19/2022 CLINICAL DATA:  62 year old male code stroke presentation with left side weakness, slurred speech, facial droop.  EXAM: MRI HEAD WITHOUT CONTRAST TECHNIQUE: Multiplanar, multiecho pulse sequences of the brain and surrounding structures were obtained without intravenous contrast. COMPARISON:  Plain head CT 0518 hours today.  Brain MRI 11/08/2021. FINDINGS: Brain: Cerebral volume remains normal for age. No restricted diffusion to suggest acute infarction. No midline shift, mass effect, evidence of mass lesion, ventriculomegaly, extra-axial collection or acute intracranial hemorrhage. Cervicomedullary junction and pituitary are within normal limits. Pearline Cables and white matter signal remains within normal limits throughout the brain. No convincing encephalomalacia. No chronic cerebral blood products on SWI. 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  Deep gray matter nuclei, brainstem, and cerebellum appear negative. Vascular: Major intracranial vascular flow voids are stable. Skull and upper cervical spine: Negative, visualized bone marrow signal is within normal limits. Sinuses/Orbits: Negative orbits. Mild paranasal sinus mucosal thickening appears stable or improved from last year. Other: Mastoids remain well aerated. Grossly normal visible internal auditory structures. Negative stylomastoid foramina, visible scalp and face. IMPRESSION: Continued normal for age noncontrast MRI appearance of the brain. Electronically Signed   By: Genevie Ann M.D.   On: 05/19/2022 07:04   CT HEAD CODE STROKE WO CONTRAST  Addendum Date: 05/19/2022   ADDENDUM REPORT: 05/19/2022 05:38 ADDENDUM: Study discussed by telephone with Dr. Luvenia Starch SUNG on 05/19/2022 at 0531 hours. Electronically Signed   By: Genevie Ann M.D.   On: 05/19/2022 05:38   Result Date: 05/19/2022 CLINICAL DATA:  Code stroke. 62 year old male with left side weakness, slurred speech, facial droop. EXAM: CT HEAD WITHOUT CONTRAST TECHNIQUE:  Contiguous axial images were obtained from the base of the skull through the vertex without intravenous contrast. RADIATION DOSE REDUCTION: This exam was performed according to the departmental dose-optimization program which includes automated exposure control, adjustment of the mA and/or kV according to patient size and/or use of iterative reconstruction technique. COMPARISON:  Brain MRI and head CT 11/08/2021. FINDINGS: Brain: Cerebral volume remains normal for age. No midline shift, ventriculomegaly, mass effect, evidence of mass lesion, intracranial hemorrhage or evidence of cortically based acute infarction. Gray-white matter differentiation is within normal limits throughout the brain. Vascular: Stable. Unchanged asymmetric density of a right MCA branch in the sylvian fissure on series 3, image 12, was negative last year. No suspicious intracranial vascular hyperdensity. Skull: Chronic appearing deformity of the left maxillary sinus. No acute osseous abnormality identified. Sinuses/Orbits: Visualized paranasal sinuses and mastoids are clear. Other: Orbit and scalp soft tissues remain negative. ASPECTS [facility] Stroke Program Early CT Score) Total score (0-10 with 10 being normal): 10 IMPRESSION: Stable and normal for age noncontrast CT appearance of the brain. ASPECTS 10. Electronically Signed: By: Genevie Ann M.D. On: 05/19/2022 05:28    Microbiology: Results for orders placed or performed during the hospital encounter of 05/19/22  Culture, blood (Routine X 2) w Reflex to ID Panel     Status: None (Preliminary result)   Collection Time: 05/19/22 10:00 AM   Specimen: BLOOD  Result Value Ref Range Status   Specimen Description BLOOD BLOOD LEFT ARM  Final   Special Requests   Final    BOTTLES DRAWN AEROBIC AND ANAEROBIC Blood Culture adequate volume   Culture   Final    NO GROWTH 2 DAYS Performed at [facility], [address], [place], [place] [address]    Report Status PENDING   Incomplete  Culture, blood (Routine X 2) w Reflex to ID Panel     Status: None (Preliminary result)   Collection Time: 05/19/22 10:00 AM   Specimen: BLOOD  Result Value Ref Range Status   Specimen Description BLOOD BLOOD LEFT HAND  Final   Special Requests   Final    BOTTLES DRAWN AEROBIC AND ANAEROBIC Blood Culture adequate volume   Culture   Final    NO GROWTH 2 DAYS Performed at [facility], [address] [place], [place] [address]    Report Status PENDING  Incomplete    Labs: CBC: Recent Labs  Lab 05/19/22 0523  WBC 12.2*  NEUTROABS 8.2*  HGB 14.1  HCT 43.9  MCV 79.8*  PLT 306   Basic  Metabolic Panel: Recent Labs  Lab 05/19/22 0523  NA 142  K 3.8  CL 108  CO2 23  GLUCOSE 100*  BUN 23*  CREATININE 0.97  CALCIUM 9.4   Liver Function Tests: Recent Labs  Lab 05/19/22 0523  AST 26  ALT 19  ALKPHOS 75  BILITOT 0.9  PROT 8.0  ALBUMIN 4.2   CBG: Recent Labs  Lab 05/19/22 0525  GLUCAP 103*    Discharge time spent: greater than 30 minutes.  Signed: Fritzi Mandes, MD [facility] 05/21/2022

## 2022-05-21 NOTE — Progress Notes (Addendum)
Patient discharged home. Belongings sent home with patient and cane delivered to patients room. Discharge instructions provided by Velna Hatchet, RN. Upon entering patient room after discharge, care fusion hair trimmer that patient was using is unable to be located.

## 2022-05-21 NOTE — Evaluation (Signed)
Physical Therapy Evaluation Patient Details Name: Samuel Molina MRN: [id number] DOB: 1960-11-27 Today's Date: 05/21/2022  History of Present Illness  Samuel Molina is a 89yoM who comes to [facility] on 1/26 c Left hemi weakness. History of cocaine use, recent loss of a friend, multiple presentations with left-sided weakness with negative workup. Per neurology 1/26 "He is fixated on the fact that he had "hemorrhage into my brain" in the past.  He is unwilling to discuss the possibility that he did not previously have hemorrhage or ischemic stroke." UDS is positive for cocaine.   Clinical Impression  Patient received lying down in bed. He is agreeable to PT. States he was just sitting up eating breakfast. Has L UE and LE weakness, decreased ROM, that is waxing/waning/inconsistent. Patient is able to stand with supervision. Ambulated around bed with supervision. RUE on bed for stabilization although he can walk some without single UE support. Patient will continue to benefit from skilled PT to improve mobility, strength and independence.         Recommendations for follow up therapy are one component of a multi-disciplinary discharge planning process, led by the attending physician.  Recommendations may be updated based on patient status, additional functional criteria and insurance authorization.  Follow Up Recommendations Outpatient PT      Assistance Recommended at Discharge Intermittent Supervision/Assistance  Patient can return home with the following  Assist for transportation;Help with stairs or ramp for entrance    Equipment Recommendations Cane  Recommendations for Other Services       Functional Status Assessment Patient has had a recent decline in their functional status and demonstrates the ability to make significant improvements in function in a reasonable and predictable amount of time.     Precautions / Restrictions Precautions Precautions: Fall Restrictions Weight  Bearing Restrictions: No      Mobility  Bed Mobility Overal bed mobility: Independent                  Transfers Overall transfer level: Modified independent Equipment used: None Transfers: Sit to/from Stand                  Ambulation/Gait Ambulation/Gait assistance: Supervision Gait Distance (Feet): 10 Feet Assistive device: None (patient holding onto bed with R UE for steadying.) Gait Pattern/deviations: Step-to pattern, Decreased step length - right, Decreased step length - left, Decreased stride length, Shuffle Gait velocity: decr     General Gait Details: shuffling L LE, keeps L UE flexed and has decreased use.  Stairs            Wheelchair Mobility    Modified Rankin (Stroke Patients Only)       Balance Overall balance assessment: Modified Independent Sitting-balance support: Feet supported Sitting balance-Leahy Scale: Normal     Standing balance support: Single extremity supported, During functional activity, No upper extremity supported Standing balance-Leahy Scale: Fair                               Pertinent Vitals/Pain Pain Assessment Pain Assessment: Faces Faces Pain Scale: Hurts a little bit Pain Location: L shoulder with flexion Pain Descriptors / Indicators: Discomfort Pain Intervention(s): Monitored during session    Home Living Family/patient expects to be discharged to:: Private residence Living Arrangements: Alone Available Help at Discharge: Available PRN/intermittently;Other (Comment) (states his brother is coming down to stay with him for a little while) Type of Home: House Home Access: Stairs to  enter Entrance Stairs-Rails: Right;Left;Can reach both Entrance Stairs-Number of Steps: 5 Alternate Level Stairs-Number of Steps: 9 Home Layout: Two level;Able to live on main level with bedroom/bathroom Home Equipment: None      Prior Function Prior Level of Function : Independent/Modified  Independent;Driving;Working/employed             Mobility Comments: no AD use, runs hot dog cart for work, drives ADLs Comments: independent at baseline     Hand Dominance   Dominant Hand: Right    Extremity/Trunk Assessment   Upper Extremity Assessment Upper Extremity Assessment: LUE deficits/detail LUE Deficits / Details: inconsistent weakness in LUE . 3/5 grip. 5 digit opposition intact LUE Sensation: decreased light touch    Lower Extremity Assessment Lower Extremity Assessment: LLE deficits/detail LLE Deficits / Details: inconsistent weakness/ROM LLE Sensation: decreased light touch LLE Coordination: decreased gross motor    Cervical / Trunk Assessment Cervical / Trunk Assessment: Normal  Communication   Communication: No difficulties  Cognition Arousal/Alertness: Awake/alert Behavior During Therapy: WFL for tasks assessed/performed Overall Cognitive Status: Within Functional Limits for tasks assessed                                          General Comments      Exercises     Assessment/Plan    PT Assessment Patient needs continued PT services  PT Problem List Decreased strength;Decreased range of motion;Decreased activity tolerance;Decreased balance;Decreased mobility;Impaired sensation       PT Treatment Interventions DME instruction;Gait training;Therapeutic exercise;Stair training;Functional mobility training;Therapeutic activities;Patient/family education    PT Goals (Current goals can be found in the Care Plan section)  Acute Rehab PT Goals Patient Stated Goal: to return home PT Goal Formulation: With patient Time For Goal Achievement: 05/24/22 Potential to Achieve Goals: Good    Frequency Min 2X/week     Co-evaluation               AM-PAC PT "6 Clicks" Mobility  Outcome Measure Help needed turning from your back to your side while in a flat bed without using bedrails?: None Help needed moving from lying on your  back to sitting on the side of a flat bed without using bedrails?: None Help needed moving to and from a bed to a chair (including a wheelchair)?: A Little Help needed standing up from a chair using your arms (e.g., wheelchair or bedside chair)?: A Little Help needed to walk in hospital room?: A Little Help needed climbing 3-5 steps with a railing? : A Little 6 Click Score: 20    End of Session   Activity Tolerance: Patient limited by fatigue Patient left: in bed;with call bell/phone within reach Nurse Communication: Mobility status PT Visit Diagnosis: Other abnormalities of gait and mobility (R26.89);Muscle weakness (generalized) (M62.81);Difficulty in walking, not elsewhere classified (R26.2)    Time: 9604-5409 PT Time Calculation (min) (ACUTE ONLY): 19 min   Charges:   PT Evaluation $PT Eval Moderate Complexity: 1 Mod PT Treatments $Gait Training: 8-22 mins        Kierre Deines, PT, GCS 05/21/22,10:41 AM

## 2022-05-21 NOTE — TOC Initial Note (Addendum)
Transition of Care [facility]) - Initial/Assessment Note    Patient Details  Name: Samuel Molina MRN: [id number] Date of Birth: [birth year]  Transition of Care [facility]) CM/SW Contact:    Magnus Ivan, LCSW Phone Number: 05/21/2022, 11:20 AM  Clinical Narrative:      CSW spoke with patient regarding DC planning and PT recs. Patient lives alone and drives himself to appointments.  Patient states he no longer has a PCP, declined PCP resources, and agreed to reach out to his insurance company to get established with an in network PCP. Pharmacy is [place].  Patient is agreeable to OPPT and cane recommendations. He prefers [employer] for [employer] - referral sent in [place]. Cane ordered through [employer] to be delivered to bedside prior to DC today. Patient requests cab voucher, confirmed address, voucher provided.   2:56- Called Jasmine with [employer] to check status on cane delivery. Delana Meyer states she will reach out to the Education officer, community.    Expected Discharge Plan: OP Rehab Barriers to Discharge: Barriers Resolved   Patient Goals and CMS Choice Patient states their goals for this hospitalization and ongoing recovery are:: to return home CMS Medicare.gov Compare Post Acute Care list provided to:: Patient Choice offered to / list presented to : Patient      Expected Discharge Plan and Services       Living arrangements for the past 2 months: Single Family Home Expected Discharge Date: 05/21/22               DME Arranged: Kasandra Knudsen DME Agency: [employer] Date DME Agency Contacted: 05/21/22   Representative spoke with at DME Agency: Delana Meyer            Prior Living Arrangements/Services Living arrangements for the past 2 months: [place] Lives with:: Self Patient language and need for interpreter reviewed:: Yes Do you feel safe going back to the place where you live?: Yes      Need for Family Participation in Patient Care: Yes (Comment) Care giver support  system in place?: Yes (comment)   Criminal Activity/Legal Involvement Pertinent to Current Situation/Hospitalization: No - Comment as needed  Activities of Daily Living Home Assistive Devices/Equipment: None ADL Screening (condition at time of admission) Patient's cognitive ability adequate to safely complete daily activities?: Yes Is the patient deaf or have difficulty hearing?: No Does the patient have difficulty seeing, even when wearing glasses/contacts?: No Does the patient have difficulty concentrating, remembering, or making decisions?: No Patient able to express need for assistance with ADLs?: No Does the patient have difficulty dressing or bathing?: No Independently performs ADLs?: Yes (appropriate for developmental age) Does the patient have difficulty walking or climbing stairs?: No Weakness of Legs: None Weakness of Arms/Hands: None  Permission Sought/Granted Permission sought to share information with : Chartered certified accountant granted to share information with : Yes, Verbal Permission Granted     Permission granted to share info w AGENCY: DME, [place]        Emotional Assessment       Orientation: : Oriented to Self, Oriented to Situation, Oriented to  Time, Oriented to Place   Psych Involvement: No (comment)  Admission diagnosis:  TIA (transient ischemic attack) [G45.9] CVA (cerebral vascular accident) [facility]) [I63.9] Cerebrovascular accident (CVA), unspecified mechanism ([place]) [I63.9] Patient Active Problem List   Diagnosis Date Noted   Leukocytosis 05/19/2022   Spider bite_to right lower lateral leg 05/19/2022   HTN (hypertension) 05/19/2022   Cocaine  abuse with cocaine-induced mood disorder ([place]) 01/14/2022   Obesity (BMI 30-39.9) 11/23/2021   Dyspepsia    Rectal bleeding    MRSA bacteremia 11/10/2021   Streptococcal bacteremia 11/10/2021   Bacteremia 11/09/2021   Depression with anxiety 11/08/2021   Hypertensive  urgency 11/08/2021   Suicidal ideation 11/08/2021   Cellulitis and abscess of left leg 11/08/2021   Aggressive behavior    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   Ear drainage 11/07/2020   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    Cocaine use disorder, severe, dependence ([place])    Depression    HLD (hyperlipidemia)    Tobacco abuse    Chest pain    Left-sided weakness    COVID-19 virus infection    Bell's palsy 04/26/2020   Personality disorder ([place]) 02/13/2020   Bursitis 02/13/2020   Substance induced mood disorder ([place]) 02/12/2020   Severe recurrent major depression without psychotic features ([place]) 01/21/2020   Cocaine abuse ([place]) 01/21/2020   Hip pain 01/21/2020   Major depression 01/20/2020   Gastroesophageal reflux disease with esophagitis    Morbid obesity ([place]) 09/19/2017   Arthritis of knee 11/03/2015   PCP:  System, Provider Not In Pharmacy:   [id number] [id number]: [phone]: [phone]    Social Determinants of Health (SDOH) Social History: SDOH Screenings   Food Insecurity: Unknown (05/19/2022)  Transportation Needs: Unknown (05/19/2022)  Utilities: Unknown (05/19/2022)  Alcohol Screen: Low Risk  (11/03/2020)  Depression (PHQ2-9): Low Risk  (12/06/2021)  Tobacco Use: High Risk (05/20/2022)   SDOH Interventions:     Readmission Risk Interventions    06/08/2020    1:33 PM  Readmission Risk Prevention Plan  Transportation Screening Complete  Medication Review (RN Care Manager) Complete  PCP or Specialist appointment within 3-5 days of discharge Not Complete  PCP/Specialist Appt Not Complete [facility] [place]  HRI or Home Care Consult Pt Refusal Comments NA  SW Recovery Care/Counseling Consult Complete  Palliative Care Screening Not [place]

## 2022-05-24 LAB — CULTURE, BLOOD (ROUTINE X 2)
Culture: NO GROWTH
Culture: NO GROWTH
Special Requests: ADEQUATE
Special Requests: ADEQUATE

## 2022-05-29 DIAGNOSIS — R69 Illness, unspecified: Secondary | ICD-10-CM | POA: Diagnosis not present

## 2022-05-29 DIAGNOSIS — F1020 Alcohol dependence, uncomplicated: Secondary | ICD-10-CM | POA: Diagnosis not present

## 2022-05-30 DIAGNOSIS — F1020 Alcohol dependence, uncomplicated: Secondary | ICD-10-CM | POA: Diagnosis not present

## 2022-05-30 DIAGNOSIS — R69 Illness, unspecified: Secondary | ICD-10-CM | POA: Diagnosis not present

## 2022-05-31 DIAGNOSIS — R69 Illness, unspecified: Secondary | ICD-10-CM | POA: Diagnosis not present

## 2022-05-31 DIAGNOSIS — F1020 Alcohol dependence, uncomplicated: Secondary | ICD-10-CM | POA: Diagnosis not present

## 2022-06-01 DIAGNOSIS — F1020 Alcohol dependence, uncomplicated: Secondary | ICD-10-CM | POA: Diagnosis not present

## 2022-06-01 DIAGNOSIS — R69 Illness, unspecified: Secondary | ICD-10-CM | POA: Diagnosis not present

## 2022-06-02 DIAGNOSIS — R69 Illness, unspecified: Secondary | ICD-10-CM | POA: Diagnosis not present

## 2022-06-02 DIAGNOSIS — F1020 Alcohol dependence, uncomplicated: Secondary | ICD-10-CM | POA: Diagnosis not present

## 2022-06-02 IMAGING — MR MR HEAD W/O CM
11 series · 41 of 48 positions shown · non-contrast
Comparison: Prior CT from earlier the same day.

CLINICAL DATA: Initial evaluation for acute left-sided facial droop
with left-sided weakness and numbness, possible stroke.

EXAM:
MRI HEAD WITHOUT CONTRAST
TECHNIQUE: Multiplanar, multiecho pulse sequences of the brain and surrounding
structures were obtained without intravenous contrast.

[Series 5: ax dwi_tracew · axial · 3.0mm · 0.60mm/px · z∈[-117,+38]mm · 4 of 48 slices shown]
[im 1/48]
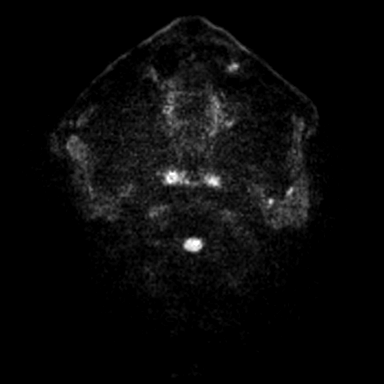
[im 16/48]
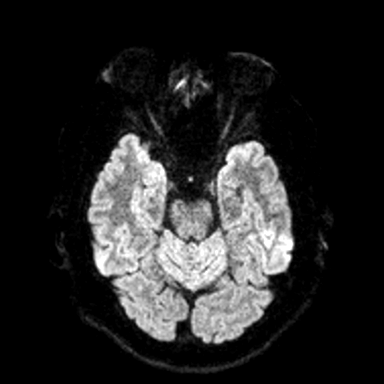
[im 32/48]
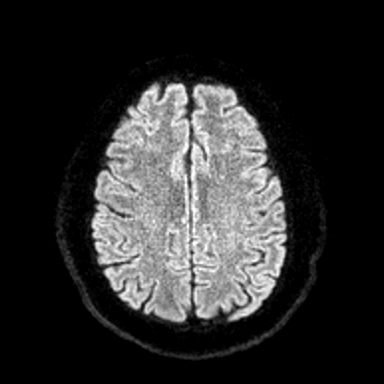
[im 48/48]
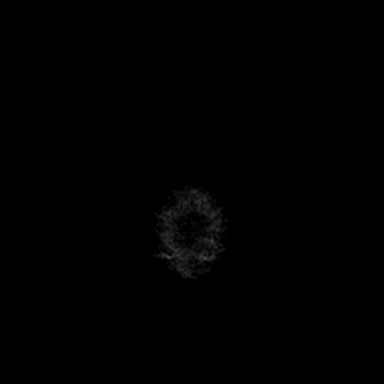

[Series 6: ax dwi_adc · axial · 3.0mm · 0.60mm/px · z∈[-117,+38]mm · 4 of 48 slices shown]
[im 1/48]
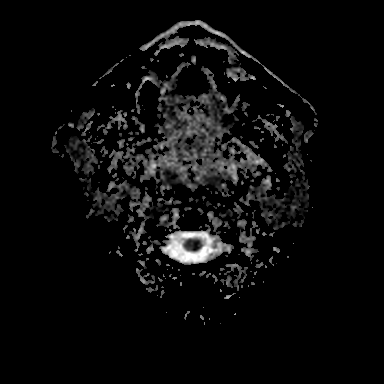
[im 16/48]
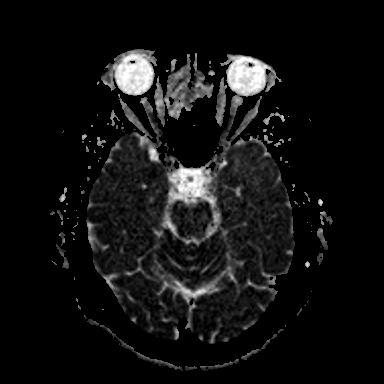
[im 32/48]
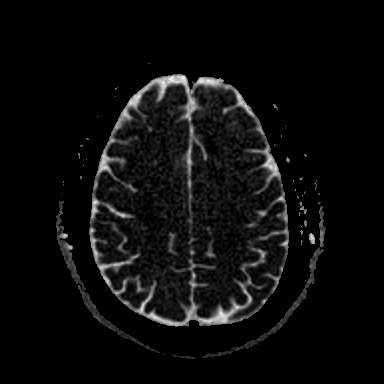
[im 48/48]
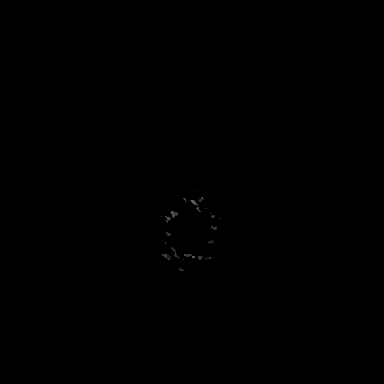

[Series 7: cor dwi_tracew · coronal · 5.0mm · 0.60mm/px · 3 of 40 slices shown]
[im 1/40]
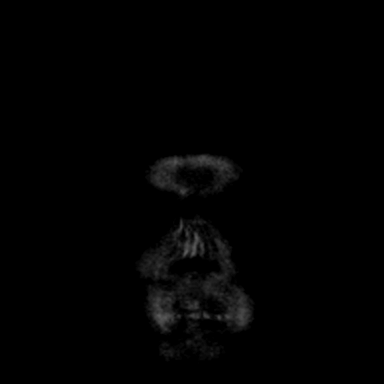
[im 20/40]
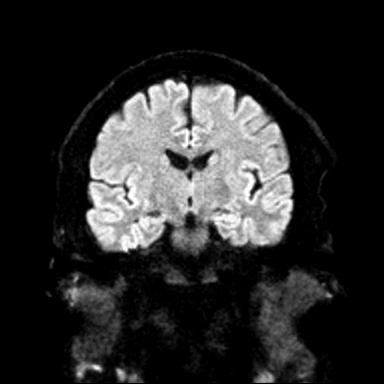
[im 40/40]
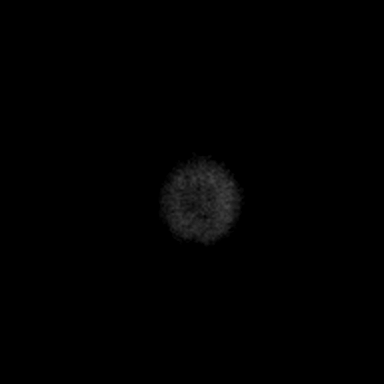

[Series 8: cor dwi_adc · coronal · 5.0mm · 0.60mm/px · 3 of 40 slices shown]
[im 1/40]
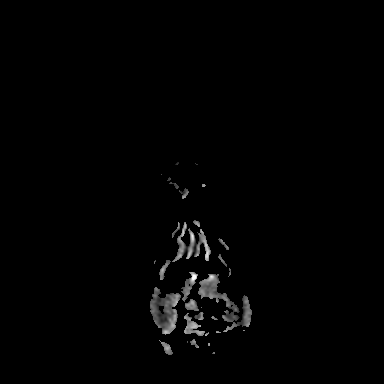
[im 20/40]
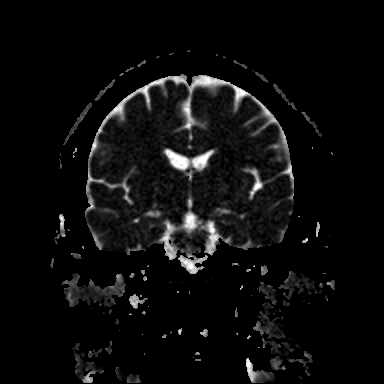
[im 40/40]
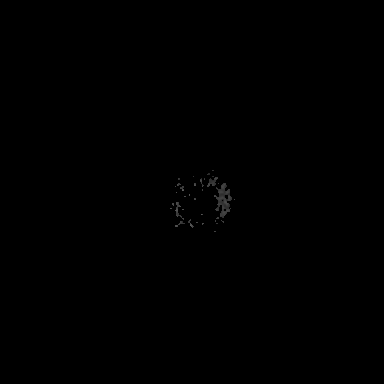

[Series 9: T1 · sagittal · 5.0mm · 0.62mm/px · 2 of 25 slices shown (1 of 2)]
[im 1/25]
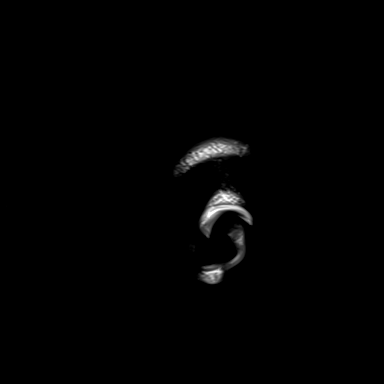
[im 25/25]
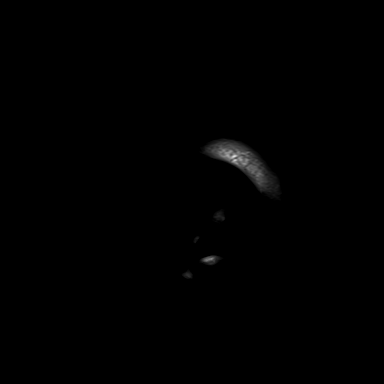

[Series 10: T2 · axial · 5.0mm · 0.53mm/px · z∈[-117,+39]mm · 2 of 27 slices shown (1 of 2)]
[im 1/27]
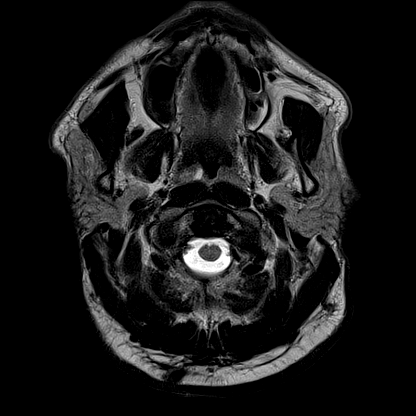
[im 27/27]
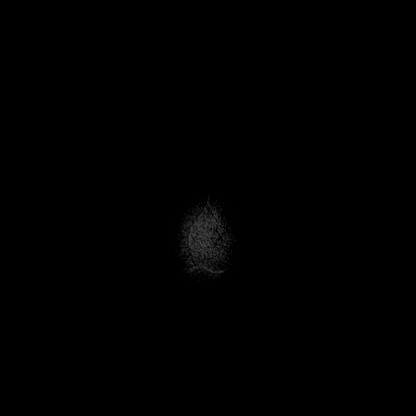

[Series 12: pha_images · axial · 3.0mm · 0.90mm/px · z∈[-124,+50]mm · 5 of 59 slices shown]
[im 1/59]
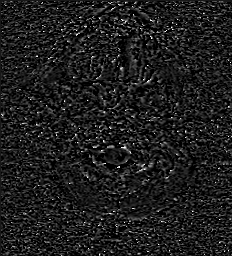
[im 15/59]
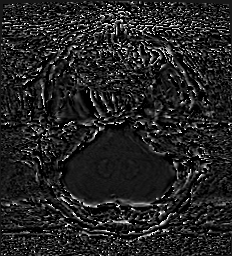
[im 30/59]
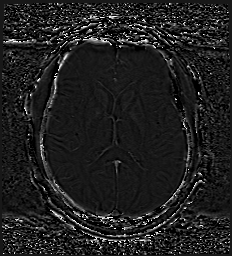
[im 44/59]
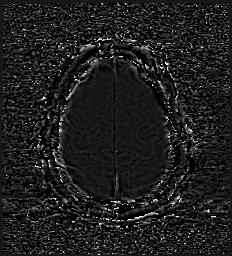
[im 59/59]
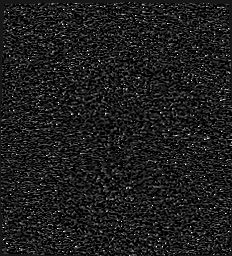

[Series 13: swi_images · axial · 3.0mm · 0.90mm/px · z∈[-127,+5]mm · 4 of 60 slices shown]
[im 1/60]
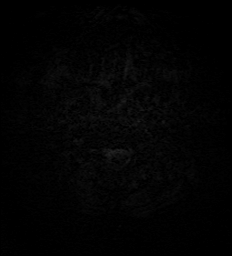
[im 15/60]
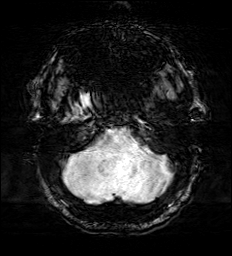
[im 30/60]
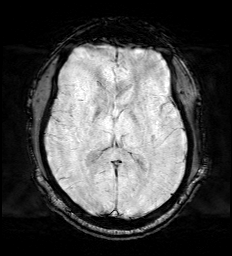
[im 45/60]
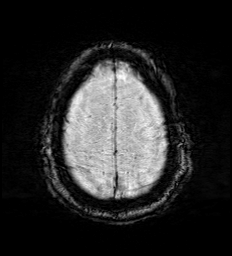

[Series 15: FLAIR · axial · 3.0mm · 0.53mm/px · z∈[-120,+42]mm · 4 of 55 slices shown]
[im 1/55]
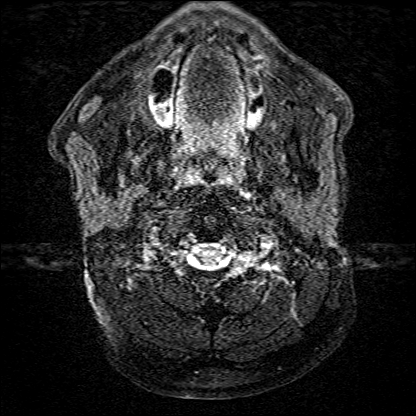
[im 19/55]
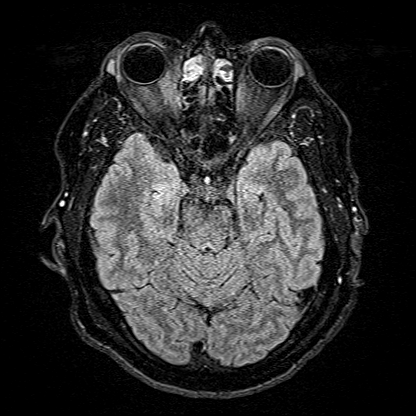
[im 37/55]
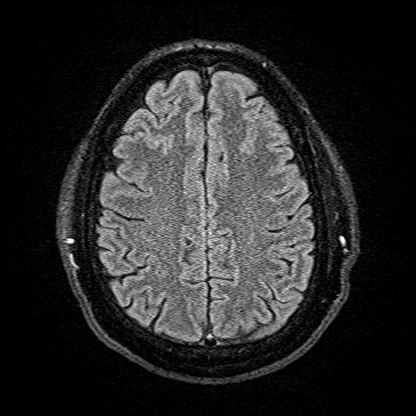
[im 55/55]
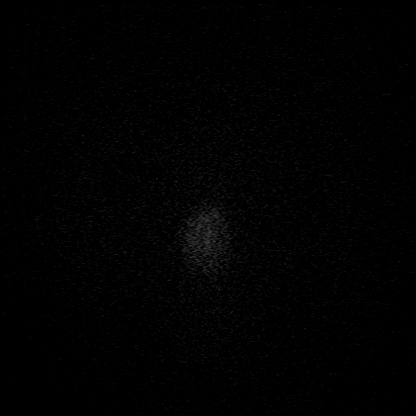

[Series 16: T1 · axial · 1.0mm · 0.98mm/px · z∈[-127,+48]mm · 8 of 176 slices shown (2 of 2)]
[im 1/176]
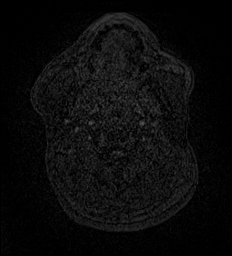
[im 27/176]
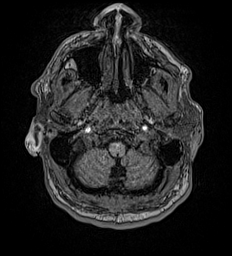
[im 54/176]
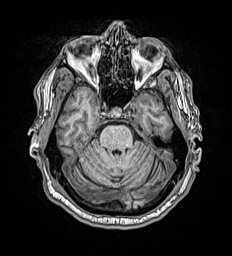
[im 81/176]
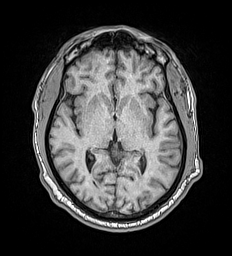
[im 95/176]
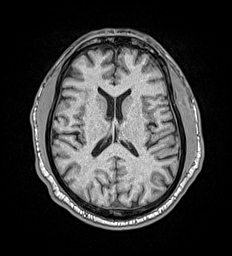
[im 122/176]
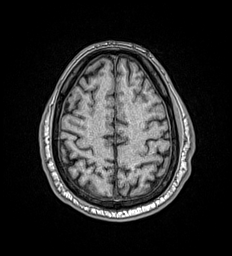
[im 149/176]
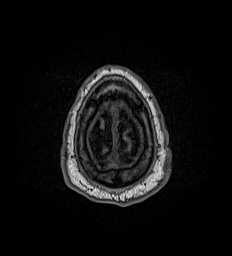
[im 176/176]
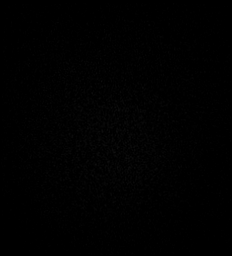

[Series 17: T2 · coronal · 5.0mm · 0.57mm/px · 2 of 31 slices shown (2 of 2)]
[im 1/31]
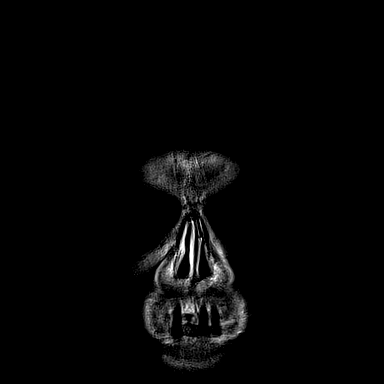
[im 31/31]
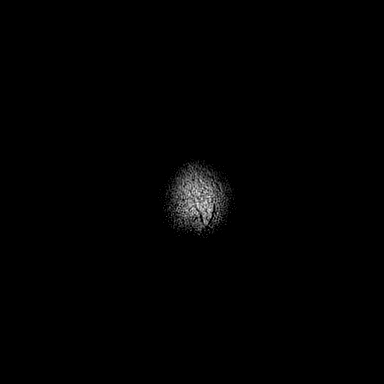

[41 of 48 positions shown; findings below may reference images not displayed]

FINDINGS: Brain: Cerebral volume within normal limits for patient age. No
focal parenchymal signal abnormality identified.

No abnormal foci of restricted diffusion to suggest acute or
subacute ischemia. Gray-white matter differentiation well
maintained. No encephalomalacia to suggest chronic infarction. No
foci of susceptibility artifact to suggest acute or chronic
intracranial hemorrhage.

5 mm T1 hyperintense lesion at the left quadrigeminal plate cistern
noted. This demonstrates fat density on prior CT, and is most
consistent with a small benign lipoma. Finding felt to be of
doubtful significance. No other mass lesion, midline shift or mass
effect. No hydrocephalus. No extra-axial fluid collection. Major
dural sinuses are grossly patent.

Pituitary gland and suprasellar region are normal. Midline
structures intact and normal.

Vascular: Major intracranial vascular flow voids well maintained and
normal in appearance.

Skull and upper cervical spine: Craniocervical junction normal.
Visualized upper cervical spine within normal limits. Bone marrow
signal intensity normal. No scalp soft tissue abnormality.

Sinuses/Orbits: Globes and orbital soft tissues within normal
limits.

Scattered mucosal thickening noted within the sphenoethmoidal
sinuses. Paranasal sinuses are otherwise largely clear. Trace fluid
signal intensity noted within the right mastoid air cells, of
doubtful significance. Inner ear structures grossly normal.

Other: None.
IMPRESSION: Unremarkable brain MRI for age. No acute intracranial infarct or
other abnormality.

## 2022-06-02 IMAGING — CT CT HEAD W/O CM
3 series · 15 of 47 positions shown, 18 images · non-contrast
Comparison: None.

CLINICAL DATA: Left-sided weakness and facial droop. Suspected
stroke.

EXAM:
CT HEAD WITHOUT CONTRAST
TECHNIQUE: Contiguous axial images were obtained from the base of the skull
through the vertex without intravenous contrast.

[Series 2: head wo · axial · 0.43mm/px · z∈[-120,+5]mm · 9 of 31 slices shown, 12 images]
[im 3/31  brain]
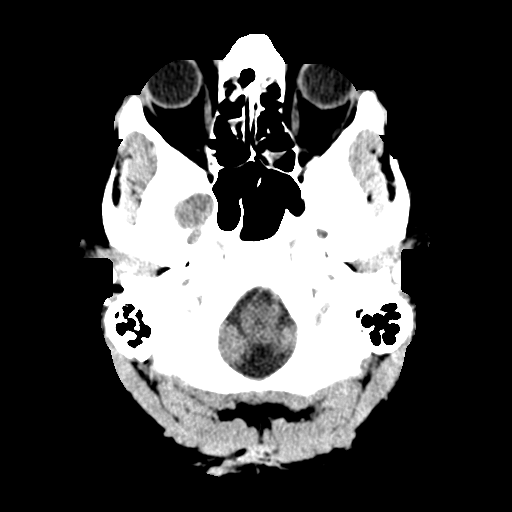
[im 3/31  bone]
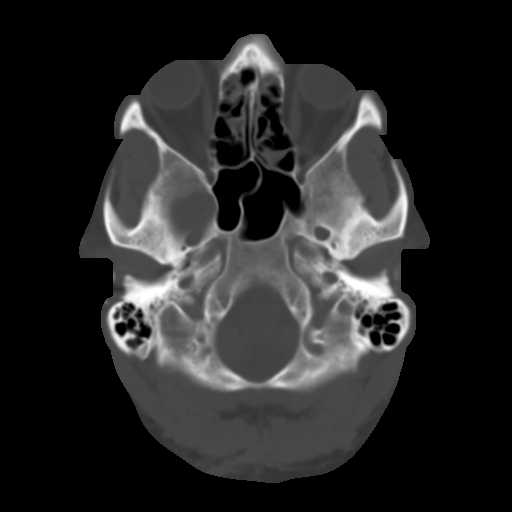
[im 6/31  brain]
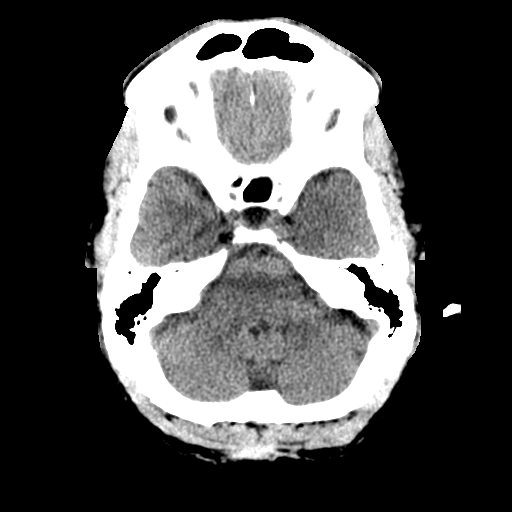
[im 9/31  brain]
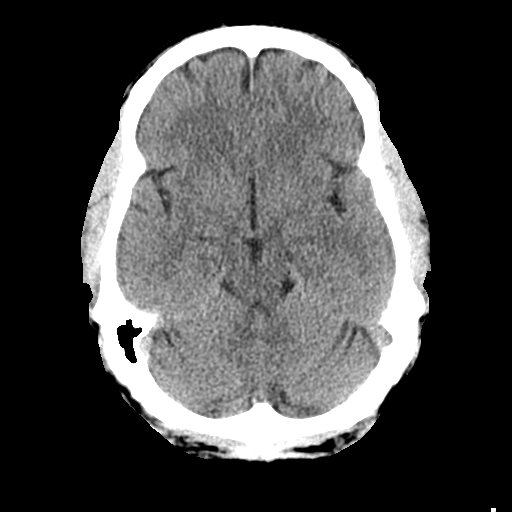
[im 12/31  brain]
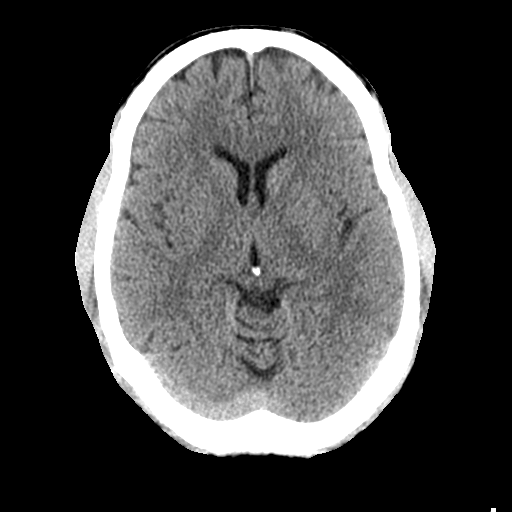
[im 16/31  brain]
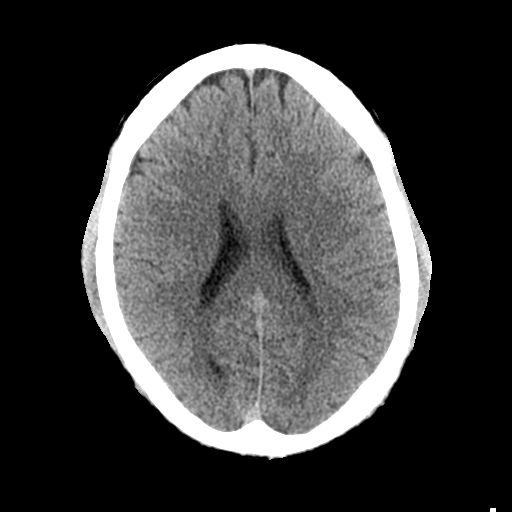
[im 16/31  bone]
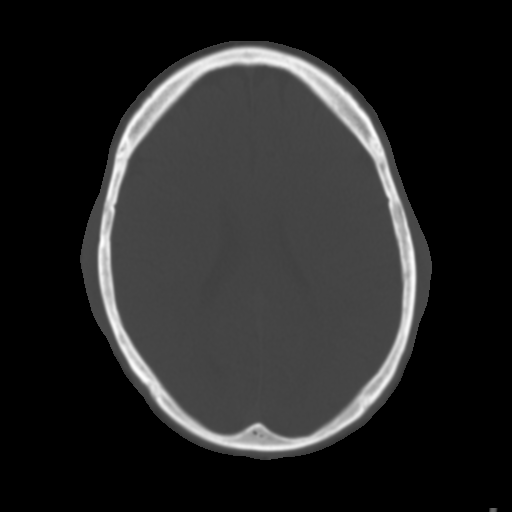
[im 19/31  brain]
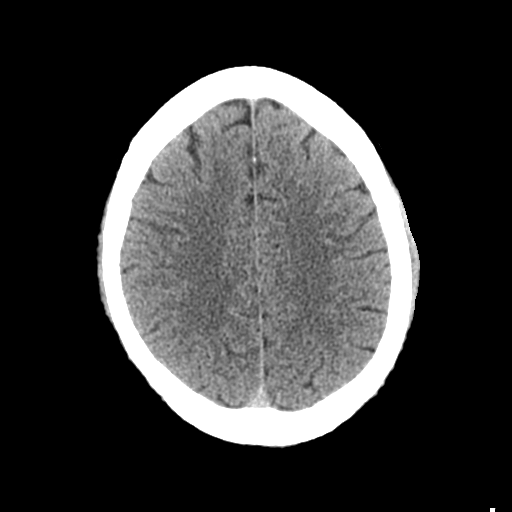
[im 22/31  brain]
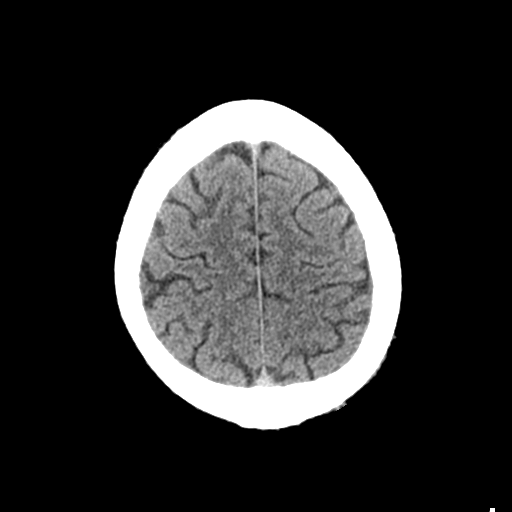
[im 25/31  brain]
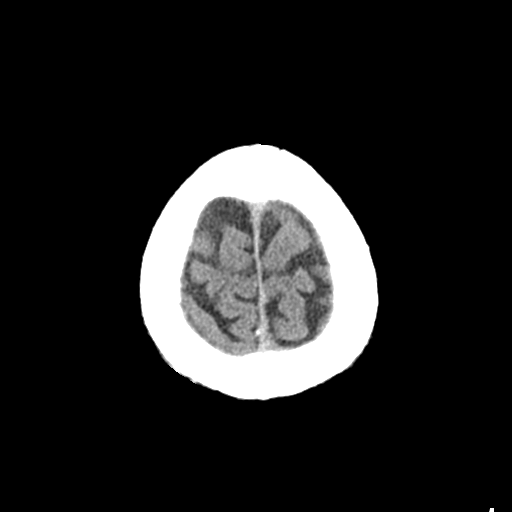
[im 28/31  brain]
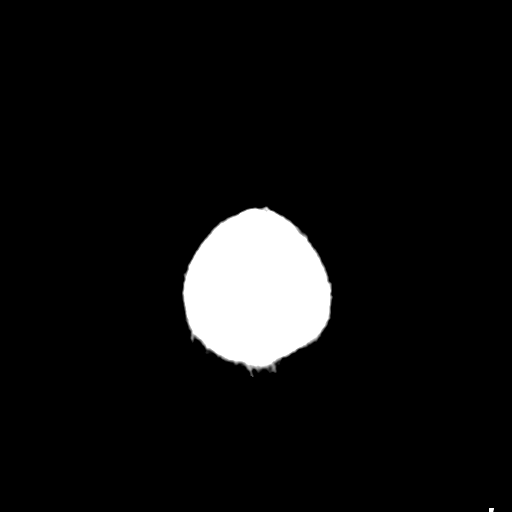
[im 28/31  bone]
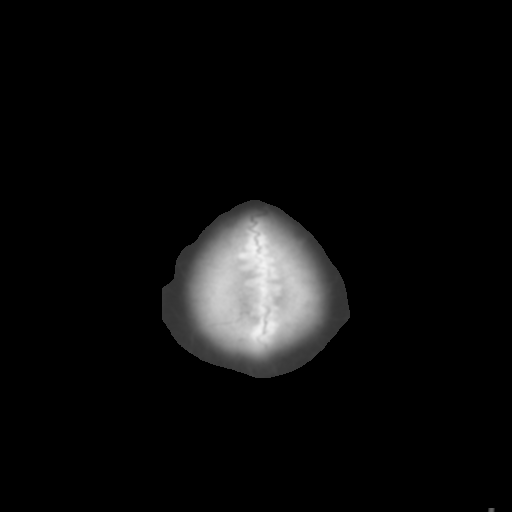

[Series 4: coronal soft tissue · coronal · 0.31mm/px · 3 of 65 slices shown]
[im 22/65  brain]
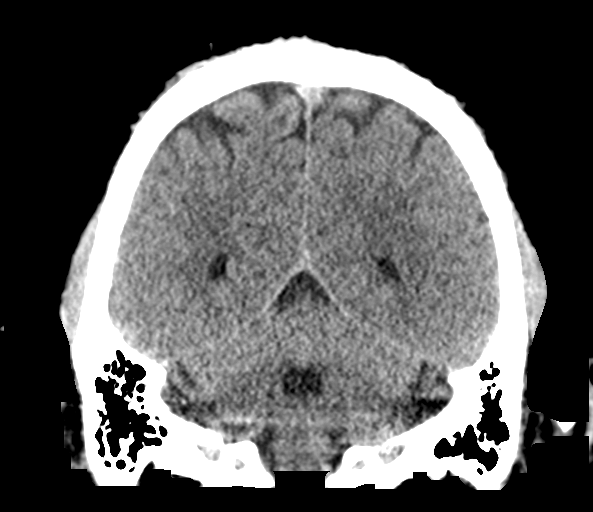
[im 29/65  brain]
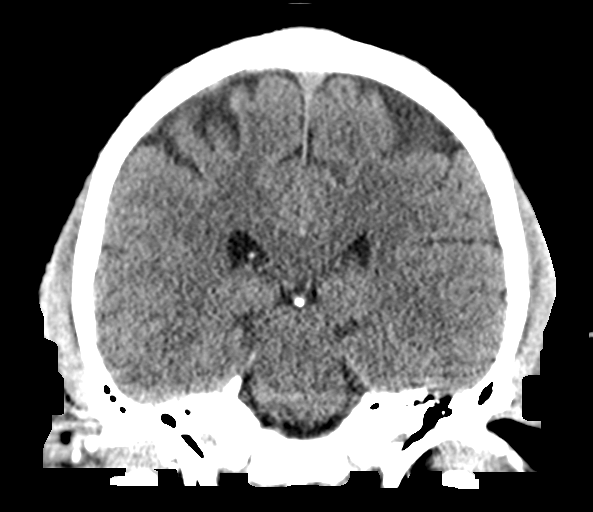
[im 36/65  brain]
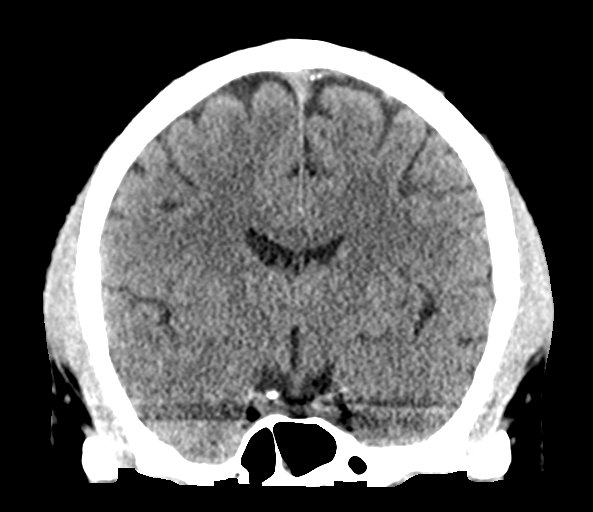

[Series 5: sagittal soft tissue · sagittal · 0.31mm/px · 3 of 57 slices shown]
[im 19/57  brain]
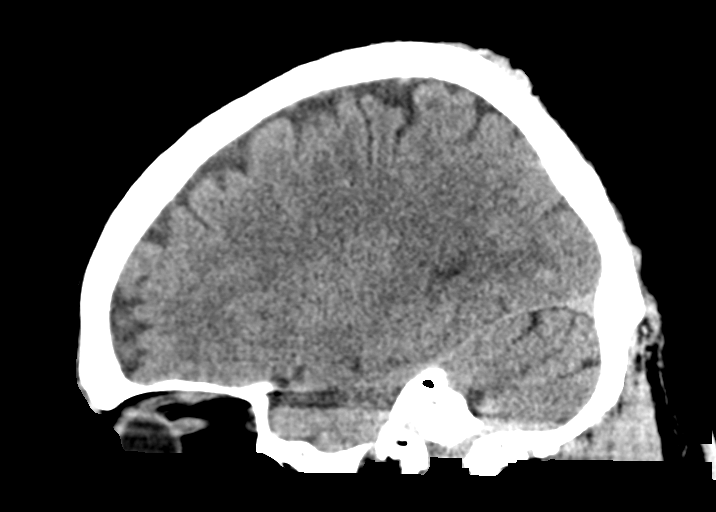
[im 29/57  brain]
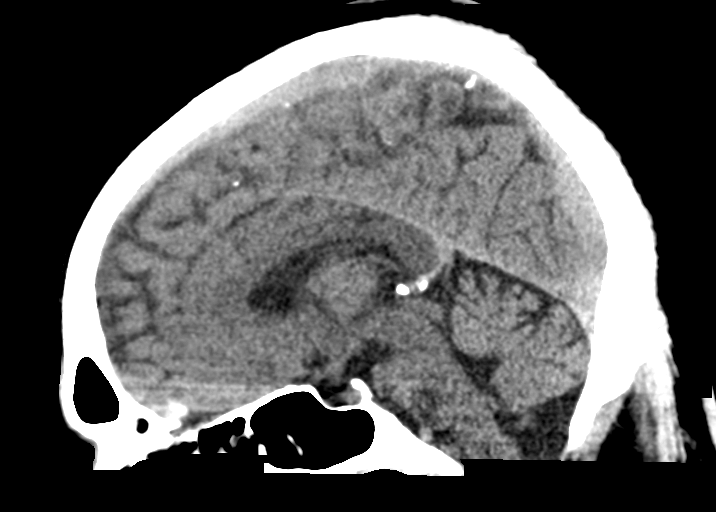
[im 38/57  brain]
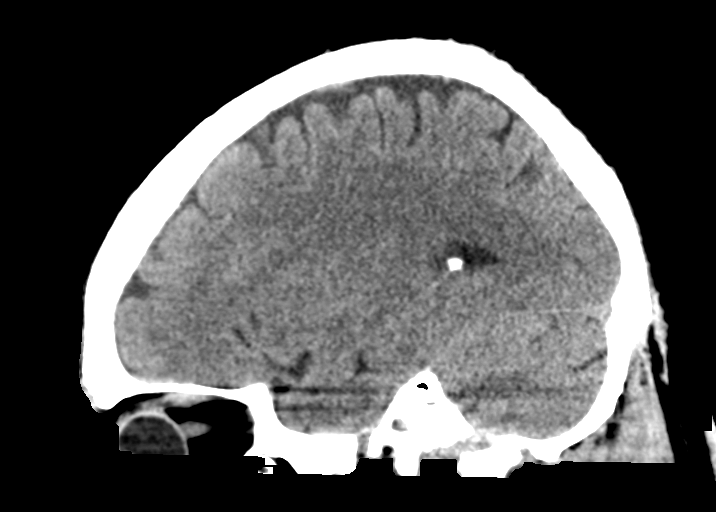

[15 of 47 positions shown; findings below may reference images not displayed]

FINDINGS: Brain:

No evidence of large-territorial acute infarction. No parenchymal
hemorrhage. No mass lesion. No extra-axial collection.

No mass effect or midline shift. No hydrocephalus. Basilar cisterns
are patent.

Vascular: No hyperdense vessel.

Skull: No acute fracture or focal lesion.

Sinuses/Orbits: Paranasal sinuses and mastoid air cells are clear.
The orbits are unremarkable.

Other: None.
IMPRESSION: No acute intracranial abnormality. If high clinical suspicion for
acute infarction, please consider MRI brain noncontrast for more
sensitive evaluation.

## 2022-06-03 DIAGNOSIS — F1020 Alcohol dependence, uncomplicated: Secondary | ICD-10-CM | POA: Diagnosis not present

## 2022-06-03 DIAGNOSIS — R69 Illness, unspecified: Secondary | ICD-10-CM | POA: Diagnosis not present

## 2022-06-04 DIAGNOSIS — F1020 Alcohol dependence, uncomplicated: Secondary | ICD-10-CM | POA: Diagnosis not present

## 2022-06-04 DIAGNOSIS — R69 Illness, unspecified: Secondary | ICD-10-CM | POA: Diagnosis not present

## 2022-06-05 DIAGNOSIS — R69 Illness, unspecified: Secondary | ICD-10-CM | POA: Diagnosis not present

## 2022-06-05 DIAGNOSIS — F1020 Alcohol dependence, uncomplicated: Secondary | ICD-10-CM | POA: Diagnosis not present

## 2022-06-07 DIAGNOSIS — F1020 Alcohol dependence, uncomplicated: Secondary | ICD-10-CM | POA: Diagnosis not present

## 2022-06-07 DIAGNOSIS — F1420 Cocaine dependence, uncomplicated: Secondary | ICD-10-CM | POA: Diagnosis not present

## 2022-06-07 DIAGNOSIS — R69 Illness, unspecified: Secondary | ICD-10-CM | POA: Diagnosis not present

## 2022-06-08 DIAGNOSIS — R69 Illness, unspecified: Secondary | ICD-10-CM | POA: Diagnosis not present

## 2022-06-08 DIAGNOSIS — F1420 Cocaine dependence, uncomplicated: Secondary | ICD-10-CM | POA: Diagnosis not present

## 2022-06-09 DIAGNOSIS — F1420 Cocaine dependence, uncomplicated: Secondary | ICD-10-CM | POA: Diagnosis not present

## 2022-06-09 DIAGNOSIS — F1020 Alcohol dependence, uncomplicated: Secondary | ICD-10-CM | POA: Diagnosis not present

## 2022-06-12 DIAGNOSIS — F1020 Alcohol dependence, uncomplicated: Secondary | ICD-10-CM | POA: Diagnosis not present

## 2022-06-12 DIAGNOSIS — F1420 Cocaine dependence, uncomplicated: Secondary | ICD-10-CM | POA: Diagnosis not present

## 2022-06-13 DIAGNOSIS — F1020 Alcohol dependence, uncomplicated: Secondary | ICD-10-CM | POA: Diagnosis not present

## 2022-06-13 DIAGNOSIS — F1420 Cocaine dependence, uncomplicated: Secondary | ICD-10-CM | POA: Diagnosis not present

## 2022-06-14 DIAGNOSIS — F1020 Alcohol dependence, uncomplicated: Secondary | ICD-10-CM | POA: Diagnosis not present

## 2022-06-14 DIAGNOSIS — F1420 Cocaine dependence, uncomplicated: Secondary | ICD-10-CM | POA: Diagnosis not present

## 2022-06-15 DIAGNOSIS — F1020 Alcohol dependence, uncomplicated: Secondary | ICD-10-CM | POA: Diagnosis not present

## 2022-06-15 DIAGNOSIS — F1420 Cocaine dependence, uncomplicated: Secondary | ICD-10-CM | POA: Diagnosis not present

## 2022-06-16 DIAGNOSIS — F1420 Cocaine dependence, uncomplicated: Secondary | ICD-10-CM | POA: Diagnosis not present

## 2022-06-16 DIAGNOSIS — F1020 Alcohol dependence, uncomplicated: Secondary | ICD-10-CM | POA: Diagnosis not present

## 2022-06-19 DIAGNOSIS — F1020 Alcohol dependence, uncomplicated: Secondary | ICD-10-CM | POA: Diagnosis not present

## 2022-06-19 DIAGNOSIS — F1420 Cocaine dependence, uncomplicated: Secondary | ICD-10-CM | POA: Diagnosis not present

## 2022-06-20 DIAGNOSIS — F1420 Cocaine dependence, uncomplicated: Secondary | ICD-10-CM | POA: Diagnosis not present

## 2022-06-20 DIAGNOSIS — F1020 Alcohol dependence, uncomplicated: Secondary | ICD-10-CM | POA: Diagnosis not present

## 2022-06-21 DIAGNOSIS — F5101 Primary insomnia: Secondary | ICD-10-CM | POA: Diagnosis not present

## 2022-06-21 DIAGNOSIS — F1020 Alcohol dependence, uncomplicated: Secondary | ICD-10-CM | POA: Diagnosis not present

## 2022-06-21 DIAGNOSIS — F1420 Cocaine dependence, uncomplicated: Secondary | ICD-10-CM | POA: Diagnosis not present

## 2022-06-21 DIAGNOSIS — F320 Major depressive disorder, single episode, mild: Secondary | ICD-10-CM | POA: Diagnosis not present

## 2022-06-22 DIAGNOSIS — F1020 Alcohol dependence, uncomplicated: Secondary | ICD-10-CM | POA: Diagnosis not present

## 2022-06-22 DIAGNOSIS — F320 Major depressive disorder, single episode, mild: Secondary | ICD-10-CM | POA: Diagnosis not present

## 2022-06-22 DIAGNOSIS — F5101 Primary insomnia: Secondary | ICD-10-CM | POA: Diagnosis not present

## 2022-06-22 DIAGNOSIS — F1420 Cocaine dependence, uncomplicated: Secondary | ICD-10-CM | POA: Diagnosis not present

## 2022-06-23 DIAGNOSIS — F1020 Alcohol dependence, uncomplicated: Secondary | ICD-10-CM | POA: Diagnosis not present

## 2022-06-23 DIAGNOSIS — F320 Major depressive disorder, single episode, mild: Secondary | ICD-10-CM | POA: Diagnosis not present

## 2022-06-23 DIAGNOSIS — F5101 Primary insomnia: Secondary | ICD-10-CM | POA: Diagnosis not present

## 2022-06-23 DIAGNOSIS — F1420 Cocaine dependence, uncomplicated: Secondary | ICD-10-CM | POA: Diagnosis not present

## 2022-06-26 DIAGNOSIS — F5101 Primary insomnia: Secondary | ICD-10-CM | POA: Diagnosis not present

## 2022-06-26 DIAGNOSIS — F1020 Alcohol dependence, uncomplicated: Secondary | ICD-10-CM | POA: Diagnosis not present

## 2022-06-26 DIAGNOSIS — F1420 Cocaine dependence, uncomplicated: Secondary | ICD-10-CM | POA: Diagnosis not present

## 2022-06-26 DIAGNOSIS — F320 Major depressive disorder, single episode, mild: Secondary | ICD-10-CM | POA: Diagnosis not present

## 2022-06-27 DIAGNOSIS — F5101 Primary insomnia: Secondary | ICD-10-CM | POA: Diagnosis not present

## 2022-06-27 DIAGNOSIS — F320 Major depressive disorder, single episode, mild: Secondary | ICD-10-CM | POA: Diagnosis not present

## 2022-06-27 DIAGNOSIS — F1420 Cocaine dependence, uncomplicated: Secondary | ICD-10-CM | POA: Diagnosis not present

## 2022-06-27 DIAGNOSIS — F1020 Alcohol dependence, uncomplicated: Secondary | ICD-10-CM | POA: Diagnosis not present

## 2022-06-28 DIAGNOSIS — F1020 Alcohol dependence, uncomplicated: Secondary | ICD-10-CM | POA: Diagnosis not present

## 2022-06-28 DIAGNOSIS — F1420 Cocaine dependence, uncomplicated: Secondary | ICD-10-CM | POA: Diagnosis not present

## 2022-06-28 DIAGNOSIS — F5101 Primary insomnia: Secondary | ICD-10-CM | POA: Diagnosis not present

## 2022-06-28 DIAGNOSIS — F320 Major depressive disorder, single episode, mild: Secondary | ICD-10-CM | POA: Diagnosis not present

## 2022-06-29 DIAGNOSIS — F1020 Alcohol dependence, uncomplicated: Secondary | ICD-10-CM | POA: Diagnosis not present

## 2022-06-29 DIAGNOSIS — F5101 Primary insomnia: Secondary | ICD-10-CM | POA: Diagnosis not present

## 2022-06-29 DIAGNOSIS — F320 Major depressive disorder, single episode, mild: Secondary | ICD-10-CM | POA: Diagnosis not present

## 2022-06-29 DIAGNOSIS — F1420 Cocaine dependence, uncomplicated: Secondary | ICD-10-CM | POA: Diagnosis not present

## 2022-06-30 DIAGNOSIS — F1020 Alcohol dependence, uncomplicated: Secondary | ICD-10-CM | POA: Diagnosis not present

## 2022-06-30 DIAGNOSIS — F5101 Primary insomnia: Secondary | ICD-10-CM | POA: Diagnosis not present

## 2022-06-30 DIAGNOSIS — F320 Major depressive disorder, single episode, mild: Secondary | ICD-10-CM | POA: Diagnosis not present

## 2022-06-30 DIAGNOSIS — F1420 Cocaine dependence, uncomplicated: Secondary | ICD-10-CM | POA: Diagnosis not present

## 2022-07-03 DIAGNOSIS — F320 Major depressive disorder, single episode, mild: Secondary | ICD-10-CM | POA: Diagnosis not present

## 2022-07-03 DIAGNOSIS — F5101 Primary insomnia: Secondary | ICD-10-CM | POA: Diagnosis not present

## 2022-07-03 DIAGNOSIS — F1420 Cocaine dependence, uncomplicated: Secondary | ICD-10-CM | POA: Diagnosis not present

## 2022-07-03 DIAGNOSIS — F1020 Alcohol dependence, uncomplicated: Secondary | ICD-10-CM | POA: Diagnosis not present

## 2022-08-01 DIAGNOSIS — K449 Diaphragmatic hernia without obstruction or gangrene: Secondary | ICD-10-CM | POA: Diagnosis not present

## 2022-08-01 DIAGNOSIS — K2970 Gastritis, unspecified, without bleeding: Secondary | ICD-10-CM | POA: Diagnosis not present

## 2022-08-01 DIAGNOSIS — N281 Cyst of kidney, acquired: Secondary | ICD-10-CM | POA: Diagnosis not present

## 2022-08-01 DIAGNOSIS — R0689 Other abnormalities of breathing: Secondary | ICD-10-CM | POA: Diagnosis not present

## 2022-08-01 DIAGNOSIS — R0600 Dyspnea, unspecified: Secondary | ICD-10-CM | POA: Diagnosis not present

## 2022-10-04 DIAGNOSIS — I639 Cerebral infarction, unspecified: Secondary | ICD-10-CM | POA: Diagnosis not present

## 2022-10-05 DIAGNOSIS — I517 Cardiomegaly: Secondary | ICD-10-CM | POA: Diagnosis not present

## 2022-10-05 DIAGNOSIS — I10 Essential (primary) hypertension: Secondary | ICD-10-CM | POA: Diagnosis not present

## 2022-10-05 DIAGNOSIS — R0689 Other abnormalities of breathing: Secondary | ICD-10-CM | POA: Diagnosis not present

## 2022-10-05 DIAGNOSIS — I69354 Hemiplegia and hemiparesis following cerebral infarction affecting left non-dominant side: Secondary | ICD-10-CM | POA: Diagnosis not present

## 2022-10-05 DIAGNOSIS — R531 Weakness: Secondary | ICD-10-CM | POA: Diagnosis not present

## 2022-10-05 DIAGNOSIS — Z7901 Long term (current) use of anticoagulants: Secondary | ICD-10-CM | POA: Diagnosis not present

## 2022-10-05 DIAGNOSIS — F17210 Nicotine dependence, cigarettes, uncomplicated: Secondary | ICD-10-CM | POA: Diagnosis not present

## 2022-10-05 DIAGNOSIS — Z7982 Long term (current) use of aspirin: Secondary | ICD-10-CM | POA: Diagnosis not present

## 2022-10-05 DIAGNOSIS — K649 Unspecified hemorrhoids: Secondary | ICD-10-CM | POA: Diagnosis not present

## 2022-10-05 DIAGNOSIS — F1490 Cocaine use, unspecified, uncomplicated: Secondary | ICD-10-CM | POA: Diagnosis not present

## 2022-10-05 DIAGNOSIS — Z7902 Long term (current) use of antithrombotics/antiplatelets: Secondary | ICD-10-CM | POA: Diagnosis not present

## 2022-10-05 DIAGNOSIS — J80 Acute respiratory distress syndrome: Secondary | ICD-10-CM | POA: Diagnosis not present

## 2022-10-05 DIAGNOSIS — H02402 Unspecified ptosis of left eyelid: Secondary | ICD-10-CM | POA: Diagnosis not present

## 2022-10-05 DIAGNOSIS — G4489 Other headache syndrome: Secondary | ICD-10-CM | POA: Diagnosis not present

## 2022-10-05 DIAGNOSIS — R4781 Slurred speech: Secondary | ICD-10-CM | POA: Diagnosis not present

## 2022-10-05 DIAGNOSIS — G510 Bell's palsy: Secondary | ICD-10-CM | POA: Diagnosis not present

## 2022-10-05 DIAGNOSIS — I639 Cerebral infarction, unspecified: Secondary | ICD-10-CM | POA: Diagnosis not present

## 2022-10-11 DIAGNOSIS — I639 Cerebral infarction, unspecified: Secondary | ICD-10-CM | POA: Diagnosis not present

## 2022-10-11 DIAGNOSIS — R2990 Unspecified symptoms and signs involving the nervous system: Secondary | ICD-10-CM | POA: Diagnosis not present

## 2022-10-11 DIAGNOSIS — I10 Essential (primary) hypertension: Secondary | ICD-10-CM | POA: Diagnosis not present

## 2022-10-11 DIAGNOSIS — F1490 Cocaine use, unspecified, uncomplicated: Secondary | ICD-10-CM | POA: Diagnosis not present

## 2022-10-12 DIAGNOSIS — R609 Edema, unspecified: Secondary | ICD-10-CM | POA: Diagnosis not present

## 2022-10-12 DIAGNOSIS — M7989 Other specified soft tissue disorders: Secondary | ICD-10-CM | POA: Diagnosis not present

## 2022-10-12 DIAGNOSIS — R202 Paresthesia of skin: Secondary | ICD-10-CM | POA: Diagnosis not present

## 2022-10-12 DIAGNOSIS — R200 Anesthesia of skin: Secondary | ICD-10-CM | POA: Diagnosis not present

## 2022-10-12 DIAGNOSIS — M79661 Pain in right lower leg: Secondary | ICD-10-CM | POA: Diagnosis not present

## 2022-10-12 DIAGNOSIS — Z7982 Long term (current) use of aspirin: Secondary | ICD-10-CM | POA: Diagnosis not present

## 2022-10-12 DIAGNOSIS — L03115 Cellulitis of right lower limb: Secondary | ICD-10-CM | POA: Diagnosis not present

## 2022-11-04 DIAGNOSIS — R29818 Other symptoms and signs involving the nervous system: Secondary | ICD-10-CM | POA: Diagnosis not present

## 2022-11-04 DIAGNOSIS — R200 Anesthesia of skin: Secondary | ICD-10-CM | POA: Diagnosis not present

## 2022-11-04 DIAGNOSIS — G459 Transient cerebral ischemic attack, unspecified: Secondary | ICD-10-CM | POA: Diagnosis not present

## 2022-11-04 DIAGNOSIS — R001 Bradycardia, unspecified: Secondary | ICD-10-CM | POA: Diagnosis not present

## 2022-11-04 DIAGNOSIS — R29810 Facial weakness: Secondary | ICD-10-CM | POA: Diagnosis not present

## 2022-11-04 DIAGNOSIS — G4489 Other headache syndrome: Secondary | ICD-10-CM | POA: Diagnosis not present

## 2022-11-04 DIAGNOSIS — I10 Essential (primary) hypertension: Secondary | ICD-10-CM | POA: Diagnosis not present

## 2022-11-04 DIAGNOSIS — R519 Headache, unspecified: Secondary | ICD-10-CM | POA: Diagnosis not present

## 2022-11-04 DIAGNOSIS — Z7902 Long term (current) use of antithrombotics/antiplatelets: Secondary | ICD-10-CM | POA: Diagnosis not present

## 2022-11-04 DIAGNOSIS — I639 Cerebral infarction, unspecified: Secondary | ICD-10-CM | POA: Diagnosis not present

## 2022-11-04 DIAGNOSIS — R2990 Unspecified symptoms and signs involving the nervous system: Secondary | ICD-10-CM | POA: Diagnosis not present

## 2022-11-04 DIAGNOSIS — J80 Acute respiratory distress syndrome: Secondary | ICD-10-CM | POA: Diagnosis not present

## 2022-11-04 DIAGNOSIS — Z7982 Long term (current) use of aspirin: Secondary | ICD-10-CM | POA: Diagnosis not present

## 2022-11-04 DIAGNOSIS — R531 Weakness: Secondary | ICD-10-CM | POA: Diagnosis not present

## 2023-01-24 ENCOUNTER — Other Ambulatory Visit: Payer: Self-pay
